# Patient Record
Sex: Female | Born: 1940 | Race: White | Hispanic: No | Smoking: Former smoker
Health system: Southern US, Community
[De-identification: ages and names within clinical notes are randomized; demographics above are authoritative.]

## PROBLEM LIST (undated history)

## (undated) ENCOUNTER — Emergency Department (HOSPITAL_COMMUNITY): Payer: Medicare HMO | Source: Home / Self Care

## (undated) DIAGNOSIS — R112 Nausea with vomiting, unspecified: Secondary | ICD-10-CM

## (undated) DIAGNOSIS — I4891 Unspecified atrial fibrillation: Secondary | ICD-10-CM

## (undated) DIAGNOSIS — I5030 Unspecified diastolic (congestive) heart failure: Secondary | ICD-10-CM

## (undated) DIAGNOSIS — E782 Mixed hyperlipidemia: Secondary | ICD-10-CM

## (undated) DIAGNOSIS — Z9889 Other specified postprocedural states: Secondary | ICD-10-CM

## (undated) DIAGNOSIS — I10 Essential (primary) hypertension: Secondary | ICD-10-CM

## (undated) DIAGNOSIS — R51 Headache: Secondary | ICD-10-CM

## (undated) DIAGNOSIS — R519 Headache, unspecified: Secondary | ICD-10-CM

## (undated) DIAGNOSIS — R079 Chest pain, unspecified: Secondary | ICD-10-CM

## (undated) DIAGNOSIS — D649 Anemia, unspecified: Secondary | ICD-10-CM

## (undated) DIAGNOSIS — K219 Gastro-esophageal reflux disease without esophagitis: Secondary | ICD-10-CM

## (undated) DIAGNOSIS — C55 Malignant neoplasm of uterus, part unspecified: Secondary | ICD-10-CM

## (undated) DIAGNOSIS — K5790 Diverticulosis of intestine, part unspecified, without perforation or abscess without bleeding: Secondary | ICD-10-CM

## (undated) DIAGNOSIS — N186 End stage renal disease: Secondary | ICD-10-CM

## (undated) DIAGNOSIS — Z992 Dependence on renal dialysis: Secondary | ICD-10-CM

## (undated) DIAGNOSIS — G629 Polyneuropathy, unspecified: Secondary | ICD-10-CM

## (undated) HISTORY — DX: Anemia, unspecified: D64.9

## (undated) HISTORY — PX: APPENDECTOMY: SHX54

## (undated) HISTORY — PX: KNEE ARTHROSCOPY: SUR90

## (undated) HISTORY — PX: CHOLECYSTECTOMY: SHX55

## (undated) HISTORY — PX: TUBAL LIGATION: SHX77

## (undated) HISTORY — DX: Mixed hyperlipidemia: E78.2

## (undated) HISTORY — DX: Essential (primary) hypertension: I10

## (undated) HISTORY — PX: BREAST SURGERY: SHX581

## (undated) HISTORY — DX: Chest pain, unspecified: R07.9

## (undated) HISTORY — PX: ABDOMINAL HYSTERECTOMY: SHX81

## (undated) HISTORY — PX: COLONOSCOPY W/ BIOPSIES AND POLYPECTOMY: SHX1376

---

## 2005-03-07 ENCOUNTER — Ambulatory Visit (HOSPITAL_COMMUNITY): Admission: RE | Admit: 2005-03-07 | Discharge: 2005-03-07 | Payer: Self-pay | Admitting: Family Medicine

## 2005-03-24 ENCOUNTER — Other Ambulatory Visit: Admission: RE | Admit: 2005-03-24 | Discharge: 2005-03-24 | Payer: Self-pay | Admitting: Obstetrics and Gynecology

## 2005-04-03 ENCOUNTER — Ambulatory Visit (HOSPITAL_COMMUNITY): Admission: RE | Admit: 2005-04-03 | Discharge: 2005-04-03 | Payer: Self-pay | Admitting: Obstetrics and Gynecology

## 2005-04-13 ENCOUNTER — Encounter: Payer: Self-pay | Admitting: Obstetrics and Gynecology

## 2005-04-13 ENCOUNTER — Inpatient Hospital Stay (HOSPITAL_COMMUNITY): Admission: RE | Admit: 2005-04-13 | Discharge: 2005-04-15 | Payer: Self-pay | Admitting: Obstetrics and Gynecology

## 2005-08-02 ENCOUNTER — Emergency Department (HOSPITAL_COMMUNITY): Admission: EM | Admit: 2005-08-02 | Discharge: 2005-08-02 | Payer: Self-pay | Admitting: Emergency Medicine

## 2005-09-05 ENCOUNTER — Ambulatory Visit (HOSPITAL_COMMUNITY): Admission: RE | Admit: 2005-09-05 | Discharge: 2005-09-05 | Payer: Self-pay | Admitting: Orthopedic Surgery

## 2006-05-11 ENCOUNTER — Ambulatory Visit (HOSPITAL_COMMUNITY): Admission: RE | Admit: 2006-05-11 | Discharge: 2006-05-11 | Payer: Self-pay | Admitting: Obstetrics and Gynecology

## 2006-05-15 DIAGNOSIS — R079 Chest pain, unspecified: Secondary | ICD-10-CM

## 2006-05-15 HISTORY — DX: Chest pain, unspecified: R07.9

## 2006-05-29 ENCOUNTER — Observation Stay (HOSPITAL_COMMUNITY): Admission: EM | Admit: 2006-05-29 | Discharge: 2006-05-29 | Payer: Self-pay | Admitting: *Deleted

## 2007-09-20 ENCOUNTER — Ambulatory Visit (HOSPITAL_COMMUNITY): Admission: RE | Admit: 2007-09-20 | Discharge: 2007-09-20 | Payer: Self-pay | Admitting: Obstetrics and Gynecology

## 2007-10-01 ENCOUNTER — Other Ambulatory Visit: Admission: RE | Admit: 2007-10-01 | Discharge: 2007-10-01 | Payer: Self-pay | Admitting: Obstetrics and Gynecology

## 2008-10-21 ENCOUNTER — Ambulatory Visit (HOSPITAL_COMMUNITY): Admission: RE | Admit: 2008-10-21 | Discharge: 2008-10-21 | Payer: Self-pay | Admitting: Obstetrics and Gynecology

## 2010-05-27 DIAGNOSIS — C55 Malignant neoplasm of uterus, part unspecified: Secondary | ICD-10-CM | POA: Insufficient documentation

## 2010-05-27 DIAGNOSIS — K219 Gastro-esophageal reflux disease without esophagitis: Secondary | ICD-10-CM | POA: Insufficient documentation

## 2010-12-23 NOTE — H&P (Signed)
Sabrina Beck, Sabrina Beck NO.:  [id number]   MEDICAL RECORD NO.:  [id number]          PATIENT TYPE:  INP   LOCATION:  6529                         FACILITY:  [place]   PHYSICIAN:  Broadus John, MD DATE OF BIRTH:  08/28/1940   DATE OF ADMISSION:  05/29/2006  DATE OF DISCHARGE:                                HISTORY & PHYSICAL   Sabrina Beck is a 70 year old white women who is transferred from [facility] to [facility] for further evaluation of chest pain.   The patient, who has no past history of documented coronary artery disease,  presented to the emergency room at [facility] and was then transferred here  to [facility] with a history of chest pain which began approximately 24  hours ago. It awoke her from sleep. The chest pain is described as a  pressure in her upper substernal region. It does not radiate. It has been  associated with slight nausea but no diaphoresis or dyspnea. There are no  exacerbating or ameliorating factors. It appears not to be related to  position, activity, meals or respirations. It has continued unabated, though  in a low-grade fashion, throughout the course of the day. She has  experienced several briefer, milder episodes of this type of chest pain in  recent weeks, and reportedly underwent stress testing by Dr. Mathis Bud  approximately a week ago. The results are not noted at this time. Today's  episode is the longest and most intense of the episodes which she has  experienced.   As noted, the patient has no past history of cardiac disease, including no  history of chest pain, myocardial infarction, coronary artery disease,  congestive heart failure, or arrhythmia. She has a number of risk factors  for coronary artery disease including hypertension, dyslipidemia, and a  family history of coronary artery disease (her father suffered a myocardial  infarction at age 41). The patient has no history of diabetes mellitus  or  smoking.   She has a history of gastroesophageal reflux. In addition, she is status  post hysterectomy for endometrial cancer.   The patient's past medical history is otherwise unremarkable.   ALLERGIES:  None.   MEDICATIONS:  Nexium, Cozaar, enalapril, metoprolol, TriCor, and aspirin.   OPERATIONS:  Hysterectomy (as described above), cholecystectomy.   SOCIAL HISTORY:  The patient is widowed. She lives alone. She works in a  [employer]. She does not smoke cigarettes (as described above), and  she does not consume alcohol.   FAMILY HISTORY:  Notable for early coronary artery disease (father at age  68).   REVIEW OF SYSTEMS:  Reveals no new problems related to her head, eyes, ears,  nose, mouth, throat or lungs; gastrointestinal system; genitourinary system  or extremities. There is history of neurologic or psychiatric disorder.  There is no history of fevers, chills or weight loss.   PHYSICAL EXAMINATION:  Blood pressure 141/56. Pulse 71 and regular.  Respirations 16. Temperature 97.9.  The patient was an older white woman in no discomfort. She was alert,  oriented, appropriate and responsive.  HEENT:  Were normal. The neck was thyromegaly or adenopathy. Carotid pulses  were palpable bilaterally without bruits.  CARDIAC EXAMINATION:  Revealed a normal S1 and S2. There was no S3, S4,  murmur, rub, or click. Cardiac rhythm was regular. No chest wall tenderness  was noted.  LUNGS:  Clear.  ABDOMEN:  Was soft and nontender. There was no mass, hepatosplenomegaly,  bruit, distention, rebound, guarding, or rigidity. Bowel sounds were normal.  BREASTS, PELVIC, AND RECTAL EXAMINATIONS:  Were not performed as they were  not pertinent to the reason for acute care hospitalization.  EXTREMITIES:  Were without edema, deviation or deformity. Radial and  dorsalis pedis pulses were palpable bilaterally.  Brief screening neurological survey was unremarkable.   Electrocardiogram  was normal. The chest radiographs, according to the  radiologist, demonstrated no evidence of acute cardiopulmonary disease. The  initial set of cardiac markers revealed a myoglobin of 155, CK-MB 4.6, and  troponin less than 0.05. Potassium 3.9, BUN 24, and creatinine 1.1. White  count was 8.7 with a hemoglobin of 10.5 and hematocrit of 30.3.   [place] OF DICTATION - DOCUMENT [id number]      Broadus John, MD  Electronically Signed     MSC/MEDQ  D:  05/29/2006  T:  05/29/2006  Job:  [id number]

## 2010-12-23 NOTE — H&P (Signed)
NAME:  Sabrina Beck, Sabrina Beck                ACCOUNT NO.:  [id number]   MEDICAL RECORD NO.:  [id number]          PATIENT TYPE:  AMB   LOCATION:  DAY                           FACILITY:  [facility]   PHYSICIAN:  Jonnie Kind, M.D. DATE OF BIRTH:  [birth year]   DATE OF ADMISSION:  DATE OF DISCHARGE:  LH                                HISTORY & PHYSICAL   ADMISSION DIAGNOSIS:  Endometrial cancer, FIGO grade 1.   HISTORY OF PRESENT ILLNESS:  Postmenopausal 70 year old female who is  admitted for a TAH/BSO and pelvic washings for diagnosis of endometrial FIGO  grade 1 endometrial carcinoma.  Endometrial biopsy was performed on March 24, 2005 with microscopic evaluation showing sufficient atypia to raise the  question of atypical hyperplasia versus well-differentiated carcinoma.  Second opinion consultation confirms the diagnosis of well-differentiated  carcinoma.  CT scan of the abdomen and pelvis shows no lymphadenopathy,  normal size uterus, no adnexal abnormalities.  Additional laboratory  evaluations notable for hemoglobin 12, hematocrit 36, platelets 226,000, BUN  19, creatinine 0.9 with normal liver function tests.  Albumin is low at 3.2.  Interestingly urinalysis shows significant proteinuria.  It was noted that  she had made proteinuria, 3+  proteinuria noted early this year and she had  a history of protein in urine which she related had been worked up and no  pathologic abnormality identified.  The results showed approximately  proteinuria was impressive with total protein increased to 9867 g per 24  hours.  We will once again consider this evaluation but need to proceed with  hysterectomy regarding the ovarian cancer.   PAST MEDICAL HISTORY:  Benign.   PAST SURGICAL HISTORY:  1.  Tubal ligation in 1981 with cervical polypectomy.  2.  Breast biopsy x2, Dr. Romona Curls.  3.  Cholecystectomy, Dr. Romona Curls.   INJURIES:  None.   MEDICATIONS:  Nexium.   PHYSICAL EXAMINATION:  VITAL SIGNS:   Height 5 feet 4 inches, weight 190,  blood pressure 162/80.  GENERAL:  Healthy-appearing postmenopausal Caucasian female, alert and  oriented x3.  HEENT:  Pupils are equal, round and reactive.  NECK:  Supple.  CHEST:  Clear to auscultation.  ABDOMEN:  Nontender without masses or adenopathy.  GYN:  CT scan:  See HPI.  Pap smear Class I.  Uterus within normal limits.  Adnexa nontender.  EXTREMITIES:  Without clubbing or edema at this time.   PLAN:  Left total abdominal hysterectomy and bilateral salpingo-  oophorectomy, pelvic washings, April 13, 2005.      Jonnie Kind, M.D.  Electronically Signed     JVF/MEDQ  D:  04/12/2005  T:  04/13/2005  Job:  [id number]   cc:   Bonne Dolores, M.D.  [address] [place] [address]  Fax: [phone]

## 2010-12-23 NOTE — Op Note (Signed)
NAMENECIE, GRIDLEY                ACCOUNT NO.:  [id number]   MEDICAL RECORD NO.:  [id number]          PATIENT TYPE:  INP   LOCATION:  [id number]                          FACILITY:  [facility]   PHYSICIAN:  Jonnie Kind, M.D. DATE OF BIRTH:  [birth year]   DATE OF PROCEDURE:  04/13/2005  DATE OF DISCHARGE:  04/15/2005                                 OPERATIVE REPORT   PREOPERATIVE DIAGNOSIS:  Endometrial cancer, FIGO, grade 1.   POSTOPERATIVE DIAGNOSIS:  Endometrial cancer, FIGO, grade 1.   PROCEDURE:  Total abdominal hysterectomy, bilateral salpingo-oophorectomy.   SURGEON:  Jonnie Kind, M.D.   ASSISTANTPhilipp Ovens, CST. Whitt, CST.   ANESTHESIA:  General, Idacavage, C.R.N.A.   COMPLICATIONS:  None.   FINDINGS:  Small uterus, tubes and ovaries. Normal-appearing pelvic  structures.   DETAILS OF PROCEDURE:  The patient was taken operating room, prepped and  draped in the usual fashion for lower abdominal surgery. A vertical midline  lower abdominal incision was performed, with easy entry into the peritoneal  cavity through a midline incision in the lower abdomen. Bowel was mobilized  and pelvis identified. Pelvic washings were taken from the surfaces around  the uterus, tubes and ovaries. The omentum upper abdomen were inspected, and  there was no abnormality. Palpation of the side wall showed no suspicious  adenopathy. Bowel was packed away and attention to the pelvis then  performed. Kelly clamps were placed across each round ligament and tube and  ovary complex. Round ligaments were taken down on either side by clamping,  cutting and suture ligating. The bladder flap was developed anteriorly. The  infundibular pelvic ligament was isolated on either side, being careful to  identify the ureter from a retroperitoneal technique, then the IP ligaments  were clamped, cut and suture ligated on either side. The broad ligament was  skeletonized down to the uterine vessels and a curved  Heaney clamp used  crossclamp uterine vessels, with transection of 0 chromic suture ligature.  The upper and lower cardinal ligaments were serially clamped, cut and suture  ligated using a straight Heaney clamp, knife dissection and suture ligature.  Upon reaching the level of the cervical vaginal angle with the bladder flap  having been pushed down, we were able to enter the anterior peritoneum,  amputate the cervix off the vaginal cuff, then close the cuff with Aldridge  stitches at each lateral vaginal angle and then oversew the middle portion  of the cuff, resulting in good cuff closure. Hemostasis was satisfactory.  Peritoneum was loosely reapproximated with the cuff x1 suture, and washing  and irrigation of the abdomen showed no continued bleeding. Sponge and  needle counts were correct. Anterior  peritoneum closed with 2-0 chromic __ followed by continuous running 0 PDS  suture to close the fascia. Subcutaneous tissues were approximated 2-0  chromic. Staple closure of skin completed the procedure. The patient  tolerated the procedure well and going to the recovery room in stable  condition.      Jonnie Kind, M.D.  Electronically Signed     JVF/MEDQ  D:  04/25/2005  T:  04/26/2005  Job:  [id number]   cc:   Bonne Dolores, M.D.  Fax: [phone]

## 2010-12-23 NOTE — Op Note (Signed)
Sabrina Beck, Sabrina Beck                ACCOUNT NO.:  [id number]   MEDICAL RECORD NO.:  [id number]          PATIENT TYPE:  INP   LOCATION:  [id number]                          FACILITY:  [facility]   PHYSICIAN:  Jonnie Kind, M.D. DATE OF BIRTH:  07-20-1941   DATE OF PROCEDURE:  04/13/2005  DATE OF DISCHARGE:  04/15/2005                                 OPERATIVE REPORT   PREOPERATIVE DIAGNOSIS:  Endometrial cancer, FIGO grade 1.   POSTOPERATIVE DIAGNOSIS:  Endometrial cancer, FIGO grade 1.   PROCEDURE:  Total abdominal hysterectomy with bilateral salpingo-  oophorectomy, pelvic washings.   SURGEON:  Jonnie Kind, M.D.   ASSISTANTPhilipp Ovens, CST.   ANESTHESIA:  Idacavage, CRNA.   COMPLICATIONS:  None.   FINDINGS:  Small uterus. No evidence of intra-abdominal abnormality. No  pelvic nodularity identifiable.   DETAILS OF PROCEDURE:  The patient was taken to the operating room, prepped  and draped for lower abdominal surgery. Lower abdominal midline incision was  made. Foley catheter had been placed and vaginal prepping performed. Midline  lower abdominal incision with easy entry into the peritoneal cavity and  pelvis washings performed, promptly suctioned. These were placed to the  side. The Balfour retractor was positioned, bowel packed away, and attention  to the pelvis directed. Round ligaments were easily taken down bilaterally,  and infundibulopelvic ligament skeletonized, clamped, cut, and suture  ligated. Bladder flap was developed anteriorly. Uterine vessels on either  side were clamped, cut, and suture ligated, first the infundibulopelvic  ligaments on either side being careful to avoid the ureters, then the broad  ligament serially clamped, cut and suture ligated followed by upper and  lower cardinal ligaments. Upon reaching the level of the cervix, a curved  Heaney clamp was placed beneath the cervix partly on either side, clamped,  cut and suture ligated. The remainder  part of the cervical attachments were  released and the cuff closed the rest of the way using interrupted figure-of-  eight suture.  Hemostasis was quite good. Pelvis was reperitonealized. Bowel was  repositioned. [place] equipment removed. Anterior peritoneum closed with 2-0  chromic, the fascia closed with 0 Vicryl. Subcutaneous tissues closed with 2-  0 plain, and staple closure of the skin completed the procedure. Sponge and  needle counts were correct.      Jonnie Kind, M.D.  Electronically Signed     JVF/MEDQ  D:  04/27/2005  T:  04/27/2005  Job:  [id number]

## 2010-12-23 NOTE — Consult Note (Signed)
NAMESHOSHANNA, CORBAN                ACCOUNT NO.:  [id number]   MEDICAL RECORD NO.:  [id number]          PATIENT TYPE:  INP   LOCATION:  6529                         FACILITY:  [facility]   PHYSICIAN:  Tory Emerald. Benson Norway, MD    DATE OF BIRTH:  1940/10/07   DATE OF CONSULTATION:  DATE OF DISCHARGE:  05/29/2006                                   CONSULTATION   REFERRING PHYSICIAN:  Ulice Dash R. Einar Gip, MD   REASON FOR CONSULTATION:  Chest pain.   HISTORY OF PRESENT ILLNESS:  This is a 70 year old female with a past  medical history of hypertension, endometrial cancer status post hysterectomy  and status post cholecystectomy who is admitted to the hospital with  complaints of chest pressure.  The patient states that she had an acute  onset of chest pain which is described as a squeezing sensation that became  more frequent previously in the past for approximately the past year.  She  had fleeting episodes of this squeezing type of sensation that was not  associated with exertion or any types of p.o. intake subsequently.  She did  not pay any attention until this past Monday where it became more frequent  and sustained.  She subsequently presented to [facility] and was  transferred over to [facility] for further evaluation and  treatment.  The patient at this time did wake her up from sleep.  However,  she has had nocturnal awakenings in the past.  The patient states having a  prior history of gastroesophageal reflux disease where it manifested a  burning type of sensation.  She was started on Nexium with excellent  efficacy and she denies that there is any correlation between the  indigestion that she experienced prior to Nexium and her squeezing type of  sensation.  She underwent a cardiac evaluation and the Myoview is negative  for any evidence of myocardial infarction and additionally her cardiac  enzymes are negative for any abnormalities.  In the past she was evaluated  by Dr.  Mathis Bud where she underwent a stress test approximately a week ago  and apparently that is a negative result.   PAST MEDICAL HISTORY:  As stated above.   PAST SURGICAL HISTORY:  As stated above.   ALLERGIES:  No known drug allergies.   SOCIAL HISTORY:  The patient is widowed.  Lives alone.  No tobacco, alcohol  or illicit drug use.   FAMILY HISTORY:  Significant for coronary artery disease.   REVIEW OF SYSTEMS:  Negative for dizziness, shortness of breath, fevers,  chills, arthralgia, dysuria, dysphagia, dizziness, tinnitus, rashes or new  neurological or any neurological episodes.   PHYSICAL EXAMINATION:  Vital signs:  Blood pressure is 122/65.  Heart rate  is 73.  Respirations 20.  Pulse ox is 97%.  Temperature is 97.1.  Generally,  the patient is in no acute distress.  Alert and oriented.  HEENT:  Normocephalic, atraumatic.  Extraocular muscles intact.  Pupils equal, round  and reactive to light.  Neck is supple with no lymphadenopathy.  Lungs are  clear to  auscultation bilaterally.  Cardiovascular:  Regular rate and  rhythm.  Abdomen is obese, soft, nontender, nondistended.  No  hepatosplenomegaly.  Extremities:  No clubbing, cyanosis or edema.   MEDICATIONS:  1. Nexium.  2. Cozaar.  3. Enalapril.  4. Metoprolol.  5. Tricor.  6. Aspirin.   IMPRESSION:  1. Noncardiac chest pain.  2. Gastroesophageal reflux disease.  3. History of endometrial cancer.  4. Abnormal liver enzymes.  After evaluation of the patient it appears that she had noncardiac chest  pain and this can be from a GI source.  The patient does have relief with  Nexium at this time for her gastroesophageal reflux symptoms.  It may be  that there is some further irritation of acid presenting as chest pain.  It  would be prudent to start her on Nexium b.i.d. for approximately 2 weeks and  I will follow up with her in the office.  If her symptoms are resolved then  no further workup is necessary and she can  be maintained on Nexium and most  likely weaned down to once per day.  If her symptoms are not resolved,  further evaluation with an EDG and __________ will be necessary and pending  the results, a manometry may be performed.  Additionally, the patient is  noted to have a mildly elevated ALT.  I suspect that this is fatty lipid  disease.  However, further evaluation will be performed and this can be done  as an outpatient.  At this time she does not have any further chest pain.  I  will leave her discharge up to Dr. Einar Gip.   PLAN:  Will provide patient Nexium 40 mg p.o. b.i.d. and to follow up with  Dr. Benson Norway in the office in 2 weeks.      Tory Emerald Benson Norway, MD  Electronically Signed     PDH/MEDQ  D:  05/29/2006  T:  05/30/2006  Job:  [id number]   cc:   Eden Lathe. Einar Gip, MD

## 2010-12-23 NOTE — Discharge Summary (Signed)
NAMEMILKA, BUZZO                ACCOUNT NO.:  [id number]   MEDICAL RECORD NO.:  [id number]          PATIENT TYPE:  INP   LOCATION:  [id number]                          FACILITY:  [facility]   PHYSICIAN:  Jonnie Kind, M.D. DATE OF BIRTH:  10-21-1940   DATE OF ADMISSION:  04/13/2005  DATE OF DISCHARGE:  09/09/2006LH                                 DISCHARGE SUMMARY   PROCEDURES:  Total abdominal hysterectomy (68.4).  Bilateral salpingo-oophorectomy (65.61).   ADMISSION DIAGNOSES:  Endometrial cancer FIGO grade 1.   DISCHARGE DIAGNOSES:  Endometrial cancer FIGO grade 1.   PROCEDURES:  1.  Total abdominal hysterectomy bilateral salpingo-oophorectomy.  2.  Peritoneal washings.   HOSPITAL COURSE:  Sabrina Beck was admitted having had an outpatient biopsy  indicating well differentiating endometrial carcinoma.  She has a benign  medical history with history of tubal ligation and breast biopsy x2 and  cholecystectomy.  The patient was admitted and underwent surgery in  straightforward fashion on April 14, 2005.  Postoperatively, the patient  did well, was out of bed x2 the same day.  On September 9, she was doing  well, ambulatory, tolerating regular diet with stable postoperative course  and discharged home.   DISCHARGE MEDICATIONS:  1.  Tylox 1 q.4h p.r.n. pain.  2.  Nexium 1 p.o. daily for heartburn.  3.  Iron supplement one daily for anemia.   PLAN:  Follow up in one week.   ADDENDUM:  Pathology report showed a 70 g uterus with only 2 mm invasion at  a place where the myometrium was 1.3 cm thick.  There was no lymphatic  neurovascular invasion and no cervical involvement.  Peritoneal services  were negative and peritoneal washings were negative.  No lymph nodes were  resected.   The patient will be followed up for routine postop care with our office.     Jonnie Kind, M.D.  Electronically Signed    JVF/MEDQ  D:  05/02/2005  T:  05/03/2005  Job:  [id number]

## 2010-12-23 NOTE — Discharge Summary (Signed)
NAMENORALBA, ROSENCRANTZ                ACCOUNT NO.:  [id number]   MEDICAL RECORD NO.:  [id number]          PATIENT TYPE:  INP   LOCATION:  6529                         FACILITY:  [facility]   PHYSICIAN:  Leslye Peer, MD       DATE OF BIRTH:  17-Jun-1941   DATE OF ADMISSION:  05/29/2006  DATE OF DISCHARGE:  05/29/2006                               DISCHARGE SUMMARY   DISCHARGE DIAGNOSES:  1. Chest pain negative myocardial infarction, probably      gastrointestinal.  2. Hypertension.  3. Dyslipidemia.  4. Gastroesophageal reflux disease.  5. History of endometrial cancer with hysterectomy.  6. History of anemia.   DISCHARGE CONDITION:  Improved.   PROCEDURES:  None.   CONSULTATIONS:  GI with Dr. Carol Ada.   DISCHARGE MEDICATIONS:  1. Nexium 40 mg twice a day for 2 weeks.  2. Cozaar 50 mg daily.  3. Enalapril 10 mg daily.  4. Metoprolol tartrate 25 mg half a tablet twice a day.  5. Tricor 145 mg daily.  6. Aspirin 81 mg daily.  7. Vitamin C 1000 mg daily.  8. Hematinic Plus 1 daily.  9. Glucosamine chondroitin 2 tablets daily.   DISCHARGE INSTRUCTIONS:  Low-fat, low-salt diet.  Increase activity  slowly.  See Dr. Mathis Bud; the office will call with date and time.   HISTORY OF PRESENT ILLNESS:  A 70 year old female was transferred from  [facility] to [facility] on May 29, 2006 and admitted by  Dr. Chana Bode, on call for Dr. Mathis Bud secondary to chest pain.  The  chest pain awakened her from sleep, and it began approximately 24 hours  prior to the admission.  It was described as pressure in her upper  substernal region without radiation.  Has been associated with slight  nausea but no diaphoresis or dyspnea.  No exacerbating factors.  It had  continued unabated in a low-grade fashion throughout the course of the  day.  She had experienced several milder episodes in recent weeks and  underwent stress testing by Dr. Mathis Bud a week prior to the admission.  We did get  those results which were negative for ischemia.  The episode  that brought her to the hospital was the most intense episode.   The patient has no prior history of coronary disease.  She does have  risk factors, hypertension, dyslipidemia, and family history of coronary  disease.  Her father suffered an MI at age 96.  She does have  gastroesophageal reflux disease and a history of hysterectomy and  endometrial cancer.   ALLERGIES:  None.   OUTPATIENT MEDICATIONS:  Nexium, Cozaar, enalapril, metoprolol, Tricor,  and aspirin.   Family history, social history, review of systems, see history and  physical.   PHYSICAL EXAMINATION AT DISCHARGE:  Blood pressure 138/53, pulse 84 and  regular, temperature 98.5.  HEART:  Regular rate and rhythm, no gallop or murmur.  LUNGS:  Clear.  ABDOMEN:  Soft, nontender, positive bowel sounds.  HEENT:  Sinus rhythm.   LABORATORY DATA:  Reticulocyte count 1.3, RBC is 3.47, pro time 13.8,  INR  1, PTT 29, magnesium 2.  Cardiac enzymes:  CK's were elevated at 247  and 257, but MB's were negative at 3.4 and 2.9, Troponin-I was less than  0.01.  Total cholesterol 168, triglycerides 144, HDL 37, and LDL 102.  TSH 3.67, iron 71, TIBC 312, iron saturation 23, UIBC 241, B12 712,  ferritin 355, erythropoietin 7.6.   LABORATORY DATA AT :  Myoglobin was 155, MB 4.6, troponin less  than 0.05, BUN 24, creatinine 1.1, and potassium was 3.9.  Hemoglobin  10.5, hematocrit 30.   Chest x-ray:  No evidence of acute cardiopulmonary disease, and EKG was  normal.   HOSPITAL COURSE:  Ms. Darpino was transferred from [facility] due to  chest pain on May 29, 2006 and admitted by Dr. Ethelle Lyon on call  for Dr. Mathis Bud.  The patient had recently had a stress test with Dr.  Mathis Bud, Myoview, and it was negative for ischemia, completely normal  with normal EF.  Enzymes were negative.  CK's were elevated.  The  patient had complained of some dark stools, but  she was on iron, unable  to do a Hemoccult at the time.   Dr. Benson Norway was consulted as it was felt more GI related than cardiac.  He  saw her and increased her Nexium to twice a day which we did at  discharge, and she would follow up with Dr. Benson Norway in the office. Dr.  Einar Gip saw her and discharge her on May 29, 2006.      Otilio Carpen. Dorene Ar, N.P.    ______________________________  Leslye Peer, MD    LRI/MEDQ  D:  07/13/2006  T:  07/14/2006  Job:  [id number]   cc:   Tory Emerald. Benson Norway, MD  Leslye Peer, MD

## 2010-12-23 NOTE — H&P (Signed)
NAMEMILEYA, Sabrina Beck                ACCOUNT NO.:  [id number]   MEDICAL RECORD NO.:  [id number]          PATIENT TYPE:  INP   LOCATION:  6529                         FACILITY:  [facility]   PHYSICIAN:  Broadus John, MD DATE OF BIRTH:  [birth year]   DATE OF ADMISSION:  05/29/2006  DATE OF DISCHARGE:                                HISTORY & PHYSICAL   PLEASE COMBINE WITH FIRST HALF OF DICTATION - DOCUMENT [phone]   LABORATORY STUDIES:  The electrocardiogram was normal.  The chest radiograph, according to the  radiologist, demonstrated no evidence of acute cardiopulmonary disease.  Her  myoglobin was 155, CK-MB 4.6, and troponin less than 0.05.  Potassium was  3.9, BUN 24, and creatinine 1.1.  White count was 8.7 with a hemoglobin of  10.5 and a hematocrit of 30.3.  The remaining studies were pending at the  time of this dictation.   IMPRESSION:  1. Chest pain; rule out unstable angina.  2. Hypertension.  3. Dyslipidemia.  4. Gastroesophageal reflux.  5. Status post hysterectomy for endometrial cancer.  6. Anemia.   PLAN:  1. Telemetry.  2. Serial cardiac enzymes.  3. Aspirin.  4. Intravenous heparin.  5. Intravenous nitroglycerin.  6. Metoprolol.  7. Fasting lipid profile.  8. Further measures per Dr. Mathis Bud.      Broadus John, MD  Electronically Signed     MSC/MEDQ  D:  05/29/2006  T:  05/29/2006  Job:  [id number]   cc:   Leslye Peer, MD

## 2011-03-13 ENCOUNTER — Encounter (HOSPITAL_COMMUNITY): Payer: Medicare PPO | Attending: Nephrology

## 2011-03-13 VITALS — BP 111/69 | HR 80

## 2011-03-13 DIAGNOSIS — N289 Disorder of kidney and ureter, unspecified: Secondary | ICD-10-CM

## 2011-03-13 DIAGNOSIS — D649 Anemia, unspecified: Secondary | ICD-10-CM | POA: Insufficient documentation

## 2011-03-13 MED ORDER — EPOETIN ALFA 10000 UNIT/ML IJ SOLN
2000.0000 [IU] | Freq: Once | INTRAMUSCULAR | Status: AC
  Administered 2011-03-13: 2000 [IU] via SUBCUTANEOUS

## 2011-03-13 MED ORDER — EPOETIN ALFA 10000 UNIT/ML IJ SOLN
INTRAMUSCULAR | Status: AC
  Filled 2011-03-13: qty 1

## 2011-03-13 NOTE — Progress Notes (Signed)
Procrit 2000units subq left lower abdomen. Tolerated well.

## 2011-03-27 ENCOUNTER — Encounter (HOSPITAL_COMMUNITY): Payer: Medicare PPO

## 2011-03-27 ENCOUNTER — Encounter (HOSPITAL_COMMUNITY): Payer: Self-pay

## 2011-03-27 VITALS — BP 129/71 | HR 93 | Temp 98.1°F

## 2011-03-27 DIAGNOSIS — D649 Anemia, unspecified: Secondary | ICD-10-CM

## 2011-03-27 HISTORY — DX: Anemia, unspecified: D64.9

## 2011-03-27 MED ORDER — EPOETIN ALFA 10000 UNIT/ML IJ SOLN: 2000.0000 [IU] | INTRAMUSCULAR | Status: DC

## 2011-03-27 MED ORDER — EPOETIN ALFA 10000 UNIT/ML IJ SOLN
INTRAMUSCULAR | Status: AC
  Filled 2011-03-27: qty 1

## 2011-03-27 MED ORDER — EPOETIN ALFA 10000 UNIT/ML IJ SOLN
2000.0000 [IU] | Freq: Once | INTRAMUSCULAR | Status: AC
  Administered 2011-03-27: 2000 [IU] via SUBCUTANEOUS

## 2011-03-27 NOTE — Progress Notes (Signed)
VSS. Procrit  2000 units given subcutaneous to lower left abd.  Tolerated well.

## 2011-04-11 ENCOUNTER — Encounter (HOSPITAL_COMMUNITY): Payer: Medicare PPO | Attending: Nephrology

## 2011-04-11 VITALS — BP 122/70 | HR 84

## 2011-04-11 DIAGNOSIS — D649 Anemia, unspecified: Secondary | ICD-10-CM

## 2011-04-11 LAB — RENAL FUNCTION PANEL
Albumin: 3.5 g/dL (ref 3.5–5.2)
BUN: 43 mg/dL — ABNORMAL HIGH (ref 6–23)
Calcium: 8.6 mg/dL (ref 8.4–10.5)
Creatinine, Ser: 2.59 mg/dL — ABNORMAL HIGH (ref 0.50–1.10)
GFR calc non Af Amer: 18 mL/min — ABNORMAL LOW (ref >60)

## 2011-04-11 MED ORDER — EPOETIN ALFA 10000 UNIT/ML IJ SOLN
INTRAMUSCULAR | Status: AC
  Administered 2011-04-11: 2000 [IU] via INTRAVENOUS
  Filled 2011-04-11: qty 1

## 2011-04-11 MED ORDER — EPOETIN ALFA 10000 UNIT/ML IJ SOLN
2000.0000 [IU] | Freq: Once | INTRAMUSCULAR | Status: AC
  Administered 2011-04-11: 2000 [IU] via INTRAVENOUS

## 2011-04-11 NOTE — Progress Notes (Signed)
Labs faxed to Dr.Befekadu at 1542pm today.

## 2011-04-25 ENCOUNTER — Encounter (HOSPITAL_COMMUNITY): Payer: Medicare PPO

## 2011-04-25 VITALS — BP 134/89 | HR 94

## 2011-04-25 DIAGNOSIS — D649 Anemia, unspecified: Secondary | ICD-10-CM

## 2011-04-25 MED ORDER — EPOETIN ALFA 10000 UNIT/ML IJ SOLN
INTRAMUSCULAR | Status: AC
  Administered 2011-04-25: 2000 [IU] via SUBCUTANEOUS
  Filled 2011-04-25: qty 1

## 2011-04-25 MED ORDER — EPOETIN ALFA 10000 UNIT/ML IJ SOLN
2000.0000 [IU] | Freq: Once | INTRAMUSCULAR | Status: AC
  Administered 2011-04-25: 2000 [IU] via SUBCUTANEOUS

## 2011-04-25 NOTE — Progress Notes (Signed)
Sabrina Beck presents today for injection per MD orders. Procrit 2000 units administered SQ in left Abdomen. Administration without incident. Patient tolerated well.

## 2011-05-09 ENCOUNTER — Encounter (HOSPITAL_COMMUNITY): Payer: Medicare PPO | Attending: Nephrology

## 2011-05-09 VITALS — BP 114/76 | HR 86

## 2011-05-09 DIAGNOSIS — D649 Anemia, unspecified: Secondary | ICD-10-CM

## 2011-05-09 DIAGNOSIS — N289 Disorder of kidney and ureter, unspecified: Secondary | ICD-10-CM

## 2011-05-09 DIAGNOSIS — N184 Chronic kidney disease, stage 4 (severe): Secondary | ICD-10-CM | POA: Insufficient documentation

## 2011-05-09 DIAGNOSIS — N189 Chronic kidney disease, unspecified: Secondary | ICD-10-CM | POA: Insufficient documentation

## 2011-05-09 LAB — RENAL FUNCTION PANEL
Albumin: 3.5 g/dL (ref 3.5–5.2)
BUN: 46 mg/dL — ABNORMAL HIGH (ref 6–23)
Chloride: 105 mEq/L (ref 96–112)
Potassium: 4.3 mEq/L (ref 3.5–5.1)
Sodium: 137 mEq/L (ref 135–145)

## 2011-05-09 LAB — HEMOGLOBIN AND HEMATOCRIT, BLOOD: Hemoglobin: 9.8 g/dL — ABNORMAL LOW (ref 12.0–15.0)

## 2011-05-09 MED ORDER — EPOETIN ALFA 10000 UNIT/ML IJ SOLN
2000.0000 [IU] | Freq: Once | INTRAMUSCULAR | Status: AC
  Administered 2011-05-09: 2000 [IU] via SUBCUTANEOUS

## 2011-05-09 MED ORDER — EPOETIN ALFA 10000 UNIT/ML IJ SOLN
INTRAMUSCULAR | Status: AC
  Administered 2011-05-09: 2000 [IU] via SUBCUTANEOUS
  Filled 2011-05-09: qty 1

## 2011-05-09 NOTE — Progress Notes (Signed)
Sabrina Beck presents today for injection per MD orders. Procrit 2,000 units administered SQ in left Abdomen. Administration without incident. Patient tolerated well.

## 2011-05-23 ENCOUNTER — Encounter (HOSPITAL_COMMUNITY): Payer: Medicare PPO

## 2011-05-23 DIAGNOSIS — N184 Chronic kidney disease, stage 4 (severe): Secondary | ICD-10-CM

## 2011-05-23 DIAGNOSIS — D649 Anemia, unspecified: Secondary | ICD-10-CM

## 2011-05-23 MED ORDER — EPOETIN ALFA 10000 UNIT/ML IJ SOLN
2000.0000 [IU] | Freq: Once | INTRAMUSCULAR | Status: AC
  Administered 2011-05-23: 2000 [IU] via SUBCUTANEOUS

## 2011-05-23 MED ORDER — EPOETIN ALFA 10000 UNIT/ML IJ SOLN
INTRAMUSCULAR | Status: AC
  Administered 2011-05-23: 2000 [IU] via SUBCUTANEOUS
  Filled 2011-05-23: qty 1

## 2011-05-23 NOTE — Progress Notes (Signed)
Sabrina Beck presents today for injection per MD orders. Procrit 2000 units administered SQ in left Abdomen. Administration without incident. Patient tolerated well.

## 2011-06-06 ENCOUNTER — Encounter (HOSPITAL_BASED_OUTPATIENT_CLINIC_OR_DEPARTMENT_OTHER): Payer: Medicare PPO

## 2011-06-06 VITALS — BP 129/76 | HR 78

## 2011-06-06 DIAGNOSIS — N189 Chronic kidney disease, unspecified: Secondary | ICD-10-CM

## 2011-06-06 DIAGNOSIS — D649 Anemia, unspecified: Secondary | ICD-10-CM

## 2011-06-06 LAB — RENAL FUNCTION PANEL
BUN: 52 mg/dL — ABNORMAL HIGH (ref 6–23)
CO2: 23 mEq/L (ref 19–32)
GFR calc Af Amer: 20 mL/min — ABNORMAL LOW (ref >90)
Glucose, Bld: 102 mg/dL — ABNORMAL HIGH (ref 70–99)
Potassium: 4.7 mEq/L (ref 3.5–5.1)
Sodium: 136 mEq/L (ref 135–145)

## 2011-06-06 MED ORDER — EPOETIN ALFA 10000 UNIT/ML IJ SOLN
INTRAMUSCULAR | Status: AC
  Administered 2011-06-06: 2000 [IU] via SUBCUTANEOUS
  Filled 2011-06-06: qty 1

## 2011-06-06 MED ORDER — EPOETIN ALFA 10000 UNIT/ML IJ SOLN
2000.0000 [IU] | Freq: Once | INTRAMUSCULAR | Status: AC
  Administered 2011-06-06: 2000 [IU] via SUBCUTANEOUS

## 2011-06-06 NOTE — Progress Notes (Signed)
Sabrina Beck presented for [employer]. Labs per MD order drawn via Peripheral Line 23 gauge needle inserted in rt ac. Lab drawn for renal panel and H&H. Procedure without incident.  Sabrina Beck presents today for injection per MD orders. Procrit 2000 units administered SQ in left Abdomen. Administration without incident. Patient tolerated well. Patient tolerated procedure well.

## 2011-06-20 ENCOUNTER — Encounter (HOSPITAL_COMMUNITY): Payer: Medicare PPO | Attending: Nephrology

## 2011-06-20 ENCOUNTER — Encounter (HOSPITAL_COMMUNITY): Payer: Medicare PPO

## 2011-06-20 DIAGNOSIS — D649 Anemia, unspecified: Secondary | ICD-10-CM

## 2011-06-20 MED ORDER — EPOETIN ALFA 10000 UNIT/ML IJ SOLN: 2000.0000 [IU] | Freq: Once | INTRAMUSCULAR | Status: DC

## 2011-06-20 MED ORDER — EPOETIN ALFA 10000 UNIT/ML IJ SOLN
INTRAMUSCULAR | Status: AC
  Administered 2011-06-20: 2000 [IU] via SUBCUTANEOUS
  Filled 2011-06-20: qty 1

## 2011-06-20 NOTE — Progress Notes (Signed)
Sabrina Beck presents today for injection per MD orders. Procrit 2000 units administered SQ in left Abdomen. Administration without incident. Patient tolerated well.

## 2011-07-04 ENCOUNTER — Encounter (INDEPENDENT_AMBULATORY_CARE_PROVIDER_SITE_OTHER): Payer: Medicare PPO

## 2011-07-04 DIAGNOSIS — D649 Anemia, unspecified: Secondary | ICD-10-CM

## 2011-07-04 LAB — RENAL FUNCTION PANEL
Albumin: 3.7 g/dL (ref 3.5–5.2)
BUN: 47 mg/dL — ABNORMAL HIGH (ref 6–23)
Creatinine, Ser: 2.68 mg/dL — ABNORMAL HIGH (ref 0.50–1.10)
GFR calc non Af Amer: 17 mL/min — ABNORMAL LOW (ref >90)
Phosphorus: 3.6 mg/dL (ref 2.3–4.6)

## 2011-07-04 LAB — HEMOGLOBIN AND HEMATOCRIT, BLOOD
HCT: 29 % — ABNORMAL LOW (ref 36.0–46.0)
Hemoglobin: 9.4 g/dL — ABNORMAL LOW (ref 12.0–15.0)

## 2011-07-04 MED ORDER — EPOETIN ALFA 10000 UNIT/ML IJ SOLN
INTRAMUSCULAR | Status: AC
  Filled 2011-07-04: qty 1

## 2011-07-04 MED ORDER — EPOETIN ALFA 10000 UNIT/ML IJ SOLN
2000.0000 [IU] | Freq: Once | INTRAMUSCULAR | Status: AC
  Administered 2011-07-04: 2000 [IU] via SUBCUTANEOUS

## 2011-07-04 NOTE — Progress Notes (Signed)
Sabrina Beck presents today for injection per MD orders. Procrit 2,000 units administered SQ in left Abdomen. Administration without incident. Patient tolerated well.

## 2011-07-06 ENCOUNTER — Telehealth (HOSPITAL_COMMUNITY): Payer: Self-pay | Admitting: Oncology

## 2011-07-06 ENCOUNTER — Other Ambulatory Visit (HOSPITAL_COMMUNITY): Payer: Self-pay | Admitting: Oncology

## 2011-07-18 ENCOUNTER — Ambulatory Visit (HOSPITAL_COMMUNITY): Payer: Medicare PPO

## 2011-07-24 ENCOUNTER — Telehealth (HOSPITAL_COMMUNITY): Payer: Self-pay | Admitting: Oncology

## 2011-08-04 ENCOUNTER — Encounter (HOSPITAL_COMMUNITY): Payer: Medicare Other | Attending: Nephrology

## 2011-08-04 DIAGNOSIS — D649 Anemia, unspecified: Secondary | ICD-10-CM | POA: Insufficient documentation

## 2011-09-21 ENCOUNTER — Encounter (HOSPITAL_COMMUNITY): Payer: Medicare Other | Attending: Nephrology

## 2011-09-21 DIAGNOSIS — D649 Anemia, unspecified: Secondary | ICD-10-CM

## 2011-09-21 MED ORDER — EPOETIN ALFA 10000 UNIT/ML IJ SOLN
6000.0000 [IU] | INTRAMUSCULAR | Status: DC
  Administered 2011-09-21: 6000 [IU] via INTRAVENOUS

## 2011-09-21 NOTE — Progress Notes (Signed)
Sabrina Beck presents today for injection per MD orders. Procrit 6,000units administered SQ in left Abdomen. Administration without incident. Patient tolerated well. Patient to get labs every 4 weeks, does not need them this shot but will need them with the next shot in two weeks, however, patient states that she sees Dr. Hinda Lenis in two weeks and they may draw labs then.  Patient instructed to inform us if they draw labs so we don't draw them again.  The concern is that insurance will not pay for the same labs so close.

## 2011-10-05 ENCOUNTER — Encounter (INDEPENDENT_AMBULATORY_CARE_PROVIDER_SITE_OTHER): Payer: Medicare Other

## 2011-10-05 VITALS — BP 117/76 | HR 84

## 2011-10-05 DIAGNOSIS — D649 Anemia, unspecified: Secondary | ICD-10-CM

## 2011-10-05 MED ORDER — EPOETIN ALFA 10000 UNIT/ML IJ SOLN
6000.0000 [IU] | Freq: Once | INTRAMUSCULAR | Status: AC
  Administered 2011-10-05: 6000 [IU] via SUBCUTANEOUS

## 2011-10-05 NOTE — Progress Notes (Signed)
Sabrina Beck presents today for injection per MD orders. Procrit 6000 units administered SQ in left Abdomen. Administration without incident. Patient tolerated well. Patient to have labs drawn every 4 weeks and she states they they were drawn one week ago at the doctor's office.

## 2011-10-19 ENCOUNTER — Encounter (HOSPITAL_COMMUNITY): Payer: Medicare Other | Attending: Nephrology

## 2011-10-19 VITALS — BP 120/80 | HR 89 | Temp 98.1°F

## 2011-10-19 DIAGNOSIS — D649 Anemia, unspecified: Secondary | ICD-10-CM | POA: Insufficient documentation

## 2011-10-19 LAB — RENAL FUNCTION PANEL
BUN: 53 mg/dL — ABNORMAL HIGH (ref 6–23)
CO2: 23 mEq/L (ref 19–32)
Calcium: 8.9 mg/dL (ref 8.4–10.5)
Chloride: 106 mEq/L (ref 96–112)
Creatinine, Ser: 3 mg/dL — ABNORMAL HIGH (ref 0.50–1.10)
GFR calc non Af Amer: 15 mL/min — ABNORMAL LOW (ref >90)
Glucose, Bld: 90 mg/dL (ref 70–99)

## 2011-10-19 MED ORDER — EPOETIN ALFA 10000 UNIT/ML IJ SOLN
6000.0000 [IU] | Freq: Once | INTRAMUSCULAR | Status: AC
  Administered 2011-10-19: 6000 [IU] via SUBCUTANEOUS

## 2011-10-19 NOTE — Progress Notes (Signed)
Sabrina Beck's reason for visit today is for an injection and labs as scheduled per MD orders.  Labs were drawn prior to administration of ordered medication.  Venipuncture performed with a 23 gauge butterfly needle to L Antecubital; she is due for labs again in 4 weeks according to MD order.  Labs faxed to Dr. Florentina Addison order.  Sabrina Beck also received procrit per MD orders; see [facility] for administration details.  Sabrina Beck tolerated all procedures well and without incident; questions were answered and patient was discharged.   Lab Results  Component Value Date   HGB 9.1* 10/19/2011

## 2011-11-02 ENCOUNTER — Encounter (INDEPENDENT_AMBULATORY_CARE_PROVIDER_SITE_OTHER): Payer: Medicare Other

## 2011-11-02 VITALS — BP 118/76 | HR 82

## 2011-11-02 DIAGNOSIS — D649 Anemia, unspecified: Secondary | ICD-10-CM

## 2011-11-02 MED ORDER — EPOETIN ALFA 10000 UNIT/ML IJ SOLN
6000.0000 [IU] | INTRAMUSCULAR | Status: DC
  Administered 2011-11-02: 6000 [IU] via SUBCUTANEOUS

## 2011-11-02 NOTE — Progress Notes (Signed)
Sabrina Beck presents today for injection per MD orders. Procrit 6,000 units administered SQ in left Abdomen. Administration without incident. Patient tolerated well.

## 2011-11-16 ENCOUNTER — Encounter (HOSPITAL_COMMUNITY): Payer: Medicare Other | Attending: Nephrology

## 2011-11-16 DIAGNOSIS — D649 Anemia, unspecified: Secondary | ICD-10-CM

## 2011-11-16 LAB — RENAL FUNCTION PANEL
CO2: 22 mEq/L (ref 19–32)
Chloride: 106 mEq/L (ref 96–112)
Creatinine, Ser: 3.61 mg/dL — ABNORMAL HIGH (ref 0.50–1.10)
GFR calc Af Amer: 13 mL/min — ABNORMAL LOW (ref >90)
GFR calc non Af Amer: 12 mL/min — ABNORMAL LOW (ref >90)
Sodium: 138 mEq/L (ref 135–145)

## 2011-11-16 LAB — HEMOGLOBIN AND HEMATOCRIT, BLOOD: HCT: 30.1 % — ABNORMAL LOW (ref 36.0–46.0)

## 2011-11-16 MED ORDER — EPOETIN ALFA 10000 UNIT/ML IJ SOLN
6000.0000 [IU] | Freq: Once | INTRAMUSCULAR | Status: AC
  Administered 2011-11-16: 6000 [IU] via INTRAVENOUS

## 2011-11-16 NOTE — Progress Notes (Signed)
Sabrina Beck presents today for injection per MD orders. Procrit 6,000 units administered SQ in left Abdomen. Administration without incident. Patient tolerated well.

## 2011-11-30 ENCOUNTER — Encounter (INDEPENDENT_AMBULATORY_CARE_PROVIDER_SITE_OTHER): Payer: Medicare Other

## 2011-11-30 VITALS — BP 110/75 | HR 109

## 2011-11-30 DIAGNOSIS — D649 Anemia, unspecified: Secondary | ICD-10-CM

## 2011-11-30 MED ORDER — EPOETIN ALFA 10000 UNIT/ML IJ SOLN
6000.0000 [IU] | Freq: Once | INTRAMUSCULAR | Status: AC
  Administered 2011-11-30: 6000 [IU] via SUBCUTANEOUS

## 2011-11-30 NOTE — Progress Notes (Signed)
Sabrina Beck presents today for injection per MD orders. Procrit 6000 units administered SQ in left Abdomen. Administration without incident. Patient tolerated well.

## 2011-12-14 ENCOUNTER — Encounter (HOSPITAL_COMMUNITY): Payer: Medicare Other | Attending: Nephrology

## 2011-12-14 DIAGNOSIS — D649 Anemia, unspecified: Secondary | ICD-10-CM

## 2011-12-14 LAB — HEMOGLOBIN AND HEMATOCRIT, BLOOD: Hemoglobin: 9.9 g/dL — ABNORMAL LOW (ref 12.0–15.0)

## 2011-12-14 LAB — RENAL FUNCTION PANEL
Albumin: 3.5 g/dL (ref 3.5–5.2)
BUN: 60 mg/dL — ABNORMAL HIGH (ref 6–23)
Creatinine, Ser: 3.68 mg/dL — ABNORMAL HIGH (ref 0.50–1.10)
Phosphorus: 3.8 mg/dL (ref 2.3–4.6)

## 2011-12-14 MED ORDER — EPOETIN ALFA 10000 UNIT/ML IJ SOLN
6000.0000 [IU] | Freq: Once | INTRAMUSCULAR | Status: AC
  Administered 2011-12-14: 6000 [IU] via SUBCUTANEOUS

## 2011-12-14 NOTE — Progress Notes (Signed)
Sabrina Beck presents today for injection per MD orders. Procrit 6000units administered SQ in left Abdomen. Administration without incident. Patient tolerated well. Labwork results faxed to Dr. Lowanda Foster.

## 2011-12-28 ENCOUNTER — Encounter (INDEPENDENT_AMBULATORY_CARE_PROVIDER_SITE_OTHER): Payer: Medicare Other

## 2011-12-28 VITALS — BP 122/65 | HR 89

## 2011-12-28 DIAGNOSIS — D649 Anemia, unspecified: Secondary | ICD-10-CM

## 2011-12-28 MED ORDER — EPOETIN ALFA 10000 UNIT/ML IJ SOLN: 10000.0000 [IU] | INTRAMUSCULAR | Status: DC

## 2011-12-28 MED ORDER — EPOETIN ALFA 10000 UNIT/ML IJ SOLN
10000.0000 [IU] | Freq: Once | INTRAMUSCULAR | Status: AC
  Administered 2011-12-28: 6000 [IU] via SUBCUTANEOUS

## 2011-12-28 NOTE — Progress Notes (Signed)
Tolerated injection well. 

## 2012-01-11 ENCOUNTER — Encounter (HOSPITAL_COMMUNITY): Payer: Medicare Other | Attending: Nephrology

## 2012-01-11 DIAGNOSIS — D649 Anemia, unspecified: Secondary | ICD-10-CM | POA: Insufficient documentation

## 2012-01-11 LAB — HEMOGLOBIN AND HEMATOCRIT, BLOOD
HCT: 27.5 % — ABNORMAL LOW (ref 36.0–46.0)
Hemoglobin: 8.9 g/dL — ABNORMAL LOW (ref 12.0–15.0)

## 2012-01-11 LAB — RENAL FUNCTION PANEL
BUN: 52 mg/dL — ABNORMAL HIGH (ref 6–23)
CO2: 19 mEq/L (ref 19–32)
Calcium: 8.6 mg/dL (ref 8.4–10.5)
Creatinine, Ser: 3.25 mg/dL — ABNORMAL HIGH (ref 0.50–1.10)
GFR calc non Af Amer: 13 mL/min — ABNORMAL LOW (ref >90)

## 2012-01-11 MED ORDER — EPOETIN ALFA 10000 UNIT/ML IJ SOLN
6000.0000 [IU] | Freq: Once | INTRAMUSCULAR | Status: AC
  Administered 2012-01-11: 6000 [IU] via SUBCUTANEOUS

## 2012-01-11 NOTE — Progress Notes (Signed)
Sabrina Beck presented for [employer]. Labs per MD order drawn via Peripheral Line 25 gauge needle inserted in lt arm   Procedure without incident.  Needle removed intact. Patient tolerated procedure well.  Sabrina Beck presents today for injection per MD orders. Procrit 6000 units administered SQ in left Abdomen. Administration without incident. Patient tolerated well.

## 2012-01-12 LAB — PTH, INTACT AND CALCIUM: PTH: 206.2 pg/mL — ABNORMAL HIGH (ref 14.0–72.0)

## 2012-01-12 LAB — VITAMIN D 25 HYDROXY (VIT D DEFICIENCY, FRACTURES): Vit D, 25-Hydroxy: 25 ng/mL — ABNORMAL LOW (ref 30–89)

## 2012-01-12 LAB — FERRITIN: Ferritin: 670 ng/mL — ABNORMAL HIGH (ref 10–291)

## 2012-01-12 LAB — IRON AND TIBC
Saturation Ratios: 43 % (ref 20–55)
TIBC: 351 ug/dL (ref 250–470)

## 2012-01-25 ENCOUNTER — Ambulatory Visit (HOSPITAL_COMMUNITY): Payer: Medicare Other

## 2012-01-30 ENCOUNTER — Encounter (INDEPENDENT_AMBULATORY_CARE_PROVIDER_SITE_OTHER): Payer: Medicare Other

## 2012-01-30 VITALS — BP 130/74 | HR 90

## 2012-01-30 DIAGNOSIS — D649 Anemia, unspecified: Secondary | ICD-10-CM

## 2012-01-30 MED ORDER — EPOETIN ALFA 10000 UNIT/ML IJ SOLN
8000.0000 [IU] | Freq: Once | INTRAMUSCULAR | Status: AC
  Administered 2012-01-30: 8000 [IU] via SUBCUTANEOUS

## 2012-01-30 NOTE — Progress Notes (Signed)
Sabrina Beck presents today for injection per MD orders. Procrit 8,000 units administered SQ in right Abdomen. Administration without incident. Patient tolerated well.

## 2012-02-13 ENCOUNTER — Encounter (HOSPITAL_COMMUNITY): Payer: Medicare Other | Attending: Nephrology

## 2012-02-13 VITALS — BP 126/68 | HR 86

## 2012-02-13 DIAGNOSIS — D649 Anemia, unspecified: Secondary | ICD-10-CM | POA: Insufficient documentation

## 2012-02-13 LAB — RENAL FUNCTION PANEL
Albumin: 3.5 g/dL (ref 3.5–5.2)
GFR calc Af Amer: 17 mL/min — ABNORMAL LOW (ref >90)
Glucose, Bld: 103 mg/dL — ABNORMAL HIGH (ref 70–99)
Phosphorus: 3.8 mg/dL (ref 2.3–4.6)
Potassium: 4.2 mEq/L (ref 3.5–5.1)
Sodium: 140 mEq/L (ref 135–145)

## 2012-02-13 MED ORDER — EPOETIN ALFA 10000 UNIT/ML IJ SOLN
8000.0000 [IU] | Freq: Once | INTRAMUSCULAR | Status: AC
  Administered 2012-02-13: 11:00:00 via SUBCUTANEOUS

## 2012-02-13 MED ORDER — EPOETIN ALFA 10000 UNIT/ML IJ SOLN: 8000.0000 [IU] | INTRAMUSCULAR | Status: DC

## 2012-02-13 NOTE — Progress Notes (Signed)
Tolerated injection well. 

## 2012-02-27 ENCOUNTER — Encounter (INDEPENDENT_AMBULATORY_CARE_PROVIDER_SITE_OTHER): Payer: Medicare Other

## 2012-02-27 DIAGNOSIS — D649 Anemia, unspecified: Secondary | ICD-10-CM

## 2012-02-27 MED ORDER — EPOETIN ALFA 10000 UNIT/ML IJ SOLN
8000.0000 [IU] | Freq: Once | INTRAMUSCULAR | Status: AC
  Administered 2012-02-27: 8000 [IU] via SUBCUTANEOUS

## 2012-02-27 NOTE — Progress Notes (Signed)
Sabrina Beck presents today for injection per the provider's orders.  Procrit administered administration without incident; see MAR for injection details.  Patient tolerated procedure well and without incident.  No questions or complaints noted at this time.  Patient is due to return in 2 weeks for labs and another injection.

## 2012-03-03 ENCOUNTER — Emergency Department (HOSPITAL_COMMUNITY)
Admission: EM | Admit: 2012-03-03 | Discharge: 2012-03-03 | Disposition: A | Discharge status: Home / Self Care | Payer: Medicare Other | Attending: Emergency Medicine | Admitting: Emergency Medicine

## 2012-03-03 ENCOUNTER — Emergency Department (HOSPITAL_COMMUNITY): Payer: Medicare Other

## 2012-03-03 ENCOUNTER — Encounter (HOSPITAL_COMMUNITY): Payer: Self-pay

## 2012-03-03 DIAGNOSIS — K5792 Diverticulitis of intestine, part unspecified, without perforation or abscess without bleeding: Secondary | ICD-10-CM

## 2012-03-03 DIAGNOSIS — K5732 Diverticulitis of large intestine without perforation or abscess without bleeding: Secondary | ICD-10-CM | POA: Insufficient documentation

## 2012-03-03 DIAGNOSIS — Z79899 Other long term (current) drug therapy: Secondary | ICD-10-CM | POA: Insufficient documentation

## 2012-03-03 DIAGNOSIS — I4891 Unspecified atrial fibrillation: Secondary | ICD-10-CM | POA: Insufficient documentation

## 2012-03-03 DIAGNOSIS — Z7901 Long term (current) use of anticoagulants: Secondary | ICD-10-CM | POA: Insufficient documentation

## 2012-03-03 DIAGNOSIS — Z87891 Personal history of nicotine dependence: Secondary | ICD-10-CM | POA: Insufficient documentation

## 2012-03-03 HISTORY — DX: Unspecified atrial fibrillation: I48.91

## 2012-03-03 LAB — URINALYSIS, ROUTINE W REFLEX MICROSCOPIC
Ketones, ur: NEGATIVE mg/dL
Leukocytes, UA: NEGATIVE
Nitrite: NEGATIVE
Protein, ur: 100 mg/dL — AB
pH: 6 (ref 5.0–8.0)

## 2012-03-03 LAB — CBC WITH DIFFERENTIAL/PLATELET
Basophils Absolute: 0 10*3/uL (ref 0.0–0.1)
Lymphocytes Relative: 23 % (ref 12–46)
Lymphs Abs: 2.6 10*3/uL (ref 0.7–4.0)
MCV: 96.8 fL (ref 78.0–100.0)
Neutro Abs: 7.8 10*3/uL — ABNORMAL HIGH (ref 1.7–7.7)
Neutrophils Relative %: 69 % (ref 43–77)
Platelets: 236 10*3/uL (ref 150–400)
RBC: 2.82 MIL/uL — ABNORMAL LOW (ref 3.87–5.11)
RDW: 14.6 % (ref 11.5–15.5)
WBC: 11.4 10*3/uL — ABNORMAL HIGH (ref 4.0–10.5)

## 2012-03-03 LAB — COMPREHENSIVE METABOLIC PANEL
ALT: 22 U/L (ref 0–35)
AST: 24 U/L (ref 0–37)
Alkaline Phosphatase: 46 U/L (ref 39–117)
CO2: 23 mEq/L (ref 19–32)
Chloride: 100 mEq/L (ref 96–112)
GFR calc Af Amer: 17 mL/min — ABNORMAL LOW (ref >90)
GFR calc non Af Amer: 14 mL/min — ABNORMAL LOW (ref >90)
Glucose, Bld: 110 mg/dL — ABNORMAL HIGH (ref 70–99)
Potassium: 3.5 mEq/L (ref 3.5–5.1)
Sodium: 136 mEq/L (ref 135–145)

## 2012-03-03 LAB — URINE MICROSCOPIC-ADD ON

## 2012-03-03 MED ORDER — CIPROFLOXACIN HCL 500 MG PO TABS: 500.0000 mg | ORAL_TABLET | Freq: Two times a day (BID) | ORAL | Status: AC

## 2012-03-03 MED ORDER — METRONIDAZOLE 500 MG PO TABS: 500.0000 mg | ORAL_TABLET | Freq: Three times a day (TID) | ORAL | Status: AC

## 2012-03-03 MED ORDER — HYDROCODONE-ACETAMINOPHEN 5-500 MG PO TABS: 1.0000 | ORAL_TABLET | Freq: Four times a day (QID) | ORAL | Status: AC | PRN

## 2012-03-03 NOTE — ED Notes (Signed)
Patient does not need anything at this time. 

## 2012-03-03 NOTE — ED Notes (Signed)
Discharge instructions reviewed with pt, questions answered. Pt verbalized understanding.  

## 2012-03-03 NOTE — ED Provider Notes (Signed)
History  This chart was scribed for Veryl Speak, MD by Lovena Le Day. This patient was seen in room APA11/APA11 and the patient's care was started at 1144.   CSN: [id number]  Arrival date & time 03/03/12  1144   First MD Initiated Contact with Patient 03/03/12 1507      Chief Complaint  Patient presents with  . Abdominal Pain    Patient is a 71 y.o. female presenting with abdominal pain. The history is provided by the patient. No language interpreter was used.  Abdominal Pain The primary symptoms of the illness include abdominal pain. The primary symptoms of the illness do not include fatigue, shortness of breath, nausea, vomiting or diarrhea. The current episode started 2 days ago. The onset of the illness was gradual. The problem has been gradually worsening.  The patient states that she believes she is currently not pregnant. The patient has not had a change in bowel habit. Risk factors for an acute abdominal problem include being elderly and a history of abdominal surgery. Symptoms associated with the illness do not include chills or constipation. Significant associated medical issues include diverticulitis (Possible but unsure.).   Sabrina Beck is a 71 y.o. female who presents to the Emergency Department complaining of constant worsening lower abdominal pain for 2 days. She states her abdominal pain is made worse by just sitting or any movements. She states she has had normal BMs recently, but some blood in stool which she contributes to hemmrhoids. She denies fever or eating any sort of food that upsets her stomach. Her past medical history includes cholecystectomy, hysterectomy, unsure of appendectomy, coloscopy in 2010 with polyps removed, A-fib, kidney failure, and possible previous diverticulosis. She denies any other injuries or illnesses.  Past Medical History  Diagnosis Date  . Anemia 03/27/2011  . Renal disorder   . Atrial fibrillation     Past Surgical History  Procedure  Date  . Cholecystectomy   . Abdominal hysterectomy   . Breast surgery     No family history on file.  History  Substance Use Topics  . Smoking status: Former Research scientist (life sciences)  . Smokeless tobacco: Not on file  . Alcohol Use: No    OB History    Grav Para Term Preterm Abortions TAB SAB Ect Mult Living                  Review of Systems  Constitutional: Negative for chills and fatigue.  HENT: Negative for congestion.   Respiratory: Negative for cough and shortness of breath.   Cardiovascular: Negative for chest pain.  Gastrointestinal: Positive for abdominal pain. Negative for nausea, vomiting, diarrhea, constipation and abdominal distention.  Genitourinary: Negative for difficulty urinating.  Skin: Negative for color change and pallor.  Neurological: Negative for syncope.  All other systems reviewed and are negative.   A complete 10 system review of systems was obtained and all systems are negative except as noted in the HPI and PMH.   Allergies  Review of patient's allergies indicates no known allergies.  Home Medications   Current Outpatient Rx  Name Route Sig Dispense Refill  . OCUVITE PO TABS Oral Take 1 tablet by mouth daily.    Marland Kitchen CALCITRIOL 0.25 MCG PO CAPS Oral Take 0.25 mcg by mouth daily.    Marland Kitchen DILTIAZEM HCL ER 120 MG PO CP12 Oral Take 120 mg by mouth daily.    Marland Kitchen ESOMEPRAZOLE MAGNESIUM 40 MG PO CPDR Oral Take 40 mg by mouth daily before breakfast.    .  FENOFIBRATE 145 MG PO TABS Oral Take 145 mg by mouth daily.    Marland Kitchen METOPROLOL TARTRATE 25 MG PO TABS Oral Take 37.5 mg by mouth 2 (two) times daily.     Marland Kitchen FISH OIL 1200 MG PO CAPS Oral Take 1 capsule by mouth 2 (two) times daily.    Marland Kitchen SEVELAMER CARBONATE 800 MG PO TABS Oral Take 800 mg by mouth 3 (three) times daily with meals. And patient also takes wit snacks    . SIMVASTATIN 40 MG PO TABS Oral Take 40 mg by mouth every evening.    Marland Kitchen SODIUM BICARBONATE 650 MG PO TABS Oral Take 650 mg by mouth 3 (three) times daily.    Marland Kitchen  VITAMIN D (ERGOCALCIFEROL) 50000 UNITS PO CAPS Oral Take 50,000 Units by mouth every 7 (seven) days.    . WARFARIN SODIUM 5 MG PO TABS Oral Take 2.5-5 mg by mouth daily. Patient takes a 1/2 tablet(2.5mg ) on Sunday, Monday,Thursday,Friday and 1 tablet(5mg ) all other days      Triage Vitals: BP 140/70  Pulse 95  Temp 99.5 F (37.5 C) (Oral)  Resp 22  Ht 5\' 3"  (1.6 m)  Wt 200 lb (90.719 kg)  BMI 35.43 kg/m2  SpO2 98%  Physical Exam  Nursing note and vitals reviewed. Constitutional: She is oriented to person, place, and time. She appears well-developed and well-nourished. No distress.  HENT:  Head: Normocephalic and atraumatic.  Eyes: Conjunctivae and EOM are normal.  Neck: Normal range of motion. Neck supple.  Cardiovascular: Normal rate, regular rhythm and normal heart sounds.   Pulmonary/Chest: Effort normal and breath sounds normal. No respiratory distress. She has no wheezes. She has no rales.  Abdominal: Soft. She exhibits no distension. There is tenderness (Lower abdomen to palpation in suprapubic region and right/left lower quadrants. ). There is no rebound and no guarding.  Musculoskeletal: Normal range of motion. She exhibits no tenderness (No CVA tenderness. ).  Neurological: She is alert and oriented to person, place, and time.  Skin: Skin is warm and dry.  Psychiatric: She has a normal mood and affect.    ED Course  Procedures (including critical care time) DIAGNOSTIC STUDIES: Oxygen Saturation is 98% on room air, normal by my interpretation.    COORDINATION OF CARE: At 315 PM Discussed treatment plan with patient which includes abdominal CT, blood work, refuses pain medicine and urine analysis. Patient agrees.   At 543 PM - I informed patient of abdominal CT results which show diverticulitis and explained what this diagnosis means. I explained her treatment with pain/antibiotic medicine. Patient agrees and understands.  Labs Reviewed  URINALYSIS, ROUTINE W REFLEX  MICROSCOPIC - Abnormal; Notable for the following:    Glucose, UA 100 (*)     Hgb urine dipstick TRACE (*)     Protein, ur 100 (*)     All other components within normal limits  CBC WITH DIFFERENTIAL - Abnormal; Notable for the following:    WBC 11.4 (*)     RBC 2.82 (*)     Hemoglobin 9.0 (*)     HCT 27.3 (*)     Neutro Abs 7.8 (*)     All other components within normal limits  COMPREHENSIVE METABOLIC PANEL - Abnormal; Notable for the following:    Glucose, Bld 110 (*)     BUN 36 (*)     Creatinine, Ser 3.01 (*)     Albumin 3.2 (*)     GFR calc non Af Amer 14 (*)  GFR calc Af Amer 17 (*)     All other components within normal limits  URINE MICROSCOPIC-ADD ON - Abnormal; Notable for the following:    Squamous Epithelial / LPF FEW (*)     Bacteria, UA FEW (*)     Casts GRANULAR CAST (*)     All other components within normal limits   No results found.   No diagnosis found.    MDM  The workup reveals an elevated wbc, normal urine.  I proceeded with a ct scan without iv contrast (cri) which revealed uncomplicated diverticulitis.  She does not appear toxic, the exam is benign and is not vomiting.  I believe she is appropriate for outpatient oral antibiotics. She will be discharged with cipro, flagyl, lortab.  To return prn if worsens.      I personally performed the services described in this documentation, which was scribed in my presence. The recorded information has been reviewed and considered.          Veryl Speak, MD 03/03/12 1944

## 2012-03-03 NOTE — ED Notes (Signed)
Pt c/o intermittent lower abd pain for past few days.  Reports thought was constipated but took a laxative and has had results.  Pt says pain is worse when she sits down.  Denies any pain or burning with urination.  Reports has history of kidney disease.

## 2012-03-03 NOTE — ED Notes (Signed)
MD at bedside. 

## 2012-03-12 ENCOUNTER — Encounter (HOSPITAL_COMMUNITY): Payer: Medicare Other | Attending: Nephrology

## 2012-03-12 VITALS — BP 146/84 | HR 96 | Resp 18

## 2012-03-12 DIAGNOSIS — D649 Anemia, unspecified: Secondary | ICD-10-CM | POA: Insufficient documentation

## 2012-03-12 MED ORDER — EPOETIN ALFA 10000 UNIT/ML IJ SOLN
8000.0000 [IU] | Freq: Once | INTRAMUSCULAR | Status: AC
  Administered 2012-03-12: 8000 [IU] via SUBCUTANEOUS

## 2012-03-12 NOTE — Progress Notes (Signed)
Sabrina Beck presents today for injection per MD orders. Procrit 8,000 units administered SQ in left Abdomen. Administration without incident. Patient tolerated well.

## 2012-03-26 ENCOUNTER — Encounter (INDEPENDENT_AMBULATORY_CARE_PROVIDER_SITE_OTHER): Payer: Medicare Other

## 2012-03-26 VITALS — BP 132/80 | HR 86 | Resp 18

## 2012-03-26 DIAGNOSIS — D649 Anemia, unspecified: Secondary | ICD-10-CM

## 2012-03-26 LAB — DIFFERENTIAL
Eosinophils Absolute: 0.2 10*3/uL (ref 0.0–0.7)
Eosinophils Relative: 4 % (ref 0–5)
Monocytes Absolute: 0.4 10*3/uL (ref 0.1–1.0)

## 2012-03-26 LAB — CBC
Hemoglobin: 9.6 g/dL — ABNORMAL LOW (ref 12.0–15.0)
MCH: 31.7 pg (ref 26.0–34.0)
MCV: 95.4 fL (ref 78.0–100.0)
Platelets: 200 10*3/uL (ref 150–400)
RBC: 3.03 MIL/uL — ABNORMAL LOW (ref 3.87–5.11)
WBC: 6.3 10*3/uL (ref 4.0–10.5)

## 2012-03-26 LAB — COMPREHENSIVE METABOLIC PANEL
ALT: 20 U/L (ref 0–35)
AST: 23 U/L (ref 0–37)
CO2: 26 mEq/L (ref 19–32)
Calcium: 8.9 mg/dL (ref 8.4–10.5)
Chloride: 106 mEq/L (ref 96–112)
Creatinine, Ser: 3.17 mg/dL — ABNORMAL HIGH (ref 0.50–1.10)
GFR calc Af Amer: 16 mL/min — ABNORMAL LOW (ref >90)
GFR calc non Af Amer: 14 mL/min — ABNORMAL LOW (ref >90)
Glucose, Bld: 130 mg/dL — ABNORMAL HIGH (ref 70–99)
Sodium: 142 mEq/L (ref 135–145)
Total Bilirubin: 0.4 mg/dL (ref 0.3–1.2)

## 2012-03-26 MED ORDER — EPOETIN ALFA 10000 UNIT/ML IJ SOLN: 10000.0000 [IU] | INTRAMUSCULAR | Status: DC

## 2012-03-26 MED ORDER — EPOETIN ALFA 10000 UNIT/ML IJ SOLN
8000.0000 [IU] | INTRAMUSCULAR | Status: DC
  Administered 2012-03-26: 8000 [IU] via SUBCUTANEOUS

## 2012-03-26 MED ORDER — EPOETIN ALFA 10000 UNIT/ML IJ SOLN: 10000.0000 [IU] | Freq: Once | INTRAMUSCULAR | Status: DC

## 2012-03-26 NOTE — Progress Notes (Signed)
Sabrina Beck presents today for injection per MD orders. Procrit 8,000 units administered SQ in left Abdomen. Administration without incident. Patient tolerated well.

## 2012-04-09 ENCOUNTER — Encounter (HOSPITAL_COMMUNITY): Payer: Medicare Other | Attending: Nephrology

## 2012-04-09 VITALS — BP 127/80 | HR 90 | Resp 18

## 2012-04-09 DIAGNOSIS — N189 Chronic kidney disease, unspecified: Secondary | ICD-10-CM | POA: Insufficient documentation

## 2012-04-09 DIAGNOSIS — D649 Anemia, unspecified: Secondary | ICD-10-CM | POA: Insufficient documentation

## 2012-04-09 MED ORDER — EPOETIN ALFA 10000 UNIT/ML IJ SOLN
8000.0000 [IU] | Freq: Once | INTRAMUSCULAR | Status: AC
  Administered 2012-04-09: 8000 [IU] via SUBCUTANEOUS

## 2012-04-09 NOTE — Progress Notes (Signed)
Sabrina Beck presents today for injection per MD orders. Procrit 8,000 units administered SQ in left Abdomen. Administration without incident. Patient tolerated well.

## 2012-04-23 ENCOUNTER — Encounter (HOSPITAL_BASED_OUTPATIENT_CLINIC_OR_DEPARTMENT_OTHER): Payer: Medicare Other

## 2012-04-23 ENCOUNTER — Other Ambulatory Visit (HOSPITAL_COMMUNITY): Payer: Medicare Other

## 2012-04-23 ENCOUNTER — Encounter (INDEPENDENT_AMBULATORY_CARE_PROVIDER_SITE_OTHER): Payer: Medicare Other

## 2012-04-23 VITALS — BP 134/88

## 2012-04-23 DIAGNOSIS — D649 Anemia, unspecified: Secondary | ICD-10-CM

## 2012-04-23 LAB — RENAL FUNCTION PANEL
CO2: 24 mEq/L (ref 19–32)
Chloride: 104 mEq/L (ref 96–112)
GFR calc Af Amer: 17 mL/min — ABNORMAL LOW (ref >90)
GFR calc non Af Amer: 14 mL/min — ABNORMAL LOW (ref >90)
Potassium: 3.8 mEq/L (ref 3.5–5.1)
Sodium: 138 mEq/L (ref 135–145)

## 2012-04-23 LAB — HEMOGLOBIN AND HEMATOCRIT, BLOOD: HCT: 30.2 % — ABNORMAL LOW (ref 36.0–46.0)

## 2012-04-23 MED ORDER — EPOETIN ALFA 10000 UNIT/ML IJ SOLN
INTRAMUSCULAR | Status: AC
  Filled 2012-04-23: qty 1

## 2012-04-23 MED ORDER — EPOETIN ALFA 10000 UNIT/ML IJ SOLN
8000.0000 [IU] | Freq: Once | INTRAMUSCULAR | Status: AC
  Administered 2012-04-23: 10000 [IU] via INTRAVENOUS

## 2012-04-23 NOTE — Progress Notes (Signed)
Labs drawn today for hh and renal

## 2012-04-23 NOTE — Progress Notes (Signed)
[name] presents today for injection per MD orders. Procrit 8000 units administered SQ in left Abdomen. Administration without incident. Patient tolerated well.

## 2012-05-06 ENCOUNTER — Encounter (INDEPENDENT_AMBULATORY_CARE_PROVIDER_SITE_OTHER): Payer: Medicare Other

## 2012-05-06 ENCOUNTER — Other Ambulatory Visit (HOSPITAL_COMMUNITY): Payer: Medicare Other

## 2012-05-06 VITALS — BP 133/84

## 2012-05-06 DIAGNOSIS — D649 Anemia, unspecified: Secondary | ICD-10-CM

## 2012-05-06 MED ORDER — EPOETIN ALFA 10000 UNIT/ML IJ SOLN
8000.0000 [IU] | Freq: Once | INTRAMUSCULAR | Status: AC
  Administered 2012-05-06: 8000 [IU] via INTRAVENOUS

## 2012-05-06 NOTE — Progress Notes (Signed)
Sabrina Beck presents today for injection per MD orders. Procrit 8000 administered SQ in left Abdomen. Administration without incident. Patient tolerated well.

## 2012-05-07 ENCOUNTER — Ambulatory Visit (HOSPITAL_COMMUNITY): Payer: Medicare Other

## 2012-05-07 ENCOUNTER — Other Ambulatory Visit (HOSPITAL_COMMUNITY): Payer: Medicare Other

## 2012-05-21 ENCOUNTER — Encounter (HOSPITAL_COMMUNITY): Payer: Medicare Other | Attending: Nephrology

## 2012-05-21 VITALS — BP 130/83 | HR 88

## 2012-05-21 DIAGNOSIS — D649 Anemia, unspecified: Secondary | ICD-10-CM

## 2012-05-21 LAB — RENAL FUNCTION PANEL
Calcium: 8.7 mg/dL (ref 8.4–10.5)
Creatinine, Ser: 3.22 mg/dL — ABNORMAL HIGH (ref 0.50–1.10)
GFR calc Af Amer: 15 mL/min — ABNORMAL LOW (ref >90)
GFR calc non Af Amer: 13 mL/min — ABNORMAL LOW (ref >90)
Phosphorus: 3.5 mg/dL (ref 2.3–4.6)
Sodium: 140 mEq/L (ref 135–145)

## 2012-05-21 LAB — HEMOGLOBIN AND HEMATOCRIT, BLOOD
HCT: 31 % — ABNORMAL LOW (ref 36.0–46.0)
Hemoglobin: 10.3 g/dL — ABNORMAL LOW (ref 12.0–15.0)

## 2012-05-21 MED ORDER — EPOETIN ALFA 10000 UNIT/ML IJ SOLN: 8000.0000 [IU] | Freq: Once | INTRAMUSCULAR | Status: DC

## 2012-05-21 MED ORDER — EPOETIN ALFA 10000 UNIT/ML IJ SOLN
8000.0000 [IU] | Freq: Once | INTRAMUSCULAR | Status: AC
  Administered 2012-05-21: 11:00:00 via SUBCUTANEOUS

## 2012-05-21 NOTE — Progress Notes (Signed)
Tolerated venipuncture and injection well.

## 2012-06-05 ENCOUNTER — Encounter (HOSPITAL_COMMUNITY): Payer: Medicare Other

## 2012-06-05 VITALS — BP 130/77 | Resp 16

## 2012-06-05 DIAGNOSIS — D649 Anemia, unspecified: Secondary | ICD-10-CM

## 2012-06-05 MED ORDER — EPOETIN ALFA 10000 UNIT/ML IJ SOLN
8000.0000 [IU] | Freq: Once | INTRAMUSCULAR | Status: AC
  Administered 2012-06-05: 8000 [IU] via SUBCUTANEOUS

## 2012-06-05 NOTE — Progress Notes (Signed)
Sabrina Beck presents today for injection per the provider's orders.  Procrit administered administration without incident; see MAR for injection details.  Patient tolerated procedure well and without incident.  No questions or complaints noted at this time.  Patient is due for labs next week.

## 2012-06-19 ENCOUNTER — Encounter (HOSPITAL_COMMUNITY): Payer: Medicare Other | Attending: Nephrology

## 2012-06-19 VITALS — BP 151/79 | HR 98 | Temp 97.7°F | Resp 16 | Wt 215.0 lb

## 2012-06-19 DIAGNOSIS — D649 Anemia, unspecified: Secondary | ICD-10-CM

## 2012-06-19 MED ORDER — EPOETIN ALFA 10000 UNIT/ML IJ SOLN
8000.0000 [IU] | INTRAMUSCULAR | Status: DC
  Administered 2012-06-19: 8000 [IU] via SUBCUTANEOUS

## 2012-06-19 NOTE — Progress Notes (Signed)
Tolerated injection well. 

## 2012-07-02 ENCOUNTER — Encounter (HOSPITAL_COMMUNITY): Payer: Medicare Other

## 2012-07-02 VITALS — BP 145/78 | HR 89

## 2012-07-02 DIAGNOSIS — D649 Anemia, unspecified: Secondary | ICD-10-CM

## 2012-07-02 LAB — RENAL FUNCTION PANEL
CO2: 24 mEq/L (ref 19–32)
Calcium: 9.4 mg/dL (ref 8.4–10.5)
GFR calc Af Amer: 16 mL/min — ABNORMAL LOW (ref >90)
GFR calc non Af Amer: 14 mL/min — ABNORMAL LOW (ref >90)
Phosphorus: 4.6 mg/dL (ref 2.3–4.6)
Potassium: 3.5 mEq/L (ref 3.5–5.1)
Sodium: 139 mEq/L (ref 135–145)

## 2012-07-02 LAB — HEMOGLOBIN AND HEMATOCRIT, BLOOD: HCT: 31.1 % — ABNORMAL LOW (ref 36.0–46.0)

## 2012-07-02 MED ORDER — EPOETIN ALFA 10000 UNIT/ML IJ SOLN
8000.0000 [IU] | Freq: Once | INTRAMUSCULAR | Status: AC
  Administered 2012-07-02: 8000 [IU] via SUBCUTANEOUS

## 2012-07-02 NOTE — Progress Notes (Signed)
Tolerated injections and venipuncture well.

## 2012-07-16 ENCOUNTER — Encounter (HOSPITAL_COMMUNITY): Payer: Medicare Other | Attending: Nephrology

## 2012-07-16 VITALS — BP 118/83 | HR 83 | Resp 18

## 2012-07-16 DIAGNOSIS — N189 Chronic kidney disease, unspecified: Secondary | ICD-10-CM | POA: Insufficient documentation

## 2012-07-16 DIAGNOSIS — D631 Anemia in chronic kidney disease: Secondary | ICD-10-CM | POA: Insufficient documentation

## 2012-07-16 DIAGNOSIS — D649 Anemia, unspecified: Secondary | ICD-10-CM | POA: Insufficient documentation

## 2012-07-16 DIAGNOSIS — I10 Essential (primary) hypertension: Secondary | ICD-10-CM | POA: Insufficient documentation

## 2012-07-16 DIAGNOSIS — N039 Chronic nephritic syndrome with unspecified morphologic changes: Secondary | ICD-10-CM

## 2012-07-16 MED ORDER — EPOETIN ALFA 10000 UNIT/ML IJ SOLN
8000.0000 [IU] | Freq: Once | INTRAMUSCULAR | Status: AC
  Administered 2012-07-16: 8000 [IU] via SUBCUTANEOUS

## 2012-07-16 NOTE — Progress Notes (Signed)
Sabrina Beck presents today for injection per MD orders. Procrit 8000 units administered SQ in left Abdomen. Administration without incident. Patient tolerated well.

## 2012-07-29 ENCOUNTER — Encounter (HOSPITAL_COMMUNITY): Payer: Medicare Other | Attending: Nephrology

## 2012-07-29 VITALS — BP 114/76 | HR 86

## 2012-07-29 DIAGNOSIS — D649 Anemia, unspecified: Secondary | ICD-10-CM

## 2012-07-29 DIAGNOSIS — N189 Chronic kidney disease, unspecified: Secondary | ICD-10-CM

## 2012-07-29 DIAGNOSIS — I10 Essential (primary) hypertension: Secondary | ICD-10-CM

## 2012-07-29 LAB — RENAL FUNCTION PANEL
Albumin: 3.5 g/dL (ref 3.5–5.2)
BUN: 46 mg/dL — ABNORMAL HIGH (ref 6–23)
Chloride: 107 mEq/L (ref 96–112)
Creatinine, Ser: 4.02 mg/dL — ABNORMAL HIGH (ref 0.50–1.10)
Glucose, Bld: 104 mg/dL — ABNORMAL HIGH (ref 70–99)
Potassium: 3.7 mEq/L (ref 3.5–5.1)

## 2012-07-29 LAB — HEMOGLOBIN AND HEMATOCRIT, BLOOD: Hemoglobin: 9.9 g/dL — ABNORMAL LOW (ref 12.0–15.0)

## 2012-07-29 MED ORDER — EPOETIN ALFA 10000 UNIT/ML IJ SOLN
INTRAMUSCULAR | Status: AC
  Filled 2012-07-29: qty 1

## 2012-07-29 MED ORDER — EPOETIN ALFA 10000 UNIT/ML IJ SOLN
8000.0000 [IU] | Freq: Once | INTRAMUSCULAR | Status: AC
  Administered 2012-07-29: 8000 [IU] via SUBCUTANEOUS

## 2012-07-29 NOTE — Progress Notes (Signed)
Labs drawn today for h&h ,renal

## 2012-07-29 NOTE — Progress Notes (Signed)
Sabrina Beck presents today for injection per the provider's orders.  Procrit administered administration without incident; see MAR for injection details.  Patient tolerated procedure well and without incident.  No questions or complaints noted at this time.  Labs drawn previously by A. Nance.  Lab Results  Component Value Date   HGB 9.9* 07/29/2012

## 2012-08-12 ENCOUNTER — Ambulatory Visit (HOSPITAL_COMMUNITY): Payer: Medicare Other

## 2012-08-13 ENCOUNTER — Encounter (HOSPITAL_COMMUNITY): Payer: Medicare Other | Attending: Nephrology

## 2012-08-13 VITALS — BP 126/71 | HR 91

## 2012-08-13 DIAGNOSIS — D631 Anemia in chronic kidney disease: Secondary | ICD-10-CM | POA: Insufficient documentation

## 2012-08-13 DIAGNOSIS — N189 Chronic kidney disease, unspecified: Secondary | ICD-10-CM | POA: Insufficient documentation

## 2012-08-13 DIAGNOSIS — D649 Anemia, unspecified: Secondary | ICD-10-CM

## 2012-08-13 MED ORDER — EPOETIN ALFA 10000 UNIT/ML IJ SOLN
8000.0000 [IU] | Freq: Once | INTRAMUSCULAR | Status: AC
  Administered 2012-08-13: 8000 [IU] via SUBCUTANEOUS

## 2012-08-13 NOTE — Progress Notes (Signed)
Sabrina Beck presents today for injection per MD orders. Procrit 8000 units administered SQ in  right: Abdomen. Administration without incident. Patient tolerated well.

## 2012-08-27 ENCOUNTER — Encounter (HOSPITAL_COMMUNITY): Payer: Medicare Other

## 2012-08-27 VITALS — BP 144/88 | HR 96 | Resp 20

## 2012-08-27 DIAGNOSIS — D649 Anemia, unspecified: Secondary | ICD-10-CM

## 2012-08-27 LAB — RENAL FUNCTION PANEL
BUN: 45 mg/dL — ABNORMAL HIGH (ref 6–23)
CO2: 22 mEq/L (ref 19–32)
Chloride: 104 mEq/L (ref 96–112)
GFR calc Af Amer: 15 mL/min — ABNORMAL LOW (ref >90)
Glucose, Bld: 77 mg/dL (ref 70–99)
Potassium: 4 mEq/L (ref 3.5–5.1)
Sodium: 138 mEq/L (ref 135–145)

## 2012-08-27 LAB — HEMOGLOBIN AND HEMATOCRIT, BLOOD: HCT: 31.1 % — ABNORMAL LOW (ref 36.0–46.0)

## 2012-08-27 MED ORDER — EPOETIN ALFA 10000 UNIT/ML IJ SOLN
8000.0000 [IU] | Freq: Once | INTRAMUSCULAR | Status: AC
  Administered 2012-08-27: 8000 [IU] via SUBCUTANEOUS

## 2012-08-27 NOTE — Progress Notes (Signed)
Sabrina Beck presented for [employer]. Labs per MD order drawn via Peripheral Line 23 gauge needle inserted in right antecubital.  Good blood return present. Procedure without incident.  Needle removed intact. Patient tolerated procedure well.  Sabrina Beck presents today for injection per MD orders. Procrit 8000 units administered SQ in right Abdomen. Administration without incident. Patient tolerated well.

## 2012-08-27 NOTE — Addendum Note (Signed)
Addended by: Berneta Levins on: 08/27/2012 11:49 AM   Modules accepted: Orders

## 2012-09-10 ENCOUNTER — Encounter (HOSPITAL_COMMUNITY): Payer: Medicare Other

## 2012-09-10 VITALS — BP 149/92 | HR 90 | Resp 18

## 2012-09-10 DIAGNOSIS — D649 Anemia, unspecified: Secondary | ICD-10-CM

## 2012-09-10 DIAGNOSIS — N189 Chronic kidney disease, unspecified: Secondary | ICD-10-CM | POA: Insufficient documentation

## 2012-09-10 DIAGNOSIS — D631 Anemia in chronic kidney disease: Secondary | ICD-10-CM | POA: Insufficient documentation

## 2012-09-10 MED ORDER — EPOETIN ALFA 10000 UNIT/ML IJ SOLN
8000.0000 [IU] | Freq: Once | INTRAMUSCULAR | Status: AC
  Administered 2012-09-10: 8000 [IU] via SUBCUTANEOUS

## 2012-09-10 NOTE — Progress Notes (Signed)
Sabrina Beck presents today for injection per MD orders. Procrit 8000 units administered SQ in left Abdomen. Administration without incident. Patient tolerated well.

## 2012-09-24 ENCOUNTER — Encounter (HOSPITAL_COMMUNITY): Payer: Medicare Other | Attending: Nephrology

## 2012-09-24 ENCOUNTER — Encounter (HOSPITAL_COMMUNITY): Payer: Medicare Other

## 2012-09-24 VITALS — BP 159/86 | HR 95 | Resp 18

## 2012-09-24 DIAGNOSIS — D649 Anemia, unspecified: Secondary | ICD-10-CM

## 2012-09-24 LAB — RENAL FUNCTION PANEL
Chloride: 102 mEq/L (ref 96–112)
GFR calc Af Amer: 13 mL/min — ABNORMAL LOW (ref >90)
Glucose, Bld: 111 mg/dL — ABNORMAL HIGH (ref 70–99)
Phosphorus: 4.3 mg/dL (ref 2.3–4.6)
Potassium: 3.5 mEq/L (ref 3.5–5.1)
Sodium: 139 mEq/L (ref 135–145)

## 2012-09-24 MED ORDER — EPOETIN ALFA 10000 UNIT/ML IJ SOLN
INTRAMUSCULAR | Status: AC
  Filled 2012-09-24: qty 1

## 2012-09-24 MED ORDER — EPOETIN ALFA 10000 UNIT/ML IJ SOLN
8000.0000 [IU] | Freq: Once | INTRAMUSCULAR | Status: AC
  Administered 2012-09-24: 8000 [IU] via SUBCUTANEOUS

## 2012-09-24 NOTE — Progress Notes (Signed)
Sabrina Beck presents today for injection per MD orders. Procrit 8000 units administered SQ in left Abdomen. Administration without incident. Patient tolerated well.

## 2012-09-24 NOTE — Progress Notes (Signed)
Labs drawn today for for hh and renal

## 2012-09-27 ENCOUNTER — Encounter: Payer: Self-pay | Admitting: *Deleted

## 2012-10-08 ENCOUNTER — Encounter (HOSPITAL_COMMUNITY): Payer: Medicare Other

## 2012-10-08 ENCOUNTER — Encounter (HOSPITAL_COMMUNITY): Payer: Medicare Other | Attending: Nephrology

## 2012-10-08 VITALS — BP 133/76 | HR 90 | Resp 18

## 2012-10-08 DIAGNOSIS — N039 Chronic nephritic syndrome with unspecified morphologic changes: Secondary | ICD-10-CM | POA: Insufficient documentation

## 2012-10-08 DIAGNOSIS — D631 Anemia in chronic kidney disease: Secondary | ICD-10-CM | POA: Insufficient documentation

## 2012-10-08 DIAGNOSIS — N189 Chronic kidney disease, unspecified: Secondary | ICD-10-CM | POA: Insufficient documentation

## 2012-10-08 DIAGNOSIS — D649 Anemia, unspecified: Secondary | ICD-10-CM

## 2012-10-08 MED ORDER — HEPARIN SOD (PORK) LOCK FLUSH 100 UNIT/ML IV SOLN
INTRAVENOUS | Status: AC
  Filled 2012-10-08: qty 5

## 2012-10-08 MED ORDER — EPOETIN ALFA 10000 UNIT/ML IJ SOLN
8000.0000 [IU] | Freq: Once | INTRAMUSCULAR | Status: AC
  Administered 2012-10-08: 8000 [IU] via SUBCUTANEOUS

## 2012-10-08 NOTE — Progress Notes (Signed)
Sabrina Beck presents today for injection per MD orders. Procrit 8000 units administered SQ in left Abdomen. Administration without incident. Patient tolerated well.

## 2012-10-11 ENCOUNTER — Ambulatory Visit: Payer: Self-pay | Admitting: Cardiovascular Disease

## 2012-10-11 DIAGNOSIS — I4891 Unspecified atrial fibrillation: Secondary | ICD-10-CM

## 2012-10-11 DIAGNOSIS — Z7901 Long term (current) use of anticoagulants: Secondary | ICD-10-CM

## 2012-10-22 ENCOUNTER — Encounter (HOSPITAL_BASED_OUTPATIENT_CLINIC_OR_DEPARTMENT_OTHER): Payer: Medicare Other

## 2012-10-22 ENCOUNTER — Encounter (HOSPITAL_COMMUNITY): Payer: Medicare Other

## 2012-10-22 VITALS — BP 132/77 | HR 89 | Resp 16

## 2012-10-22 DIAGNOSIS — D649 Anemia, unspecified: Secondary | ICD-10-CM

## 2012-10-22 LAB — RENAL FUNCTION PANEL
BUN: 41 mg/dL — ABNORMAL HIGH (ref 6–23)
CO2: 25 mEq/L (ref 19–32)
Calcium: 9 mg/dL (ref 8.4–10.5)
Chloride: 104 mEq/L (ref 96–112)
Creatinine, Ser: 3.84 mg/dL — ABNORMAL HIGH (ref 0.50–1.10)
GFR calc non Af Amer: 11 mL/min — ABNORMAL LOW (ref >90)

## 2012-10-22 LAB — HEMOGLOBIN AND HEMATOCRIT, BLOOD: HCT: 29.8 % — ABNORMAL LOW (ref 36.0–46.0)

## 2012-10-22 MED ORDER — EPOETIN ALFA 10000 UNIT/ML IJ SOLN
8000.0000 [IU] | Freq: Once | INTRAMUSCULAR | Status: AC
  Administered 2012-10-22: 8000 [IU] via SUBCUTANEOUS

## 2012-10-22 NOTE — Progress Notes (Signed)
Sabrina Beck presents today for injection per MD orders. Procrit 8000 units administered SQ in left Abdomen. Administration without incident. Patient tolerated well.

## 2012-10-22 NOTE — Progress Notes (Signed)
Labs drawn today for hh and renal

## 2012-11-05 ENCOUNTER — Encounter (HOSPITAL_COMMUNITY): Payer: Medicare Other | Attending: Nephrology

## 2012-11-05 ENCOUNTER — Encounter (HOSPITAL_COMMUNITY): Payer: Medicare Other

## 2012-11-05 VITALS — BP 138/72 | HR 72 | Resp 16

## 2012-11-05 DIAGNOSIS — N039 Chronic nephritic syndrome with unspecified morphologic changes: Secondary | ICD-10-CM | POA: Insufficient documentation

## 2012-11-05 DIAGNOSIS — D631 Anemia in chronic kidney disease: Secondary | ICD-10-CM | POA: Insufficient documentation

## 2012-11-05 DIAGNOSIS — N189 Chronic kidney disease, unspecified: Secondary | ICD-10-CM | POA: Insufficient documentation

## 2012-11-05 DIAGNOSIS — D649 Anemia, unspecified: Secondary | ICD-10-CM

## 2012-11-05 MED ORDER — EPOETIN ALFA 10000 UNIT/ML IJ SOLN
INTRAMUSCULAR | Status: AC
  Filled 2012-11-05: qty 1

## 2012-11-05 MED ORDER — EPOETIN ALFA 10000 UNIT/ML IJ SOLN
8000.0000 [IU] | Freq: Once | INTRAMUSCULAR | Status: AC
  Administered 2012-11-05: 8000 [IU] via SUBCUTANEOUS

## 2012-11-05 NOTE — Progress Notes (Signed)
Sabrina Beck presents today for injection per MD orders. Procrit 8000 units administered SQ in left Abdomen. Administration without incident. Patient tolerated well.

## 2012-11-19 ENCOUNTER — Encounter (HOSPITAL_COMMUNITY): Payer: Medicare Other

## 2012-11-19 ENCOUNTER — Encounter (HOSPITAL_BASED_OUTPATIENT_CLINIC_OR_DEPARTMENT_OTHER): Payer: Medicare Other

## 2012-11-19 ENCOUNTER — Other Ambulatory Visit (HOSPITAL_COMMUNITY): Payer: Self-pay | Admitting: Oncology

## 2012-11-19 VITALS — BP 144/74 | HR 100 | Resp 18

## 2012-11-19 DIAGNOSIS — D649 Anemia, unspecified: Secondary | ICD-10-CM

## 2012-11-19 LAB — RENAL FUNCTION PANEL
CO2: 25 mEq/L (ref 19–32)
Calcium: 8.6 mg/dL (ref 8.4–10.5)
Chloride: 105 mEq/L (ref 96–112)
GFR calc Af Amer: 13 mL/min — ABNORMAL LOW (ref >90)
GFR calc non Af Amer: 11 mL/min — ABNORMAL LOW (ref >90)
Glucose, Bld: 159 mg/dL — ABNORMAL HIGH (ref 70–99)
Potassium: 3.3 mEq/L — ABNORMAL LOW (ref 3.5–5.1)
Sodium: 141 mEq/L (ref 135–145)

## 2012-11-19 LAB — HEMOGLOBIN AND HEMATOCRIT, BLOOD: HCT: 29.5 % — ABNORMAL LOW (ref 36.0–46.0)

## 2012-11-19 MED ORDER — EPOETIN ALFA 10000 UNIT/ML IJ SOLN
INTRAMUSCULAR | Status: AC
  Filled 2012-11-19: qty 1

## 2012-11-19 MED ORDER — EPOETIN ALFA 10000 UNIT/ML IJ SOLN
8000.0000 [IU] | Freq: Once | INTRAMUSCULAR | Status: AC
  Administered 2012-11-19: 8000 [IU] via SUBCUTANEOUS

## 2012-11-19 NOTE — Progress Notes (Signed)
Labs drawn today for H&H and renal

## 2012-11-19 NOTE — Progress Notes (Signed)
Sabrina Beck presents today for injection per MD orders. Procrit 8000 units administered SQ in left Abdomen. Administration without incident. Patient tolerated well.

## 2012-12-03 ENCOUNTER — Other Ambulatory Visit (HOSPITAL_COMMUNITY): Payer: Medicare Other

## 2012-12-03 ENCOUNTER — Encounter (HOSPITAL_COMMUNITY): Payer: Medicare Other

## 2012-12-03 VITALS — BP 131/91 | HR 92

## 2012-12-03 DIAGNOSIS — D649 Anemia, unspecified: Secondary | ICD-10-CM

## 2012-12-03 MED ORDER — EPOETIN ALFA 10000 UNIT/ML IJ SOLN
INTRAMUSCULAR | Status: AC
  Filled 2012-12-03: qty 1

## 2012-12-03 MED ORDER — EPOETIN ALFA 10000 UNIT/ML IJ SOLN
8000.0000 [IU] | Freq: Once | INTRAMUSCULAR | Status: AC
  Administered 2012-12-03: 8000 [IU] via SUBCUTANEOUS

## 2012-12-03 NOTE — Progress Notes (Signed)
Sabrina Beck presents today for injection per MD orders. Procrit 8000 units administered SQ in left Abdomen. Talked to Dr Florentina Addison nurse who confirmed dose of 8000 units today and no labs today. Patient is to start 10000 units monthly with labs monthly beginning with next trip in one month. Administration without incident. Patient tolerated well.

## 2012-12-17 ENCOUNTER — Other Ambulatory Visit (HOSPITAL_COMMUNITY): Payer: Medicare Other

## 2012-12-17 ENCOUNTER — Ambulatory Visit (HOSPITAL_COMMUNITY): Payer: Medicare Other

## 2013-01-02 ENCOUNTER — Encounter (HOSPITAL_COMMUNITY): Payer: Medicare Other | Attending: Nephrology

## 2013-01-02 ENCOUNTER — Encounter (HOSPITAL_COMMUNITY): Payer: Medicare Other

## 2013-01-02 VITALS — BP 115/81 | HR 105

## 2013-01-02 DIAGNOSIS — D631 Anemia in chronic kidney disease: Secondary | ICD-10-CM | POA: Insufficient documentation

## 2013-01-02 DIAGNOSIS — D649 Anemia, unspecified: Secondary | ICD-10-CM

## 2013-01-02 DIAGNOSIS — N039 Chronic nephritic syndrome with unspecified morphologic changes: Secondary | ICD-10-CM | POA: Insufficient documentation

## 2013-01-02 DIAGNOSIS — N189 Chronic kidney disease, unspecified: Secondary | ICD-10-CM | POA: Insufficient documentation

## 2013-01-02 LAB — RENAL FUNCTION PANEL
Albumin: 3.6 g/dL (ref 3.5–5.2)
BUN: 49 mg/dL — ABNORMAL HIGH (ref 6–23)
Chloride: 103 mEq/L (ref 96–112)
GFR calc non Af Amer: 12 mL/min — ABNORMAL LOW (ref >90)
Phosphorus: 4.5 mg/dL (ref 2.3–4.6)
Potassium: 3.5 mEq/L (ref 3.5–5.1)
Sodium: 138 mEq/L (ref 135–145)

## 2013-01-02 MED ORDER — EPOETIN ALFA 10000 UNIT/ML IJ SOLN
INTRAMUSCULAR | Status: AC
  Filled 2013-01-02: qty 1

## 2013-01-02 MED ORDER — EPOETIN ALFA 10000 UNIT/ML IJ SOLN
10000.0000 [IU] | Freq: Once | INTRAMUSCULAR | Status: AC
  Administered 2013-01-02: 10000 [IU] via SUBCUTANEOUS

## 2013-01-02 NOTE — Progress Notes (Signed)
Labs drawn today for hh and renal

## 2013-01-02 NOTE — Progress Notes (Signed)
Sabrina Beck presents today for injection per MD orders. Procrit 10000 units administered SQ in left Abdomen. Administration without incident. Patient tolerated well.

## 2013-01-30 ENCOUNTER — Ambulatory Visit (HOSPITAL_COMMUNITY): Payer: Medicare Other

## 2013-01-31 ENCOUNTER — Encounter (HOSPITAL_COMMUNITY)
Admission: RE | Admit: 2013-01-31 | Discharge: 2013-01-31 | Disposition: A | Discharge status: Home / Self Care | Payer: Medicare Other | Source: Ambulatory Visit | Attending: Nephrology | Admitting: Nephrology

## 2013-01-31 ENCOUNTER — Ambulatory Visit (HOSPITAL_COMMUNITY): Payer: Medicare Other

## 2013-01-31 ENCOUNTER — Other Ambulatory Visit (HOSPITAL_COMMUNITY): Payer: Medicare Other

## 2013-01-31 DIAGNOSIS — D631 Anemia in chronic kidney disease: Secondary | ICD-10-CM | POA: Insufficient documentation

## 2013-01-31 DIAGNOSIS — N189 Chronic kidney disease, unspecified: Secondary | ICD-10-CM | POA: Insufficient documentation

## 2013-01-31 LAB — HEMOGLOBIN AND HEMATOCRIT, BLOOD
HCT: 27.1 % — ABNORMAL LOW (ref 36.0–46.0)
Hemoglobin: 9.2 g/dL — ABNORMAL LOW (ref 12.0–15.0)

## 2013-01-31 LAB — RENAL FUNCTION PANEL
Albumin: 3.4 g/dL — ABNORMAL LOW (ref 3.5–5.2)
BUN: 46 mg/dL — ABNORMAL HIGH (ref 6–23)
CO2: 22 mEq/L (ref 19–32)
Calcium: 8.7 mg/dL (ref 8.4–10.5)
Chloride: 104 mEq/L (ref 96–112)
Creatinine, Ser: 3.68 mg/dL — ABNORMAL HIGH (ref 0.50–1.10)
GFR calc non Af Amer: 11 mL/min — ABNORMAL LOW (ref >90)

## 2013-01-31 MED ORDER — EPOETIN ALFA 10000 UNIT/ML IJ SOLN
10000.0000 [IU] | INTRAMUSCULAR | Status: AC
  Administered 2013-01-31: 10000 [IU] via SUBCUTANEOUS
  Filled 2013-01-31: qty 1

## 2013-01-31 NOTE — Progress Notes (Signed)
   Hemoglobin Hemoglobin and hematocrit, blood       Collected: 01/31/13 0830   Resulting lab: [facility]   Reference range: 12.0 - 15.0 g/dL   Value: 9.2 (Low)                        Results Hemoglobin and hematocrit, blood (Order [id number])         Entry Date    01/31/2013      Component Results    Component Value Flag Range & Units Status   Hemoglobin 9.2 (L) 12.0 - 15.0 g/dL Final   HCT 27.1 (L) 36.0 - 46.0 % Final                               Result Information        Abnormal    Status: Final result (01/31/2013 8:46 AM)    Provider Status: Ordered  Encounter     View Encounter           Lab Information    [facility]                                 Order-Level Documents:    There are no order-level documents.                  Hemoglobin and hematocrit, blood (Order [id number]) Released By: Auto-released Date: 01/31/2013  Lab Authorizing: Harriett Sine, MD Department: [facility]  Order: [id number]              Order Information    Order Date/Time Release Date/Time Start Date/Time End Date/Time   01/31/2013 8:28 AM 01/31/2013 8:28 AM 01/31/2013 8:29 AM 01/31/2013 8:29 AM               Order Details    Frequency Duration Priority Order Class   Once 1 occurrence Routine Lab Collect          Acc#   [id number]               Order History Inpatient    Date/Time Action Taken User Additional Information   01/31/13 0828 Release Molli Hazard, RN From Order: [id number]   01/31/13 0836 Result Lab In [facility] Interface In process   01/31/13 0846 Result Lab In [facility] Interface Final         Order Requisition    Hemoglobin and hematocrit, blood (Order [id number]) on 01/31/13            Collection Information    Collected: 01/31/2013 8:30 AM Resulting Agency: [id number]            Additional Information    Associated Reports   View Encounter   Priority and Order Details   Collection Information.

## 2013-01-31 NOTE — Progress Notes (Signed)
Pt arrived in Short Stay for Procrit SQ injection for chronic anemia and renal failure per order Dr Lowanda Foster. H&H and renal panel drawn and sent to lab. Med given in abdominal tissue after obtaining blood results. D/C ambulatory in satisfactory condition.

## 2013-03-03 ENCOUNTER — Encounter (HOSPITAL_COMMUNITY)
Admission: RE | Admit: 2013-03-03 | Discharge: 2013-03-03 | Disposition: A | Discharge status: Home / Self Care | Payer: Medicare Other | Source: Ambulatory Visit | Attending: Nephrology | Admitting: Nephrology

## 2013-03-03 DIAGNOSIS — N039 Chronic nephritic syndrome with unspecified morphologic changes: Secondary | ICD-10-CM | POA: Insufficient documentation

## 2013-03-03 DIAGNOSIS — N189 Chronic kidney disease, unspecified: Secondary | ICD-10-CM | POA: Insufficient documentation

## 2013-03-03 DIAGNOSIS — D631 Anemia in chronic kidney disease: Secondary | ICD-10-CM | POA: Insufficient documentation

## 2013-03-03 LAB — RENAL FUNCTION PANEL
Calcium: 8.9 mg/dL (ref 8.4–10.5)
Creatinine, Ser: 3.73 mg/dL — ABNORMAL HIGH (ref 0.50–1.10)
GFR calc Af Amer: 13 mL/min — ABNORMAL LOW (ref >90)
GFR calc non Af Amer: 11 mL/min — ABNORMAL LOW (ref >90)
Phosphorus: 4.4 mg/dL (ref 2.3–4.6)
Sodium: 140 mEq/L (ref 135–145)

## 2013-03-03 MED ORDER — EPOETIN ALFA 10000 UNIT/ML IJ SOLN
INTRAMUSCULAR | Status: AC
  Filled 2013-03-03: qty 1

## 2013-03-03 MED ORDER — EPOETIN ALFA 10000 UNIT/ML IJ SOLN
10000.0000 [IU] | INTRAMUSCULAR | Status: DC
  Administered 2013-03-03: 10000 [IU] via SUBCUTANEOUS

## 2013-03-03 NOTE — Progress Notes (Addendum)
Results for Sabrina Beck, Sabrina Beck (MRN [id number]) as of 03/03/2013 09:33 Labs prior to Procrit injection. Tolerated well.  Ref. Range 03/03/2013 08:04  Sodium Latest Range: 135-145 mEq/L 140  Potassium Latest Range: 3.5-5.1 mEq/L 3.6  Chloride Latest Range: 96-112 mEq/L 106  CO2 Latest Range: 19-32 mEq/L 22  BUN Latest Range: 6-23 mg/dL 47 (H)  Creatinine Latest Range: 0.50-1.10 mg/dL 3.73 (H)  Calcium Latest Range: 8.4-10.5 mg/dL 8.9  GFR calc non Af Amer Latest Range: >90 mL/min 11 (L)  GFR calc Af Amer Latest Range: >90 mL/min 13 (L)  Glucose Latest Range: 70-99 mg/dL 131 (H)  Phosphorus Latest Range: 2.3-4.6 mg/dL 4.4  Albumin Latest Range: 3.5-5.2 g/dL 3.4 (L)  Results for Sabrina Beck, Sabrina Beck (MRN [id number]) as of 03/03/2013 09:33  Ref. Range 03/03/2013 08:04  Hemoglobin Latest Range: 12.0-15.0 g/dL 8.9 (L)  HCT Latest Range: 36.0-46.0 % 27.4 (L)  Glucose Latest Range: 70-99 mg/dL 131 (H)  Labs faxed to [facility]

## 2013-03-26 ENCOUNTER — Other Ambulatory Visit: Payer: Self-pay | Admitting: Cardiovascular Disease

## 2013-03-27 ENCOUNTER — Other Ambulatory Visit: Payer: Self-pay | Admitting: Pharmacist Clinician (PhC)/ Clinical Pharmacy Specialist

## 2013-03-27 DIAGNOSIS — Z7901 Long term (current) use of anticoagulants: Secondary | ICD-10-CM

## 2013-03-27 DIAGNOSIS — I4891 Unspecified atrial fibrillation: Secondary | ICD-10-CM

## 2013-03-27 MED ORDER — WARFARIN SODIUM 5 MG PO TABS: ORAL_TABLET | ORAL | Status: DC

## 2013-03-31 ENCOUNTER — Telehealth: Payer: Self-pay | Admitting: Cardiovascular Disease

## 2013-03-31 NOTE — Telephone Encounter (Signed)
[place] stated pt arrived w/o orders.  Informed RN will need to request chart to review and call back.  Reviewed last OV note ([place] patient) and no documentation of labs or tests to be completed.  Call back to [place] w/ [facility] and spoke w/ Maudie Mercury.  Informed of this.  Verbalized understanding and stated they will call pt to inform.

## 2013-04-03 ENCOUNTER — Encounter (HOSPITAL_COMMUNITY)
Admission: RE | Admit: 2013-04-03 | Discharge: 2013-04-03 | Disposition: A | Discharge status: Home / Self Care | Payer: Medicare Other | Source: Ambulatory Visit | Attending: Nephrology | Admitting: Nephrology

## 2013-04-03 DIAGNOSIS — D631 Anemia in chronic kidney disease: Secondary | ICD-10-CM | POA: Insufficient documentation

## 2013-04-03 DIAGNOSIS — N189 Chronic kidney disease, unspecified: Secondary | ICD-10-CM | POA: Insufficient documentation

## 2013-04-03 LAB — RENAL FUNCTION PANEL
CO2: 22 mEq/L (ref 19–32)
Chloride: 105 mEq/L (ref 96–112)
Creatinine, Ser: 4.12 mg/dL — ABNORMAL HIGH (ref 0.50–1.10)
GFR calc non Af Amer: 10 mL/min — ABNORMAL LOW (ref >90)
Potassium: 3.8 mEq/L (ref 3.5–5.1)

## 2013-04-03 LAB — HEMOGLOBIN AND HEMATOCRIT, BLOOD: HCT: 26.8 % — ABNORMAL LOW (ref 36.0–46.0)

## 2013-04-03 MED ORDER — EPOETIN ALFA 10000 UNIT/ML IJ SOLN
INTRAMUSCULAR | Status: AC
  Filled 2013-04-03: qty 2

## 2013-04-03 MED ORDER — EPOETIN ALFA 10000 UNIT/ML IJ SOLN
12000.0000 [IU] | INTRAMUSCULAR | Status: DC
  Administered 2013-04-03: 12000 [IU] via SUBCUTANEOUS

## 2013-04-03 NOTE — Progress Notes (Signed)
Results for Sabrina Beck, Sabrina Beck (MRN [id number]) as of 04/03/2013 14:41  Ref. Range 04/03/2013 10:50  Sodium Latest Range: 135-145 mEq/L 140  Potassium Latest Range: 3.5-5.1 mEq/L 3.8  Chloride Latest Range: 96-112 mEq/L 105  CO2 Latest Range: 19-32 mEq/L 22  BUN Latest Range: 6-23 mg/dL 47 (H)  Creatinine Latest Range: 0.50-1.10 mg/dL 4.12 (H)  Calcium Latest Range: 8.4-10.5 mg/dL 8.6  GFR calc non Af Amer Latest Range: >90 mL/min 10 (L)  GFR calc Af Amer Latest Range: >90 mL/min 11 (L)  Glucose Latest Range: 70-99 mg/dL 159 (H)  Phosphorus Latest Range: 2.3-4.6 mg/dL 4.9 (H)  Albumin Latest Range: 3.5-5.2 g/dL 3.4 (L)  Results for Sabrina Beck, Sabrina Beck (MRN [id number]) as of 04/03/2013 14:41  Ref. Range 04/03/2013 10:50  Hemoglobin Latest Range: 12.0-15.0 g/dL 8.9 (L)  HCT Latest Range: 36.0-46.0 % 26.8 (L)  Procrit 12,000 units given

## 2013-04-11 ENCOUNTER — Other Ambulatory Visit: Payer: Self-pay | Admitting: Cardiovascular Disease

## 2013-04-14 NOTE — Telephone Encounter (Signed)
Rx was sent to pharmacy electronically. 

## 2013-04-25 ENCOUNTER — Other Ambulatory Visit: Payer: Self-pay | Admitting: Cardiovascular Disease

## 2013-04-25 NOTE — Telephone Encounter (Signed)
Rx was sent to pharmacy electronically. 

## 2013-04-28 ENCOUNTER — Telehealth: Payer: Self-pay | Admitting: Cardiovascular Disease

## 2013-04-28 MED ORDER — FENOFIBRATE 145 MG PO TABS: 145.0000 mg | ORAL_TABLET | Freq: Every day | ORAL | Status: DC

## 2013-04-28 NOTE — Telephone Encounter (Signed)
Mrs. Oppliger want to know if she can get  Her coumadin check at her primary , which is [facility]. She has not had it checked since the office closed in [place] .Marland Kitchen Please call if you have any questions .Marland Kitchen   Thanks

## 2013-04-28 NOTE — Telephone Encounter (Signed)
Rx was sent to pharmacy electronically. There must have been a mix-up. Patient is compliant with her appointments and isn't due to see Dr. Loletha Grayer until February.

## 2013-04-28 NOTE — Addendum Note (Signed)
Addended by: Diana Eves on: 04/28/2013 09:28 AM   Modules accepted: Orders

## 2013-04-28 NOTE — Telephone Encounter (Signed)
Will have patient get INR checked at [facility] after Oct 1. Pt voiced understanding

## 2013-05-05 ENCOUNTER — Encounter (HOSPITAL_COMMUNITY)
Admission: RE | Admit: 2013-05-05 | Discharge: 2013-05-05 | Disposition: A | Discharge status: Home / Self Care | Payer: Medicare Other | Source: Ambulatory Visit | Attending: Nephrology | Admitting: Nephrology

## 2013-05-05 DIAGNOSIS — N189 Chronic kidney disease, unspecified: Secondary | ICD-10-CM | POA: Insufficient documentation

## 2013-05-05 DIAGNOSIS — D631 Anemia in chronic kidney disease: Secondary | ICD-10-CM | POA: Insufficient documentation

## 2013-05-05 LAB — HEMOGLOBIN AND HEMATOCRIT, BLOOD
HCT: 26.7 % — ABNORMAL LOW (ref 36.0–46.0)
Hemoglobin: 8.9 g/dL — ABNORMAL LOW (ref 12.0–15.0)

## 2013-05-05 LAB — RENAL FUNCTION PANEL
Albumin: 3.4 g/dL — ABNORMAL LOW (ref 3.5–5.2)
BUN: 50 mg/dL — ABNORMAL HIGH (ref 6–23)
Chloride: 106 mEq/L (ref 96–112)
GFR calc Af Amer: 12 mL/min — ABNORMAL LOW (ref >90)
GFR calc non Af Amer: 10 mL/min — ABNORMAL LOW (ref >90)
Phosphorus: 4.3 mg/dL (ref 2.3–4.6)
Potassium: 3.5 mEq/L (ref 3.5–5.1)
Sodium: 141 mEq/L (ref 135–145)

## 2013-05-05 MED ORDER — EPOETIN ALFA 10000 UNIT/ML IJ SOLN
INTRAMUSCULAR | Status: AC
  Filled 2013-05-05: qty 2

## 2013-05-05 MED ORDER — EPOETIN ALFA 10000 UNIT/ML IJ SOLN
12000.0000 [IU] | INTRAMUSCULAR | Status: DC
  Administered 2013-05-05: 12000 [IU] via SUBCUTANEOUS

## 2013-05-07 ENCOUNTER — Ambulatory Visit (INDEPENDENT_AMBULATORY_CARE_PROVIDER_SITE_OTHER): Payer: Medicare Other | Admitting: *Deleted

## 2013-05-07 DIAGNOSIS — Z7901 Long term (current) use of anticoagulants: Secondary | ICD-10-CM

## 2013-05-07 DIAGNOSIS — I4891 Unspecified atrial fibrillation: Secondary | ICD-10-CM

## 2013-05-07 NOTE — Progress Notes (Signed)
Results for KHUSHBOO, NEGA (MRN [id number])   Ref. Range 05/05/2013 08:30 05/05/2013 09:21  Sodium Latest Range: 135-145 mEq/L 141   Potassium Latest Range: 3.5-5.1 mEq/L 3.5   Chloride Latest Range: 96-112 mEq/L 106   CO2 Latest Range: 19-32 mEq/L 23   BUN Latest Range: 6-23 mg/dL 50 (H)   Creatinine Latest Range: 0.50-1.10 mg/dL 3.96 (H)   Calcium Latest Range: 8.4-10.5 mg/dL 8.1 (L)   GFR calc non Af Amer Latest Range: >90 mL/min 10 (L)   GFR calc Af Amer Latest Range: >90 mL/min 12 (L)   Glucose Latest Range: 70-99 mg/dL 143 (H)   Phosphorus Latest Range: 2.3-4.6 mg/dL 4.3   Albumin Latest Range: 3.5-5.2 g/dL 3.4 (L)   Hemoglobin Latest Range: 12.0-15.0 g/dL  8.9 (L)  HCT Latest Range: 36.0-46.0 %  26.7 (L)

## 2013-05-09 ENCOUNTER — Other Ambulatory Visit: Payer: Self-pay | Admitting: Cardiovascular Disease

## 2013-05-09 NOTE — Telephone Encounter (Signed)
Sabrina Beck from [facility] has script from [facility] that is not allowing him to fill due to NPI number.  Thinks he is going to need Dr C approval  Please call

## 2013-05-09 NOTE — Telephone Encounter (Signed)
Rx was sent to pharmacy electronically. 

## 2013-06-02 ENCOUNTER — Encounter (HOSPITAL_COMMUNITY)
Admission: RE | Admit: 2013-06-02 | Discharge: 2013-06-02 | Disposition: A | Discharge status: Home / Self Care | Payer: Medicare Other | Source: Ambulatory Visit | Attending: Nephrology | Admitting: Nephrology

## 2013-06-02 DIAGNOSIS — D631 Anemia in chronic kidney disease: Secondary | ICD-10-CM | POA: Insufficient documentation

## 2013-06-02 DIAGNOSIS — N184 Chronic kidney disease, stage 4 (severe): Secondary | ICD-10-CM | POA: Insufficient documentation

## 2013-06-02 LAB — RENAL FUNCTION PANEL
BUN: 54 mg/dL — ABNORMAL HIGH (ref 6–23)
CO2: 23 mEq/L (ref 19–32)
Calcium: 8.9 mg/dL (ref 8.4–10.5)
Chloride: 105 mEq/L (ref 96–112)
Creatinine, Ser: 4.11 mg/dL — ABNORMAL HIGH (ref 0.50–1.10)
Glucose, Bld: 162 mg/dL — ABNORMAL HIGH (ref 70–99)

## 2013-06-02 LAB — HEMOGLOBIN AND HEMATOCRIT, BLOOD: HCT: 27.3 % — ABNORMAL LOW (ref 36.0–46.0)

## 2013-06-02 MED ORDER — EPOETIN ALFA 10000 UNIT/ML IJ SOLN
INTRAMUSCULAR | Status: AC
  Filled 2013-06-02: qty 1

## 2013-06-02 MED ORDER — EPOETIN ALFA 10000 UNIT/ML IJ SOLN
12000.0000 [IU] | INTRAMUSCULAR | Status: DC
  Administered 2013-06-02: 12000 [IU] via SUBCUTANEOUS

## 2013-06-03 NOTE — Progress Notes (Signed)
Results for Sabrina Beck, Sabrina Beck (MRN [id number]) as of 06/03/2013 07:28  Ref. Range 06/02/2013 09:20  Sodium Latest Range: 135-145 mEq/L 141  Potassium Latest Range: 3.5-5.1 mEq/L 3.7  Chloride Latest Range: 96-112 mEq/L 105  CO2 Latest Range: 19-32 mEq/L 23  BUN Latest Range: 6-23 mg/dL 54 (H)  Creatinine Latest Range: 0.50-1.10 mg/dL 4.11 (H)  Calcium Latest Range: 8.4-10.5 mg/dL 8.9  GFR calc non Af Amer Latest Range: >90 mL/min 10 (L)  GFR calc Af Amer Latest Range: >90 mL/min 11 (L)  Glucose Latest Range: 70-99 mg/dL 162 (H)  Phosphorus Latest Range: 2.3-4.6 mg/dL 4.6  Albumin Latest Range: 3.5-5.2 g/dL 3.4 (L)  Results for Sabrina Beck, Sabrina Beck (MRN [id number]) as of 06/03/2013 07:28  Ref. Range 06/02/2013 08:20  Hemoglobin Latest Range: 12.0-15.0 g/dL 8.9 (L)  HCT Latest Range: 36.0-46.0 % 27.3 (L)  Procrit 12,000 units given SQ

## 2013-06-04 ENCOUNTER — Ambulatory Visit (INDEPENDENT_AMBULATORY_CARE_PROVIDER_SITE_OTHER): Payer: Medicare Other | Admitting: *Deleted

## 2013-06-04 DIAGNOSIS — Z7901 Long term (current) use of anticoagulants: Secondary | ICD-10-CM

## 2013-06-04 DIAGNOSIS — I4891 Unspecified atrial fibrillation: Secondary | ICD-10-CM

## 2013-06-09 ENCOUNTER — Other Ambulatory Visit: Payer: Self-pay | Admitting: Pharmacist Clinician (PhC)/ Clinical Pharmacy Specialist

## 2013-06-09 DIAGNOSIS — I4891 Unspecified atrial fibrillation: Secondary | ICD-10-CM

## 2013-06-09 DIAGNOSIS — Z7901 Long term (current) use of anticoagulants: Secondary | ICD-10-CM

## 2013-06-09 MED ORDER — WARFARIN SODIUM 5 MG PO TABS: ORAL_TABLET | ORAL | Status: DC

## 2013-06-30 ENCOUNTER — Encounter (HOSPITAL_COMMUNITY)
Admission: RE | Admit: 2013-06-30 | Discharge: 2013-06-30 | Disposition: A | Discharge status: Home / Self Care | Payer: Medicare Other | Source: Ambulatory Visit | Attending: Nephrology | Admitting: Nephrology

## 2013-06-30 DIAGNOSIS — D631 Anemia in chronic kidney disease: Secondary | ICD-10-CM | POA: Insufficient documentation

## 2013-06-30 DIAGNOSIS — N184 Chronic kidney disease, stage 4 (severe): Secondary | ICD-10-CM | POA: Insufficient documentation

## 2013-06-30 LAB — RENAL FUNCTION PANEL
BUN: 54 mg/dL — ABNORMAL HIGH (ref 6–23)
CO2: 22 mEq/L (ref 19–32)
Chloride: 106 mEq/L (ref 96–112)
GFR calc Af Amer: 11 mL/min — ABNORMAL LOW (ref >90)
Glucose, Bld: 159 mg/dL — ABNORMAL HIGH (ref 70–99)
Phosphorus: 4.2 mg/dL (ref 2.3–4.6)
Potassium: 3.5 mEq/L (ref 3.5–5.1)
Sodium: 142 mEq/L (ref 135–145)

## 2013-06-30 LAB — HEMOGLOBIN AND HEMATOCRIT, BLOOD
HCT: 27.9 % — ABNORMAL LOW (ref 36.0–46.0)
Hemoglobin: 9.1 g/dL — ABNORMAL LOW (ref 12.0–15.0)

## 2013-06-30 MED ORDER — EPOETIN ALFA 10000 UNIT/ML IJ SOLN
INTRAMUSCULAR | Status: AC
  Filled 2013-06-30: qty 2

## 2013-06-30 MED ORDER — EPOETIN ALFA 10000 UNIT/ML IJ SOLN
12000.0000 [IU] | INTRAMUSCULAR | Status: DC
  Administered 2013-06-30: 12000 [IU] via SUBCUTANEOUS

## 2013-06-30 NOTE — Progress Notes (Signed)
Results for TAZIYAH, KOPLIN (MRN [id number]) as of 06/30/2013 11:25  Ref. Range 06/30/2013 09:05  Sodium Latest Range: 135-145 mEq/L 142  Potassium Latest Range: 3.5-5.1 mEq/L 3.5  Chloride Latest Range: 96-112 mEq/L 106  CO2 Latest Range: 19-32 mEq/L 22  BUN Latest Range: 6-23 mg/dL 54 (H)  Creatinine Latest Range: 0.50-1.10 mg/dL 4.25 (H)  Calcium Latest Range: 8.4-10.5 mg/dL 8.4  GFR calc non Af Amer Latest Range: >90 mL/min 9 (L)  GFR calc Af Amer Latest Range: >90 mL/min 11 (L)  Glucose Latest Range: 70-99 mg/dL 159 (H)  Phosphorus Latest Range: 2.3-4.6 mg/dL 4.2  Albumin Latest Range: 3.5-5.2 g/dL 3.4 (L)  Hemoglobin Latest Range: 12.0-15.0 g/dL 9.1 (L)  HCT    Latest Range: 36.0-46.0 % 27.9 (L)  Procrit 12000 units given per MD order.

## 2013-07-07 ENCOUNTER — Ambulatory Visit (INDEPENDENT_AMBULATORY_CARE_PROVIDER_SITE_OTHER): Payer: Medicare Other | Admitting: *Deleted

## 2013-07-07 DIAGNOSIS — I4891 Unspecified atrial fibrillation: Secondary | ICD-10-CM

## 2013-07-07 DIAGNOSIS — Z7901 Long term (current) use of anticoagulants: Secondary | ICD-10-CM

## 2013-07-11 ENCOUNTER — Other Ambulatory Visit: Payer: Self-pay | Admitting: Cardiovascular Disease

## 2013-07-11 ENCOUNTER — Other Ambulatory Visit: Payer: Self-pay | Admitting: Obstetrics and Gynecology

## 2013-07-11 NOTE — Telephone Encounter (Signed)
Rx was sent to pharmacy electronically. 

## 2013-07-16 ENCOUNTER — Other Ambulatory Visit (HOSPITAL_COMMUNITY): Payer: Self-pay | Admitting: Family Medicine

## 2013-07-16 DIAGNOSIS — Z139 Encounter for screening, unspecified: Secondary | ICD-10-CM

## 2013-07-21 ENCOUNTER — Ambulatory Visit (HOSPITAL_COMMUNITY)
Admission: RE | Admit: 2013-07-21 | Discharge: 2013-07-21 | Disposition: A | Discharge status: Home / Self Care | Payer: Medicare Other | Source: Ambulatory Visit | Attending: Family Medicine | Admitting: Family Medicine

## 2013-07-21 DIAGNOSIS — Z139 Encounter for screening, unspecified: Secondary | ICD-10-CM

## 2013-07-21 DIAGNOSIS — Z1231 Encounter for screening mammogram for malignant neoplasm of breast: Secondary | ICD-10-CM | POA: Insufficient documentation

## 2013-07-28 ENCOUNTER — Encounter (HOSPITAL_COMMUNITY)
Admission: RE | Admit: 2013-07-28 | Discharge: 2013-07-28 | Disposition: A | Discharge status: Home / Self Care | Payer: Medicare Other | Source: Ambulatory Visit | Attending: Nephrology | Admitting: Nephrology

## 2013-07-28 DIAGNOSIS — N185 Chronic kidney disease, stage 5: Secondary | ICD-10-CM | POA: Insufficient documentation

## 2013-07-28 DIAGNOSIS — D631 Anemia in chronic kidney disease: Secondary | ICD-10-CM | POA: Insufficient documentation

## 2013-07-28 LAB — RENAL FUNCTION PANEL
BUN: 46 mg/dL — ABNORMAL HIGH (ref 6–23)
CO2: 22 mEq/L (ref 19–32)
Calcium: 8.9 mg/dL (ref 8.4–10.5)
Chloride: 103 mEq/L (ref 96–112)
GFR calc Af Amer: 12 mL/min — ABNORMAL LOW (ref >90)
GFR calc non Af Amer: 10 mL/min — ABNORMAL LOW (ref >90)
Glucose, Bld: 124 mg/dL — ABNORMAL HIGH (ref 70–99)
Potassium: 3.4 mEq/L — ABNORMAL LOW (ref 3.5–5.1)
Sodium: 140 mEq/L (ref 135–145)

## 2013-07-28 LAB — CBC
Hemoglobin: 9.4 g/dL — ABNORMAL LOW (ref 12.0–15.0)
MCH: 32.2 pg (ref 26.0–34.0)
RBC: 2.92 MIL/uL — ABNORMAL LOW (ref 3.87–5.11)
WBC: 7 10*3/uL (ref 4.0–10.5)

## 2013-07-28 MED ORDER — EPOETIN ALFA 10000 UNIT/ML IJ SOLN
INTRAMUSCULAR | Status: AC
  Filled 2013-07-28: qty 2

## 2013-07-28 MED ORDER — EPOETIN ALFA 10000 UNIT/ML IJ SOLN
12000.0000 [IU] | INTRAMUSCULAR | Status: DC
  Administered 2013-07-28: 12000 [IU] via SUBCUTANEOUS

## 2013-07-28 NOTE — Progress Notes (Signed)
Here for labs and procrit if needed.

## 2013-07-28 NOTE — Progress Notes (Signed)
Results for MARENA, BUCKHANNON (MRN [id number]) as of 07/28/2013 10:04  Ref. Range 07/28/2013 09:07 07/28/2013 09:08  Sodium Latest Range: 135-145 mEq/L  140  Potassium Latest Range: 3.5-5.1 mEq/L  3.4 (L)  Chloride Latest Range: 96-112 mEq/L  103  CO2 Latest Range: 19-32 mEq/L  22  BUN Latest Range: 6-23 mg/dL  46 (H)  Creatinine Latest Range: 0.50-1.10 mg/dL  3.95 (H)  Calcium Latest Range: 8.4-10.5 mg/dL  8.9  GFR calc non Af Amer Latest Range: >90 mL/min  10 (L)  GFR calc Af Amer Latest Range: >90 mL/min  12 (L)  Glucose Latest Range: 70-99 mg/dL  124 (H)  Phosphorus Latest Range: 2.3-4.6 mg/dL  3.8  Albumin Latest Range: 3.5-5.2 g/dL  3.3 (L)  WBC Latest Range: 4.0-10.5 K/uL 7.0   RBC Latest Range: 3.87-5.11 MIL/uL 2.92 (L)   Hemoglobin Latest Range: 12.0-15.0 g/dL 9.4 (L)   HCT Latest Range: 36.0-46.0 % 28.4 (L)   MCV Latest Range: 78.0-100.0 fL 97.3   MCH Latest Range: 26.0-34.0 pg 32.2   MCHC Latest Range: 30.0-36.0 g/dL 33.1   RDW Latest Range: 11.5-15.5 % 14.2   Platelets Latest Range: 150-400 K/uL 217   Procrit 12,000 units given sq right lower abd. R Erikka Follmer RN

## 2013-07-28 NOTE — Progress Notes (Signed)
hgb 9.4, hct 28.4. Procrit 12,000 units SQ right lower abd given. Tolerated well. D/c to home in good condition.

## 2013-07-28 NOTE — Progress Notes (Signed)
Cbc and renal panel drawn and sent to lab for results.

## 2013-08-06 ENCOUNTER — Ambulatory Visit (INDEPENDENT_AMBULATORY_CARE_PROVIDER_SITE_OTHER): Payer: Medicare Other | Admitting: *Deleted

## 2013-08-06 DIAGNOSIS — I4891 Unspecified atrial fibrillation: Secondary | ICD-10-CM

## 2013-08-06 DIAGNOSIS — Z7901 Long term (current) use of anticoagulants: Secondary | ICD-10-CM

## 2013-08-06 LAB — POCT INR: INR: 2.5

## 2013-08-29 ENCOUNTER — Encounter (HOSPITAL_COMMUNITY)
Admission: RE | Admit: 2013-08-29 | Discharge: 2013-08-29 | Disposition: A | Discharge status: Home / Self Care | Payer: Medicare HMO | Source: Ambulatory Visit | Attending: Nephrology | Admitting: Nephrology

## 2013-08-29 DIAGNOSIS — N185 Chronic kidney disease, stage 5: Secondary | ICD-10-CM | POA: Insufficient documentation

## 2013-08-29 DIAGNOSIS — D631 Anemia in chronic kidney disease: Secondary | ICD-10-CM | POA: Insufficient documentation

## 2013-08-29 DIAGNOSIS — N039 Chronic nephritic syndrome with unspecified morphologic changes: Principal | ICD-10-CM

## 2013-08-29 LAB — RENAL FUNCTION PANEL
ALBUMIN: 3.5 g/dL (ref 3.5–5.2)
BUN: 53 mg/dL — AB (ref 6–23)
CHLORIDE: 103 meq/L (ref 96–112)
CO2: 21 meq/L (ref 19–32)
Calcium: 8.6 mg/dL (ref 8.4–10.5)
Creatinine, Ser: 4.05 mg/dL — ABNORMAL HIGH (ref 0.50–1.10)
GFR calc Af Amer: 12 mL/min — ABNORMAL LOW (ref >90)
GFR calc non Af Amer: 10 mL/min — ABNORMAL LOW (ref >90)
GLUCOSE: 116 mg/dL — AB (ref 70–99)
POTASSIUM: 3.6 meq/L — AB (ref 3.7–5.3)
Phosphorus: 5.2 mg/dL — ABNORMAL HIGH (ref 2.3–4.6)
Sodium: 141 mEq/L (ref 137–147)

## 2013-08-29 LAB — HEMOGLOBIN AND HEMATOCRIT, BLOOD
HCT: 28.4 % — ABNORMAL LOW (ref 36.0–46.0)
HEMOGLOBIN: 9.2 g/dL — AB (ref 12.0–15.0)

## 2013-08-29 MED ORDER — EPOETIN ALFA 10000 UNIT/ML IJ SOLN
12000.0000 [IU] | INTRAMUSCULAR | Status: DC
  Administered 2013-08-29: 12000 [IU] via SUBCUTANEOUS

## 2013-08-29 MED ORDER — EPOETIN ALFA 10000 UNIT/ML IJ SOLN
INTRAMUSCULAR | Status: AC
  Filled 2013-08-29: qty 2

## 2013-09-03 ENCOUNTER — Ambulatory Visit (INDEPENDENT_AMBULATORY_CARE_PROVIDER_SITE_OTHER): Payer: Medicare HMO | Admitting: *Deleted

## 2013-09-03 DIAGNOSIS — Z5181 Encounter for therapeutic drug level monitoring: Secondary | ICD-10-CM

## 2013-09-03 DIAGNOSIS — I4891 Unspecified atrial fibrillation: Secondary | ICD-10-CM

## 2013-09-03 DIAGNOSIS — Z7901 Long term (current) use of anticoagulants: Secondary | ICD-10-CM

## 2013-09-03 LAB — POCT INR: INR: 2.8

## 2013-09-26 ENCOUNTER — Encounter (HOSPITAL_COMMUNITY)
Admission: RE | Admit: 2013-09-26 | Discharge: 2013-09-26 | Disposition: A | Discharge status: Home / Self Care | Payer: Medicare HMO | Source: Ambulatory Visit | Attending: Nephrology | Admitting: Nephrology

## 2013-09-26 DIAGNOSIS — Z7901 Long term (current) use of anticoagulants: Secondary | ICD-10-CM | POA: Insufficient documentation

## 2013-09-26 DIAGNOSIS — I4891 Unspecified atrial fibrillation: Secondary | ICD-10-CM | POA: Insufficient documentation

## 2013-09-26 DIAGNOSIS — D649 Anemia, unspecified: Secondary | ICD-10-CM | POA: Insufficient documentation

## 2013-09-26 LAB — HEMOGLOBIN AND HEMATOCRIT, BLOOD
HCT: 26.6 % — ABNORMAL LOW (ref 36.0–46.0)
Hemoglobin: 9.1 g/dL — ABNORMAL LOW (ref 12.0–15.0)

## 2013-09-26 LAB — RENAL FUNCTION PANEL
Albumin: 3.5 g/dL (ref 3.5–5.2)
BUN: 40 mg/dL — ABNORMAL HIGH (ref 6–23)
CALCIUM: 8.9 mg/dL (ref 8.4–10.5)
CO2: 23 meq/L (ref 19–32)
Chloride: 107 mEq/L (ref 96–112)
Creatinine, Ser: 4.02 mg/dL — ABNORMAL HIGH (ref 0.50–1.10)
GFR calc non Af Amer: 10 mL/min — ABNORMAL LOW (ref >90)
GFR, EST AFRICAN AMERICAN: 12 mL/min — AB (ref >90)
Glucose, Bld: 105 mg/dL — ABNORMAL HIGH (ref 70–99)
POTASSIUM: 3.9 meq/L (ref 3.7–5.3)
Phosphorus: 3.9 mg/dL (ref 2.3–4.6)
Sodium: 141 mEq/L (ref 137–147)

## 2013-09-26 MED ORDER — EPOETIN ALFA 10000 UNIT/ML IJ SOLN
INTRAMUSCULAR | Status: AC
  Filled 2013-09-26: qty 1

## 2013-09-26 MED ORDER — EPOETIN ALFA 10000 UNIT/ML IJ SOLN
12000.0000 [IU] | INTRAMUSCULAR | Status: DC
  Administered 2013-09-26: 12000 [IU] via SUBCUTANEOUS

## 2013-09-26 NOTE — Progress Notes (Signed)
Results for Sabrina Beck, Sabrina Beck (MRN [id number]) as of 09/26/2013 15:21  Ref. Range 09/26/2013 09:00  Sodium Latest Range: 137-147 mEq/L 141  Potassium Latest Range: 3.7-5.3 mEq/L 3.9  Chloride Latest Range: 96-112 mEq/L 107  CO2 Latest Range: 19-32 mEq/L 23  BUN Latest Range: 6-23 mg/dL 40 (H)  Creatinine Latest Range: 0.50-1.10 mg/dL 4.02 (H)  Calcium Latest Range: 8.4-10.5 mg/dL 8.9  GFR calc non Af Amer Latest Range: >90 mL/min 10 (L)  GFR calc Af Amer Latest Range: >90 mL/min 12 (L)  Glucose Latest Range: 70-99 mg/dL 105 (H)  Phosphorus Latest Range: 2.3-4.6 mg/dL 3.9  Albumin Latest Range: 3.5-5.2 g/dL 3.5  Hemoglobin Latest Range: 12.0-15.0 g/dL 9.1 (L)  HCT Latest Range: 36.0-46.0 % 26.6 (L)

## 2013-09-26 NOTE — Discharge Instructions (Signed)
Peritoneal Dialysis  Dialysis is a procedure that does some of the work healthy kidneys do. Peritoneal dialysis is a kind of dialysis that uses the thin lining of the belly (peritoneum) and a fluid called dialysate to remove wastes, salt, and extra water from the blood. Before beginning peritoneal dialysis you will have surgery to place a thin, plastic tube (catheter) in your belly. The tube will be small, soft, and easy to hide. It will be used to fill your belly with fluid and drain your belly of fluid. HOW DOES IT WORK? At the start of the session your belly will be filled with fluid. Wastes, salt, and extra water in your blood will pass through the thin lining of your belly into the fluid. At the end of the session the fluid will be drained into a bag. Then the belly will be refilled with fluid and the procedure will be repeated. The process of draining fluid from the belly and filling the belly with fluid is called an exchange. The time the fluid stays in your body before you drain it is called a dwell. The dwell varies from person to person. It usually takes from 1.5 3 hours. Peritoneal dialysis is done during the day or at night while you are sleeping. When it is done during the day it is called continuous ambulatory peritoneal dialysis (CAPD). In CAPD, exchanges are done up to 5 times a day. Each exchange takes about 30 40 minutes. You may go about your day normally between exchanges. Peritoneal dialysis that is done at night is called continuous cycling peritoneal dialysis (CCPD). In CCPD, a machine called a cycler performs exchanges while you are sleeping. Sometimes a combination of CAPD and CCPD is needed. PREPARING FOR AN EXCHANGE  Check your blood pressure if told by your doctor.  Warm the fluid bag using a dry heating pad. Leave the fluid in the bag with the cover on while doing this. Do not use a microwave or hot water to warm the fluid bag.  If you are using a machine, make sure it is  set up and programmed correctly.  Keep your tubing free of germs (sterile).  Make sure the tube in your belly and its cap are free of germs.  If you are using a machine, make sure the part that attaches the bag and tubing to the the tube in your belly is free of germs.  Make sure the area around the tube in your belly is free of germs.  Prevent new germs from getting on your tubing.  Wash your hands thoroughly. Use a gel or foam.  Close doors and windows. Turn off any fans. Make sure you are in a space without drafts or air currents. Doing these things reduces the chance of infection.  Wear a mask to cover your nose and mouth. Do this before you wash your hands and touch equipment. Keep the mask on during each exchange.  Examine the fluid bag:  Make sure the bag is the right size. Size information is on the label. Make sure it has the correct liquid.  Gently squeeze the bag to make sure there are no leaks.  Look at the color of the fluid. The fluid should be clear. You should be able to see any writing on the side of the bag clearly through the liquid. Do not use it if it is cloudy.  Check the expiration date. The expiration date is on the label. If it is past the expiration  date, throw the bag away. HOW TO DO AN EXCHANGE These steps show how exchanges are commonly done. The actual way in which an exchange should be done varies from person to person. Always do exchanges exactly the way that your doctor has trained you to. Remember to always wash your hands before touching any tubing. This is very important. It helps prevent infection. CAPD 1. Hang your fluid bag above your belly on the IV pole. 2. Place a drain bag below your belly. 3. Pull out the ring of y-shaped tubing that connects both bags. 4. Uncap the tube that is connected to the tube in your belly (transfer set). Immediately attach it to the y-shaped tubing of the fluid and drain bags. 5. Twist open the clamp on the  transfer set. This will cause the fluid in your belly to drain through the tube that goes to the drain bag (drain line). It usually takes about 20 minutes for all of the fluid to drain. 6. Once the fluid has finished draining, twist lock the clamp of the transfer set. Then clamp the drain line. 7. Break the seal (frangible) on the tubing that is connected to the fluid bag (fill line). Then unclamp the drain line. Air bubbles will flow into the drain line. Make sure your transfer set is still locked so that no air gets into the belly. 8. Count to 5. Then clamp the drain line. 9. Twist the transfer set clamp to open it. Fluid will flow into your belly. This should take about 10 minutes. 10. When this is done, twist the transfer set clamp to lock it. Then clamp the tubing of the fill line. 11. Detach the tubing from the tube in your belly. Cap the transfer set right away. Tape the transfer set to your belly as told. 12. Look at the fluid that has drained. It may look like urine, but it should be clear. 13. Weigh the drain bag. Write down how much was drained. 14. Check your blood pressure, temperature, and pulse if it is the first exchange of the day. 15. Allow the fluid to stay in the belly for as long as told by your doctor. You may go about your day while the fluid is in your belly. If your body cannot go all night without an exchange, a machine may be used to perform exchanges while you sleep. The machine is similar to the one used during CCPD. CCPD 1. When you are ready for bed, put the fluid bags onto the machine. Put on exactly the number of bags that your doctor said to use. 2. The machine has a small cartridge (cassette) with tubing that attaches to each fluid bag and the tube that goes to the patient (patient line). Depending on the number of fluid bags you are to use, all the tubes on the cassette may need to be connected to separate fluid bags. If you use fewer bags than there are tubes on the  cassette, the extra lines will need to be clamped. 3. Insert the cassette with the attached tubing into the front of the machine. 4. Make sure that the drain line extends to the toilet. The tip of the drain line should not touch the water in the toilet bowl. 5. Pull the ring on the tubing from the fluid bag on the heating pad of the machine and attach it to the first tube of the cassette. Then break the seal on the tubing of this fluid bag. 6. Repeat  this process with all the bags you need to use. 7. Start the machine. This will prepare (prime) the machine and tubing by filling it with fluid and getting rid of all the air in the tubes. 8. Once all lines are prepared, the machine will tell you to connect yourself. 9. Remove the pull ring from the patient line with one hand. 10. Uncap your transfer set and attach it to the patient line. 11. Twist open the clamp on the transfer set. 12. Press "GO" on the machine. It will begin the draining and filling process. The machine may do 3 5 exchanges overnight, depending on what your doctor recommended. 13. In the morning, record all volumes and times shown on the machine. 14. Press "GO" on the machine. The machine will then tell you to close all clamps. 15. Close your transfer set twist clamp and then clamp all the tubes that go to the machine. 16. When told by the machine, disconnect the patient line from the transfer set. Re-cap your transfer set right away. 17. Tape the transfer set to your belly as told. 18. Check your blood pressure, temperature, and pulse. The fluid that is in your belly in the morning will stay there during the day. If the body cannot go all day without an exchange, you may need to perform an exchange during the day. Follow the steps under CAPD if an extra exchange is needed. HOME CARE  Follow diet instructions as told by your doctor.  Always keep the fluid bags and other supplies in a cool, clean, and dry place.  Keep a strict  schedule. Dialysis must be done every day. Do not skip a day or an exchange. Make sure to make time for each exchange.  Weigh yourself every day. Sudden weight gain may be a sign of a problem.  Take medicines as told by your doctor.  Avoid becoming constipated. Constipation prevents fluid from draining well. To prevent constipation:  Make sure you eat fiber-rich foods.  Avoid foods that cause constipation.  Increase your physical activity.  Go to the restroom when you feel you need to instead of holding it in.  Take drugs such as laxatives if told by your doctor. Only take them as told. GET HELP IF:  You have a fever or chills.  You feel sick to your stomach (nauseous) or throw up (vomit).  You have diarrhea.  You have any problems with an exchange.  Your blood pressure increases.  You suddenly gain weight or feel short of breath.  The tube in your belly seems loose or feels like it is coming out.  The fluid that has drained from your belly is pinkish or reddish. Women having their menstrual period do not need to get help if the fluid is only a little pink or red.  There are white strands in the fluid that are large enough to get stuck in your tubing. GET HELP RIGHT AWAY IF:  The area around the tube in your belly swells or becomes red, tender, or painful.  There is pus coming from the area around the tube in your belly.  The fluid that has drained from your belly is cloudy.  You have belly pain or discomfort. Document Released: 08/26/2010 Document Revised: 03/26/2013 Document Reviewed: 01/20/2013 [facility] Patient Information 2014 [place], [place]. Dialysis Dialysis is a procedure that replaces some of the work healthy kidneys do. It is done when you lose about 85 90% of your kidney function. It may also be  done earlier if your symptoms may be improved by dialysis. During dialysis, wastes, salt, and extra water are removed from the blood, and the levels of certain  chemicals in the blood (such as potassium) are maintained. Dialysis is done in sessions. Dialysis sessions are continued until the kidneys get better. If the kidneys cannot get better, such as in end-stage kidney disease, dialysis is continued for life or until you receive a new kidney (kidney transplant). There are two types of dialysis: hemodialysis and peritoneal dialysis. HEMODIALYSIS  Hemodialysis is a type of dialysis in which a machine called a dialyzer is used to filter the blood. Before beginning hemodialysis, you will have surgery to create a site where blood can be removed from the body and returned to the body (vascular access). There are three types of vascular accesses:  Arteriovenous fistula. To create this type of access, an artery is connected to a vein (usually in the arm). A fistula takes 1 6 months to develop after surgery. If it develops properly, it usually lasts longer than the other types of vascular accesses. It is also less likely to become infected and cause blood clots.  Arteriovenous graft. To create this type of access, an artery and a vein in the arm are connected with a tube. A graft may be used within 2 3 weeks of surgery.  A venous catheter. To create this type of access, a thin, flexible tube (catheter) is placed in a large vein in your neck, chest, or groin. A catheter may be used right away. It is usually used as a temporary access when dialysis needs to begin immediately. During hemodialysis, blood leaves the body through your access. It travels through a tube to the dialyzer, where it is filtered. The blood then returns to your body through another tube. Hemodialysis is usually performed by a caregiver at a hospital or dialysis center three times a week. Visits last about 3 4 hours. It may also be performed with the help of another person at home with training.  PERITONEAL DIALYSIS Peritoneal dialysis is a type of dialysis in which the thin lining of the abdomen  (peritoneum) is used as a filter. Before beginning peritoneal dialysis, you will have surgery to place a catheter in your abdomen. The catheter will be used to transfer a fluid called dialysate to and from your abdomen. At the start of a session, your abdomen is filled with dialysate. During the session, wastes, salt, and extra water in the blood pass through the peritoneum and into the dialysate. The dialysate is drained from the body at the end of the session. The process of filling and draining the dialysate is called an exchange. Exchanges are repeated until you have used up all the dialysate for the day. Peritoneal dialysis may be performed by you at home or at almost any other location. It is done every day. You may need up to five exchanges a day. The amount of time the dialysate is in your body between exchanges is called a dwell. The dwell depends on the number of exchanges needed and the characteristics of the peritoneum. It usually varies from 1.5 3 hours. You may go about your day normally between exchanges. Alternately, the exchanges may be done at night while you sleep using a machine called a cycler. CHOOSING HEMODIALYSIS OR PERITONEAL DIALYSIS  Both hemodialysis and peritoneal dialysis have advantages and disadvantages. Talk to your caregiver about which type of dialysis would be best for you. Your lifestyle and preferences  should be considered along with your medical condition. In some cases, only one type of dialysis may be an option.  Advantages of hemodialysis  It is done less often than peritoneal dialysis.  Someone else can do the dialysis for you.  If you go to a dialysis center, your caregiver will be able to recognize any problems right away.  If you go to a dialysis center, you can interact with others who are having dialysis. This can provide you with emotional support. Disadvantages of hemodialysis  Hemodialysis may cause cramps and low blood pressure. It may leave you  feeling tired on the days you have the treatment.  If you go to a dialysis center, you will need to make weekly appointments and work around the center's schedule.  You will need to take extra care when traveling. If you go to a dialysis center, you will need to make special arrangements to visit a dialysis center near your destination. If you are having treatments at home, you will need to take the dialyzer with you to your destination.  You will need to avoid more foods than you would need to avoid on peritoneal dialysis. Advantages of peritoneal dialysis  It is less likely than hemodialysis to cause cramps and low blood pressure.  You may do exchanges on your own wherever you are, including when you travel.  You do not need to avoid as many foods as you do on hemodialysis. Disadvantages of Peritoneal Dialysis  It is done more often than hemodialysis.  Performing peritoneal dialysis requires you to have dexterity of the hands. You must also be able to lift bags.  You will have to learn sterilization techniques. You will need to practice them every day to reduce the risk of infection. DIALYSIS DIET Both hemodialysis and peritoneal dialysis require you to make some changes to your diet. For example, you will need to limit your intake of foods high in the minerals phosphorus and potassium. You will also need to limit your fluid intake. Your dietitian can help you plan meals. A good meal plan can improve your dialysis and your health.  WHAT TO EXPECT  Adjusting to the dialysis treatment, schedule, and diet can take some time. You may need to stop working and may not be able to do some of the things you normally do. You may feel anxious or depressed when beginning dialysis. Eventually, many people feel better overall because of dialysis. Some people are able to return to work after making some changes, such as reducing work intensity. FOR MORE INFORMATION  National Kidney Foundation:  www.kidney.org American Association of Kidney Patients: BombTimer.gl  American Kidney Fund: www.kidneyfund.org Document Released: 10/14/2002 Document Revised: 03/26/2013 Document Reviewed: 09/17/2012 [facility] Patient Information 2014 [place]. Renal Diet, Pre-Dialysis Chronic kidney disease (CKD) is when the kidneys are not working the way they should. There are 5 stages of kidney disease. Your meal plan will depend on the stage you are diagnosed with. Food can make you healthier and keep your kidneys working well. Use this sheet to help you learn how to eat right to feel right.  A Registered Dietitian can help you with this meal plan. Write down your dietician's name and phone number. HOW DOES FOOD AFFECT MY KIDNEYS? Food gives you energy and helps your body repair itself. Food is broken down in your stomach and intestines. Your blood picks up nutrients from the digested food and carries them to all your body cells. These cells take nutrients from your blood  and put waste products back into the bloodstream. When your kidneys were healthy, they constantly removed wastes from your blood. The wastes left your body when you urinated or when you had bowel movements. Now that your kidneys cannot remove wastes from food, you will need to eat fewer foods that cause waste buildup in the body.  FLUIDS Most people with CKD do not need to restrict fluid. However, if you are retaining fluid in your legs, your caregiver may recommend restricting fluids. Ask and record the amount of fluid you can have each day if instructed by your caregiver. POTASSIUM  Potassium is a mineral found in many foods, especially milk, fruits, and vegetables. It affects how steadily your heart beats. Healthy kidneys keep the right amount of potassium in the blood to keep the heart beating at a steady pace. You may need to restrict potassium. Talk to your caregiver or dietitian to learn more about your potassium needs. If you do  need to reduce the potassium in your diet, start by noting the high-potassium foods (below) that you now eat. A dietitian can help you add other foods to the list and tell you how much of the foods below to eat.  High-Potassium Foods  Apricots.  Brussels sprouts.  Dates.  Lima beans.  Oranges.  Prune juice.  Spinach.  Avocados.  Milk.  Figs.  Melons.  Peanuts.  Prunes.  Tomatoes.  Bananas.  Cantaloupe.  Kiwi fruit.  Nectarines.  Asparagus spears.  Raisins.  Winter squash.  Beets.  Clams.  Fruit.  Orange juice.  Potatoes.  Sardines.  Yogurt. PHOSPHORUS  Phosphorus is a mineral found in many foods. If you have too much phosphorus in your blood, it pulls calcium from your bones. Losing calcium will make your bones weak and likely to break. Also, too much phosphorus may make your skin itch. Avoid foods like milk and cheese, dried beans, peas, colas, nuts, and peanut butter, which are high in phosphorus.  Your needs will depend on your kidney's ability to use phosphorous. Your dietitian can tell you how much of these foods to eat. PROTEIN  It is important to follow a low-protein diet. A lower protein diet will slow the rate of your kidney failure.  Protein helps you keep muscle and repair tissue. In your body, the protein you eat breaks down into a waste product called urea. If urea builds up in your blood, you can become very sick. Ask and record how many servings of meat, fish, chicken, or eggs you may eat per day. Your dietitian may give you a specific number of grams of protein to eat daily.One ounce of meat, fish, chicken, or 1 egg is equal to 7 g. A regular serving size is about the size of the palm of your hand or a deck of cards and is 3 oz in weight and 21 g of protein.  SODIUM  Sodium is found in salt and other foods. Most canned foods and frozen dinners contain large amounts of sodium.  Try to eat fresh foods that are naturally low in  sodium. Look for products labeled "low sodium."  Do not use salt substitutes because they contain potassium. Talk to a dietitian about spices you can use to flavor your food. The dietitian can help you find spice blends without sodium or potassium. VITAMINS AND MINERALS   Take only the vitamins that your caregiver prescribes.  Vitamins and minerals may be missing from your diet because you need to avoid so many  foods. Your caregiver may prescribe a vitamin and mineral supplement to meet your needs. Document Released: 10/14/2002 Document Revised: 10/16/2011 Document Reviewed: 10/21/2010 [facility] Patient Information 2014 [place], [place].

## 2013-10-02 ENCOUNTER — Ambulatory Visit: Payer: Medicare HMO | Admitting: Cardiovascular Disease

## 2013-10-07 ENCOUNTER — Other Ambulatory Visit: Payer: Self-pay | Admitting: Cardiovascular Disease

## 2013-10-07 NOTE — Telephone Encounter (Signed)
Rx was sent to pharmacy electronically. 

## 2013-10-08 ENCOUNTER — Ambulatory Visit (INDEPENDENT_AMBULATORY_CARE_PROVIDER_SITE_OTHER): Payer: Medicare HMO | Admitting: *Deleted

## 2013-10-08 DIAGNOSIS — Z5181 Encounter for therapeutic drug level monitoring: Secondary | ICD-10-CM

## 2013-10-08 DIAGNOSIS — I4891 Unspecified atrial fibrillation: Secondary | ICD-10-CM

## 2013-10-08 DIAGNOSIS — Z7901 Long term (current) use of anticoagulants: Secondary | ICD-10-CM

## 2013-10-08 LAB — POCT INR: INR: 3.2

## 2013-10-16 ENCOUNTER — Other Ambulatory Visit: Payer: Self-pay | Admitting: *Deleted

## 2013-10-16 DIAGNOSIS — Z01810 Encounter for preprocedural cardiovascular examination: Secondary | ICD-10-CM

## 2013-10-16 DIAGNOSIS — N185 Chronic kidney disease, stage 5: Secondary | ICD-10-CM

## 2013-10-21 ENCOUNTER — Encounter: Payer: Self-pay | Admitting: Cardiovascular Disease

## 2013-10-21 ENCOUNTER — Ambulatory Visit (INDEPENDENT_AMBULATORY_CARE_PROVIDER_SITE_OTHER): Payer: Medicare HMO | Admitting: Cardiovascular Disease

## 2013-10-21 VITALS — BP 122/88 | HR 88 | Resp 16 | Ht 63.0 in | Wt 220.6 lb

## 2013-10-21 DIAGNOSIS — I10 Essential (primary) hypertension: Secondary | ICD-10-CM

## 2013-10-21 DIAGNOSIS — N185 Chronic kidney disease, stage 5: Secondary | ICD-10-CM

## 2013-10-21 DIAGNOSIS — E782 Mixed hyperlipidemia: Secondary | ICD-10-CM

## 2013-10-21 DIAGNOSIS — I4891 Unspecified atrial fibrillation: Secondary | ICD-10-CM

## 2013-10-21 MED ORDER — PRAVASTATIN SODIUM 40 MG PO TABS: 40.0000 mg | ORAL_TABLET | Freq: Every day | ORAL | Status: DC

## 2013-10-21 NOTE — Assessment & Plan Note (Signed)
Avoid ACE inhibitors, angiotensin receptor blockers and other agents that may worsen renal function. Blood pressure control is good.

## 2013-10-21 NOTE — Assessment & Plan Note (Signed)
I am concerned about the potential adverse interaction between the diltiazem and simvastatin especially since she is also taking fenofibrate. Have recommended that we switch to pravastatin 40 mg at bedtime daily. We'll get her repeat lipid profile in about 3 months.

## 2013-10-21 NOTE — Patient Instructions (Signed)
Your physician recommends that you schedule a follow-up appointment in: 12 months.  

## 2013-10-21 NOTE — Assessment & Plan Note (Signed)
Rate control is appropriate as is her anticoagulation regimen. Asymptomatic. No history of embolic events. When she needs placement of her AV fistula I would not recommend "bridging" with enoxaparin. Would try to limit the total amount of time that she is off anticoagulation to the minimum. This surgical procedure would be associated with a low risk of cardiovascular complications.

## 2013-10-21 NOTE — Progress Notes (Signed)
Patient ID: Sabrina Beck, female   DOB: 1941/08/04, 73 y.o.   MRN: [id number]      Reason for office visit atrial fibrillation   Sabrina Beck is a 73 year old woman with a long-standing history of permanent atrial fibrillation systemic hypertension and hyperlipidemia but without coronary disease or major structural heart problems. She did not have evidence of left ventricular systolic or diastolic dysfunction by echo in 2012, although the left atrium was described as being mildly to moderately dilated. There no serious valvular abnormalities. She had a normal nuclear stress test in 2007 and has never had complaints of angina. She has been taking chronic warfarin anticoagulation for several years, without a history of bleeding complications and without a history of stroke, TIA or other embolic events.  She also has severe chronic kidney disease, now stage V with an estimated creatinine clearance of around 10 mL per minute and a serum creatinine that is usually around 4.0 mg/dL. She is preparing to have surgical implantation of arteriovenous fistula in the near future. She has thought about peritoneal dialysis but decided on hemodialysis in the end. She is ordered receiving erythropoietin injections periodically.  She is asymptomatic from a cardiac standpoint. She has never been really aware of her arrhythmia.  No Known Allergies  Current Outpatient Prescriptions  Medication Sig Dispense Refill  . calcitRIOL (ROCALTROL) 0.25 MCG capsule Take 0.25 mcg by mouth daily.      Marland Kitchen diltiazem (CARDIZEM CD) 120 MG 24 hr capsule TAKE ONE (1) CAPSULE EACH DAY  30 capsule  5  . Fe Fum-FA-B Cmp-C-Zn-Mg-Mn-Cu (FERROCITE PLUS PO) Take by mouth once.      . fenofibrate (TRICOR) 145 MG tablet Take 1 tablet (145 mg total) by mouth daily.  30 tablet  5  . metoprolol tartrate (LOPRESSOR) 25 MG tablet TAKE ONE AND ONE-HALF TABLETS TWO TIMES A DAY  90 tablet  0  . Multiple Vitamins-Minerals (VISION-VITE PRESERVE PO)  Take by mouth every morning.      . Omega-3 Fatty Acids (FISH OIL) 1200 MG CAPS Take 1 capsule by mouth 2 (two) times daily.      Marland Kitchen omeprazole (PRILOSEC) 20 MG capsule Take 20 mg by mouth daily.      . sevelamer (RENVELA) 800 MG tablet Take 800 mg by mouth 3 (three) times daily with meals. And patient also takes wit snacks      . sodium bicarbonate 650 MG tablet Take 650 mg by mouth 3 (three) times daily.      . Vitamin D, Ergocalciferol, (DRISDOL) 50000 UNITS CAPS Take 50,000 Units by mouth every 7 (seven) days.      Marland Kitchen warfarin (COUMADIN) 5 MG tablet Take 1 tablet by mouth daily or as directed  30 tablet  4  . pravastatin (PRAVACHOL) 40 MG tablet Take 1 tablet (40 mg total) by mouth at bedtime.  90 tablet  3   No current facility-administered medications for this visit.    Past Medical History  Diagnosis Date  . Anemia 03/27/2011  . Renal disorder   . Atrial fibrillation   . Murmur 06/04/2006,09/29/10    2D Echo perform both test shows EF 55%  . Chest pain 05/15/2006    stress test perform negative for ischemia  . Systemic hypertension   . Mixed hyperlipidemia   . Kidney disease     stage 4 chronic    Past Surgical History  Procedure Laterality Date  . Cholecystectomy    . Abdominal hysterectomy    .  Breast surgery      No family history on file.  History   Social History  . Marital Status: Widowed    Spouse Name: N/A    Number of Children: N/A  . Years of Education: N/A   Occupational History  . Not on file.   Social History Main Topics  . Smoking status: Former Smoker    Quit date: 08/08/1999  . Smokeless tobacco: Not on file  . Alcohol Use: No  . Drug Use: No  . Sexual Activity: Not on file   Other Topics Concern  . Not on file   Social History Narrative  . No narrative on file    Review of systems: The patient specifically denies any chest pain at rest or with exertion, dyspnea at rest or with exertion, orthopnea, paroxysmal nocturnal dyspnea,  syncope, palpitations, focal neurological deficits, intermittent claudication, lower extremity edema, unexplained weight gain, cough, hemoptysis or wheezing.  The patient also denies abdominal pain, nausea, vomiting, dysphagia, diarrhea, constipation, polyuria, polydipsia, dysuria, hematuria, frequency, urgency, abnormal bleeding or bruising, fever, chills, unexpected weight changes, mood swings, change in skin or hair texture, change in voice quality, auditory or visual problems, allergic reactions or rashes, new musculoskeletal complaints other than usual "aches and pains".   PHYSICAL EXAM BP 122/88  Pulse 88  Resp 16  Ht 5\' 3"  (1.6 m)  Wt 100.064 kg (220 lb 9.6 oz)  BMI 39.09 kg/m2  General: Alert, oriented x3, no distress Head: no evidence of trauma, PERRL, EOMI, no exophtalmos or lid lag, no myxedema, no xanthelasma; normal ears, nose and oropharynx Neck: normal jugular venous pulsations and no hepatojugular reflux; brisk carotid pulses without delay and no carotid bruits Chest: clear to auscultation, no signs of consolidation by percussion or palpation, normal fremitus, symmetrical and full respiratory excursions Cardiovascular: normal position and quality of the apical impulse, irregular rhythm, normal first and second heart sounds, no murmurs, rubs or gallops Abdomen: no tenderness or distention, no masses by palpation, no abnormal pulsatility or arterial bruits, normal bowel sounds, no hepatosplenomegaly Extremities: no clubbing, cyanosis or edema; 2+ radial, ulnar and brachial pulses bilaterally; 2+ right femoral, posterior tibial and dorsalis pedis pulses; 2+ left femoral, posterior tibial and dorsalis pedis pulses; no subclavian or femoral bruits Neurological: grossly nonfocal   EKG: Atrial fibrillation, QS in V1-V2, otherwise normal  The most recent lipid profile available shows a total cholesterol 163, HDL of 18, LDL 79, triglycerides 330  BMET    Component Value Date/Time     NA 141 09/26/2013 0900   K 3.9 09/26/2013 0900   CL 107 09/26/2013 0900   CO2 23 09/26/2013 0900   GLUCOSE 105* 09/26/2013 0900   BUN 40* 09/26/2013 0900   CREATININE 4.02* 09/26/2013 0900   CALCIUM 8.9 09/26/2013 0900   CALCIUM 8.1* 01/11/2012 1039   GFRNONAA 10* 09/26/2013 0900   GFRAA 12* 09/26/2013 0900     ASSESSMENT AND PLAN HTN (hypertension) Avoid ACE inhibitors, angiotensin receptor blockers and other agents that may worsen renal function. Blood pressure control is good.  Atrial fibrillation Rate control is appropriate as is her anticoagulation regimen. Asymptomatic. No history of embolic events. When she needs placement of her AV fistula I would not recommend "bridging" with enoxaparin. Would try to limit the total amount of time that she is off anticoagulation to the minimum. This surgical procedure would be associated with a low risk of cardiovascular complications.  CKD (chronic kidney disease) stage 5, GFR less than 15 ml/min  Mixed hyperlipidemia I am concerned about the potential adverse interaction between the diltiazem and simvastatin especially since she is also taking fenofibrate. Have recommended that we switch to pravastatin 40 mg at bedtime daily. We'll get her repeat lipid profile in about 3 months.   Orders Placed This Encounter  Procedures  . EKG 12-Lead   Meds ordered this encounter  Medications  . pravastatin (PRAVACHOL) 40 MG tablet    Sig: Take 1 tablet (40 mg total) by mouth at bedtime.    Dispense:  90 tablet    Refill:  [place] TRUE Shackleford, MD, [facility] [phone] office [phone] pager

## 2013-10-24 ENCOUNTER — Encounter (HOSPITAL_COMMUNITY)
Admission: RE | Admit: 2013-10-24 | Discharge: 2013-10-24 | Disposition: A | Discharge status: Home / Self Care | Payer: Medicare HMO | Source: Ambulatory Visit | Attending: Nephrology | Admitting: Nephrology

## 2013-10-24 DIAGNOSIS — N185 Chronic kidney disease, stage 5: Secondary | ICD-10-CM | POA: Insufficient documentation

## 2013-10-24 DIAGNOSIS — D631 Anemia in chronic kidney disease: Secondary | ICD-10-CM | POA: Insufficient documentation

## 2013-10-24 DIAGNOSIS — N039 Chronic nephritic syndrome with unspecified morphologic changes: Secondary | ICD-10-CM

## 2013-10-24 LAB — RENAL FUNCTION PANEL
Albumin: 3.3 g/dL — ABNORMAL LOW (ref 3.5–5.2)
BUN: 55 mg/dL — ABNORMAL HIGH (ref 6–23)
CHLORIDE: 104 meq/L (ref 96–112)
CO2: 22 mEq/L (ref 19–32)
Calcium: 8.6 mg/dL (ref 8.4–10.5)
Creatinine, Ser: 4.6 mg/dL — ABNORMAL HIGH (ref 0.50–1.10)
GFR calc Af Amer: 10 mL/min — ABNORMAL LOW (ref >90)
GFR calc non Af Amer: 9 mL/min — ABNORMAL LOW (ref >90)
GLUCOSE: 133 mg/dL — AB (ref 70–99)
POTASSIUM: 3.7 meq/L (ref 3.7–5.3)
Phosphorus: 5.1 mg/dL — ABNORMAL HIGH (ref 2.3–4.6)
Sodium: 141 mEq/L (ref 137–147)

## 2013-10-24 LAB — HEMOGLOBIN AND HEMATOCRIT, BLOOD
HCT: 26.2 % — ABNORMAL LOW (ref 36.0–46.0)
Hemoglobin: 8.5 g/dL — ABNORMAL LOW (ref 12.0–15.0)

## 2013-10-24 MED ORDER — EPOETIN ALFA 10000 UNIT/ML IJ SOLN
10000.0000 [IU] | INTRAMUSCULAR | Status: DC
  Administered 2013-10-24: 12000 [IU] via SUBCUTANEOUS
  Filled 2013-10-24 (×2): qty 1

## 2013-10-24 NOTE — Progress Notes (Signed)
Results for JAIONA, KENTNER (MRN [id number]) as of 10/24/2013 09:28  Ref. Range 10/24/2013 09:10  Sodium Latest Range: 137-147 mEq/L 141  Potassium Latest Range: 3.7-5.3 mEq/L 3.7  Chloride Latest Range: 96-112 mEq/L 104  CO2 Latest Range: 19-32 mEq/L 22  BUN Latest Range: 6-23 mg/dL 55 (H)  Creatinine Latest Range: 0.50-1.10 mg/dL 4.60 (H)  Calcium Latest Range: 8.4-10.5 mg/dL 8.6  GFR calc non Af Amer Latest Range: >90 mL/min 9 (L)  GFR calc Af Amer Latest Range: >90 mL/min 10 (L)  Glucose Latest Range: 70-99 mg/dL 133 (H)  Phosphorus Latest Range: 2.3-4.6 mg/dL 5.1 (H)  Albumin Latest Range: 3.5-5.2 g/dL 3.3 (L)  Results for CHARLA, FURY (MRN [id number]) as of 10/24/2013 09:28  Ref. Range 10/24/2013 09:10  Hemoglobin Latest Range: 12.0-15.0 g/dL 8.5 (L)  HCT Latest Range: 36.0-46.0 % 26.2 (L)  Procrit 12,000 units given at this visit. Tolerated well.

## 2013-11-04 ENCOUNTER — Encounter (INDEPENDENT_AMBULATORY_CARE_PROVIDER_SITE_OTHER): Payer: Self-pay

## 2013-11-04 ENCOUNTER — Encounter (INDEPENDENT_AMBULATORY_CARE_PROVIDER_SITE_OTHER): Payer: Self-pay | Admitting: *Deleted

## 2013-11-05 ENCOUNTER — Encounter: Payer: Self-pay | Admitting: Vascular Surgery

## 2013-11-06 ENCOUNTER — Encounter: Payer: Self-pay | Admitting: Vascular Surgery

## 2013-11-06 ENCOUNTER — Ambulatory Visit (INDEPENDENT_AMBULATORY_CARE_PROVIDER_SITE_OTHER): Payer: Medicare HMO | Admitting: Vascular Surgery

## 2013-11-06 ENCOUNTER — Ambulatory Visit (HOSPITAL_COMMUNITY)
Admission: RE | Admit: 2013-11-06 | Discharge: 2013-11-06 | Disposition: A | Discharge status: Home / Self Care | Payer: Medicare HMO | Source: Ambulatory Visit | Attending: Vascular Surgery | Admitting: Vascular Surgery

## 2013-11-06 ENCOUNTER — Other Ambulatory Visit: Payer: Self-pay | Admitting: *Deleted

## 2013-11-06 ENCOUNTER — Ambulatory Visit (INDEPENDENT_AMBULATORY_CARE_PROVIDER_SITE_OTHER)
Admission: RE | Admit: 2013-11-06 | Discharge: 2013-11-06 | Disposition: A | Discharge status: Home / Self Care | Payer: Medicare HMO | Source: Ambulatory Visit | Attending: Vascular Surgery | Admitting: Vascular Surgery

## 2013-11-06 VITALS — BP 137/82 | HR 85 | Ht 63.0 in | Wt 222.8 lb

## 2013-11-06 DIAGNOSIS — N185 Chronic kidney disease, stage 5: Secondary | ICD-10-CM | POA: Insufficient documentation

## 2013-11-06 DIAGNOSIS — Z01810 Encounter for preprocedural cardiovascular examination: Secondary | ICD-10-CM

## 2013-11-06 DIAGNOSIS — Z4931 Encounter for adequacy testing for hemodialysis: Secondary | ICD-10-CM | POA: Insufficient documentation

## 2013-11-06 DIAGNOSIS — N186 End stage renal disease: Secondary | ICD-10-CM

## 2013-11-06 NOTE — Progress Notes (Signed)
[facility] [place] HISTORY AND PHYSICAL   History of Present Illness:  Patient is a 73 y.o. year old female who presents for placement of a permanent hemodialysis access. The patient is referred by Dr. Hinda Lenis. The patient is right handed .  The patient is not currently on hemodialysis.   Other chronic medical problems include anemia, atrial fibrillation on Coumadin, hypertension, stage IV renal failure all of which are currently stable.  Past Medical History  Diagnosis Date  . Anemia 03/27/2011  . Renal disorder   . Atrial fibrillation   . Murmur 06/04/2006,09/29/10    2D Echo perform both test shows EF 55%  . Chest pain 05/15/2006    stress test perform negative for ischemia  . Systemic hypertension   . Mixed hyperlipidemia   . Kidney disease     stage 4 chronic  . Cancer     uterine    Past Surgical History  Procedure Laterality Date  . Cholecystectomy    . Abdominal hysterectomy    . Breast surgery       Social History History  Substance Use Topics  . Smoking status: Former Smoker    Quit date: 08/08/1999  . Smokeless tobacco: Not on file  . Alcohol Use: No    Family History Family History  Problem Relation Age of Onset  . Diabetes Mother   . Hypertension Mother   . Diabetes Father   . Heart disease Father     before age 8  . Heart attack Father   . Diabetes Sister   . Hyperlipidemia Sister   . Hypertension Sister   . Cancer Brother   . Diabetes Brother   . Heart disease Brother   . Hypertension Brother   . Heart attack Brother     Allergies  No Known Allergies   Current Outpatient Prescriptions  Medication Sig Dispense Refill  . calcitRIOL (ROCALTROL) 0.25 MCG capsule Take 0.25 mcg by mouth daily.      Marland Kitchen diltiazem (CARDIZEM CD) 120 MG 24 hr capsule TAKE ONE (1) CAPSULE EACH DAY  30 capsule  5  . Fe Fum-FA-B Cmp-C-Zn-Mg-Mn-Cu (FERROCITE PLUS PO) Take by mouth once.      . fenofibrate (TRICOR) 145 MG tablet Take 1 tablet  (145 mg total) by mouth daily.  30 tablet  5  . metoprolol tartrate (LOPRESSOR) 25 MG tablet TAKE ONE AND ONE-HALF TABLETS TWO TIMES A DAY  90 tablet  0  . Multiple Vitamins-Minerals (VISION-VITE PRESERVE PO) Take by mouth every morning.      . Omega-3 Fatty Acids (FISH OIL) 1200 MG CAPS Take 1 capsule by mouth 2 (two) times daily.      Marland Kitchen omeprazole (PRILOSEC) 20 MG capsule Take 20 mg by mouth daily.      . pravastatin (PRAVACHOL) 40 MG tablet Take 1 tablet (40 mg total) by mouth at bedtime.  90 tablet  3  . sevelamer (RENVELA) 800 MG tablet Take 800 mg by mouth 3 (three) times daily with meals. And patient also takes wit snacks      . sodium bicarbonate 650 MG tablet Take 650 mg by mouth 3 (three) times daily.      . Vitamin D, Ergocalciferol, (DRISDOL) 50000 UNITS CAPS Take 50,000 Units by mouth every 7 (seven) days.      Marland Kitchen warfarin (COUMADIN) 5 MG tablet Take 1 tablet by mouth daily or as directed  30 tablet  4   No current facility-administered medications for this visit.  ROS:   General:  No weight loss, Fever, chills  HEENT: No recent headaches, no nasal bleeding, no visual changes, no sore throat  Neurologic: No dizziness, blackouts, seizures. No recent symptoms of stroke or mini- stroke. No recent episodes of slurred speech, or temporary blindness.  Cardiac: No recent episodes of chest pain/pressure, no shortness of breath at rest.  + shortness of breath with exertion.  Denies history of atrial fibrillation or irregular heartbeat  Vascular: No history of rest pain in feet.  No history of claudication.  No history of non-healing ulcer, No history of DVT   Pulmonary: No home oxygen, no productive cough, no hemoptysis,  No asthma or wheezing  Musculoskeletal:  [ ]  Arthritis, [ ]  Low back pain,  [ ]  Joint pain  Hematologic:No history of hypercoagulable state.  No history of easy bleeding.  No history of anemia  Gastrointestinal: No hematochezia or melena,  No gastroesophageal  reflux, no trouble swallowing  Urinary: [ ]  chronic Kidney disease, [ ]  on HD - [ ]  MWF or [ ]  TTHS, [ ]  Burning with urination, [ ]  Frequent urination, [ ]  Difficulty urinating;   Skin: No rashes  Psychological: No history of anxiety,  No history of depression   Physical Examination  Filed Vitals:   11/06/13 1031  BP: 137/82  Pulse: 85  Height: 5\' 3"  (1.6 m)  Weight: 222 lb 12.8 oz (101.061 kg)  SpO2: 98%    Body mass index is 39.48 kg/(m^2).  General:  Alert and oriented, no acute distress HEENT: Normal Neck: No bruit or JVD Pulmonary: Clear to auscultation bilaterally Cardiac: Regular Rate and Rhythm without murmur Gastrointestinal: Soft, non-tender, non-distended, no mass Skin: No rash Extremity Pulses:  2+ radial, brachial pulses bilaterally Musculoskeletal: No deformity or edema  Neurologic: Upper and lower extremity motor 5/5 and symmetric  DATA: Vein mapping ultrasound was performed today which shows the cephalic vein is greater than 3 mm in both upper extremities.   ASSESSMENT: Patient needs hemodialysis access. Should she is right-handed we will try to go to the left arm first.   PLAN:  Risks benefits possible complications and procedure details of left arm AV fistula were explained the patient today. These include but are not limited to bleeding infection non-maturation of fistula ischemic steal. She understands and agrees to proceed. We will stop her Coumadin on April 9. She will have a left radiocephalic fistula April 13.  Ruta Hinds, MD [facility] [place] Office: [phone] Pager: [phone]

## 2013-11-07 ENCOUNTER — Encounter (HOSPITAL_COMMUNITY): Payer: Self-pay | Admitting: Pharmacy Technician

## 2013-11-11 ENCOUNTER — Other Ambulatory Visit: Payer: Self-pay | Admitting: Cardiovascular Disease

## 2013-11-12 ENCOUNTER — Ambulatory Visit (INDEPENDENT_AMBULATORY_CARE_PROVIDER_SITE_OTHER): Payer: Medicare HMO | Admitting: *Deleted

## 2013-11-12 DIAGNOSIS — Z5181 Encounter for therapeutic drug level monitoring: Secondary | ICD-10-CM

## 2013-11-12 DIAGNOSIS — I4891 Unspecified atrial fibrillation: Secondary | ICD-10-CM

## 2013-11-12 DIAGNOSIS — Z7901 Long term (current) use of anticoagulants: Secondary | ICD-10-CM

## 2013-11-12 LAB — POCT INR: INR: 2.7

## 2013-11-13 ENCOUNTER — Encounter (HOSPITAL_COMMUNITY): Payer: Self-pay | Admitting: *Deleted

## 2013-11-13 ENCOUNTER — Encounter (HOSPITAL_COMMUNITY): Payer: Medicare HMO

## 2013-11-13 ENCOUNTER — Encounter (HOSPITAL_COMMUNITY)
Admission: RE | Admit: 2013-11-13 | Discharge: 2013-11-13 | Disposition: A | Discharge status: Home / Self Care | Payer: Medicare HMO | Source: Ambulatory Visit | Attending: Nephrology | Admitting: Nephrology

## 2013-11-13 DIAGNOSIS — N185 Chronic kidney disease, stage 5: Secondary | ICD-10-CM | POA: Insufficient documentation

## 2013-11-13 DIAGNOSIS — D631 Anemia in chronic kidney disease: Secondary | ICD-10-CM | POA: Insufficient documentation

## 2013-11-13 DIAGNOSIS — N039 Chronic nephritic syndrome with unspecified morphologic changes: Secondary | ICD-10-CM

## 2013-11-13 LAB — RENAL FUNCTION PANEL
Albumin: 3.5 g/dL (ref 3.5–5.2)
BUN: 49 mg/dL — ABNORMAL HIGH (ref 6–23)
CO2: 22 meq/L (ref 19–32)
Calcium: 8.1 mg/dL — ABNORMAL LOW (ref 8.4–10.5)
Chloride: 106 mEq/L (ref 96–112)
Creatinine, Ser: 4.69 mg/dL — ABNORMAL HIGH (ref 0.50–1.10)
GFR, EST AFRICAN AMERICAN: 10 mL/min — AB (ref >90)
GFR, EST NON AFRICAN AMERICAN: 8 mL/min — AB (ref >90)
Glucose, Bld: 124 mg/dL — ABNORMAL HIGH (ref 70–99)
Phosphorus: 5.9 mg/dL — ABNORMAL HIGH (ref 2.3–4.6)
Potassium: 3.6 mEq/L — ABNORMAL LOW (ref 3.7–5.3)
SODIUM: 144 meq/L (ref 137–147)

## 2013-11-13 LAB — HEMOGLOBIN AND HEMATOCRIT, BLOOD
HEMATOCRIT: 25.8 % — AB (ref 36.0–46.0)
Hemoglobin: 8.5 g/dL — ABNORMAL LOW (ref 12.0–15.0)

## 2013-11-13 MED ORDER — EPOETIN ALFA 10000 UNIT/ML IJ SOLN
INTRAMUSCULAR | Status: AC
  Filled 2013-11-13: qty 1

## 2013-11-13 MED ORDER — EPOETIN ALFA 10000 UNIT/ML IJ SOLN
12000.0000 [IU] | INTRAMUSCULAR | Status: DC
  Administered 2013-11-13: 12000 [IU] via SUBCUTANEOUS

## 2013-11-13 NOTE — Progress Notes (Signed)
11/13/13 Juncal  Have you ever been diagnosed with sleep apnea through a sleep study? No  Do you often feel tired, fatigued, or sleepy during the daytime? 1  Has anyone observed you stop breathing during your sleep? 0  Do you have, or are you being treated for high blood pressure? 1  BMI more than 35 kg/m2? 1  Age over 75 years old? 1  Gender: 0  Obstructive Sleep Apnea Score 4  Score 4 or greater  Results sent to PCP

## 2013-11-14 ENCOUNTER — Encounter (HOSPITAL_COMMUNITY): Admission: RE | Admit: 2013-11-14 | Payer: Medicare HMO | Source: Ambulatory Visit

## 2013-11-14 ENCOUNTER — Encounter (HOSPITAL_COMMUNITY): Payer: Medicare HMO

## 2013-11-16 MED ORDER — CEFUROXIME SODIUM 1.5 G IJ SOLR
1.5000 g | INTRAMUSCULAR | Status: AC
  Administered 2013-11-17: 1.5 g via INTRAVENOUS
  Filled 2013-11-16: qty 1.5

## 2013-11-17 ENCOUNTER — Encounter (HOSPITAL_COMMUNITY): Payer: Self-pay | Admitting: *Deleted

## 2013-11-17 ENCOUNTER — Telehealth: Payer: Self-pay | Admitting: Vascular Surgery

## 2013-11-17 ENCOUNTER — Other Ambulatory Visit: Payer: Medicare HMO | Admitting: *Deleted

## 2013-11-17 ENCOUNTER — Ambulatory Visit (HOSPITAL_COMMUNITY): Payer: Medicare HMO

## 2013-11-17 ENCOUNTER — Encounter (HOSPITAL_COMMUNITY)
Admission: RE | Disposition: A | Discharge status: Home / Self Care | Payer: Self-pay | Source: Ambulatory Visit | Attending: Vascular Surgery

## 2013-11-17 ENCOUNTER — Ambulatory Visit (HOSPITAL_COMMUNITY): Payer: Medicare HMO | Admitting: Anesthesiology

## 2013-11-17 ENCOUNTER — Encounter (HOSPITAL_COMMUNITY): Payer: Medicare HMO | Admitting: Anesthesiology

## 2013-11-17 ENCOUNTER — Ambulatory Visit (HOSPITAL_COMMUNITY)
Admission: RE | Admit: 2013-11-17 | Discharge: 2013-11-17 | Disposition: A | Discharge status: Home / Self Care | Payer: Medicare HMO | Source: Ambulatory Visit | Attending: Vascular Surgery | Admitting: Vascular Surgery

## 2013-11-17 DIAGNOSIS — I4891 Unspecified atrial fibrillation: Secondary | ICD-10-CM | POA: Insufficient documentation

## 2013-11-17 DIAGNOSIS — Z8542 Personal history of malignant neoplasm of other parts of uterus: Secondary | ICD-10-CM | POA: Insufficient documentation

## 2013-11-17 DIAGNOSIS — N186 End stage renal disease: Secondary | ICD-10-CM

## 2013-11-17 DIAGNOSIS — K219 Gastro-esophageal reflux disease without esophagitis: Secondary | ICD-10-CM | POA: Insufficient documentation

## 2013-11-17 DIAGNOSIS — N184 Chronic kidney disease, stage 4 (severe): Secondary | ICD-10-CM

## 2013-11-17 DIAGNOSIS — Z87891 Personal history of nicotine dependence: Secondary | ICD-10-CM | POA: Insufficient documentation

## 2013-11-17 DIAGNOSIS — D649 Anemia, unspecified: Secondary | ICD-10-CM | POA: Insufficient documentation

## 2013-11-17 DIAGNOSIS — Z7901 Long term (current) use of anticoagulants: Secondary | ICD-10-CM | POA: Insufficient documentation

## 2013-11-17 DIAGNOSIS — R011 Cardiac murmur, unspecified: Secondary | ICD-10-CM | POA: Insufficient documentation

## 2013-11-17 DIAGNOSIS — E782 Mixed hyperlipidemia: Secondary | ICD-10-CM | POA: Insufficient documentation

## 2013-11-17 DIAGNOSIS — I129 Hypertensive chronic kidney disease with stage 1 through stage 4 chronic kidney disease, or unspecified chronic kidney disease: Secondary | ICD-10-CM | POA: Insufficient documentation

## 2013-11-17 HISTORY — DX: Gastro-esophageal reflux disease without esophagitis: K21.9

## 2013-11-17 HISTORY — DX: Other specified postprocedural states: Z98.890

## 2013-11-17 HISTORY — PX: AV FISTULA PLACEMENT: SHX1204

## 2013-11-17 HISTORY — DX: Nausea with vomiting, unspecified: R11.2

## 2013-11-17 LAB — PROTIME-INR
INR: 1.36 (ref 0.00–1.49)
Prothrombin Time: 16.4 seconds — ABNORMAL HIGH (ref 11.6–15.2)

## 2013-11-17 LAB — APTT: APTT: 32 s (ref 24–37)

## 2013-11-17 LAB — POCT I-STAT 4, (NA,K, GLUC, HGB,HCT)
Glucose, Bld: 122 mg/dL — ABNORMAL HIGH (ref 70–99)
HCT: 28 % — ABNORMAL LOW (ref 36.0–46.0)
HEMOGLOBIN: 9.5 g/dL — AB (ref 12.0–15.0)
Potassium: 3.4 mEq/L — ABNORMAL LOW (ref 3.7–5.3)
SODIUM: 144 meq/L (ref 137–147)

## 2013-11-17 SURGERY — ARTERIOVENOUS (AV) FISTULA CREATION
Anesthesia: Monitor Anesthesia Care | Site: Arm Lower | Laterality: Left

## 2013-11-17 MED ORDER — PROTAMINE SULFATE 10 MG/ML IV SOLN
INTRAVENOUS | Status: AC
  Filled 2013-11-17: qty 5

## 2013-11-17 MED ORDER — MIDAZOLAM HCL 2 MG/2ML IJ SOLN
INTRAMUSCULAR | Status: AC
  Filled 2013-11-17: qty 2

## 2013-11-17 MED ORDER — LIDOCAINE HCL (PF) 1 % IJ SOLN
INTRAMUSCULAR | Status: DC | PRN
  Administered 2013-11-17: 20 mL via INTRADERMAL

## 2013-11-17 MED ORDER — PROPOFOL INFUSION 10 MG/ML OPTIME
INTRAVENOUS | Status: DC | PRN
  Administered 2013-11-17: 25 ug/kg/min via INTRAVENOUS

## 2013-11-17 MED ORDER — SODIUM CHLORIDE 0.9 % IR SOLN
Status: DC | PRN
  Administered 2013-11-17: 07:00:00

## 2013-11-17 MED ORDER — FENTANYL CITRATE 0.05 MG/ML IJ SOLN: 25.0000 ug | INTRAMUSCULAR | Status: DC | PRN

## 2013-11-17 MED ORDER — 0.9 % SODIUM CHLORIDE (POUR BTL) OPTIME
TOPICAL | Status: DC | PRN
  Administered 2013-11-17: 1000 mL

## 2013-11-17 MED ORDER — DEXTROSE 5 % IV SOLN
INTRAVENOUS | Status: DC | PRN
  Administered 2013-11-17: 08:00:00 via INTRAVENOUS

## 2013-11-17 MED ORDER — LIDOCAINE HCL (PF) 1 % IJ SOLN
INTRAMUSCULAR | Status: AC
  Filled 2013-11-17: qty 30

## 2013-11-17 MED ORDER — ONDANSETRON HCL 4 MG/2ML IJ SOLN
INTRAMUSCULAR | Status: DC | PRN
  Administered 2013-11-17: 4 mg via INTRAVENOUS

## 2013-11-17 MED ORDER — OXYCODONE HCL 5 MG PO TABS
5.0000 mg | ORAL_TABLET | Freq: Four times a day (QID) | ORAL | Status: DC | PRN
Start: 2013-11-17 — End: 2013-12-21

## 2013-11-17 MED ORDER — PROPOFOL 10 MG/ML IV BOLUS
INTRAVENOUS | Status: AC
  Filled 2013-11-17: qty 20

## 2013-11-17 MED ORDER — HEPARIN SODIUM (PORCINE) 1000 UNIT/ML IJ SOLN
INTRAMUSCULAR | Status: DC | PRN
  Administered 2013-11-17: 5000 [IU] via INTRAVENOUS

## 2013-11-17 MED ORDER — SODIUM CHLORIDE 0.9 % IV SOLN: INTRAVENOUS | Status: DC

## 2013-11-17 MED ORDER — PROTAMINE SULFATE 10 MG/ML IV SOLN
INTRAVENOUS | Status: DC | PRN
  Administered 2013-11-17 (×5): 10 mg via INTRAVENOUS

## 2013-11-17 MED ORDER — FENTANYL CITRATE 0.05 MG/ML IJ SOLN
INTRAMUSCULAR | Status: DC | PRN
  Administered 2013-11-17: 50 ug via INTRAVENOUS
  Administered 2013-11-17 (×2): 25 ug via INTRAVENOUS

## 2013-11-17 MED ORDER — FENTANYL CITRATE 0.05 MG/ML IJ SOLN
INTRAMUSCULAR | Status: AC
  Filled 2013-11-17: qty 5

## 2013-11-17 MED ORDER — SODIUM CHLORIDE 0.9 % IV SOLN
INTRAVENOUS | Status: DC | PRN
  Administered 2013-11-17: 07:00:00 via INTRAVENOUS

## 2013-11-17 MED ORDER — ONDANSETRON HCL 4 MG/2ML IJ SOLN
INTRAMUSCULAR | Status: AC
  Filled 2013-11-17: qty 2

## 2013-11-17 MED ORDER — CHLORHEXIDINE GLUCONATE CLOTH 2 % EX PADS: 6.0000 | MEDICATED_PAD | Freq: Once | CUTANEOUS | Status: DC

## 2013-11-17 MED ORDER — HEPARIN SODIUM (PORCINE) 1000 UNIT/ML IJ SOLN
INTRAMUSCULAR | Status: AC
  Filled 2013-11-17: qty 1

## 2013-11-17 MED ORDER — MIDAZOLAM HCL 5 MG/5ML IJ SOLN
INTRAMUSCULAR | Status: DC | PRN
  Administered 2013-11-17: 1 mg via INTRAVENOUS
  Administered 2013-11-17: 0.5 mg via INTRAVENOUS

## 2013-11-17 SURGICAL SUPPLY — 45 items
ADH SKN CLS APL DERMABOND .7 (GAUZE/BANDAGES/DRESSINGS) ×1
ARMBAND PINK RESTRICT EXTREMIT (MISCELLANEOUS) ×2 IMPLANT
CANISTER SUCTION 2500CC (MISCELLANEOUS) ×2 IMPLANT
CLIP TI MEDIUM 6 (CLIP) ×2 IMPLANT
CLIP TI WIDE RED SMALL 6 (CLIP) ×3 IMPLANT
COVER PROBE W GEL 5X96 (DRAPES) ×3 IMPLANT
COVER SURGICAL LIGHT HANDLE (MISCELLANEOUS) ×2 IMPLANT
DECANTER SPIKE VIAL GLASS SM (MISCELLANEOUS) ×1 IMPLANT
DERMABOND ADVANCED (GAUZE/BANDAGES/DRESSINGS) ×1
DERMABOND ADVANCED .7 DNX12 (GAUZE/BANDAGES/DRESSINGS) ×1 IMPLANT
DRAIN PENROSE 1/2X12 LTX STRL (WOUND CARE) ×1 IMPLANT
DRAIN PENROSE 1/4X12 LTX STRL (WOUND CARE) ×1 IMPLANT
DRAPE U-SHAPE 47X51 STRL (DRAPES) ×1 IMPLANT
ELECT REM PT RETURN 9FT ADLT (ELECTROSURGICAL) ×2
ELECTRODE REM PT RTRN 9FT ADLT (ELECTROSURGICAL) ×1 IMPLANT
GEL ULTRASOUND 20GR AQUASONIC (MISCELLANEOUS) IMPLANT
GLOVE BIO SURGEON STRL SZ 6.5 (GLOVE) ×2 IMPLANT
GLOVE BIO SURGEON STRL SZ7.5 (GLOVE) ×2 IMPLANT
GLOVE BIOGEL PI IND STRL 6.5 (GLOVE) IMPLANT
GLOVE BIOGEL PI IND STRL 7.0 (GLOVE) IMPLANT
GLOVE BIOGEL PI IND STRL 8 (GLOVE) IMPLANT
GLOVE BIOGEL PI INDICATOR 6.5 (GLOVE) ×1
GLOVE BIOGEL PI INDICATOR 7.0 (GLOVE) ×3
GLOVE BIOGEL PI INDICATOR 8 (GLOVE) ×2
GLOVE ECLIPSE 6.0 STRL STRAW (GLOVE) ×1 IMPLANT
GLOVE ECLIPSE 6.5 STRL STRAW (GLOVE) ×2 IMPLANT
GOWN STRL REUS W/ TWL LRG LVL3 (GOWN DISPOSABLE) ×3 IMPLANT
GOWN STRL REUS W/TWL LRG LVL3 (GOWN DISPOSABLE) ×10
KIT BASIN OR (CUSTOM PROCEDURE TRAY) ×2 IMPLANT
KIT ROOM TURNOVER OR (KITS) ×2 IMPLANT
LOOP VESSEL MINI RED (MISCELLANEOUS) IMPLANT
NS IRRIG 1000ML POUR BTL (IV SOLUTION) ×2 IMPLANT
PACK CV ACCESS (CUSTOM PROCEDURE TRAY) ×2 IMPLANT
PAD ARMBOARD 7.5X6 YLW CONV (MISCELLANEOUS) ×4 IMPLANT
SPONGE SURGIFOAM ABS GEL 100 (HEMOSTASIS) IMPLANT
SUT PROLENE 6 0 CC (SUTURE) ×1 IMPLANT
SUT PROLENE 7 0 BV 1 (SUTURE) ×2 IMPLANT
SUT VIC AB 3-0 SH 27 (SUTURE) ×4
SUT VIC AB 3-0 SH 27X BRD (SUTURE) ×1 IMPLANT
SUT VICRYL 4-0 PS2 18IN ABS (SUTURE) ×3 IMPLANT
SYR CONTROL 10ML LL (SYRINGE) ×1 IMPLANT
TOWEL OR 17X24 6PK STRL BLUE (TOWEL DISPOSABLE) ×2 IMPLANT
TOWEL OR 17X26 10 PK STRL BLUE (TOWEL DISPOSABLE) ×2 IMPLANT
UNDERPAD 30X30 INCONTINENT (UNDERPADS AND DIAPERS) ×2 IMPLANT
WATER STERILE IRR 1000ML POUR (IV SOLUTION) ×2 IMPLANT

## 2013-11-17 NOTE — H&P (View-Only) (Signed)
[facility] [place] HISTORY AND PHYSICAL   History of Present Illness:  Patient is a 73 y.o. year old female who presents for placement of a permanent hemodialysis access. The patient is referred by Dr. Hinda Lenis. The patient is right handed .  The patient is not currently on hemodialysis.   Other chronic medical problems include anemia, atrial fibrillation on Coumadin, hypertension, stage IV renal failure all of which are currently stable.  Past Medical History  Diagnosis Date  . Anemia 03/27/2011  . Renal disorder   . Atrial fibrillation   . Murmur 06/04/2006,09/29/10    2D Echo perform both test shows EF 55%  . Chest pain 05/15/2006    stress test perform negative for ischemia  . Systemic hypertension   . Mixed hyperlipidemia   . Kidney disease     stage 4 chronic  . Cancer     uterine    Past Surgical History  Procedure Laterality Date  . Cholecystectomy    . Abdominal hysterectomy    . Breast surgery       Social History History  Substance Use Topics  . Smoking status: Former Smoker    Quit date: 08/08/1999  . Smokeless tobacco: Not on file  . Alcohol Use: No    Family History Family History  Problem Relation Age of Onset  . Diabetes Mother   . Hypertension Mother   . Diabetes Father   . Heart disease Father     before age 35  . Heart attack Father   . Diabetes Sister   . Hyperlipidemia Sister   . Hypertension Sister   . Cancer Brother   . Diabetes Brother   . Heart disease Brother   . Hypertension Brother   . Heart attack Brother     Allergies  No Known Allergies   Current Outpatient Prescriptions  Medication Sig Dispense Refill  . calcitRIOL (ROCALTROL) 0.25 MCG capsule Take 0.25 mcg by mouth daily.      Marland Kitchen diltiazem (CARDIZEM CD) 120 MG 24 hr capsule TAKE ONE (1) CAPSULE EACH DAY  30 capsule  5  . Fe Fum-FA-B Cmp-C-Zn-Mg-Mn-Cu (FERROCITE PLUS PO) Take by mouth once.      . fenofibrate (TRICOR) 145 MG tablet Take 1 tablet  (145 mg total) by mouth daily.  30 tablet  5  . metoprolol tartrate (LOPRESSOR) 25 MG tablet TAKE ONE AND ONE-HALF TABLETS TWO TIMES A DAY  90 tablet  0  . Multiple Vitamins-Minerals (VISION-VITE PRESERVE PO) Take by mouth every morning.      . Omega-3 Fatty Acids (FISH OIL) 1200 MG CAPS Take 1 capsule by mouth 2 (two) times daily.      Marland Kitchen omeprazole (PRILOSEC) 20 MG capsule Take 20 mg by mouth daily.      . pravastatin (PRAVACHOL) 40 MG tablet Take 1 tablet (40 mg total) by mouth at bedtime.  90 tablet  3  . sevelamer (RENVELA) 800 MG tablet Take 800 mg by mouth 3 (three) times daily with meals. And patient also takes wit snacks      . sodium bicarbonate 650 MG tablet Take 650 mg by mouth 3 (three) times daily.      . Vitamin D, Ergocalciferol, (DRISDOL) 50000 UNITS CAPS Take 50,000 Units by mouth every 7 (seven) days.      Marland Kitchen warfarin (COUMADIN) 5 MG tablet Take 1 tablet by mouth daily or as directed  30 tablet  4   No current facility-administered medications for this visit.  ROS:   General:  No weight loss, Fever, chills  HEENT: No recent headaches, no nasal bleeding, no visual changes, no sore throat  Neurologic: No dizziness, blackouts, seizures. No recent symptoms of stroke or mini- stroke. No recent episodes of slurred speech, or temporary blindness.  Cardiac: No recent episodes of chest pain/pressure, no shortness of breath at rest.  + shortness of breath with exertion.  Denies history of atrial fibrillation or irregular heartbeat  Vascular: No history of rest pain in feet.  No history of claudication.  No history of non-healing ulcer, No history of DVT   Pulmonary: No home oxygen, no productive cough, no hemoptysis,  No asthma or wheezing  Musculoskeletal:  [ ]  Arthritis, [ ]  Low back pain,  [ ]  Joint pain  Hematologic:No history of hypercoagulable state.  No history of easy bleeding.  No history of anemia  Gastrointestinal: No hematochezia or melena,  No gastroesophageal  reflux, no trouble swallowing  Urinary: [ ]  chronic Kidney disease, [ ]  on HD - [ ]  MWF or [ ]  TTHS, [ ]  Burning with urination, [ ]  Frequent urination, [ ]  Difficulty urinating;   Skin: No rashes  Psychological: No history of anxiety,  No history of depression   Physical Examination  Filed Vitals:   11/06/13 1031  BP: 137/82  Pulse: 85  Height: 5\' 3"  (1.6 m)  Weight: 222 lb 12.8 oz (101.061 kg)  SpO2: 98%    Body mass index is 39.48 kg/(m^2).  General:  Alert and oriented, no acute distress HEENT: Normal Neck: No bruit or JVD Pulmonary: Clear to auscultation bilaterally Cardiac: Regular Rate and Rhythm without murmur Gastrointestinal: Soft, non-tender, non-distended, no mass Skin: No rash Extremity Pulses:  2+ radial, brachial pulses bilaterally Musculoskeletal: No deformity or edema  Neurologic: Upper and lower extremity motor 5/5 and symmetric  DATA: Vein mapping ultrasound was performed today which shows the cephalic vein is greater than 3 mm in both upper extremities.   ASSESSMENT: Patient needs hemodialysis access. Should she is right-handed we will try to go to the left arm first.   PLAN:  Risks benefits possible complications and procedure details of left arm AV fistula were explained the patient today. These include but are not limited to bleeding infection non-maturation of fistula ischemic steal. She understands and agrees to proceed. We will stop her Coumadin on April 9. She will have a left radiocephalic fistula April 13.  Ruta Hinds, MD [facility] [place] Office: [phone] Pager: [phone]

## 2013-11-17 NOTE — Op Note (Signed)
Procedure: Left Radial Cephalic AV fistula   Preop: ESRD   Postop: ESRD   Anesthesia: MAC with local   Assistant:  Leontine Locket, PA-c   Findings: 3 mm cephalic vein       2.5 mm radial artery   Procedure Details:  The left upper extremity was prepped and draped in usual sterile fashion. A tourniquet was placed on the arm to identify the course of the cephalic vein.  This was several centimeters lateral to the artery so local anesthesia was infiltrated over the vein and the artery.   A longitudinal skin incision was then made over the vein in this location at the distal left forearm. The incision was carried into the subcutaneous tissues down to level cephalic vein. The vein had some spasm but was overall reasonable quality accepting a 3 mm dilator. The vein was dissected free circumferentially and small side branches ligated and divided between silk ties. The distal end was ligated and the vein probed and found to accept up to a 3 mm dilator. This was gently distended with heparinized saline, spatulated, and marked for orientation. Next the radial artery was exposed through a second more medial incision. The incision was made over the artery and carried through the subcutaneous tissues and the overlying fascia.  The radial artery was dissected free circumferentially and several small side branches controlled with clips.  The artery was 2.5 mm in diameter but had a reasonable pulse. The vessel loops were placed proximal and distal to the planned site of arteriotomy. The patient was given 5000 units of intravenous heparin. After appropriate circulation time, the vessel loops were used to control the artery. A longitudinal opening was made in the left radial artery. The vein was tunneled under the skin bridge.  The vein was controlled proximally with a fine bulldog clamp. The vein was then swung over to the artery and sewn end of vein to side of artery using a running 7-0 Prolene suture. Just prior to  completion, the anastomosis was fore bled back bled and thoroughly flushed. The anastomosis was secured, vessel loops released, and there was a palpable thrill in the fistula immediately. After hemostasis was obtained, the subcutaneous tissues of each incision were reapproximated using a running 3-0 Vicryl suture. The skin was then closed with a 4 0 Vicryl subcuticular stitch. Dermabond was applied to the skin incision. The patient tolerated the procedure well and there were no complications. Instrument sponge and needle count were correct at the end of the case. The patient was taken to PACU in stable condition. There was a palpable thrill in the fistula at the end of the case.  Ruta Hinds, MD  [facility] [place]  Office: [phone]  Pager: [phone]

## 2013-11-17 NOTE — Anesthesia Postprocedure Evaluation (Signed)
  Anesthesia Post-op Note  Patient: Sabrina Beck  Procedure(s) Performed: Procedure(s): CREATION OF LEFT RADIOCEPHALIC ARTERIOVENOUS (AV) FISTULA  (Left)  Patient Location: PACU  Anesthesia Type:General  Level of Consciousness: awake  Airway and Oxygen Therapy: Patient Spontanous Breathing  Post-op Pain: mild  Post-op Assessment: Post-op Vital signs reviewed  Post-op Vital Signs: Reviewed  Last Vitals:  Filed Vitals:   11/17/13 0954  BP: 142/61  Pulse: 92  Temp:   Resp: 15    Complications: No apparent anesthesia complications

## 2013-11-17 NOTE — Discharge Instructions (Signed)
11/17/2013 Sabrina Beck [id number] 30-Sep-1940  Surgeon(s): [name], MD  Procedure(s): CREATION OF LEFT RADIOCEPHALIC ARTERIOVENOUS (AV) FISTULA   x Do not fistula graft for 12 weeks   General Anesthesia, Adult, Care After  Refer to this sheet in the next few weeks. These instructions provide you with information on caring for yourself after your procedure. Your health care provider may also give you more specific instructions. Your treatment has been planned according to current medical practices, but problems sometimes occur. Call your health care provider if you have any problems or questions after your procedure.  WHAT TO EXPECT AFTER THE PROCEDURE  After the procedure, it is typical to experience:  Sleepiness.  Nausea and vomiting. HOME CARE INSTRUCTIONS  For the first 24 hours after general anesthesia:  Have a responsible person with you.  Do not drive a car. If you are alone, do not take public transportation.  Do not drink alcohol.  Do not take medicine that has not been prescribed by your health care provider.  Do not sign important papers or make important decisions.  You may resume a normal diet and activities as directed by your health care provider.  Change bandages (dressings) as directed.  If you have questions or problems that seem related to general anesthesia, call the hospital and ask for the anesthetist or anesthesiologist on call. SEEK MEDICAL CARE IF:  You have nausea and vomiting that continue the day after anesthesia.  You develop a rash. SEEK IMMEDIATE MEDICAL CARE IF:  You have difficulty breathing.  You have chest pain.  You have any allergic problems. Document Released: 10/30/2000 Document Revised: 03/26/2013 Document Reviewed: 02/06/2013  [facility] Patient Information 2014 [place], [place].   What to eat:  For your first meals, you should eat lightly; only small meals initially.  If you do not have nausea, you may eat larger meals.   Avoid spicy, greasy and heavy food.    Laceration Care, Adult    A laceration is a cut that goes through all layers of the skin. The cut goes into the tissue beneath the skin.  HOME CARE  For stitches (sutures) or staples:  Keep the cut clean and dry.  If you have a bandage (dressing), change it at least once a day. Change the bandage if it gets wet or dirty, or as told by your doctor.  Wash the cut with soap and water 2 times a day. Rinse the cut with water. Pat it dry with a clean towel.  Put a thin layer of medicated cream on the cut as told by your doctor.  You may shower after the first 24 hours. Do not soak the cut in water until the stitches are removed.  Only take medicines as told by your doctor.  Have your stitches or staples removed as told by your doctor. For skin adhesive strips:  Keep the cut clean and dry.  Do not get the strips wet. You may take a bath, but be careful to keep the cut dry.  If the cut gets wet, pat it dry with a clean towel.  The strips will fall off on their own. Do not remove the strips that are still stuck to the cut. For wound glue:  You may shower or take baths. Do not soak or scrub the cut. Do not swim. Avoid heavy sweating until the glue falls off on its own. After a shower or bath, pat the cut dry with a clean towel.  Do not put medicine on your cut until the glue falls off.  If you have a bandage, do not put tape over the glue.  Avoid lots of sunlight or tanning lamps until the glue falls off. Put sunscreen on the cut for the first year to reduce your scar.  The glue will fall off on its own. Do not pick at the glue. You may need a tetanus shot if:  You cannot remember when you had your last tetanus shot.  You have never had a tetanus shot. If you need a tetanus shot and you choose not to have one, you may get tetanus. Sickness from tetanus can be serious.  GET HELP RIGHT AWAY IF:  Your pain does not get better with medicine.  Your arm, hand,  leg, or foot loses feeling (numbness) or changes color.  Your cut is bleeding.  Your joint feels weak, or you cannot use your joint.  You have painful lumps on your body.  Your cut is red, puffy (swollen), or painful.  You have a red line on the skin near the cut.  You have yellowish-white fluid (pus) coming from the cut.  You have a fever.  You have a bad smell coming from the cut or bandage.  Your cut breaks open before or after stitches are removed.  You notice something coming out of the cut, such as wood or glass.  You cannot move a finger or toe. MAKE SURE YOU:  Understand these instructions.  Will watch your condition.  Will get help right away if you are not doing well or get worse. Document Released: 01/10/2008 Document Revised: 10/16/2011 Document Reviewed: 01/17/2011  [facility] Patient Information 2014 [place].

## 2013-11-17 NOTE — Telephone Encounter (Addendum)
Message copied by Gena Fray on Mon Nov 17, 2013  3:14 PM ------      Message from: [place], [place] K      Created: Mon Nov 17, 2013  9:41 AM      Regarding: Schedule                   ----- Message -----         From: Gabriel Earing, PA-C         Sent: 11/17/2013   9:23 AM           To: [facility]vs Charge Pool            S/p left radio cephalic AVF [id number].  F/u with Dr. Oneida Alar in 4-6 weeks with duplex.             Thanks,      Samantha ------  11/17/13: spoke with pt, dpm

## 2013-11-17 NOTE — Transfer of Care (Signed)
Immediate Anesthesia Transfer of Care Note  Patient: Sabrina Beck  Procedure(s) Performed: Procedure(s): CREATION OF LEFT RADIOCEPHALIC ARTERIOVENOUS (AV) FISTULA  (Left)  Patient Location: PACU  Anesthesia Type:MAC  Level of Consciousness: awake, alert , oriented and patient cooperative  Airway & Oxygen Therapy: Patient Spontanous Breathing, Room Air SPO2 96  Post-op Assessment: Report given to PACU RN, Post -op Vital signs reviewed and stable and Patient moving all extremities  Post vital signs: Reviewed and stable  Complications: No apparent anesthesia complications

## 2013-11-17 NOTE — Interval H&P Note (Signed)
History and Physical Interval Note:  11/17/2013 7:25 AM  Sabrina Beck  has presented today for surgery, with the diagnosis of chronic kidney disease  The various methods of treatment have been discussed with the patient and family. After consideration of risks, benefits and other options for treatment, the patient has consented to  Procedure(s): ARTERIOVENOUS (AV) FISTULA CREATION - RADIOCEPHALIC (Left) as a surgical intervention .  The patient's history has been reviewed, patient examined, no change in status, stable for surgery.  I have reviewed the patient's chart and labs.  Questions were answered to the patient's satisfaction.     [name]

## 2013-11-17 NOTE — Anesthesia Preprocedure Evaluation (Addendum)
Anesthesia Evaluation  Patient identified by MRN, date of birth, ID band Patient awake    Reviewed: Allergy & Precautions, H&P , NPO status , Patient's Chart, lab work & pertinent test results, reviewed documented beta blocker date and time   History of Anesthesia Complications (+) PONV  Airway Mallampati: II TM Distance: >3 FB Neck ROM: Full    Dental  (+) Teeth Intact, Dental Advisory Given   Pulmonary former smoker,          Cardiovascular hypertension, Pt. on home beta blockers and Pt. on medications + dysrhythmias Atrial Fibrillation     Neuro/Psych    GI/Hepatic GERD-  ,  Endo/Other    Renal/GU CRFRenal disease     Musculoskeletal   Abdominal   Peds  Hematology   Anesthesia Other Findings Receding chin  Reproductive/Obstetrics                        Anesthesia Physical Anesthesia Plan  ASA: III  Anesthesia Plan: MAC   Post-op Pain Management:    Induction: Intravenous  Airway Management Planned: Simple Face Mask  Additional Equipment:   Intra-op Plan:   Post-operative Plan:   Informed Consent: I have reviewed the patients History and Physical, chart, labs and discussed the procedure including the risks, benefits and alternatives for the proposed anesthesia with the patient or authorized representative who has indicated his/her understanding and acceptance.   Dental advisory given  Plan Discussed with: CRNA and Anesthesiologist  Anesthesia Plan Comments:         Anesthesia Quick Evaluation

## 2013-11-18 ENCOUNTER — Encounter (HOSPITAL_COMMUNITY): Payer: Self-pay | Admitting: Vascular Surgery

## 2013-11-21 ENCOUNTER — Ambulatory Visit (HOSPITAL_COMMUNITY): Payer: Medicare HMO

## 2013-11-21 ENCOUNTER — Inpatient Hospital Stay (HOSPITAL_COMMUNITY): Admission: RE | Admit: 2013-11-21 | Payer: Medicare HMO | Source: Ambulatory Visit

## 2013-11-27 ENCOUNTER — Encounter (HOSPITAL_COMMUNITY)
Admission: RE | Admit: 2013-11-27 | Discharge: 2013-11-27 | Disposition: A | Discharge status: Home / Self Care | Payer: Medicare HMO | Source: Ambulatory Visit | Attending: Nephrology | Admitting: Nephrology

## 2013-11-27 LAB — HEMOGLOBIN AND HEMATOCRIT, BLOOD
HCT: 27.6 % — ABNORMAL LOW (ref 36.0–46.0)
Hemoglobin: 8.9 g/dL — ABNORMAL LOW (ref 12.0–15.0)

## 2013-11-27 MED ORDER — EPOETIN ALFA 10000 UNIT/ML IJ SOLN
INTRAMUSCULAR | Status: AC
  Filled 2013-11-27: qty 2

## 2013-11-27 MED ORDER — EPOETIN ALFA 10000 UNIT/ML IJ SOLN
12000.0000 [IU] | INTRAMUSCULAR | Status: DC
  Administered 2013-11-27: 12000 [IU] via SUBCUTANEOUS

## 2013-12-10 ENCOUNTER — Ambulatory Visit (INDEPENDENT_AMBULATORY_CARE_PROVIDER_SITE_OTHER): Payer: Medicare HMO | Admitting: *Deleted

## 2013-12-10 DIAGNOSIS — Z7901 Long term (current) use of anticoagulants: Secondary | ICD-10-CM

## 2013-12-10 DIAGNOSIS — I4891 Unspecified atrial fibrillation: Secondary | ICD-10-CM

## 2013-12-10 DIAGNOSIS — Z5181 Encounter for therapeutic drug level monitoring: Secondary | ICD-10-CM

## 2013-12-10 LAB — POCT INR: INR: 3.9

## 2013-12-11 ENCOUNTER — Encounter (HOSPITAL_COMMUNITY): Admission: RE | Admit: 2013-12-11 | Payer: Medicare HMO | Source: Ambulatory Visit

## 2013-12-12 ENCOUNTER — Encounter (HOSPITAL_COMMUNITY)
Admission: RE | Admit: 2013-12-12 | Discharge: 2013-12-12 | Disposition: A | Discharge status: Home / Self Care | Payer: Medicare HMO | Source: Ambulatory Visit | Attending: Nephrology | Admitting: Nephrology

## 2013-12-12 DIAGNOSIS — N185 Chronic kidney disease, stage 5: Secondary | ICD-10-CM | POA: Insufficient documentation

## 2013-12-12 DIAGNOSIS — D631 Anemia in chronic kidney disease: Secondary | ICD-10-CM | POA: Insufficient documentation

## 2013-12-12 DIAGNOSIS — Z79899 Other long term (current) drug therapy: Secondary | ICD-10-CM | POA: Insufficient documentation

## 2013-12-12 DIAGNOSIS — N039 Chronic nephritic syndrome with unspecified morphologic changes: Principal | ICD-10-CM

## 2013-12-12 LAB — RENAL FUNCTION PANEL
Albumin: 3.3 g/dL — ABNORMAL LOW (ref 3.5–5.2)
BUN: 51 mg/dL — AB (ref 6–23)
CO2: 19 mEq/L (ref 19–32)
Calcium: 8.3 mg/dL — ABNORMAL LOW (ref 8.4–10.5)
Chloride: 105 mEq/L (ref 96–112)
Creatinine, Ser: 5.13 mg/dL — ABNORMAL HIGH (ref 0.50–1.10)
GFR calc Af Amer: 9 mL/min — ABNORMAL LOW (ref >90)
GFR calc non Af Amer: 7 mL/min — ABNORMAL LOW (ref >90)
GLUCOSE: 157 mg/dL — AB (ref 70–99)
PHOSPHORUS: 5.5 mg/dL — AB (ref 2.3–4.6)
Potassium: 3.8 mEq/L (ref 3.7–5.3)
SODIUM: 141 meq/L (ref 137–147)

## 2013-12-12 LAB — HEMOGLOBIN AND HEMATOCRIT, BLOOD
HCT: 25.6 % — ABNORMAL LOW (ref 36.0–46.0)
HEMOGLOBIN: 8.6 g/dL — AB (ref 12.0–15.0)

## 2013-12-12 MED ORDER — EPOETIN ALFA 10000 UNIT/ML IJ SOLN
12000.0000 [IU] | INTRAMUSCULAR | Status: DC
  Administered 2013-12-12: 12000 [IU] via SUBCUTANEOUS

## 2013-12-15 NOTE — Progress Notes (Signed)
Results for Sabrina Beck, Sabrina Beck (MRN [id number]) as of 12/15/2013 11:49  Ref. Range 12/12/2013 15:30  Hemoglobin Latest Range: 12.0-15.0 g/dL 8.6 (L)  HCT Latest Range: 36.0-46.0 % 25.6 (L)  Results for Sabrina Beck, Sabrina Beck (MRN [id number]) as of 12/15/2013 11:51  Ref. Range 12/12/2013 15:30  Sodium Latest Range: 137-147 mEq/L 141  Potassium Latest Range: 3.7-5.3 mEq/L 3.8  Chloride Latest Range: 96-112 mEq/L 105  CO2 Latest Range: 19-32 mEq/L 19  BUN Latest Range: 6-23 mg/dL 51 (H)  Creatinine Latest Range: 0.50-1.10 mg/dL 5.13 (H)  Calcium Latest Range: 8.4-10.5 mg/dL 8.3 (L)  GFR calc non Af Amer Latest Range: >90 mL/min 7 (L)  GFR calc Af Amer Latest Range: >90 mL/min 9 (L)  Glucose Latest Range: 70-99 mg/dL 157 (H)  Phosphorus Latest Range: 2.3-4.6 mg/dL 5.5 (H)  Albumin Latest Range: 3.5-5.2 g/dL 3.3 (L)

## 2013-12-21 ENCOUNTER — Other Ambulatory Visit: Payer: Self-pay

## 2013-12-21 ENCOUNTER — Inpatient Hospital Stay (HOSPITAL_COMMUNITY)
Admission: EM | Admit: 2013-12-21 | Discharge: 2013-12-25 | DRG: 291 | Disposition: A | Discharge status: Home / Self Care | Payer: Medicare HMO | Attending: Internal Medicine | Admitting: Internal Medicine

## 2013-12-21 ENCOUNTER — Encounter (HOSPITAL_COMMUNITY): Payer: Self-pay | Admitting: Emergency Medicine

## 2013-12-21 ENCOUNTER — Emergency Department (HOSPITAL_COMMUNITY): Payer: Medicare HMO

## 2013-12-21 DIAGNOSIS — I5031 Acute diastolic (congestive) heart failure: Secondary | ICD-10-CM

## 2013-12-21 DIAGNOSIS — Z87891 Personal history of nicotine dependence: Secondary | ICD-10-CM

## 2013-12-21 DIAGNOSIS — T502X5A Adverse effect of carbonic-anhydrase inhibitors, benzothiadiazides and other diuretics, initial encounter: Secondary | ICD-10-CM | POA: Diagnosis present

## 2013-12-21 DIAGNOSIS — Z833 Family history of diabetes mellitus: Secondary | ICD-10-CM

## 2013-12-21 DIAGNOSIS — Z79899 Other long term (current) drug therapy: Secondary | ICD-10-CM

## 2013-12-21 DIAGNOSIS — N185 Chronic kidney disease, stage 5: Secondary | ICD-10-CM

## 2013-12-21 DIAGNOSIS — Z8542 Personal history of malignant neoplasm of other parts of uterus: Secondary | ICD-10-CM

## 2013-12-21 DIAGNOSIS — I4891 Unspecified atrial fibrillation: Secondary | ICD-10-CM

## 2013-12-21 DIAGNOSIS — E782 Mixed hyperlipidemia: Secondary | ICD-10-CM

## 2013-12-21 DIAGNOSIS — Z8249 Family history of ischemic heart disease and other diseases of the circulatory system: Secondary | ICD-10-CM

## 2013-12-21 DIAGNOSIS — Z5181 Encounter for therapeutic drug level monitoring: Secondary | ICD-10-CM

## 2013-12-21 DIAGNOSIS — N289 Disorder of kidney and ureter, unspecified: Secondary | ICD-10-CM

## 2013-12-21 DIAGNOSIS — I5032 Chronic diastolic (congestive) heart failure: Secondary | ICD-10-CM | POA: Diagnosis present

## 2013-12-21 DIAGNOSIS — N039 Chronic nephritic syndrome with unspecified morphologic changes: Secondary | ICD-10-CM

## 2013-12-21 DIAGNOSIS — I509 Heart failure, unspecified: Secondary | ICD-10-CM

## 2013-12-21 DIAGNOSIS — I5033 Acute on chronic diastolic (congestive) heart failure: Secondary | ICD-10-CM

## 2013-12-21 DIAGNOSIS — D649 Anemia, unspecified: Secondary | ICD-10-CM

## 2013-12-21 DIAGNOSIS — I10 Essential (primary) hypertension: Secondary | ICD-10-CM

## 2013-12-21 DIAGNOSIS — R0600 Dyspnea, unspecified: Secondary | ICD-10-CM | POA: Diagnosis present

## 2013-12-21 DIAGNOSIS — N2581 Secondary hyperparathyroidism of renal origin: Secondary | ICD-10-CM | POA: Diagnosis present

## 2013-12-21 DIAGNOSIS — K219 Gastro-esophageal reflux disease without esophagitis: Secondary | ICD-10-CM | POA: Diagnosis present

## 2013-12-21 DIAGNOSIS — D631 Anemia in chronic kidney disease: Secondary | ICD-10-CM | POA: Diagnosis present

## 2013-12-21 DIAGNOSIS — Z7901 Long term (current) use of anticoagulants: Secondary | ICD-10-CM

## 2013-12-21 DIAGNOSIS — E876 Hypokalemia: Secondary | ICD-10-CM | POA: Diagnosis present

## 2013-12-21 DIAGNOSIS — I120 Hypertensive chronic kidney disease with stage 5 chronic kidney disease or end stage renal disease: Secondary | ICD-10-CM | POA: Diagnosis present

## 2013-12-21 DIAGNOSIS — N186 End stage renal disease: Secondary | ICD-10-CM

## 2013-12-21 HISTORY — DX: Diverticulosis of intestine, part unspecified, without perforation or abscess without bleeding: K57.90

## 2013-12-21 LAB — CBC WITH DIFFERENTIAL/PLATELET
BASOS ABS: 0 10*3/uL (ref 0.0–0.1)
Basophils Relative: 0 % (ref 0–1)
EOS ABS: 0.1 10*3/uL (ref 0.0–0.7)
EOS PCT: 1 % (ref 0–5)
HEMATOCRIT: 24.3 % — AB (ref 36.0–46.0)
Hemoglobin: 7.8 g/dL — ABNORMAL LOW (ref 12.0–15.0)
Lymphocytes Relative: 20 % (ref 12–46)
Lymphs Abs: 1.7 10*3/uL (ref 0.7–4.0)
MCH: 32.1 pg (ref 26.0–34.0)
MCHC: 32.1 g/dL (ref 30.0–36.0)
MCV: 100 fL (ref 78.0–100.0)
MONO ABS: 0.7 10*3/uL (ref 0.1–1.0)
Monocytes Relative: 8 % (ref 3–12)
Neutro Abs: 6 10*3/uL (ref 1.7–7.7)
Neutrophils Relative %: 71 % (ref 43–77)
Platelets: 241 10*3/uL (ref 150–400)
RBC: 2.43 MIL/uL — ABNORMAL LOW (ref 3.87–5.11)
RDW: 15.5 % (ref 11.5–15.5)
WBC: 8.6 10*3/uL (ref 4.0–10.5)

## 2013-12-21 LAB — BASIC METABOLIC PANEL
BUN: 55 mg/dL — ABNORMAL HIGH (ref 6–23)
CALCIUM: 8.6 mg/dL (ref 8.4–10.5)
CO2: 20 mEq/L (ref 19–32)
CREATININE: 4.73 mg/dL — AB (ref 0.50–1.10)
Chloride: 107 mEq/L (ref 96–112)
GFR, EST AFRICAN AMERICAN: 10 mL/min — AB (ref >90)
GFR, EST NON AFRICAN AMERICAN: 8 mL/min — AB (ref >90)
Glucose, Bld: 105 mg/dL — ABNORMAL HIGH (ref 70–99)
Potassium: 3.4 mEq/L — ABNORMAL LOW (ref 3.7–5.3)
Sodium: 142 mEq/L (ref 137–147)

## 2013-12-21 LAB — TROPONIN I: Troponin I: <0.3 ng/mL (ref <0.30)

## 2013-12-21 LAB — SAMPLE TO BLOOD BANK

## 2013-12-21 LAB — PRO B NATRIURETIC PEPTIDE: Pro B Natriuretic peptide (BNP): 8395 pg/mL — ABNORMAL HIGH (ref 0–125)

## 2013-12-21 LAB — PROTIME-INR
INR: 2.63 — AB (ref 0.00–1.49)
Prothrombin Time: 27.2 seconds — ABNORMAL HIGH (ref 11.6–15.2)

## 2013-12-21 MED ORDER — WARFARIN - PHARMACIST DOSING INPATIENT: Freq: Every day | Status: DC

## 2013-12-21 MED ORDER — SODIUM BICARBONATE 650 MG PO TABS
650.0000 mg | ORAL_TABLET | Freq: Three times a day (TID) | ORAL | Status: DC
  Administered 2013-12-21 (×2): 650 mg via ORAL
  Filled 2013-12-21 (×2): qty 1

## 2013-12-21 MED ORDER — VITAMIN D (ERGOCALCIFEROL) 1.25 MG (50000 UNIT) PO CAPS
50000.0000 [IU] | ORAL_CAPSULE | ORAL | Status: DC
  Administered 2013-12-23: 50000 [IU] via ORAL
  Filled 2013-12-21: qty 1

## 2013-12-21 MED ORDER — FUROSEMIDE 10 MG/ML IJ SOLN
60.0000 mg | Freq: Two times a day (BID) | INTRAMUSCULAR | Status: DC
  Administered 2013-12-22: 60 mg via INTRAVENOUS
  Filled 2013-12-21: qty 6

## 2013-12-21 MED ORDER — OMEGA-3-ACID ETHYL ESTERS 1 G PO CAPS
1.0000 g | ORAL_CAPSULE | Freq: Two times a day (BID) | ORAL | Status: DC
  Administered 2013-12-21 – 2013-12-25 (×8): 1 g via ORAL
  Filled 2013-12-21 (×8): qty 1

## 2013-12-21 MED ORDER — SEVELAMER CARBONATE 800 MG PO TABS
800.0000 mg | ORAL_TABLET | Freq: Three times a day (TID) | ORAL | Status: DC
  Administered 2013-12-22 – 2013-12-25 (×11): 800 mg via ORAL
  Filled 2013-12-21 (×11): qty 1

## 2013-12-21 MED ORDER — HEPARIN SODIUM (PORCINE) 5000 UNIT/ML IJ SOLN: 5000.0000 [IU] | Freq: Three times a day (TID) | INTRAMUSCULAR | Status: DC

## 2013-12-21 MED ORDER — PRAVASTATIN SODIUM 40 MG PO TABS
40.0000 mg | ORAL_TABLET | Freq: Every day | ORAL | Status: DC
  Administered 2013-12-23 – 2013-12-24 (×2): 40 mg via ORAL
  Filled 2013-12-21 (×5): qty 1

## 2013-12-21 MED ORDER — NON FORMULARY: 40.0000 mg | Freq: Every evening | Status: DC

## 2013-12-21 MED ORDER — ONDANSETRON HCL 4 MG PO TABS: 4.0000 mg | ORAL_TABLET | Freq: Four times a day (QID) | ORAL | Status: DC | PRN

## 2013-12-21 MED ORDER — POTASSIUM CHLORIDE CRYS ER 20 MEQ PO TBCR
40.0000 meq | EXTENDED_RELEASE_TABLET | Freq: Once | ORAL | Status: AC
Start: 2013-12-21 — End: 2013-12-21
  Administered 2013-12-21: 40 meq via ORAL
  Filled 2013-12-21: qty 2

## 2013-12-21 MED ORDER — SODIUM CHLORIDE 0.9 % IJ SOLN
3.0000 mL | Freq: Two times a day (BID) | INTRAMUSCULAR | Status: DC
  Administered 2013-12-21 – 2013-12-24 (×7): 3 mL via INTRAVENOUS

## 2013-12-21 MED ORDER — FENOFIBRATE 160 MG PO TABS
160.0000 mg | ORAL_TABLET | Freq: Every day | ORAL | Status: DC
  Administered 2013-12-22 – 2013-12-25 (×4): 160 mg via ORAL
  Filled 2013-12-21 (×6): qty 1

## 2013-12-21 MED ORDER — WARFARIN SODIUM 5 MG PO TABS
5.0000 mg | ORAL_TABLET | Freq: Once | ORAL | Status: AC
  Administered 2013-12-21: 5 mg via ORAL
  Filled 2013-12-21 (×2): qty 1

## 2013-12-21 MED ORDER — FERROCITE PLUS 106-1 MG PO TABS
1.0000 | ORAL_TABLET | Freq: Every morning | ORAL | Status: DC
  Filled 2013-12-21: qty 1

## 2013-12-21 MED ORDER — ALBUTEROL SULFATE (2.5 MG/3ML) 0.083% IN NEBU
2.5000 mg | INHALATION_SOLUTION | Freq: Once | RESPIRATORY_TRACT | Status: AC
  Administered 2013-12-21: 2.5 mg via RESPIRATORY_TRACT
  Filled 2013-12-21: qty 3

## 2013-12-21 MED ORDER — PANTOPRAZOLE SODIUM 40 MG PO TBEC
40.0000 mg | DELAYED_RELEASE_TABLET | Freq: Every day | ORAL | Status: DC
  Administered 2013-12-22 – 2013-12-25 (×4): 40 mg via ORAL
  Filled 2013-12-21 (×4): qty 1

## 2013-12-21 MED ORDER — ACETAMINOPHEN 500 MG PO TABS: 1000.0000 mg | ORAL_TABLET | Freq: Every day | ORAL | Status: DC | PRN

## 2013-12-21 MED ORDER — ONDANSETRON HCL 4 MG/2ML IJ SOLN: 4.0000 mg | Freq: Four times a day (QID) | INTRAMUSCULAR | Status: DC | PRN

## 2013-12-21 MED ORDER — WARFARIN SODIUM 2.5 MG PO TABS: 2.5000 mg | ORAL_TABLET | Freq: Every day | ORAL | Status: DC

## 2013-12-21 MED ORDER — FUROSEMIDE 10 MG/ML IJ SOLN
40.0000 mg | Freq: Once | INTRAMUSCULAR | Status: AC
  Administered 2013-12-21: 40 mg via INTRAVENOUS
  Filled 2013-12-21: qty 4

## 2013-12-21 MED ORDER — METOPROLOL TARTRATE 25 MG PO TABS
25.0000 mg | ORAL_TABLET | Freq: Two times a day (BID) | ORAL | Status: DC
  Administered 2013-12-21 – 2013-12-23 (×4): 25 mg via ORAL
  Filled 2013-12-21 (×4): qty 1

## 2013-12-21 MED ORDER — FUROSEMIDE 10 MG/ML IJ SOLN: 60.0000 mg | Freq: Two times a day (BID) | INTRAMUSCULAR | Status: DC

## 2013-12-21 MED ORDER — DILTIAZEM HCL ER COATED BEADS 120 MG PO CP24
120.0000 mg | ORAL_CAPSULE | Freq: Every day | ORAL | Status: DC
  Administered 2013-12-22 – 2013-12-24 (×3): 120 mg via ORAL
  Filled 2013-12-21 (×3): qty 1

## 2013-12-21 MED ORDER — PANTOPRAZOLE SODIUM 40 MG PO TBEC: 40.0000 mg | DELAYED_RELEASE_TABLET | Freq: Every day | ORAL | Status: DC

## 2013-12-21 MED ORDER — CALCITRIOL 0.25 MCG PO CAPS
0.2500 ug | ORAL_CAPSULE | Freq: Every day | ORAL | Status: DC
  Administered 2013-12-22 – 2013-12-25 (×4): 0.25 ug via ORAL
  Filled 2013-12-21 (×4): qty 1

## 2013-12-21 NOTE — Progress Notes (Signed)
Called to get report after rapid response situation. RN took another patient to ICU.

## 2013-12-21 NOTE — ED Notes (Signed)
Pt presents to er for further evaluation of sob, pt states that she has been sob for several days, was seen in pcp office this week, given z-pack and a shot,( pt unsure of what the shot was for) but states that she is not getting any better, has very little sputum production that is clear in color, denies any pain, states "it hurts to breathe" denies any swelling,

## 2013-12-21 NOTE — ED Notes (Signed)
Attempted to call report to floor nurse, states she is busy and will call me back

## 2013-12-21 NOTE — ED Provider Notes (Signed)
CSN: [id number]     Arrival date & time 12/21/13  1346 History   First MD Initiated Contact with Patient 12/21/13 1407     Chief Complaint  Patient presents with  . Shortness of Breath    HPI Pt was seen at 1425. Per pt and her family, c/o gradual onset and worsening of persistent SOB for the past 1 week. SOB worsens on exertion. Pt states she was evaluated by her PMD this past week for same, "got a shot and some zpack," for unknown diagnosis. Pt states she has been taking the medications without improvement in her symptoms. Denies CP/palpitations, no cough, no abd pain, no N/V/D, no back pain.     Past Medical History  Diagnosis Date  . Anemia 03/27/2011  . Atrial fibrillation   . Murmur 06/04/2006,09/29/10    2D Echo perform both test shows EF 55%  . Chest pain 05/15/2006    stress test perform negative for ischemia  . Systemic hypertension   . Mixed hyperlipidemia   . Cancer     uterine  . Complication of anesthesia   . PONV (postoperative nausea and vomiting)   . Dysrhythmia     afib  . Renal disorder   . Kidney disease     stage 4 chronic  . GERD (gastroesophageal reflux disease)   . Diverticulosis    Past Surgical History  Procedure Laterality Date  . Cholecystectomy    . Abdominal hysterectomy    . Breast surgery Left     tumors non canerous 2- 3 different surgeries  . Knee arthroscopy    . Av fistula placement Left 11/17/2013    Procedure: CREATION OF LEFT RADIOCEPHALIC ARTERIOVENOUS (AV) FISTULA ;  Surgeon: [name], MD;  Location: [facility] OR;  Service: Vascular;  Laterality: Left;   Family History  Problem Relation Age of Onset  . Diabetes Mother   . Hypertension Mother   . Diabetes Father   . Heart disease Father     before age 72  . Heart attack Father   . Diabetes Sister   . Hyperlipidemia Sister   . Hypertension Sister   . Cancer Brother   . Diabetes Brother   . Heart disease Brother   . Hypertension Brother   . Heart attack Brother     History  Substance Use Topics  . Smoking status: Former Smoker    Quit date: 08/08/1999  . Smokeless tobacco: Not on file  . Alcohol Use: No    Review of Systems ROS: Statement: All systems negative except as marked or noted in the HPI; Constitutional: Negative for fever and chills. ; ; Eyes: Negative for eye pain, redness and discharge. ; ; ENMT: Negative for ear pain, hoarseness, nasal congestion, sinus pressure and sore throat. ; ; Cardiovascular: +SOB, DOE. Negative for chest pain, palpitations, diaphoresis, and peripheral edema. ; ; Respiratory: Negative for cough, wheezing and stridor. ; ; Gastrointestinal: Negative for nausea, vomiting, diarrhea, abdominal pain, blood in stool, hematemesis, jaundice and rectal bleeding. . ; ; Genitourinary: Negative for dysuria, flank pain and hematuria. ; ; Musculoskeletal: Negative for back pain and neck pain. Negative for swelling and trauma.; ; Skin: Negative for pruritus, rash, abrasions, blisters, bruising and skin lesion.; ; Neuro: Negative for headache, lightheadedness and neck stiffness. Negative for weakness, altered level of consciousness , altered mental status, extremity weakness, paresthesias, involuntary movement, seizure and syncope.      Allergies  Review of patient's allergies indicates no known allergies.  Home  Medications   Prior to Admission medications   Medication Sig Start Date End Date Taking? Authorizing Provider  acetaminophen (TYLENOL) 500 MG tablet Take 1,000 mg by mouth daily as needed for moderate pain.   Yes Historical Provider, MD  azithromycin (ZITHROMAX) 250 MG tablet Take 1 tablet by mouth daily. 12/18/13  Yes Historical Provider, MD  calcitRIOL (ROCALTROL) 0.25 MCG capsule Take 0.25 mcg by mouth daily.   Yes Historical Provider, MD  diltiazem (CARDIZEM CD) 120 MG 24 hr capsule Take 120 mg by mouth daily.   Yes Historical Provider, MD  Fe Fum-FA-B Cmp-C-Zn-Mg-Mn-Cu (FERROCITE PLUS PO) Take 1 tablet by mouth daily.     Yes Historical Provider, MD  fenofibrate (TRICOR) 145 MG tablet TAKE ONE (1) TABLET BY MOUTH EVERY DAY 11/11/13  Yes Mihai Croitoru, MD  metoprolol tartrate (LOPRESSOR) 25 MG tablet TAKE ONE AND ONE-HALF TABLETS TWO TIMES DAILY. 11/11/13  Yes Mihai Croitoru, MD  Multiple Vitamins-Minerals (VISION-VITE PRESERVE PO) Take 1 tablet by mouth 2 (two) times daily.    Yes Historical Provider, MD  Omega-3 Fatty Acids (FISH OIL) 1200 MG CAPS Take 1 capsule by mouth 2 (two) times daily.   Yes Historical Provider, MD  omeprazole (PRILOSEC) 20 MG capsule Take 20 mg by mouth daily.   Yes Historical Provider, MD  pravastatin (PRAVACHOL) 40 MG tablet Take 1 tablet (40 mg total) by mouth at bedtime. 10/21/13  Yes Mihai Croitoru, MD  sevelamer (RENVELA) 800 MG tablet Take 800 mg by mouth 3 (three) times daily with meals. And patient also takes wit snacks   Yes Historical Provider, MD  sodium bicarbonate 650 MG tablet Take 650 mg by mouth 3 (three) times daily.   Yes Historical Provider, MD  Vitamin D, Ergocalciferol, (DRISDOL) 50000 UNITS CAPS Take 50,000 Units by mouth every 7 (seven) days. Takes on Tuesday.   Yes Historical Provider, MD  warfarin (COUMADIN) 5 MG tablet Take 2.5-5 mg by mouth daily. 1 tablet (5 mg) on sun,tues and thurs and all other days take half tab (2.5 mg)   Yes Historical Provider, MD   BP 143/128  Pulse 111  Temp(Src) 98.2 F (36.8 C) (Oral)  Resp 20  Ht 5' 3.5" (1.613 m)  Wt 220 lb (99.791 kg)  BMI 38.36 kg/m2  SpO2 99% Physical Exam 1430: Physical examination:  Nursing notes reviewed; Vital signs and O2 SAT reviewed;  Constitutional: Well developed, Well nourished, Well hydrated, In no acute distress; Head:  Normocephalic, atraumatic; Eyes: EOMI, PERRL, No scleral icterus; ENMT: Mouth and pharynx normal, Mucous membranes moist; Neck: Supple, Full range of motion, No lymphadenopathy; Cardiovascular: Irregular irregular rate and rhythm, No gallop; Respiratory: Breath sounds coarse & equal  bilaterally, No wheezes.  Speaking full sentences with ease, Normal respiratory effort/excursion; Chest: Nontender, Movement normal; Abdomen: Soft, Nontender, Nondistended, Normal bowel sounds; Genitourinary: No CVA tenderness; Extremities: Pulses normal, No tenderness, +trace pedal edema bilat without calf edema or asymmetry.; Neuro: AA&Ox3, vague historian. Major CN grossly intact.  Speech clear. No gross focal motor or sensory deficits in extremities.; Skin: Color normal, Warm, Dry.   ED Course  Procedures     EKG Interpretation None      MDM  MDM Reviewed: previous chart, nursing note and vitals Reviewed previous: labs and ECG Interpretation: labs, ECG and x-ray    Results for orders placed during the hospital encounter of 12/21/13  CBC WITH DIFFERENTIAL      Result Value Ref Range   WBC 8.6  4.0 - 10.5 K/uL  RBC 2.43 (*) 3.87 - 5.11 MIL/uL   Hemoglobin 7.8 (*) 12.0 - 15.0 g/dL   HCT 24.3 (*) 36.0 - 46.0 %   MCV 100.0  78.0 - 100.0 fL   MCH 32.1  26.0 - 34.0 pg   MCHC 32.1  30.0 - 36.0 g/dL   RDW 15.5  11.5 - 15.5 %   Platelets 241  150 - 400 K/uL   Neutrophils Relative % 71  43 - 77 %   Neutro Abs 6.0  1.7 - 7.7 K/uL   Lymphocytes Relative 20  12 - 46 %   Lymphs Abs 1.7  0.7 - 4.0 K/uL   Monocytes Relative 8  3 - 12 %   Monocytes Absolute 0.7  0.1 - 1.0 K/uL   Eosinophils Relative 1  0 - 5 %   Eosinophils Absolute 0.1  0.0 - 0.7 K/uL   Basophils Relative 0  0 - 1 %   Basophils Absolute 0.0  0.0 - 0.1 K/uL  BASIC METABOLIC PANEL      Result Value Ref Range   Sodium 142  137 - 147 mEq/L   Potassium 3.4 (*) 3.7 - 5.3 mEq/L   Chloride 107  96 - 112 mEq/L   CO2 20  19 - 32 mEq/L   Glucose, Bld 105 (*) 70 - 99 mg/dL   BUN 55 (*) 6 - 23 mg/dL   Creatinine, Ser 4.73 (*) 0.50 - 1.10 mg/dL   Calcium 8.6  8.4 - 10.5 mg/dL   GFR calc non Af Amer 8 (*) >90 mL/min   GFR calc Af Amer 10 (*) >90 mL/min  TROPONIN I      Result Value Ref Range   Troponin I <0.30  <0.30  ng/mL  PRO B NATRIURETIC PEPTIDE      Result Value Ref Range   Pro B Natriuretic peptide (BNP) 8395.0 (*) 0 - 125 pg/mL  PROTIME-INR      Result Value Ref Range   Prothrombin Time 27.2 (*) 11.6 - 15.2 seconds   INR 2.63 (*) 0.00 - 1.49  SAMPLE TO BLOOD BANK      Result Value Ref Range   Blood Bank Specimen SAMPLE AVAILABLE FOR TESTING     Sample Expiration 12/24/2013     Dg Chest 2 View 12/21/2013   CLINICAL DATA:  Shortness of breath.  EXAM: CHEST  2 VIEW  COMPARISON:  November 17, 2013  FINDINGS: The mediastinal contour is normal. The heart size is enlarged. There is central pulmonary edema. There is no pleural effusion or focal pneumonia. The soft tissues and osseous structures are stable.  IMPRESSION: Mild congestive heart failure.   Electronically Signed   By: Abelardo Diesel M.D.   On: 12/21/2013 15:20    Results for KEOSHIA, KREITER (MRN [id number]) as of 12/21/2013 15:38  Ref. Range 11/13/2013 14:30 11/17/2013 06:19 11/27/2013 15:00 12/12/2013 15:30 12/21/2013 14:36  Hemoglobin Latest Range: 12.0-15.0 g/dL 8.5 (L) 9.5 (L) 8.9 (L) 8.6 (L) 7.8 (L)  HCT Latest Range: 36.0-46.0 % 25.8 (L) 28.0 (L) 27.6 (L) 25.6 (L) 24.3 (L)    1420:  BNP elevated, no old to compare; will dose IV lasix. BUN/Cr elevated per baseline. Dx and testing d/w pt and family.  Questions answered.  Verb understanding, agreeable to admit.  T/C to [facility] Dr. Anastasio Champion, case discussed, including:  HPI, pertinent PM/SHx, VS/PE, dx testing, ED course and treatment:  Agreeable to admit, requests to write temporary orders, obtain tele bed.   Wannetta Sender  Thurnell Garbe, DO 12/22/13 1744

## 2013-12-21 NOTE — ED Notes (Signed)
RT notified of orders 

## 2013-12-21 NOTE — H&P (Signed)
[facility] History and Physical  Sabrina Beck [id number] DOB: 28-Jan-1941 DOA: 12/21/2013  Referring physician: ER. PCP: Glo Herring., MD   Chief Complaint: Dyspnea.  HPI: Sabrina Beck is a 73 y.o. female  This is a 73 year old lady, who has end-stage renal disease and is awaiting initiation of dialysis, who presents with one-week history of dyspnea on minimal exertion. She denies any chest pain but describes PND, orthopnea. She has had a fairly nonproductive cough. There is no fever. She thinks she may have also had swelling of her lower legs. Evaluation in the emergency room is suggestive of congestive heart failure/fluid overload and she is now being admitted for further management.   Review of Systems:  Constitutional:  No weight loss, night sweats, Fevers, chills, fatigue.  HEENT:  No headaches, Difficulty swallowing,Tooth/dental problems,Sore throat,  No sneezing, itching, ear ache, nasal congestion, post nasal drip,   GI:  No heartburn, indigestion, abdominal pain, nausea, vomiting, diarrhea, change in bowel habits, loss of appetite  Resp:   No excess mucus, no productive cough, No non-productive cough, No coughing up of blood.No change in color of mucus.No wheezing.No chest wall deformity  Skin:  no rash or lesions.  GU:  no dysuria, change in color of urine, no urgency or frequency. No flank pain.  Musculoskeletal:  No joint pain or swelling. No decreased range of motion. No back pain.  Psych:  No change in mood or affect. No depression or anxiety. No memory loss.   Past Medical History  Diagnosis Date  . Anemia 03/27/2011  . Atrial fibrillation   . Murmur 06/04/2006,09/29/10    2D Echo perform both test shows EF 55%  . Chest pain 05/15/2006    stress test perform negative for ischemia  . Systemic hypertension   . Mixed hyperlipidemia   . Cancer     uterine  . Complication of anesthesia   . PONV (postoperative nausea and vomiting)   .  Dysrhythmia     afib  . Renal disorder   . Kidney disease     stage 4 chronic  . GERD (gastroesophageal reflux disease)   . Diverticulosis    Past Surgical History  Procedure Laterality Date  . Cholecystectomy    . Abdominal hysterectomy    . Breast surgery Left     tumors non canerous 2- 3 different surgeries  . Knee arthroscopy    . Av fistula placement Left 11/17/2013    Procedure: CREATION OF LEFT RADIOCEPHALIC ARTERIOVENOUS (AV) FISTULA ;  Surgeon: [name], MD;  Location: [facility];  Service: Vascular;  Laterality: Left;   Social History:  reports that she quit smoking about 14 years ago. She does not have any smokeless tobacco history on file. She reports that she does not drink alcohol or use illicit drugs.  No Known Allergies  Family History  Problem Relation Age of Onset  . Diabetes Mother   . Hypertension Mother   . Diabetes Father   . Heart disease Father     before age 39  . Heart attack Father   . Diabetes Sister   . Hyperlipidemia Sister   . Hypertension Sister   . Cancer Brother   . Diabetes Brother   . Heart disease Brother   . Hypertension Brother   . Heart attack Brother      Prior to Admission medications   Medication Sig Start Date End Date Taking? Authorizing Provider  acetaminophen (TYLENOL) 500 MG tablet Take 1,000 mg by mouth  daily as needed for moderate pain.   Yes Historical Provider, MD  azithromycin (ZITHROMAX) 250 MG tablet Take 1 tablet by mouth daily. 12/18/13  Yes Historical Provider, MD  calcitRIOL (ROCALTROL) 0.25 MCG capsule Take 0.25 mcg by mouth daily.   Yes Historical Provider, MD  diltiazem (CARDIZEM CD) 120 MG 24 hr capsule Take 120 mg by mouth daily.   Yes Historical Provider, MD  Fe Fum-FA-B Cmp-C-Zn-Mg-Mn-Cu (FERROCITE PLUS PO) Take 1 tablet by mouth daily.    Yes Historical Provider, MD  fenofibrate (TRICOR) 145 MG tablet TAKE ONE (1) TABLET BY MOUTH EVERY DAY 11/11/13  Yes Mihai Croitoru, MD  metoprolol tartrate (LOPRESSOR)  25 MG tablet TAKE ONE AND ONE-HALF TABLETS TWO TIMES DAILY. 11/11/13  Yes Mihai Croitoru, MD  Multiple Vitamins-Minerals (VISION-VITE PRESERVE PO) Take 1 tablet by mouth 2 (two) times daily.    Yes Historical Provider, MD  Omega-3 Fatty Acids (FISH OIL) 1200 MG CAPS Take 1 capsule by mouth 2 (two) times daily.   Yes Historical Provider, MD  omeprazole (PRILOSEC) 20 MG capsule Take 20 mg by mouth daily.   Yes Historical Provider, MD  pravastatin (PRAVACHOL) 40 MG tablet Take 1 tablet (40 mg total) by mouth at bedtime. 10/21/13  Yes Mihai Croitoru, MD  sevelamer (RENVELA) 800 MG tablet Take 800 mg by mouth 3 (three) times daily with meals. And patient also takes wit snacks   Yes Historical Provider, MD  sodium bicarbonate 650 MG tablet Take 650 mg by mouth 3 (three) times daily.   Yes Historical Provider, MD  Vitamin D, Ergocalciferol, (DRISDOL) 50000 UNITS CAPS Take 50,000 Units by mouth every 7 (seven) days. Takes on Tuesday.   Yes Historical Provider, MD  warfarin (COUMADIN) 5 MG tablet Take 2.5-5 mg by mouth daily. 1 tablet (5 mg) on sun,tues and thurs and all other days take half tab (2.5 mg)   Yes Historical Provider, MD   Physical Exam: Filed Vitals:   12/21/13 1828  BP: 152/81  Pulse: 108  Temp: 99.2 F (37.3 C)  Resp: 20    BP 152/81  Pulse 108  Temp(Src) 99.2 F (37.3 C) (Oral)  Resp 20  Ht 5' 3.5" (1.613 m)  Wt 97.7 kg (215 lb 6.2 oz)  BMI 37.55 kg/m2  SpO2 95%  General:  Appears calm and comfortable. Appears to have improved subjectively with intravenous Lasix since being in the ER. No peripheral central cyanosis. No increased work of breathing at rest. Eyes: PERRL, normal lids, irises & conjunctiva ENT: grossly normal hearing, lips & tongue Neck: no LAD, masses or thyromegaly Cardiovascular: Heart sounds are present in atrial fibrillation. Jugular venous pressures are not elevated. There is a peripheral pitting edema of her lower legs. Telemetry: SR, no arrhythmias    Respiratory: CTA bilaterally, no w/r/r. Normal respiratory effort. Abdomen: soft, ntnd Skin: no rash or induration seen on limited exam Musculoskeletal: grossly normal tone BUE/BLE Psychiatric: grossly normal mood and affect, speech fluent and appropriate Neurologic: grossly non-focal.          Labs on Admission:  Basic Metabolic Panel:  Recent Labs Lab 12/21/13 1436  NA 142  K 3.4*  CL 107  CO2 20  GLUCOSE 105*  BUN 55*  CREATININE 4.73*  CALCIUM 8.6       CBC:  Recent Labs Lab 12/21/13 1436  WBC 8.6  NEUTROABS 6.0  HGB 7.8*  HCT 24.3*  MCV 100.0  PLT 241   Cardiac Enzymes:  Recent Labs Lab 12/21/13 1436  TROPONINI <0.30    BNP (last 3 results)  Recent Labs  12/21/13 1436  PROBNP 8395.0*   CBG:   Radiological Exams on Admission: Dg Chest 2 View  12/21/2013   CLINICAL DATA:  Shortness of breath.  EXAM: CHEST  2 VIEW  COMPARISON:  November 17, 2013  FINDINGS: The mediastinal contour is normal. The heart size is enlarged. There is central pulmonary edema. There is no pleural effusion or focal pneumonia. The soft tissues and osseous structures are stable.  IMPRESSION: Mild congestive heart failure.   Electronically Signed   By: Abelardo Diesel M.D.   On: 12/21/2013 15:20    EKG: Independently reviewed. Atrial fibrillation with elevated ventricular rate.  Assessment/Plan   1. Congestive heart failure/fluid overload. 2. End-stage chronic kidney disease stage V, awaiting initiation of hemodialysis. 3. Chronic atrial fibrillation on chronic anticoagulation with warfarin. 4. Hypertension. 5. Anemia of chronic kidney disease.  Plan: 1. Admit to telemetry floor. 2. Intravenous Lasix for diuresis. 3. Nephrology consultation. 4. Serial cardiac enzymes and echocardiogram. Consider cardiology consultation depending on results of echocardiogram. 5. Control ventricular rate of the atrial fibrillation.  Other recommendations will depend on patient's  hospital progress.   Code Status: Full code.   Family Communication: I discussed the plan with patient at the bedside.   Disposition Plan: Home when medically stable.   Time spent: 60 minutes.  [facility] Pager [phone].

## 2013-12-21 NOTE — ED Notes (Signed)
Nurse tech ambulated pt, pt remains around 95% on room air while ambulating pt did de-sat to 93% at one point. Pt reports SHOB both at rest and with ambulation

## 2013-12-21 NOTE — Progress Notes (Signed)
ANTICOAGULATION CONSULT NOTE - Initial Consult  Pharmacy Consult for Coumadin Indication: atrial fibrillation  No Known Allergies  Patient Measurements: Height: 5' 3.5" (161.3 cm) Weight: 220 lb (99.791 kg) IBW/kg (Calculated) : 53.55 Heparin Dosing Weight:   Vital Signs: Temp: 98.2 F (36.8 C) (05/17 1400) Temp src: Oral (05/17 1400) BP: 129/71 mmHg (05/17 1717) Pulse Rate: 107 (05/17 1717)  Labs:  Recent Labs  12/21/13 1436 12/21/13 1536  HGB 7.8*  --   HCT 24.3*  --   PLT 241  --   LABPROT  --  27.2*  INR  --  2.63*  CREATININE 4.73*  --   TROPONINI <0.30  --     Estimated Creatinine Clearance: 11.9 ml/min (by C-G formula based on Cr of 4.73).   Medical History: Past Medical History  Diagnosis Date  . Anemia 03/27/2011  . Atrial fibrillation   . Murmur 06/04/2006,09/29/10    2D Echo perform both test shows EF 55%  . Chest pain 05/15/2006    stress test perform negative for ischemia  . Systemic hypertension   . Mixed hyperlipidemia   . Cancer     uterine  . Complication of anesthesia   . PONV (postoperative nausea and vomiting)   . Dysrhythmia     afib  . Renal disorder   . Kidney disease     stage 4 chronic  . GERD (gastroesophageal reflux disease)   . Diverticulosis     Medications:  Scheduled:  . calcitRIOL  0.25 mcg Oral Daily  . diltiazem  120 mg Oral Daily  . fenofibrate  160 mg Oral Daily  . [START ON 12/22/2013] FERROCITE PLUS  1 tablet Oral q morning - 10a  . furosemide  60 mg Intravenous Q12H  . heparin  5,000 Units Subcutaneous 3 times per day  . metoprolol tartrate  25 mg Oral BID  . NON FORMULARY 40 mg  40 mg Oral QPM  . omega-3 acid ethyl esters  1 g Oral BID  . pantoprazole  40 mg Oral Daily  . potassium chloride  40 mEq Oral Once  . [START ON 12/22/2013] sevelamer carbonate  800 mg Oral TID WC  . sodium bicarbonate  650 mg Oral TID  . sodium chloride  3 mL Intravenous Q12H  . Vitamin D (Ergocalciferol)  50,000 Units Oral Q7  days  . warfarin  5 mg Oral Once  . [START ON 12/22/2013] Warfarin - Pharmacist Dosing Inpatient   Does not apply q1800    Assessment: Continuation of Coumadin from home INR therapeutic on admission  Goal of Therapy:  INR 2-3 Monitor platelets by anticoagulation protocol: Yes   Plan:  Coumadin 5 mg po x 1 dose tonight (home regiment) INR/PT daily CBC daily  Dashana Guizar [employer] 12/21/2013,6:15 PM

## 2013-12-22 DIAGNOSIS — I059 Rheumatic mitral valve disease, unspecified: Secondary | ICD-10-CM

## 2013-12-22 DIAGNOSIS — R0609 Other forms of dyspnea: Secondary | ICD-10-CM

## 2013-12-22 DIAGNOSIS — R0989 Other specified symptoms and signs involving the circulatory and respiratory systems: Secondary | ICD-10-CM

## 2013-12-22 LAB — CBC
HCT: 24.6 % — ABNORMAL LOW (ref 36.0–46.0)
Hemoglobin: 7.8 g/dL — ABNORMAL LOW (ref 12.0–15.0)
MCH: 31.6 pg (ref 26.0–34.0)
MCHC: 31.7 g/dL (ref 30.0–36.0)
MCV: 99.6 fL (ref 78.0–100.0)
Platelets: 242 10*3/uL (ref 150–400)
RBC: 2.47 MIL/uL — AB (ref 3.87–5.11)
RDW: 15.4 % (ref 11.5–15.5)
WBC: 9.5 10*3/uL (ref 4.0–10.5)

## 2013-12-22 LAB — COMPREHENSIVE METABOLIC PANEL
ALBUMIN: 3.1 g/dL — AB (ref 3.5–5.2)
ALT: 27 U/L (ref 0–35)
AST: 32 U/L (ref 0–37)
Alkaline Phosphatase: 37 U/L — ABNORMAL LOW (ref 39–117)
BUN: 55 mg/dL — ABNORMAL HIGH (ref 6–23)
CALCIUM: 8.8 mg/dL (ref 8.4–10.5)
CO2: 18 mEq/L — ABNORMAL LOW (ref 19–32)
CREATININE: 4.74 mg/dL — AB (ref 0.50–1.10)
Chloride: 104 mEq/L (ref 96–112)
GFR calc Af Amer: 10 mL/min — ABNORMAL LOW (ref >90)
GFR calc non Af Amer: 8 mL/min — ABNORMAL LOW (ref >90)
Glucose, Bld: 134 mg/dL — ABNORMAL HIGH (ref 70–99)
Potassium: 3.6 mEq/L — ABNORMAL LOW (ref 3.7–5.3)
Sodium: 140 mEq/L (ref 137–147)
TOTAL PROTEIN: 7.2 g/dL (ref 6.0–8.3)
Total Bilirubin: 0.6 mg/dL (ref 0.3–1.2)

## 2013-12-22 LAB — TROPONIN I
Troponin I: <0.3 ng/mL (ref <0.30)
Troponin I: <0.3 ng/mL (ref <0.30)

## 2013-12-22 LAB — PROTIME-INR
INR: 3.03 — ABNORMAL HIGH (ref 0.00–1.49)
Prothrombin Time: 30.3 seconds — ABNORMAL HIGH (ref 11.6–15.2)

## 2013-12-22 LAB — PRO B NATRIURETIC PEPTIDE: PRO B NATRI PEPTIDE: 9327 pg/mL — AB (ref 0–125)

## 2013-12-22 LAB — TSH: TSH: 3.41 u[IU]/mL (ref 0.350–4.500)

## 2013-12-22 MED ORDER — DARBEPOETIN ALFA-POLYSORBATE 100 MCG/0.5ML IJ SOLN
100.0000 ug | INTRAMUSCULAR | Status: DC
  Administered 2013-12-22: 100 ug via SUBCUTANEOUS
  Filled 2013-12-22 (×2): qty 0.5

## 2013-12-22 MED ORDER — FE FUMARATE-B12-VIT C-FA-IFC PO CAPS
1.0000 | ORAL_CAPSULE | Freq: Every day | ORAL | Status: DC
  Administered 2013-12-22 – 2013-12-25 (×4): 1 via ORAL
  Filled 2013-12-22 (×4): qty 1

## 2013-12-22 MED ORDER — FUROSEMIDE 10 MG/ML IJ SOLN
80.0000 mg | Freq: Two times a day (BID) | INTRAMUSCULAR | Status: DC
Start: 2013-12-22 — End: 2013-12-24
  Administered 2013-12-22 – 2013-12-24 (×3): 80 mg via INTRAVENOUS
  Filled 2013-12-22 (×4): qty 8

## 2013-12-22 MED ORDER — WARFARIN SODIUM 1 MG PO TABS
1.0000 mg | ORAL_TABLET | Freq: Once | ORAL | Status: AC
  Administered 2013-12-22: 1 mg via ORAL
  Filled 2013-12-22: qty 1

## 2013-12-22 MED ORDER — SODIUM BICARBONATE 650 MG PO TABS
1300.0000 mg | ORAL_TABLET | Freq: Two times a day (BID) | ORAL | Status: DC
  Administered 2013-12-22 – 2013-12-25 (×7): 1300 mg via ORAL
  Filled 2013-12-22 (×7): qty 2

## 2013-12-22 NOTE — Progress Notes (Signed)
*  PRELIMINARY RESULTS* Echocardiogram 2D Echocardiogram has been performed.  Sabrina Beck 12/22/2013, 2:42 PM

## 2013-12-22 NOTE — Progress Notes (Signed)
Utilization Review Complete  

## 2013-12-22 NOTE — Care Management Note (Signed)
    Page 1 of 1   12/22/2013     12:41:58 PM CARE MANAGEMENT NOTE 12/22/2013  Patient:  Sabrina Beck, Sabrina Beck   Account Number:  [id number]  Date Initiated:  12/22/2013  Documentation initiated by:  Claretha Cooper  Subjective/Objective Assessment:   Pt admitted from home where she ives with her son. No needs anticipated at this time.     Action/Plan:   Anticipated DC Date:  12/24/2013   Anticipated DC Plan:  [place]  CM consult      Choice offered to / List presented to:             Status of service:  In process, will continue to follow Medicare Important Message given?   (If response is "NO", the following Medicare IM given date fields will be blank) Date Medicare IM given:   Date Additional Medicare IM given:    Discharge Disposition:    Per UR Regulation:    If discussed at Long Length of Stay Meetings, dates discussed:    Comments:  12/22/13 Claretha Cooper RN BSN CM

## 2013-12-22 NOTE — Progress Notes (Signed)
[facility] for Coumadin Indication: atrial fibrillation  No Known Allergies  Patient Measurements: Height: 5' 3.5" (161.3 cm) Weight: 217 lb 6 oz (98.6 kg) IBW/kg (Calculated) : 53.55  Vital Signs: Temp: 98.3 F (36.8 C) (05/18 0805) Temp src: Oral (05/18 0805) BP: 143/87 mmHg (05/18 0805) Pulse Rate: 121 (05/18 0805)  Labs:  Recent Labs  12/21/13 1436 12/21/13 1536 12/21/13 2004 12/22/13 0235 12/22/13 0749  HGB 7.8*  --   --  7.8*  --   HCT 24.3*  --   --  24.6*  --   PLT 241  --   --  242  --   LABPROT  --  27.2*  --  30.3*  --   INR  --  2.63*  --  3.03*  --   CREATININE 4.73*  --   --  4.74*  --   TROPONINI <0.30  --  <0.30 <0.30 <0.30    Estimated Creatinine Clearance: 11.8 ml/min (by C-G formula based on Cr of 4.74).   Medical History: Past Medical History  Diagnosis Date  . Anemia 03/27/2011  . Atrial fibrillation   . Murmur 06/04/2006,09/29/10    2D Echo perform both test shows EF 55%  . Chest pain 05/15/2006    stress test perform negative for ischemia  . Systemic hypertension   . Mixed hyperlipidemia   . Cancer     uterine  . Complication of anesthesia   . PONV (postoperative nausea and vomiting)   . Dysrhythmia     afib  . Renal disorder   . Kidney disease     stage 4 chronic  . GERD (gastroesophageal reflux disease)   . Diverticulosis     Medications:  Scheduled:  . calcitRIOL  0.25 mcg Oral Daily  . darbepoetin  100 mcg Subcutaneous Q Mon-1800  . diltiazem  120 mg Oral Daily  . fenofibrate  160 mg Oral Daily  . ferrous [id number] C-folic acid  1 capsule Oral Q breakfast  . furosemide  80 mg Intravenous Q12H  . metoprolol tartrate  25 mg Oral BID  . omega-3 acid ethyl esters  1 g Oral BID  . pantoprazole  40 mg Oral Daily  . pravastatin  40 mg Oral q1800  . sevelamer carbonate  800 mg Oral TID WC  . sodium bicarbonate  1,300 mg Oral BID  . sodium chloride  3 mL Intravenous Q12H  .  [START ON 12/23/2013] Vitamin D (Ergocalciferol)  50,000 Units Oral Q7 days  . Warfarin - Pharmacist Dosing Inpatient   Does not apply q1800   Medications Prior to Admission  Medication Sig Dispense Refill  . acetaminophen (TYLENOL) 500 MG tablet Take 1,000 mg by mouth daily as needed for moderate pain.      Marland Kitchen azithromycin (ZITHROMAX) 250 MG tablet Take 1 tablet by mouth daily.      . calcitRIOL (ROCALTROL) 0.25 MCG capsule Take 0.25 mcg by mouth daily.      Marland Kitchen diltiazem (CARDIZEM CD) 120 MG 24 hr capsule Take 120 mg by mouth daily.      . Fe Fum-FA-B Cmp-C-Zn-Mg-Mn-Cu (FERROCITE PLUS PO) Take 1 tablet by mouth daily.       . fenofibrate (TRICOR) 145 MG tablet TAKE ONE (1) TABLET BY MOUTH EVERY DAY  30 tablet  11  . metoprolol tartrate (LOPRESSOR) 25 MG tablet TAKE ONE AND ONE-HALF TABLETS TWO TIMES DAILY.  90 tablet  11  . Multiple Vitamins-Minerals (VISION-VITE PRESERVE PO) Take 1  tablet by mouth 2 (two) times daily.       . Omega-3 Fatty Acids (FISH OIL) 1200 MG CAPS Take 1 capsule by mouth 2 (two) times daily.      Marland Kitchen omeprazole (PRILOSEC) 20 MG capsule Take 20 mg by mouth daily.      . pravastatin (PRAVACHOL) 40 MG tablet Take 1 tablet (40 mg total) by mouth at bedtime.  90 tablet  3  . sevelamer (RENVELA) 800 MG tablet Take 800 mg by mouth 3 (three) times daily with meals. And patient also takes wit snacks      . sodium bicarbonate 650 MG tablet Take 650 mg by mouth 3 (three) times daily.      . Vitamin D, Ergocalciferol, (DRISDOL) 50000 UNITS CAPS Take 50,000 Units by mouth every 7 (seven) days. Takes on Tuesday.      . warfarin (COUMADIN) 5 MG tablet Take 2.5-5 mg by mouth daily. 1 tablet (5 mg) on sun,tues and thurs and all other days take half tab (2.5 mg)        Assessment: 73 yo F on chronic warfarin for Afib.  Home dose listed above.  INR therapeutic on admission, however at upper end of goal range today after 5mg  dose (per home regimen) last night.  No bleeding noted.   Goal of  Therapy:  INR 2-3   Plan:  Coumadin 1 mg po x 1 dose tonight  INR daily  Lavonia Drafts Atwood Adcock 12/22/2013,9:04 AM

## 2013-12-22 NOTE — Consult Note (Addendum)
Reason for Consult: Chronic kidney disease Referring Physician: Dr.Regalado  Sabrina Beck is an 73 y.o. female.  HPI: She patient who has history of fibrillation, hypertension and chronic kidney disease stage IV presently came with exertional dyspnea. According the patient she has difficulty in breathing whenever she was walking or whenever she has some activity. Patient denies any chest pain. She denies also any orthopenia or paroxysmal nocturnal dyspnea worst than before. Her appetite is not that great but she denies any nausea vomiting. This has been going on for some time.  Past Medical History  Diagnosis Date  . Anemia 03/27/2011  . Atrial fibrillation   . Murmur 06/04/2006,09/29/10    2D Echo perform both test shows EF 55%  . Chest pain 05/15/2006    stress test perform negative for ischemia  . Systemic hypertension   . Mixed hyperlipidemia   . Cancer     uterine  . Complication of anesthesia   . PONV (postoperative nausea and vomiting)   . Dysrhythmia     afib  . Renal disorder   . Kidney disease     stage 4 chronic  . GERD (gastroesophageal reflux disease)   . Diverticulosis     Past Surgical History  Procedure Laterality Date  . Cholecystectomy    . Abdominal hysterectomy    . Breast surgery Left     tumors non canerous 2- 3 different surgeries  . Knee arthroscopy    . Av fistula placement Left 11/17/2013    Procedure: CREATION OF LEFT RADIOCEPHALIC ARTERIOVENOUS (AV) FISTULA ;  Surgeon: [name], MD;  Location: [facility] OR;  Service: Vascular;  Laterality: Left;    Family History  Problem Relation Age of Onset  . Diabetes Mother   . Hypertension Mother   . Diabetes Father   . Heart disease Father     before age 82  . Heart attack Father   . Diabetes Sister   . Hyperlipidemia Sister   . Hypertension Sister   . Cancer Brother   . Diabetes Brother   . Heart disease Brother   . Hypertension Brother   . Heart attack Brother     Social History:  reports  that she quit smoking about 14 years ago. She does not have any smokeless tobacco history on file. She reports that she does not drink alcohol or use illicit drugs.  Allergies: No Known Allergies  Medications: I have reviewed the patient's current medications.  Results for orders placed during the hospital encounter of 12/21/13 (from the past 48 hour(s))  CBC WITH DIFFERENTIAL     Status: Abnormal   Collection Time    12/21/13  2:36 PM      Result Value Ref Range   WBC 8.6  4.0 - 10.5 K/uL   RBC 2.43 (*) 3.87 - 5.11 MIL/uL   Hemoglobin 7.8 (*) 12.0 - 15.0 g/dL   HCT 24.3 (*) 36.0 - 46.0 %   MCV 100.0  78.0 - 100.0 fL   MCH 32.1  26.0 - 34.0 pg   MCHC 32.1  30.0 - 36.0 g/dL   RDW 15.5  11.5 - 15.5 %   Platelets 241  150 - 400 K/uL   Neutrophils Relative % 71  43 - 77 %   Neutro Abs 6.0  1.7 - 7.7 K/uL   Lymphocytes Relative 20  12 - 46 %   Lymphs Abs 1.7  0.7 - 4.0 K/uL   Monocytes Relative 8  3 - 12 %  Monocytes Absolute 0.7  0.1 - 1.0 K/uL   Eosinophils Relative 1  0 - 5 %   Eosinophils Absolute 0.1  0.0 - 0.7 K/uL   Basophils Relative 0  0 - 1 %   Basophils Absolute 0.0  0.0 - 0.1 K/uL  BASIC METABOLIC PANEL     Status: Abnormal   Collection Time    12/21/13  2:36 PM      Result Value Ref Range   Sodium 142  137 - 147 mEq/L   Potassium 3.4 (*) 3.7 - 5.3 mEq/L   Chloride 107  96 - 112 mEq/L   CO2 20  19 - 32 mEq/L   Glucose, Bld 105 (*) 70 - 99 mg/dL   BUN 55 (*) 6 - 23 mg/dL   Creatinine, Ser 4.73 (*) 0.50 - 1.10 mg/dL   Calcium 8.6  8.4 - 10.5 mg/dL   GFR calc non Af Amer 8 (*) >90 mL/min   GFR calc Af Amer 10 (*) >90 mL/min   Comment: (NOTE)     The eGFR has been calculated using the CKD EPI equation.     This calculation has not been validated in all clinical situations.     eGFR's persistently <90 mL/min signify possible Chronic Kidney     Disease.  TROPONIN I     Status: None   Collection Time    12/21/13  2:36 PM      Result Value Ref Range   Troponin I  <0.30  <0.30 ng/mL   Comment:            Due to the release kinetics of cTnI,     a negative result within the first hours     of the onset of symptoms does not rule out     myocardial infarction with certainty.     If myocardial infarction is still suspected,     repeat the test at appropriate intervals.  PRO B NATRIURETIC PEPTIDE     Status: Abnormal   Collection Time    12/21/13  2:36 PM      Result Value Ref Range   Pro B Natriuretic peptide (BNP) 8395.0 (*) 0 - 125 pg/mL  SAMPLE TO BLOOD BANK     Status: None   Collection Time    12/21/13  3:22 PM      Result Value Ref Range   Blood Bank Specimen SAMPLE AVAILABLE FOR TESTING     Sample Expiration 12/24/2013    PROTIME-INR     Status: Abnormal   Collection Time    12/21/13  3:36 PM      Result Value Ref Range   Prothrombin Time 27.2 (*) 11.6 - 15.2 seconds   INR 2.63 (*) 0.00 - 1.49  TROPONIN I     Status: None   Collection Time    12/21/13  8:04 PM      Result Value Ref Range   Troponin I <0.30  <0.30 ng/mL   Comment:            Due to the release kinetics of cTnI,     a negative result within the first hours     of the onset of symptoms does not rule out     myocardial infarction with certainty.     If myocardial infarction is still suspected,     repeat the test at appropriate intervals.  COMPREHENSIVE METABOLIC PANEL     Status: Abnormal   Collection Time    12/22/13  2:35 AM      Result Value Ref Range   Sodium 140  137 - 147 mEq/L   Potassium 3.6 (*) 3.7 - 5.3 mEq/L   Chloride 104  96 - 112 mEq/L   CO2 18 (*) 19 - 32 mEq/L   Glucose, Bld 134 (*) 70 - 99 mg/dL   BUN 55 (*) 6 - 23 mg/dL   Creatinine, Ser 4.74 (*) 0.50 - 1.10 mg/dL   Calcium 8.8  8.4 - 10.5 mg/dL   Total Protein 7.2  6.0 - 8.3 g/dL   Albumin 3.1 (*) 3.5 - 5.2 g/dL   AST 32  0 - 37 U/L   ALT 27  0 - 35 U/L   Alkaline Phosphatase 37 (*) 39 - 117 U/L   Total Bilirubin 0.6  0.3 - 1.2 mg/dL   GFR calc non Af Amer 8 (*) >90 mL/min   GFR calc  Af Amer 10 (*) >90 mL/min   Comment: (NOTE)     The eGFR has been calculated using the CKD EPI equation.     This calculation has not been validated in all clinical situations.     eGFR's persistently <90 mL/min signify possible Chronic Kidney     Disease.  CBC     Status: Abnormal   Collection Time    12/22/13  2:35 AM      Result Value Ref Range   WBC 9.5  4.0 - 10.5 K/uL   RBC 2.47 (*) 3.87 - 5.11 MIL/uL   Hemoglobin 7.8 (*) 12.0 - 15.0 g/dL   HCT 24.6 (*) 36.0 - 46.0 %   MCV 99.6  78.0 - 100.0 fL   MCH 31.6  26.0 - 34.0 pg   MCHC 31.7  30.0 - 36.0 g/dL   RDW 15.4  11.5 - 15.5 %   Platelets 242  150 - 400 K/uL  TROPONIN I     Status: None   Collection Time    12/22/13  2:35 AM      Result Value Ref Range   Troponin I <0.30  <0.30 ng/mL   Comment:            Due to the release kinetics of cTnI,     a negative result within the first hours     of the onset of symptoms does not rule out     myocardial infarction with certainty.     If myocardial infarction is still suspected,     repeat the test at appropriate intervals.  PRO B NATRIURETIC PEPTIDE     Status: Abnormal   Collection Time    12/22/13  2:35 AM      Result Value Ref Range   Pro B Natriuretic peptide (BNP) 9327.0 (*) 0 - 125 pg/mL  PROTIME-INR     Status: Abnormal   Collection Time    12/22/13  2:35 AM      Result Value Ref Range   Prothrombin Time 30.3 (*) 11.6 - 15.2 seconds   INR 3.03 (*) 0.00 - 1.49    Dg Chest 2 View  12/21/2013   CLINICAL DATA:  Shortness of breath.  EXAM: CHEST  2 VIEW  COMPARISON:  November 17, 2013  FINDINGS: The mediastinal contour is normal. The heart size is enlarged. There is central pulmonary edema. There is no pleural effusion or focal pneumonia. The soft tissues and osseous structures are stable.  IMPRESSION: Mild congestive heart failure.   Electronically Signed   By: Abelardo Diesel  M.D.   On: 12/21/2013 15:20    Review of Systems  Constitutional: Positive for malaise/fatigue.   Respiratory: Negative for shortness of breath.   Cardiovascular: Negative for chest pain, orthopnea, leg swelling and PND.  Gastrointestinal: Negative for nausea and vomiting.  Neurological: Positive for weakness.   Blood pressure 131/86, pulse 105, temperature 98.6 F (37 C), temperature source Oral, resp. rate 20, height 5' 3.5" (1.613 m), weight 98.6 kg (217 lb 6 oz), SpO2 95.00%. Physical Exam  Constitutional: She is oriented to person, place, and time.  Eyes: No scleral icterus.  Neck: No JVD present.  Cardiovascular: Normal rate and regular rhythm.   No murmur heard. Respiratory: No respiratory distress. She has no wheezes. She has no rales.  GI: There is no rebound.  Musculoskeletal: She exhibits no edema.  Neurological: She is alert and oriented to person, place, and time.    Assessment/Plan: Problem #1 exertional dyspnea: Etiology as this moment is not clear. Most likely related to her anemia. Patient doesn't have any significant sign of fluid overload. Problem #2 chronic renal failure: Stage IV. Patient does not have any significant sign of uremia. She has a left arm fistula which is maturing. Her potassium is normal. Problem #3 anemia: To be secondary to chronic renal failure. Presently her hemoglobin and hematocrit is declining. Problem #4 hypertension: Her blood pressure is reasonably controlled Problem #5 arterial fibrillation: Her heart rate is controlled Problem #6 history of hyperlipidemia Problem #7 acid-base balance: Her CO2 is low. Patient is on sodium bicarbonate. Problem #8 metabolic bone disease: Calcium is within range but presently workup phosphorus level. Patient on a binder. Plan: We'll increase her Lasix to 80 mg IV twice a day We'll use ARANESP 100 mic subcutaneous every  week. I have discussed with her the side effects of Epogen including worsening of cancer as patient has previous history of uterine cancer/CVA/hypertension. Presently patient is aware of  the side effect of Epogen rather than getting blood transfusion. We'll check her basic metabolic panel, CBC in the morning.  Gevena Cotton Talik Casique 12/22/2013, 7:42 AM

## 2013-12-22 NOTE — Progress Notes (Signed)
[facility] PROGRESS NOTE  Sabrina Beck [id number] DOB: [birth year] DOA: 12/21/2013 PCP: Glo Herring., MD  Assessment/Plan: Congestive heart failure/fluid overload.   -Continue diuresis with IV Lasix -increase to ED twice a day per renal  -Continue monitoring strict I and os and daily weights -Appreciate renal assistance  -Continue metoprolol  -Await 2-D echo results, troponins negative x3.  chronic kidney disease stage IV, -Per renal she has no indications for emergent dialysis at this time. She has a left arm fistula which is maturing - Dr Lowanda Foster assisting with diuresis, follow Chronic atrial fibrillation  -Continue metoprolol and Cardizem -Continue chronic anticoagulation with warfarin.  Hypertension.  -Okay control on outpatient meds. Anemia of chronic kidney disease -Hemoglobin stable, on aranesp per renal  Code Status: Full Family Communication: None at bedside Disposition Plan: To home when medically ready   Consultants:  Renal   Procedures:  Echocardiogram-pending  Antibiotics:  None  HPI/Subjective: Still with shortness of breath with minimal activity but overall breathing better, denies chest pain. Reports decreased leg swelling.  Objective: Filed Vitals:   12/22/13 0805  BP: 143/87  Pulse: 121  Temp: 98.3 F (36.8 C)  Resp: 20   No intake or output data in the 24 hours ending 12/22/13 1426 Filed Weights   12/21/13 1400 12/21/13 1828 12/22/13 0508  Weight: 99.791 kg (220 lb) 97.7 kg (215 lb 6.2 oz) 98.6 kg (217 lb 6 oz)    Exam:  General: alert & oriented x 3 In NAD Cardiovascular: RRR, nl S1 s2 Respiratory: Decreased breath sounds at the bases, no crackles and no wheezes. Abdomen: soft +BS NT/ND, no masses palpable Extremities: No cyanosis and no edema he urine is to be gas the arm without of him on outpatient arm    Data Reviewed: Basic Metabolic Panel:  Recent Labs Lab 12/21/13 1436 12/22/13 0235  NA 142 140  K  3.4* 3.6*  CL 107 104  CO2 20 18*  GLUCOSE 105* 134*  BUN 55* 55*  CREATININE 4.73* 4.74*  CALCIUM 8.6 8.8   Liver Function Tests:  Recent Labs Lab 12/22/13 0235  AST 32  ALT 27  ALKPHOS 37*  BILITOT 0.6  PROT 7.2  ALBUMIN 3.1*   No results found for this basename: LIPASE, AMYLASE,  in the last 168 hours No results found for this basename: AMMONIA,  in the last 168 hours CBC:  Recent Labs Lab 12/21/13 1436 12/22/13 0235  WBC 8.6 9.5  NEUTROABS 6.0  --   HGB 7.8* 7.8*  HCT 24.3* 24.6*  MCV 100.0 99.6  PLT 241 242   Cardiac Enzymes:  Recent Labs Lab 12/21/13 1436 12/21/13 2004 12/22/13 0235 12/22/13 0749  TROPONINI <0.30 <0.30 <0.30 <0.30   BNP (last 3 results)  Recent Labs  12/21/13 1436 12/22/13 0235  PROBNP 8395.0* 9327.0*   CBG: No results found for this basename: GLUCAP,  in the last 168 hours  No results found for this or any previous visit (from the past 240 hour(s)).   Studies: Dg Chest 2 View  12/21/2013   CLINICAL DATA:  Shortness of breath.  EXAM: CHEST  2 VIEW  COMPARISON:  November 17, 2013  FINDINGS: The mediastinal contour is normal. The heart size is enlarged. There is central pulmonary edema. There is no pleural effusion or focal pneumonia. The soft tissues and osseous structures are stable.  IMPRESSION: Mild congestive heart failure.   Electronically Signed   By: Abelardo Diesel M.D.   On: 12/21/2013 15:20  Scheduled Meds: . calcitRIOL  0.25 mcg Oral Daily  . darbepoetin  100 mcg Subcutaneous Q Mon-1800  . diltiazem  120 mg Oral Daily  . fenofibrate  160 mg Oral Daily  . ferrous Q000111Q C-folic acid  1 capsule Oral Q breakfast  . furosemide  80 mg Intravenous Q12H  . metoprolol tartrate  25 mg Oral BID  . omega-3 acid ethyl esters  1 g Oral BID  . pantoprazole  40 mg Oral Daily  . pravastatin  40 mg Oral q1800  . sevelamer carbonate  800 mg Oral TID WC  . sodium bicarbonate  1,300 mg Oral BID  . sodium chloride  3  mL Intravenous Q12H  . [START ON 12/23/2013] Vitamin D (Ergocalciferol)  50,000 Units Oral Q7 days  . warfarin  1 mg Oral Once  . Warfarin - Pharmacist Dosing Inpatient   Does not apply q1800   Continuous Infusions:   Active Problems:   Anemia   Atrial fibrillation   Long term (current) use of anticoagulants   CKD (chronic kidney disease) stage 5, GFR less than 15 ml/min   HTN (hypertension)   Dyspnea   CHF (congestive heart failure)    Time spent: [facility] Pager [phone]. If 7PM-7AM, please contact night-coverage at [email or web address], password [facility] 12/22/2013, 2:26 PM  LOS: 1 day

## 2013-12-22 NOTE — Progress Notes (Signed)
Patient's heart rate 170-180's on the telemetry monitor,no c/o chest pain,but patient does have exertional dyspnea,B/P 143/87, temp 98.3,resp,20,patient out of bed to chair.Dr Dillard Essex notified. Will continue to monitor patient.

## 2013-12-23 DIAGNOSIS — N289 Disorder of kidney and ureter, unspecified: Secondary | ICD-10-CM

## 2013-12-23 LAB — CBC
HCT: 26.7 % — ABNORMAL LOW (ref 36.0–46.0)
Hemoglobin: 8.6 g/dL — ABNORMAL LOW (ref 12.0–15.0)
MCH: 31.7 pg (ref 26.0–34.0)
MCHC: 32.2 g/dL (ref 30.0–36.0)
MCV: 98.5 fL (ref 78.0–100.0)
PLATELETS: 302 10*3/uL (ref 150–400)
RBC: 2.71 MIL/uL — AB (ref 3.87–5.11)
RDW: 15.1 % (ref 11.5–15.5)
WBC: 9.3 10*3/uL (ref 4.0–10.5)

## 2013-12-23 LAB — BASIC METABOLIC PANEL
BUN: 72 mg/dL — ABNORMAL HIGH (ref 6–23)
CHLORIDE: 101 meq/L (ref 96–112)
CO2: 19 meq/L (ref 19–32)
Calcium: 9.2 mg/dL (ref 8.4–10.5)
Creatinine, Ser: 5.42 mg/dL — ABNORMAL HIGH (ref 0.50–1.10)
GFR calc Af Amer: 8 mL/min — ABNORMAL LOW (ref >90)
GFR calc non Af Amer: 7 mL/min — ABNORMAL LOW (ref >90)
Glucose, Bld: 181 mg/dL — ABNORMAL HIGH (ref 70–99)
POTASSIUM: 3.1 meq/L — AB (ref 3.7–5.3)
SODIUM: 142 meq/L (ref 137–147)

## 2013-12-23 LAB — PROTIME-INR
INR: 3.4 — ABNORMAL HIGH (ref 0.00–1.49)
PROTHROMBIN TIME: 33.1 s — AB (ref 11.6–15.2)

## 2013-12-23 MED ORDER — METOPROLOL TARTRATE 25 MG PO TABS
37.5000 mg | ORAL_TABLET | Freq: Two times a day (BID) | ORAL | Status: DC
  Administered 2013-12-23 – 2013-12-24 (×2): 37.5 mg via ORAL
  Filled 2013-12-23 (×2): qty 2

## 2013-12-23 MED ORDER — LIVING BETTER WITH HEART FAILURE BOOK
Freq: Once | Status: AC
  Administered 2013-12-23: 1
  Filled 2013-12-23: qty 1

## 2013-12-23 MED ORDER — METOPROLOL TARTRATE 25 MG PO TABS
12.5000 mg | ORAL_TABLET | Freq: Once | ORAL | Status: AC
  Administered 2013-12-23: 12.5 mg via ORAL
  Filled 2013-12-23: qty 1

## 2013-12-23 NOTE — Progress Notes (Signed)
CHF education done. Patient verbalized understanding. Up to chair most of day today. No complaints voiced.

## 2013-12-23 NOTE — Progress Notes (Signed)
[place] for Coumadin Indication: atrial fibrillation  No Known Allergies  Patient Measurements: Height: 5' 3.5" (161.3 cm) Weight: 211 lb 3.2 oz (95.8 kg) IBW/kg (Calculated) : 53.55  Vital Signs: Temp: 97.8 F (36.6 C) (05/19 0520) Temp src: Oral (05/19 0520) BP: 121/77 mmHg (05/19 0520) Pulse Rate: 120 (05/19 0842)  Labs:  Recent Labs  12/21/13 1436 12/21/13 1536 12/21/13 2004 12/22/13 0235 12/22/13 0749 12/23/13 0552  HGB 7.8*  --   --  7.8*  --  8.6*  HCT 24.3*  --   --  24.6*  --  26.7*  PLT 241  --   --  242  --  302  LABPROT  --  27.2*  --  30.3*  --  33.1*  INR  --  2.63*  --  3.03*  --  3.40*  CREATININE 4.73*  --   --  4.74*  --  5.42*  TROPONINI <0.30  --  <0.30 <0.30 <0.30  --     Estimated Creatinine Clearance: 10.1 ml/min (by C-G formula based on Cr of 5.42).   Medical History: Past Medical History  Diagnosis Date  . Anemia 03/27/2011  . Atrial fibrillation   . Murmur 06/04/2006,09/29/10    2D Echo perform both test shows EF 55%  . Chest pain 05/15/2006    stress test perform negative for ischemia  . Systemic hypertension   . Mixed hyperlipidemia   . Cancer     uterine  . Complication of anesthesia   . PONV (postoperative nausea and vomiting)   . Dysrhythmia     afib  . Renal disorder   . Kidney disease     stage 4 chronic  . GERD (gastroesophageal reflux disease)   . Diverticulosis     Medications:  Scheduled:  . calcitRIOL  0.25 mcg Oral Daily  . darbepoetin  100 mcg Subcutaneous Q Mon-1800  . diltiazem  120 mg Oral Daily  . fenofibrate  160 mg Oral Daily  . ferrous Q000111Q C-folic acid  1 capsule Oral Q breakfast  . furosemide  80 mg Intravenous Q12H  . Living Better with Heart Failure Book   Does not apply Once  . metoprolol tartrate  25 mg Oral BID  . omega-3 acid ethyl esters  1 g Oral BID  . pantoprazole  40 mg Oral Daily  . pravastatin  40 mg Oral q1800  . sevelamer  carbonate  800 mg Oral TID WC  . sodium bicarbonate  1,300 mg Oral BID  . sodium chloride  3 mL Intravenous Q12H  . Vitamin D (Ergocalciferol)  50,000 Units Oral Q7 days  . Warfarin - Pharmacist Dosing Inpatient   Does not apply q1800   Medications Prior to Admission  Medication Sig Dispense Refill  . acetaminophen (TYLENOL) 500 MG tablet Take 1,000 mg by mouth daily as needed for moderate pain.      Marland Kitchen azithromycin (ZITHROMAX) 250 MG tablet Take 1 tablet by mouth daily.      . calcitRIOL (ROCALTROL) 0.25 MCG capsule Take 0.25 mcg by mouth daily.      Marland Kitchen diltiazem (CARDIZEM CD) 120 MG 24 hr capsule Take 120 mg by mouth daily.      . Fe Fum-FA-B Cmp-C-Zn-Mg-Mn-Cu (FERROCITE PLUS PO) Take 1 tablet by mouth daily.       . fenofibrate (TRICOR) 145 MG tablet TAKE ONE (1) TABLET BY MOUTH EVERY DAY  30 tablet  11  . metoprolol tartrate (LOPRESSOR) 25 MG tablet  TAKE ONE AND ONE-HALF TABLETS TWO TIMES DAILY.  90 tablet  11  . Multiple Vitamins-Minerals (VISION-VITE PRESERVE PO) Take 1 tablet by mouth 2 (two) times daily.       . Omega-3 Fatty Acids (FISH OIL) 1200 MG CAPS Take 1 capsule by mouth 2 (two) times daily.      Marland Kitchen omeprazole (PRILOSEC) 20 MG capsule Take 20 mg by mouth daily.      . pravastatin (PRAVACHOL) 40 MG tablet Take 1 tablet (40 mg total) by mouth at bedtime.  90 tablet  3  . sevelamer (RENVELA) 800 MG tablet Take 800 mg by mouth 3 (three) times daily with meals. And patient also takes wit snacks      . sodium bicarbonate 650 MG tablet Take 650 mg by mouth 3 (three) times daily.      . Vitamin D, Ergocalciferol, (DRISDOL) 50000 UNITS CAPS Take 50,000 Units by mouth every 7 (seven) days. Takes on Tuesday.      . warfarin (COUMADIN) 5 MG tablet Take 2.5-5 mg by mouth daily. 1 tablet (5 mg) on sun,tues and thurs and all other days take half tab (2.5 mg)        Assessment: 73 yo F on chronic warfarin for Afib.  Home dose listed above.  INR therapeutic on admission, however has trended up  to supratherapeutic level today.  No bleeding noted.   Goal of Therapy:  INR 2-3   Plan:  HOLD coumadin today INR daily  Damien Batty A Keeana Pieratt 12/23/2013,10:37 AM

## 2013-12-23 NOTE — Clinical Documentation Improvement (Signed)
Please specify TYPE and ACUITY of CHF  Possible Clinical Conditions? Chronic Systolic Congestive Heart Failure Chronic Diastolic Congestive Heart Failure Chronic Systolic & Diastolic Congestive Heart Failure Acute Systolic Congestive Heart Failure Acute Diastolic Congestive Heart Failure Acute Systolic & Diastolic Congestive Heart Failure Acute on Chronic Systolic Congestive Heart Failure Acute on Chronic Diastolic Congestive Heart Failure Acute on Chronic Systolic & Diastolic Congestive Heart Failure Other Condition Cannot Clinically Determine  Supporting Information:(As per notes)"Congestive heart failure/fluid overload. -Continue diuresis with IV Lasix -increase to ED twice a day per renal  -Continue monitoring strict I and os and daily weights  -Appreciate renal assistance  -Continue metoprolol  -Await 2-D echo results, troponins negative x3."   Thank You, Alessandra Grout, RN, BSN, CCDS, Clinical Documentation Specialist:  [phone]   [phone]=[employer] [place]- Health Information Management

## 2013-12-23 NOTE — Progress Notes (Signed)
[facility] PROGRESS NOTE  Sabrina Beck [id number] DOB: February 24, 1941 DOA: 12/21/2013 PCP: Glo Herring., MD  Assessment/Plan: Acute on chronic diastolic Congestive heart failure/fluid overload.   -On IV Lasix IV , creatinine trending up today>> renal dosing her Lasix, follow -Continue monitoring strict I and os and daily weights>> close to 2 L out over past 24 hours -Appreciate renal assistance  -Continue metoprolol  - 2-D echo shows EF 50-55% with Mild hypokinesis of the mid inferoseptal and apical septal myocardium  and troponins negative x3.  chronic kidney disease stage IV, -Per renal she has no indications for emergent dialysis at this time. She has a left arm fistula which is maturing - Creatinine trending up he Dr Lowanda Foster assisting with diuresis, follow Chronic atrial fibrillation  -Tachycardic with minimal exertion>> will increase metoprolol, and monitor for blood pressure tolerance -Continue Cardizem -Continue chronic anticoagulation with warfarin. - Follow and consider cardiology as clinically appropriate>> she is followed by Dr.Croitoru Hypertension.  -Okay control on outpatient meds. Anemia of chronic kidney disease -Hemoglobin stable, on aranesp per renal  Code Status: Full Family Communication: None at bedside Disposition Plan: To home when medically ready   Consultants:  Renal   Procedures:  Echocardiogram Study Conclusions  - Procedure narrative: Transthoracic echocardiography. Image quality was suboptimal due to poor sound transmission. - Left ventricle: The cavity size was normal. Wall thickness was increased in a pattern of moderate LVH. Systolic function was normal. The estimated ejection fraction was in the range of 50% to 55%. The study was not technically sufficient to allow evaluation of LV diastolic dysfunction due to atrial fibrillation. - Regional wall motion abnormality: Mild hypokinesis of the mid inferoseptal and apical  septal myocardium. - Aortic valve: Mildly calcified annulus. - Mitral valve: Mildly calcified annulus. Mildly thickened leaflets . There was mild to moderate regurgitation. - Left atrium: The atrium was moderately dilated. - Tricuspid valve: There was mild regurgitation. Mildly elevated pulmonary pressures (RVSP 38 mmHg).   Antibiotics:  None  HPI/Subjective: Still states breathing improving, per nursing tachycardia to 170 this a.m. with minimal exertion-nonsustaineds.he denies chest pain . Objective: Filed Vitals:   12/23/13 0842  BP:   Pulse: 120  Temp:   Resp:     Intake/Output Summary (Last 24 hours) at 12/23/13 0924 Last data filed at 12/23/13 0746  Gross per 24 hour  Intake    480 ml  Output   2350 ml  Net  -1870 ml   Filed Weights   12/21/13 1828 12/22/13 0508 12/22/13 1707  Weight: 97.7 kg (215 lb 6.2 oz) 98.6 kg (217 lb 6 oz) 95.8 kg (211 lb 3.2 oz)    Exam:  General: alert & oriented x 3 In NAD Cardiovascular: RRR, nl S1 s2 Respiratory: Decreased breath sounds at the bases, no crackles and no wheezes. Abdomen: soft +BS NT/ND, no masses palpable Extremities: No cyanosis and no edema he urine is to be gas the arm without of him on outpatient arm    Data Reviewed: Basic Metabolic Panel:  Recent Labs Lab 12/21/13 1436 12/22/13 0235 12/23/13 0552  NA 142 140 142  K 3.4* 3.6* 3.1*  CL 107 104 101  CO2 20 18* 19  GLUCOSE 105* 134* 181*  BUN 55* 55* 72*  CREATININE 4.73* 4.74* 5.42*  CALCIUM 8.6 8.8 9.2   Liver Function Tests:  Recent Labs Lab 12/22/13 0235  AST 32  ALT 27  ALKPHOS 37*  BILITOT 0.6  PROT 7.2  ALBUMIN 3.1*   No  results found for this basename: LIPASE, AMYLASE,  in the last 168 hours No results found for this basename: AMMONIA,  in the last 168 hours CBC:  Recent Labs Lab 12/21/13 1436 12/22/13 0235 12/23/13 0552  WBC 8.6 9.5 9.3  NEUTROABS 6.0  --   --   HGB 7.8* 7.8* 8.6*  HCT 24.3* 24.6* 26.7*  MCV 100.0 99.6  98.5  PLT 241 242 302   Cardiac Enzymes:  Recent Labs Lab 12/21/13 1436 12/21/13 2004 12/22/13 0235 12/22/13 0749  TROPONINI <0.30 <0.30 <0.30 <0.30   BNP (last 3 results)  Recent Labs  12/21/13 1436 12/22/13 0235  PROBNP 8395.0* 9327.0*   CBG: No results found for this basename: GLUCAP,  in the last 168 hours  No results found for this or any previous visit (from the past 240 hour(s)).   Studies: Dg Chest 2 View  12/21/2013   CLINICAL DATA:  Shortness of breath.  EXAM: CHEST  2 VIEW  COMPARISON:  November 17, 2013  FINDINGS: The mediastinal contour is normal. The heart size is enlarged. There is central pulmonary edema. There is no pleural effusion or focal pneumonia. The soft tissues and osseous structures are stable.  IMPRESSION: Mild congestive heart failure.   Electronically Signed   By: Abelardo Diesel M.D.   On: 12/21/2013 15:20    Scheduled Meds: . calcitRIOL  0.25 mcg Oral Daily  . darbepoetin  100 mcg Subcutaneous Q Mon-1800  . diltiazem  120 mg Oral Daily  . fenofibrate  160 mg Oral Daily  . ferrous Q000111Q C-folic acid  1 capsule Oral Q breakfast  . furosemide  80 mg Intravenous Q12H  . metoprolol tartrate  25 mg Oral BID  . omega-3 acid ethyl esters  1 g Oral BID  . pantoprazole  40 mg Oral Daily  . pravastatin  40 mg Oral q1800  . sevelamer carbonate  800 mg Oral TID WC  . sodium bicarbonate  1,300 mg Oral BID  . sodium chloride  3 mL Intravenous Q12H  . Vitamin D (Ergocalciferol)  50,000 Units Oral Q7 days  . Warfarin - Pharmacist Dosing Inpatient   Does not apply q1800   Continuous Infusions:   Active Problems:   Anemia   Atrial fibrillation   Long term (current) use of anticoagulants   CKD (chronic kidney disease) stage 5, GFR less than 15 ml/min   HTN (hypertension)   Dyspnea   CHF (congestive heart failure)    Time spent: [place] [facility] Pager [phone]. If 7PM-7AM, please contact night-coverage at  [email or web address], password [facility] 12/23/2013, 9:24 AM  LOS: 2 days

## 2013-12-23 NOTE — Progress Notes (Signed)
Dr. Blenda Mounts notified by [facility] paging that patient heart rate is going into 170's with little exertion. Cardizem and Metoprolol just given.

## 2013-12-23 NOTE — Care Management Utilization Note (Signed)
UR completed 

## 2013-12-23 NOTE — Progress Notes (Signed)
Sabrina Beck  MRN: [id number]  DOB/AGE: [birth year] 73 y.o.  Primary Care Physician:FUSCO,LAWRENCE J., MD  Admit date: 12/21/2013  Chief Complaint:  Chief Complaint  Patient presents with  . Shortness of Breath    S-Pt presented on  12/21/2013 with  Chief Complaint  Patient presents with  . Shortness of Breath  .    Pt today feels better  Meds . calcitRIOL  0.25 mcg Oral Daily  . darbepoetin  100 mcg Subcutaneous Q Mon-1800  . diltiazem  120 mg Oral Daily  . fenofibrate  160 mg Oral Daily  . ferrous Q000111Q C-folic acid  1 capsule Oral Q breakfast  . furosemide  80 mg Intravenous Q12H  . metoprolol tartrate  25 mg Oral BID  . omega-3 acid ethyl esters  1 g Oral BID  . pantoprazole  40 mg Oral Daily  . pravastatin  40 mg Oral q1800  . sevelamer carbonate  800 mg Oral TID WC  . sodium bicarbonate  1,300 mg Oral BID  . sodium chloride  3 mL Intravenous Q12H  . Vitamin D (Ergocalciferol)  50,000 Units Oral Q7 days  . Warfarin - Pharmacist Dosing Inpatient   Does not apply q1800     Physical Exam: Vital signs in last 24 hours: Temp:  [97.6 F (36.4 C)-98.2 F (36.8 C)] 97.8 F (36.6 C) (05/19 0520) Pulse Rate:  [85-120] 120 (05/19 0842) Resp:  [16-20] 20 (05/19 0520) BP: (118-132)/(77-98) 121/77 mmHg (05/19 0520) SpO2:  [96 %] 96 % (05/19 0520) Weight:  [211 lb 3.2 oz (95.8 kg)] 211 lb 3.2 oz (95.8 kg) (05/18 1707) Weight change: -8 lb 12.8 oz (-3.992 kg) Last BM Date: 12/23/13  Intake/Output from previous day: 05/18 0701 - 05/19 0700 In: 720 [P.O.:720] Out: 1350 [Urine:1350] Total I/O In: -  Out: 1000 [Urine:1000]   Physical Exam: General- pt is awake,alert, oriented to time place and person Resp- No acute REsp distress, CTA B/L NO Rhonchi CVS- S1S2 regular ij rate and rhythm GIT- BS+, soft, NT, ND EXT- NO LE Edema, NO Cyanosis Access-AVF + Bruit  Lab Results: CBC  Recent Labs  12/22/13 0235 12/23/13 0552  WBC 9.5 9.3  HGB 7.8*  8.6*  HCT 24.6* 26.7*  PLT 242 302    BMET  Recent Labs  12/22/13 0235 12/23/13 0552  NA 140 142  K 3.6* 3.1*  CL 104 101  CO2 18* 19  GLUCOSE 134* 181*  BUN 55* 72*  CREATININE 4.74* 5.42*  CALCIUM 8.8 9.2    Creat trend 2015  4.0--5.1=>5.42 2014  3.7--4.2 2013   2.9--3.6 2012   2.4--3.0  Lab Results  Component Value Date   PTH 206.2* 01/11/2012   CALCIUM 9.2 12/23/2013   PHOS 5.5* 12/12/2013    CXr shows mild CHF      Impression: 1)Renal  CKD stage 5 .               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  Secondary Hyperparathyroidism  present . Phosphorus not at goal.    On Binders Vitamin 25-OH low,on replacement  5)CHF PMD following  6)Electrolytes Hypokalemic    Sec to Diuretics Normonatremic   7)Acid base Co2 at goal     Plan:  Will suggest to replete K Will suggest to change Diuretics to PO      Manpreet S Bhutani 12/23/2013, 8:49 AM

## 2013-12-24 DIAGNOSIS — I5031 Acute diastolic (congestive) heart failure: Secondary | ICD-10-CM

## 2013-12-24 DIAGNOSIS — N186 End stage renal disease: Secondary | ICD-10-CM

## 2013-12-24 DIAGNOSIS — E782 Mixed hyperlipidemia: Secondary | ICD-10-CM

## 2013-12-24 LAB — CBC
HCT: 28.7 % — ABNORMAL LOW (ref 36.0–46.0)
HEMOGLOBIN: 9.4 g/dL — AB (ref 12.0–15.0)
MCH: 31.9 pg (ref 26.0–34.0)
MCHC: 32.8 g/dL (ref 30.0–36.0)
MCV: 97.3 fL (ref 78.0–100.0)
Platelets: 355 10*3/uL (ref 150–400)
RBC: 2.95 MIL/uL — ABNORMAL LOW (ref 3.87–5.11)
RDW: 14.9 % (ref 11.5–15.5)
WBC: 10.3 10*3/uL (ref 4.0–10.5)

## 2013-12-24 LAB — BASIC METABOLIC PANEL
BUN: 90 mg/dL — ABNORMAL HIGH (ref 6–23)
CO2: 21 mEq/L (ref 19–32)
Calcium: 9.5 mg/dL (ref 8.4–10.5)
Chloride: 96 mEq/L (ref 96–112)
Creatinine, Ser: 6.11 mg/dL — ABNORMAL HIGH (ref 0.50–1.10)
GFR calc Af Amer: 7 mL/min — ABNORMAL LOW (ref >90)
GFR, EST NON AFRICAN AMERICAN: 6 mL/min — AB (ref >90)
GLUCOSE: 116 mg/dL — AB (ref 70–99)
POTASSIUM: 3.8 meq/L (ref 3.7–5.3)
SODIUM: 139 meq/L (ref 137–147)

## 2013-12-24 LAB — PROTIME-INR
INR: 3.19 — AB (ref 0.00–1.49)
PROTHROMBIN TIME: 31.5 s — AB (ref 11.6–15.2)

## 2013-12-24 MED ORDER — FUROSEMIDE 40 MG PO TABS
40.0000 mg | ORAL_TABLET | Freq: Two times a day (BID) | ORAL | Status: DC
  Administered 2013-12-24 – 2013-12-25 (×2): 40 mg via ORAL
  Filled 2013-12-24 (×2): qty 1

## 2013-12-24 MED ORDER — METOPROLOL TARTRATE 1 MG/ML IV SOLN
5.0000 mg | Freq: Once | INTRAVENOUS | Status: AC
  Administered 2013-12-24: 5 mg via INTRAVENOUS
  Filled 2013-12-24: qty 5

## 2013-12-24 MED ORDER — METOPROLOL TARTRATE 50 MG PO TABS
50.0000 mg | ORAL_TABLET | Freq: Two times a day (BID) | ORAL | Status: DC
  Administered 2013-12-24 – 2013-12-25 (×2): 50 mg via ORAL
  Filled 2013-12-24 (×2): qty 1

## 2013-12-24 MED ORDER — WARFARIN SODIUM 1 MG PO TABS
0.5000 mg | ORAL_TABLET | Freq: Once | ORAL | Status: AC
  Administered 2013-12-24: 0.5 mg via ORAL
  Filled 2013-12-24: qty 1

## 2013-12-24 NOTE — Progress Notes (Addendum)
[facility] PROGRESS NOTE  Sabrina Beck [id number] DOB: Feb 03, 1941 DOA: 12/21/2013 PCP: Glo Herring., MD  Assessment/Plan: Acute on chronic diastolic Congestive heart failure/fluid overload.   -On IV Lasix IV , creatinine trending up today>> renal dosing her Lasix, follow -Continue monitoring strict I and os and daily weights>> only about half a liter out over past 24 hours -Appreciate renal assistance  -Continue metoprolol  -Improving clinically - 2-D echo shows EF 50-55% with Mild hypokinesis of the mid inferoseptal and apical septal myocardium  and troponins negative x3.  chronic kidney disease stage IV, -Per renal she has no indications for emergent dialysis at this time. She has a left arm fistula which is maturing - Creatinine continuing to trending up  Dr Lowanda Foster assisting with diuresis, following Chronic atrial fibrillation  -Continues to be Tachycardic with minimal exertion even on increased metoprolol, and her BPs may not tolerate further up titration of her meds. I have consulted cardiology for further recommendationsshe is followed by Dr.Croitoru -Continue current Cardizem -Continue chronic anticoagulation with warfarin>> INR elevated, pharmacy following closely and dosing as appropriate - Follow and consider cardiology as clinically appropriate>>  Hypertension.  -Okay control on outpatient meds. Anemia of chronic kidney disease -Hemoglobin stable, on aranesp per renal  Code Status: Full Family Communication: None at bedside Disposition Plan: To home when medically ready   Consultants:  Renal   Procedures:  Echocardiogram Study Conclusions  - Procedure narrative: Transthoracic echocardiography. Image quality was suboptimal due to poor sound transmission. - Left ventricle: The cavity size was normal. Wall thickness was increased in a pattern of moderate LVH. Systolic function was normal. The estimated ejection fraction was in the range of  50% to 55%. The study was not technically sufficient to allow evaluation of LV diastolic dysfunction due to atrial fibrillation. - Regional wall motion abnormality: Mild hypokinesis of the mid inferoseptal and apical septal myocardium. - Aortic valve: Mildly calcified annulus. - Mitral valve: Mildly calcified annulus. Mildly thickened leaflets . There was mild to moderate regurgitation. - Left atrium: The atrium was moderately dilated. - Tricuspid valve: There was mild regurgitation. Mildly elevated pulmonary pressures (RVSP 38 mmHg).   Antibiotics:  None  HPI/Subjective: Still states breathing improving, per nursing tachycardic again to 170 this a.m. with minimal exertion-nonsustained.she denies chest pain. She denies nausea vomiting, is alert and appropriate Objective: Filed Vitals:   12/24/13 1008  BP:   Pulse: 160  Temp:   Resp:     Intake/Output Summary (Last 24 hours) at 12/24/13 1038 Last data filed at 12/23/13 2100  Gross per 24 hour  Intake    480 ml  Output   1050 ml  Net   -570 ml   Filed Weights   12/22/13 0508 12/22/13 1707 12/24/13 0433  Weight: 98.6 kg (217 lb 6 oz) 95.8 kg (211 lb 3.2 oz) 93.895 kg (207 lb)    Exam:  General: alert & oriented x 3 In NAD Cardiovascular: RRR, nl S1 s2 Respiratory: Decreased breath sounds at the bases, no crackles and no wheezes. Abdomen: soft +BS NT/ND, no masses palpable Extremities: No cyanosis and no edema    Data Reviewed: Basic Metabolic Panel:  Recent Labs Lab 12/21/13 1436 12/22/13 0235 12/23/13 0552 12/24/13 0925  NA 142 140 142 139  K 3.4* 3.6* 3.1* 3.8  CL 107 104 101 96  CO2 20 18* 19 21  GLUCOSE 105* 134* 181* 116*  BUN 55* 55* 72* 90*  CREATININE 4.73* 4.74* 5.42* 6.11*  CALCIUM 8.6  8.8 9.2 9.5   Liver Function Tests:  Recent Labs Lab 12/22/13 0235  AST 32  ALT 27  ALKPHOS 37*  BILITOT 0.6  PROT 7.2  ALBUMIN 3.1*   No results found for this basename: LIPASE, AMYLASE,  in the  last 168 hours No results found for this basename: AMMONIA,  in the last 168 hours CBC:  Recent Labs Lab 12/21/13 1436 12/22/13 0235 12/23/13 0552 12/24/13 0545  WBC 8.6 9.5 9.3 10.3  NEUTROABS 6.0  --   --   --   HGB 7.8* 7.8* 8.6* 9.4*  HCT 24.3* 24.6* 26.7* 28.7*  MCV 100.0 99.6 98.5 97.3  PLT 241 242 302 355   Cardiac Enzymes:  Recent Labs Lab 12/21/13 1436 12/21/13 2004 12/22/13 0235 12/22/13 0749  TROPONINI <0.30 <0.30 <0.30 <0.30   BNP (last 3 results)  Recent Labs  12/21/13 1436 12/22/13 0235  PROBNP 8395.0* 9327.0*   CBG: No results found for this basename: GLUCAP,  in the last 168 hours  No results found for this or any previous visit (from the past 240 hour(s)).   Studies: No results found.  Scheduled Meds: . calcitRIOL  0.25 mcg Oral Daily  . darbepoetin  100 mcg Subcutaneous Q Mon-1800  . diltiazem  120 mg Oral Daily  . fenofibrate  160 mg Oral Daily  . ferrous Q000111Q C-folic acid  1 capsule Oral Q breakfast  . furosemide  40 mg Oral BID  . metoprolol tartrate  37.5 mg Oral BID  . omega-3 acid ethyl esters  1 g Oral BID  . pantoprazole  40 mg Oral Daily  . pravastatin  40 mg Oral q1800  . sevelamer carbonate  800 mg Oral TID WC  . sodium bicarbonate  1,300 mg Oral BID  . sodium chloride  3 mL Intravenous Q12H  . Vitamin D (Ergocalciferol)  50,000 Units Oral Q7 days  . warfarin  0.5 mg Oral Once  . Warfarin - Pharmacist Dosing Inpatient   Does not apply q1800   Continuous Infusions:   Active Problems:   Anemia   Atrial fibrillation   Long term (current) use of anticoagulants   CKD (chronic kidney disease) stage 5, GFR less than 15 ml/min   HTN (hypertension)   Dyspnea   CHF (congestive heart failure)    Time spent: [place] [facility] Pager [phone]. If 7PM-7AM, please contact night-coverage at [email or web address], password [facility] 12/24/2013, 10:38 AM  LOS: 3 days

## 2013-12-24 NOTE — Consult Note (Signed)
The patient was seen and examined, and I agree with the assessment and plan as documented above, with modifications as noted below. Pt with ESRD soon to be on HD admitted with what appears to be diastolic heart failure, EF 50-55% with moderate LVH and wall motion abnormality as described above. She appears to be euvolemic today. She has a h/o atrial fibrillation and HTN and is followed by Dr. Sallyanne Kuster. Will increase metoprolol to 50 mg bid today, and diltiazem can also be increased as needed so long as BP tolerates. Will make a follow up appointment for her with Dr. Sallyanne Kuster for further adjustments.

## 2013-12-24 NOTE — Consult Note (Signed)
Reason for Consult: Acute on chronic diastolic heart failure, rapid atrial fibrillation Referring Physician: PTH  Sabrina Beck is an 73 y.o. female.  HPI: This is a 73 year old female patient of Dr. Sallyanne Kuster who was admitted with acute on chronic CHF. 2-D echo showed EF of 50-55% with mild hypokinesis of the mid inferior septal and apical septal myocardium. She has end-stage renal disease which will eventually require hemodialysis. Troponins were negative x3. She also has chronic atrial fibrillation on Coumadin, hypertension.   She has diuresed and is feeling better. Her heart rate has been jumping up everytime she ambulates. Atrial fib at 120/m, 95/m at rest. It got up to 170/m yesterday.Her metoprolol was increased 37.5 mg BID yesterday.  Past Medical History  Diagnosis Date  . Anemia 03/27/2011  . Atrial fibrillation   . Murmur 06/04/2006,09/29/10    2D Echo perform both test shows EF 55%  . Chest pain 05/15/2006    stress test perform negative for ischemia  . Systemic hypertension   . Mixed hyperlipidemia   . Cancer     uterine  . Complication of anesthesia   . PONV (postoperative nausea and vomiting)   . Dysrhythmia     afib  . Renal disorder   . Kidney disease     stage 4 chronic  . GERD (gastroesophageal reflux disease)   . Diverticulosis     Past Surgical History  Procedure Laterality Date  . Cholecystectomy    . Abdominal hysterectomy    . Breast surgery Left     tumors non canerous 2- 3 different surgeries  . Knee arthroscopy    . Av fistula placement Left 11/17/2013    Procedure: CREATION OF LEFT RADIOCEPHALIC ARTERIOVENOUS (AV) FISTULA ;  Surgeon: [name], MD;  Location: [facility] OR;  Service: Vascular;  Laterality: Left;    Family History  Problem Relation Age of Onset  . Diabetes Mother   . Hypertension Mother   . Diabetes Father   . Heart disease Father     before age 31  . Heart attack Father   . Diabetes Sister   . Hyperlipidemia Sister   .  Hypertension Sister   . Cancer Brother   . Diabetes Brother   . Heart disease Brother   . Hypertension Brother   . Heart attack Brother     Social History:  reports that she quit smoking about 14 years ago. She does not have any smokeless tobacco history on file. She reports that she does not drink alcohol or use illicit drugs.  Allergies: No Known Allergies  Medications: Scheduled Meds: . calcitRIOL  0.25 mcg Oral Daily  . darbepoetin  100 mcg Subcutaneous Q Mon-1800  . diltiazem  120 mg Oral Daily  . fenofibrate  160 mg Oral Daily  . ferrous [id number] C-folic acid  1 capsule Oral Q breakfast  . furosemide  40 mg Oral BID  . metoprolol tartrate  37.5 mg Oral BID  . omega-3 acid ethyl esters  1 g Oral BID  . pantoprazole  40 mg Oral Daily  . pravastatin  40 mg Oral q1800  . sevelamer carbonate  800 mg Oral TID WC  . sodium bicarbonate  1,300 mg Oral BID  . sodium chloride  3 mL Intravenous Q12H  . Vitamin D (Ergocalciferol)  50,000 Units Oral Q7 days  . warfarin  0.5 mg Oral Once  . Warfarin - Pharmacist Dosing Inpatient   Does not apply q1800   Continuous Infusions:  PRN  Meds:.acetaminophen, ondansetron (ZOFRAN) IV, ondansetron   Results for orders placed during the hospital encounter of 12/21/13 (from the past 48 hour(s))  PROTIME-INR     Status: Abnormal   Collection Time    12/23/13  5:52 AM      Result Value Ref Range   Prothrombin Time 33.1 (*) 11.6 - 15.2 seconds   INR 3.40 (*) 0.00 - 1.49  CBC     Status: Abnormal   Collection Time    12/23/13  5:52 AM      Result Value Ref Range   WBC 9.3  4.0 - 10.5 K/uL   RBC 2.71 (*) 3.87 - 5.11 MIL/uL   Hemoglobin 8.6 (*) 12.0 - 15.0 g/dL   HCT 26.7 (*) 36.0 - 46.0 %   MCV 98.5  78.0 - 100.0 fL   MCH 31.7  26.0 - 34.0 pg   MCHC 32.2  30.0 - 36.0 g/dL   RDW 15.1  11.5 - 15.5 %   Platelets 302  150 - 400 K/uL  BASIC METABOLIC PANEL     Status: Abnormal   Collection Time    12/23/13  5:52 AM      Result  Value Ref Range   Sodium 142  137 - 147 mEq/L   Potassium 3.1 (*) 3.7 - 5.3 mEq/L   Chloride 101  96 - 112 mEq/L   CO2 19  19 - 32 mEq/L   Glucose, Bld 181 (*) 70 - 99 mg/dL   BUN 72 (*) 6 - 23 mg/dL   Creatinine, Ser 5.42 (*) 0.50 - 1.10 mg/dL   Calcium 9.2  8.4 - 10.5 mg/dL   GFR calc non Af Amer 7 (*) >90 mL/min   GFR calc Af Amer 8 (*) >90 mL/min   Comment: (NOTE)     The eGFR has been calculated using the CKD EPI equation.     This calculation has not been validated in all clinical situations.     eGFR's persistently <90 mL/min signify possible Chronic Kidney     Disease.  PROTIME-INR     Status: Abnormal   Collection Time    12/24/13  5:45 AM      Result Value Ref Range   Prothrombin Time 31.5 (*) 11.6 - 15.2 seconds   INR 3.19 (*) 0.00 - 1.49  CBC     Status: Abnormal   Collection Time    12/24/13  5:45 AM      Result Value Ref Range   WBC 10.3  4.0 - 10.5 K/uL   RBC 2.95 (*) 3.87 - 5.11 MIL/uL   Hemoglobin 9.4 (*) 12.0 - 15.0 g/dL   HCT 28.7 (*) 36.0 - 46.0 %   MCV 97.3  78.0 - 100.0 fL   MCH 31.9  26.0 - 34.0 pg   MCHC 32.8  30.0 - 36.0 g/dL   RDW 14.9  11.5 - 15.5 %   Platelets 355  150 - 400 K/uL  BASIC METABOLIC PANEL     Status: Abnormal   Collection Time    12/24/13  9:25 AM      Result Value Ref Range   Sodium 139  137 - 147 mEq/L   Potassium 3.8  3.7 - 5.3 mEq/L   Comment: DELTA CHECK NOTED   Chloride 96  96 - 112 mEq/L   CO2 21  19 - 32 mEq/L   Glucose, Bld 116 (*) 70 - 99 mg/dL   BUN 90 (*) 6 - 23 mg/dL  Creatinine, Ser 6.11 (*) 0.50 - 1.10 mg/dL   Calcium 9.5  8.4 - 10.5 mg/dL   GFR calc non Af Amer 6 (*) >90 mL/min   GFR calc Af Amer 7 (*) >90 mL/min   Comment: (NOTE)     The eGFR has been calculated using the CKD EPI equation.     This calculation has not been validated in all clinical situations.     eGFR's persistently <90 mL/min signify possible Chronic Kidney     Disease.    No results found.  ROS See HPI Eyes:  Negative Ears:Negative for hearing loss, tinnitus Cardiovascular: Negative for chest pain,positive for  palpitations,irregular heartbeat, dyspnea, dyspnea on exertion, negative near-syncope, orthopnea, paroxysmal nocturnal dyspnea and syncope,edema, claudication, cyanosis,.  Respiratory:   Negative for cough, hemoptysis, sleep disturbances due to breathing, sputum production and wheezing.   Endocrine: Negative for cold intolerance and heat intolerance.  Hematologic/Lymphatic: Negative for adenopathy and bleeding problem. Does not bruise/bleed easily.  Musculoskeletal: Negative.   Gastrointestinal: Negative for nausea, vomiting, reflux, abdominal pain, diarrhea, constipation.   Genitourinary: Negative for bladder incontinence, dysuria, flank pain, frequency, hematuria, hesitancy, nocturia and urgency.  Neurological: Negative.  Allergic/Immunologic: Negative for environmental allergies.  Blood pressure 110/75, pulse 105, temperature 97.9 F (36.6 C), temperature source Oral, resp. rate 20, height 5' 3.5" (1.613 m), weight 207 lb (93.895 kg), SpO2 94.00%. Physical Exam PHYSICAL EXAM: Well-nournished, in no acute distress. Neck: No JVD, HJR, Bruit, or thyroid enlargement Lungs: Decreased Breath sounds with few crackles at bases.No tachypnea, clear without wheezing, rales, or rhonchi Cardiovascular: irreg irreg, PMI not displaced, 2/6 systolic murmur left sternal Border, no gallops, bruit, thrill, or heave. Abdomen: BS normal. Soft without organomegaly, masses, lesions or tenderness. Extremities: without cyanosis, clubbing or edema. Good distal pulses bilateral SKin: Warm, no lesions or rashes  Musculoskeletal: No deformities Neuro: no focal signs  2Decho 12/22/13: Study Conclusions  - Procedure narrative: Transthoracic echocardiography. Image   quality was suboptimal due to poor sound transmission. - Left ventricle: The cavity size was normal. Wall thickness was   increased in a pattern of  moderate LVH. Systolic function was   normal. The estimated ejection fraction was in the range of 50%   to 55%. The study was not technically sufficient to allow   evaluation of LV diastolic dysfunction due to atrial   fibrillation. - Regional wall motion abnormality: Mild hypokinesis of the mid   inferoseptal and apical septal myocardium. - Aortic valve: Mildly calcified annulus. - Mitral valve: Mildly calcified annulus. Mildly thickened leaflets   . There was mild to moderate regurgitation. - Left atrium: The atrium was moderately dilated. - Tricuspid valve: There was mild regurgitation. Mildly elevated   pulmonary pressures (RVSP 38 mmHg).    Assessment/Plan: Acute on chronic diastolic heart failure: diuresed and now compensated.  Chronic atrial fibrillation on Coumadin: heart rate still going up on increased metoprolol. Will increase to 7m BID.Can also increase Cardizem if needed. Hopefully stable for discharge in am and f/u with Dr. CSallyanne Kuster Hypertension: Stable  End-stage renal disease impending hemodialysis, crt 6.11  Anemia secondary to chronic disease  MImogene Burn5/20/2015, 11:53 AM

## 2013-12-24 NOTE — Evaluation (Signed)
Physical Therapy Evaluation Patient Details Name: Sabrina Beck MRN: [id number] DOB: 03/13/1941 Today's Date: 12/24/2013   History of Present Illness  73yo female adm with 1wk hx of dyspnea, possible HF, baseline ESRD and awaiting dialysis; PMHx:  afib  Clinical Impression  Pt will benefit from PT to address deficits below; activity limited on eval due to elevated HR, up to 128 with amb, down in 110s after 45sec rest; Pt anxious to go home; should not need any PT f/u once rate controlled     Follow Up Recommendations No PT follow up    Equipment Recommendations  None recommended by PT    Recommendations for Other Services       Precautions / Restrictions Precautions Precautions: Other (comment) Precaution Comments: afib, incr HR Restrictions Weight Bearing Restrictions: No      Mobility  Bed Mobility                  Transfers Overall transfer level: Modified independent Equipment used: None                Ambulation/Gait Ambulation/Gait assistance: Min guard Ambulation Distance (Feet): 35 Feet Assistive device: 1 person hand held assist       General Gait Details: one slight LOB with min/guard  recovery; HR to 128 with amb, 110s after amb, at rest  Stairs            Wheelchair Mobility    Modified Rankin (Stroke Patients Only)       Balance Overall balance assessment: Needs assistance Sitting-balance support: No upper extremity supported;Feet supported Sitting balance-Leahy Scale: Normal       Standing balance-Leahy Scale: Good                               Pertinent Vitals/Pain Denies pain    Home Living Family/patient expects to be discharged to:: Private residence Living Arrangements: Children Available Help at Discharge: Family Type of Home: House Home Access: Stairs to enter   Technical brewer of Steps: 1 Home Layout: One level Home Equipment: Hand held shower head;Shower seat;Grab bars -  tub/shower;Wheelchair - manual      Prior Function Level of Independence: Independent               Hand Dominance   Dominant Hand: Right    Extremity/Trunk Assessment   Upper Extremity Assessment: Defer to OT evaluation           Lower Extremity Assessment: Overall WFL for tasks assessed         Communication   Communication: No difficulties  Cognition Arousal/Alertness: Awake/alert Behavior During Therapy: WFL for tasks assessed/performed Overall Cognitive Status: Within Functional Limits for tasks assessed                      General Comments      Exercises        Assessment/Plan    PT Assessment Patient needs continued PT services  PT Diagnosis Difficulty walking   PT Problem List Decreased activity tolerance;Decreased mobility  PT Treatment Interventions Gait training;Functional mobility training;Therapeutic exercise;Patient/family education   PT Goals (Current goals can be found in the Care Plan section) Acute Rehab PT Goals Patient Stated Goal: wants to go home today or tomorrow PT Goal Formulation: With patient Time For Goal Achievement: 12/30/13 Potential to Achieve Goals: Good    Frequency Min 3X/week   Barriers to discharge  Co-evaluation               End of Session   Activity Tolerance: Treatment limited secondary to medical complications (Comment) (limited by PT due to incr HR) Patient left: in chair;with call bell/phone within reach;with family/visitor present Nurse Communication: Mobility status         Time: [id number] PT Time Calculation (min): 15 min   Charges:   PT Evaluation $Initial PT Evaluation Tier I: 1 Procedure PT Treatments $Gait Training: 8-22 mins   PT G Codes:          Neil Crouch 12/24/2013, 10:57 AM

## 2013-12-24 NOTE — Evaluation (Signed)
Occupational Therapy Evaluation Patient Details Name: Sabrina Beck MRN: [id number] DOB: July 11, 1941 Today's Date: 12/24/2013    History of Present Illness 73yo female adm with 1wk hx of dyspnea, possible HF, baseline ESRD and awaiting dialysis; PMHx:  afib   Clinical Impression   Pt is presenting to acute OT with above situation.  She is at a modified independent level with all ADL and IDL needs, just fatigues easily.  Educated pt on energy conservation strategies and demonstrated LE dressing techniques.  Pt experssed interst. Left pt with handout of energy conservation techniques.  Pt needs no further OT services at this time.  Acute OT to sign off.    Follow Up Recommendations  No OT follow up    Equipment Recommendations  None recommended by OT    Recommendations for Other Services       Precautions / Restrictions Precautions Precautions: Other (comment) Precaution Comments: afib, incr HR Restrictions Weight Bearing Restrictions: No      Mobility Bed Mobility                  Transfers                  Balance                                    ADL Overall ADL's : Modified independent                                       General ADL Comments: Pt become fatigued with exertion.  Educated pt on energy conservation strategies and provided handout.     Vision                     Perception     Praxis      Pertinent Vitals/Pain      Hand Dominance Right   Extremity/Trunk Assessment Upper Extremity Assessment Upper Extremity Assessment: Overall WFL for tasks assessed   Lower Extremity Assessment Lower Extremity Assessment: Defer to PT evaluation       Communication Communication Communication: No difficulties   Cognition Arousal/Alertness: Awake/alert Behavior During Therapy: WFL for tasks assessed/performed Overall Cognitive Status: Within Functional Limits for tasks assessed                      General Comments       Exercises       Shoulder Instructions      Home Living Family/patient expects to be discharged to:: Private residence Living Arrangements: Children Available Help at Discharge: Family Type of Home: House Home Access: Stairs to enter Technical brewer of Steps: 1   Home Layout: One level     Bathroom Shower/Tub: Occupational psychologist: Handicapped height     Home Equipment: Hand held shower head;Shower seat;Grab bars - tub/shower;Wheelchair - manual          Prior Functioning/Environment Level of Independence: Independent             OT Diagnosis:     OT Problem List:     OT Treatment/Interventions:      OT Goals(Current goals can be found in the care plan section) Acute Rehab OT Goals Patient Stated Goal: wants to go home today or tomorrow OT Goal Formulation: With patient Time For  Goal Achievement: 01/07/14 Potential to Achieve Goals: Good ADL Goals Additional ADL Goal #1: Pt will be educated on energy conservation strategies.  OT Frequency:     Barriers to D/C:            Co-evaluation              End of Session    Activity Tolerance: Patient tolerated treatment well Patient left: in chair   Time: 0945-1006 OT Time Calculation (min): 21 min Charges:  OT General Charges $OT Visit: 1 Procedure OT Evaluation $Initial OT Evaluation Tier I: 1 Procedure OT Treatments $Self Care/Home Management : 8-22 mins G-Codes:     Bea Graff, [place], OTR/L [phone]  12/24/2013, 12:35 PM

## 2013-12-24 NOTE — Progress Notes (Signed)
[place] for Coumadin Indication: atrial fibrillation  No Known Allergies  Patient Measurements: Height: 5' 3.5" (161.3 cm) Weight: 207 lb (93.895 kg) IBW/kg (Calculated) : 53.55  Vital Signs: Temp: 97.9 F (36.6 C) (05/20 0818) Temp src: Oral (05/20 0818) BP: 117/71 mmHg (05/20 0818) Pulse Rate: 109 (05/20 0818)  Labs:  Recent Labs  12/21/13 1436  12/21/13 2004 12/22/13 0235 12/22/13 0749 12/23/13 0552 12/24/13 0545  HGB 7.8*  --   --  7.8*  --  8.6* 9.4*  HCT 24.3*  --   --  24.6*  --  26.7* 28.7*  PLT 241  --   --  242  --  302 355  LABPROT  --   < >  --  30.3*  --  33.1* 31.5*  INR  --   < >  --  3.03*  --  3.40* 3.19*  CREATININE 4.73*  --   --  4.74*  --  5.42*  --   TROPONINI <0.30  --  <0.30 <0.30 <0.30  --   --   < > = values in this interval not displayed.  Estimated Creatinine Clearance: 10 ml/min (by C-G formula based on Cr of 5.42).  Medical History: Past Medical History  Diagnosis Date  . Anemia 03/27/2011  . Atrial fibrillation   . Murmur 06/04/2006,09/29/10    2D Echo perform both test shows EF 55%  . Chest pain 05/15/2006    stress test perform negative for ischemia  . Systemic hypertension   . Mixed hyperlipidemia   . Cancer     uterine  . Complication of anesthesia   . PONV (postoperative nausea and vomiting)   . Dysrhythmia     afib  . Renal disorder   . Kidney disease     stage 4 chronic  . GERD (gastroesophageal reflux disease)   . Diverticulosis    Medications:  Scheduled:  . calcitRIOL  0.25 mcg Oral Daily  . darbepoetin  100 mcg Subcutaneous Q Mon-1800  . diltiazem  120 mg Oral Daily  . fenofibrate  160 mg Oral Daily  . ferrous [id number] C-folic acid  1 capsule Oral Q breakfast  . furosemide  80 mg Intravenous Q12H  . metoprolol tartrate  37.5 mg Oral BID  . omega-3 acid ethyl esters  1 g Oral BID  . pantoprazole  40 mg Oral Daily  . pravastatin  40 mg Oral q1800  .  sevelamer carbonate  800 mg Oral TID WC  . sodium bicarbonate  1,300 mg Oral BID  . sodium chloride  3 mL Intravenous Q12H  . Vitamin D (Ergocalciferol)  50,000 Units Oral Q7 days  . warfarin  0.5 mg Oral Once  . Warfarin - Pharmacist Dosing Inpatient   Does not apply q1800   Medications Prior to Admission  Medication Sig Dispense Refill  . acetaminophen (TYLENOL) 500 MG tablet Take 1,000 mg by mouth daily as needed for moderate pain.      Marland Kitchen azithromycin (ZITHROMAX) 250 MG tablet Take 1 tablet by mouth daily.      . calcitRIOL (ROCALTROL) 0.25 MCG capsule Take 0.25 mcg by mouth daily.      Marland Kitchen diltiazem (CARDIZEM CD) 120 MG 24 hr capsule Take 120 mg by mouth daily.      . Fe Fum-FA-B Cmp-C-Zn-Mg-Mn-Cu (FERROCITE PLUS PO) Take 1 tablet by mouth daily.       . fenofibrate (TRICOR) 145 MG tablet TAKE ONE (1) TABLET BY MOUTH EVERY  DAY  30 tablet  11  . metoprolol tartrate (LOPRESSOR) 25 MG tablet TAKE ONE AND ONE-HALF TABLETS TWO TIMES DAILY.  90 tablet  11  . Multiple Vitamins-Minerals (VISION-VITE PRESERVE PO) Take 1 tablet by mouth 2 (two) times daily.       . Omega-3 Fatty Acids (FISH OIL) 1200 MG CAPS Take 1 capsule by mouth 2 (two) times daily.      Marland Kitchen omeprazole (PRILOSEC) 20 MG capsule Take 20 mg by mouth daily.      . pravastatin (PRAVACHOL) 40 MG tablet Take 1 tablet (40 mg total) by mouth at bedtime.  90 tablet  3  . sevelamer (RENVELA) 800 MG tablet Take 800 mg by mouth 3 (three) times daily with meals. And patient also takes wit snacks      . sodium bicarbonate 650 MG tablet Take 650 mg by mouth 3 (three) times daily.      . Vitamin D, Ergocalciferol, (DRISDOL) 50000 UNITS CAPS Take 50,000 Units by mouth every 7 (seven) days. Takes on Tuesday.      . warfarin (COUMADIN) 5 MG tablet Take 2.5-5 mg by mouth daily. 1 tablet (5 mg) on sun,tues and thurs and all other days take half tab (2.5 mg)       Assessment: 73 yo F on chronic warfarin for Afib.  Home dose listed above.  INR  therapeutic on admission, however has trended up to upper end of range.  No coumadin was given on 5/19.  No bleeding noted.   Goal of Therapy:  INR 2-3   Plan:   Coumadin 1/2 mg (0.5mg ) today x 1  INR daily  Tabitha Riggins A Billiejo Sorto 12/24/2013,9:13 AM

## 2013-12-24 NOTE — Progress Notes (Signed)
Late entry: Notifed Dr. Dillard Essex that pts HR was in 170's this was with activity, pt is a chronic Afib pt. BP was assessed and was WNL. Pt was asymptomatic at this time. Pt was encouraged to rest and HR came down to 120's-130's. HR again went up to 170's at 0930, at this time pt received sceduled metoprolol and cardizem and rested. Pts heart rate began to decrease at that time. Pt later worked with PT and heart rate reached the 130's. At rest HR was 110's-120's for the majority of the day. Later this afternoon, I received tele alerts stating that the pt was in [place], which it had done a couple of times throughout the day, but didn't appear to be true [place]. I showed the telemetry strips to the hospitalist on the floor, as Dr. Dillard Essex had returned to [place] at that time. Dr. Roderic Palau, and another RN were consulted and none of [place] felt that the strip that we assessed was Vtach. However, because of the elevated heart rate, Dr. Roderic Palau gave a one time order for 5mg  IV metoprolol which was given to pt at that time. Pts HR was much better controlled for the remainder of the day. Sabrina Beck

## 2013-12-24 NOTE — Progress Notes (Signed)
Sabrina Beck  MRN: [id number]  DOB/AGE: [birth year] 73 y.o.  Primary Care Physician:FUSCO,LAWRENCE J., MD  Admit date: 12/21/2013  Chief Complaint:  Chief Complaint  Patient presents with  . Shortness of Breath    S-Pt presented on  12/21/2013 with  Chief Complaint  Patient presents with  . Shortness of Breath  .    Pt today feels better    Pt is asking about " When Can I go home" Meds . calcitRIOL  0.25 mcg Oral Daily  . darbepoetin  100 mcg Subcutaneous Q Mon-1800  . diltiazem  120 mg Oral Daily  . fenofibrate  160 mg Oral Daily  . ferrous Q000111Q C-folic acid  1 capsule Oral Q breakfast  . furosemide  80 mg Intravenous Q12H  . metoprolol tartrate  37.5 mg Oral BID  . omega-3 acid ethyl esters  1 g Oral BID  . pantoprazole  40 mg Oral Daily  . pravastatin  40 mg Oral q1800  . sevelamer carbonate  800 mg Oral TID WC  . sodium bicarbonate  1,300 mg Oral BID  . sodium chloride  3 mL Intravenous Q12H  . Vitamin D (Ergocalciferol)  50,000 Units Oral Q7 days  . warfarin  0.5 mg Oral Once  . Warfarin - Pharmacist Dosing Inpatient   Does not apply q1800     Physical Exam: Vital signs in last 24 hours: Temp:  [97.5 F (36.4 C)-97.9 F (36.6 C)] 97.9 F (36.6 C) (05/20 0818) Pulse Rate:  [95-160] 160 (05/20 1008) Resp:  [20] 20 (05/20 0818) BP: (107-119)/(62-73) 117/71 mmHg (05/20 0818) SpO2:  [92 %-97 %] 97 % (05/20 0818) Weight:  [207 lb (93.895 kg)] 207 lb (93.895 kg) (05/20 0433) Weight change: -4 lb 3.2 oz (-1.905 kg) Last BM Date: 12/24/13  Intake/Output from previous day: 05/19 0701 - 05/20 0700 In: 600 [P.O.:600] Out: 2050 [Urine:2050]     Physical Exam: General- pt is awake,alert, oriented to time place and person Resp- No acute REsp distress, CTA B/L NO Rhonchi( better ) CVS- S1S2 regular ij rate and rhythm GIT- BS+, soft, NT, ND EXT- NO LE Edema, NO Cyanosis Access-AVF + Bruit ( Not matured yet)  Lab Results: CBC  Recent Labs  12/23/13 0552 12/24/13 0545  WBC 9.3 10.3  HGB 8.6* 9.4*  HCT 26.7* 28.7*  PLT 302 355    BMET  Recent Labs  12/23/13 0552 12/24/13 0925  NA 142 139  K 3.1* 3.8  CL 101 96  CO2 19 21  GLUCOSE 181* 116*  BUN 72* 90*  CREATININE 5.42* 6.11*  CALCIUM 9.2 9.5    Creat trend 2015  4.0--5.1=>5.42=>6.11 2014  3.7--4.2 2013   2.9--3.6 2012   2.4--3.0  Lab Results  Component Value Date   PTH 206.2* 01/11/2012   CALCIUM 9.5 12/24/2013   PHOS 5.5* 12/12/2013    CXr shows mild CHF      Impression: 1)Renal  AKi on CKD                AKi sec to Cardio renal/ Diuresis                CKD stage 5 .               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 Secondary Hyperparathyroidism  present . Phosphorus not at goal.    On Binders Vitamin 25-OH low,on replacement  5)CHF PMD following  6)Electrolytes Hypokalemic    Sec to Diuretics    Now better  Normonatremic   7)Acid base Co2 at goal     Plan:  Will  change Diuretics to PO . Will follow bmet . Educated pt about need to stay  another 24-48 hrs to see the creat trend.      Sabrina Beck 12/24/2013, 10:23 AM

## 2013-12-25 DIAGNOSIS — Z5181 Encounter for therapeutic drug level monitoring: Secondary | ICD-10-CM

## 2013-12-25 DIAGNOSIS — I5033 Acute on chronic diastolic (congestive) heart failure: Principal | ICD-10-CM

## 2013-12-25 DIAGNOSIS — I5032 Chronic diastolic (congestive) heart failure: Secondary | ICD-10-CM | POA: Diagnosis present

## 2013-12-25 LAB — BASIC METABOLIC PANEL
BUN: 99 mg/dL — ABNORMAL HIGH (ref 6–23)
CALCIUM: 9.2 mg/dL (ref 8.4–10.5)
CO2: 23 mEq/L (ref 19–32)
Chloride: 95 mEq/L — ABNORMAL LOW (ref 96–112)
Creatinine, Ser: 6.99 mg/dL — ABNORMAL HIGH (ref 0.50–1.10)
GFR calc Af Amer: 6 mL/min — ABNORMAL LOW (ref >90)
GFR, EST NON AFRICAN AMERICAN: 5 mL/min — AB (ref >90)
Glucose, Bld: 127 mg/dL — ABNORMAL HIGH (ref 70–99)
Potassium: 3.9 mEq/L (ref 3.7–5.3)
Sodium: 139 mEq/L (ref 137–147)

## 2013-12-25 LAB — PROTIME-INR
INR: 2.55 — AB (ref 0.00–1.49)
Prothrombin Time: 26.6 seconds — ABNORMAL HIGH (ref 11.6–15.2)

## 2013-12-25 MED ORDER — DILTIAZEM HCL ER COATED BEADS 240 MG PO CP24: 240.0000 mg | ORAL_CAPSULE | Freq: Every day | ORAL | Status: DC

## 2013-12-25 MED ORDER — SODIUM BICARBONATE 650 MG PO TABS: 1300.0000 mg | ORAL_TABLET | Freq: Two times a day (BID) | ORAL | Status: DC

## 2013-12-25 MED ORDER — FUROSEMIDE 40 MG PO TABS: 40.0000 mg | ORAL_TABLET | Freq: Two times a day (BID) | ORAL | Status: DC

## 2013-12-25 MED ORDER — METOPROLOL TARTRATE 50 MG PO TABS: 50.0000 mg | ORAL_TABLET | Freq: Two times a day (BID) | ORAL | Status: DC

## 2013-12-25 MED ORDER — WARFARIN SODIUM 2.5 MG PO TABS: 2.5000 mg | ORAL_TABLET | Freq: Once | ORAL | Status: DC

## 2013-12-25 MED ORDER — DILTIAZEM HCL ER COATED BEADS 240 MG PO CP24
240.0000 mg | ORAL_CAPSULE | Freq: Every day | ORAL | Status: DC
  Administered 2013-12-25: 240 mg via ORAL
  Filled 2013-12-25: qty 1

## 2013-12-25 NOTE — Progress Notes (Signed)
Subjective:  Completes of weakness.  Objective:  Vital Signs in the last 24 hours: Temp:  [97.4 F (36.3 C)-98.1 F (36.7 C)] 97.8 F (36.6 C) (05/21 0516) Pulse Rate:  [93-105] 93 (05/21 0516) Resp:  [20] 20 (05/21 0516) BP: (106-121)/(65-76) 108/73 mmHg (05/21 0516) SpO2:  [94 %-98 %] 95 % (05/21 0516) Weight:  [204 lb 12.9 oz (92.9 kg)] 204 lb 12.9 oz (92.9 kg) (05/21 0516)  Intake/Output from previous day: 05/20 0701 - 05/21 0700 In: 703 [P.O.:700; I.V.:3] Out: 850 [Urine:850] Intake/Output from this shift: Total I/O In: 600 [P.O.:600] Out: -   Physical Exam: NECK: Without JVD, HJR, or bruit LUNGS: Clear anterior, posterior, lateral HEART: Regular rate and rhythm, no murmur, gallop, rub, bruit, thrill, or heave EXTREMITIES: Without cyanosis, clubbing, or edema   Lab Results:  Recent Labs  12/23/13 0552 12/24/13 0545  WBC 9.3 10.3  HGB 8.6* 9.4*  PLT 302 355    Recent Labs  12/24/13 0925 12/25/13 0600  NA 139 139  K 3.8 3.9  CL 96 95*  CO2 21 23  GLUCOSE 116* 127*  BUN 90* 99*  CREATININE 6.11* 6.99*   No results found for this basename: TROPONINI, CK, MB,  in the last 72 hours Hepatic Function Panel No results found for this basename: PROT, ALBUMIN, AST, ALT, ALKPHOS, BILITOT, BILIDIR, IBILI,  in the last 72 hours No results found for this basename: CHOL,  in the last 72 hours No results found for this basename: PROTIME,  in the last 72 hours  Imaging: Study Conclusions  - Procedure narrative: Transthoracic echocardiography. Image   quality was suboptimal due to poor sound transmission. - Left ventricle: The cavity size was normal. Wall thickness was   increased in a pattern of moderate LVH. Systolic function was   normal. The estimated ejection fraction was in the range of 50%   to 55%. The study was not technically sufficient to allow   evaluation of LV diastolic dysfunction due to atrial   fibrillation. - Regional wall motion abnormality:  Mild hypokinesis of the mid   inferoseptal and apical septal myocardium. - Aortic valve: Mildly calcified annulus. - Mitral valve: Mildly calcified annulus. Mildly thickened leaflets   . There was mild to moderate regurgitation. - Left atrium: The atrium was moderately dilated. - Tricuspid valve: There was mild regurgitation. Mildly elevated   pulmonary pressures (RVSP 38 mmHg).     Assessment/Plan:  Acute on chronic diastolic heart failure: diuresed and now compensated.  Chronic atrial fibrillation on Coumadin: heart rate still going up on increased metoprolol. Will increase Cardizem to 240mg  daily. Hopefully stable for discharge later today or in am and f/u with Dr. Sallyanne Kuster  Hypertension: Stable  End-stage renal disease impending hemodialysis, crt 6.99  Anemia secondary to chronic disease    LOS: 4 days    Imogene Burn 12/25/2013, 10:27 AM

## 2013-12-25 NOTE — Progress Notes (Signed)
[facility] PROGRESS NOTE  Sabrina Beck [id number] DOB: 06-17-1941 DOA: 12/21/2013 PCP: Glo Herring., MD  Assessment/Plan: Acute on chronic diastolic Congestive heart failure/fluid overload.   -Patient has been transitioned to oral Lasix , creatinine trending up today>> renal dosing her Lasix, follow -Continue monitoring strict I and os and daily weights>> patient is currently -1.6-7 L throughout this hospitalization. Current weight is 92.9 kg. -Appreciate renal assistance  -Continue metoprolol and Cardizem and statin. -Improving clinically - 2-D echo shows EF 50-55% with Mild hypokinesis of the mid inferoseptal and apical septal myocardium  and troponins negative x3.  chronic kidney disease stage IV/V, -Per renal she has no indications for emergent dialysis at this time. She has a left arm fistula which is maturing - Creatinine continuing to trending up  Dr Lowanda Foster assisting with diuresis, following. Patient has been transitioned to oral Lasix. Chronic atrial fibrillation  -Continues to be Tachycardic with minimal exertion even on increased metoprolol, and her BPs may not tolerate further up titration of her meds. Patient has been seen by cardiology and beta blocker has been increased to 50 twice a day. Patient's Cardizem has also been increased to 240 mg daily. Monitor heart rate. -Continue chronic anticoagulation with warfarin>> INR therapeutic, pharmacy following closely and dosing as appropriate Will need to followup with her cardiologist as outpatient. Hypertension.  -Okay control on outpatient meds. Anemia of chronic kidney disease -Hemoglobin stable, on aranesp per renal  Code Status: Full Family Communication: None at bedside Disposition Plan: To home when medically ready   Consultants:  Renal : Dr. Hinda Lenis 12/22/2013  Cardiology: Dr. Bronson Ing 12/24/2013  Procedures:  Echocardiogram Study Conclusions  - Procedure narrative: Transthoracic  echocardiography. Image quality was suboptimal due to poor sound transmission. - Left ventricle: The cavity size was normal. Wall thickness was increased in a pattern of moderate LVH. Systolic function was normal. The estimated ejection fraction was in the range of 50% to 55%. The study was not technically sufficient to allow evaluation of LV diastolic dysfunction due to atrial fibrillation. - Regional wall motion abnormality: Mild hypokinesis of the mid inferoseptal and apical septal myocardium. - Aortic valve: Mildly calcified annulus. - Mitral valve: Mildly calcified annulus. Mildly thickened leaflets . There was mild to moderate regurgitation. - Left atrium: The atrium was moderately dilated. - Tricuspid valve: There was mild regurgitation. Mildly elevated pulmonary pressures (RVSP 38 mmHg).   Antibiotics:  None  HPI/Subjective: Still states breathing improving, HR 120s this a.m. with minimal exertion-nonsustained.she denies chest pain. She denies nausea vomiting. Patient with no complaints. Patient wants to go home. Objective: Filed Vitals:   12/25/13 0516  BP: 108/73  Pulse: 93  Temp: 97.8 F (36.6 C)  Resp: 20    Intake/Output Summary (Last 24 hours) at 12/25/13 1104 Last data filed at 12/25/13 0939  Gross per 24 hour  Intake   1063 ml  Output    850 ml  Net    213 ml   Filed Weights   12/22/13 1707 12/24/13 0433 12/25/13 0516  Weight: 95.8 kg (211 lb 3.2 oz) 93.895 kg (207 lb) 92.9 kg (204 lb 12.9 oz)    Exam:  General: alert & oriented x 3 In NAD Cardiovascular: RRR, nl S1 s2 Respiratory: Decreased breath sounds at the bases, no crackles and no wheezes. Abdomen: soft +BS NT/ND, no masses palpable Extremities: No cyanosis and no edema    Data Reviewed: Basic Metabolic Panel:  Recent Labs Lab 12/21/13 1436 12/22/13 0235 12/23/13 [id number] 12/24/13 [id number]  12/25/13 0600  NA 142 140 142 139 139  K 3.4* 3.6* 3.1* 3.8 3.9  CL 107 104 101 96 95*  CO2 20  18* 19 21 23   GLUCOSE 105* 134* 181* 116* 127*  BUN 55* 55* 72* 90* 99*  CREATININE 4.73* 4.74* 5.42* 6.11* 6.99*  CALCIUM 8.6 8.8 9.2 9.5 9.2   Liver Function Tests:  Recent Labs Lab 12/22/13 0235  AST 32  ALT 27  ALKPHOS 37*  BILITOT 0.6  PROT 7.2  ALBUMIN 3.1*   No results found for this basename: LIPASE, AMYLASE,  in the last 168 hours No results found for this basename: AMMONIA,  in the last 168 hours CBC:  Recent Labs Lab 12/21/13 1436 12/22/13 0235 12/23/13 0552 12/24/13 0545  WBC 8.6 9.5 9.3 10.3  NEUTROABS 6.0  --   --   --   HGB 7.8* 7.8* 8.6* 9.4*  HCT 24.3* 24.6* 26.7* 28.7*  MCV 100.0 99.6 98.5 97.3  PLT 241 242 302 355   Cardiac Enzymes:  Recent Labs Lab 12/21/13 1436 12/21/13 2004 12/22/13 0235 12/22/13 0749  TROPONINI <0.30 <0.30 <0.30 <0.30   BNP (last 3 results)  Recent Labs  12/21/13 1436 12/22/13 0235  PROBNP 8395.0* 9327.0*   CBG: No results found for this basename: GLUCAP,  in the last 168 hours  No results found for this or any previous visit (from the past 240 hour(s)).   Studies: No results found.  Scheduled Meds: . calcitRIOL  0.25 mcg Oral Daily  . darbepoetin  100 mcg Subcutaneous Q Mon-1800  . diltiazem  240 mg Oral Daily  . fenofibrate  160 mg Oral Daily  . ferrous Q000111Q C-folic acid  1 capsule Oral Q breakfast  . furosemide  40 mg Oral BID  . metoprolol tartrate  50 mg Oral BID  . omega-3 acid ethyl esters  1 g Oral BID  . pantoprazole  40 mg Oral Daily  . pravastatin  40 mg Oral q1800  . sevelamer carbonate  800 mg Oral TID WC  . sodium bicarbonate  1,300 mg Oral BID  . sodium chloride  3 mL Intravenous Q12H  . Vitamin D (Ergocalciferol)  50,000 Units Oral Q7 days  . warfarin  2.5 mg Oral Once  . Warfarin - Pharmacist Dosing Inpatient   Does not apply q1800   Continuous Infusions:   Principal Problem:   Acute on chronic diastolic CHF (congestive heart failure) Active Problems:   Anemia    Atrial fibrillation   Long term (current) use of anticoagulants   CKD (chronic kidney disease) stage 5, GFR less than 15 ml/min   HTN (hypertension)   Dyspnea   CHF (congestive heart failure)    Time spent: 35 mins    Eugenie Filler MD [facility] Pager [phone]. If 7PM-7AM, please contact night-coverage at [email or web address], password [facility] 12/25/2013, 11:04 AM  LOS: 4 days

## 2013-12-25 NOTE — Progress Notes (Addendum)
Physical Therapy Treatment Patient Details Name: Sabrina Beck MRN: [id number] DOB: 06-06-1941 Today's Date: Jan 18, 2014    History of Present Illness 73yo female adm with 1wk hx of dyspnea, possible HF, baseline ESRD and awaiting dialysis; PMHx:  afib    PT Comments    Pt doing well today; seen for amb and to monitor HR with activity;  we have discussed energy conservation/exertion at home and pt verbalizes understanding; she has supportive son that can assist with needed, stays with her;  Follow Up Recommendations  No PT follow up     Equipment Recommendations  None recommended by PT    Recommendations for Other Services       Precautions / Restrictions Precautions Precautions: Other (comment) (HR) Precaution Comments: afib, incr HR    Mobility  Bed Mobility                  Transfers Overall transfer level: Modified independent Equipment used: None                Ambulation/Gait Ambulation/Gait assistance: Supervision;Min guard Ambulation Distance (Feet): 200 Feet Assistive device: 2 person hand held assist;1 person hand held assist Gait Pattern/deviations: Step-through pattern     General Gait Details:  pt feels slighty unsteady  and with mild sway right and left but no overt LOB   Stairs            Wheelchair Mobility    Modified Rankin (Stroke Patients Only)       Balance             Standing balance-Leahy Scale: Good                      Cognition Arousal/Alertness: Awake/alert Behavior During Therapy: WFL for tasks assessed/performed Overall Cognitive Status: Within Functional Limits for tasks assessed                      Exercises      General Comments        Pertinent Vitals/Pain HR 101 at rest  HR 113 during amb x 200' HR 90s  To 100 after amb, monitored for ~3 minutes  Nursing aware    Home Living                      Prior Function            PT Goals (current goals  can now be found in the care plan section) Acute Rehab PT Goals Patient Stated Goal: wants to go home today  PT Goal Formulation: With patient Time For Goal Achievement: 12/30/13 Potential to Achieve Goals: Good Progress towards PT goals: Progressing toward goals    Frequency  Min 3X/week    PT Plan Current plan remains appropriate    Co-evaluation             End of Session Equipment Utilized During Treatment: Gait belt Activity Tolerance: Patient tolerated treatment well Patient left: in chair;with call bell/phone within reach     Time: 1235-1248 PT Time Calculation (min): 13 min  Charges:  $Gait Training: 8-22 mins                    G Codes:      Neil Crouch 18-Jan-2014, 12:58 PM

## 2013-12-25 NOTE — Progress Notes (Signed)
The patient was seen and examined, and I agree with the assessment and plan as documented above, with modifications as noted below.  Pt with ESRD soon to be on HD admitted with what appears to be diastolic heart failure, EF 50-55% with moderate LVH and wall motion abnormality as described above.  She again appears to be euvolemic today. She has a h/o atrial fibrillation and HTN and is followed by Dr. Sallyanne Kuster.  Will continue metoprolol 50 mg bid today and increase long-acting diltiazem to 240 mg daily. If continues to remain stable, can likely go home later today. Will make a follow up appointment for her with Dr. Sallyanne Kuster for further adjustments.

## 2013-12-25 NOTE — Discharge Summary (Signed)
Physician Discharge Summary  Sabrina Beck [id number] DOB: [birth year] DOA: 12/21/2013  PCP: Glo Herring., MD  Admit date: 12/21/2013 Discharge date: 12/25/2013  Time spent: 70 minutes  Recommendations for Outpatient Follow-up:  1. Followup with Dr. Hinda Lenis of nephrology for Monday, 12/29/2013. On followup patient will need a renal panel to followup on electrolytes and renal function and further management of her chronic kidney disease. 2. Followup with Dr. Sallyanne Kuster of cardiology in 1 week. On followup patient's A. fib with RVR will need to be reassessed as well as her INR and had congestive heart failure. 3. Followup with Glo Herring., MD in 1-2 weeks.  Discharge Diagnoses:  Principal Problem:   Acute on chronic diastolic CHF (congestive heart failure) Active Problems:   Anemia   Atrial fibrillation   Long term (current) use of anticoagulants   CKD (chronic kidney disease) stage 5, GFR less than 15 ml/min   HTN (hypertension)   Dyspnea   CHF (congestive heart failure)   Discharge Condition: Stable and improved  Diet recommendation: Heart healthy/renal diet  Filed Weights   12/22/13 1707 12/24/13 0433 12/25/13 0516  Weight: 95.8 kg (211 lb 3.2 oz) 93.895 kg (207 lb) 92.9 kg (204 lb 12.9 oz)    History of present illness:  Sabrina Beck is a 73 y.o. female  This is a 73 year old lady, who has end-stage renal disease and is awaiting initiation of dialysis, who presents with one-week history of dyspnea on minimal exertion. She denies any chest pain but describes PND, orthopnea. She has had a fairly nonproductive cough. There is no fever. She thinks she may have also had swelling of her lower legs. Evaluation in the emergency room is suggestive of congestive heart failure/fluid overload and she is now being admitted for further management.      Hospital Course:  Acute on chronic diastolic Congestive heart failure/fluid overload.  Patient was admitted with  orthopnea and paroxysmal nocturnal dyspnea as well as shortness of breath on minimal exertion and noted to be in acute on chronic diastolic CHF exacerbation/fluid overload. Patient was initially placed on IV Lasix. Cardiac enzymes which was cycled were negative x3. Pro BNP on admission was elevated at 9327. Patient diuresed well. Patient was continued on a home regimen of metoprolol Cardizem and a statin. Nephrology was consulted as well as cardiology. 2-D echo which was obtained showed that you have a 50-55% with mild hypokinesis of the mid inferoseptal and apical septal myocardium. Patient improved clinically and was subsequently transitioned to oral Lasix. Patient be discharged home on oral Lasix and is to followup with cardiologist and nephrologist as outpatient. Patient was discharged in stable and improved condition. chronic kidney disease stage IV/V,  During the hospitalization patient renal function was noted to worsen her creatinine went for 4.74 to 6.99. Nephrology was consulted and followed the patient throughout the hospitalization. It was felt that patient's acute worsening of her creatinine was likely secondary to diureses. Patient was transitioned from IV Lasix to oral Lasix which she tolerated. Patient's shortness of breath improved. Patient did not have any signs or symptoms of uremia. -Per renal she has no indications for emergent dialysis at this time. She has a left arm fistula which is maturing. Patient was insistent on being discharged home. Patient was discussed with nephrologist Dr. Hinda Lenis who felt that the rising patient's creatinine was likely secondary to diuretics. It was felt that patient did not need any emergent dialysis and may be followed up closely as outpatient for repeat  labs. Patient be discharged in stable condition and will followup with Dr. Hinda Lenis on Monday, 12/29/2013. Chronic atrial fibrillation  -During the hospitalization patient had episodes where she continued  to be Tachycardic with minimal exertion even on increased metoprolol, and her BPs may not tolerate further up titration of her meds. Patient has been seen by cardiology and beta blocker was been increased to 50 twice a day. Patient's Cardizem has also been increased to 240 mg daily. Patient's heart rate improved and on ambulation her heart rate was 113. Per cardiology patient was stable for discharge to followup with her cardiologist as outpatient. Patient was maintained on her chronic anticoagulation with warfarin>> INR therapeutic on day of discharge. Will need to followup with her cardiologist as outpatient.  Hypertension.  -Patient's blood pressure remained stable on a home regimen. Patient's beta blocker dose and Cardizem dose was increased for better rate control. Patient will followup with PCP as outpatient.  Anemia of chronic kidney disease  -Hemoglobin remained stable, on aranesp per renal      Procedures: Echocardiogram Study Conclusions  - Procedure narrative: Transthoracic echocardiography. Image quality was suboptimal due to poor sound transmission. - Left ventricle: The cavity size was normal. Wall thickness was increased in a pattern of moderate LVH. Systolic function was normal. The estimated ejection fraction was in the range of 50% to 55%. The study was not technically sufficient to allow evaluation of LV diastolic dysfunction due to atrial fibrillation. - Regional wall motion abnormality: Mild hypokinesis of the mid inferoseptal and apical septal myocardium. - Aortic valve: Mildly calcified annulus. - Mitral valve: Mildly calcified annulus. Mildly thickened leaflets . There was mild to moderate regurgitation. - Left atrium: The atrium was moderately dilated. - Tricuspid valve: There was mild regurgitation. Mildly elevated pulmonary pressures (RVSP 38 mmHg).     Consultations: Renal : Dr. Hinda Lenis 12/22/2013  Cardiology: Dr. Bronson Ing 12/24/2013   Discharge  Exam: Filed Vitals:   12/25/13 1323  BP: 113/71  Pulse: 93  Temp: 98.3 F (36.8 C)  Resp: 20    General: NAD Cardiovascular: RRR Respiratory: CTAB  Discharge Instructions You were cared for by a hospitalist during your hospital stay. If you have any questions about your discharge medications or the care you received while you were in the hospital after you are discharged, you can call the unit and asked to speak with the hospitalist on call if the hospitalist that took care of you is not available. Once you are discharged, your primary care physician will handle any further medical issues. Please note that NO REFILLS for any discharge medications will be authorized once you are discharged, as it is imperative that you return to your primary care physician (or establish a relationship with a primary care physician if you do not have one) for your aftercare needs so that they can reassess your need for medications and monitor your lab values.  Discharge Instructions   Diet - low sodium heart healthy    Complete by:  As directed   Renal diet.     Discharge instructions    Complete by:  As directed   Follow up with Dr Hinda Lenis on Monday 12/29/13 Follow up with Dr Sallyanne Kuster in 1 week.     Increase activity slowly    Complete by:  As directed             Medication List    STOP taking these medications       azithromycin 250 MG tablet  Commonly  known as:  ZITHROMAX      TAKE these medications       acetaminophen 500 MG tablet  Commonly known as:  TYLENOL  Take 1,000 mg by mouth daily as needed for moderate pain.     calcitRIOL 0.25 MCG capsule  Commonly known as:  ROCALTROL  Take 0.25 mcg by mouth daily.     diltiazem 240 MG 24 hr capsule  Commonly known as:  CARDIZEM CD  Take 1 capsule (240 mg total) by mouth daily.     fenofibrate 145 MG tablet  Commonly known as:  TRICOR  TAKE ONE (1) TABLET BY MOUTH EVERY DAY     FERROCITE PLUS PO  Take 1 tablet by mouth daily.      Fish Oil 1200 MG Caps  Take 1 capsule by mouth 2 (two) times daily.     furosemide 40 MG tablet  Commonly known as:  LASIX  Take 1 tablet (40 mg total) by mouth 2 (two) times daily.     metoprolol 50 MG tablet  Commonly known as:  LOPRESSOR  Take 1 tablet (50 mg total) by mouth 2 (two) times daily.     omeprazole 20 MG capsule  Commonly known as:  PRILOSEC  Take 20 mg by mouth daily.     pravastatin 40 MG tablet  Commonly known as:  PRAVACHOL  Take 1 tablet (40 mg total) by mouth at bedtime.     sevelamer carbonate 800 MG tablet  Commonly known as:  RENVELA  Take 800 mg by mouth 3 (three) times daily with meals. And patient also takes wit snacks     sodium bicarbonate 650 MG tablet  Take 2 tablets (1,300 mg total) by mouth 2 (two) times daily.     VISION-VITE PRESERVE PO  Take 1 tablet by mouth 2 (two) times daily.     Vitamin D (Ergocalciferol) 50000 UNITS Caps capsule  Commonly known as:  DRISDOL  Take 50,000 Units by mouth every 7 (seven) days. Takes on Tuesday.     warfarin 5 MG tablet  Commonly known as:  COUMADIN  Take 2.5-5 mg by mouth daily. 1 tablet (5 mg) on Beck,tues and thurs and all other days take half tab (2.5 mg)       No Known Allergies     Follow-up Information   Follow up with [facility], MD. Schedule an appointment as soon as possible for a visit on 12/29/2013.   Specialty:  Nephrology   Contact information:   [address] [place] [address] [phone]       Follow up with CROITORU,MIHAI, MD. Schedule an appointment as soon as possible for a visit in 1 week.   Specialty:  Cardiology   Contact information:   [address] [place] [address] [phone]       Follow up with Glo Herring., MD. Schedule an appointment as soon as possible for a visit in 1 week. (f/u in 1-2 weeks)    Specialty:  Internal Medicine   Contact information:   [place]  [address] [phone]        The results of significant diagnostics from this hospitalization (including imaging, microbiology, ancillary and laboratory) are listed below for reference.    Significant Diagnostic Studies: Dg Chest 2 View  12/21/2013   CLINICAL DATA:  Shortness of breath.  EXAM: CHEST  2 VIEW  COMPARISON:  November 17, 2013  FINDINGS: The mediastinal contour is normal. The heart  size is enlarged. There is central pulmonary edema. There is no pleural effusion or focal pneumonia. The soft tissues and osseous structures are stable.  IMPRESSION: Mild congestive heart failure.   Electronically Signed   By: Abelardo Diesel M.D.   On: 12/21/2013 15:20    Microbiology: No results found for this or any previous visit (from the past 240 hour(s)).   Labs: Basic Metabolic Panel:  Recent Labs Lab 12/21/13 1436 12/22/13 0235 12/23/13 0552 12/24/13 0925 12/25/13 0600  NA 142 140 142 139 139  K 3.4* 3.6* 3.1* 3.8 3.9  CL 107 104 101 96 95*  CO2 20 18* 19 21 23   GLUCOSE 105* 134* 181* 116* 127*  BUN 55* 55* 72* 90* 99*  CREATININE 4.73* 4.74* 5.42* 6.11* 6.99*  CALCIUM 8.6 8.8 9.2 9.5 9.2   Liver Function Tests:  Recent Labs Lab 12/22/13 0235  AST 32  ALT 27  ALKPHOS 37*  BILITOT 0.6  PROT 7.2  ALBUMIN 3.1*   No results found for this basename: LIPASE, AMYLASE,  in the last 168 hours No results found for this basename: AMMONIA,  in the last 168 hours CBC:  Recent Labs Lab 12/21/13 1436 12/22/13 0235 12/23/13 0552 12/24/13 0545  WBC 8.6 9.5 9.3 10.3  NEUTROABS 6.0  --   --   --   HGB 7.8* 7.8* 8.6* 9.4*  HCT 24.3* 24.6* 26.7* 28.7*  MCV 100.0 99.6 98.5 97.3  PLT 241 242 302 355   Cardiac Enzymes:  Recent Labs Lab 12/21/13 1436 12/21/13 2004 12/22/13 0235 12/22/13 0749  TROPONINI <0.30 <0.30 <0.30 <0.30   BNP: BNP (last 3 results)  Recent Labs  12/21/13 1436 12/22/13 0235  PROBNP 8395.0* 9327.0*   CBG: No results found for this basename:  GLUCAP,  in the last 168 hours     Signed:  Eugenie Filler MD [facility] 12/25/2013, 3:34 PM

## 2013-12-25 NOTE — Progress Notes (Signed)
Subjective: Interval History: has no complaint of nausea or vomiting. Presently patient states that she's feeling better as far as difficulty breathing is concerned. Her appetite is good in she doesn't offer any complaints.  Objective: Vital signs in last 24 hours: Temp:  [97.4 F (36.3 C)-98.1 F (36.7 C)] 97.8 F (36.6 C) (05/21 0516) Pulse Rate:  [93-160] 93 (05/21 0516) Resp:  [20] 20 (05/21 0516) BP: (106-121)/(65-76) 108/73 mmHg (05/21 0516) SpO2:  [94 %-98 %] 95 % (05/21 0516) Weight:  [92.9 kg (204 lb 12.9 oz)] 92.9 kg (204 lb 12.9 oz) (05/21 0516) Weight change: -0.995 kg (-2 lb 3.1 oz)  Intake/Output from previous day: 05/20 0701 - 05/21 0700 In: 703 [P.O.:700; I.V.:3] Out: 850 [Urine:850] Intake/Output this shift:    General appearance: alert, cooperative and no distress Resp: clear to auscultation bilaterally Cardio: regular rate and rhythm, S1, S2 normal, no murmur, click, rub or gallop GI: soft, non-tender; bowel sounds normal; no masses,  no organomegaly Extremities: extremities normal, atraumatic, no cyanosis or edema  Lab Results:  Recent Labs  12/23/13 0552 12/24/13 0545  WBC 9.3 10.3  HGB 8.6* 9.4*  HCT 26.7* 28.7*  PLT 302 355   BMET:  Recent Labs  12/24/13 0925 12/25/13 0600  NA 139 139  K 3.8 3.9  CL 96 95*  CO2 21 23  GLUCOSE 116* 127*  BUN 90* 99*  CREATININE 6.11* 6.99*  CALCIUM 9.5 9.2   No results found for this basename: PTH,  in the last 72 hours Iron Studies: No results found for this basename: IRON, TIBC, TRANSFERRIN, FERRITIN,  in the last 72 hours  Studies/Results: No results found.  I have reviewed the patient's current medications.  Assessment/Plan: Problem #1 renal failure: This is chronic stage V. Presently her BUN and creatinine seems to be increasing. This is mostly secondary to diuretic use. Patient presently does not have any uremic sinus symptoms. She has left-sided fistula which is still premature to be used.  Her potassium is normal. Problem #2 history of difficulty breathing: This is thought to be secondary to CHF. Presently patient on diuretics with good urine output and she is feeling much better. Problem #3 anemia: Her hemoglobin and hematocrit is below her target goal. Presently seems to be stable. She is on ARANESP Problem #4 metabolic bone disease: Calcium is range however phosphorus is high. Presently patient is on a binder. Problem #5 history of a trial fibrillation: Her heart rate presently is controlled  problem #6 hypertension: Her blood pressure is well controlled. Problem #7 secondary hyperparathyroidism she is on Rocaltrol. Plan: We'll continue his present management Will follow her blood work. If her BUN and creatinine continued to increase and if patient becomes symptomatic we'll put a tunnel catheter and consider initiation of dialysis. This could be done as an outpatient if patient is going to discharge. We'll check her basic metabolic panel in the morning.   LOS: 4 days   Sabrina Beck 12/25/2013,8:06 AM

## 2013-12-25 NOTE — Progress Notes (Signed)
[place] for Coumadin Indication: atrial fibrillation  No Known Allergies  Patient Measurements: Height: 5' 3.5" (161.3 cm) Weight: 204 lb 12.9 oz (92.9 kg) IBW/kg (Calculated) : 53.55  Vital Signs: Temp: 97.8 F (36.6 C) (05/21 0516) Temp src: Oral (05/21 0516) BP: 108/73 mmHg (05/21 0516) Pulse Rate: 93 (05/21 0516)  Labs:  Recent Labs  12/23/13 0552 12/24/13 0545 12/24/13 0925 12/25/13 0600  HGB 8.6* 9.4*  --   --   HCT 26.7* 28.7*  --   --   PLT 302 355  --   --   LABPROT 33.1* 31.5*  --  26.6*  INR 3.40* 3.19*  --  2.55*  CREATININE 5.42*  --  6.11* 6.99*    Estimated Creatinine Clearance: 7.7 ml/min (by C-G formula based on Cr of 6.99).  Medical History: Past Medical History  Diagnosis Date  . Anemia 03/27/2011  . Atrial fibrillation   . Murmur 06/04/2006,09/29/10    2D Echo perform both test shows EF 55%  . Chest pain 05/15/2006    stress test perform negative for ischemia  . Systemic hypertension   . Mixed hyperlipidemia   . Cancer     uterine  . Complication of anesthesia   . PONV (postoperative nausea and vomiting)   . Dysrhythmia     afib  . Renal disorder   . Kidney disease     stage 4 chronic  . GERD (gastroesophageal reflux disease)   . Diverticulosis    Medications:  Scheduled:  . calcitRIOL  0.25 mcg Oral Daily  . darbepoetin  100 mcg Subcutaneous Q Mon-1800  . diltiazem  120 mg Oral Daily  . fenofibrate  160 mg Oral Daily  . ferrous [id number] C-folic acid  1 capsule Oral Q breakfast  . furosemide  40 mg Oral BID  . metoprolol tartrate  50 mg Oral BID  . omega-3 acid ethyl esters  1 g Oral BID  . pantoprazole  40 mg Oral Daily  . pravastatin  40 mg Oral q1800  . sevelamer carbonate  800 mg Oral TID WC  . sodium bicarbonate  1,300 mg Oral BID  . sodium chloride  3 mL Intravenous Q12H  . Vitamin D (Ergocalciferol)  50,000 Units Oral Q7 days  . Warfarin - Pharmacist Dosing  Inpatient   Does not apply q1800   Medications Prior to Admission  Medication Sig Dispense Refill  . acetaminophen (TYLENOL) 500 MG tablet Take 1,000 mg by mouth daily as needed for moderate pain.      Marland Kitchen azithromycin (ZITHROMAX) 250 MG tablet Take 1 tablet by mouth daily.      . calcitRIOL (ROCALTROL) 0.25 MCG capsule Take 0.25 mcg by mouth daily.      Marland Kitchen diltiazem (CARDIZEM CD) 120 MG 24 hr capsule Take 120 mg by mouth daily.      . Fe Fum-FA-B Cmp-C-Zn-Mg-Mn-Cu (FERROCITE PLUS PO) Take 1 tablet by mouth daily.       . fenofibrate (TRICOR) 145 MG tablet TAKE ONE (1) TABLET BY MOUTH EVERY DAY  30 tablet  11  . metoprolol tartrate (LOPRESSOR) 25 MG tablet TAKE ONE AND ONE-HALF TABLETS TWO TIMES DAILY.  90 tablet  11  . Multiple Vitamins-Minerals (VISION-VITE PRESERVE PO) Take 1 tablet by mouth 2 (two) times daily.       . Omega-3 Fatty Acids (FISH OIL) 1200 MG CAPS Take 1 capsule by mouth 2 (two) times daily.      Marland Kitchen omeprazole (PRILOSEC) 20  MG capsule Take 20 mg by mouth daily.      . pravastatin (PRAVACHOL) 40 MG tablet Take 1 tablet (40 mg total) by mouth at bedtime.  90 tablet  3  . sevelamer (RENVELA) 800 MG tablet Take 800 mg by mouth 3 (three) times daily with meals. And patient also takes wit snacks      . sodium bicarbonate 650 MG tablet Take 650 mg by mouth 3 (three) times daily.      . Vitamin D, Ergocalciferol, (DRISDOL) 50000 UNITS CAPS Take 50,000 Units by mouth every 7 (seven) days. Takes on Tuesday.      . warfarin (COUMADIN) 5 MG tablet Take 2.5-5 mg by mouth daily. 1 tablet (5 mg) on sun,tues and thurs and all other days take half tab (2.5 mg)       Assessment: 73 yo F on chronic warfarin for Afib.  Home dose listed above.  INR therapeutic on admission, however trended >3.  No Coumadin was given on 5/19 & dose decreased 5/21.  No bleeding noted.   Goal of Therapy:  INR 2-3   Plan:   Coumadin 2.5 mg po today x 1  INR daily  Sabrina Beck Sabrina Beck 12/25/2013,10:13  AM

## 2013-12-26 ENCOUNTER — Other Ambulatory Visit (HOSPITAL_COMMUNITY): Payer: Medicare HMO

## 2013-12-26 ENCOUNTER — Inpatient Hospital Stay (HOSPITAL_COMMUNITY): Admission: RE | Admit: 2013-12-26 | Payer: Medicare HMO | Source: Ambulatory Visit

## 2013-12-26 NOTE — Progress Notes (Signed)
UR chart review completed.  

## 2013-12-31 ENCOUNTER — Encounter: Payer: Self-pay | Admitting: Vascular Surgery

## 2014-01-01 ENCOUNTER — Encounter: Payer: Self-pay | Admitting: Vascular Surgery

## 2014-01-01 ENCOUNTER — Ambulatory Visit (INDEPENDENT_AMBULATORY_CARE_PROVIDER_SITE_OTHER): Payer: Self-pay | Admitting: Vascular Surgery

## 2014-01-01 ENCOUNTER — Ambulatory Visit (HOSPITAL_COMMUNITY)
Admission: RE | Admit: 2014-01-01 | Discharge: 2014-01-01 | Disposition: A | Discharge status: Home / Self Care | Payer: Medicare HMO | Source: Ambulatory Visit | Attending: Vascular Surgery | Admitting: Vascular Surgery

## 2014-01-01 VITALS — BP 136/70 | HR 81 | Ht 63.5 in | Wt 211.0 lb

## 2014-01-01 DIAGNOSIS — N186 End stage renal disease: Secondary | ICD-10-CM | POA: Insufficient documentation

## 2014-01-01 NOTE — Progress Notes (Signed)
Patient is a 84 to female who returns for followup today after creation of a left radiocephalic AV fistula on April 13. She is currently not on dialysis. She denies any numbness or tingling in her hand.  Data: Duplex ultrasound AV fistula was performed today. I reviewed and interpreted this study. The fistula is fairly uniform in diameter about 5 mm. There are several side branches. The fistula is 10 mm in depth.  Physical exam: Filed Vitals:   01/01/14 1556  BP: 136/70  Pulse: 81  Height: 5' 3.5" (1.613 m)  Weight: 211 lb (95.709 kg)  SpO2: 100%    Left upper extremity: Healed incision palpable thrill in fistula fistula is palpable throughout most of the forearm.  Assessment: Maturing AV fistula left arm.  Plan: We'll give the patient 3 more weeks to continue to develop the fistula with exercise. However, she may need to have superficialization of the fistula and side branch ligation. We will definitively decide at her next office visit in 3 weeks.  Ruta Hinds, MD [facility] Office: [phone] Pager: [phone]

## 2014-01-05 ENCOUNTER — Ambulatory Visit (INDEPENDENT_AMBULATORY_CARE_PROVIDER_SITE_OTHER): Payer: Medicare HMO | Admitting: Obstetrics and Gynecology

## 2014-01-05 ENCOUNTER — Encounter (HOSPITAL_COMMUNITY)
Admission: RE | Admit: 2014-01-05 | Discharge: 2014-01-05 | Disposition: A | Discharge status: Home / Self Care | Payer: Medicare HMO | Source: Ambulatory Visit | Attending: Nephrology | Admitting: Nephrology

## 2014-01-05 ENCOUNTER — Encounter: Payer: Self-pay | Admitting: Obstetrics and Gynecology

## 2014-01-05 VITALS — BP 128/66 | Ht 63.5 in | Wt 211.6 lb

## 2014-01-05 DIAGNOSIS — N611 Abscess of the breast and nipple: Secondary | ICD-10-CM

## 2014-01-05 DIAGNOSIS — N61 Mastitis without abscess: Secondary | ICD-10-CM

## 2014-01-05 DIAGNOSIS — D631 Anemia in chronic kidney disease: Secondary | ICD-10-CM | POA: Insufficient documentation

## 2014-01-05 DIAGNOSIS — N185 Chronic kidney disease, stage 5: Secondary | ICD-10-CM | POA: Insufficient documentation

## 2014-01-05 DIAGNOSIS — N039 Chronic nephritic syndrome with unspecified morphologic changes: Principal | ICD-10-CM

## 2014-01-05 LAB — RENAL FUNCTION PANEL
ALBUMIN: 3.4 g/dL — AB (ref 3.5–5.2)
BUN: 90 mg/dL — ABNORMAL HIGH (ref 6–23)
CO2: 21 mEq/L (ref 19–32)
CREATININE: 7.37 mg/dL — AB (ref 0.50–1.10)
Calcium: 8.2 mg/dL — ABNORMAL LOW (ref 8.4–10.5)
Chloride: 99 mEq/L (ref 96–112)
GFR calc Af Amer: 6 mL/min — ABNORMAL LOW (ref >90)
GFR calc non Af Amer: 5 mL/min — ABNORMAL LOW (ref >90)
Glucose, Bld: 184 mg/dL — ABNORMAL HIGH (ref 70–99)
Phosphorus: 7.6 mg/dL — ABNORMAL HIGH (ref 2.3–4.6)
Potassium: 3.9 mEq/L (ref 3.7–5.3)
Sodium: 142 mEq/L (ref 137–147)

## 2014-01-05 LAB — HEMOGLOBIN AND HEMATOCRIT, BLOOD
HCT: 28.1 % — ABNORMAL LOW (ref 36.0–46.0)
Hemoglobin: 9.2 g/dL — ABNORMAL LOW (ref 12.0–15.0)

## 2014-01-05 MED ORDER — CEPHALEXIN 500 MG PO CAPS: 500.0000 mg | ORAL_CAPSULE | Freq: Four times a day (QID) | ORAL | Status: DC

## 2014-01-05 MED ORDER — EPOETIN ALFA 10000 UNIT/ML IJ SOLN
16000.0000 [IU] | INTRAMUSCULAR | Status: DC
  Administered 2014-01-05: 16000 [IU] via SUBCUTANEOUS
  Filled 2014-01-05: qty 2

## 2014-01-05 NOTE — Progress Notes (Signed)
Patient ID: Sabrina Beck, female   DOB: 1941/04/20, 73 y.o.   MRN: [id number]  Chief Complaint  Patient presents with  . Breast Problem    rt breast tenderness    HPI Sabrina Beck is a 73 y.o. female.  With 3-4 days of severe right nipple pain and redness and swelling  HPI  Past Medical History  Diagnosis Date  . Anemia 03/27/2011  . Atrial fibrillation   . Murmur 06/04/2006,09/29/10    2D Echo perform both test shows EF 55%  . Chest pain 05/15/2006    stress test perform negative for ischemia  . Systemic hypertension   . Mixed hyperlipidemia   . Cancer     uterine  . Complication of anesthesia   . PONV (postoperative nausea and vomiting)   . Dysrhythmia     afib  . Renal disorder   . Kidney disease     stage 4 chronic  . GERD (gastroesophageal reflux disease)   . Diverticulosis   . CHF (congestive heart failure)     Past Surgical History  Procedure Laterality Date  . Cholecystectomy    . Abdominal hysterectomy    . Breast surgery Left     tumors non canerous 2- 3 different surgeries  . Knee arthroscopy    . Av fistula placement Left 11/17/2013    Procedure: CREATION OF LEFT RADIOCEPHALIC ARTERIOVENOUS (AV) FISTULA ;  Surgeon: [name], MD;  [facility];  Service: Vascular;  Laterality: Left;    Family History  Problem Relation Age of Onset  . Diabetes Mother   . Hypertension Mother   . Diabetes Father   . Heart disease Father     before age 60  . Heart attack Father   . Diabetes Sister   . Hyperlipidemia Sister   . Hypertension Sister   . Cancer Brother   . Diabetes Brother   . Heart disease Brother   . Hypertension Brother   . Heart attack Brother     Social History History  Substance Use Topics  . Smoking status: Former Smoker    Quit date: 08/08/1999  . Smokeless tobacco: Not on file  . Alcohol Use: No    No Known Allergies  Current Outpatient Prescriptions  Medication Sig Dispense Refill  . acetaminophen (TYLENOL) 500 MG  tablet Take 1,000 mg by mouth daily as needed for moderate pain.      . calcitRIOL (ROCALTROL) 0.25 MCG capsule Take 0.25 mcg by mouth daily.      Marland Kitchen diltiazem (CARDIZEM CD) 240 MG 24 hr capsule Take 1 capsule (240 mg total) by mouth daily.  30 capsule  0  . Fe Fum-FA-B Cmp-C-Zn-Mg-Mn-Cu (FERROCITE PLUS PO) Take 1 tablet by mouth daily.       . fenofibrate (TRICOR) 145 MG tablet TAKE ONE (1) TABLET BY MOUTH EVERY DAY  30 tablet  11  . furosemide (LASIX) 40 MG tablet Take 1 tablet (40 mg total) by mouth 2 (two) times daily.  62 tablet  0  . metoprolol (LOPRESSOR) 50 MG tablet Take 1 tablet (50 mg total) by mouth 2 (two) times daily.  62 tablet  0  . Multiple Vitamins-Minerals (VISION-VITE PRESERVE PO) Take 1 tablet by mouth 2 (two) times daily.       . Omega-3 Fatty Acids (FISH OIL) 1200 MG CAPS Take 1 capsule by mouth 2 (two) times daily.      Marland Kitchen omeprazole (PRILOSEC) 20 MG capsule Take 20 mg by mouth daily.      Marland Kitchen  pravastatin (PRAVACHOL) 40 MG tablet Take 1 tablet (40 mg total) by mouth at bedtime.  90 tablet  3  . sevelamer (RENVELA) 800 MG tablet Take 800 mg by mouth 3 (three) times daily with meals. And patient also takes wit snacks      . sodium bicarbonate 650 MG tablet Take 2 tablets (1,300 mg total) by mouth 2 (two) times daily.  120 tablet  0  . Vitamin D, Ergocalciferol, (DRISDOL) 50000 UNITS CAPS Take 50,000 Units by mouth every 7 (seven) days. Takes on Tuesday.      . warfarin (COUMADIN) 5 MG tablet Take 2.5-5 mg by mouth daily. 1 tablet (5 mg) on sun,tues and thurs and all other days take half tab (2.5 mg)       No current facility-administered medications for this visit.   Facility-Administered Medications Ordered in Other Visits  Medication Dose Route Frequency Provider Last Rate Last Dose  . epoetin alfa (EPOGEN,PROCRIT) injection 16,000 Units  16,000 Units Subcutaneous Q14 Days Harriett Sine, MD   16,000 Units at 01/05/14 1342    Review of Systems Review of Systems     Blood pressure 128/66, height 5' 3.5" (1.613 m), weight 211 lb 9.6 oz (95.981 kg).  Physical Exam Physical Exam  Pulmonary/Chest:  Right nipple swollen with very superficial 1 cm wide and 1.5 cm deep abscess, sprayed with local benzocaine, and lanced with 18 ga needle, expressing thick waxy purulent materiale and obtaining bleeding. Topical neosporin applied and bandaid.    Data Reviewed   Assessment    Breast nipple abscess, superficial      Plan    Lanced under topical anesthetic Rx [id number] qid x 7  D         Sabrina Beck 01/05/2014, 4:56 PM

## 2014-01-05 NOTE — Progress Notes (Signed)
Results for AMMARIE, RUND (MRN [id number]) as of 01/05/2014 14:29  Ref. Range 01/05/2014 13:10  Sodium Latest Range: 137-147 mEq/L 142  Potassium Latest Range: 3.7-5.3 mEq/L 3.9  Chloride Latest Range: 96-112 mEq/L 99  CO2 Latest Range: 19-32 mEq/L 21  BUN Latest Range: 6-23 mg/dL 90 (H)  Creatinine Latest Range: 0.50-1.10 mg/dL 7.37 (H)  Calcium Latest Range: 8.4-10.5 mg/dL 8.2 (L)  GFR calc non Af Amer Latest Range: >90 mL/min 5 (L)  GFR calc Af Amer Latest Range: >90 mL/min 6 (L)  Glucose Latest Range: 70-99 mg/dL 184 (H)  Phosphorus Latest Range: 2.3-4.6 mg/dL 7.6 (H)  Albumin Latest Range: 3.5-5.2 g/dL 3.4 (L)  Hemoglobin Latest Range: 12.0-15.0 g/dL 9.2 (L)  HCT Latest Range: 36.0-46.0 % 28.1 (L)  Procrit 16000 units administered in left & right lower abdomen, split doses.

## 2014-01-05 NOTE — Patient Instructions (Signed)
Take antibiotic 4x daily

## 2014-01-06 ENCOUNTER — Encounter: Payer: Self-pay | Admitting: Physician Assistant

## 2014-01-06 ENCOUNTER — Ambulatory Visit (INDEPENDENT_AMBULATORY_CARE_PROVIDER_SITE_OTHER): Payer: Medicare HMO | Admitting: Physician Assistant

## 2014-01-06 ENCOUNTER — Ambulatory Visit (INDEPENDENT_AMBULATORY_CARE_PROVIDER_SITE_OTHER): Payer: Medicare HMO | Admitting: *Deleted

## 2014-01-06 VITALS — BP 110/80 | HR 86 | Ht 63.0 in | Wt 210.0 lb

## 2014-01-06 DIAGNOSIS — I4891 Unspecified atrial fibrillation: Secondary | ICD-10-CM

## 2014-01-06 DIAGNOSIS — Z7901 Long term (current) use of anticoagulants: Secondary | ICD-10-CM

## 2014-01-06 DIAGNOSIS — Z5181 Encounter for therapeutic drug level monitoring: Secondary | ICD-10-CM

## 2014-01-06 DIAGNOSIS — I10 Essential (primary) hypertension: Secondary | ICD-10-CM

## 2014-01-06 DIAGNOSIS — I509 Heart failure, unspecified: Secondary | ICD-10-CM

## 2014-01-06 LAB — POCT INR: INR: 4.3

## 2014-01-06 MED ORDER — FUROSEMIDE 40 MG PO TABS: 40.0000 mg | ORAL_TABLET | Freq: Every day | ORAL | Status: DC

## 2014-01-06 NOTE — Assessment & Plan Note (Signed)
Rate is well-controlled at this time continue on Lopressor and Cardizem at current doses

## 2014-01-06 NOTE — Assessment & Plan Note (Signed)
She appears euvolemic. In going to decrease her Lasix to 40 mg daily. We discussed the daily weight monitoring and when to call the office for instructions. Her last serum creatinine has increased to 7.37 and she is due to see nephrology today.

## 2014-01-06 NOTE — Patient Instructions (Addendum)
1.  Decrease Lasix to 40 mg daily. 2.  Watch your weight every morning. If you gained 2 pounds in 24 hours or 5 pounds in a week, call our office for       instructions on taking additional Lasix.  3.  Also, if you notice your weight continues to decrease in a week's time, stopped taking the Lasix and call our office to      let us know. 4.  Hold Coumadin today and tomorrow. Restart at 2-1/2 mg daily and we will schedule a Coumadin check on Friday in      [place] 5. Follow up with Dr. Loletha Grayer. in 3 months

## 2014-01-06 NOTE — Assessment & Plan Note (Signed)
Blood pressure is well controlled on current therapy

## 2014-01-06 NOTE — Progress Notes (Signed)
Date:  01/06/2014   ID:  [name], DOB June 05, 1941, MRN [id number]  PCP:  Glo Herring., MD  Primary Cardiologist:  [place]    History of Present Illness: Sabrina Beck is a 73 y.o. female with a long-standing history of permanent atrial fibrillation systemic hypertension and hyperlipidemia but without coronary disease or major structural heart problems. She did not have evidence of left ventricular systolic or diastolic dysfunction by echo in 2012, although the left atrium was described as being mildly to moderately dilated. There no serious valvular abnormalities. She had a normal nuclear stress test in 2007 and has never had complaints of angina. She has been taking chronic warfarin anticoagulation for several years, without a history of bleeding complications and without a history of stroke, TIA or other embolic events.   She also has severe chronic kidney disease, now stage V with an estimated creatinine clearance of around 10 mL per minute and a serum creatinine that is usually around 4.0 mg/dL. She is preparing to have surgical implantation of arteriovenous fistula in the near future. She has thought about peritoneal dialysis but decided on hemodialysis in the end. She is ordered receiving erythropoietin injections periodically.  Patient was hospitalized from May 17 to 21 with congestive heart failure, A. fib RVR.  Echocardiogram on the 18th which showed an ejection fraction of 50-55%. Diastolic dysfunction was not assessed due to A. fib. Her metoprolol was increased to 50 mg twice daily.  Cardizem was also increased to 240 mg daily.  Patient appears to be doing well she has reduced the amount of salt she eats however she is not weighing herself on a daily basis. Discussed this. She has no complaints of lower extremity edema nor orthopnea or PND.  The patient currently denies nausea, vomiting, fever, chest pain, shortness of breath,  dizziness, cough, congestion, abdominal pain,  hematochezia, melena.  Wt Readings from Last 3 Encounters:  01/06/14 210 lb (95.255 kg)  01/05/14 211 lb 9.6 oz (95.981 kg)  01/05/14 211 lb (95.709 kg)     Past Medical History  Diagnosis Date  . Anemia 03/27/2011  . Atrial fibrillation   . Murmur 06/04/2006,09/29/10    2D Echo perform both test shows EF 55%  . Chest pain 05/15/2006    stress test perform negative for ischemia  . Systemic hypertension   . Mixed hyperlipidemia   . Cancer     uterine  . Complication of anesthesia   . PONV (postoperative nausea and vomiting)   . Dysrhythmia     afib  . Renal disorder   . Kidney disease     stage 4 chronic  . GERD (gastroesophageal reflux disease)   . Diverticulosis   . CHF (congestive heart failure)     Current Outpatient Prescriptions  Medication Sig Dispense Refill  . acetaminophen (TYLENOL) 500 MG tablet Take 1,000 mg by mouth daily as needed for moderate pain.      . calcitRIOL (ROCALTROL) 0.25 MCG capsule Take 0.25 mcg by mouth daily.      . cephALEXin (KEFLEX) 500 MG capsule Take 1 capsule (500 mg total) by mouth 4 (four) times daily.  28 capsule  0  . diltiazem (CARDIZEM CD) 240 MG 24 hr capsule Take 1 capsule (240 mg total) by mouth daily.  30 capsule  0  . Fe Fum-FA-B Cmp-C-Zn-Mg-Mn-Cu (FERROCITE PLUS PO) Take 1 tablet by mouth daily.       . fenofibrate (TRICOR) 145 MG tablet TAKE ONE (1) TABLET  BY MOUTH EVERY DAY  30 tablet  11  . furosemide (LASIX) 40 MG tablet Take 1 tablet (40 mg total) by mouth daily.  30 tablet  5  . metoprolol (LOPRESSOR) 50 MG tablet Take 1 tablet (50 mg total) by mouth 2 (two) times daily.  62 tablet  0  . Multiple Vitamins-Minerals (VISION-VITE PRESERVE PO) Take 1 tablet by mouth 2 (two) times daily.       . Omega-3 Fatty Acids (FISH OIL) 1200 MG CAPS Take 1 capsule by mouth 2 (two) times daily.      Marland Kitchen omeprazole (PRILOSEC) 20 MG capsule Take 20 mg by mouth daily.      . pravastatin (PRAVACHOL) 40 MG tablet Take 1 tablet (40 mg total) by  mouth at bedtime.  90 tablet  3  . sevelamer (RENVELA) 800 MG tablet Take 800 mg by mouth 3 (three) times daily with meals. And patient also takes wit snacks      . sodium bicarbonate 650 MG tablet Take 2 tablets (1,300 mg total) by mouth 2 (two) times daily.  120 tablet  0  . Vitamin D, Ergocalciferol, (DRISDOL) 50000 UNITS CAPS Take 50,000 Units by mouth every 7 (seven) days. Takes on Tuesday.      . warfarin (COUMADIN) 5 MG tablet Take 2.5-5 mg by mouth daily. 1 tablet (5 mg) on sun,tues and thurs and all other days take half tab (2.5 mg)       No current facility-administered medications for this visit.    Allergies:   No Known Allergies  Social History:  The patient  reports that she quit smoking about 14 years ago. She does not have any smokeless tobacco history on file. She reports that she does not drink alcohol or use illicit drugs.   Family history:   Family History  Problem Relation Age of Onset  . Diabetes Mother   . Hypertension Mother   . Diabetes Father   . Heart disease Father     before age 40  . Heart attack Father   . Diabetes Sister   . Hyperlipidemia Sister   . Hypertension Sister   . Cancer Brother   . Diabetes Brother   . Heart disease Brother   . Hypertension Brother   . Heart attack Brother     ROS:  Please see the history of present illness.  All other systems reviewed and negative.   PHYSICAL EXAM: VS:  BP 110/80  Pulse 86  Ht 5\' 3"  (1.6 m)  Wt 210 lb (95.255 kg)  BMI 37.21 kg/m2 Obese, well developed, in no acute distress HEENT: Pupils are equal round react to light accommodation extraocular movements are intact.  Neck: no JVDNo cervical lymphadenopathy. Cardiac: Irregular rate rhythm without murmurs rubs or gallops. Lungs:  clear to auscultation bilaterally, no wheezing, rhonchi or rales Abd: soft, nontender, positive bowel sounds all quadrants, no hepatosplenomegaly Ext: no lower extremity edema.  2+ radial and dorsalis pedis pulses. Skin:  warm and dry Neuro:  Grossly normal  EKG:   A. fib with a rate of 86  ASSESSMENT AND PLAN:  Problem List Items Addressed This Visit   Atrial fibrillation - Primary     Rate is well-controlled at this time continue on Lopressor and Cardizem at current doses    Relevant Medications      furosemide (LASIX) tablet   Other Relevant Orders      EKG 12-Lead   Long term (current) use of anticoagulants  Patient's INR today is 4.3. She'll hold Coumadin today and tomorrow and restart on Thursday 2.5 mg daily. We will recheck her Coumadin in [place] on Friday.    HTN (hypertension)      Blood pressure is well controlled on current therapy    Relevant Medications      furosemide (LASIX) tablet   CHF (congestive heart failure)     She appears euvolemic. In going to decrease her Lasix to 40 mg daily. We discussed the daily weight monitoring and when to call the office for instructions. Her last serum creatinine has increased to 7.37 and she is due to see nephrology today.    Relevant Medications      furosemide (LASIX) tablet

## 2014-01-06 NOTE — Assessment & Plan Note (Signed)
Patient's INR today is 4.3. She'll hold Coumadin today and tomorrow and restart on Thursday 2.5 mg daily. We will recheck her Coumadin in [place] on Friday.

## 2014-01-07 ENCOUNTER — Telehealth: Payer: Self-pay

## 2014-01-07 NOTE — Telephone Encounter (Signed)
Message copied by Denman George on Wed Jan 07, 2014  3:48 PM ------      Message from: Ruta Hinds E      Created: Wed Jan 07, 2014  1:14 PM      Regarding: RE: need some direction       If they want to try to stick it that is ok with me.  If they don't think they can access it she needs the below procedure done and a month to heal up after.            Charles      ----- Message -----         From: Sherrye Payor, RN         Sent: 01/06/2014   4:57 PM           To: [name], MD      Subject: need some direction                                      rec'd phone call from Dr. Rhona Leavens office.  This pt's. BUN/ CR are continuing to elevate.  The nurse said they would like to avoid placing a dialysis catheter if they can.  Per your last note of 5/28, you wanted to give it 3 more wks. to mature, and then may need to do superficialization and side br. Ligation.  The nurse asked how soon they could access the AVF, if you had to do the above procedure?       ------

## 2014-01-16 ENCOUNTER — Telehealth: Payer: Self-pay | Admitting: *Deleted

## 2014-01-16 NOTE — Telephone Encounter (Signed)
LMOM for pt to call and make coumadin appt.  Pt is past due for INR check.

## 2014-01-19 ENCOUNTER — Ambulatory Visit (INDEPENDENT_AMBULATORY_CARE_PROVIDER_SITE_OTHER): Payer: Medicare HMO | Admitting: Obstetrics and Gynecology

## 2014-01-19 ENCOUNTER — Ambulatory Visit: Payer: Medicare HMO | Admitting: Obstetrics and Gynecology

## 2014-01-19 ENCOUNTER — Encounter (HOSPITAL_COMMUNITY): Admission: RE | Admit: 2014-01-19 | Payer: Medicare HMO | Source: Ambulatory Visit

## 2014-01-19 ENCOUNTER — Encounter: Payer: Self-pay | Admitting: Obstetrics and Gynecology

## 2014-01-19 ENCOUNTER — Encounter (HOSPITAL_COMMUNITY): Payer: Medicare HMO

## 2014-01-19 VITALS — BP 130/90 | Ht 63.5 in | Wt 211.0 lb

## 2014-01-19 DIAGNOSIS — N61 Mastitis without abscess: Secondary | ICD-10-CM

## 2014-01-19 DIAGNOSIS — N611 Abscess of the breast and nipple: Secondary | ICD-10-CM | POA: Insufficient documentation

## 2014-01-19 DIAGNOSIS — Z5189 Encounter for other specified aftercare: Secondary | ICD-10-CM | POA: Insufficient documentation

## 2014-01-19 NOTE — Progress Notes (Signed)
This chart was scribed by Ludger Nutting, Medical Scribe, for Dr. Mallory Shirk on 01/19/14 at 4:18 PM. This chart was reviewed by Dr. Mallory Shirk for accuracy.   [facility] Visit  Patient name: Sabrina Beck MRN [id number]  Date of birth: [birth year]  CC & HPI:  Sabrina Beck is a 73 y.o. female presenting today for follow up after I&D. She denies any redness, swelling, or pain since the procedure.   ROS:  No fever, chills  Pertinent History Reviewed:   Reviewed: Significant for  Medical                                   Surgical Hx:   Medications: Reviewed & Updated - see associated section Social History: Reviewed -  reports that she quit smoking about 14 years ago. She does not have any smokeless tobacco history on file.  Objective Findings:  Vitals: Blood pressure 130/90, height 5' 3.5" (1.613 m), weight 211 lb (95.709 kg).  Physical Examination: General appearance - alert, well appearing, and in no distress Breasts - right breast normal without mass, skin or nipple changes or axillary nodes, left breast normal without mass, skin or nipple changes or axillary nodes she has essentially healed s/p I&D.   Assessment & Plan:   A: 1. Resolved right nipple abscess  P: 1. Follow up PRN

## 2014-01-22 ENCOUNTER — Ambulatory Visit (INDEPENDENT_AMBULATORY_CARE_PROVIDER_SITE_OTHER): Payer: Medicare HMO | Admitting: *Deleted

## 2014-01-22 DIAGNOSIS — Z5181 Encounter for therapeutic drug level monitoring: Secondary | ICD-10-CM

## 2014-01-22 DIAGNOSIS — I4891 Unspecified atrial fibrillation: Secondary | ICD-10-CM

## 2014-01-22 DIAGNOSIS — I482 Chronic atrial fibrillation, unspecified: Secondary | ICD-10-CM

## 2014-01-22 DIAGNOSIS — Z7901 Long term (current) use of anticoagulants: Secondary | ICD-10-CM

## 2014-01-22 LAB — POCT INR: INR: 1.7

## 2014-01-23 ENCOUNTER — Other Ambulatory Visit: Payer: Self-pay

## 2014-01-23 ENCOUNTER — Encounter (HOSPITAL_COMMUNITY): Payer: Self-pay | Admitting: *Deleted

## 2014-01-24 ENCOUNTER — Ambulatory Visit (HOSPITAL_COMMUNITY): Payer: Medicare HMO | Admitting: Anesthesiology

## 2014-01-24 ENCOUNTER — Ambulatory Visit (HOSPITAL_COMMUNITY): Payer: Medicare HMO

## 2014-01-24 ENCOUNTER — Encounter (HOSPITAL_COMMUNITY): Payer: Self-pay | Admitting: *Deleted

## 2014-01-24 ENCOUNTER — Encounter (HOSPITAL_COMMUNITY)
Admission: RE | Disposition: A | Discharge status: Home / Self Care | Payer: Self-pay | Source: Ambulatory Visit | Attending: Vascular Surgery

## 2014-01-24 ENCOUNTER — Ambulatory Visit (HOSPITAL_COMMUNITY)
Admission: RE | Admit: 2014-01-24 | Discharge: 2014-01-24 | Disposition: A | Discharge status: Home / Self Care | Payer: Medicare HMO | Source: Ambulatory Visit | Attending: Vascular Surgery | Admitting: Vascular Surgery

## 2014-01-24 ENCOUNTER — Encounter (HOSPITAL_COMMUNITY): Payer: Medicare HMO | Admitting: Anesthesiology

## 2014-01-24 DIAGNOSIS — I120 Hypertensive chronic kidney disease with stage 5 chronic kidney disease or end stage renal disease: Secondary | ICD-10-CM | POA: Insufficient documentation

## 2014-01-24 DIAGNOSIS — N186 End stage renal disease: Secondary | ICD-10-CM

## 2014-01-24 DIAGNOSIS — Z992 Dependence on renal dialysis: Secondary | ICD-10-CM | POA: Insufficient documentation

## 2014-01-24 DIAGNOSIS — Z87891 Personal history of nicotine dependence: Secondary | ICD-10-CM | POA: Insufficient documentation

## 2014-01-24 DIAGNOSIS — Z79899 Other long term (current) drug therapy: Secondary | ICD-10-CM | POA: Insufficient documentation

## 2014-01-24 DIAGNOSIS — T82898A Other specified complication of vascular prosthetic devices, implants and grafts, initial encounter: Secondary | ICD-10-CM

## 2014-01-24 DIAGNOSIS — Z7901 Long term (current) use of anticoagulants: Secondary | ICD-10-CM | POA: Insufficient documentation

## 2014-01-24 DIAGNOSIS — I4891 Unspecified atrial fibrillation: Secondary | ICD-10-CM | POA: Insufficient documentation

## 2014-01-24 DIAGNOSIS — I509 Heart failure, unspecified: Secondary | ICD-10-CM | POA: Insufficient documentation

## 2014-01-24 DIAGNOSIS — D649 Anemia, unspecified: Secondary | ICD-10-CM | POA: Insufficient documentation

## 2014-01-24 HISTORY — PX: INSERTION OF DIALYSIS CATHETER: SHX1324

## 2014-01-24 LAB — POCT I-STAT 4, (NA,K, GLUC, HGB,HCT)
Glucose, Bld: 106 mg/dL — ABNORMAL HIGH (ref 70–99)
HCT: 25 % — ABNORMAL LOW (ref 36.0–46.0)
Hemoglobin: 8.5 g/dL — ABNORMAL LOW (ref 12.0–15.0)
POTASSIUM: 3.6 meq/L — AB (ref 3.7–5.3)
Sodium: 141 mEq/L (ref 137–147)

## 2014-01-24 LAB — APTT: APTT: 33 s (ref 24–37)

## 2014-01-24 LAB — PROTIME-INR
INR: 2.03 — ABNORMAL HIGH (ref 0.00–1.49)
PROTHROMBIN TIME: 22.3 s — AB (ref 11.6–15.2)

## 2014-01-24 SURGERY — INSERTION OF DIALYSIS CATHETER
Anesthesia: Monitor Anesthesia Care | Site: Neck

## 2014-01-24 MED ORDER — SODIUM CHLORIDE 0.9 % IV SOLN
INTRAVENOUS | Status: DC | PRN
  Administered 2014-01-24: 07:00:00 via INTRAVENOUS

## 2014-01-24 MED ORDER — FENTANYL CITRATE 0.05 MG/ML IJ SOLN: 25.0000 ug | INTRAMUSCULAR | Status: DC | PRN

## 2014-01-24 MED ORDER — OXYCODONE HCL 5 MG/5ML PO SOLN
5.0000 mg | Freq: Once | ORAL | Status: DC | PRN
Start: 2014-01-24 — End: 2014-01-24

## 2014-01-24 MED ORDER — ONDANSETRON HCL 4 MG/2ML IJ SOLN
INTRAMUSCULAR | Status: AC
  Filled 2014-01-24: qty 2

## 2014-01-24 MED ORDER — ACETAMINOPHEN 160 MG/5ML PO SOLN
325.0000 mg | ORAL | Status: DC | PRN
  Filled 2014-01-24: qty 20.3

## 2014-01-24 MED ORDER — MIDAZOLAM HCL 2 MG/2ML IJ SOLN
INTRAMUSCULAR | Status: AC
  Filled 2014-01-24: qty 2

## 2014-01-24 MED ORDER — ACETAMINOPHEN 325 MG PO TABS: 325.0000 mg | ORAL_TABLET | ORAL | Status: DC | PRN

## 2014-01-24 MED ORDER — HEPARIN SODIUM (PORCINE) 1000 UNIT/ML IJ SOLN
INTRAMUSCULAR | Status: DC | PRN
  Administered 2014-01-24: 6000 [IU] via INTRAVENOUS

## 2014-01-24 MED ORDER — LIDOCAINE-EPINEPHRINE 0.5 %-1:200000 IJ SOLN
INTRAMUSCULAR | Status: AC
  Filled 2014-01-24: qty 1

## 2014-01-24 MED ORDER — HEPARIN SODIUM (PORCINE) 1000 UNIT/ML IJ SOLN
INTRAMUSCULAR | Status: AC
  Filled 2014-01-24: qty 1

## 2014-01-24 MED ORDER — FENTANYL CITRATE 0.05 MG/ML IJ SOLN
INTRAMUSCULAR | Status: AC
  Filled 2014-01-24: qty 5

## 2014-01-24 MED ORDER — PROPOFOL 10 MG/ML IV BOLUS
INTRAVENOUS | Status: AC
  Filled 2014-01-24: qty 20

## 2014-01-24 MED ORDER — SODIUM CHLORIDE 0.9 % IJ SOLN
INTRAMUSCULAR | Status: AC
  Filled 2014-01-24: qty 10

## 2014-01-24 MED ORDER — ONDANSETRON HCL 4 MG/2ML IJ SOLN
INTRAMUSCULAR | Status: DC | PRN
  Administered 2014-01-24: 4 mg via INTRAVENOUS

## 2014-01-24 MED ORDER — ROCURONIUM BROMIDE 50 MG/5ML IV SOLN
INTRAVENOUS | Status: AC
  Filled 2014-01-24: qty 1

## 2014-01-24 MED ORDER — SODIUM CHLORIDE 0.9 % IV SOLN: INTRAVENOUS | Status: DC

## 2014-01-24 MED ORDER — FENTANYL CITRATE 0.05 MG/ML IJ SOLN
INTRAMUSCULAR | Status: DC | PRN
  Administered 2014-01-24: 50 ug via INTRAVENOUS

## 2014-01-24 MED ORDER — OXYCODONE HCL 5 MG PO TABS: 5.0000 mg | ORAL_TABLET | Freq: Once | ORAL | Status: DC | PRN

## 2014-01-24 MED ORDER — MIDAZOLAM HCL 5 MG/5ML IJ SOLN
INTRAMUSCULAR | Status: DC | PRN
  Administered 2014-01-24: 1 mg via INTRAVENOUS

## 2014-01-24 MED ORDER — PROPOFOL INFUSION 10 MG/ML OPTIME
INTRAVENOUS | Status: DC | PRN
  Administered 2014-01-24: 50 ug/kg/min via INTRAVENOUS

## 2014-01-24 MED ORDER — ONDANSETRON HCL 4 MG/2ML IJ SOLN: 4.0000 mg | Freq: Once | INTRAMUSCULAR | Status: DC | PRN

## 2014-01-24 MED ORDER — LIDOCAINE-EPINEPHRINE 0.5 %-1:200000 IJ SOLN
INTRAMUSCULAR | Status: DC | PRN
  Administered 2014-01-24: 9 mL

## 2014-01-24 MED ORDER — CHLORHEXIDINE GLUCONATE CLOTH 2 % EX PADS: 6.0000 | MEDICATED_PAD | Freq: Once | CUTANEOUS | Status: DC

## 2014-01-24 MED ORDER — DEXTROSE 5 % IV SOLN
1.5000 g | INTRAVENOUS | Status: AC
  Administered 2014-01-24: 1.5 g via INTRAVENOUS
  Filled 2014-01-24: qty 1.5

## 2014-01-24 MED ORDER — 0.9 % SODIUM CHLORIDE (POUR BTL) OPTIME
TOPICAL | Status: DC | PRN
  Administered 2014-01-24: 1000 mL

## 2014-01-24 MED ORDER — SUCCINYLCHOLINE CHLORIDE 20 MG/ML IJ SOLN
INTRAMUSCULAR | Status: AC
  Filled 2014-01-24: qty 1

## 2014-01-24 MED ORDER — EPHEDRINE SULFATE 50 MG/ML IJ SOLN
INTRAMUSCULAR | Status: AC
  Filled 2014-01-24: qty 1

## 2014-01-24 MED ORDER — SODIUM CHLORIDE 0.9 % IR SOLN
Status: DC | PRN
  Administered 2014-01-24: 08:00:00

## 2014-01-24 SURGICAL SUPPLY — 43 items
BAG DECANTER FOR FLEXI CONT (MISCELLANEOUS) ×2 IMPLANT
BLADE 10 SAFETY STRL DISP (BLADE) ×2 IMPLANT
CATH CANNON HEMO 15F 50CM (CATHETERS) IMPLANT
CATH CANNON HEMO 15FR 19 (HEMODIALYSIS SUPPLIES) ×1 IMPLANT
CATH CANNON HEMO 15FR 23CM (HEMODIALYSIS SUPPLIES) IMPLANT
CATH CANNON HEMO 15FR 31CM (HEMODIALYSIS SUPPLIES) IMPLANT
CATH CANNON HEMO 15FR 32 (HEMODIALYSIS SUPPLIES) IMPLANT
CATH CANNON HEMO 15FR 32CM (HEMODIALYSIS SUPPLIES) IMPLANT
CLSR STERI-STRIP ANTIMIC 1/2X4 (GAUZE/BANDAGES/DRESSINGS) ×1 IMPLANT
COVER PROBE W GEL 5X96 (DRAPES) IMPLANT
COVER SURGICAL LIGHT HANDLE (MISCELLANEOUS) ×2 IMPLANT
DECANTER SPIKE VIAL GLASS SM (MISCELLANEOUS) ×2 IMPLANT
DRAPE C-ARM 42X72 X-RAY (DRAPES) ×2 IMPLANT
DRAPE CHEST BREAST 15X10 FENES (DRAPES) ×2 IMPLANT
DRSG TEGADERM 4X4.75 (GAUZE/BANDAGES/DRESSINGS) ×1 IMPLANT
GAUZE SPONGE 2X2 8PLY NS (GAUZE/BANDAGES/DRESSINGS) ×1 IMPLANT
GAUZE SPONGE 2X2 8PLY STRL LF (GAUZE/BANDAGES/DRESSINGS) ×1 IMPLANT
GAUZE SPONGE 4X4 16PLY XRAY LF (GAUZE/BANDAGES/DRESSINGS) ×2 IMPLANT
GLOVE SS BIOGEL STRL SZ 7.5 (GLOVE) ×1 IMPLANT
GLOVE SUPERSENSE BIOGEL SZ 7.5 (GLOVE) ×1
GOWN STRL REUS W/ TWL LRG LVL3 (GOWN DISPOSABLE) ×2 IMPLANT
GOWN STRL REUS W/TWL LRG LVL3 (GOWN DISPOSABLE) ×4
KIT BASIN OR (CUSTOM PROCEDURE TRAY) ×2 IMPLANT
KIT ROOM TURNOVER OR (KITS) ×2 IMPLANT
NDL 18GX1X1/2 (RX/OR ONLY) (NEEDLE) ×1 IMPLANT
NDL HYPO 25GX1X1/2 BEV (NEEDLE) ×1 IMPLANT
NEEDLE 18GX1X1/2 (RX/OR ONLY) (NEEDLE) ×2 IMPLANT
NEEDLE 22X1 1/2 (OR ONLY) (NEEDLE) ×2 IMPLANT
NEEDLE HYPO 25GX1X1/2 BEV (NEEDLE) ×2 IMPLANT
NS IRRIG 1000ML POUR BTL (IV SOLUTION) ×2 IMPLANT
PACK SURGICAL SETUP 50X90 (CUSTOM PROCEDURE TRAY) ×2 IMPLANT
PAD ARMBOARD 7.5X6 YLW CONV (MISCELLANEOUS) ×4 IMPLANT
SOAP 2 % CHG 4 OZ (WOUND CARE) ×2 IMPLANT
SPONGE GAUZE 2X2 STER 10/PKG (GAUZE/BANDAGES/DRESSINGS) ×1
SUT ETHILON 3 0 PS 1 (SUTURE) ×2 IMPLANT
SUT VICRYL 4-0 PS2 18IN ABS (SUTURE) ×2 IMPLANT
SYR 20CC LL (SYRINGE) ×2 IMPLANT
SYR 5ML LL (SYRINGE) ×4 IMPLANT
SYR CONTROL 10ML LL (SYRINGE) ×2 IMPLANT
SYRINGE 10CC LL (SYRINGE) ×2 IMPLANT
TOWEL OR 17X24 6PK STRL BLUE (TOWEL DISPOSABLE) ×2 IMPLANT
TOWEL OR 17X26 10 PK STRL BLUE (TOWEL DISPOSABLE) ×2 IMPLANT
WATER STERILE IRR 1000ML POUR (IV SOLUTION) ×2 IMPLANT

## 2014-01-24 NOTE — Interval H&P Note (Signed)
History and Physical Interval Note:  01/24/2014 7:26 AM  Sabrina Beck  has presented today for surgery, with the diagnosis of End Stage Renal Disease  The various methods of treatment have been discussed with the patient and family. After consideration of risks, benefits and other options for treatment, the patient has consented to  Procedure(s): INSERTION OF DIALYSIS CATHETER (N/A) as a surgical intervention .  The patient's history has been reviewed, patient examined, no change in status, stable for surgery.  I have reviewed the patient's chart and labs.  Questions were answered to the patient's satisfaction.   The patient had access of her left wrist AV fistula. She had been seen by Dr. Oneida Alar with the feeling that she may require superficial is a shunt side branch ligation. She did go on to hemodialysis requirements and had successful 2 accesses but then had infiltration. She'll have a catheter placed today and followup with Dr. fields in 2-3 weeks  EARLY, TODD

## 2014-01-24 NOTE — Transfer of Care (Signed)
Immediate Anesthesia Transfer of Care Note  Patient: Sabrina Beck  Procedure(s) Performed: Procedure(s): INSERTION OF DIALYSIS CATHETER (N/A)  Patient Location: PACU  Anesthesia Type:MAC  Level of Consciousness: awake, alert  and oriented  Airway & Oxygen Therapy: Patient Spontanous Breathing  Post-op Assessment: Report given to PACU RN and Post -op Vital signs reviewed and stable  Post vital signs: Reviewed and stable  Complications: No apparent anesthesia complications

## 2014-01-24 NOTE — Anesthesia Procedure Notes (Signed)
Procedure Name: MAC Date/Time: 01/24/2014 7:45 AM Performed by: Susa Loffler Pre-anesthesia Checklist: Patient identified, Emergency Drugs available, Suction available, Patient being monitored and Timeout performed Patient Re-evaluated:Patient Re-evaluated prior to inductionOxygen Delivery Method: Simple face mask Dental Injury: Teeth and Oropharynx as per pre-operative assessment

## 2014-01-24 NOTE — Anesthesia Preprocedure Evaluation (Addendum)
Anesthesia Evaluation  Patient identified by MRN, date of birth, ID band Patient awake    Reviewed: Allergy & Precautions, H&P , NPO status , Patient's Chart, lab work & pertinent test results, reviewed documented beta blocker date and time   History of Anesthesia Complications (+) PONV and history of anesthetic complications  Airway Mallampati: II TM Distance: >3 FB Neck ROM: Full    Dental  (+) Teeth Intact, Dental Advisory Given   Pulmonary shortness of breath, neg sleep apnea, neg COPDneg recent URI, former smoker,  breath sounds clear to auscultation        Cardiovascular hypertension, Pt. on home beta blockers and Pt. on medications +CHF + dysrhythmias Atrial Fibrillation Rhythm:Regular     Neuro/Psych negative neurological ROS  negative psych ROS   GI/Hepatic Neg liver ROS, GERD-  Medicated and Controlled,  Endo/Other  neg diabetesMorbid obesity  Renal/GU CRFRenal diseaseLast dialysis wed no problems     Musculoskeletal negative musculoskeletal ROS (+)   Abdominal   Peds  Hematology  (+) anemia , Coumadin for afib, inr 2 today   Anesthesia Other Findings   Reproductive/Obstetrics                         Anesthesia Physical  Anesthesia Plan  ASA: III  Anesthesia Plan: MAC   Post-op Pain Management:    Induction: Intravenous  Airway Management Planned: Simple Face Mask  Additional Equipment: None  Intra-op Plan:   Post-operative Plan:   Informed Consent: I have reviewed the patients History and Physical, chart, labs and discussed the procedure including the risks, benefits and alternatives for the proposed anesthesia with the patient or authorized representative who has indicated his/her understanding and acceptance.   Dental advisory given  Plan Discussed with: CRNA, Anesthesiologist and Surgeon  Anesthesia Plan Comments:       Anesthesia Quick Evaluation

## 2014-01-24 NOTE — Op Note (Signed)
    OPERATIVE REPORT  DATE OF SURGERY: 01/24/2014  PATIENT: Sabrina Beck, 73 y.o. female MRN: [id number]  DOB: Jun 03, 1941  PRE-OPERATIVE DIAGNOSIS: End-stage renal disease with poorly functioning left arm AV fistula  POST-OPERATIVE DIAGNOSIS:  Same  PROCEDURE: Right IJ hemodialysis catheter with ultrasound visualization  SURGEON:  Curt Jews, M.D.  PHYSICIAN ASSISTANT: Nurse  ANESTHESIA:  Local with sedation  EBL: Minimal ml  Total I/O In: 200 [I.V.:200] Out: -   BLOOD ADMINISTERED: None  DRAINS: None  SPECIMEN: None  COUNTS CORRECT:  YES  PLAN OF CARE: PACU   PATIENT DISPOSITION:  PACU - hemodynamically stable  PROCEDURE DETAILS: The patient was taken up in place supervision an area the right and left neck were imaged with ultrasound and a widely patent jugular veins bilaterally. The patient was placed in Trendelenburg position and the right and left neck and chest were prepped and draped in usual sterile fashion. Using SonoSite ultrasound localization the right internal jugular vein was accessed at the basilar neck with an 18-gauge needle. A guidewire was passed down the level of the right atrium and this was confirmed with fluoroscopy. A dilator and peel-away sheath was passed over the guidewire and the dilator and peel and catheter removed. The 23 cm hemodialysis catheter was passed through the peel-away sheath which was removed. The tips of the catheter were placed in the distal right atrium. The catheter was brought to separate subcutaneous tunnel through a separate stab incision. A 2 lm ports were attached in both lung lumens flushed and aspirated easily and were locked with 1000 unit per cc heparin. The catheter secured to the skin with a 3-0 nylon stitch and the entry site was closed with a 4-0 subcuticular Vicryl stitch. Both ports were locked with 1000 unit per cc heparin and the catheter was dressed in the usual sterile fashion. The patient was transferred to the  recovery room where chest x-ray is pending   Curt Jews, M.D. 01/24/2014 8:19 AM

## 2014-01-24 NOTE — Anesthesia Postprocedure Evaluation (Signed)
  Anesthesia Post-op Note  Patient: Sabrina Beck  Procedure(s) Performed: Procedure(s): INSERTION OF DIALYSIS CATHETER (N/A)  Patient Location: PACU  Anesthesia Type:MAC  Level of Consciousness: awake and alert   Airway and Oxygen Therapy: Patient Spontanous Breathing  Post-op Pain: none  Post-op Assessment: Post-op Vital signs reviewed, Patient's Cardiovascular Status Stable, Respiratory Function Stable, Patent Airway, No signs of Nausea or vomiting and Pain level controlled  Post-op Vital Signs: Reviewed and stable  Last Vitals:  Filed Vitals:   01/24/14 0856  BP: 112/53  Pulse: 79  Temp:   Resp: 21    Complications: No apparent anesthesia complications

## 2014-01-24 NOTE — H&P (View-Only) (Signed)
Patient is a 38 to female who returns for followup today after creation of a left radiocephalic AV fistula on April 13. She is currently not on dialysis. She denies any numbness or tingling in her hand.  Data: Duplex ultrasound AV fistula was performed today. I reviewed and interpreted this study. The fistula is fairly uniform in diameter about 5 mm. There are several side branches. The fistula is 10 mm in depth.  Physical exam: Filed Vitals:   01/01/14 1556  BP: 136/70  Pulse: 81  Height: 5' 3.5" (1.613 m)  Weight: 211 lb (95.709 kg)  SpO2: 100%    Left upper extremity: Healed incision palpable thrill in fistula fistula is palpable throughout most of the forearm.  Assessment: Maturing AV fistula left arm.  Plan: We'll give the patient 3 more weeks to continue to develop the fistula with exercise. However, she may need to have superficialization of the fistula and side branch ligation. We will definitively decide at her next office visit in 3 weeks.  Ruta Hinds, MD [facility] Office: [phone] Pager: [phone]

## 2014-01-26 ENCOUNTER — Telehealth: Payer: Self-pay | Admitting: Vascular Surgery

## 2014-01-26 NOTE — Telephone Encounter (Signed)
Patient is aware of cancellation and rescheduled appt. dpm

## 2014-01-26 NOTE — Telephone Encounter (Signed)
Message copied by Gena Fray on Mon Jan 26, 2014  2:20 PM ------      Message from: [place], [place] K      Created: Mon Jan 26, 2014 10:44 AM      Regarding: Schedule                   ----- Message -----         From: Rosetta Posner, MD         Sent: 01/24/2014   8:16 AM           To: [facility] Charge Pool            The patient had a right IJ catheter placed on Saturday with ultrasound visualization. Please see Dr. fields in the opposite about 2-3 weeks. She probably has an appointment to see him in one week which is too early. She had infiltration of her new fistula. ------

## 2014-01-26 NOTE — Telephone Encounter (Signed)
Left message with pts spouse to have Sabrina Beck call Hinton Dyer re:  1) Cancellation of 06/25 appt 2) Scheduling of 07/23 @ 830am appt  Dpm

## 2014-01-27 ENCOUNTER — Encounter (HOSPITAL_COMMUNITY): Payer: Self-pay | Admitting: Vascular Surgery

## 2014-01-29 ENCOUNTER — Ambulatory Visit: Payer: Medicare HMO | Admitting: Vascular Surgery

## 2014-02-01 ENCOUNTER — Other Ambulatory Visit: Payer: Self-pay | Admitting: Cardiovascular Disease

## 2014-02-02 ENCOUNTER — Other Ambulatory Visit: Payer: Self-pay | Admitting: *Deleted

## 2014-02-02 MED ORDER — DILTIAZEM HCL ER COATED BEADS 240 MG PO CP24: 240.0000 mg | ORAL_CAPSULE | Freq: Every day | ORAL | Status: DC

## 2014-02-02 MED ORDER — METOPROLOL TARTRATE 50 MG PO TABS: 50.0000 mg | ORAL_TABLET | Freq: Two times a day (BID) | ORAL | Status: DC

## 2014-02-05 ENCOUNTER — Encounter: Payer: Self-pay | Admitting: *Deleted

## 2014-02-23 ENCOUNTER — Encounter: Payer: Self-pay | Admitting: Vascular Surgery

## 2014-02-24 ENCOUNTER — Encounter: Payer: Self-pay | Admitting: Vascular Surgery

## 2014-02-24 ENCOUNTER — Ambulatory Visit (INDEPENDENT_AMBULATORY_CARE_PROVIDER_SITE_OTHER): Payer: Medicare HMO | Admitting: Vascular Surgery

## 2014-02-24 VITALS — BP 127/63 | HR 102 | Resp 18 | Ht 63.5 in | Wt 212.0 lb

## 2014-02-24 DIAGNOSIS — N186 End stage renal disease: Secondary | ICD-10-CM

## 2014-02-24 NOTE — Progress Notes (Signed)
Patient is a 73 year old female who is status post placement of a left radiocephalic AV fistula April 2015. She had a Diatek catheter placed by Dr. Donnetta Hutching in June 20. She states that they started using the fistula this week with some success.  Physical exam:  Filed Vitals:   02/24/14 0856  BP: 127/63  Pulse: 102  Resp: 18  Height: 5' 3.5" (1.613 m)  Weight: 212 lb (96.163 kg)    Left upper extremity: Well-healed incisions palpable thrill in fistula fistula is palpable over the course of about 7 cm in the forearm  Assessment: Working fistula left arm Plan: Continued access fistula, if continued successful scheduled for removal of Diatek catheter in the near future.  Ruta Hinds, MD [facility] Office: [phone] Pager: [phone]

## 2014-02-26 ENCOUNTER — Encounter: Payer: Medicare HMO | Admitting: Vascular Surgery

## 2014-03-11 ENCOUNTER — Other Ambulatory Visit: Payer: Self-pay

## 2014-03-17 ENCOUNTER — Ambulatory Visit (HOSPITAL_COMMUNITY)
Admission: RE | Admit: 2014-03-17 | Discharge: 2014-03-17 | Disposition: A | Discharge status: Home / Self Care | Payer: Medicare HMO | Source: Ambulatory Visit | Attending: Surgery | Admitting: Surgery

## 2014-03-17 DIAGNOSIS — N186 End stage renal disease: Secondary | ICD-10-CM | POA: Diagnosis not present

## 2014-03-17 LAB — PROTIME-INR
INR: 1.15 (ref 0.00–1.49)
Prothrombin Time: 14.7 seconds (ref 11.6–15.2)

## 2014-03-17 MED ORDER — LIDOCAINE HCL (PF) 1 % IJ SOLN
INTRAMUSCULAR | Status: AC
  Filled 2014-03-17: qty 5

## 2014-03-17 NOTE — Progress Notes (Signed)
Right upper chest dressing clean, dry, intact. No subq air noted. Denies pain.

## 2014-03-17 NOTE — Progress Notes (Signed)
[facility] SHORT STAY H&P  CC: ESRD   HPI: Sabrina Beck is a 73 y.o. female who has been on HD through  functioning Hemodialysis access in the left upper extremity. They are here for HD catheter removal. Pt. denies signs of steal syndrome.  Past Medical History  Diagnosis Date  . Anemia 03/27/2011  . Atrial fibrillation   . Murmur 06/04/2006,09/29/10    2D Echo perform both test shows EF 55%  . Chest pain 05/15/2006    stress test perform negative for ischemia  . Systemic hypertension   . Mixed hyperlipidemia   . Cancer     uterine  . Complication of anesthesia   . PONV (postoperative nausea and vomiting)   . Dysrhythmia     afib  . Renal disorder   . Kidney disease     stage 4 chronic  . GERD (gastroesophageal reflux disease)   . Diverticulosis   . CHF (congestive heart failure)     Family Hx Family History  Problem Relation Age of Onset  . Diabetes Mother   . Hypertension Mother   . Diabetes Father   . Heart disease Father     before age 77  . Heart attack Father   . Diabetes Sister   . Hyperlipidemia Sister   . Hypertension Sister   . Cancer Brother   . Diabetes Brother   . Heart disease Brother   . Hypertension Brother   . Heart attack Brother     Social HX History  Substance Use Topics  . Smoking status: Former Smoker    Quit date: 08/08/1999  . Smokeless tobacco: Never Used  . Alcohol Use: No    Allergies No Known Allergies  Medications Current Outpatient Prescriptions  Medication Sig Dispense Refill  . acetaminophen (TYLENOL) 500 MG tablet Take 1,000 mg by mouth daily as needed for moderate pain.      . calcitRIOL (ROCALTROL) 0.25 MCG capsule Take 0.25 mcg by mouth daily.      . cephALEXin (KEFLEX) 500 MG capsule Take 1 capsule (500 mg total) by mouth 4 (four) times daily.  28 capsule  0  . diltiazem (CARDIZEM CD) 240 MG 24 hr capsule Take 1 capsule (240 mg total) by mouth daily.  30 capsule  6  . Fe Fum-FA-B  Cmp-C-Zn-Mg-Mn-Cu (FERROCITE PLUS PO) Take 1 tablet by mouth daily.       . fenofibrate (TRICOR) 145 MG tablet TAKE ONE (1) TABLET BY MOUTH EVERY DAY  30 tablet  11  . furosemide (LASIX) 40 MG tablet Take 1 tablet (40 mg total) by mouth daily.  30 tablet  5  . metoprolol (LOPRESSOR) 50 MG tablet Take 1 tablet (50 mg total) by mouth 2 (two) times daily.  60 tablet  6  . Multiple Vitamins-Minerals (VISION-VITE PRESERVE PO) Take 1 tablet by mouth 2 (two) times daily.       . Omega-3 Fatty Acids (FISH OIL) 1200 MG CAPS Take 1 capsule by mouth 2 (two) times daily.      Marland Kitchen omeprazole (PRILOSEC) 20 MG capsule Take 20 mg by mouth daily.      . pravastatin (PRAVACHOL) 40 MG tablet Take 1 tablet (40 mg total) by mouth at bedtime.  90 tablet  3  . sevelamer (RENVELA) 800 MG tablet Take 800 mg by mouth 3 (three) times daily with meals. And patient also takes wit snacks      . sodium bicarbonate 650 MG tablet Take 2 tablets (1,300  mg total) by mouth 2 (two) times daily.  120 tablet  0  . Vitamin D, Ergocalciferol, (DRISDOL) 50000 UNITS CAPS Take 50,000 Units by mouth every 7 (seven) days. Takes on Tuesday.      . warfarin (COUMADIN) 5 MG tablet Take 1 tablet by mouth daily or as directed by coumadin clinic  30 tablet  5   No current facility-administered medications for this encounter.    Labs COAG Lab Results  Component Value Date   INR 2.03* 01/24/2014   INR 1.7 01/22/2014   INR 4.3 01/06/2014   No results found for this basename: PTT    PHYSICAL EXAM  Filed Vitals:   03/17/14 1257  BP: 110/73  Pulse: 108  Temp: 97.5 F (36.4 C)  Resp: 20    General:  WDWN in NAD HENT: WNL Eyes: Pupils equal Pulmonary: normal non-labored breathing  Cardiac: RRR, Skin: normal, no cyanosis, jaundice, pallor or bruising Vascular Exam/Pulses: 2+  pulses in LEFT upper extremity. Extremities without ischemic changes, no Gangrene , no cellulitis; no open wounds;   There is a good thrill and good bruit in the  left AV fistula. Hand grip is 5/5 and sensation in digits is intact;   Impression: This is a 73 y.o. female who has a functioning HD access.  Plan: Removal of Right IJ HD catheter Harrington Jobe MAUREEN 03/17/2014 1:25 PM     [facility] Catheter Removal Procedure Note  Diagnosis: ESRD with Functioning AVF/AVGG  Plan:  Remove right diatek catheter  Consent signed:  yes Time out completed:  yes Coumadin:  Yes.  INR 1.5 PT/INR (if applicable):   Other labs:   Procedure: 1.  Sterile prepping and draping over catheter area 2. 0 ml 2% lidocaine plain instilled at removal site. 3.  right catheter removed in its entirety with cuff in tact. 4.  Complications: none  5. Tip of catheter sent for culture:  no   Patient tolerated procedure well:  yes Pressure held, no bleeding noted, dressing applied Instructions given to the pt regarding wound care and bleeding.  OtherLaurence Slate [facility] 03/17/2014 1:25 PM

## 2014-04-09 ENCOUNTER — Ambulatory Visit (INDEPENDENT_AMBULATORY_CARE_PROVIDER_SITE_OTHER): Payer: Medicare HMO | Admitting: Cardiovascular Disease

## 2014-04-09 ENCOUNTER — Encounter: Payer: Self-pay | Admitting: Cardiovascular Disease

## 2014-04-09 VITALS — BP 116/71 | HR 96 | Resp 20 | Ht 63.5 in | Wt 212.0 lb

## 2014-04-09 DIAGNOSIS — I5033 Acute on chronic diastolic (congestive) heart failure: Secondary | ICD-10-CM

## 2014-04-09 DIAGNOSIS — I150 Renovascular hypertension: Secondary | ICD-10-CM

## 2014-04-09 DIAGNOSIS — Z7901 Long term (current) use of anticoagulants: Secondary | ICD-10-CM

## 2014-04-09 DIAGNOSIS — I151 Hypertension secondary to other renal disorders: Secondary | ICD-10-CM

## 2014-04-09 DIAGNOSIS — N186 End stage renal disease: Secondary | ICD-10-CM

## 2014-04-09 DIAGNOSIS — I482 Chronic atrial fibrillation, unspecified: Secondary | ICD-10-CM

## 2014-04-09 DIAGNOSIS — E782 Mixed hyperlipidemia: Secondary | ICD-10-CM

## 2014-04-09 DIAGNOSIS — I509 Heart failure, unspecified: Secondary | ICD-10-CM

## 2014-04-09 DIAGNOSIS — I4891 Unspecified atrial fibrillation: Secondary | ICD-10-CM

## 2014-04-09 DIAGNOSIS — N2889 Other specified disorders of kidney and ureter: Secondary | ICD-10-CM

## 2014-04-09 MED ORDER — METOPROLOL TARTRATE 50 MG PO TABS: ORAL_TABLET | ORAL | Status: DC

## 2014-04-09 NOTE — Patient Instructions (Signed)
STOP Furosemide.  On dialysis days take 1/2 tablet of Metoprolol in the mornings (in place of a whole tablet).  Dr. Sallyanne Kuster recommends that you schedule a follow-up appointment in: One year.

## 2014-04-09 NOTE — Progress Notes (Signed)
Patient ID: Sabrina Beck, female   DOB: [birth year], 73 y.o.   MRN: [id number]     Reason for office visit atrial fibrillation  Sabrina Beck is a 73 year old woman with a long-standing history of permanent atrial fibrillation, systemic hypertension and hyperlipidemia, but without coronary disease or major structural heart problems. She did not have evidence of left ventricular systolic or diastolic dysfunction by echo in 2012, although the left atrium was described as being mildly to moderately dilated. There were no serious valvular abnormalities. She had a normal nuclear stress test in 2007 and has never had complaints of angina. She has been taking chronic warfarin anticoagulation for several years, without a history of bleeding complications and without a history of stroke, TIA or other embolic events.  She also has severe chronic kidney disease and began hemodialysis for end-stage renal disease via a left forearm AV fistula several months ago. She often becomes hypotensive towards the end of dialysis and feels a little weak and wobbly on the dialysis afternoons. She tells me that she always has to have her legs elevated during the second part of her dialysis because her blood pressure is low. Otherwise, she is asymptomatic from a cardiac standpoint. She has never been really aware of her arrhythmia. She had heart failure before initiation of dialysis. Currently she only makes urine at most once a day. She is still on a tiny dose of loop diuretic which is probably superfluous.  No Known Allergies  Current Outpatient Prescriptions  Medication Sig Dispense Refill  . acetaminophen (TYLENOL) 500 MG tablet Take 1,000 mg by mouth daily as needed for moderate pain.      . Fe Fum-FA-B Cmp-C-Zn-Mg-Mn-Cu (FERROCITE PLUS PO) Take 1 tablet by mouth daily.       . fenofibrate (TRICOR) 145 MG tablet TAKE ONE (1) TABLET BY MOUTH EVERY DAY  30 tablet  11  . metoprolol (LOPRESSOR) 50 MG tablet On dialysis days  take 1/2 tablet in the mornings.  60 tablet  6  . Multiple Vitamins-Minerals (VISION-VITE PRESERVE PO) Take 1 tablet by mouth 2 (two) times daily.       . Omega-3 Fatty Acids (FISH OIL) 1200 MG CAPS Take 1 capsule by mouth 2 (two) times daily.      Marland Kitchen omeprazole (PRILOSEC) 20 MG capsule Take 20 mg by mouth daily.      . pravastatin (PRAVACHOL) 40 MG tablet Take 1 tablet (40 mg total) by mouth at bedtime.  90 tablet  3  . sevelamer (RENVELA) 800 MG tablet Take 800 mg by mouth 3 (three) times daily with meals. And patient also takes wit snacks      . sodium bicarbonate 650 MG tablet Take 2 tablets (1,300 mg total) by mouth 2 (two) times daily.  120 tablet  0  . Vitamin D, Ergocalciferol, (DRISDOL) 50000 UNITS CAPS Take 50,000 Units by mouth every 7 (seven) days. Takes on Tuesday.      . warfarin (COUMADIN) 5 MG tablet Take 1 tablet by mouth daily or as directed by coumadin clinic  30 tablet  5   No current facility-administered medications for this visit.    Past Medical History  Diagnosis Date  . Anemia 03/27/2011  . Atrial fibrillation   . Murmur 06/04/2006,09/29/10    2D Echo perform both test shows EF 55%  . Chest pain 05/15/2006    stress test perform negative for ischemia  . Systemic hypertension   . Mixed hyperlipidemia   . Cancer  uterine  . Complication of anesthesia   . PONV (postoperative nausea and vomiting)   . Dysrhythmia     afib  . Renal disorder   . Kidney disease     stage 4 chronic  . GERD (gastroesophageal reflux disease)   . Diverticulosis   . CHF (congestive heart failure)     Past Surgical History  Procedure Laterality Date  . Cholecystectomy    . Abdominal hysterectomy    . Breast surgery Left     tumors non canerous 2- 3 different surgeries  . Knee arthroscopy    . Av fistula placement Left 11/17/2013    Procedure: CREATION OF LEFT RADIOCEPHALIC ARTERIOVENOUS (AV) FISTULA ;  Surgeon: [name], MD;  Location: [facility];  Service: Vascular;   Laterality: Left;  . Appendectomy    . Colonoscopy w/ biopsies and polypectomy    . Insertion of dialysis catheter N/A 01/24/2014    Procedure: INSERTION OF DIALYSIS CATHETER;  Surgeon: Rosetta Posner, MD;  Location: [facility];  Service: Vascular;  Laterality: N/A;    Family History  Problem Relation Age of Onset  . Diabetes Mother   . Hypertension Mother   . Diabetes Father   . Heart disease Father     before age 30  . Heart attack Father   . Diabetes Sister   . Hyperlipidemia Sister   . Hypertension Sister   . Cancer Brother   . Diabetes Brother   . Heart disease Brother   . Hypertension Brother   . Heart attack Brother     History   Social History  . Marital Status: Widowed    Spouse Name: N/A    Number of Children: N/A  . Years of Education: N/A   Occupational History  . Not on file.   Social History Main Topics  . Smoking status: Former Smoker    Quit date: 08/08/1999  . Smokeless tobacco: Never Used  . Alcohol Use: No  . Drug Use: No  . Sexual Activity: Not Currently   Other Topics Concern  . Not on file   Social History Narrative  . No narrative on file    Review of systems: The patient specifically denies any chest pain at rest or with exertion, dyspnea at rest or with exertion, orthopnea, paroxysmal nocturnal dyspnea, syncope, palpitations, focal neurological deficits, intermittent claudication, lower extremity edema, unexplained weight gain, cough, hemoptysis or wheezing.  The patient also denies abdominal pain, nausea, vomiting, dysphagia, diarrhea, constipation, polyuria, polydipsia, dysuria, hematuria, frequency, urgency, abnormal bleeding or bruising, fever, chills, unexpected weight changes, mood swings, change in skin or hair texture, change in voice quality, auditory or visual problems, allergic reactions or rashes, new musculoskeletal complaints other than usual "aches and pains".   PHYSICAL EXAM BP 116/71  Pulse 96  Resp 20  Ht 5' 3.5" (1.613  m)  Wt 212 lb (96.163 kg)  BMI 36.96 kg/m2  General: Alert, oriented x3, no distress Head: no evidence of trauma, PERRL, EOMI, no exophtalmos or lid lag, no myxedema, no xanthelasma; normal ears, nose and oropharynx Neck: normal jugular venous pulsations and no hepatojugular reflux; brisk carotid pulses without delay and no carotid bruits Chest: clear to auscultation, no signs of consolidation by percussion or palpation, normal fremitus, symmetrical and full respiratory excursions Cardiovascular: normal position and quality of the apical impulse, regular rhythm, normal first and second heart sounds, no murmurs, rubs or gallops Abdomen: no tenderness or distention, no masses by palpation, no abnormal pulsatility or  arterial bruits, normal bowel sounds, no hepatosplenomegaly Extremities: Left forearm AV fistula is well healed. It is pretty deep and I cannot feel a thrill but it has an excellent bruit. There is no clubbing, cyanosis or edema; 2+ radial, ulnar and brachial pulses bilaterally; 2+ right femoral, posterior tibial and dorsalis pedis pulses; 2+ left femoral, posterior tibial and dorsalis pedis pulses; no subclavian or femoral bruits Neurological: grossly nonfocal   EKG: atrial fibrillation, chronic QS V1-V2   Lipid Panel The most recent lipid profile available shows a total cholesterol 163, HDL of 18, LDL 79, triglycerides 330 . She is due to have another one repeated   BMET    Component Value Date/Time   NA 141 01/24/2014 0620   K 3.6* 01/24/2014 0620   CL 99 01/05/2014 1310   CO2 21 01/05/2014 1310   GLUCOSE 106* 01/24/2014 0620   BUN 90* 01/05/2014 1310   CREATININE 7.37* 01/05/2014 1310   CALCIUM 8.2* 01/05/2014 1310   CALCIUM 8.1* 01/11/2012 1039   GFRNONAA 5* 01/05/2014 1310   GFRAA 6* 01/05/2014 1310     ASSESSMENT AND PLAN HTN (hypertension)  Blood pressure control is good. Suspect she is hypotensive following dialysis. Have asked her to decrease the a.m. metoprolol dose to 25 mg  daily on Monday Wednesday and Friday, her dialysis days.  Atrial fibrillation  Rate control is appropriate as is her anticoagulation regimen. Asymptomatic. No history of embolic events.  End-stage renal disease   Mixed hyperlipidemia  It's time to repeat her lipid profile. This can be drawn with her next labs analysis  Diastolic heart failure, chronic No problems since dialysis initiation  Patient Instructions  STOP Furosemide.  On dialysis days take 1/2 tablet of Metoprolol in the mornings (in place of a whole tablet).  Dr. Sallyanne Kuster recommends that you schedule a follow-up appointment in: One year.      Meds ordered this encounter  Medications  . metoprolol (LOPRESSOR) 50 MG tablet    Sig: On dialysis days take 1/2 tablet in the mornings.    Dispense:  60 tablet    Refill:  Racine Iyana Topor, MD, [facility] [phone] office [phone] pager

## 2014-04-10 ENCOUNTER — Ambulatory Visit (HOSPITAL_COMMUNITY)
Admission: RE | Admit: 2014-04-10 | Discharge: 2014-04-10 | Disposition: A | Discharge status: Home / Self Care | Payer: Medicare HMO | Source: Ambulatory Visit | Attending: Nephrology | Admitting: Nephrology

## 2014-04-10 ENCOUNTER — Other Ambulatory Visit (HOSPITAL_COMMUNITY): Payer: Self-pay | Admitting: Nephrology

## 2014-04-10 DIAGNOSIS — N186 End stage renal disease: Secondary | ICD-10-CM

## 2014-04-28 ENCOUNTER — Encounter: Payer: Self-pay | Admitting: Vascular Surgery

## 2014-05-13 ENCOUNTER — Encounter: Payer: Self-pay | Admitting: Vascular Surgery

## 2014-05-14 ENCOUNTER — Ambulatory Visit (INDEPENDENT_AMBULATORY_CARE_PROVIDER_SITE_OTHER): Payer: Medicare HMO | Admitting: Vascular Surgery

## 2014-05-14 ENCOUNTER — Other Ambulatory Visit: Payer: Self-pay

## 2014-05-14 ENCOUNTER — Encounter: Payer: Self-pay | Admitting: Vascular Surgery

## 2014-05-14 ENCOUNTER — Encounter (HOSPITAL_COMMUNITY): Payer: Self-pay | Admitting: Pharmacy Technician

## 2014-05-14 VITALS — BP 128/91 | HR 93 | Ht 63.5 in | Wt 210.5 lb

## 2014-05-14 DIAGNOSIS — N186 End stage renal disease: Secondary | ICD-10-CM

## 2014-05-14 DIAGNOSIS — T82898A Other specified complication of vascular prosthetic devices, implants and grafts, initial encounter: Secondary | ICD-10-CM

## 2014-05-14 DIAGNOSIS — T829XXA Unspecified complication of cardiac and vascular prosthetic device, implant and graft, initial encounter: Secondary | ICD-10-CM

## 2014-05-14 NOTE — Progress Notes (Signed)
Patient is a 73 year old female who is status post placement of a left radiocephalic AV fistula April 2015. She had a Diatek catheter placed by Dr. Donnetta Hutching in June 20. She states that they started using the fistula with some success and her catheter was removed. There began to have more difficulty cannulating her fistula. A new tunneled dialysis catheter was placed by Dr. Augustin Coupe recently. She states that the catheter has been sluggish recently. She denies any numbness or tingling in her left hand. She is on Coumadin for atrial fibrillation. Her dialysis today is Monday Wednesday Friday. Recent fistulogram CD-ROM was sent by Dr. Maxie Better but I was unable to open and accessed these images. The patient states that the proximal aspect of the fistula has been angioplastied at least once. She is referred for possible Superficialization of the left arm AV fistula.  Review of systems: She denies shortness of breath. She denies chest pain.  Physical exam:  Filed Vitals:   05/14/14 0920  BP: 128/91  Pulse: 93  Height: 5' 3.5" (1.613 m)  Weight: 210 lb 8 oz (95.482 kg)  SpO2: 97%   Left upper extremity: Well-healed incisions palpable thrill in fistula fistula is palpable over the course of about 5 cm in the forearm  Assessment: Working fistula left arm previous duplex ultrasound showed the fistula was greater than a centimeter in depth there were several portions especially distally. Poorly functioning right-sided dialysis catheter.   Plan: The patient is scheduled for Superficialization of her left arm AV fistula on October 13. We will also replace her tunneled dialysis catheter at that time. She is on Coumadin facial fibrillation. We will stop this on October 9. I will get her to the hospital postoperatively so that we can restart her anticoagulation slowly a she will be at very high risk for hematoma after the dissection for Superficialization.  Ruta Hinds, MD [facility]  [place] Office: [phone] Pager: [phone]

## 2014-05-15 ENCOUNTER — Encounter (HOSPITAL_COMMUNITY): Payer: Self-pay | Admitting: *Deleted

## 2014-05-15 NOTE — Progress Notes (Signed)
05/15/14 1553  OBSTRUCTIVE SLEEP APNEA  Have you ever been diagnosed with sleep apnea through a sleep study? No  Do you snore loudly (loud enough to be heard through closed doors)?  0  Do you often feel tired, fatigued, or sleepy during the daytime? 1  Has anyone observed you stop breathing during your sleep? 0  Do you have, or are you being treated for high blood pressure? 1  BMI more than 35 kg/m2? 1  Age over 75 years old? 1  Gender: 0  Obstructive Sleep Apnea Score 4

## 2014-05-18 ENCOUNTER — Other Ambulatory Visit: Payer: Self-pay

## 2014-05-18 MED ORDER — DEXTROSE 5 % IV SOLN
1.5000 g | INTRAVENOUS | Status: AC
  Administered 2014-05-19: 1.5 g via INTRAVENOUS
  Filled 2014-05-18: qty 1.5

## 2014-05-18 NOTE — Progress Notes (Signed)
Patient called with time change. Already knew to arrive at 530.

## 2014-05-19 ENCOUNTER — Inpatient Hospital Stay (HOSPITAL_COMMUNITY)
Admission: RE | Admit: 2014-05-19 | Discharge: 2014-05-21 | DRG: 252 | Disposition: A | Discharge status: Home / Self Care | Payer: Medicare HMO | Source: Ambulatory Visit | Attending: Vascular Surgery | Admitting: Vascular Surgery

## 2014-05-19 ENCOUNTER — Encounter (HOSPITAL_COMMUNITY)
Admission: RE | Disposition: A | Discharge status: Home / Self Care | Payer: Self-pay | Source: Ambulatory Visit | Attending: Vascular Surgery

## 2014-05-19 ENCOUNTER — Encounter (HOSPITAL_COMMUNITY): Payer: Medicare HMO | Admitting: Certified Registered Nurse Anesthetist

## 2014-05-19 ENCOUNTER — Encounter (HOSPITAL_COMMUNITY): Payer: Self-pay | Admitting: *Deleted

## 2014-05-19 ENCOUNTER — Inpatient Hospital Stay (HOSPITAL_COMMUNITY): Payer: Medicare HMO | Admitting: Certified Registered Nurse Anesthetist

## 2014-05-19 DIAGNOSIS — I120 Hypertensive chronic kidney disease with stage 5 chronic kidney disease or end stage renal disease: Secondary | ICD-10-CM | POA: Diagnosis present

## 2014-05-19 DIAGNOSIS — E782 Mixed hyperlipidemia: Secondary | ICD-10-CM | POA: Diagnosis present

## 2014-05-19 DIAGNOSIS — Z87891 Personal history of nicotine dependence: Secondary | ICD-10-CM | POA: Diagnosis not present

## 2014-05-19 DIAGNOSIS — K219 Gastro-esophageal reflux disease without esophagitis: Secondary | ICD-10-CM | POA: Diagnosis present

## 2014-05-19 DIAGNOSIS — I509 Heart failure, unspecified: Secondary | ICD-10-CM | POA: Diagnosis present

## 2014-05-19 DIAGNOSIS — Z992 Dependence on renal dialysis: Secondary | ICD-10-CM | POA: Diagnosis not present

## 2014-05-19 DIAGNOSIS — Z7901 Long term (current) use of anticoagulants: Secondary | ICD-10-CM | POA: Diagnosis not present

## 2014-05-19 DIAGNOSIS — I4891 Unspecified atrial fibrillation: Secondary | ICD-10-CM | POA: Diagnosis present

## 2014-05-19 DIAGNOSIS — D649 Anemia, unspecified: Secondary | ICD-10-CM | POA: Diagnosis present

## 2014-05-19 DIAGNOSIS — T8249XA Other complication of vascular dialysis catheter, initial encounter: Principal | ICD-10-CM | POA: Diagnosis present

## 2014-05-19 DIAGNOSIS — Z8249 Family history of ischemic heart disease and other diseases of the circulatory system: Secondary | ICD-10-CM | POA: Diagnosis not present

## 2014-05-19 DIAGNOSIS — Z833 Family history of diabetes mellitus: Secondary | ICD-10-CM

## 2014-05-19 DIAGNOSIS — N186 End stage renal disease: Secondary | ICD-10-CM | POA: Diagnosis present

## 2014-05-19 DIAGNOSIS — R011 Cardiac murmur, unspecified: Secondary | ICD-10-CM | POA: Diagnosis present

## 2014-05-19 DIAGNOSIS — T82898A Other specified complication of vascular prosthetic devices, implants and grafts, initial encounter: Secondary | ICD-10-CM

## 2014-05-19 DIAGNOSIS — N2581 Secondary hyperparathyroidism of renal origin: Secondary | ICD-10-CM | POA: Diagnosis present

## 2014-05-19 HISTORY — PX: FISTULA SUPERFICIALIZATION: SHX6341

## 2014-05-19 HISTORY — DX: Headache, unspecified: R51.9

## 2014-05-19 HISTORY — DX: Headache: R51

## 2014-05-19 LAB — COMPREHENSIVE METABOLIC PANEL
ALT: 19 U/L (ref 0–35)
AST: 19 U/L (ref 0–37)
Albumin: 3.2 g/dL — ABNORMAL LOW (ref 3.5–5.2)
Alkaline Phosphatase: 43 U/L (ref 39–117)
Anion gap: 18 — ABNORMAL HIGH (ref 5–15)
BUN: 35 mg/dL — ABNORMAL HIGH (ref 6–23)
CO2: 25 meq/L (ref 19–32)
Calcium: 7.9 mg/dL — ABNORMAL LOW (ref 8.4–10.5)
Chloride: 99 mEq/L (ref 96–112)
Creatinine, Ser: 7.9 mg/dL — ABNORMAL HIGH (ref 0.50–1.10)
GFR calc Af Amer: 5 mL/min — ABNORMAL LOW (ref >90)
GFR calc non Af Amer: 4 mL/min — ABNORMAL LOW (ref >90)
Glucose, Bld: 108 mg/dL — ABNORMAL HIGH (ref 70–99)
Potassium: 4.8 mEq/L (ref 3.7–5.3)
SODIUM: 142 meq/L (ref 137–147)
TOTAL PROTEIN: 6.7 g/dL (ref 6.0–8.3)
Total Bilirubin: 0.6 mg/dL (ref 0.3–1.2)

## 2014-05-19 LAB — CBC
HEMATOCRIT: 37.9 % (ref 36.0–46.0)
Hemoglobin: 11.9 g/dL — ABNORMAL LOW (ref 12.0–15.0)
MCH: 32.2 pg (ref 26.0–34.0)
MCHC: 31.4 g/dL (ref 30.0–36.0)
MCV: 102.7 fL — ABNORMAL HIGH (ref 78.0–100.0)
Platelets: 204 10*3/uL (ref 150–400)
RBC: 3.69 MIL/uL — ABNORMAL LOW (ref 3.87–5.11)
RDW: 14.8 % (ref 11.5–15.5)
WBC: 4.4 10*3/uL (ref 4.0–10.5)

## 2014-05-19 LAB — POCT I-STAT 4, (NA,K, GLUC, HGB,HCT)
GLUCOSE: 102 mg/dL — AB (ref 70–99)
HEMATOCRIT: 38 % (ref 36.0–46.0)
Hemoglobin: 12.9 g/dL (ref 12.0–15.0)
Potassium: 3.9 mEq/L (ref 3.7–5.3)
Sodium: 138 mEq/L (ref 137–147)

## 2014-05-19 LAB — PROTIME-INR
INR: 1.1 (ref 0.00–1.49)
INR: 1.17 (ref 0.00–1.49)
PROTHROMBIN TIME: 14.2 s (ref 11.6–15.2)
Prothrombin Time: 14.9 seconds (ref 11.6–15.2)

## 2014-05-19 SURGERY — FISTULA SUPERFICIALIZATION
Anesthesia: Monitor Anesthesia Care | Site: Arm Lower | Laterality: Left

## 2014-05-19 MED ORDER — ONDANSETRON HCL 4 MG/2ML IJ SOLN: 4.0000 mg | Freq: Four times a day (QID) | INTRAMUSCULAR | Status: DC | PRN

## 2014-05-19 MED ORDER — VITAMIN D (ERGOCALCIFEROL) 1.25 MG (50000 UNIT) PO CAPS
50000.0000 [IU] | ORAL_CAPSULE | ORAL | Status: DC
  Administered 2014-05-19: 50000 [IU] via ORAL
  Filled 2014-05-19 (×2): qty 1

## 2014-05-19 MED ORDER — DEXAMETHASONE SODIUM PHOSPHATE 4 MG/ML IJ SOLN
INTRAMUSCULAR | Status: AC
  Filled 2014-05-19: qty 1

## 2014-05-19 MED ORDER — ACETAMINOPHEN 650 MG RE SUPP: 325.0000 mg | RECTAL | Status: DC | PRN

## 2014-05-19 MED ORDER — PROPOFOL 10 MG/ML IV BOLUS
INTRAVENOUS | Status: DC | PRN
  Administered 2014-05-19: 150 mg via INTRAVENOUS

## 2014-05-19 MED ORDER — ARTIFICIAL TEARS OP OINT
TOPICAL_OINTMENT | OPHTHALMIC | Status: AC
  Filled 2014-05-19: qty 3.5

## 2014-05-19 MED ORDER — METOPROLOL TARTRATE 1 MG/ML IV SOLN: 2.0000 mg | INTRAVENOUS | Status: DC | PRN

## 2014-05-19 MED ORDER — WARFARIN SODIUM 2.5 MG PO TABS
2.5000 mg | ORAL_TABLET | ORAL | Status: DC
  Administered 2014-05-19: 2.5 mg via ORAL
  Filled 2014-05-19 (×2): qty 1

## 2014-05-19 MED ORDER — ACETAMINOPHEN 500 MG PO TABS: 1000.0000 mg | ORAL_TABLET | Freq: Four times a day (QID) | ORAL | Status: DC | PRN

## 2014-05-19 MED ORDER — PANTOPRAZOLE SODIUM 40 MG PO TBEC: 40.0000 mg | DELAYED_RELEASE_TABLET | Freq: Every day | ORAL | Status: DC

## 2014-05-19 MED ORDER — ONDANSETRON HCL 4 MG/2ML IJ SOLN
INTRAMUSCULAR | Status: AC
  Filled 2014-05-19: qty 2

## 2014-05-19 MED ORDER — POLYVINYL ALCOHOL 1.4 % OP SOLN
1.0000 [drp] | OPHTHALMIC | Status: DC | PRN
  Filled 2014-05-19: qty 15

## 2014-05-19 MED ORDER — PROPYLENE GLYCOL 0.6 % OP SOLN: 1.0000 [drp] | OPHTHALMIC | Status: DC | PRN

## 2014-05-19 MED ORDER — DOXERCALCIFEROL 4 MCG/2ML IV SOLN
1.0000 ug | INTRAVENOUS | Status: DC
  Administered 2014-05-20: 1 ug via INTRAVENOUS
  Filled 2014-05-19: qty 2

## 2014-05-19 MED ORDER — LABETALOL HCL 5 MG/ML IV SOLN
10.0000 mg | INTRAVENOUS | Status: DC | PRN
  Filled 2014-05-19: qty 4

## 2014-05-19 MED ORDER — POTASSIUM CHLORIDE CRYS ER 20 MEQ PO TBCR: 20.0000 meq | EXTENDED_RELEASE_TABLET | Freq: Once | ORAL | Status: DC

## 2014-05-19 MED ORDER — ONDANSETRON HCL 4 MG/2ML IJ SOLN
INTRAMUSCULAR | Status: DC | PRN
  Administered 2014-05-19: 4 mg via INTRAVENOUS

## 2014-05-19 MED ORDER — HYDROMORPHONE HCL 1 MG/ML IJ SOLN: 0.2500 mg | INTRAMUSCULAR | Status: DC | PRN

## 2014-05-19 MED ORDER — HYDRALAZINE HCL 20 MG/ML IJ SOLN: 10.0000 mg | INTRAMUSCULAR | Status: DC | PRN

## 2014-05-19 MED ORDER — MIDAZOLAM HCL 5 MG/5ML IJ SOLN
INTRAMUSCULAR | Status: DC | PRN
  Administered 2014-05-19: 1 mg via INTRAVENOUS

## 2014-05-19 MED ORDER — WARFARIN - PHYSICIAN DOSING INPATIENT
Freq: Every day | Status: DC
  Administered 2014-05-19: 18:00:00

## 2014-05-19 MED ORDER — WARFARIN SODIUM 5 MG PO TABS: 5.0000 mg | ORAL_TABLET | ORAL | Status: DC

## 2014-05-19 MED ORDER — ONDANSETRON HCL 4 MG/2ML IJ SOLN: 4.0000 mg | Freq: Once | INTRAMUSCULAR | Status: DC | PRN

## 2014-05-19 MED ORDER — WARFARIN SODIUM 2.5 MG PO TABS: 2.5000 mg | ORAL_TABLET | Freq: Every day | ORAL | Status: DC

## 2014-05-19 MED ORDER — MORPHINE SULFATE 2 MG/ML IJ SOLN: 2.0000 mg | INTRAMUSCULAR | Status: DC | PRN

## 2014-05-19 MED ORDER — PROPRANOLOL HCL 1 MG/ML IV SOLN
INTRAVENOUS | Status: AC
  Filled 2014-05-19: qty 1

## 2014-05-19 MED ORDER — SODIUM CHLORIDE 0.9 % IJ SOLN
3.0000 mL | Freq: Two times a day (BID) | INTRAMUSCULAR | Status: DC
  Administered 2014-05-19 – 2014-05-20 (×2): 3 mL via INTRAVENOUS

## 2014-05-19 MED ORDER — FENOFIBRATE 160 MG PO TABS
160.0000 mg | ORAL_TABLET | Freq: Every day | ORAL | Status: DC
  Administered 2014-05-19 – 2014-05-20 (×2): 160 mg via ORAL
  Filled 2014-05-19 (×3): qty 1

## 2014-05-19 MED ORDER — OXYCODONE-ACETAMINOPHEN 5-325 MG PO TABS
1.0000 | ORAL_TABLET | ORAL | Status: DC | PRN
  Administered 2014-05-19: 1 via ORAL
  Filled 2014-05-19: qty 1

## 2014-05-19 MED ORDER — CHLORHEXIDINE GLUCONATE CLOTH 2 % EX PADS: 6.0000 | MEDICATED_PAD | Freq: Once | CUTANEOUS | Status: DC

## 2014-05-19 MED ORDER — PROPOFOL 10 MG/ML IV EMUL
INTRAVENOUS | Status: AC
  Filled 2014-05-19: qty 100

## 2014-05-19 MED ORDER — METOPROLOL TARTRATE 50 MG PO TABS
50.0000 mg | ORAL_TABLET | Freq: Two times a day (BID) | ORAL | Status: DC
Start: 2014-05-19 — End: 2014-05-21
  Administered 2014-05-19 – 2014-05-21 (×4): 50 mg via ORAL
  Filled 2014-05-19 (×7): qty 1

## 2014-05-19 MED ORDER — GUAIFENESIN-DM 100-10 MG/5ML PO SYRP: 15.0000 mL | ORAL_SOLUTION | ORAL | Status: DC | PRN

## 2014-05-19 MED ORDER — LIDOCAINE HCL (CARDIAC) 20 MG/ML IV SOLN
INTRAVENOUS | Status: DC | PRN
  Administered 2014-05-19: 100 mg via INTRAVENOUS

## 2014-05-19 MED ORDER — LIDOCAINE HCL (PF) 1 % IJ SOLN
INTRAMUSCULAR | Status: AC
  Filled 2014-05-19: qty 30

## 2014-05-19 MED ORDER — HEPARIN SODIUM (PORCINE) 5000 UNIT/ML IJ SOLN
INTRAMUSCULAR | Status: DC | PRN
  Administered 2014-05-19: 08:00:00

## 2014-05-19 MED ORDER — DEXTROSE 5 % IV SOLN
1.5000 g | INTRAVENOUS | Status: AC
  Administered 2014-05-20: 1.5 g via INTRAVENOUS
  Filled 2014-05-19: qty 1.5

## 2014-05-19 MED ORDER — MIDAZOLAM HCL 2 MG/2ML IJ SOLN
INTRAMUSCULAR | Status: AC
  Filled 2014-05-19: qty 2

## 2014-05-19 MED ORDER — SODIUM CHLORIDE 0.9 % IV SOLN: 250.0000 mL | INTRAVENOUS | Status: DC | PRN

## 2014-05-19 MED ORDER — SODIUM CHLORIDE 0.9 % IJ SOLN
INTRAMUSCULAR | Status: AC
  Filled 2014-05-19: qty 10

## 2014-05-19 MED ORDER — SODIUM CHLORIDE 0.9 % IJ SOLN: 3.0000 mL | INTRAMUSCULAR | Status: DC | PRN

## 2014-05-19 MED ORDER — ROCURONIUM BROMIDE 50 MG/5ML IV SOLN
INTRAVENOUS | Status: AC
  Filled 2014-05-19: qty 1

## 2014-05-19 MED ORDER — SODIUM CHLORIDE 0.9 % IV SOLN
INTRAVENOUS | Status: DC
  Administered 2014-05-19: 25 mL/h via INTRAVENOUS

## 2014-05-19 MED ORDER — DEXAMETHASONE SODIUM PHOSPHATE 4 MG/ML IJ SOLN
INTRAMUSCULAR | Status: DC | PRN
  Administered 2014-05-19: 4 mg via INTRAVENOUS

## 2014-05-19 MED ORDER — FENTANYL CITRATE 0.05 MG/ML IJ SOLN
INTRAMUSCULAR | Status: DC | PRN
  Administered 2014-05-19: 75 ug via INTRAVENOUS
  Administered 2014-05-19 (×2): 25 ug via INTRAVENOUS

## 2014-05-19 MED ORDER — 0.9 % SODIUM CHLORIDE (POUR BTL) OPTIME
TOPICAL | Status: DC | PRN
  Administered 2014-05-19: 1000 mL

## 2014-05-19 MED ORDER — SODIUM BICARBONATE 650 MG PO TABS
650.0000 mg | ORAL_TABLET | Freq: Three times a day (TID) | ORAL | Status: DC
  Administered 2014-05-19 – 2014-05-21 (×7): 650 mg via ORAL
  Filled 2014-05-19 (×9): qty 1

## 2014-05-19 MED ORDER — PHENYLEPHRINE HCL 10 MG/ML IJ SOLN
INTRAMUSCULAR | Status: DC | PRN
  Administered 2014-05-19 (×7): 80 ug via INTRAVENOUS

## 2014-05-19 MED ORDER — PHENOL 1.4 % MT LIQD
1.0000 | OROMUCOSAL | Status: DC | PRN
  Filled 2014-05-19: qty 177

## 2014-05-19 MED ORDER — PHENYLEPHRINE HCL 10 MG/ML IJ SOLN
10.0000 mg | INTRAVENOUS | Status: DC | PRN
  Administered 2014-05-19: 20 ug/min via INTRAVENOUS

## 2014-05-19 MED ORDER — ALUM & MAG HYDROXIDE-SIMETH 200-200-20 MG/5ML PO SUSP: 15.0000 mL | ORAL | Status: DC | PRN

## 2014-05-19 MED ORDER — LIDOCAINE HCL (CARDIAC) 20 MG/ML IV SOLN
INTRAVENOUS | Status: AC
  Filled 2014-05-19: qty 5

## 2014-05-19 MED ORDER — ACETAMINOPHEN 325 MG PO TABS: 325.0000 mg | ORAL_TABLET | ORAL | Status: DC | PRN

## 2014-05-19 MED ORDER — PRAVASTATIN SODIUM 40 MG PO TABS
40.0000 mg | ORAL_TABLET | Freq: Every day | ORAL | Status: DC
  Administered 2014-05-19 – 2014-05-20 (×2): 40 mg via ORAL
  Filled 2014-05-19 (×3): qty 1

## 2014-05-19 MED ORDER — METOPROLOL TARTRATE 1 MG/ML IV SOLN
INTRAVENOUS | Status: DC | PRN
  Administered 2014-05-19: 1 mg via INTRAVENOUS

## 2014-05-19 MED ORDER — FENTANYL CITRATE 0.05 MG/ML IJ SOLN
INTRAMUSCULAR | Status: AC
  Filled 2014-05-19: qty 5

## 2014-05-19 MED ORDER — PROPRANOLOL HCL 1 MG/ML IV SOLN
INTRAVENOUS | Status: DC | PRN
  Administered 2014-05-19: 1 mg via INTRAVENOUS
  Administered 2014-05-19: .1 mg via INTRAVENOUS
  Administered 2014-05-19 (×2): .2 mg via INTRAVENOUS
  Administered 2014-05-19: .1 mg via INTRAVENOUS
  Administered 2014-05-19 (×2): .2 mg via INTRAVENOUS

## 2014-05-19 MED ORDER — PROPOFOL 10 MG/ML IV BOLUS
INTRAVENOUS | Status: AC
  Filled 2014-05-19: qty 20

## 2014-05-19 MED ORDER — PANTOPRAZOLE SODIUM 40 MG PO TBEC
40.0000 mg | DELAYED_RELEASE_TABLET | Freq: Every day | ORAL | Status: DC
  Administered 2014-05-19 – 2014-05-21 (×3): 40 mg via ORAL
  Filled 2014-05-19 (×3): qty 1

## 2014-05-19 SURGICAL SUPPLY — 47 items
ADH SKN CLS APL DERMABOND .7 (GAUZE/BANDAGES/DRESSINGS) ×1
BANDAGE ELASTIC 4 VELCRO ST LF (GAUZE/BANDAGES/DRESSINGS) ×1 IMPLANT
BLADE SURG 10 STRL SS (BLADE) ×1 IMPLANT
BNDG GAUZE ELAST 4 BULKY (GAUZE/BANDAGES/DRESSINGS) ×1 IMPLANT
CANISTER SUCTION 2500CC (MISCELLANEOUS) ×2 IMPLANT
CLIP TI MEDIUM 6 (CLIP) ×2 IMPLANT
CLIP TI WIDE RED SMALL 6 (CLIP) ×2 IMPLANT
COVER PROBE W GEL 5X96 (DRAPES) ×2 IMPLANT
COVER SURGICAL LIGHT HANDLE (MISCELLANEOUS) ×2 IMPLANT
DECANTER SPIKE VIAL GLASS SM (MISCELLANEOUS) ×1 IMPLANT
DERMABOND ADVANCED (GAUZE/BANDAGES/DRESSINGS) ×1
DERMABOND ADVANCED .7 DNX12 (GAUZE/BANDAGES/DRESSINGS) ×1 IMPLANT
DRAIN PENROSE 1/4X12 LTX STRL (WOUND CARE) ×1 IMPLANT
ELECT REM PT RETURN 9FT ADLT (ELECTROSURGICAL) ×2
ELECTRODE REM PT RTRN 9FT ADLT (ELECTROSURGICAL) ×1 IMPLANT
GEL ULTRASOUND 20GR AQUASONIC (MISCELLANEOUS) IMPLANT
GLOVE BIO SURGEON STRL SZ 6.5 (GLOVE) ×1 IMPLANT
GLOVE BIO SURGEON STRL SZ7.5 (GLOVE) ×2 IMPLANT
GLOVE BIOGEL PI IND STRL 6.5 (GLOVE) IMPLANT
GLOVE BIOGEL PI IND STRL 7.0 (GLOVE) IMPLANT
GLOVE BIOGEL PI IND STRL 7.5 (GLOVE) IMPLANT
GLOVE BIOGEL PI INDICATOR 6.5 (GLOVE) ×2
GLOVE BIOGEL PI INDICATOR 7.0 (GLOVE) ×1
GLOVE BIOGEL PI INDICATOR 7.5 (GLOVE) ×1
GLOVE ECLIPSE 6.5 STRL STRAW (GLOVE) ×1 IMPLANT
GLOVE SS BIOGEL STRL SZ 7 (GLOVE) IMPLANT
GLOVE SUPERSENSE BIOGEL SZ 7 (GLOVE) ×2
GOWN STRL REUS W/ TWL LRG LVL3 (GOWN DISPOSABLE) ×3 IMPLANT
GOWN STRL REUS W/TWL LRG LVL3 (GOWN DISPOSABLE) ×8
KIT BASIN OR (CUSTOM PROCEDURE TRAY) ×2 IMPLANT
KIT ROOM TURNOVER OR (KITS) ×2 IMPLANT
LOOP VESSEL MINI RED (MISCELLANEOUS) IMPLANT
NS IRRIG 1000ML POUR BTL (IV SOLUTION) ×2 IMPLANT
PACK CV ACCESS (CUSTOM PROCEDURE TRAY) ×2 IMPLANT
PAD ARMBOARD 7.5X6 YLW CONV (MISCELLANEOUS) ×4 IMPLANT
SPONGE SURGIFOAM ABS GEL 100 (HEMOSTASIS) IMPLANT
SUT PROLENE 7 0 BV 1 (SUTURE) ×2 IMPLANT
SUT SILK 3 0 (SUTURE) ×2
SUT SILK 3-0 18XBRD TIE 12 (SUTURE) IMPLANT
SUT VIC AB 3-0 SH 27 (SUTURE) ×4
SUT VIC AB 3-0 SH 27X BRD (SUTURE) ×1 IMPLANT
SUT VICRYL 4-0 PS2 18IN ABS (SUTURE) ×3 IMPLANT
SYR BULB IRRIGATION 50ML (SYRINGE) ×1 IMPLANT
TOWEL OR 17X24 6PK STRL BLUE (TOWEL DISPOSABLE) ×2 IMPLANT
TOWEL OR 17X26 10 PK STRL BLUE (TOWEL DISPOSABLE) ×2 IMPLANT
UNDERPAD 30X30 INCONTINENT (UNDERPADS AND DIAPERS) ×2 IMPLANT
WATER STERILE IRR 1000ML POUR (IV SOLUTION) ×1 IMPLANT

## 2014-05-19 NOTE — Consult Note (Signed)
[facility] Renal Consultation Note    Indication for Consultation:  Management of ESRD/hemodialysis; anemia, hypertension/volume and secondary hyperparathyroidism  HPI: Sabrina Beck is a 73 y.o. female.  Pt is a 73yo F with PMH sig for HTN, A fib (on coumadin), CHF, and ESRD who was admitted by [facility] for superficialization of L AVF and ligation of competing vessels.  She was admitted to [facility] for anticoagulation bridge and we were asked to manage her ESRD-related issues and arrange for hemodialysis while she is an inpt.   Past Medical History  Diagnosis Date  . Anemia 03/27/2011  . Atrial fibrillation   . Murmur 06/04/2006,09/29/10    2D Echo perform both test shows EF 55%  . Chest pain 05/15/2006    stress test perform negative for ischemia  . Systemic hypertension   . Mixed hyperlipidemia   . Cancer     uterine  . Complication of anesthesia   . PONV (postoperative nausea and vomiting)   . Dysrhythmia     afib  . Renal disorder   . Kidney disease     stage 4 chronic  . GERD (gastroesophageal reflux disease)   . Diverticulosis   . CHF (congestive heart failure)   . Family history of anesthesia complication     "brother has nausea when put to sleep."  . Shortness of breath     with exertion  . Headache    Past Surgical History  Procedure Laterality Date  . Cholecystectomy    . Abdominal hysterectomy    . Breast surgery Left     tumors non canerous 2- 3 different surgeries  . Knee arthroscopy    . Av fistula placement Left 11/17/2013    Procedure: CREATION OF LEFT RADIOCEPHALIC ARTERIOVENOUS (AV) FISTULA ;  Surgeon: [name], MD;  Location: [place];  Service: Vascular;  Laterality: Left;  . Appendectomy    . Colonoscopy w/ biopsies and polypectomy    . Insertion of dialysis catheter N/A 01/24/2014    Procedure: INSERTION OF DIALYSIS CATHETER;  Surgeon: Rosetta Posner, MD;  Location: [facility] OR;  Service: Vascular;  Laterality: N/A;  . Tubal ligation      Family History:   Family History  Problem Relation Age of Onset  . Diabetes Mother   . Hypertension Mother   . Diabetes Father   . Heart disease Father     before age 45  . Heart attack Father   . Diabetes Sister   . Hyperlipidemia Sister   . Hypertension Sister   . Cancer Brother   . Diabetes Brother   . Heart disease Brother   . Hypertension Brother   . Heart attack Brother    Social History:  reports that she quit smoking about 14 years ago. She has never used smokeless tobacco. She reports that she does not drink alcohol or use illicit drugs. No Known Allergies Prior to Admission medications   Medication Sig Start Date End Date Taking? Authorizing Provider  acetaminophen (TYLENOL) 500 MG tablet Take 1,000 mg by mouth every 6 (six) hours as needed for moderate pain.    Yes Historical Provider, MD  calcium carbonate (OS-CAL) 600 MG TABS tablet Take 1,200-2,400 mg by mouth 3 (three) times daily. Take 2400 mg by mouth with every meal and take 1200 mg by mouth with snacks   Yes Historical Provider, MD  Fe Fum-FA-B Cmp-C-Zn-Mg-Mn-Cu (FERROCITE PLUS PO) Take 1 tablet by mouth daily.    Yes Historical Provider, MD  fenofibrate (TRICOR) 145 MG tablet Take 145 mg by mouth daily.   Yes Historical Provider, MD  metoprolol (LOPRESSOR) 50 MG tablet Take 50 mg by mouth 2 (two) times daily.   Yes Historical Provider, MD  Multiple Vitamins-Minerals (VISION-VITE PRESERVE PO) Take 1 tablet by mouth 2 (two) times daily.    Yes Historical Provider, MD  Omega-3 Fatty Acids (FISH OIL) 1200 MG CAPS Take 1,200 mg by mouth 2 (two) times daily.    Yes Historical Provider, MD  omeprazole (PRILOSEC) 20 MG capsule Take 20 mg by mouth daily.   Yes Historical Provider, MD  pravastatin (PRAVACHOL) 40 MG tablet Take 1 tablet (40 mg total) by mouth at bedtime. 10/21/13  Yes Mihai Croitoru, MD  Propylene Glycol (SYSTANE BALANCE) 0.6 % SOLN Place 1 drop into both eyes as needed (for burning or dry eyes).   Yes  Historical Provider, MD  sodium bicarbonate 650 MG tablet Take 650 mg by mouth 3 (three) times daily.   Yes Historical Provider, MD  Vitamin D, Ergocalciferol, (DRISDOL) 50000 UNITS CAPS Take 50,000 Units by mouth every 7 (seven) days. Takes on Tuesday.   Yes Historical Provider, MD  warfarin (COUMADIN) 5 MG tablet Take 2.5-5 mg by mouth daily. Take 5 mg by mouth on Monday and Thursday. Take 2.5 mg by mouth on all of the other days.   Yes Historical Provider, MD   Current Facility-Administered Medications  Medication Dose Route Frequency Provider Last Rate Last Dose  . 0.9 %  sodium chloride infusion  250 mL Intravenous PRN Ulyses Amor, PA-C      . acetaminophen (TYLENOL) tablet 325-650 mg  325-650 mg Oral Q4H PRN Ulyses Amor, PA-C       Or  . acetaminophen (TYLENOL) suppository 325-650 mg  325-650 mg Rectal Q4H PRN Ulyses Amor, PA-C      . acetaminophen (TYLENOL) tablet 1,000 mg  1,000 mg Oral Q6H PRN Ulyses Amor, PA-C      . alum & mag hydroxide-simeth (MAALOX/MYLANTA) 200-200-20 MG/5ML suspension 15-30 mL  15-30 mL Oral Q2H PRN Ulyses Amor, PA-C      . Derrill Memo ON 05/20/2014] cefUROXime (ZINACEF) 1.5 g in dextrose 5 % 50 mL IVPB  1.5 g Intravenous Q24H Ulyses Amor, PA-C      . fenofibrate tablet 160 mg  160 mg Oral Daily Ulyses Amor, PA-C   160 mg at 05/19/14 1301  . guaiFENesin-dextromethorphan (ROBITUSSIN DM) 100-10 MG/5ML syrup 15 mL  15 mL Oral Q4H PRN Ulyses Amor, PA-C      . hydrALAZINE (APRESOLINE) injection 10 mg  10 mg Intravenous Q2H PRN Ulyses Amor, PA-C      . labetalol (NORMODYNE,TRANDATE) injection 10 mg  10 mg Intravenous Q2H PRN Ulyses Amor, PA-C      . metoprolol (LOPRESSOR) injection 2-5 mg  2-5 mg Intravenous Q2H PRN Ulyses Amor, PA-C      . metoprolol (LOPRESSOR) tablet 50 mg  50 mg Oral BID Ulyses Amor, PA-C   50 mg at 05/19/14 1301  . morphine 2 MG/ML injection 2-5 mg  2-5 mg Intravenous Q1H PRN Ulyses Amor, PA-C      . ondansetron  [facility]) injection 4 mg  4 mg Intravenous Q6H PRN Ulyses Amor, PA-C      . oxyCODONE-acetaminophen (PERCOCET/ROXICET) 5-325 MG per tablet 1-2 tablet  1-2 tablet Oral Q4H PRN [name], MD      . pantoprazole (PROTONIX) EC  tablet 40 mg  40 mg Oral Daily Ulyses Amor, PA-C   40 mg at 05/19/14 1301  . phenol (CHLORASEPTIC) mouth spray 1 spray  1 spray Mouth/Throat PRN Ulyses Amor, PA-C      . polyvinyl alcohol (LIQUIFILM TEARS) 1.4 % ophthalmic solution 1 drop  1 drop Both Eyes PRN [name], MD      . pravastatin (PRAVACHOL) tablet 40 mg  40 mg Oral QHS Ulyses Amor, PA-C      . sodium bicarbonate tablet 650 mg  650 mg Oral TID Ulyses Amor, PA-C   650 mg at 05/19/14 1301  . sodium chloride 0.9 % injection 3 mL  3 mL Intravenous Q12H Ulyses Amor, PA-C   3 mL at 05/19/14 1200  . sodium chloride 0.9 % injection 3 mL  3 mL Intravenous PRN Ulyses Amor, PA-C      . Vitamin D (Ergocalciferol) (DRISDOL) capsule 50,000 Units  50,000 Units Oral Q7 days Ulyses Amor, PA-C   50,000 Units at 05/19/14 1301  . warfarin (COUMADIN) tablet 2.5 mg  2.5 mg Oral Once per day on Sun Tue Wed Fri Sat [name], MD      . Derrill Memo ON 05/21/2014] warfarin (COUMADIN) tablet 5 mg  5 mg Oral Once per day on Mon Thu [name], MD      . Warfarin - Physician Dosing Inpatient   Does not apply Thomson, MD       Labs: Basic Metabolic Panel:  Recent Labs Lab 05/19/14 0659  NA 138  K 3.9  GLUCOSE 102*   Liver Function Tests: No results found for this basename: AST, ALT, ALKPHOS, BILITOT, PROT, ALBUMIN,  in the last 168 hours No results found for this basename: LIPASE, AMYLASE,  in the last 168 hours No results found for this basename: AMMONIA,  in the last 168 hours CBC:  Recent Labs Lab 05/19/14 0659 05/19/14 1242  WBC  --  4.4  HGB 12.9 11.9*  HCT 38.0 37.9  MCV  --  102.7*  PLT  --  204   Cardiac Enzymes: No results found for this basename: CKTOTAL,  CKMB, CKMBINDEX, TROPONINI,  in the last 168 hours CBG: No results found for this basename: GLUCAP,  in the last 168 hours Iron Studies: No results found for this basename: IRON, TIBC, TRANSFERRIN, FERRITIN,  in the last 72 hours Studies/Results: No results found.  ROS: A comprehensive review of systems was negative. Physical Exam: Filed Vitals:   05/19/14 0940 05/19/14 1000 05/19/14 1015 05/19/14 1035  BP: 111/76 106/65 112/65 125/69  Pulse: 127 116 97 102  Temp: 98.3 F (36.8 C)  98.6 F (37 C) 98.8 F (37.1 C)  TempSrc:    Oral  Resp: 17 26 23 20   Weight:      SpO2: 100% 97% 97% 98%      Weight change:   Intake/Output Summary (Last 24 hours) at 05/19/14 1412 Last data filed at 05/19/14 1300  Gross per 24 hour  Intake    570 ml  Output     25 ml  Net    545 ml   BP 125/69  Pulse 102  Temp(Src) 98.8 F (37.1 C) (Oral)  Resp 20  Wt 95.255 kg (210 lb)  SpO2 98% General appearance: alert, cooperative and no distress Head: Normocephalic, without obvious abnormality, atraumatic Eyes: negative findings: lids and lashes normal, conjunctivae and sclerae normal and corneas clear Neck:  no adenopathy, no carotid bruit, supple, symmetrical, trachea midline and thyroid not enlarged, symmetric, no tenderness/mass/nodules Resp: clear to auscultation bilaterally Cardio: irregularly irregular rhythm GI: soft, non-tender; bowel sounds normal; no masses,  no organomegaly Extremities: LAVF +T/B, no edema Dialysis Access:  Dialysis Orders: Center: [facility]  on MWF . EDW 95.5 HD Bath 2K/2.5Ca  Time 3.5 hours Heparin none. Access RIJ TDC BFR 400 DFR 600    Hectoral 1 mcg IV/HD Epogen 9,500   Units IV/HD  Venofer  50mg  q week   Assessment/Plan: 1.  Vascular Access- s/p superficialization of L radiocephalic AVF, +T/B 2.  ESRD -  Cont with HD qMWF 3.  Hypertension/volume  - stable 4.  Anemia  - cont with epo 5.  Metabolic bone disease -  Cont with vit D and binders 6.   Nutrition - stabl3 7. A fib- on heparin bridge until coumadin becomes therapeutic  Donetta Potts, MD, [facility]  05/19/2014, 2:12 PM

## 2014-05-19 NOTE — Anesthesia Postprocedure Evaluation (Signed)
  Anesthesia Post-op Note  Patient: Sabrina Beck  Procedure(s) Performed: Procedure(s): FISTULA SUPERFICIALIZATION-LEFT ARM (Left)  Patient Location: PACU  Anesthesia Type:General  Level of Consciousness: awake, alert , oriented and patient cooperative  Airway and Oxygen Therapy: Patient Spontanous Breathing  Post-op Pain: mild  Post-op Assessment: Post-op Vital signs reviewed, Patient's Cardiovascular Status Stable, Respiratory Function Stable, Patent Airway and No signs of Nausea or vomiting  Post-op Vital Signs: stable  Last Vitals:  Filed Vitals:   05/19/14 1000  BP:   Pulse: 116  Temp:   Resp: 26    Complications: No apparent anesthesia complications

## 2014-05-19 NOTE — Anesthesia Preprocedure Evaluation (Addendum)
Anesthesia Evaluation  Patient identified by MRN, date of birth, ID band Patient awake    Reviewed: Allergy & Precautions, H&P , NPO status , Patient's Chart, lab work & pertinent test results, reviewed documented beta blocker date and time   Airway Mallampati: II TM Distance: >3 FB Neck ROM: Full    Dental  (+) Teeth Intact, Dental Advisory Given   Pulmonary shortness of breath, sleep apnea , former smoker,          Cardiovascular hypertension, Pt. on home beta blockers and Pt. on medications +CHF + dysrhythmias Atrial Fibrillation + Valvular Problems/Murmurs     Neuro/Psych  Headaches,    GI/Hepatic GERD-  ,  Endo/Other    Renal/GU ESRF and DialysisRenal disease     Musculoskeletal   Abdominal   Peds  Hematology  (+) anemia ,   Anesthesia Other Findings   Reproductive/Obstetrics                          Anesthesia Physical Anesthesia Plan  ASA: III  Anesthesia Plan: MAC and General   Post-op Pain Management:    Induction: Intravenous  Airway Management Planned: Mask and LMA  Additional Equipment:   Intra-op Plan:   Post-operative Plan:   Informed Consent: I have reviewed the patients History and Physical, chart, labs and discussed the procedure including the risks, benefits and alternatives for the proposed anesthesia with the patient or authorized representative who has indicated his/her understanding and acceptance.     Plan Discussed with: CRNA, Anesthesiologist and Surgeon  Anesthesia Plan Comments:         Anesthesia Quick Evaluation

## 2014-05-19 NOTE — Transfer of Care (Signed)
Immediate Anesthesia Transfer of Care Note  Patient: Sabrina Beck  Procedure(s) Performed: Procedure(s): FISTULA SUPERFICIALIZATION-LEFT ARM (Left)  Patient Location: PACU  Anesthesia Type:General  Level of Consciousness: awake and alert   Airway & Oxygen Therapy: Patient Spontanous Breathing and Patient connected to face mask oxygen  Post-op Assessment: Report given to PACU RN, Post -op Vital signs reviewed and stable and Patient moving all extremities X 4  Post vital signs: Reviewed and stable  Complications: No apparent anesthesia complications

## 2014-05-19 NOTE — H&P (View-Only) (Signed)
Patient is a 73 year old female who is status post placement of a left radiocephalic AV fistula April 2015. She had a Diatek catheter placed by Dr. Donnetta Hutching in June 20. She states that they started using the fistula with some success and her catheter was removed. There began to have more difficulty cannulating her fistula. A new tunneled dialysis catheter was placed by Dr. Augustin Coupe recently. She states that the catheter has been sluggish recently. She denies any numbness or tingling in her left hand. She is on Coumadin for atrial fibrillation. Her dialysis today is Monday Wednesday Friday. Recent fistulogram CD-ROM was sent by Dr. Maxie Better but I was unable to open and accessed these images. The patient states that the proximal aspect of the fistula has been angioplastied at least once. She is referred for possible Superficialization of the left arm AV fistula.  Review of systems: She denies shortness of breath. She denies chest pain.  Physical exam:  Filed Vitals:   05/14/14 0920  BP: 128/91  Pulse: 93  Height: 5' 3.5" (1.613 m)  Weight: 210 lb 8 oz (95.482 kg)  SpO2: 97%   Left upper extremity: Well-healed incisions palpable thrill in fistula fistula is palpable over the course of about 5 cm in the forearm  Assessment: Working fistula left arm previous duplex ultrasound showed the fistula was greater than a centimeter in depth there were several portions especially distally. Poorly functioning right-sided dialysis catheter.   Plan: The patient is scheduled for Superficialization of her left arm AV fistula on October 13. We will also replace her tunneled dialysis catheter at that time. She is on Coumadin facial fibrillation. We will stop this on October 9. I will get her to the hospital postoperatively so that we can restart her anticoagulation slowly a she will be at very high risk for hematoma after the dissection for Superficialization.  Ruta Hinds, MD [facility]  [place] Office: [phone] Pager: [phone]

## 2014-05-19 NOTE — Progress Notes (Signed)
PHARMACIST - PHYSICIAN COMMUNICATION DR:  Oneida Alar CONCERNING: Pharmacy Care Issues Regarding Warfarin Labs  RECOMMENDATION (Action Taken): A daily protime for three days has been ordered to meet the [facility] National Patient safety goal and comply with the current [place].   The Pharmacy will defer all warfarin dose order changes and follow up of lab results to the prescriber unless an additional order to initiate a "pharmacy Coumadin consult" is placed.  DESCRIPTION:  While hospitalized, to be in compliance with [facility] Patient Safety Goals, all patients on warfarin must have a baseline and/or current protime prior to the administration of warfarin. Pharmacy has received your order for warfarin without these required laboratory assessments.  Also, post-op Zinacef changed from q12hrs x 2 doses to q24hrs x 1 dose for ESRD.  Kelvin Cellar, RPh Pager: [phone] 05/19/2014 1:55 PM

## 2014-05-19 NOTE — Discharge Instructions (Signed)

## 2014-05-19 NOTE — Anesthesia Procedure Notes (Signed)
Procedure Name: LMA Insertion Date/Time: 05/19/2014 7:47 AM Performed by: Ollen Bowl Pre-anesthesia Checklist: Patient identified, Emergency Drugs available, Suction available, Patient being monitored and Timeout performed Patient Re-evaluated:Patient Re-evaluated prior to inductionOxygen Delivery Method: Circle system utilized Preoxygenation: Pre-oxygenation with 100% oxygen Intubation Type: IV induction Ventilation: Mask ventilation without difficulty LMA: LMA inserted LMA Size: 5.0 Number of attempts: 1 Placement Confirmation: positive ETCO2 and breath sounds checked- equal and bilateral Tube secured with: Tape Dental Injury: Teeth and Oropharynx as per pre-operative assessment

## 2014-05-19 NOTE — Interval H&P Note (Signed)
History and Physical Interval Note:  05/19/2014 7:23 AM  Sabrina Beck  has presented today for surgery, with the diagnosis of End Stage Renal Disease  The various methods of treatment have been discussed with the patient and family. After consideration of risks, benefits and other options for treatment, the patient has consented to  Procedure(s): FISTULA SUPERFICIALIZATION-LEFT ARM (Left) as a surgical intervention .  The patient's history has been reviewed, patient examined, no change in status, stable for surgery.  I have reviewed the patient's chart and labs.  Questions were answered to the patient's satisfaction.     [name] E

## 2014-05-19 NOTE — Op Note (Addendum)
Procedure: #1 Superficialization left radialcephalic AV fistula  #2 ligation of multiple side branches  Preoperative Diagnosis: Non-maturing left radialcephalic AV fistula  Postoperative diagnosis: Same  Anesthesia: General  Asst: Gerri Lins [facility]  Operative details: After obtaining informed consent, the patient was taken to the operating room. The patient was placed in supine position on the operating room table. After induction of general anesthesia and placement of a laryngeal mask the patient's entire left upper extremity was prepped and draped in the usual sterile fashion. A longitudinal incision was made near the left wrist. The incision was carried into the subcutaneous tissues down to level of the left arm AV fistula. The cephalic vein was dissected free circumferentially.  It was 5-6 mm diameter and fairly uniform size.  I then proceeded to dissect out the entire cephalic vein in the upper arm through two skip incisions of the arm. There were several side branches all of which were 1-1/2 mm in diameter and these were all ligated and divided between silk ties. The vein was fully mobilized circumferentially so it could rise to the skin surface. Subcutaneous fat was debrided from the skin edges over the entire course of the fistula so that there was less than 2 mm of skin covered the fistula.   Hemostasis was obtained. The arm incisions were closed with running 4-0 Vicryl subcuticular stitch. Dermabond was applied to all incisions. Patient tolerated the procedure well and there were no complications. Instrument, sponge, and  needle counts were correct at the end of the case. Patient had a palpable thrill in the fistula at the end of the case.  Ruta Hinds, MD [facility] [place] Office: [phone] Pager: [phone]

## 2014-05-19 NOTE — Addendum Note (Signed)
Addendum created 05/19/14 1022 by Verdie Drown, CRNA   Modules edited: Anesthesia Flowsheet

## 2014-05-19 NOTE — Progress Notes (Signed)
  Day of Surgery Note    Subjective:  C/o a little bit of soreness  Filed Vitals:   05/19/14 1035  BP: 125/69  Pulse: 102  Temp: 98.8 F (37.1 C)  Resp: 20    Incisions:   Bandaged, which is c/d/i Extremities:  Left hand is warm and there is a 2+ left radial pulse.   Assessment/Plan:  This is a 73 y.o. female who is s/p #1 Superficialization left radialcephalic AV fistula #2 ligation of multiple side branches  Day of Surgery  -pt doing well this afternoon.  Minimal pain. -dressing in tact and 2+ left radial pulse -for HD tomorrow -will bridge her back to coumadin slowly as she is at high risk for hematoma after dissection for superficialization.   Leontine Locket, PA-C 05/19/2014 12:44 PM

## 2014-05-20 ENCOUNTER — Telehealth: Payer: Self-pay | Admitting: Vascular Surgery

## 2014-05-20 ENCOUNTER — Encounter (HOSPITAL_COMMUNITY): Payer: Self-pay | Admitting: Vascular Surgery

## 2014-05-20 LAB — PROTIME-INR
INR: 1.18 (ref 0.00–1.49)
Prothrombin Time: 15 seconds (ref 11.6–15.2)

## 2014-05-20 LAB — HEPATITIS B SURFACE ANTIGEN: HEP B S AG: NEGATIVE

## 2014-05-20 MED ORDER — ALTEPLASE 2 MG IJ SOLR
2.0000 mg | Freq: Once | INTRAMUSCULAR | Status: DC | PRN
  Filled 2014-05-20: qty 2

## 2014-05-20 MED ORDER — NEPRO/CARBSTEADY PO LIQD
237.0000 mL | ORAL | Status: DC | PRN
  Filled 2014-05-20: qty 237

## 2014-05-20 MED ORDER — HEPARIN SODIUM (PORCINE) 1000 UNIT/ML DIALYSIS: 1000.0000 [IU] | INTRAMUSCULAR | Status: DC | PRN

## 2014-05-20 MED ORDER — LIDOCAINE-PRILOCAINE 2.5-2.5 % EX CREA
1.0000 "application " | TOPICAL_CREAM | CUTANEOUS | Status: DC | PRN
  Filled 2014-05-20: qty 5

## 2014-05-20 MED ORDER — SODIUM CHLORIDE 0.9 % IV SOLN: 100.0000 mL | INTRAVENOUS | Status: DC | PRN

## 2014-05-20 MED ORDER — DOXERCALCIFEROL 4 MCG/2ML IV SOLN
INTRAVENOUS | Status: AC
  Filled 2014-05-20: qty 2

## 2014-05-20 MED ORDER — LIDOCAINE HCL (PF) 1 % IJ SOLN: 5.0000 mL | INTRAMUSCULAR | Status: DC | PRN

## 2014-05-20 MED ORDER — PENTAFLUOROPROP-TETRAFLUOROETH EX AERO: 1.0000 "application " | INHALATION_SPRAY | CUTANEOUS | Status: DC | PRN

## 2014-05-20 NOTE — Progress Notes (Addendum)
  Postoperative hemodialysis access     Date of Surgery:  05/19/14 Surgeon: Oneida Alar  Subjective:  No complaints  PHYSICAL EXAMINATION:  Filed Vitals:   05/20/14 0519  BP: 93/80  Pulse: 89  Temp: 98 F (36.7 C)  Resp: 18    Incision is c/d/i with some ecchymosis Hand grip is strong  Sensation in digits is intact;  There is  Thrill  There is bruit.    ASSESSMENT/PLAN:  Sabrina Beck is a 73 y.o. year old female who is s/p #1 Superficialization left radialcephalic AV fistula #2 ligation of multiple side branches Post Op Day 1   -fistula is patent  -pt does not have evidence of steal sx -f/u with Dr. Oneida Alar in 4 weeks  -continue coumadin for Afib   Leontine Locket, PA-C [facility] [phone]   Some edema in arm but no focal hematoma.  Incision clean Dialysis this evening. Most likely home tomorrow if arm looks good Afib some intermittent increase in rate continue to follow  NO coumadin today  Ruta Hinds, MD [facility] [place]: [phone] Pager: [phone]

## 2014-05-20 NOTE — Progress Notes (Signed)
Patient ID: [name], female   DOB: 06-29-1941, 73 y.o.   MRN: [id number]  [facility] Progress Note    Subjective:   Feels well   Objective:   BP 93/80  Pulse 89  Temp(Src) 98 F (36.7 C) (Oral)  Resp 18  Ht 5\' 3"  (1.6 m)  Wt 95.482 kg (210 lb 8 oz)  BMI 37.30 kg/m2  SpO2 100%  Intake/Output: I/O last 3 completed shifts: In: 79 [P.O.:120; I.V.:450] Out: 225 [Urine:200; Blood:25]   Intake/Output this shift:  Total I/O In: 120 [P.O.:120] Out: -  Weight change: 0.227 kg (8 oz)  Physical Exam: Gen:WD obese WF in NAd CVS:no rub Resp:cta [id number] Ext:L avf +T/B  Labs: BMET  Recent Labs Lab 05/19/14 0659 05/19/14 1242  NA 138 142  K 3.9 4.8  CL  --  99  CO2  --  25  GLUCOSE 102* 108*  BUN  --  35*  CREATININE  --  7.90*  ALBUMIN  --  3.2*  CALCIUM  --  7.9*   CBC  Recent Labs Lab 05/19/14 0659 05/19/14 1242  WBC  --  4.4  HGB 12.9 11.9*  HCT 38.0 37.9  MCV  --  102.7*  PLT  --  204    @IMGRELPRIORS @ Medications:    . doxercalciferol  1 mcg Intravenous Q M,W,F-HD  . fenofibrate  160 mg Oral Daily  . metoprolol  50 mg Oral BID  . pantoprazole  40 mg Oral Daily  . pravastatin  40 mg Oral QHS  . sodium bicarbonate  650 mg Oral TID  . sodium chloride  3 mL Intravenous Q12H  . Vitamin D (Ergocalciferol)  50,000 Units Oral Q7 days  . warfarin  2.5 mg Oral Once per day on Sun Tue Wed Fri Sat  . [START ON 05/21/2014] warfarin  5 mg Oral Once per day on Mon Thu  . Warfarin - Physician Dosing Inpatient   Does not apply q1800   Dialysis Orders: [facility]: [facility] on MWF .  EDW 95.5 HD Bath 2K/2.5Ca Time 3.5 hours Heparin none. Access RIJ TDC BFR 400 DFR 600  Hectoral 1 mcg IV/HD Epogen 9,500 Units IV/HD Venofer 50mg  q week  Assessment/Plan:  1. Vascular Access- s/p superficialization of L radiocephalic AVF, +T/B 2. ESRD - Cont with HD qMWF 3. Hypertension/volume - stable 4. Anemia - cont with epo 5. Metabolic bone  disease - Cont with vit D and binders 6. Nutrition - stabl3 7. A fib- on heparin bridge until coumadin becomes therapeutic 8.   Anastasya Jewell A 05/20/2014, 10:15 AM

## 2014-05-20 NOTE — Progress Notes (Signed)
Received tele alert for HR > 140; walked in room to check pt-pt was up getting gown changed; notified PA of increased HR no new orders given HR currently 110s-120s.  Rowe Pavy, RN

## 2014-05-20 NOTE — Telephone Encounter (Addendum)
Message copied by Gena Fray on Wed May 20, 2014 11:16 AM ------      Message from: Denman George      Created: Wed May 20, 2014  9:59 AM      Regarding: Zigmund Daniel log; Also needs 4 wk. f/u with [facility]                   ----- Message -----         From: Gabriel Earing, PA-C         Sent: 05/20/2014   7:34 AM           To: [facility]vs Charge Pool            S/p  #1 Superficialization left radialcephalic AV fistula #2 ligation of multiple side branches 05/19/14.            F/u with Dr. Oneida Alar in 4 weeks.            Thanks,      Samantha ------  05/20/14: left msg for patient re appt, dpm

## 2014-05-21 ENCOUNTER — Telehealth: Payer: Self-pay | Admitting: Vascular Surgery

## 2014-05-21 LAB — PROTIME-INR
INR: 1.22 (ref 0.00–1.49)
PROTHROMBIN TIME: 15.5 s — AB (ref 11.6–15.2)

## 2014-05-21 MED ORDER — OXYCODONE-ACETAMINOPHEN 5-325 MG PO TABS: 1.0000 | ORAL_TABLET | ORAL | Status: DC | PRN

## 2014-05-21 NOTE — Telephone Encounter (Addendum)
Message copied by Gena Fray on Thu May 21, 2014  9:32 AM ------      Message from: Peter Minium K      Created: Wed May 20, 2014  2:05 PM      Regarding: Schedule                   ----- Message -----         From: Ulyses Amor, PA-C         Sent: 05/20/2014  12:52 PM           To: Vvs Charge Pool            F/U with Dr. Oneida Alar in 2 weeks not 4 thx [facility] ------  05/21/14: left msg for patient with new appt time, dpm

## 2014-05-21 NOTE — Progress Notes (Signed)
Utilization review completed. Elden Brucato, RN, BSN. 

## 2014-05-21 NOTE — Progress Notes (Signed)
Patient ID: Sabrina Beck, female   DOB: 1940/09/09, 73 y.o.   MRN: [id number]  [facility] Progress Note    Subjective:   Feels well and ready to go home   Objective:   BP 123/79  Pulse 98  Temp(Src) 97.3 F (36.3 C) (Oral)  Resp 20  Ht 5\' 3"  (1.6 m)  Wt 95.754 kg (211 lb 1.6 oz)  BMI 37.40 kg/m2  SpO2 97%  Intake/Output: I/O last 3 completed shifts: In: 120 [P.O.:120] Out: 1000 [Urine:400; Other:600]   Intake/Output this shift:    Weight change: 0.518 kg (1 lb 2.3 oz)  Physical Exam: Gen:WD WN WF in NAD CVS:IRR IRR, no rub Resp:cta [id number] Ext:L AVF +T/B  Labs: BMET  Recent Labs Lab 05/19/14 0659 05/19/14 1242  NA 138 142  K 3.9 4.8  CL  --  99  CO2  --  25  GLUCOSE 102* 108*  BUN  --  35*  CREATININE  --  7.90*  ALBUMIN  --  3.2*  CALCIUM  --  7.9*   CBC  Recent Labs Lab 05/19/14 0659 05/19/14 1242  WBC  --  4.4  HGB 12.9 11.9*  HCT 38.0 37.9  MCV  --  102.7*  PLT  --  204    @IMGRELPRIORS @ Medications:    . doxercalciferol  1 mcg Intravenous Q M,W,F-HD  . fenofibrate  160 mg Oral Daily  . metoprolol  50 mg Oral BID  . pantoprazole  40 mg Oral Daily  . pravastatin  40 mg Oral QHS  . sodium bicarbonate  650 mg Oral TID  . sodium chloride  3 mL Intravenous Q12H  . Vitamin D (Ergocalciferol)  50,000 Units Oral Q7 days    Dialysis Orders: Center: [facility] on MWF .  EDW 95.5 HD Bath 2K/2.5Ca Time 3.5 hours Heparin none. Access RIJ TDC BFR 400 DFR 600  Hectoral 1 mcg IV/HD Epogen 9,500 Units IV/HD Venofer 50mg  q week   Assessment/Plan:  1. Vascular Access- s/p superficialization of L radiocephalic AVF, +T/B 2. ESRD - Cont with HD qMWF to go to outpt unit tomorrow without changes to her HD orders 3. Hypertension/volume - stable 4. Anemia - cont with epo 5. Metabolic bone disease - Cont with vit D and binders 6. Nutrition - stabl3 7. A fib- pt to resume outpt coumadin per [facility].  8. Dispo- pt to be d/c'd  home today [place] A 05/21/2014, 8:14 AM

## 2014-05-21 NOTE — Progress Notes (Addendum)
       Subjective  - Doing well no new complaints.   Objective 120/74 98 98.3 F (36.8 C) (Oral) 21 97%  Intake/Output Summary (Last 24 hours) at 05/21/14 0749 Last data filed at 05/20/14 2033  Gross per 24 hour  Intake    120 ml  Output    800 ml  Net   -680 ml    Palpable thrill Incision without hematoma, C/D/I Hand N/V/M intact no signs of steal   Assessment/Planning: POD #2 Superficialization left radialcephalic AV fistula #2 ligation of multiple side branches Discharge home she may restart her normal coumadin dose tonight. Follow up with Dr. Oneida Alar in 2 weeks    Theda Sers Ina 05/21/2014 7:49 AM -- Agree with above.  Restart usual coumadin dose today. Keep arm elevated to reduce swelling. D/c home  Ruta Hinds, MD [facility] [place] Office: [phone] Pager: [phone]  Laboratory Lab Results:  Recent Labs  05/19/14 0659 05/19/14 1242  WBC  --  4.4  HGB 12.9 11.9*  HCT 38.0 37.9  PLT  --  204   BMET  Recent Labs  05/19/14 0659 05/19/14 1242  NA 138 142  K 3.9 4.8  CL  --  99  CO2  --  25  GLUCOSE 102* 108*  BUN  --  35*  CREATININE  --  7.90*  CALCIUM  --  7.9*    COAG Lab Results  Component Value Date   INR 1.22 05/21/2014   INR 1.18 05/20/2014   INR 1.17 05/19/2014   No results found for this basename: PTT

## 2014-05-27 NOTE — Discharge Summary (Signed)
[facility] Discharge Summary   Patient ID:  Sabrina Beck MRN: [id number] DOB/AGE: [birth year] 73 y.o.  Admit date: 05/19/2014 Discharge date: 05/27/2014 Date of Surgery: 05/19/2014 Surgeon: Surgeon(s): [name], MD  Admission Diagnosis: End Stage Renal Disease  Discharge Diagnoses:  End Stage Renal Disease  Secondary Diagnoses: Past Medical History  Diagnosis Date  . Anemia 03/27/2011  . Atrial fibrillation   . Murmur 06/04/2006,09/29/10    2D Echo perform both test shows EF 55%  . Chest pain 05/15/2006    stress test perform negative for ischemia  . Systemic hypertension   . Mixed hyperlipidemia   . Cancer     uterine  . Complication of anesthesia   . PONV (postoperative nausea and vomiting)   . Dysrhythmia     afib  . Renal disorder   . Kidney disease     stage 4 chronic  . GERD (gastroesophageal reflux disease)   . Diverticulosis   . CHF (congestive heart failure)   . Family history of anesthesia complication     "brother has nausea when put to sleep."  . Shortness of breath     with exertion  . Headache     Procedure(s): FISTULA SUPERFICIALIZATION-LEFT ARM  Discharged Condition: good  HPI: Patient is a 73 year old female who is status post placement of a left radiocephalic AV fistula April 2015. She had a Diatek catheter placed by Dr. Donnetta Hutching in June 20.   They were unable to stick the fistula consistently.  She is here for superficialization left radialcephalic AV fistula and ligation of multiple side branches.   Hospital Course:  Sabrina Beck is a 73 y.o. female is S/P Left Procedure(s): FISTULA SUPERFICIALIZATION-LEFT ARM She was admitted secondary to having to be off her coumadin for her procedure.  She has A fib.  We will re-started her coumadin regular home dose post op day one.  She was stable without wound complications and discharged home on 05/21/2014 after she completed dialysis.    Treatment Team:  Donato Heinz, MD  Significant Diagnostic Studies: CBC Lab Results  Component Value Date   WBC 4.4 05/19/2014   HGB 11.9* 05/19/2014   HCT 37.9 05/19/2014   MCV 102.7* 05/19/2014   PLT 204 05/19/2014    BMET    Component Value Date/Time   NA 142 05/19/2014 1242   K 4.8 05/19/2014 1242   CL 99 05/19/2014 1242   CO2 25 05/19/2014 1242   GLUCOSE 108* 05/19/2014 1242   BUN 35* 05/19/2014 1242   CREATININE 7.90* 05/19/2014 1242   CALCIUM 7.9* 05/19/2014 1242   CALCIUM 8.1* 01/11/2012 1039   GFRNONAA 4* 05/19/2014 1242   GFRAA 5* 05/19/2014 1242   COAG Lab Results  Component Value Date   INR 1.22 05/21/2014   INR 1.18 05/20/2014   INR 1.17 05/19/2014     Disposition:  Discharge to :Home Discharge Instructions   Call MD for:  redness, tenderness, or signs of infection (pain, swelling, bleeding, redness, odor or green/yellow discharge around incision site)    Complete by:  As directed      Call MD for:  severe or increased pain, loss or decreased feeling  in affected limb(s)    Complete by:  As directed      Call MD for:  temperature >100.5    Complete by:  As directed      Discharge instructions    Complete by:  As directed   You may wash your  arm daily with soap and water.  Do not let them stick your fistula until Dr. Oneida Alar give you the go ahead.     Driving Restrictions    Complete by:  As directed   No driving for 24 hours     Increase activity slowly    Complete by:  As directed   Walk with assistance use walker or cane as needed     Lifting restrictions    Complete by:  As directed   No lifting for 6 weeks     Resume previous diet    Complete by:  As directed             Medication List         acetaminophen 500 MG tablet  Commonly known as:  TYLENOL  Take 1,000 mg by mouth every 6 (six) hours as needed for moderate pain.     calcium carbonate 600 MG Tabs tablet  Commonly known as:  OS-CAL  Take 1,200-2,400 mg by mouth 3 (three) times daily. Take  2400 mg by mouth with every meal and take 1200 mg by mouth with snacks     fenofibrate 145 MG tablet  Commonly known as:  TRICOR  Take 145 mg by mouth daily.     FERROCITE PLUS PO  Take 1 tablet by mouth daily.     Fish Oil 1200 MG Caps  Take 1,200 mg by mouth 2 (two) times daily.     metoprolol 50 MG tablet  Commonly known as:  LOPRESSOR  Take 50 mg by mouth 2 (two) times daily.     omeprazole 20 MG capsule  Commonly known as:  PRILOSEC  Take 20 mg by mouth daily.     oxyCODONE-acetaminophen 5-325 MG per tablet  Commonly known as:  PERCOCET/ROXICET  Take 1 tablet by mouth every 4 (four) hours as needed for moderate pain.     pravastatin 40 MG tablet  Commonly known as:  PRAVACHOL  Take 1 tablet (40 mg total) by mouth at bedtime.     sodium bicarbonate 650 MG tablet  Take 650 mg by mouth 3 (three) times daily.     SYSTANE BALANCE 0.6 % Soln  Generic drug:  Propylene Glycol  Place 1 drop into both eyes as needed (for burning or dry eyes).     VISION-VITE PRESERVE PO  Take 1 tablet by mouth 2 (two) times daily.     Vitamin D (Ergocalciferol) 50000 UNITS Caps capsule  Commonly known as:  DRISDOL  Take 50,000 Units by mouth every 7 (seven) days. Takes on Tuesday.     warfarin 5 MG tablet  Commonly known as:  COUMADIN  Take 2.5-5 mg by mouth daily. Take 5 mg by mouth on Monday and Thursday. Take 2.5 mg by mouth on all of the other days.       Verbal and written Discharge instructions given to the patient. Wound care per Discharge AVS     Follow-up Information   Follow up with [name], MD In 2 weeks. (Office will call you to arrange your appt (sent))    Specialty:  Vascular Surgery   Contact information:   [address] [place] [address] [phone]       Signed: [facility] 05/27/2014, 1:21 PM

## 2014-06-03 ENCOUNTER — Encounter: Payer: Self-pay | Admitting: Vascular Surgery

## 2014-06-04 ENCOUNTER — Ambulatory Visit (INDEPENDENT_AMBULATORY_CARE_PROVIDER_SITE_OTHER): Payer: Medicare HMO | Admitting: Vascular Surgery

## 2014-06-04 ENCOUNTER — Encounter: Payer: Self-pay | Admitting: Vascular Surgery

## 2014-06-04 VITALS — BP 126/83 | HR 102 | Ht 63.0 in | Wt 207.0 lb

## 2014-06-04 DIAGNOSIS — N186 End stage renal disease: Secondary | ICD-10-CM

## 2014-06-04 DIAGNOSIS — Z4931 Encounter for adequacy testing for hemodialysis: Secondary | ICD-10-CM

## 2014-06-04 NOTE — Addendum Note (Signed)
Addended by: Mena Goes on: 06/04/2014 10:48 AM   Modules accepted: Orders

## 2014-06-04 NOTE — Progress Notes (Signed)
Patient is a 73 year old female who is status post placement of a left radiocephalic AV fistula April 2015. She had a Diatek catheter placed by Dr. Donnetta Hutching in June 20.  She returns today for postoperative visit after Superficialization of her left radiocephalic AV fistula with ligation of multiple side branches. She reports no drainage from her incision. She does have some peri-incisional numbness. She denies any numbness or tingling in her left hand. She is on Coumadin for atrial fibrillation. Her dialysis today is Monday Wednesday Friday.   Physical exam:   Filed Vitals:   06/04/14 0850  BP: 126/83  Pulse: 102  Height: 5\' 3"  (1.6 m)  Weight: 207 lb (93.895 kg)  SpO2: 97%     Left upper extremity: Well-healed incisions palpable thrill in fistula   Assessment: Healing incision after recent Superficialization and side branch ligation  Plan: The patient will continue to exercise the left arm AV fistula. She will return for follow-up in 1 month with an ultrasound. Hopefully the fistula will be ready for use at that point.  Ruta Hinds, MD [facility] [place] Office: [phone] Pager: [phone]

## 2014-06-08 ENCOUNTER — Other Ambulatory Visit: Payer: Self-pay

## 2014-06-11 ENCOUNTER — Telehealth: Payer: Self-pay | Admitting: Cardiovascular Disease

## 2014-06-11 ENCOUNTER — Telehealth: Payer: Self-pay | Admitting: *Deleted

## 2014-06-11 NOTE — Telephone Encounter (Signed)
[phone]   Fenofibrate 145 mg is not recommended for patient to take. Please call.

## 2014-06-11 NOTE — Telephone Encounter (Signed)
Sabrina Beck is denying Fenofibrate due to dialysis - not recommended unless Tg are > 500. Advised the Dr. Loletha Grayer is out of town and will call once he returns and responds to message.  Will send to Dr. Loletha Grayer for review and advise.

## 2014-06-11 NOTE — Telephone Encounter (Signed)
Faxed refill authorization for Fenofibrate 145mg  take daily #30 w/11 refills to [facility] 06/09/14.

## 2014-06-18 ENCOUNTER — Encounter: Payer: Medicare HMO | Admitting: Vascular Surgery

## 2014-06-18 NOTE — Telephone Encounter (Signed)
Well, I have no access to her latest lipid profile. Can I get a copy please?

## 2014-06-19 NOTE — Telephone Encounter (Signed)
Requested copy of latest Lipid Profile from Dr. Gerarda Fraction.  07/17/2013 : Chol 123 Tg 376  HDL 20 LDL 28

## 2014-06-23 ENCOUNTER — Telehealth: Payer: Self-pay | Admitting: *Deleted

## 2014-06-23 NOTE — Telephone Encounter (Signed)
Patient notified to D/C Fenofibrate secondary to renal failure.  Patient voiced understanding.

## 2014-07-15 ENCOUNTER — Encounter: Payer: Self-pay | Admitting: Vascular Surgery

## 2014-07-16 ENCOUNTER — Ambulatory Visit (HOSPITAL_COMMUNITY)
Admission: RE | Admit: 2014-07-16 | Discharge: 2014-07-16 | Disposition: A | Discharge status: Home / Self Care | Payer: Medicare HMO | Source: Ambulatory Visit | Attending: Vascular Surgery | Admitting: Vascular Surgery

## 2014-07-16 ENCOUNTER — Ambulatory Visit (INDEPENDENT_AMBULATORY_CARE_PROVIDER_SITE_OTHER): Payer: Medicare HMO | Admitting: Vascular Surgery

## 2014-07-16 ENCOUNTER — Encounter: Payer: Self-pay | Admitting: Vascular Surgery

## 2014-07-16 VITALS — BP 149/74 | HR 74 | Ht 63.0 in | Wt 203.5 lb

## 2014-07-16 DIAGNOSIS — Z4931 Encounter for adequacy testing for hemodialysis: Secondary | ICD-10-CM

## 2014-07-16 DIAGNOSIS — N186 End stage renal disease: Secondary | ICD-10-CM

## 2014-07-16 DIAGNOSIS — Z992 Dependence on renal dialysis: Secondary | ICD-10-CM

## 2014-07-16 NOTE — Progress Notes (Signed)
Patient is a 73 year old female who returns for follow-up today. She underwent placement of a left radiocephalic fistula in April 2015. I revised this to 06/04/2014 with ligation of side branches and Superficialization. She states they have been successfully cannulating the fistula. She still is dialyzing intermittently from a right side Diatek catheter. She denies numbness and tingling in her hand. She does have some numbness and tingling in the left forearm over the course of the fistula.  Physical exam:  Filed Vitals:   07/16/14 1117  BP: 149/74  Pulse: 74  Height: 5\' 3"  (1.6 m)  Weight: 203 lb 8 oz (92.307 kg)  SpO2: 97%    Easily palpable thrill fistula visible throughout the forearm.  Data: Patient had a duplex ultrasound the fistula today which showed the fistula diameter is greater than 6 mm throughout most of its course and less than 6 m in depth from the skin.  Assessment: Mature left radiocephalic AV fistula. He  Plan: Follow-up as needed.  Ruta Hinds, MD [facility] [place] Office: [phone] Pager: [phone]

## 2014-09-05 ENCOUNTER — Encounter (HOSPITAL_COMMUNITY): Payer: Self-pay | Admitting: Emergency Medicine

## 2014-09-05 ENCOUNTER — Emergency Department (HOSPITAL_COMMUNITY): Payer: Medicare HMO

## 2014-09-05 ENCOUNTER — Emergency Department (HOSPITAL_COMMUNITY)
Admission: EM | Admit: 2014-09-05 | Discharge: 2014-09-05 | Disposition: A | Discharge status: Home / Self Care | Payer: Medicare HMO | Attending: Emergency Medicine | Admitting: Emergency Medicine

## 2014-09-05 DIAGNOSIS — Z7901 Long term (current) use of anticoagulants: Secondary | ICD-10-CM | POA: Diagnosis not present

## 2014-09-05 DIAGNOSIS — K219 Gastro-esophageal reflux disease without esophagitis: Secondary | ICD-10-CM | POA: Insufficient documentation

## 2014-09-05 DIAGNOSIS — S0990XA Unspecified injury of head, initial encounter: Secondary | ICD-10-CM | POA: Diagnosis not present

## 2014-09-05 DIAGNOSIS — S199XXA Unspecified injury of neck, initial encounter: Secondary | ICD-10-CM | POA: Insufficient documentation

## 2014-09-05 DIAGNOSIS — Y9389 Activity, other specified: Secondary | ICD-10-CM | POA: Diagnosis not present

## 2014-09-05 DIAGNOSIS — Z862 Personal history of diseases of the blood and blood-forming organs and certain disorders involving the immune mechanism: Secondary | ICD-10-CM | POA: Insufficient documentation

## 2014-09-05 DIAGNOSIS — Z87448 Personal history of other diseases of urinary system: Secondary | ICD-10-CM | POA: Diagnosis not present

## 2014-09-05 DIAGNOSIS — E782 Mixed hyperlipidemia: Secondary | ICD-10-CM | POA: Diagnosis not present

## 2014-09-05 DIAGNOSIS — Y998 Other external cause status: Secondary | ICD-10-CM | POA: Insufficient documentation

## 2014-09-05 DIAGNOSIS — I509 Heart failure, unspecified: Secondary | ICD-10-CM | POA: Diagnosis not present

## 2014-09-05 DIAGNOSIS — I4891 Unspecified atrial fibrillation: Secondary | ICD-10-CM | POA: Insufficient documentation

## 2014-09-05 DIAGNOSIS — Z8542 Personal history of malignant neoplasm of other parts of uterus: Secondary | ICD-10-CM | POA: Diagnosis not present

## 2014-09-05 DIAGNOSIS — Z79899 Other long term (current) drug therapy: Secondary | ICD-10-CM | POA: Diagnosis not present

## 2014-09-05 DIAGNOSIS — Z87891 Personal history of nicotine dependence: Secondary | ICD-10-CM | POA: Insufficient documentation

## 2014-09-05 DIAGNOSIS — I10 Essential (primary) hypertension: Secondary | ICD-10-CM | POA: Insufficient documentation

## 2014-09-05 DIAGNOSIS — Y9289 Other specified places as the place of occurrence of the external cause: Secondary | ICD-10-CM | POA: Diagnosis not present

## 2014-09-05 DIAGNOSIS — W19XXXA Unspecified fall, initial encounter: Secondary | ICD-10-CM

## 2014-09-05 NOTE — ED Notes (Signed)
Pt reports slipped on ice while trying to get out of car this am. Pt denies loc,dizziness. Pt reports is on coumadin.

## 2014-09-05 NOTE — ED Provider Notes (Signed)
CSN: [id number]     Arrival date & time 09/05/14  1025 History  This chart was scribed for Maudry Diego, MD by Peyton Bottoms, ED Scribe. This patient was seen in room APA07/APA07 and the patient's care was started at 10:57 AM.   Chief Complaint  Patient presents with  . Fall   Patient is a 74 y.o. female presenting with fall. The history is provided by the patient (pt. complains up neck pain).  Fall This is a new problem. The current episode started 1 to 2 hours ago. The problem occurs constantly. The problem has not changed since onset.Pertinent negatives include no chest pain, no abdominal pain, no headaches and no shortness of breath. Nothing aggravates the symptoms. Nothing relieves the symptoms. She has tried nothing for the symptoms.   HPI Comments: Sabrina Beck is a 74 y.o. female who presents to the Emergency Department complaining of head pain due to patient slipping on ice and falling while getting out of the car this morning. She denies LOC. She states that she fell on her bottom during fall and hit her head. Patient states she is currently taking anticoagulant medication.  Past Medical History  Diagnosis Date  . Anemia 03/27/2011  . Atrial fibrillation   . Murmur 06/04/2006,09/29/10    2D Echo perform both test shows EF 55%  . Chest pain 05/15/2006    stress test perform negative for ischemia  . Systemic hypertension   . Mixed hyperlipidemia   . Cancer     uterine  . Complication of anesthesia   . PONV (postoperative nausea and vomiting)   . Dysrhythmia     afib  . Renal disorder   . Kidney disease     stage 4 chronic  . GERD (gastroesophageal reflux disease)   . Diverticulosis   . CHF (congestive heart failure)   . Family history of anesthesia complication     "brother has nausea when put to sleep."  . Shortness of breath     with exertion  . Headache    Past Surgical History  Procedure Laterality Date  . Cholecystectomy    . Abdominal hysterectomy     . Breast surgery Left     tumors non canerous 2- 3 different surgeries  . Knee arthroscopy    . Av fistula placement Left 11/17/2013    Procedure: CREATION OF LEFT RADIOCEPHALIC ARTERIOVENOUS (AV) FISTULA ;  Surgeon: [name], MD;  Location: [place];  Service: Vascular;  Laterality: Left;  . Appendectomy    . Colonoscopy w/ biopsies and polypectomy    . Insertion of dialysis catheter N/A 01/24/2014    Procedure: INSERTION OF DIALYSIS CATHETER;  Surgeon: Rosetta Posner, MD;  Location: [facility];  Service: Vascular;  Laterality: N/A;  . Tubal ligation    . Fistula superficialization Left 05/19/2014    Procedure: FISTULA SUPERFICIALIZATION-LEFT ARM;  Surgeon: [name], MD;  Location: [facility] OR;  Service: Vascular;  Laterality: Left;   Family History  Problem Relation Age of Onset  . Diabetes Mother   . Hypertension Mother   . Diabetes Father   . Heart disease Father     before age 16  . Heart attack Father   . Diabetes Sister   . Hyperlipidemia Sister   . Hypertension Sister   . Cancer Brother   . Diabetes Brother   . Heart disease Brother   . Hypertension Brother   . Heart attack Brother    History  Substance  Use Topics  . Smoking status: Former Smoker    Quit date: 08/08/1999  . Smokeless tobacco: Never Used  . Alcohol Use: No   OB History    No data available     Review of Systems  Constitutional: Negative for appetite change and fatigue.  HENT: Negative for congestion, ear discharge and sinus pressure.   Eyes: Negative for discharge.  Respiratory: Negative for cough and shortness of breath.   Cardiovascular: Negative for chest pain.  Gastrointestinal: Negative for abdominal pain and diarrhea.  Genitourinary: Negative for frequency and hematuria.  Musculoskeletal: Positive for neck pain and neck stiffness. Negative for back pain.  Skin: Negative for rash.  Neurological: Negative for seizures and headaches.  Psychiatric/Behavioral: Negative for hallucinations.    Allergies  Review of patient's allergies indicates no known allergies.  Home Medications   Prior to Admission medications   Medication Sig Start Date End Date Taking? Authorizing Provider  acetaminophen (TYLENOL) 500 MG tablet Take 1,000 mg by mouth every 6 (six) hours as needed for moderate pain.     Historical Provider, MD  calcium carbonate (OS-CAL) 600 MG TABS tablet Take 1,200-2,400 mg by mouth 3 (three) times daily. Take 2400 mg by mouth with every meal and take 1200 mg by mouth with snacks    Historical Provider, MD  Fe Fum-FA-B Cmp-C-Zn-Mg-Mn-Cu (FERROCITE PLUS PO) Take 1 tablet by mouth daily.     Historical Provider, MD  metoprolol (LOPRESSOR) 50 MG tablet Take 50 mg by mouth 2 (two) times daily.    Historical Provider, MD  Multiple Vitamins-Minerals (VISION-VITE PRESERVE PO) Take 1 tablet by mouth 2 (two) times daily.     Historical Provider, MD  Omega-3 Fatty Acids (FISH OIL) 1200 MG CAPS Take 1,200 mg by mouth 2 (two) times daily.     Historical Provider, MD  omeprazole (PRILOSEC) 20 MG capsule Take 20 mg by mouth daily.    Historical Provider, MD  oxyCODONE-acetaminophen (PERCOCET/ROXICET) 5-325 MG per tablet Take 1 tablet by mouth every 4 (four) hours as needed for moderate pain. 05/21/14   Ulyses Amor, PA-C  pravastatin (PRAVACHOL) 40 MG tablet Take 1 tablet (40 mg total) by mouth at bedtime. 10/21/13   Mihai Croitoru, MD  Propylene Glycol (SYSTANE BALANCE) 0.6 % SOLN Place 1 drop into both eyes as needed (for burning or dry eyes).    Historical Provider, MD  sodium bicarbonate 650 MG tablet Take 650 mg by mouth 3 (three) times daily.    Historical Provider, MD  Vitamin D, Ergocalciferol, (DRISDOL) 50000 UNITS CAPS Take 50,000 Units by mouth every 7 (seven) days. Takes on Tuesday.    Historical Provider, MD  warfarin (COUMADIN) 5 MG tablet Take 2.5-5 mg by mouth daily. Take 5 mg by mouth on Monday and Thursday. Take 2.5 mg by mouth on all of the other days.    Historical  Provider, MD   Triage Vitals: BP 135/65 mmHg  Pulse 112  Temp(Src) 97.9 F (36.6 C) (Oral)  Resp 18  Ht 5\' 3"  (1.6 m)  Wt 200 lb (90.719 kg)  BMI 35.44 kg/m2  SpO2 98%  Physical Exam  Constitutional: She is oriented to person, place, and time. She appears well-developed.  HENT:  Head: Normocephalic.  Eyes: Conjunctivae and EOM are normal. No scleral icterus.  Neck: Neck supple. No thyromegaly present.  Cardiovascular: Normal rate and regular rhythm.  Exam reveals no gallop and no friction rub.   No murmur heard. Pulmonary/Chest: No stridor. She has no wheezes.  She has no rales. She exhibits no tenderness.  Abdominal: She exhibits no distension. There is no tenderness. There is no rebound.  Musculoskeletal: Normal range of motion. She exhibits tenderness. She exhibits no edema.  Mild tenderness to posterior left upper neck.  Lymphadenopathy:    She has no cervical adenopathy.  Neurological: She is oriented to person, place, and time. She exhibits normal muscle tone. Coordination normal.  Skin: No rash noted. No erythema.  Psychiatric: She has a normal mood and affect. Her behavior is normal.   ED Course  Procedures (including critical care time)  DIAGNOSTIC STUDIES: Oxygen Saturation is 98% on RA, normal by my interpretation.    COORDINATION OF CARE: 11:01 AM- Discussed plans to order diagnostic CT of head and cervical spine. Pt advised of plan for treatment and pt agrees.  Labs Review Labs Reviewed - No data to display  Imaging Review No results found.   EKG Interpretation None     MDM   Final diagnoses:  None    Fall no injury,   Small finding on  Ct neck.  Pt told to follow up with pcp  I personally performed the services described in this documentation, which was scribed in my presence. The recorded information has been reviewed and is accurate.  Maudry Diego, MD 09/05/14 1325

## 2014-09-05 NOTE — Discharge Instructions (Signed)
You may have dialysis now.     Follow up with dr. Gerarda Fraction in 1-2 weeks for recheck of small finding an neck ct scan

## 2014-09-26 ENCOUNTER — Other Ambulatory Visit: Payer: Self-pay | Admitting: Pharmacist Clinician (PhC)/ Clinical Pharmacy Specialist

## 2014-09-28 ENCOUNTER — Telehealth: Payer: Self-pay

## 2014-09-28 MED ORDER — METOPROLOL TARTRATE 50 MG PO TABS: 50.0000 mg | ORAL_TABLET | Freq: Two times a day (BID) | ORAL | Status: DC

## 2014-09-28 NOTE — Telephone Encounter (Signed)
Rx(s) sent to pharmacy electronically.  

## 2014-10-26 ENCOUNTER — Ambulatory Visit: Payer: Self-pay | Admitting: *Deleted

## 2014-10-26 DIAGNOSIS — Z5181 Encounter for therapeutic drug level monitoring: Secondary | ICD-10-CM

## 2014-10-26 DIAGNOSIS — I4891 Unspecified atrial fibrillation: Secondary | ICD-10-CM

## 2014-10-27 ENCOUNTER — Other Ambulatory Visit: Payer: Self-pay | Admitting: Cardiovascular Disease

## 2014-10-27 NOTE — Telephone Encounter (Signed)
Rx refill sent to patient pharmacy   

## 2014-11-16 ENCOUNTER — Other Ambulatory Visit: Payer: Self-pay | Admitting: Pharmacist Clinician (PhC)/ Clinical Pharmacy Specialist

## 2015-01-12 ENCOUNTER — Other Ambulatory Visit (HOSPITAL_COMMUNITY): Payer: Self-pay | Admitting: Family Medicine

## 2015-01-12 DIAGNOSIS — Z139 Encounter for screening, unspecified: Secondary | ICD-10-CM

## 2015-01-12 DIAGNOSIS — M810 Age-related osteoporosis without current pathological fracture: Secondary | ICD-10-CM

## 2015-01-21 ENCOUNTER — Ambulatory Visit (HOSPITAL_COMMUNITY)
Admission: RE | Admit: 2015-01-21 | Discharge: 2015-01-21 | Disposition: A | Discharge status: Home / Self Care | Payer: Medicare HMO | Source: Ambulatory Visit | Attending: Family Medicine | Admitting: Family Medicine

## 2015-01-21 DIAGNOSIS — M810 Age-related osteoporosis without current pathological fracture: Secondary | ICD-10-CM | POA: Diagnosis not present

## 2015-01-21 DIAGNOSIS — Z780 Asymptomatic menopausal state: Secondary | ICD-10-CM | POA: Diagnosis not present

## 2015-01-21 DIAGNOSIS — Z1231 Encounter for screening mammogram for malignant neoplasm of breast: Secondary | ICD-10-CM | POA: Diagnosis present

## 2015-01-21 DIAGNOSIS — Z139 Encounter for screening, unspecified: Secondary | ICD-10-CM

## 2015-04-17 ENCOUNTER — Other Ambulatory Visit: Payer: Self-pay | Admitting: Pharmacist Clinician (PhC)/ Clinical Pharmacy Specialist

## 2015-05-04 ENCOUNTER — Other Ambulatory Visit: Payer: Self-pay | Admitting: Cardiovascular Disease

## 2015-05-04 NOTE — Telephone Encounter (Signed)
Rx request sent to pharmacy.  

## 2015-05-10 DIAGNOSIS — N186 End stage renal disease: Secondary | ICD-10-CM | POA: Diagnosis not present

## 2015-05-10 DIAGNOSIS — Z992 Dependence on renal dialysis: Secondary | ICD-10-CM | POA: Diagnosis not present

## 2015-05-12 DIAGNOSIS — N186 End stage renal disease: Secondary | ICD-10-CM | POA: Diagnosis not present

## 2015-05-12 DIAGNOSIS — Z992 Dependence on renal dialysis: Secondary | ICD-10-CM | POA: Diagnosis not present

## 2015-05-14 DIAGNOSIS — Z992 Dependence on renal dialysis: Secondary | ICD-10-CM | POA: Diagnosis not present

## 2015-05-14 DIAGNOSIS — N186 End stage renal disease: Secondary | ICD-10-CM | POA: Diagnosis not present

## 2015-05-17 DIAGNOSIS — Z992 Dependence on renal dialysis: Secondary | ICD-10-CM | POA: Diagnosis not present

## 2015-05-17 DIAGNOSIS — N186 End stage renal disease: Secondary | ICD-10-CM | POA: Diagnosis not present

## 2015-05-19 DIAGNOSIS — Z992 Dependence on renal dialysis: Secondary | ICD-10-CM | POA: Diagnosis not present

## 2015-05-19 DIAGNOSIS — N186 End stage renal disease: Secondary | ICD-10-CM | POA: Diagnosis not present

## 2015-05-20 DIAGNOSIS — D225 Melanocytic nevi of trunk: Secondary | ICD-10-CM | POA: Diagnosis not present

## 2015-05-20 DIAGNOSIS — C44319 Basal cell carcinoma of skin of other parts of face: Secondary | ICD-10-CM | POA: Diagnosis not present

## 2015-05-20 DIAGNOSIS — C44310 Basal cell carcinoma of skin of unspecified parts of face: Secondary | ICD-10-CM | POA: Diagnosis not present

## 2015-05-20 DIAGNOSIS — C44519 Basal cell carcinoma of skin of other part of trunk: Secondary | ICD-10-CM | POA: Diagnosis not present

## 2015-05-21 DIAGNOSIS — Z992 Dependence on renal dialysis: Secondary | ICD-10-CM | POA: Diagnosis not present

## 2015-05-21 DIAGNOSIS — N186 End stage renal disease: Secondary | ICD-10-CM | POA: Diagnosis not present

## 2015-05-24 DIAGNOSIS — Z992 Dependence on renal dialysis: Secondary | ICD-10-CM | POA: Diagnosis not present

## 2015-05-24 DIAGNOSIS — N186 End stage renal disease: Secondary | ICD-10-CM | POA: Diagnosis not present

## 2015-05-26 DIAGNOSIS — N186 End stage renal disease: Secondary | ICD-10-CM | POA: Diagnosis not present

## 2015-05-26 DIAGNOSIS — Z992 Dependence on renal dialysis: Secondary | ICD-10-CM | POA: Diagnosis not present

## 2015-05-28 DIAGNOSIS — Z992 Dependence on renal dialysis: Secondary | ICD-10-CM | POA: Diagnosis not present

## 2015-05-28 DIAGNOSIS — N186 End stage renal disease: Secondary | ICD-10-CM | POA: Diagnosis not present

## 2015-05-31 DIAGNOSIS — N186 End stage renal disease: Secondary | ICD-10-CM | POA: Diagnosis not present

## 2015-05-31 DIAGNOSIS — Z992 Dependence on renal dialysis: Secondary | ICD-10-CM | POA: Diagnosis not present

## 2015-06-02 DIAGNOSIS — Z992 Dependence on renal dialysis: Secondary | ICD-10-CM | POA: Diagnosis not present

## 2015-06-02 DIAGNOSIS — N186 End stage renal disease: Secondary | ICD-10-CM | POA: Diagnosis not present

## 2015-06-03 DIAGNOSIS — C44612 Basal cell carcinoma of skin of right upper limb, including shoulder: Secondary | ICD-10-CM | POA: Diagnosis not present

## 2015-06-03 DIAGNOSIS — Z08 Encounter for follow-up examination after completed treatment for malignant neoplasm: Secondary | ICD-10-CM | POA: Diagnosis not present

## 2015-06-03 DIAGNOSIS — Z85828 Personal history of other malignant neoplasm of skin: Secondary | ICD-10-CM | POA: Diagnosis not present

## 2015-06-03 DIAGNOSIS — C44519 Basal cell carcinoma of skin of other part of trunk: Secondary | ICD-10-CM | POA: Diagnosis not present

## 2015-06-04 ENCOUNTER — Encounter: Payer: Self-pay | Admitting: Cardiovascular Disease

## 2015-06-04 ENCOUNTER — Ambulatory Visit (INDEPENDENT_AMBULATORY_CARE_PROVIDER_SITE_OTHER): Payer: Medicare HMO | Admitting: Cardiovascular Disease

## 2015-06-04 VITALS — BP 94/68 | HR 107 | Resp 16 | Ht 63.0 in | Wt 213.8 lb

## 2015-06-04 DIAGNOSIS — N2889 Other specified disorders of kidney and ureter: Secondary | ICD-10-CM

## 2015-06-04 DIAGNOSIS — Z79899 Other long term (current) drug therapy: Secondary | ICD-10-CM | POA: Diagnosis not present

## 2015-06-04 DIAGNOSIS — N186 End stage renal disease: Secondary | ICD-10-CM | POA: Diagnosis not present

## 2015-06-04 DIAGNOSIS — I4891 Unspecified atrial fibrillation: Secondary | ICD-10-CM | POA: Diagnosis not present

## 2015-06-04 DIAGNOSIS — I151 Hypertension secondary to other renal disorders: Secondary | ICD-10-CM

## 2015-06-04 DIAGNOSIS — E785 Hyperlipidemia, unspecified: Secondary | ICD-10-CM

## 2015-06-04 DIAGNOSIS — I5032 Chronic diastolic (congestive) heart failure: Secondary | ICD-10-CM

## 2015-06-04 DIAGNOSIS — Z992 Dependence on renal dialysis: Secondary | ICD-10-CM | POA: Diagnosis not present

## 2015-06-04 DIAGNOSIS — Z7901 Long term (current) use of anticoagulants: Secondary | ICD-10-CM

## 2015-06-04 DIAGNOSIS — E782 Mixed hyperlipidemia: Secondary | ICD-10-CM

## 2015-06-04 NOTE — Patient Instructions (Signed)
Your physician recommends that you return for lab work in: [place] NEXT Monday, Wednesday AND Friday AND CALL THE OFFICE WITH THE RATES - [phone]  Dr. Sallyanne Kuster recommends that you schedule a follow-up appointment in: [place]

## 2015-06-05 ENCOUNTER — Encounter: Payer: Self-pay | Admitting: Cardiovascular Disease

## 2015-06-05 NOTE — Progress Notes (Signed)
Patient ID: Sabrina Beck, female   DOB: 1941-03-30, 74 y.o.   MRN: [id number]       Cardiology Office Note   Date:  06/05/2015   ID:  Sabrina Beck, DOB 11/14/1940, MRN [id number]  PCP:  Glo Herring., MD  Cardiologist:   Sanda Klein, MD   Chief Complaint  Patient presents with  . Annual Exam    No chest pain, SOB or edema.  Occas. dizziness after dialysis      History of Present Illness: Sabrina Beck is a 74 y.o. female who presents for permanent atrial fibrillation, systemic hypertension and hyperlipidemia, but without coronary disease or major structural heart problems. She did not have evidence of left ventricular systolic or diastolic dysfunction by echo in 2012, although the left atrium was described as being mildly to moderately dilated. There were no serious valvular abnormalities. She had a normal nuclear stress test in 2007 and has never had complaints of angina. She has been taking chronic warfarin anticoagulation for several years, without a history of bleeding complications and without a history of stroke, TIA or other embolic events.  She has ESRD on hemodialysis MWF via a left forearm AV fistula. She does not take her beta blocker on hemodialysis mornings and now rarely becomes hypotensive at the end of dialysis.  Otherwise, she is asymptomatic from a cardiac standpoint. She has never been really aware of her arrhythmia. She had heart failure before initiation of dialysis.  No heart failure since she began hemodialysis. She is a little tachycardic today at 107 but she came to our office almost directly from hemodialysis. She had a a few skin cancers removed from her face, back and shoulder recent. No serious bleeding problems. She is compliant with chronic warfarin therapy.  Past Medical History  Diagnosis Date  . Anemia 03/27/2011  . Atrial fibrillation ([place])   . Murmur 06/04/2006,09/29/10    2D Echo perform both test shows EF 55%  . Chest pain 05/15/2006    stress test perform negative for ischemia  . Systemic hypertension   . Mixed hyperlipidemia   . Cancer ([place])     uterine  . Complication of anesthesia   . PONV (postoperative nausea and vomiting)   . Dysrhythmia     afib  . Renal disorder   . Kidney disease     stage 4 chronic  . GERD (gastroesophageal reflux disease)   . Diverticulosis   . CHF (congestive heart failure) ([place])   . Family history of anesthesia complication     "brother has nausea when put to sleep."  . Shortness of breath     with exertion  . Headache     Past Surgical History  Procedure Laterality Date  . Cholecystectomy    . Abdominal hysterectomy    . Breast surgery Left     tumors non canerous 2- 3 different surgeries  . Knee arthroscopy    . Av fistula placement Left 11/17/2013    Procedure: CREATION OF LEFT RADIOCEPHALIC ARTERIOVENOUS (AV) FISTULA ;  Surgeon: [name], MD;  Location: [place];  Service: Vascular;  Laterality: Left;  . Appendectomy    . Colonoscopy w/ biopsies and polypectomy    . Insertion of dialysis catheter N/A 01/24/2014    Procedure: INSERTION OF DIALYSIS CATHETER;  Surgeon: Rosetta Posner, MD;  Location: [place];  Service: Vascular;  Laterality: N/A;  . Tubal ligation    . Fistula superficialization Left 05/19/2014    Procedure: FISTULA SUPERFICIALIZATION-LEFT  ARM;  Surgeon: [name], MD;  Location: [facility];  Service: Vascular;  Laterality: Left;     Current Outpatient Prescriptions  Medication Sig Dispense Refill  . acetaminophen (TYLENOL) 500 MG tablet Take 1,000 mg by mouth every 6 (six) hours as needed for moderate pain.     . calcium acetate (PHOSLO) 667 MG capsule Take 2 capsules by mouth 3 (three) times daily.    . calcium carbonate (OS-CAL) 600 MG TABS tablet Take 1,200-2,400 mg by mouth 3 (three) times daily. Take 2400 mg by mouth with every meal and take 1200 mg by mouth with snacks    . Cholecalciferol (VITAMIN D) 2000 UNITS CAPS Take 1 capsule by mouth daily.     . metoprolol (LOPRESSOR) 50 MG tablet Take 1 tablet (50 mg total) by mouth 2 (two) times daily. 60 tablet 7  . Multiple Vitamins-Minerals (PRESERVISION AREDS PO) Take by mouth daily.    . multivitamin (RENA-VIT) TABS tablet Take 1 tablet by mouth daily.    . Omega-3 Fatty Acids (FISH OIL) 1200 MG CAPS Take 1,200 mg by mouth 2 (two) times daily.     Marland Kitchen omeprazole (PRILOSEC) 20 MG capsule Take 20 mg by mouth daily.    . pravastatin (PRAVACHOL) 40 MG tablet TAKE ONE TABLET DAILY AT BEDTIME 90 tablet 0  . Propylene Glycol (SYSTANE BALANCE) 0.6 % SOLN Place 1 drop into both eyes as needed (for burning or dry eyes).    . RENVELA 800 MG tablet Take 1,600 mg by mouth 3 (three) times daily.    Marland Kitchen warfarin (COUMADIN) 5 MG tablet TAKE ONE TABLET BY MOUTH DAILY OR AS DIRECTED BY COUMADIN CLINIC 30 tablet 2   No current facility-administered medications for this visit.    Allergies:   Review of patient's allergies indicates no known allergies.    Social History:  The patient  reports that she quit smoking about 15 years ago. She has never used smokeless tobacco. She reports that she does not drink alcohol or use illicit drugs.   Family History:  The patient's family history includes Cancer in her brother; Diabetes in her brother, father, mother, and sister; Heart attack in her brother and father; Heart disease in her brother and father; Hyperlipidemia in her sister; Hypertension in her brother, mother, and sister.    ROS:  Please see the history of present illness.    Otherwise, review of systems positive for none.   All other systems are reviewed and negative.    PHYSICAL EXAM: VS:  BP 94/68 mmHg  Pulse 107  Resp 16  Ht 5\' 3"  (1.6 m)  Wt 213 lb 12.8 oz (96.979 kg)  BMI 37.88 kg/m2 , BMI Body mass index is 37.88 kg/(m^2).  General: Alert, oriented x3, no distress Head: no evidence of trauma, PERRL, EOMI, no exophtalmos or lid lag, no myxedema, no xanthelasma; normal ears, nose and  oropharynx Neck: normal jugular venous pulsations and no hepatojugular reflux; brisk carotid pulses without delay and no carotid bruits Chest: clear to auscultation, no signs of consolidation by percussion or palpation, normal fremitus, symmetrical and full respiratory excursions Cardiovascular: normal position and quality of the apical impulse, irregular rhythm, normal first and second heart sounds, no murmurs, rubs or gallops Abdomen: no tenderness or distention, no masses by palpation, no abnormal pulsatility or arterial bruits, normal bowel sounds, no hepatosplenomegaly Extremities: Left forearm fistula thrill and bruit; no clubbing, cyanosis or edema; 2+ radial, ulnar and brachial pulses bilaterally; 2+ right femoral, posterior  tibial and dorsalis pedis pulses; 2+ left femoral, posterior tibial and dorsalis pedis pulses; no subclavian or femoral bruits Neurological: grossly nonfocal Psych: euthymic mood, full affect   EKG:  EKG is ordered today. The ekg ordered today demonstrates  Atrial fibrillation with rapid ventricular response, some criteria for left atrial hypertrophy, leftward axis   Recent Labs: No results found for requested labs within last 365 days.    Lipid Panel No results found for: CHOL, TRIG, HDL, CHOLHDL, VLDL, LDLCALC, LDLDIRECT    Wt Readings from Last 3 Encounters:  06/04/15 213 lb 12.8 oz (96.979 kg)  09/05/14 200 lb (90.719 kg)  07/16/14 203 lb 8 oz (92.307 kg)   .   ASSESSMENT AND PLAN:  HTN (hypertension)  Blood pressure control is good. Suspect she is hypotensive/tachycardic following dialysis.   Atrial fibrillation  Rate control is usually appropriate. No problems with the anticoagulation regimen. Asymptomatic. No history of embolic events.  End-stage renal disease   Mixed hyperlipidemia  It's time to repeat her lipid profile. This can be drawn with her next labs at dialysis  Diastolic heart failure, chronic No problems since dialysis  initiation  Current medicines are reviewed at length with the patient today.  The patient does not have concerns regarding medicines.  The following changes have been made:  no change  Labs/ tests ordered today include:  Orders Placed This Encounter  Procedures  . Comprehensive metabolic panel  . Lipid panel  . EKG 12-Lead    Patient Instructions  Your physician recommends that you return for lab work in: [place] NEXT Monday, Wednesday AND Friday AND CALL THE OFFICE WITH THE RATES - [phone]  Dr. Sallyanne Kuster recommends that you schedule a follow-up appointment in: ONE YEAR        SignedSanda Klein, MD  06/05/2015 5:48 PM    Sanda Klein, MD, [facility] [phone] office [phone] pager

## 2015-06-07 DIAGNOSIS — N186 End stage renal disease: Secondary | ICD-10-CM | POA: Diagnosis not present

## 2015-06-07 DIAGNOSIS — Z992 Dependence on renal dialysis: Secondary | ICD-10-CM | POA: Diagnosis not present

## 2015-06-08 ENCOUNTER — Other Ambulatory Visit: Payer: Self-pay | Admitting: Cardiovascular Disease

## 2015-06-08 DIAGNOSIS — Z79899 Other long term (current) drug therapy: Secondary | ICD-10-CM | POA: Diagnosis not present

## 2015-06-08 DIAGNOSIS — E785 Hyperlipidemia, unspecified: Secondary | ICD-10-CM | POA: Diagnosis not present

## 2015-06-09 ENCOUNTER — Telehealth: Payer: Self-pay | Admitting: *Deleted

## 2015-06-09 ENCOUNTER — Other Ambulatory Visit: Payer: Self-pay | Admitting: *Deleted

## 2015-06-09 DIAGNOSIS — N186 End stage renal disease: Secondary | ICD-10-CM | POA: Diagnosis not present

## 2015-06-09 DIAGNOSIS — Z79899 Other long term (current) drug therapy: Secondary | ICD-10-CM

## 2015-06-09 DIAGNOSIS — E785 Hyperlipidemia, unspecified: Secondary | ICD-10-CM

## 2015-06-09 DIAGNOSIS — Z992 Dependence on renal dialysis: Secondary | ICD-10-CM | POA: Diagnosis not present

## 2015-06-09 LAB — COMPREHENSIVE METABOLIC PANEL
ALT: 19 IU/L (ref 0–32)
AST: 13 IU/L (ref 0–40)
Albumin/Globulin Ratio: 1.8 (ref 1.1–2.5)
Albumin: 3.9 g/dL (ref 3.5–4.8)
Alkaline Phosphatase: 79 IU/L (ref 39–117)
BILIRUBIN TOTAL: 0.5 mg/dL (ref 0.0–1.2)
BUN/Creatinine Ratio: 5 — ABNORMAL LOW (ref 11–26)
BUN: 37 mg/dL — ABNORMAL HIGH (ref 8–27)
CALCIUM: 8.8 mg/dL (ref 8.7–10.3)
CHLORIDE: 96 mmol/L — AB (ref 97–106)
CO2: 25 mmol/L (ref 18–29)
Creatinine, Ser: 7.71 mg/dL — ABNORMAL HIGH (ref 0.57–1.00)
GFR calc non Af Amer: 5 mL/min/{1.73_m2} — ABNORMAL LOW (ref >59)
GFR, EST AFRICAN AMERICAN: 5 mL/min/{1.73_m2} — AB (ref >59)
GLOBULIN, TOTAL: 2.2 g/dL (ref 1.5–4.5)
Glucose: 90 mg/dL (ref 65–99)
POTASSIUM: 4 mmol/L (ref 3.5–5.2)
Sodium: 142 mmol/L (ref 136–144)
Total Protein: 6.1 g/dL (ref 6.0–8.5)

## 2015-06-09 LAB — LIPID PANEL W/O CHOL/HDL RATIO
Cholesterol, Total: 87 mg/dL — ABNORMAL LOW (ref 100–199)
HDL: 25 mg/dL — ABNORMAL LOW (ref >39)
LDL Calculated: 8 mg/dL (ref 0–99)
Triglycerides: 272 mg/dL — ABNORMAL HIGH (ref 0–149)
VLDL Cholesterol Cal: 54 mg/dL — ABNORMAL HIGH (ref 5–40)

## 2015-06-09 NOTE — Telephone Encounter (Signed)
-----   Message from Sanda Klein, MD sent at 06/09/2015  8:47 AM EDT ----- Cholesterol very low, TG high. Stop pravastatin. Recheck in 3 months

## 2015-06-09 NOTE — Telephone Encounter (Signed)
Patient notified of lab results.  Instructed to stop Pravastatin and will recheck lab work in 3 months.  Order placed and mail form to patient.  Patient voiced understanding.

## 2015-06-11 DIAGNOSIS — Z992 Dependence on renal dialysis: Secondary | ICD-10-CM | POA: Diagnosis not present

## 2015-06-11 DIAGNOSIS — N186 End stage renal disease: Secondary | ICD-10-CM | POA: Diagnosis not present

## 2015-06-14 ENCOUNTER — Ambulatory Visit (INDEPENDENT_AMBULATORY_CARE_PROVIDER_SITE_OTHER): Payer: Medicare HMO | Admitting: Neurology

## 2015-06-14 ENCOUNTER — Other Ambulatory Visit (INDEPENDENT_AMBULATORY_CARE_PROVIDER_SITE_OTHER): Payer: Medicare HMO

## 2015-06-14 ENCOUNTER — Encounter: Payer: Self-pay | Admitting: Neurology

## 2015-06-14 VITALS — BP 110/70 | HR 90 | Ht 63.0 in | Wt 213.2 lb

## 2015-06-14 DIAGNOSIS — G609 Hereditary and idiopathic neuropathy, unspecified: Secondary | ICD-10-CM | POA: Diagnosis not present

## 2015-06-14 DIAGNOSIS — N189 Chronic kidney disease, unspecified: Secondary | ICD-10-CM | POA: Diagnosis not present

## 2015-06-14 DIAGNOSIS — R278 Other lack of coordination: Secondary | ICD-10-CM | POA: Diagnosis not present

## 2015-06-14 DIAGNOSIS — N186 End stage renal disease: Secondary | ICD-10-CM

## 2015-06-14 DIAGNOSIS — Z992 Dependence on renal dialysis: Secondary | ICD-10-CM | POA: Diagnosis not present

## 2015-06-14 DIAGNOSIS — G63 Polyneuropathy in diseases classified elsewhere: Secondary | ICD-10-CM

## 2015-06-14 LAB — TSH: TSH: 0.57 u[IU]/mL (ref 0.35–4.50)

## 2015-06-14 LAB — FOLATE: Folate: >23.8 ng/mL (ref >5.9)

## 2015-06-14 NOTE — Patient Instructions (Addendum)
1.  Check blood work 2.  Depending on the results of your blood work, we can schedule electrodiagnostic testing 3.  Return to clinic in 3 months

## 2015-06-14 NOTE — Progress Notes (Signed)
[facility] Clinic Note - Initial Visit   Date: 06/14/2015  Sabrina Beck MRN: [id number] DOB: October 30, 1940   Dear Dr. Gerarda Fraction:  Thank you for your kind referral of Sabrina Beck for consultation of feet paresthesias. Although her history is well known to you, please allow [place] to reiterate it for the purpose of our medical record. The patient was accompanied to the clinic by son who also provides collateral information.     History of Present Illness: Sabrina Beck is a 74 y.o. right-handed Caucasian female with ESRD via left forearm fistula (MWF), h/o uterine cancer s/p hysterectomy, atrial fibrillation on warfarin, hypertension, hyperlipidemia, and GERD presenting for evaluation of feet paresthesias.    Starting around 2015, she developed numbness involving the sole of her feet as if she is always walking on something uneven.  Over the year, she has noticed the numbness creep up her legs.  Symptoms are constant and nothing tends to exacerbate or alleviate her symptoms.  She walks independently and has not suffered any falls.  She denies any problems with balance.  Sometimes, she has some cold sensation of thehands.   No weakness.  She has been on hemodialysis for CKD for over the past year.  Underlying etiology for renal disease is unclear.  There is no history of diabetes or alcohol use.   Out-side paper records, electronic medical record, and images have been reviewed where available and summarized as:  CT cervical spine wo contrast 09/05/2014: No CT evidence of acute intracranial abnormality.  No CT evidence of acute fracture or malalignment of the cervical spine.  Mild multilevel degenerative disc disease and facet disease of the cervical spine, as above.  Lucent lesion of the C5 vertebral body, uncertain significance. Recommend correlation with a history of prior malignancy, including multiple myeloma.  Past Medical History  Diagnosis Date  .  Anemia 03/27/2011  . Atrial fibrillation ([place])   . Murmur 06/04/2006,09/29/10    2D Echo perform both test shows EF 55%  . Chest pain 05/15/2006    stress test perform negative for ischemia  . Systemic hypertension   . Mixed hyperlipidemia   . Cancer ([place])     uterine  . Complication of anesthesia   . PONV (postoperative nausea and vomiting)   . Dysrhythmia     afib  . Renal disorder   . Kidney disease     stage 4 chronic  . GERD (gastroesophageal reflux disease)   . Diverticulosis   . CHF (congestive heart failure) ([place])   . Family history of anesthesia complication     "brother has nausea when put to sleep."  . Shortness of breath     with exertion  . Headache     Past Surgical History  Procedure Laterality Date  . Cholecystectomy    . Abdominal hysterectomy    . Breast surgery Left     tumors non canerous 2- 3 different surgeries  . Knee arthroscopy    . Av fistula placement Left 11/17/2013    Procedure: CREATION OF LEFT RADIOCEPHALIC ARTERIOVENOUS (AV) FISTULA ;  Surgeon: [name], MD;  Location: [place];  Service: Vascular;  Laterality: Left;  . Appendectomy    . Colonoscopy w/ biopsies and polypectomy    . Insertion of dialysis catheter N/A 01/24/2014    Procedure: INSERTION OF DIALYSIS CATHETER;  Surgeon: Rosetta Posner, MD;  Location: [place];  Service: Vascular;  Laterality: N/A;  . Tubal ligation    .  Fistula superficialization Left 05/19/2014    Procedure: FISTULA SUPERFICIALIZATION-LEFT ARM;  Surgeon: [name], MD;  Location: [facility] OR;  Service: Vascular;  Laterality: Left;     Medications:  Outpatient Encounter Prescriptions as of 06/14/2015  Medication Sig Note  . acetaminophen (TYLENOL) 500 MG tablet Take 1,000 mg by mouth every 6 (six) hours as needed for moderate pain.    . calcium acetate (PHOSLO) 667 MG capsule Take 2 capsules by mouth 3 (three) times daily. 06/04/2015: Received from: External Pharmacy Received Sig:   . Cholecalciferol (VITAMIN  D) 2000 UNITS CAPS Take 1 capsule by mouth daily.   . metoprolol (LOPRESSOR) 50 MG tablet Take 1 tablet (50 mg total) by mouth 2 (two) times daily.   . multivitamin (RENA-VIT) TABS tablet Take 1 tablet by mouth daily.   . Omega-3 Fatty Acids (FISH OIL) 1200 MG CAPS Take 1,200 mg by mouth 2 (two) times daily.    Marland Kitchen omeprazole (PRILOSEC) 20 MG capsule Take 20 mg by mouth daily.   Marland Kitchen Propylene Glycol (SYSTANE BALANCE) 0.6 % SOLN Place 1 drop into both eyes as needed (for burning or dry eyes).   . RENVELA 800 MG tablet Take 1,600 mg by mouth 3 (three) times daily. 06/04/2015: Received from: External Pharmacy Received Sig:   . warfarin (COUMADIN) 5 MG tablet TAKE ONE TABLET BY MOUTH DAILY OR AS DIRECTED BY COUMADIN CLINIC   . [DISCONTINUED] calcium carbonate (OS-CAL) 600 MG TABS tablet Take 1,200-2,400 mg by mouth 3 (three) times daily. Take 2400 mg by mouth with every meal and take 1200 mg by mouth with snacks   . [DISCONTINUED] Multiple Vitamins-Minerals (PRESERVISION AREDS PO) Take by mouth daily.   . [DISCONTINUED] pravastatin (PRAVACHOL) 40 MG tablet  06/14/2015: Received from: External Pharmacy   No facility-administered encounter medications on file as of 06/14/2015.     Allergies: No Known Allergies  Family History: Family History  Problem Relation Age of Onset  . Diabetes Mother   . Hypertension Mother   . Diabetes Father   . Heart disease Father     before age 51  . Heart attack Father   . Diabetes Sister   . Hyperlipidemia Sister   . Hypertension Sister   . Cancer Brother   . Diabetes Brother   . Heart disease Brother   . Hypertension Brother   . Heart attack Brother     Social History: Social History  Substance Use Topics  . Smoking status: Former Smoker    Quit date: 08/08/1999  . Smokeless tobacco: Never Used  . Alcohol Use: No   Social History   Social History Narrative   Lives in a one story home.  One of her sons lives with her.  Has 2 sons.  Retired Forensic psychologist.  Eduction: high school.    Review of Systems:  CONSTITUTIONAL: No fevers, chills, night sweats, or weight loss.   EYES: No visual changes or eye pain ENT: No hearing changes.  No history of nose bleeds.   RESPIRATORY: No cough, wheezing and shortness of breath.   CARDIOVASCULAR: Negative for chest pain, and palpitations.   GI: Negative for abdominal discomfort, blood in stools or black stools.  No recent change in bowel habits.   GU:  No history of incontinence.   MUSCLOSKELETAL: +history of joint pain or swelling.  No myalgias.   SKIN: Negative for lesions, rash, and itching.   HEMATOLOGY/ONCOLOGY: Negative for prolonged bleeding, bruising easily, and swollen nodes.  +history of cancer.  ENDOCRINE: Negative for cold or heat intolerance, polydipsia or goiter.   PSYCH:  No depression or anxiety symptoms.   NEURO: As Above.   Vital Signs:  BP 110/70 mmHg  Pulse 90  Ht 5' 3"  (1.6 m)  Wt 213 lb 3 oz (96.701 kg)  BMI 37.77 kg/m2  SpO2 98% Pain Scale: 0 on a scale of 0-10   General Medical Exam:   General:  Well appearing, comfortable.   Eyes/ENT: see cranial nerve examination.   Neck: No masses appreciated.  Full range of motion without tenderness.  No carotid bruits. Respiratory:  Clear to auscultation, good air entry bilaterally.   Cardiac:  Irregularly irregular, no murmur.   Extremities:  No deformities, edema, or skin discoloration.  Skin:  No rashes or lesions.  Neurological Exam: MENTAL STATUS including orientation to time, place, person, recent and remote memory, attention span and concentration, language, and fund of knowledge is normal.  Speech is not dysarthric.  CRANIAL NERVES: II:  No visual field defects.  Unremarkable fundi.   III-IV-VI: Pupils equal round and reactive to light.  Normal conjugate, extra-ocular eye movements in all directions of gaze.  No nystagmus.  No ptosis.   V:  Normal facial sensation.     VII:  Normal facial symmetry and movements.   No pathologic facial reflexes.  VIII:  Normal hearing and vestibular function.   IX-X:  Normal palatal movement.   XI:  Normal shoulder shrug and head rotation.   XII:  Normal tongue strength and range of motion, no deviation or fasciculation.  MOTOR:  No atrophy, fasciculations or abnormal movements.  No pronator drift.  Tone is normal.    Right Upper Extremity:    Left Upper Extremity:    Deltoid  5/5   Deltoid  5/5   Biceps  5/5   Biceps  5/5   Triceps  5/5   Triceps  5/5   Wrist extensors  5/5   Wrist extensors  5/5   Wrist flexors  5/5   Wrist flexors  5/5   Finger extensors  5/5   Finger extensors  5/5   Finger flexors  5/5   Finger flexors  5/5   Dorsal interossei  5/5   Dorsal interossei  5/5   Abductor pollicis  5/5   Abductor pollicis  5/5   Tone (Ashworth scale)  0  Tone (Ashworth scale)  0   Right Lower Extremity:    Left Lower Extremity:    Hip flexors  5/5   Hip flexors  5/5   Hip extensors  5/5   Hip extensors  5/5   Knee flexors  5/5   Knee flexors  5/5   Knee extensors  5/5   Knee extensors  5/5   Dorsiflexors  5/5   Dorsiflexors  5/5   Plantarflexors  5/5   Plantarflexors  5/5   Toe extensors  5/5   Toe extensors  5/5   Toe flexors  5-/5   Toe flexors  5-/5   Tone (Ashworth scale)  0  Tone (Ashworth scale)  0   MSRs:  Right                                                                 Left  brachioradialis 2+  brachioradialis 2+  biceps 2+  biceps 2+  triceps 2+  triceps 2+  patellar 2+  patellar 2+  ankle jerk trace  ankle jerk trace  Hoffman no  Hoffman no  plantar response down  plantar response down   SENSORY:  Reduced temperature and vibration distal to ankles.  Pin prick is reduced from mid-calf down into feet.  There is mild sway with Rhomberg testing.  Sensation is intact in the upper extremities.   COORDINATION/GAIT: Normal finger-to- nose-finger.  Intact rapid alternating movements bilaterally.  Able to rise from a chair without using arms.   Gait narrow based and stable. She is unable to perform tandem or stressed gait.    IMPRESSION: Sabrina Beck is a 74 year-old female with ESRD on hemodialysis referred for evaluation of bilateral feet paresthesias. 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 I had extensive discussion with the patient regarding the pathogenesis, etiology, management, and natural course of neuropathy. 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  For completeness, I will check for other treatable causes of neuropathy.  Because there has been worsening of symptoms over one year and she also describes hand paresthesias, I recommend NCS/EMG to be sure a demyelinating neuropathy is not missed.   PLAN/RECOMMENDATIONS:  1.  Check TSH, vitamin B12, copper, zinc, folate, SPEP with IFE 2.  Recommend NCS/EMG of the right side, but they would like to wait for results of blood work, prior to scheduling this  Return to clinic in 3 months.   The duration of this appointment visit was 40 minutes of face-to-face time with the patient.  Greater than 50% of this time was spent in counseling, explanation of diagnosis, planning of further management, and coordination of care.   Thank you for allowing me to participate in patient's care.  If I can answer any additional questions, I would be pleased to do so.    Sincerely,    Kohl Polinsky K. Posey Pronto, DO

## 2015-06-14 NOTE — Progress Notes (Signed)
Note routed

## 2015-06-15 LAB — VITAMIN B12: VITAMIN B 12: 845 pg/mL (ref 211–911)

## 2015-06-16 DIAGNOSIS — Z992 Dependence on renal dialysis: Secondary | ICD-10-CM | POA: Diagnosis not present

## 2015-06-16 DIAGNOSIS — N186 End stage renal disease: Secondary | ICD-10-CM | POA: Diagnosis not present

## 2015-06-17 LAB — COPPER, SERUM: Copper: 72 ug/dL (ref 70–175)

## 2015-06-17 LAB — ZINC: Zinc: 74 ug/dL (ref 60–130)

## 2015-06-18 DIAGNOSIS — Z992 Dependence on renal dialysis: Secondary | ICD-10-CM | POA: Diagnosis not present

## 2015-06-18 DIAGNOSIS — N186 End stage renal disease: Secondary | ICD-10-CM | POA: Diagnosis not present

## 2015-06-18 LAB — SPEP & IFE WITH QIG
Abnormal Protein Band1: 0.1 g/dL
Albumin ELP: 4.3 g/dL (ref 3.8–4.8)
Alpha-1-Globulin: 0.2 g/dL (ref 0.2–0.3)
Alpha-2-Globulin: 0.5 g/dL (ref 0.5–0.9)
BETA 2: 0.3 g/dL (ref 0.2–0.5)
BETA GLOBULIN: 0.4 g/dL (ref 0.4–0.6)
Gamma Globulin: 0.9 g/dL (ref 0.8–1.7)
IgA: 83 mg/dL (ref 69–380)
IgG (Immunoglobin G), Serum: 1160 mg/dL (ref 690–1700)
IgM, Serum: 57 mg/dL (ref 52–322)
TOTAL PROTEIN, SERUM ELECTROPHOR: 6.7 g/dL (ref 6.1–8.1)

## 2015-06-21 DIAGNOSIS — Z992 Dependence on renal dialysis: Secondary | ICD-10-CM | POA: Diagnosis not present

## 2015-06-21 DIAGNOSIS — N186 End stage renal disease: Secondary | ICD-10-CM | POA: Diagnosis not present

## 2015-06-23 DIAGNOSIS — N186 End stage renal disease: Secondary | ICD-10-CM | POA: Diagnosis not present

## 2015-06-23 DIAGNOSIS — Z992 Dependence on renal dialysis: Secondary | ICD-10-CM | POA: Diagnosis not present

## 2015-06-25 DIAGNOSIS — Z992 Dependence on renal dialysis: Secondary | ICD-10-CM | POA: Diagnosis not present

## 2015-06-25 DIAGNOSIS — N186 End stage renal disease: Secondary | ICD-10-CM | POA: Diagnosis not present

## 2015-06-28 DIAGNOSIS — N186 End stage renal disease: Secondary | ICD-10-CM | POA: Diagnosis not present

## 2015-06-28 DIAGNOSIS — Z992 Dependence on renal dialysis: Secondary | ICD-10-CM | POA: Diagnosis not present

## 2015-06-29 ENCOUNTER — Other Ambulatory Visit: Payer: Self-pay | Admitting: *Deleted

## 2015-06-29 ENCOUNTER — Telehealth: Payer: Self-pay | Admitting: Neurology

## 2015-06-29 DIAGNOSIS — G63 Polyneuropathy in diseases classified elsewhere: Secondary | ICD-10-CM

## 2015-06-29 DIAGNOSIS — G609 Hereditary and idiopathic neuropathy, unspecified: Secondary | ICD-10-CM

## 2015-06-29 DIAGNOSIS — N189 Chronic kidney disease, unspecified: Secondary | ICD-10-CM

## 2015-06-29 DIAGNOSIS — R278 Other lack of coordination: Secondary | ICD-10-CM

## 2015-06-29 NOTE — Telephone Encounter (Signed)
Patient notified to go to [facility] and orders mailed to her.

## 2015-06-29 NOTE — Telephone Encounter (Signed)
Patient informed that SPEP shows a monoprotein protein in the beta-1 region. We will check UPEP with IFE and add kappa/lambda light chains.

## 2015-06-30 DIAGNOSIS — Z992 Dependence on renal dialysis: Secondary | ICD-10-CM | POA: Diagnosis not present

## 2015-06-30 DIAGNOSIS — N186 End stage renal disease: Secondary | ICD-10-CM | POA: Diagnosis not present

## 2015-07-02 DIAGNOSIS — N186 End stage renal disease: Secondary | ICD-10-CM | POA: Diagnosis not present

## 2015-07-02 DIAGNOSIS — Z992 Dependence on renal dialysis: Secondary | ICD-10-CM | POA: Diagnosis not present

## 2015-07-05 DIAGNOSIS — Z992 Dependence on renal dialysis: Secondary | ICD-10-CM | POA: Diagnosis not present

## 2015-07-05 DIAGNOSIS — N186 End stage renal disease: Secondary | ICD-10-CM | POA: Diagnosis not present

## 2015-07-07 DIAGNOSIS — Z992 Dependence on renal dialysis: Secondary | ICD-10-CM | POA: Diagnosis not present

## 2015-07-07 DIAGNOSIS — N186 End stage renal disease: Secondary | ICD-10-CM | POA: Diagnosis not present

## 2015-07-08 DIAGNOSIS — C44310 Basal cell carcinoma of skin of unspecified parts of face: Secondary | ICD-10-CM | POA: Diagnosis not present

## 2015-07-08 DIAGNOSIS — C44519 Basal cell carcinoma of skin of other part of trunk: Secondary | ICD-10-CM | POA: Diagnosis not present

## 2015-07-08 DIAGNOSIS — C44319 Basal cell carcinoma of skin of other parts of face: Secondary | ICD-10-CM | POA: Diagnosis not present

## 2015-07-09 DIAGNOSIS — Z992 Dependence on renal dialysis: Secondary | ICD-10-CM | POA: Diagnosis not present

## 2015-07-09 DIAGNOSIS — N186 End stage renal disease: Secondary | ICD-10-CM | POA: Diagnosis not present

## 2015-07-12 DIAGNOSIS — Z992 Dependence on renal dialysis: Secondary | ICD-10-CM | POA: Diagnosis not present

## 2015-07-12 DIAGNOSIS — N186 End stage renal disease: Secondary | ICD-10-CM | POA: Diagnosis not present

## 2015-07-14 DIAGNOSIS — Z992 Dependence on renal dialysis: Secondary | ICD-10-CM | POA: Diagnosis not present

## 2015-07-14 DIAGNOSIS — N186 End stage renal disease: Secondary | ICD-10-CM | POA: Diagnosis not present

## 2015-07-16 DIAGNOSIS — Z992 Dependence on renal dialysis: Secondary | ICD-10-CM | POA: Diagnosis not present

## 2015-07-16 DIAGNOSIS — N186 End stage renal disease: Secondary | ICD-10-CM | POA: Diagnosis not present

## 2015-07-19 DIAGNOSIS — N186 End stage renal disease: Secondary | ICD-10-CM | POA: Diagnosis not present

## 2015-07-19 DIAGNOSIS — Z992 Dependence on renal dialysis: Secondary | ICD-10-CM | POA: Diagnosis not present

## 2015-07-21 DIAGNOSIS — N186 End stage renal disease: Secondary | ICD-10-CM | POA: Diagnosis not present

## 2015-07-21 DIAGNOSIS — Z992 Dependence on renal dialysis: Secondary | ICD-10-CM | POA: Diagnosis not present

## 2015-07-23 DIAGNOSIS — Z992 Dependence on renal dialysis: Secondary | ICD-10-CM | POA: Diagnosis not present

## 2015-07-23 DIAGNOSIS — N186 End stage renal disease: Secondary | ICD-10-CM | POA: Diagnosis not present

## 2015-07-26 DIAGNOSIS — Z992 Dependence on renal dialysis: Secondary | ICD-10-CM | POA: Diagnosis not present

## 2015-07-26 DIAGNOSIS — N186 End stage renal disease: Secondary | ICD-10-CM | POA: Diagnosis not present

## 2015-07-28 DIAGNOSIS — N186 End stage renal disease: Secondary | ICD-10-CM | POA: Diagnosis not present

## 2015-07-28 DIAGNOSIS — Z992 Dependence on renal dialysis: Secondary | ICD-10-CM | POA: Diagnosis not present

## 2015-07-30 DIAGNOSIS — Z992 Dependence on renal dialysis: Secondary | ICD-10-CM | POA: Diagnosis not present

## 2015-07-30 DIAGNOSIS — N186 End stage renal disease: Secondary | ICD-10-CM | POA: Diagnosis not present

## 2015-08-02 DIAGNOSIS — Z992 Dependence on renal dialysis: Secondary | ICD-10-CM | POA: Diagnosis not present

## 2015-08-02 DIAGNOSIS — N186 End stage renal disease: Secondary | ICD-10-CM | POA: Diagnosis not present

## 2015-08-04 DIAGNOSIS — Z992 Dependence on renal dialysis: Secondary | ICD-10-CM | POA: Diagnosis not present

## 2015-08-04 DIAGNOSIS — N186 End stage renal disease: Secondary | ICD-10-CM | POA: Diagnosis not present

## 2015-08-05 DIAGNOSIS — L723 Sebaceous cyst: Secondary | ICD-10-CM | POA: Diagnosis not present

## 2015-08-05 DIAGNOSIS — Z85828 Personal history of other malignant neoplasm of skin: Secondary | ICD-10-CM | POA: Diagnosis not present

## 2015-08-05 DIAGNOSIS — Z08 Encounter for follow-up examination after completed treatment for malignant neoplasm: Secondary | ICD-10-CM | POA: Diagnosis not present

## 2015-08-06 DIAGNOSIS — Z992 Dependence on renal dialysis: Secondary | ICD-10-CM | POA: Diagnosis not present

## 2015-08-06 DIAGNOSIS — N186 End stage renal disease: Secondary | ICD-10-CM | POA: Diagnosis not present

## 2015-08-07 DIAGNOSIS — N186 End stage renal disease: Secondary | ICD-10-CM | POA: Diagnosis not present

## 2015-08-07 DIAGNOSIS — Z992 Dependence on renal dialysis: Secondary | ICD-10-CM | POA: Diagnosis not present

## 2015-08-09 DIAGNOSIS — N186 End stage renal disease: Secondary | ICD-10-CM | POA: Diagnosis not present

## 2015-08-09 DIAGNOSIS — Z992 Dependence on renal dialysis: Secondary | ICD-10-CM | POA: Diagnosis not present

## 2015-08-11 DIAGNOSIS — N186 End stage renal disease: Secondary | ICD-10-CM | POA: Diagnosis not present

## 2015-08-11 DIAGNOSIS — Z992 Dependence on renal dialysis: Secondary | ICD-10-CM | POA: Diagnosis not present

## 2015-08-13 DIAGNOSIS — Z992 Dependence on renal dialysis: Secondary | ICD-10-CM | POA: Diagnosis not present

## 2015-08-13 DIAGNOSIS — N186 End stage renal disease: Secondary | ICD-10-CM | POA: Diagnosis not present

## 2015-08-16 DIAGNOSIS — N186 End stage renal disease: Secondary | ICD-10-CM | POA: Diagnosis not present

## 2015-08-16 DIAGNOSIS — Z992 Dependence on renal dialysis: Secondary | ICD-10-CM | POA: Diagnosis not present

## 2015-08-18 DIAGNOSIS — N186 End stage renal disease: Secondary | ICD-10-CM | POA: Diagnosis not present

## 2015-08-18 DIAGNOSIS — Z992 Dependence on renal dialysis: Secondary | ICD-10-CM | POA: Diagnosis not present

## 2015-08-20 DIAGNOSIS — Z992 Dependence on renal dialysis: Secondary | ICD-10-CM | POA: Diagnosis not present

## 2015-08-20 DIAGNOSIS — N186 End stage renal disease: Secondary | ICD-10-CM | POA: Diagnosis not present

## 2015-08-23 ENCOUNTER — Other Ambulatory Visit: Payer: Self-pay | Admitting: Cardiovascular Disease

## 2015-08-23 DIAGNOSIS — Z992 Dependence on renal dialysis: Secondary | ICD-10-CM | POA: Diagnosis not present

## 2015-08-23 DIAGNOSIS — N186 End stage renal disease: Secondary | ICD-10-CM | POA: Diagnosis not present

## 2015-08-24 NOTE — Telephone Encounter (Signed)
Rx request sent to pharmacy.  

## 2015-08-25 DIAGNOSIS — N186 End stage renal disease: Secondary | ICD-10-CM | POA: Diagnosis not present

## 2015-08-25 DIAGNOSIS — Z992 Dependence on renal dialysis: Secondary | ICD-10-CM | POA: Diagnosis not present

## 2015-08-27 DIAGNOSIS — Z992 Dependence on renal dialysis: Secondary | ICD-10-CM | POA: Diagnosis not present

## 2015-08-27 DIAGNOSIS — N186 End stage renal disease: Secondary | ICD-10-CM | POA: Diagnosis not present

## 2015-08-30 DIAGNOSIS — Z992 Dependence on renal dialysis: Secondary | ICD-10-CM | POA: Diagnosis not present

## 2015-08-30 DIAGNOSIS — N186 End stage renal disease: Secondary | ICD-10-CM | POA: Diagnosis not present

## 2015-09-01 DIAGNOSIS — N186 End stage renal disease: Secondary | ICD-10-CM | POA: Diagnosis not present

## 2015-09-01 DIAGNOSIS — Z992 Dependence on renal dialysis: Secondary | ICD-10-CM | POA: Diagnosis not present

## 2015-09-03 DIAGNOSIS — N186 End stage renal disease: Secondary | ICD-10-CM | POA: Diagnosis not present

## 2015-09-03 DIAGNOSIS — Z992 Dependence on renal dialysis: Secondary | ICD-10-CM | POA: Diagnosis not present

## 2015-09-06 DIAGNOSIS — Z992 Dependence on renal dialysis: Secondary | ICD-10-CM | POA: Diagnosis not present

## 2015-09-06 DIAGNOSIS — N186 End stage renal disease: Secondary | ICD-10-CM | POA: Diagnosis not present

## 2015-09-07 DIAGNOSIS — Z992 Dependence on renal dialysis: Secondary | ICD-10-CM | POA: Diagnosis not present

## 2015-09-07 DIAGNOSIS — N186 End stage renal disease: Secondary | ICD-10-CM | POA: Diagnosis not present

## 2015-09-08 DIAGNOSIS — N186 End stage renal disease: Secondary | ICD-10-CM | POA: Diagnosis not present

## 2015-09-08 DIAGNOSIS — Z992 Dependence on renal dialysis: Secondary | ICD-10-CM | POA: Diagnosis not present

## 2015-09-10 DIAGNOSIS — Z992 Dependence on renal dialysis: Secondary | ICD-10-CM | POA: Diagnosis not present

## 2015-09-10 DIAGNOSIS — N186 End stage renal disease: Secondary | ICD-10-CM | POA: Diagnosis not present

## 2015-09-13 DIAGNOSIS — R221 Localized swelling, mass and lump, neck: Secondary | ICD-10-CM | POA: Diagnosis not present

## 2015-09-13 DIAGNOSIS — N186 End stage renal disease: Secondary | ICD-10-CM | POA: Diagnosis not present

## 2015-09-13 DIAGNOSIS — Z992 Dependence on renal dialysis: Secondary | ICD-10-CM | POA: Diagnosis not present

## 2015-09-15 DIAGNOSIS — Z992 Dependence on renal dialysis: Secondary | ICD-10-CM | POA: Diagnosis not present

## 2015-09-15 DIAGNOSIS — N186 End stage renal disease: Secondary | ICD-10-CM | POA: Diagnosis not present

## 2015-09-17 DIAGNOSIS — N186 End stage renal disease: Secondary | ICD-10-CM | POA: Diagnosis not present

## 2015-09-17 DIAGNOSIS — Z992 Dependence on renal dialysis: Secondary | ICD-10-CM | POA: Diagnosis not present

## 2015-09-20 DIAGNOSIS — N186 End stage renal disease: Secondary | ICD-10-CM | POA: Diagnosis not present

## 2015-09-20 DIAGNOSIS — Z992 Dependence on renal dialysis: Secondary | ICD-10-CM | POA: Diagnosis not present

## 2015-09-21 DIAGNOSIS — K1123 Chronic sialoadenitis: Secondary | ICD-10-CM | POA: Diagnosis not present

## 2015-09-22 DIAGNOSIS — Z992 Dependence on renal dialysis: Secondary | ICD-10-CM | POA: Diagnosis not present

## 2015-09-22 DIAGNOSIS — N186 End stage renal disease: Secondary | ICD-10-CM | POA: Diagnosis not present

## 2015-09-24 DIAGNOSIS — N186 End stage renal disease: Secondary | ICD-10-CM | POA: Diagnosis not present

## 2015-09-24 DIAGNOSIS — Z992 Dependence on renal dialysis: Secondary | ICD-10-CM | POA: Diagnosis not present

## 2015-09-25 DIAGNOSIS — N186 End stage renal disease: Secondary | ICD-10-CM | POA: Diagnosis not present

## 2015-09-25 DIAGNOSIS — Z992 Dependence on renal dialysis: Secondary | ICD-10-CM | POA: Diagnosis not present

## 2015-09-27 DIAGNOSIS — N186 End stage renal disease: Secondary | ICD-10-CM | POA: Diagnosis not present

## 2015-09-27 DIAGNOSIS — Z992 Dependence on renal dialysis: Secondary | ICD-10-CM | POA: Diagnosis not present

## 2015-09-29 DIAGNOSIS — N186 End stage renal disease: Secondary | ICD-10-CM | POA: Diagnosis not present

## 2015-09-29 DIAGNOSIS — Z992 Dependence on renal dialysis: Secondary | ICD-10-CM | POA: Diagnosis not present

## 2015-10-01 DIAGNOSIS — Z992 Dependence on renal dialysis: Secondary | ICD-10-CM | POA: Diagnosis not present

## 2015-10-01 DIAGNOSIS — N186 End stage renal disease: Secondary | ICD-10-CM | POA: Diagnosis not present

## 2015-10-04 DIAGNOSIS — N186 End stage renal disease: Secondary | ICD-10-CM | POA: Diagnosis not present

## 2015-10-04 DIAGNOSIS — Z992 Dependence on renal dialysis: Secondary | ICD-10-CM | POA: Diagnosis not present

## 2015-10-06 DIAGNOSIS — Z992 Dependence on renal dialysis: Secondary | ICD-10-CM | POA: Diagnosis not present

## 2015-10-06 DIAGNOSIS — N186 End stage renal disease: Secondary | ICD-10-CM | POA: Diagnosis not present

## 2015-10-08 DIAGNOSIS — Z992 Dependence on renal dialysis: Secondary | ICD-10-CM | POA: Diagnosis not present

## 2015-10-08 DIAGNOSIS — N186 End stage renal disease: Secondary | ICD-10-CM | POA: Diagnosis not present

## 2015-10-11 DIAGNOSIS — Z992 Dependence on renal dialysis: Secondary | ICD-10-CM | POA: Diagnosis not present

## 2015-10-11 DIAGNOSIS — N186 End stage renal disease: Secondary | ICD-10-CM | POA: Diagnosis not present

## 2015-10-13 DIAGNOSIS — N186 End stage renal disease: Secondary | ICD-10-CM | POA: Diagnosis not present

## 2015-10-13 DIAGNOSIS — Z992 Dependence on renal dialysis: Secondary | ICD-10-CM | POA: Diagnosis not present

## 2015-10-15 DIAGNOSIS — N186 End stage renal disease: Secondary | ICD-10-CM | POA: Diagnosis not present

## 2015-10-15 DIAGNOSIS — Z992 Dependence on renal dialysis: Secondary | ICD-10-CM | POA: Diagnosis not present

## 2015-10-18 DIAGNOSIS — N186 End stage renal disease: Secondary | ICD-10-CM | POA: Diagnosis not present

## 2015-10-18 DIAGNOSIS — Z992 Dependence on renal dialysis: Secondary | ICD-10-CM | POA: Diagnosis not present

## 2015-10-19 DIAGNOSIS — E7800 Pure hypercholesterolemia, unspecified: Secondary | ICD-10-CM | POA: Insufficient documentation

## 2015-10-19 DIAGNOSIS — Z992 Dependence on renal dialysis: Secondary | ICD-10-CM | POA: Insufficient documentation

## 2015-10-20 DIAGNOSIS — Z992 Dependence on renal dialysis: Secondary | ICD-10-CM | POA: Diagnosis not present

## 2015-10-20 DIAGNOSIS — N186 End stage renal disease: Secondary | ICD-10-CM | POA: Diagnosis not present

## 2015-10-22 DIAGNOSIS — Z992 Dependence on renal dialysis: Secondary | ICD-10-CM | POA: Diagnosis not present

## 2015-10-22 DIAGNOSIS — N186 End stage renal disease: Secondary | ICD-10-CM | POA: Diagnosis not present

## 2015-10-25 DIAGNOSIS — N186 End stage renal disease: Secondary | ICD-10-CM | POA: Diagnosis not present

## 2015-10-25 DIAGNOSIS — Z992 Dependence on renal dialysis: Secondary | ICD-10-CM | POA: Diagnosis not present

## 2015-10-27 DIAGNOSIS — N186 End stage renal disease: Secondary | ICD-10-CM | POA: Diagnosis not present

## 2015-10-27 DIAGNOSIS — Z992 Dependence on renal dialysis: Secondary | ICD-10-CM | POA: Diagnosis not present

## 2015-10-29 DIAGNOSIS — Z992 Dependence on renal dialysis: Secondary | ICD-10-CM | POA: Diagnosis not present

## 2015-10-29 DIAGNOSIS — N186 End stage renal disease: Secondary | ICD-10-CM | POA: Diagnosis not present

## 2015-11-01 DIAGNOSIS — N186 End stage renal disease: Secondary | ICD-10-CM | POA: Diagnosis not present

## 2015-11-01 DIAGNOSIS — Z992 Dependence on renal dialysis: Secondary | ICD-10-CM | POA: Diagnosis not present

## 2015-11-03 DIAGNOSIS — Z992 Dependence on renal dialysis: Secondary | ICD-10-CM | POA: Diagnosis not present

## 2015-11-03 DIAGNOSIS — N186 End stage renal disease: Secondary | ICD-10-CM | POA: Diagnosis not present

## 2015-11-05 DIAGNOSIS — N186 End stage renal disease: Secondary | ICD-10-CM | POA: Diagnosis not present

## 2015-11-05 DIAGNOSIS — Z992 Dependence on renal dialysis: Secondary | ICD-10-CM | POA: Diagnosis not present

## 2015-11-08 DIAGNOSIS — N186 End stage renal disease: Secondary | ICD-10-CM | POA: Diagnosis not present

## 2015-11-08 DIAGNOSIS — Z992 Dependence on renal dialysis: Secondary | ICD-10-CM | POA: Diagnosis not present

## 2015-11-10 DIAGNOSIS — N186 End stage renal disease: Secondary | ICD-10-CM | POA: Diagnosis not present

## 2015-11-10 DIAGNOSIS — Z992 Dependence on renal dialysis: Secondary | ICD-10-CM | POA: Diagnosis not present

## 2015-11-12 DIAGNOSIS — Z992 Dependence on renal dialysis: Secondary | ICD-10-CM | POA: Diagnosis not present

## 2015-11-12 DIAGNOSIS — N186 End stage renal disease: Secondary | ICD-10-CM | POA: Diagnosis not present

## 2015-11-15 DIAGNOSIS — N186 End stage renal disease: Secondary | ICD-10-CM | POA: Diagnosis not present

## 2015-11-15 DIAGNOSIS — Z992 Dependence on renal dialysis: Secondary | ICD-10-CM | POA: Diagnosis not present

## 2015-11-17 DIAGNOSIS — Z992 Dependence on renal dialysis: Secondary | ICD-10-CM | POA: Diagnosis not present

## 2015-11-17 DIAGNOSIS — N186 End stage renal disease: Secondary | ICD-10-CM | POA: Diagnosis not present

## 2015-11-19 DIAGNOSIS — N186 End stage renal disease: Secondary | ICD-10-CM | POA: Diagnosis not present

## 2015-11-19 DIAGNOSIS — Z992 Dependence on renal dialysis: Secondary | ICD-10-CM | POA: Diagnosis not present

## 2015-11-22 DIAGNOSIS — Z992 Dependence on renal dialysis: Secondary | ICD-10-CM | POA: Diagnosis not present

## 2015-11-22 DIAGNOSIS — N186 End stage renal disease: Secondary | ICD-10-CM | POA: Diagnosis not present

## 2015-11-24 DIAGNOSIS — Z992 Dependence on renal dialysis: Secondary | ICD-10-CM | POA: Diagnosis not present

## 2015-11-24 DIAGNOSIS — N186 End stage renal disease: Secondary | ICD-10-CM | POA: Diagnosis not present

## 2015-11-26 DIAGNOSIS — Z992 Dependence on renal dialysis: Secondary | ICD-10-CM | POA: Diagnosis not present

## 2015-11-26 DIAGNOSIS — N186 End stage renal disease: Secondary | ICD-10-CM | POA: Diagnosis not present

## 2015-11-29 DIAGNOSIS — N186 End stage renal disease: Secondary | ICD-10-CM | POA: Diagnosis not present

## 2015-11-29 DIAGNOSIS — Z992 Dependence on renal dialysis: Secondary | ICD-10-CM | POA: Diagnosis not present

## 2015-12-01 DIAGNOSIS — Z992 Dependence on renal dialysis: Secondary | ICD-10-CM | POA: Diagnosis not present

## 2015-12-01 DIAGNOSIS — N186 End stage renal disease: Secondary | ICD-10-CM | POA: Diagnosis not present

## 2015-12-03 DIAGNOSIS — Z992 Dependence on renal dialysis: Secondary | ICD-10-CM | POA: Diagnosis not present

## 2015-12-03 DIAGNOSIS — N186 End stage renal disease: Secondary | ICD-10-CM | POA: Diagnosis not present

## 2015-12-05 DIAGNOSIS — Z992 Dependence on renal dialysis: Secondary | ICD-10-CM | POA: Diagnosis not present

## 2015-12-05 DIAGNOSIS — N186 End stage renal disease: Secondary | ICD-10-CM | POA: Diagnosis not present

## 2015-12-06 DIAGNOSIS — N186 End stage renal disease: Secondary | ICD-10-CM | POA: Diagnosis not present

## 2015-12-06 DIAGNOSIS — Z992 Dependence on renal dialysis: Secondary | ICD-10-CM | POA: Diagnosis not present

## 2015-12-08 DIAGNOSIS — Z992 Dependence on renal dialysis: Secondary | ICD-10-CM | POA: Diagnosis not present

## 2015-12-08 DIAGNOSIS — N186 End stage renal disease: Secondary | ICD-10-CM | POA: Diagnosis not present

## 2015-12-10 DIAGNOSIS — Z992 Dependence on renal dialysis: Secondary | ICD-10-CM | POA: Diagnosis not present

## 2015-12-10 DIAGNOSIS — N186 End stage renal disease: Secondary | ICD-10-CM | POA: Diagnosis not present

## 2015-12-13 DIAGNOSIS — N186 End stage renal disease: Secondary | ICD-10-CM | POA: Diagnosis not present

## 2015-12-13 DIAGNOSIS — Z992 Dependence on renal dialysis: Secondary | ICD-10-CM | POA: Diagnosis not present

## 2015-12-15 DIAGNOSIS — N186 End stage renal disease: Secondary | ICD-10-CM | POA: Diagnosis not present

## 2015-12-15 DIAGNOSIS — Z992 Dependence on renal dialysis: Secondary | ICD-10-CM | POA: Diagnosis not present

## 2015-12-17 DIAGNOSIS — Z992 Dependence on renal dialysis: Secondary | ICD-10-CM | POA: Diagnosis not present

## 2015-12-17 DIAGNOSIS — N186 End stage renal disease: Secondary | ICD-10-CM | POA: Diagnosis not present

## 2015-12-20 DIAGNOSIS — N186 End stage renal disease: Secondary | ICD-10-CM | POA: Diagnosis not present

## 2015-12-20 DIAGNOSIS — Z992 Dependence on renal dialysis: Secondary | ICD-10-CM | POA: Diagnosis not present

## 2015-12-22 DIAGNOSIS — N186 End stage renal disease: Secondary | ICD-10-CM | POA: Diagnosis not present

## 2015-12-22 DIAGNOSIS — Z992 Dependence on renal dialysis: Secondary | ICD-10-CM | POA: Diagnosis not present

## 2015-12-24 DIAGNOSIS — N186 End stage renal disease: Secondary | ICD-10-CM | POA: Diagnosis not present

## 2015-12-24 DIAGNOSIS — Z992 Dependence on renal dialysis: Secondary | ICD-10-CM | POA: Diagnosis not present

## 2015-12-27 DIAGNOSIS — Z992 Dependence on renal dialysis: Secondary | ICD-10-CM | POA: Diagnosis not present

## 2015-12-27 DIAGNOSIS — N186 End stage renal disease: Secondary | ICD-10-CM | POA: Diagnosis not present

## 2015-12-29 DIAGNOSIS — N186 End stage renal disease: Secondary | ICD-10-CM | POA: Diagnosis not present

## 2015-12-29 DIAGNOSIS — Z992 Dependence on renal dialysis: Secondary | ICD-10-CM | POA: Diagnosis not present

## 2015-12-31 DIAGNOSIS — N186 End stage renal disease: Secondary | ICD-10-CM | POA: Diagnosis not present

## 2015-12-31 DIAGNOSIS — Z992 Dependence on renal dialysis: Secondary | ICD-10-CM | POA: Diagnosis not present

## 2016-01-03 DIAGNOSIS — Z992 Dependence on renal dialysis: Secondary | ICD-10-CM | POA: Diagnosis not present

## 2016-01-03 DIAGNOSIS — N186 End stage renal disease: Secondary | ICD-10-CM | POA: Diagnosis not present

## 2016-01-05 DIAGNOSIS — N186 End stage renal disease: Secondary | ICD-10-CM | POA: Diagnosis not present

## 2016-01-05 DIAGNOSIS — Z992 Dependence on renal dialysis: Secondary | ICD-10-CM | POA: Diagnosis not present

## 2016-01-07 DIAGNOSIS — N186 End stage renal disease: Secondary | ICD-10-CM | POA: Diagnosis not present

## 2016-01-07 DIAGNOSIS — Z992 Dependence on renal dialysis: Secondary | ICD-10-CM | POA: Diagnosis not present

## 2016-01-10 DIAGNOSIS — Z992 Dependence on renal dialysis: Secondary | ICD-10-CM | POA: Diagnosis not present

## 2016-01-10 DIAGNOSIS — N186 End stage renal disease: Secondary | ICD-10-CM | POA: Diagnosis not present

## 2016-01-12 DIAGNOSIS — N186 End stage renal disease: Secondary | ICD-10-CM | POA: Diagnosis not present

## 2016-01-12 DIAGNOSIS — Z992 Dependence on renal dialysis: Secondary | ICD-10-CM | POA: Diagnosis not present

## 2016-01-14 DIAGNOSIS — N186 End stage renal disease: Secondary | ICD-10-CM | POA: Diagnosis not present

## 2016-01-14 DIAGNOSIS — Z992 Dependence on renal dialysis: Secondary | ICD-10-CM | POA: Diagnosis not present

## 2016-01-17 DIAGNOSIS — N186 End stage renal disease: Secondary | ICD-10-CM | POA: Diagnosis not present

## 2016-01-17 DIAGNOSIS — Z992 Dependence on renal dialysis: Secondary | ICD-10-CM | POA: Diagnosis not present

## 2016-01-19 DIAGNOSIS — N186 End stage renal disease: Secondary | ICD-10-CM | POA: Diagnosis not present

## 2016-01-19 DIAGNOSIS — Z992 Dependence on renal dialysis: Secondary | ICD-10-CM | POA: Diagnosis not present

## 2016-01-21 DIAGNOSIS — Z992 Dependence on renal dialysis: Secondary | ICD-10-CM | POA: Diagnosis not present

## 2016-01-21 DIAGNOSIS — N186 End stage renal disease: Secondary | ICD-10-CM | POA: Diagnosis not present

## 2016-01-24 DIAGNOSIS — N186 End stage renal disease: Secondary | ICD-10-CM | POA: Diagnosis not present

## 2016-01-24 DIAGNOSIS — Z992 Dependence on renal dialysis: Secondary | ICD-10-CM | POA: Diagnosis not present

## 2016-01-26 DIAGNOSIS — N186 End stage renal disease: Secondary | ICD-10-CM | POA: Diagnosis not present

## 2016-01-26 DIAGNOSIS — Z992 Dependence on renal dialysis: Secondary | ICD-10-CM | POA: Diagnosis not present

## 2016-01-28 DIAGNOSIS — N186 End stage renal disease: Secondary | ICD-10-CM | POA: Diagnosis not present

## 2016-01-28 DIAGNOSIS — Z992 Dependence on renal dialysis: Secondary | ICD-10-CM | POA: Diagnosis not present

## 2016-01-31 DIAGNOSIS — Z992 Dependence on renal dialysis: Secondary | ICD-10-CM | POA: Diagnosis not present

## 2016-01-31 DIAGNOSIS — N186 End stage renal disease: Secondary | ICD-10-CM | POA: Diagnosis not present

## 2016-02-02 DIAGNOSIS — N186 End stage renal disease: Secondary | ICD-10-CM | POA: Diagnosis not present

## 2016-02-02 DIAGNOSIS — Z992 Dependence on renal dialysis: Secondary | ICD-10-CM | POA: Diagnosis not present

## 2016-02-04 DIAGNOSIS — N186 End stage renal disease: Secondary | ICD-10-CM | POA: Diagnosis not present

## 2016-02-04 DIAGNOSIS — Z992 Dependence on renal dialysis: Secondary | ICD-10-CM | POA: Diagnosis not present

## 2016-02-07 DIAGNOSIS — N186 End stage renal disease: Secondary | ICD-10-CM | POA: Diagnosis not present

## 2016-02-07 DIAGNOSIS — Z992 Dependence on renal dialysis: Secondary | ICD-10-CM | POA: Diagnosis not present

## 2016-02-09 DIAGNOSIS — Z992 Dependence on renal dialysis: Secondary | ICD-10-CM | POA: Diagnosis not present

## 2016-02-09 DIAGNOSIS — N186 End stage renal disease: Secondary | ICD-10-CM | POA: Diagnosis not present

## 2016-02-11 DIAGNOSIS — Z992 Dependence on renal dialysis: Secondary | ICD-10-CM | POA: Diagnosis not present

## 2016-02-11 DIAGNOSIS — N186 End stage renal disease: Secondary | ICD-10-CM | POA: Diagnosis not present

## 2016-02-14 DIAGNOSIS — N186 End stage renal disease: Secondary | ICD-10-CM | POA: Diagnosis not present

## 2016-02-14 DIAGNOSIS — Z992 Dependence on renal dialysis: Secondary | ICD-10-CM | POA: Diagnosis not present

## 2016-02-16 DIAGNOSIS — Z992 Dependence on renal dialysis: Secondary | ICD-10-CM | POA: Diagnosis not present

## 2016-02-16 DIAGNOSIS — N186 End stage renal disease: Secondary | ICD-10-CM | POA: Diagnosis not present

## 2016-02-18 DIAGNOSIS — N186 End stage renal disease: Secondary | ICD-10-CM | POA: Diagnosis not present

## 2016-02-18 DIAGNOSIS — Z992 Dependence on renal dialysis: Secondary | ICD-10-CM | POA: Diagnosis not present

## 2016-02-21 DIAGNOSIS — N186 End stage renal disease: Secondary | ICD-10-CM | POA: Diagnosis not present

## 2016-02-21 DIAGNOSIS — Z992 Dependence on renal dialysis: Secondary | ICD-10-CM | POA: Diagnosis not present

## 2016-02-23 DIAGNOSIS — N186 End stage renal disease: Secondary | ICD-10-CM | POA: Diagnosis not present

## 2016-02-23 DIAGNOSIS — Z992 Dependence on renal dialysis: Secondary | ICD-10-CM | POA: Diagnosis not present

## 2016-02-25 DIAGNOSIS — Z992 Dependence on renal dialysis: Secondary | ICD-10-CM | POA: Diagnosis not present

## 2016-02-25 DIAGNOSIS — N186 End stage renal disease: Secondary | ICD-10-CM | POA: Diagnosis not present

## 2016-02-28 DIAGNOSIS — Z992 Dependence on renal dialysis: Secondary | ICD-10-CM | POA: Diagnosis not present

## 2016-02-28 DIAGNOSIS — N186 End stage renal disease: Secondary | ICD-10-CM | POA: Diagnosis not present

## 2016-03-01 DIAGNOSIS — Z992 Dependence on renal dialysis: Secondary | ICD-10-CM | POA: Diagnosis not present

## 2016-03-01 DIAGNOSIS — N186 End stage renal disease: Secondary | ICD-10-CM | POA: Diagnosis not present

## 2016-03-03 DIAGNOSIS — Z992 Dependence on renal dialysis: Secondary | ICD-10-CM | POA: Diagnosis not present

## 2016-03-03 DIAGNOSIS — N186 End stage renal disease: Secondary | ICD-10-CM | POA: Diagnosis not present

## 2016-03-06 DIAGNOSIS — Z992 Dependence on renal dialysis: Secondary | ICD-10-CM | POA: Diagnosis not present

## 2016-03-06 DIAGNOSIS — N186 End stage renal disease: Secondary | ICD-10-CM | POA: Diagnosis not present

## 2016-03-08 DIAGNOSIS — Z992 Dependence on renal dialysis: Secondary | ICD-10-CM | POA: Diagnosis not present

## 2016-03-08 DIAGNOSIS — N186 End stage renal disease: Secondary | ICD-10-CM | POA: Diagnosis not present

## 2016-03-10 DIAGNOSIS — Z992 Dependence on renal dialysis: Secondary | ICD-10-CM | POA: Diagnosis not present

## 2016-03-10 DIAGNOSIS — N186 End stage renal disease: Secondary | ICD-10-CM | POA: Diagnosis not present

## 2016-03-13 DIAGNOSIS — N186 End stage renal disease: Secondary | ICD-10-CM | POA: Diagnosis not present

## 2016-03-13 DIAGNOSIS — Z992 Dependence on renal dialysis: Secondary | ICD-10-CM | POA: Diagnosis not present

## 2016-03-15 DIAGNOSIS — Z992 Dependence on renal dialysis: Secondary | ICD-10-CM | POA: Diagnosis not present

## 2016-03-15 DIAGNOSIS — N186 End stage renal disease: Secondary | ICD-10-CM | POA: Diagnosis not present

## 2016-03-17 DIAGNOSIS — N186 End stage renal disease: Secondary | ICD-10-CM | POA: Diagnosis not present

## 2016-03-17 DIAGNOSIS — Z992 Dependence on renal dialysis: Secondary | ICD-10-CM | POA: Diagnosis not present

## 2016-03-20 DIAGNOSIS — N186 End stage renal disease: Secondary | ICD-10-CM | POA: Diagnosis not present

## 2016-03-20 DIAGNOSIS — Z992 Dependence on renal dialysis: Secondary | ICD-10-CM | POA: Diagnosis not present

## 2016-03-22 DIAGNOSIS — N186 End stage renal disease: Secondary | ICD-10-CM | POA: Diagnosis not present

## 2016-03-22 DIAGNOSIS — Z992 Dependence on renal dialysis: Secondary | ICD-10-CM | POA: Diagnosis not present

## 2016-03-24 DIAGNOSIS — Z992 Dependence on renal dialysis: Secondary | ICD-10-CM | POA: Diagnosis not present

## 2016-03-24 DIAGNOSIS — N186 End stage renal disease: Secondary | ICD-10-CM | POA: Diagnosis not present

## 2016-03-27 DIAGNOSIS — Z992 Dependence on renal dialysis: Secondary | ICD-10-CM | POA: Diagnosis not present

## 2016-03-27 DIAGNOSIS — N186 End stage renal disease: Secondary | ICD-10-CM | POA: Diagnosis not present

## 2016-03-29 DIAGNOSIS — N186 End stage renal disease: Secondary | ICD-10-CM | POA: Diagnosis not present

## 2016-03-29 DIAGNOSIS — Z992 Dependence on renal dialysis: Secondary | ICD-10-CM | POA: Diagnosis not present

## 2016-03-31 DIAGNOSIS — Z992 Dependence on renal dialysis: Secondary | ICD-10-CM | POA: Diagnosis not present

## 2016-03-31 DIAGNOSIS — N186 End stage renal disease: Secondary | ICD-10-CM | POA: Diagnosis not present

## 2016-04-01 ENCOUNTER — Encounter (HOSPITAL_COMMUNITY): Payer: Self-pay | Admitting: *Deleted

## 2016-04-01 ENCOUNTER — Emergency Department (HOSPITAL_COMMUNITY)
Admission: EM | Admit: 2016-04-01 | Discharge: 2016-04-01 | Disposition: A | Discharge status: Home / Self Care | Payer: Medicare HMO | Attending: Emergency Medicine | Admitting: Emergency Medicine

## 2016-04-01 ENCOUNTER — Emergency Department (HOSPITAL_COMMUNITY): Payer: Medicare HMO

## 2016-04-01 DIAGNOSIS — Z7901 Long term (current) use of anticoagulants: Secondary | ICD-10-CM | POA: Insufficient documentation

## 2016-04-01 DIAGNOSIS — Z87891 Personal history of nicotine dependence: Secondary | ICD-10-CM | POA: Insufficient documentation

## 2016-04-01 DIAGNOSIS — I4891 Unspecified atrial fibrillation: Secondary | ICD-10-CM | POA: Diagnosis not present

## 2016-04-01 DIAGNOSIS — Z992 Dependence on renal dialysis: Secondary | ICD-10-CM | POA: Diagnosis not present

## 2016-04-01 DIAGNOSIS — I132 Hypertensive heart and chronic kidney disease with heart failure and with stage 5 chronic kidney disease, or end stage renal disease: Secondary | ICD-10-CM | POA: Diagnosis not present

## 2016-04-01 DIAGNOSIS — Z79899 Other long term (current) drug therapy: Secondary | ICD-10-CM | POA: Insufficient documentation

## 2016-04-01 DIAGNOSIS — N186 End stage renal disease: Secondary | ICD-10-CM | POA: Insufficient documentation

## 2016-04-01 DIAGNOSIS — Z8541 Personal history of malignant neoplasm of cervix uteri: Secondary | ICD-10-CM | POA: Diagnosis not present

## 2016-04-01 DIAGNOSIS — M7731 Calcaneal spur, right foot: Secondary | ICD-10-CM | POA: Diagnosis not present

## 2016-04-01 DIAGNOSIS — M79671 Pain in right foot: Secondary | ICD-10-CM

## 2016-04-01 DIAGNOSIS — I5032 Chronic diastolic (congestive) heart failure: Secondary | ICD-10-CM | POA: Insufficient documentation

## 2016-04-01 DIAGNOSIS — M25571 Pain in right ankle and joints of right foot: Secondary | ICD-10-CM | POA: Diagnosis not present

## 2016-04-01 HISTORY — DX: Polyneuropathy, unspecified: G62.9

## 2016-04-01 MED ORDER — METHOCARBAMOL 500 MG PO TABS: 500.0000 mg | ORAL_TABLET | Freq: Two times a day (BID) | ORAL | 0 refills | Status: DC | PRN

## 2016-04-01 MED ORDER — METHOCARBAMOL 500 MG PO TABS
500.0000 mg | ORAL_TABLET | Freq: Once | ORAL | Status: AC
Start: 2016-04-01 — End: 2016-04-01
  Administered 2016-04-01: 500 mg via ORAL
  Filled 2016-04-01: qty 1

## 2016-04-01 NOTE — ED Triage Notes (Signed)
Pt reports right intermittent foot pain that started this morning. Pt states when the pain comes, it feels like someone is stabbing the top of her right foot.

## 2016-04-01 NOTE — ED Notes (Signed)
Pt to Radiology

## 2016-04-01 NOTE — ED Provider Notes (Signed)
[facility] DEPT Provider Note   CSN: [id number] Arrival date & time: 04/01/16  1955     History   Chief Complaint Chief Complaint  Patient presents with  . Foot Pain    HPI Sabrina Beck is a 75 y.o. female.  HPI  Pt was seen at 2115. Per pt, c/o gradual onset and persistence of intermittent right foot "pain" since this morning. Pt describes the pain as "sharp" and "spasm," lasting a few seconds at a time. Pain is located at the dorsal lateral aspect of her right foot. Denies injury, no back pain, no focal motor weakness, no tingling/numbness in extremities, no fevers, no rash.    Past Medical History:  Diagnosis Date  . Anemia 03/27/2011  . Atrial fibrillation ([place])   . Cancer ([place])    uterine  . Chest pain 05/15/2006   stress test perform negative for ischemia  . CHF (congestive heart failure) ([place])   . Complication of anesthesia   . Diverticulosis   . Dysrhythmia    afib  . Family history of anesthesia complication    "brother has nausea when put to sleep."  . GERD (gastroesophageal reflux disease)   . Headache   . Kidney disease    stage 4 chronic  . Mixed hyperlipidemia   . Murmur 06/04/2006,09/29/10   2D Echo perform both test shows EF 55%  . Peripheral neuropathy ([place])   . PONV (postoperative nausea and vomiting)   . Renal disorder   . Shortness of breath    with exertion  . Systemic hypertension     Patient Active Problem List   Diagnosis Date Noted  . ESRD (end stage renal disease) ([place]) 05/19/2014  . Complications due to renal dialysis device, implant, and graft ([place]) 05/14/2014  . Abscess re-check 01/19/2014  . Breast abscess of female 01/19/2014  . Chronic diastolic CHF (congestive heart failure) ([place]) 12/25/2013  . Dyspnea 12/21/2013  . CHF (congestive heart failure) ([place]) 12/21/2013  . End stage renal disease ([place]) 11/06/2013  . CKD (chronic kidney disease) stage 5, GFR less than 15 ml/min (HCC) 10/21/2013  . HTN (hypertension)  10/21/2013  . Mixed hyperlipidemia 10/21/2013  . Encounter for therapeutic drug monitoring 09/03/2013  . Atrial fibrillation ([place]) 10/11/2012  . Long term (current) use of anticoagulants 10/11/2012  . Anemia 03/27/2011    Past Surgical History:  Procedure Laterality Date  . ABDOMINAL HYSTERECTOMY    . APPENDECTOMY    . AV FISTULA PLACEMENT Left 11/17/2013   Procedure: CREATION OF LEFT RADIOCEPHALIC ARTERIOVENOUS (AV) FISTULA ;  Surgeon: [name], MD;  Location: [facility];  Service: Vascular;  Laterality: Left;  . BREAST SURGERY Left    tumors non canerous 2- 3 different surgeries  . CHOLECYSTECTOMY    . COLONOSCOPY W/ BIOPSIES AND POLYPECTOMY    . FISTULA SUPERFICIALIZATION Left 05/19/2014   Procedure: FISTULA SUPERFICIALIZATION-LEFT ARM;  Surgeon: [name], MD;  Location: [facility];  Service: Vascular;  Laterality: Left;  . INSERTION OF DIALYSIS CATHETER N/A 01/24/2014   Procedure: INSERTION OF DIALYSIS CATHETER;  Surgeon: Rosetta Posner, MD;  Location: [facility] OR;  Service: Vascular;  Laterality: N/A;  . KNEE ARTHROSCOPY    . TUBAL LIGATION         Home Medications    Prior to Admission medications   Medication Sig Start Date End Date Taking? Authorizing Provider  acetaminophen (TYLENOL) 500 MG tablet Take 1,000 mg by mouth every 6 (six) hours as needed for moderate pain.  Historical Provider, MD  calcium acetate (PHOSLO) 667 MG capsule Take 2 capsules by mouth 3 (three) times daily. 05/15/15   Historical Provider, MD  Cholecalciferol (VITAMIN D) 2000 UNITS CAPS Take 1 capsule by mouth daily.    Historical Provider, MD  metoprolol (LOPRESSOR) 50 MG tablet TAKE ONE TABLET BY MOUTH TWICE A DAY 08/24/15   Mihai Croitoru, MD  multivitamin (RENA-VIT) TABS tablet Take 1 tablet by mouth daily.    Historical Provider, MD  Omega-3 Fatty Acids (FISH OIL) 1200 MG CAPS Take 1,200 mg by mouth 2 (two) times daily.     Historical Provider, MD  omeprazole (PRILOSEC) 20 MG capsule Take 20 mg  by mouth daily.    Historical Provider, MD  Propylene Glycol (SYSTANE BALANCE) 0.6 % SOLN Place 1 drop into both eyes as needed (for burning or dry eyes).    Historical Provider, MD  RENVELA 800 MG tablet Take 1,600 mg by mouth 3 (three) times daily. 04/17/15   Historical Provider, MD  warfarin (COUMADIN) 5 MG tablet TAKE ONE TABLET BY MOUTH DAILY OR AS DIRECTED BY COUMADIN CLINIC 11/16/14   Rockne Menghini, RPH-CPP    Family History Family History  Problem Relation Age of Onset  . Diabetes Mother   . Hypertension Mother   . Diabetes Father   . Heart disease Father     before age 64  . Heart attack Father   . Diabetes Sister   . Hyperlipidemia Sister   . Hypertension Sister   . Cancer Brother   . Diabetes Brother   . Heart disease Brother   . Hypertension Brother   . Heart attack Brother     Social History Social History  Substance Use Topics  . Smoking status: Former Smoker    Quit date: 08/08/1999  . Smokeless tobacco: Never Used  . Alcohol use No     Allergies   Review of patient's allergies indicates no known allergies.   Review of Systems Review of Systems ROS: Statement: All systems negative except as marked or noted in the HPI; Constitutional: Negative for fever and chills. ; ; Eyes: Negative for eye pain, redness and discharge. ; ; ENMT: Negative for ear pain, hoarseness, nasal congestion, sinus pressure and sore throat. ; ; Cardiovascular: Negative for chest pain, palpitations, diaphoresis, dyspnea and peripheral edema. ; ; Respiratory: Negative for cough, wheezing and stridor. ; ; Gastrointestinal: Negative for nausea, vomiting, diarrhea, abdominal pain, blood in stool, hematemesis, jaundice and rectal bleeding. . ; ; Genitourinary: Negative for dysuria, flank pain and hematuria. ; ; Musculoskeletal: +right foot pain. Negative for back pain and neck pain. Negative for swelling and trauma.; ; Skin: Negative for pruritus, rash, abrasions, blisters, bruising and skin  lesion.; ; Neuro: Negative for headache, lightheadedness and neck stiffness. Negative for weakness, altered level of consciousness, altered mental status, extremity weakness, paresthesias, involuntary movement, seizure and syncope.       Physical Exam Updated Vital Signs BP (!) 102/52 (BP Location: Left Arm)   Pulse 110   Temp 98.1 F (36.7 C) (Oral)   Resp 15   Ht 5\' 4"  (1.626 m)   Wt 225 lb (102.1 kg)   SpO2 95%   BMI 38.62 kg/m   Physical Exam 2120: Physical examination:  Nursing notes reviewed; Vital signs and O2 SAT reviewed;  Constitutional: Well developed, Well nourished, Well hydrated, In no acute distress; Head:  Normocephalic, atraumatic; Eyes: EOMI, PERRL, No scleral icterus; ENMT: Mouth and pharynx normal, Mucous membranes moist; Neck:  Supple, Full range of motion, No lymphadenopathy; Cardiovascular: Regular rate and rhythm, No gallop; Respiratory: Breath sounds clear & equal bilaterally, No wheezes.  Speaking full sentences with ease, Normal respiratory effort/excursion; Chest: Nontender, Movement normal; Abdomen: Soft, Nontender, Nondistended, Normal bowel sounds;; Extremities: Pulses normal, No tenderness right knee/ankle/foot/toes. NT right calf. No rash, no ecchymosis, no deformity, no edema, No calf edema or asymmetry. NMS intact right foot, strong pedal pp, LE muscle compartments soft.  No right proximal fibular head tenderness.  +plantarflexion of right foot w/calf squeeze.  No palpable gap right Achilles's tendon. FROM right ankle without pain.; Neuro: AA&Ox3, Major CN grossly intact.  Speech clear. No gross focal motor or sensory deficits in extremities.; Skin: Color normal, Warm, Dry.   ED Treatments / Results  Labs (all labs ordered are listed, but only abnormal results are displayed)   EKG  EKG Interpretation None       Radiology   Procedures Procedures (including critical care time)  Medications Ordered in ED Medications - No data to  display   Initial Impression / Assessment and Plan / ED Course  I have reviewed the triage vital signs and the nursing notes.  Pertinent labs & imaging results that were available during my care of the patient were reviewed by me and considered in my medical decision making (see chart for details).   MDM Reviewed: previous chart, nursing note and vitals Interpretation: x-ray   Dg Foot Complete Right Result Date: 04/01/2016 CLINICAL DATA:  Intermittent right foot pain, onset today. No known injury. EXAM: RIGHT FOOT COMPLETE - 3+ VIEW COMPARISON:  None. FINDINGS: There is no evidence of fracture or dislocation. Minimal hammer deformity of the digits. There is no evidence of arthropathy or other focal bone abnormality. Plantar calcaneal spur and small Achilles tendon enthesophyte. There are vascular calcifications. No localizing focal soft tissue abnormality. IMPRESSION: No acute abnormality. Minimal hammertoe deformity of the toes and plantar calcaneal spur. Electronically Signed   By: Jeb Levering M.D.   On: 04/01/2016 21:02    2130:  XR reassuring. Exam benign;  NT calf, strong pedal pulse. Doubt DVT or arterial occlusion as source for pain.  Tx symptomatically at this time. Dx and testing d/w pt and family.  Questions answered.  Verb understanding, agreeable to d/c home with outpt f/u.     Final Clinical Impressions(s) / ED Diagnoses   Final diagnoses:  None    New Prescriptions New Prescriptions   No medications on file     Francine Graven, DO 04/06/16 1340

## 2016-04-01 NOTE — Discharge Instructions (Signed)
Take the prescription as directed.  Apply moist heat or ice to the area(s) of discomfort, for 15 minutes at a time, several times per day for the next few days.  Do not fall asleep on a heating or ice pack.  Call your regular medical doctor on Monday to schedule a follow up appointment this week.  Return to the Emergency Department immediately if worsening. ° °

## 2016-04-03 DIAGNOSIS — Z992 Dependence on renal dialysis: Secondary | ICD-10-CM | POA: Diagnosis not present

## 2016-04-03 DIAGNOSIS — N186 End stage renal disease: Secondary | ICD-10-CM | POA: Diagnosis not present

## 2016-04-05 DIAGNOSIS — Z992 Dependence on renal dialysis: Secondary | ICD-10-CM | POA: Diagnosis not present

## 2016-04-05 DIAGNOSIS — N186 End stage renal disease: Secondary | ICD-10-CM | POA: Diagnosis not present

## 2016-04-06 DIAGNOSIS — Z992 Dependence on renal dialysis: Secondary | ICD-10-CM | POA: Diagnosis not present

## 2016-04-06 DIAGNOSIS — N186 End stage renal disease: Secondary | ICD-10-CM | POA: Diagnosis not present

## 2016-04-07 DIAGNOSIS — N186 End stage renal disease: Secondary | ICD-10-CM | POA: Diagnosis not present

## 2016-04-07 DIAGNOSIS — Z992 Dependence on renal dialysis: Secondary | ICD-10-CM | POA: Diagnosis not present

## 2016-04-10 DIAGNOSIS — N186 End stage renal disease: Secondary | ICD-10-CM | POA: Diagnosis not present

## 2016-04-10 DIAGNOSIS — Z992 Dependence on renal dialysis: Secondary | ICD-10-CM | POA: Diagnosis not present

## 2016-04-12 DIAGNOSIS — Z992 Dependence on renal dialysis: Secondary | ICD-10-CM | POA: Diagnosis not present

## 2016-04-12 DIAGNOSIS — N186 End stage renal disease: Secondary | ICD-10-CM | POA: Diagnosis not present

## 2016-04-14 DIAGNOSIS — N186 End stage renal disease: Secondary | ICD-10-CM | POA: Diagnosis not present

## 2016-04-14 DIAGNOSIS — Z992 Dependence on renal dialysis: Secondary | ICD-10-CM | POA: Diagnosis not present

## 2016-04-16 DIAGNOSIS — Z992 Dependence on renal dialysis: Secondary | ICD-10-CM | POA: Diagnosis not present

## 2016-04-16 DIAGNOSIS — N186 End stage renal disease: Secondary | ICD-10-CM | POA: Diagnosis not present

## 2016-04-19 DIAGNOSIS — N186 End stage renal disease: Secondary | ICD-10-CM | POA: Diagnosis not present

## 2016-04-19 DIAGNOSIS — Z992 Dependence on renal dialysis: Secondary | ICD-10-CM | POA: Diagnosis not present

## 2016-04-21 DIAGNOSIS — Z992 Dependence on renal dialysis: Secondary | ICD-10-CM | POA: Diagnosis not present

## 2016-04-21 DIAGNOSIS — N186 End stage renal disease: Secondary | ICD-10-CM | POA: Diagnosis not present

## 2016-04-24 DIAGNOSIS — Z992 Dependence on renal dialysis: Secondary | ICD-10-CM | POA: Diagnosis not present

## 2016-04-24 DIAGNOSIS — N186 End stage renal disease: Secondary | ICD-10-CM | POA: Diagnosis not present

## 2016-04-25 DIAGNOSIS — N189 Chronic kidney disease, unspecified: Secondary | ICD-10-CM | POA: Diagnosis not present

## 2016-04-25 DIAGNOSIS — Z992 Dependence on renal dialysis: Secondary | ICD-10-CM | POA: Diagnosis not present

## 2016-04-25 DIAGNOSIS — T82898A Other specified complication of vascular prosthetic devices, implants and grafts, initial encounter: Secondary | ICD-10-CM | POA: Diagnosis not present

## 2016-04-25 DIAGNOSIS — I4891 Unspecified atrial fibrillation: Secondary | ICD-10-CM | POA: Diagnosis not present

## 2016-04-25 DIAGNOSIS — E785 Hyperlipidemia, unspecified: Secondary | ICD-10-CM | POA: Diagnosis not present

## 2016-04-25 DIAGNOSIS — Z1389 Encounter for screening for other disorder: Secondary | ICD-10-CM | POA: Diagnosis not present

## 2016-04-25 DIAGNOSIS — I2510 Atherosclerotic heart disease of native coronary artery without angina pectoris: Secondary | ICD-10-CM | POA: Diagnosis not present

## 2016-04-25 DIAGNOSIS — Z0001 Encounter for general adult medical examination with abnormal findings: Secondary | ICD-10-CM | POA: Diagnosis not present

## 2016-04-25 DIAGNOSIS — I509 Heart failure, unspecified: Secondary | ICD-10-CM | POA: Diagnosis not present

## 2016-04-25 DIAGNOSIS — I10 Essential (primary) hypertension: Secondary | ICD-10-CM | POA: Diagnosis not present

## 2016-04-25 DIAGNOSIS — Z6838 Body mass index (BMI) 38.0-38.9, adult: Secondary | ICD-10-CM | POA: Diagnosis not present

## 2016-04-25 DIAGNOSIS — R7309 Other abnormal glucose: Secondary | ICD-10-CM | POA: Diagnosis not present

## 2016-04-26 DIAGNOSIS — Z992 Dependence on renal dialysis: Secondary | ICD-10-CM | POA: Diagnosis not present

## 2016-04-26 DIAGNOSIS — N186 End stage renal disease: Secondary | ICD-10-CM | POA: Diagnosis not present

## 2016-04-28 DIAGNOSIS — N186 End stage renal disease: Secondary | ICD-10-CM | POA: Diagnosis not present

## 2016-04-28 DIAGNOSIS — Z992 Dependence on renal dialysis: Secondary | ICD-10-CM | POA: Diagnosis not present

## 2016-05-01 DIAGNOSIS — Z992 Dependence on renal dialysis: Secondary | ICD-10-CM | POA: Diagnosis not present

## 2016-05-01 DIAGNOSIS — N186 End stage renal disease: Secondary | ICD-10-CM | POA: Diagnosis not present

## 2016-05-03 DIAGNOSIS — Z992 Dependence on renal dialysis: Secondary | ICD-10-CM | POA: Diagnosis not present

## 2016-05-03 DIAGNOSIS — N186 End stage renal disease: Secondary | ICD-10-CM | POA: Diagnosis not present

## 2016-05-05 DIAGNOSIS — N186 End stage renal disease: Secondary | ICD-10-CM | POA: Diagnosis not present

## 2016-05-05 DIAGNOSIS — Z992 Dependence on renal dialysis: Secondary | ICD-10-CM | POA: Diagnosis not present

## 2016-05-06 DIAGNOSIS — Z992 Dependence on renal dialysis: Secondary | ICD-10-CM | POA: Diagnosis not present

## 2016-05-06 DIAGNOSIS — N186 End stage renal disease: Secondary | ICD-10-CM | POA: Diagnosis not present

## 2016-05-08 DIAGNOSIS — Z992 Dependence on renal dialysis: Secondary | ICD-10-CM | POA: Diagnosis not present

## 2016-05-08 DIAGNOSIS — N186 End stage renal disease: Secondary | ICD-10-CM | POA: Diagnosis not present

## 2016-05-10 DIAGNOSIS — N186 End stage renal disease: Secondary | ICD-10-CM | POA: Diagnosis not present

## 2016-05-10 DIAGNOSIS — Z992 Dependence on renal dialysis: Secondary | ICD-10-CM | POA: Diagnosis not present

## 2016-05-12 DIAGNOSIS — N186 End stage renal disease: Secondary | ICD-10-CM | POA: Diagnosis not present

## 2016-05-12 DIAGNOSIS — Z992 Dependence on renal dialysis: Secondary | ICD-10-CM | POA: Diagnosis not present

## 2016-05-15 DIAGNOSIS — Z992 Dependence on renal dialysis: Secondary | ICD-10-CM | POA: Diagnosis not present

## 2016-05-15 DIAGNOSIS — N186 End stage renal disease: Secondary | ICD-10-CM | POA: Diagnosis not present

## 2016-05-17 DIAGNOSIS — N186 End stage renal disease: Secondary | ICD-10-CM | POA: Diagnosis not present

## 2016-05-17 DIAGNOSIS — Z992 Dependence on renal dialysis: Secondary | ICD-10-CM | POA: Diagnosis not present

## 2016-05-19 DIAGNOSIS — Z992 Dependence on renal dialysis: Secondary | ICD-10-CM | POA: Diagnosis not present

## 2016-05-19 DIAGNOSIS — N186 End stage renal disease: Secondary | ICD-10-CM | POA: Diagnosis not present

## 2016-05-22 DIAGNOSIS — Z992 Dependence on renal dialysis: Secondary | ICD-10-CM | POA: Diagnosis not present

## 2016-05-22 DIAGNOSIS — N186 End stage renal disease: Secondary | ICD-10-CM | POA: Diagnosis not present

## 2016-05-24 DIAGNOSIS — N186 End stage renal disease: Secondary | ICD-10-CM | POA: Diagnosis not present

## 2016-05-24 DIAGNOSIS — Z992 Dependence on renal dialysis: Secondary | ICD-10-CM | POA: Diagnosis not present

## 2016-05-26 DIAGNOSIS — N186 End stage renal disease: Secondary | ICD-10-CM | POA: Diagnosis not present

## 2016-05-26 DIAGNOSIS — Z992 Dependence on renal dialysis: Secondary | ICD-10-CM | POA: Diagnosis not present

## 2016-05-29 DIAGNOSIS — Z992 Dependence on renal dialysis: Secondary | ICD-10-CM | POA: Diagnosis not present

## 2016-05-29 DIAGNOSIS — N186 End stage renal disease: Secondary | ICD-10-CM | POA: Diagnosis not present

## 2016-05-31 DIAGNOSIS — Z992 Dependence on renal dialysis: Secondary | ICD-10-CM | POA: Diagnosis not present

## 2016-05-31 DIAGNOSIS — N186 End stage renal disease: Secondary | ICD-10-CM | POA: Diagnosis not present

## 2016-06-02 DIAGNOSIS — N186 End stage renal disease: Secondary | ICD-10-CM | POA: Diagnosis not present

## 2016-06-02 DIAGNOSIS — Z992 Dependence on renal dialysis: Secondary | ICD-10-CM | POA: Diagnosis not present

## 2016-06-05 DIAGNOSIS — N186 End stage renal disease: Secondary | ICD-10-CM | POA: Diagnosis not present

## 2016-06-05 DIAGNOSIS — Z992 Dependence on renal dialysis: Secondary | ICD-10-CM | POA: Diagnosis not present

## 2016-06-06 DIAGNOSIS — Z992 Dependence on renal dialysis: Secondary | ICD-10-CM | POA: Diagnosis not present

## 2016-06-06 DIAGNOSIS — N186 End stage renal disease: Secondary | ICD-10-CM | POA: Diagnosis not present

## 2016-06-07 DIAGNOSIS — Z992 Dependence on renal dialysis: Secondary | ICD-10-CM | POA: Diagnosis not present

## 2016-06-07 DIAGNOSIS — N186 End stage renal disease: Secondary | ICD-10-CM | POA: Diagnosis not present

## 2016-06-08 ENCOUNTER — Other Ambulatory Visit: Payer: Self-pay | Admitting: Obstetrics and Gynecology

## 2016-06-08 DIAGNOSIS — Z1231 Encounter for screening mammogram for malignant neoplasm of breast: Secondary | ICD-10-CM

## 2016-06-09 DIAGNOSIS — N186 End stage renal disease: Secondary | ICD-10-CM | POA: Diagnosis not present

## 2016-06-09 DIAGNOSIS — Z992 Dependence on renal dialysis: Secondary | ICD-10-CM | POA: Diagnosis not present

## 2016-06-12 DIAGNOSIS — Z992 Dependence on renal dialysis: Secondary | ICD-10-CM | POA: Diagnosis not present

## 2016-06-12 DIAGNOSIS — N186 End stage renal disease: Secondary | ICD-10-CM | POA: Diagnosis not present

## 2016-06-14 DIAGNOSIS — N186 End stage renal disease: Secondary | ICD-10-CM | POA: Diagnosis not present

## 2016-06-14 DIAGNOSIS — Z992 Dependence on renal dialysis: Secondary | ICD-10-CM | POA: Diagnosis not present

## 2016-06-16 DIAGNOSIS — N186 End stage renal disease: Secondary | ICD-10-CM | POA: Diagnosis not present

## 2016-06-16 DIAGNOSIS — Z992 Dependence on renal dialysis: Secondary | ICD-10-CM | POA: Diagnosis not present

## 2016-06-19 ENCOUNTER — Ambulatory Visit (HOSPITAL_COMMUNITY)
Admission: RE | Admit: 2016-06-19 | Discharge: 2016-06-19 | Disposition: A | Discharge status: Home / Self Care | Payer: Medicare HMO | Source: Ambulatory Visit | Attending: Obstetrics and Gynecology | Admitting: Obstetrics and Gynecology

## 2016-06-19 DIAGNOSIS — Z992 Dependence on renal dialysis: Secondary | ICD-10-CM | POA: Diagnosis not present

## 2016-06-19 DIAGNOSIS — R928 Other abnormal and inconclusive findings on diagnostic imaging of breast: Secondary | ICD-10-CM | POA: Diagnosis not present

## 2016-06-19 DIAGNOSIS — Z1231 Encounter for screening mammogram for malignant neoplasm of breast: Secondary | ICD-10-CM

## 2016-06-19 DIAGNOSIS — N186 End stage renal disease: Secondary | ICD-10-CM | POA: Diagnosis not present

## 2016-06-21 ENCOUNTER — Ambulatory Visit (INDEPENDENT_AMBULATORY_CARE_PROVIDER_SITE_OTHER): Payer: Medicare HMO | Admitting: *Deleted

## 2016-06-21 DIAGNOSIS — Z5181 Encounter for therapeutic drug level monitoring: Secondary | ICD-10-CM | POA: Diagnosis not present

## 2016-06-21 DIAGNOSIS — I4891 Unspecified atrial fibrillation: Secondary | ICD-10-CM

## 2016-06-21 DIAGNOSIS — N186 End stage renal disease: Secondary | ICD-10-CM | POA: Diagnosis not present

## 2016-06-21 DIAGNOSIS — Z992 Dependence on renal dialysis: Secondary | ICD-10-CM | POA: Diagnosis not present

## 2016-06-21 LAB — POCT INR: INR: 1.1

## 2016-06-22 ENCOUNTER — Other Ambulatory Visit: Payer: Self-pay | Admitting: Obstetrics and Gynecology

## 2016-06-22 DIAGNOSIS — R928 Other abnormal and inconclusive findings on diagnostic imaging of breast: Secondary | ICD-10-CM

## 2016-06-23 DIAGNOSIS — N186 End stage renal disease: Secondary | ICD-10-CM | POA: Diagnosis not present

## 2016-06-23 DIAGNOSIS — Z992 Dependence on renal dialysis: Secondary | ICD-10-CM | POA: Diagnosis not present

## 2016-06-26 ENCOUNTER — Ambulatory Visit (INDEPENDENT_AMBULATORY_CARE_PROVIDER_SITE_OTHER): Payer: Medicare HMO | Admitting: *Deleted

## 2016-06-26 DIAGNOSIS — Z5181 Encounter for therapeutic drug level monitoring: Secondary | ICD-10-CM

## 2016-06-26 DIAGNOSIS — I4891 Unspecified atrial fibrillation: Secondary | ICD-10-CM | POA: Diagnosis not present

## 2016-06-26 DIAGNOSIS — Z992 Dependence on renal dialysis: Secondary | ICD-10-CM | POA: Diagnosis not present

## 2016-06-26 DIAGNOSIS — N186 End stage renal disease: Secondary | ICD-10-CM | POA: Diagnosis not present

## 2016-06-26 LAB — POCT INR: INR: 1.1

## 2016-06-28 DIAGNOSIS — N186 End stage renal disease: Secondary | ICD-10-CM | POA: Diagnosis not present

## 2016-06-28 DIAGNOSIS — Z992 Dependence on renal dialysis: Secondary | ICD-10-CM | POA: Diagnosis not present

## 2016-06-30 DIAGNOSIS — Z992 Dependence on renal dialysis: Secondary | ICD-10-CM | POA: Diagnosis not present

## 2016-06-30 DIAGNOSIS — N186 End stage renal disease: Secondary | ICD-10-CM | POA: Diagnosis not present

## 2016-07-03 ENCOUNTER — Ambulatory Visit (INDEPENDENT_AMBULATORY_CARE_PROVIDER_SITE_OTHER): Payer: Medicare HMO | Admitting: *Deleted

## 2016-07-03 DIAGNOSIS — N186 End stage renal disease: Secondary | ICD-10-CM | POA: Diagnosis not present

## 2016-07-03 DIAGNOSIS — Z5181 Encounter for therapeutic drug level monitoring: Secondary | ICD-10-CM

## 2016-07-03 DIAGNOSIS — I4891 Unspecified atrial fibrillation: Secondary | ICD-10-CM | POA: Diagnosis not present

## 2016-07-03 DIAGNOSIS — Z992 Dependence on renal dialysis: Secondary | ICD-10-CM | POA: Diagnosis not present

## 2016-07-03 LAB — POCT INR: INR: 1.2

## 2016-07-04 DIAGNOSIS — H52223 Regular astigmatism, bilateral: Secondary | ICD-10-CM | POA: Diagnosis not present

## 2016-07-04 DIAGNOSIS — H5203 Hypermetropia, bilateral: Secondary | ICD-10-CM | POA: Diagnosis not present

## 2016-07-04 DIAGNOSIS — H31012 Macula scars of posterior pole (postinflammatory) (post-traumatic), left eye: Secondary | ICD-10-CM | POA: Diagnosis not present

## 2016-07-04 DIAGNOSIS — H524 Presbyopia: Secondary | ICD-10-CM | POA: Diagnosis not present

## 2016-07-05 DIAGNOSIS — Z992 Dependence on renal dialysis: Secondary | ICD-10-CM | POA: Diagnosis not present

## 2016-07-05 DIAGNOSIS — N186 End stage renal disease: Secondary | ICD-10-CM | POA: Diagnosis not present

## 2016-07-06 DIAGNOSIS — Z992 Dependence on renal dialysis: Secondary | ICD-10-CM | POA: Diagnosis not present

## 2016-07-06 DIAGNOSIS — N186 End stage renal disease: Secondary | ICD-10-CM | POA: Diagnosis not present

## 2016-07-07 DIAGNOSIS — N186 End stage renal disease: Secondary | ICD-10-CM | POA: Diagnosis not present

## 2016-07-07 DIAGNOSIS — Z992 Dependence on renal dialysis: Secondary | ICD-10-CM | POA: Diagnosis not present

## 2016-07-10 ENCOUNTER — Ambulatory Visit (INDEPENDENT_AMBULATORY_CARE_PROVIDER_SITE_OTHER): Payer: Medicare HMO | Admitting: *Deleted

## 2016-07-10 DIAGNOSIS — Z5181 Encounter for therapeutic drug level monitoring: Secondary | ICD-10-CM

## 2016-07-10 DIAGNOSIS — I4891 Unspecified atrial fibrillation: Secondary | ICD-10-CM

## 2016-07-10 DIAGNOSIS — N186 End stage renal disease: Secondary | ICD-10-CM | POA: Diagnosis not present

## 2016-07-10 DIAGNOSIS — Z992 Dependence on renal dialysis: Secondary | ICD-10-CM | POA: Diagnosis not present

## 2016-07-10 LAB — POCT INR: INR: 1.7

## 2016-07-11 ENCOUNTER — Ambulatory Visit (HOSPITAL_COMMUNITY)
Admission: RE | Admit: 2016-07-11 | Discharge: 2016-07-11 | Disposition: A | Discharge status: Home / Self Care | Payer: Medicare HMO | Source: Ambulatory Visit | Attending: Obstetrics and Gynecology | Admitting: Obstetrics and Gynecology

## 2016-07-11 DIAGNOSIS — N6002 Solitary cyst of left breast: Secondary | ICD-10-CM | POA: Diagnosis not present

## 2016-07-11 DIAGNOSIS — R928 Other abnormal and inconclusive findings on diagnostic imaging of breast: Secondary | ICD-10-CM

## 2016-07-11 DIAGNOSIS — N6320 Unspecified lump in the left breast, unspecified quadrant: Secondary | ICD-10-CM | POA: Diagnosis not present

## 2016-07-12 DIAGNOSIS — Z992 Dependence on renal dialysis: Secondary | ICD-10-CM | POA: Diagnosis not present

## 2016-07-12 DIAGNOSIS — N186 End stage renal disease: Secondary | ICD-10-CM | POA: Diagnosis not present

## 2016-07-14 DIAGNOSIS — N186 End stage renal disease: Secondary | ICD-10-CM | POA: Diagnosis not present

## 2016-07-14 DIAGNOSIS — Z992 Dependence on renal dialysis: Secondary | ICD-10-CM | POA: Diagnosis not present

## 2016-07-17 ENCOUNTER — Ambulatory Visit (INDEPENDENT_AMBULATORY_CARE_PROVIDER_SITE_OTHER): Payer: Medicare HMO | Admitting: *Deleted

## 2016-07-17 DIAGNOSIS — N186 End stage renal disease: Secondary | ICD-10-CM | POA: Diagnosis not present

## 2016-07-17 DIAGNOSIS — Z5181 Encounter for therapeutic drug level monitoring: Secondary | ICD-10-CM

## 2016-07-17 DIAGNOSIS — I4891 Unspecified atrial fibrillation: Secondary | ICD-10-CM

## 2016-07-17 DIAGNOSIS — Z992 Dependence on renal dialysis: Secondary | ICD-10-CM | POA: Diagnosis not present

## 2016-07-17 LAB — POCT INR: INR: 2.2

## 2016-07-19 DIAGNOSIS — N186 End stage renal disease: Secondary | ICD-10-CM | POA: Diagnosis not present

## 2016-07-19 DIAGNOSIS — Z992 Dependence on renal dialysis: Secondary | ICD-10-CM | POA: Diagnosis not present

## 2016-07-21 DIAGNOSIS — Z992 Dependence on renal dialysis: Secondary | ICD-10-CM | POA: Diagnosis not present

## 2016-07-21 DIAGNOSIS — N186 End stage renal disease: Secondary | ICD-10-CM | POA: Diagnosis not present

## 2016-07-24 DIAGNOSIS — Z992 Dependence on renal dialysis: Secondary | ICD-10-CM | POA: Diagnosis not present

## 2016-07-24 DIAGNOSIS — N186 End stage renal disease: Secondary | ICD-10-CM | POA: Diagnosis not present

## 2016-07-26 ENCOUNTER — Ambulatory Visit (INDEPENDENT_AMBULATORY_CARE_PROVIDER_SITE_OTHER): Payer: Medicare HMO | Admitting: *Deleted

## 2016-07-26 DIAGNOSIS — I4891 Unspecified atrial fibrillation: Secondary | ICD-10-CM

## 2016-07-26 DIAGNOSIS — Z992 Dependence on renal dialysis: Secondary | ICD-10-CM | POA: Diagnosis not present

## 2016-07-26 DIAGNOSIS — N186 End stage renal disease: Secondary | ICD-10-CM | POA: Diagnosis not present

## 2016-07-26 DIAGNOSIS — Z5181 Encounter for therapeutic drug level monitoring: Secondary | ICD-10-CM | POA: Diagnosis not present

## 2016-07-26 LAB — POCT INR: INR: 2.5

## 2016-07-26 MED ORDER — WARFARIN SODIUM 5 MG PO TABS: ORAL_TABLET | ORAL | 4 refills | Status: DC

## 2016-07-28 DIAGNOSIS — Z992 Dependence on renal dialysis: Secondary | ICD-10-CM | POA: Diagnosis not present

## 2016-07-28 DIAGNOSIS — N186 End stage renal disease: Secondary | ICD-10-CM | POA: Diagnosis not present

## 2016-07-30 DIAGNOSIS — Z992 Dependence on renal dialysis: Secondary | ICD-10-CM | POA: Diagnosis not present

## 2016-07-30 DIAGNOSIS — N186 End stage renal disease: Secondary | ICD-10-CM | POA: Diagnosis not present

## 2016-08-02 DIAGNOSIS — Z992 Dependence on renal dialysis: Secondary | ICD-10-CM | POA: Diagnosis not present

## 2016-08-02 DIAGNOSIS — N186 End stage renal disease: Secondary | ICD-10-CM | POA: Diagnosis not present

## 2016-08-04 DIAGNOSIS — N186 End stage renal disease: Secondary | ICD-10-CM | POA: Diagnosis not present

## 2016-08-04 DIAGNOSIS — Z992 Dependence on renal dialysis: Secondary | ICD-10-CM | POA: Diagnosis not present

## 2016-08-06 DIAGNOSIS — N186 End stage renal disease: Secondary | ICD-10-CM | POA: Diagnosis not present

## 2016-08-06 DIAGNOSIS — Z992 Dependence on renal dialysis: Secondary | ICD-10-CM | POA: Diagnosis not present

## 2016-08-07 DIAGNOSIS — N186 End stage renal disease: Secondary | ICD-10-CM | POA: Diagnosis not present

## 2016-08-07 DIAGNOSIS — Z992 Dependence on renal dialysis: Secondary | ICD-10-CM | POA: Diagnosis not present

## 2016-08-09 DIAGNOSIS — N186 End stage renal disease: Secondary | ICD-10-CM | POA: Diagnosis not present

## 2016-08-09 DIAGNOSIS — Z992 Dependence on renal dialysis: Secondary | ICD-10-CM | POA: Diagnosis not present

## 2016-08-11 ENCOUNTER — Ambulatory Visit (INDEPENDENT_AMBULATORY_CARE_PROVIDER_SITE_OTHER): Payer: Medicare HMO | Admitting: Cardiovascular Disease

## 2016-08-11 ENCOUNTER — Encounter: Payer: Self-pay | Admitting: Cardiovascular Disease

## 2016-08-11 VITALS — BP 118/70 | HR 96 | Ht 64.0 in | Wt 219.6 lb

## 2016-08-11 DIAGNOSIS — N186 End stage renal disease: Secondary | ICD-10-CM | POA: Diagnosis not present

## 2016-08-11 DIAGNOSIS — I482 Chronic atrial fibrillation: Secondary | ICD-10-CM | POA: Diagnosis not present

## 2016-08-11 DIAGNOSIS — I4821 Permanent atrial fibrillation: Secondary | ICD-10-CM

## 2016-08-11 DIAGNOSIS — I5032 Chronic diastolic (congestive) heart failure: Secondary | ICD-10-CM | POA: Diagnosis not present

## 2016-08-11 DIAGNOSIS — Z7901 Long term (current) use of anticoagulants: Secondary | ICD-10-CM

## 2016-08-11 DIAGNOSIS — Z992 Dependence on renal dialysis: Secondary | ICD-10-CM | POA: Diagnosis not present

## 2016-08-11 NOTE — Patient Instructions (Signed)
Dr Croitoru recommends that you schedule a follow-up appointment in 1 year. You will receive a reminder letter in the mail two months in advance. If you don't receive a letter, please call our office to schedule the follow-up appointment.  If you need a refill on your cardiac medications before your next appointment, please call your pharmacy. 

## 2016-08-11 NOTE — Progress Notes (Signed)
Cardiology Office Note    Date:  08/11/2016   ID:  Sabrina Beck, DOB 1941-02-14, MRN [id number]  PCP:  Glo Herring., MD  Cardiologist:   Sanda Klein, MD   Chief Complaint  Patient presents with  . Follow-up    pt has no complaints today     History of Present Illness:  Sabrina Beck is a 76 y.o. female with permanent atrial fibrillation, diastolic heart failure and end-stage renal disease returning for follow-up. She has adjusted very well to hemodialysis. She has to skip her metoprolol on dialysis mornings and only takes 25 mg twice a day otherwise. After making this reduction in beta blocker dose, she has not had to interrupt dialysis for hypotension. Her ventricular rate control is imperfect but acceptable with heart rates in the 90s. She has not had any bleeding problems other than protracted oozing at the dialysis site occasionally. She has not had any neurological events. She denies exertional dyspnea or angina.  She is known to have a mild to moderately dilated left atrium, but otherwise no significant valvular or myocardial abnormalities on previous echoes. She has never undergone coronary angiography but had a normal nuclear stress test in 2007 and does not have angina pectoris.  Past Medical History:  Diagnosis Date  . Anemia 03/27/2011  . Atrial fibrillation ([place])   . Cancer ([place])    uterine  . Chest pain 05/15/2006   stress test perform negative for ischemia  . CHF (congestive heart failure) ([place])   . Complication of anesthesia   . Diverticulosis   . Dysrhythmia    afib  . Family history of anesthesia complication    "brother has nausea when put to sleep."  . GERD (gastroesophageal reflux disease)   . Headache   . Kidney disease    stage 4 chronic  . Mixed hyperlipidemia   . Murmur 06/04/2006,09/29/10   2D Echo perform both test shows EF 55%  . Peripheral neuropathy ([place])   . PONV (postoperative nausea and vomiting)   . Renal disorder   . Shortness  of breath    with exertion  . Systemic hypertension     Past Surgical History:  Procedure Laterality Date  . ABDOMINAL HYSTERECTOMY    . APPENDECTOMY    . AV FISTULA PLACEMENT Left 11/17/2013   Procedure: CREATION OF LEFT RADIOCEPHALIC ARTERIOVENOUS (AV) FISTULA ;  Surgeon: [name], MD;  Location: [facility];  Service: Vascular;  Laterality: Left;  . BREAST SURGERY Left    tumors non canerous 2- 3 different surgeries  . CHOLECYSTECTOMY    . COLONOSCOPY W/ BIOPSIES AND POLYPECTOMY    . FISTULA SUPERFICIALIZATION Left 05/19/2014   Procedure: FISTULA SUPERFICIALIZATION-LEFT ARM;  Surgeon: [name], MD;  Location: [facility];  Service: Vascular;  Laterality: Left;  . INSERTION OF DIALYSIS CATHETER N/A 01/24/2014   Procedure: INSERTION OF DIALYSIS CATHETER;  Surgeon: Rosetta Posner, MD;  Location: [facility] OR;  Service: Vascular;  Laterality: N/A;  . KNEE ARTHROSCOPY    . TUBAL LIGATION      Current Medications: Outpatient Medications Prior to Visit  Medication Sig Dispense Refill  . acetaminophen (TYLENOL) 500 MG tablet Take 1,000 mg by mouth every 6 (six) hours as needed for moderate pain.     . calcium acetate (PHOSLO) 667 MG capsule Take 3 capsules by mouth 3 (three) times daily.     . Cholecalciferol (VITAMIN D) 2000 UNITS CAPS Take 1 capsule by mouth daily.    Marland Kitchen  multivitamin (RENA-VIT) TABS tablet Take 1 tablet by mouth daily.    . Omega-3 Fatty Acids (FISH OIL) 1200 MG CAPS Take 1,200 mg by mouth 2 (two) times daily.     Marland Kitchen omeprazole (PRILOSEC) 20 MG capsule Take 20 mg by mouth daily.    Marland Kitchen Propylene Glycol (SYSTANE BALANCE) 0.6 % SOLN Place 1 drop into both eyes as needed (for burning or dry eyes).    . RENVELA 800 MG tablet Take 1,600 mg by mouth 3 (three) times daily.    Marland Kitchen warfarin (COUMADIN) 5 MG tablet Take coumadin 1 1/2 tablets daily 50 tablet 4  . metoprolol (LOPRESSOR) 50 MG tablet TAKE ONE TABLET BY MOUTH TWICE A DAY (Patient not taking: Reported on 08/11/2016) 60 tablet 9    No facility-administered medications prior to visit.      Allergies:   Patient has no known allergies.   Social History   Social History  . Marital status: Widowed    Spouse name: N/A  . Number of children: N/A  . Years of education: N/A   Social History Main Topics  . Smoking status: Former Smoker    Quit date: 08/08/1999  . Smokeless tobacco: Never Used  . Alcohol use No  . Drug use: No  . Sexual activity: Not Currently   Other Topics Concern  . None   Social History Narrative   Lives in a one story home.  One of her sons lives with her.  Has 2 sons.  Retired Oncologist.  Eduction: high school.     Family History:  The patient's family history includes Cancer in her brother; Diabetes in her brother, father, mother, and sister; Heart attack in her brother and father; Heart disease in her brother and father; Hyperlipidemia in her sister; Hypertension in her brother, mother, and sister.   ROS:   Please see the history of present illness.    ROS All other systems reviewed and are negative.   PHYSICAL EXAM:   VS:  BP 118/70   Pulse 96   Ht 5\' 4"  (1.626 m)   Wt 219 lb 9.6 oz (99.6 kg)   BMI 37.69 kg/m    GEN: Well nourished, well developed, in no acute distress  HEENT: normal  Neck: no JVD, carotid bruits, or masses Cardiac: irregular; no murmurs, rubs, or gallops,no edema ; left forearm AV fistula with excellent thrill and bruit Respiratory:  clear to auscultation bilaterally, normal work of breathing GI: soft, nontender, nondistended, + BS MS: no deformity or atrophy  Skin: warm and dry, no rash Neuro:  Alert and Oriented x 3, Strength and sensation are intact Psych: euthymic mood, full affect  Wt Readings from Last 3 Encounters:  08/11/16 219 lb 9.6 oz (99.6 kg)  04/01/16 225 lb (102.1 kg)  06/14/15 213 lb 3 oz (96.7 kg)      Studies/Labs Reviewed:   EKG:  EKG is ordered today.  The ekg ordered today demonstrates Atrial fibrillation with ventricular  rate 96, minor nonspecific intraventricular conduction delay with left axis deviation (QRS 104 ms, QTC 495 ms).  Recent Labs: No results found for requested labs within last 8760 hours.   Lipid Panel    Component Value Date/Time   CHOL 87 (L) 06/08/2015 0735   TRIG 272 (H) 06/08/2015 0735   HDL 25 (L) 06/08/2015 0735   LDLCALC 8 06/08/2015 0735       ASSESSMENT:    1. Permanent atrial fibrillation ([place])   2. Chronic diastolic CHF (  congestive heart failure) ([place])   3. End stage renal disease ([place])   4. Long term current use of anticoagulant therapy      PLAN:  In order of problems listed above:  1. AFib: Since she has preserved left ventricular systolic function, a ventricular rate in the 90-100 rate is acceptable. She does not have any signs or symptoms of congestive heart failure. CHADSVasc 3-4 (age 57, gender, questionable CHF) 2. CHF: For starting dialysis this has not been a problem - diastolic heart failure may simply have been a manifestation of fluid overload due to renal disease. 3. ESRD: Compliant and well tolerant of dialysis. 4. Tolerating warfarin anticoagulation without serious bleeding problems    Medication Adjustments/Labs and Tests Ordered: Current medicines are reviewed at length with the patient today.  Concerns regarding medicines are outlined above.  Medication changes, Labs and Tests ordered today are listed in the Patient Instructions below. Patient Instructions  Dr Sallyanne Kuster recommends that you schedule a follow-up appointment in 1 year. You will receive a reminder letter in the mail two months in advance. If you don't receive a letter, please call our office to schedule the follow-up appointment.  If you need a refill on your cardiac medications before your next appointment, please call your pharmacy.    Signed, Sanda Klein, MD  08/11/2016 6:04 PM    [address], [place], [place]  [address] Phone: [phone]; Fax: [phone]

## 2016-08-14 ENCOUNTER — Ambulatory Visit (INDEPENDENT_AMBULATORY_CARE_PROVIDER_SITE_OTHER): Payer: Medicare HMO | Admitting: *Deleted

## 2016-08-14 DIAGNOSIS — I4891 Unspecified atrial fibrillation: Secondary | ICD-10-CM

## 2016-08-14 DIAGNOSIS — Z992 Dependence on renal dialysis: Secondary | ICD-10-CM | POA: Diagnosis not present

## 2016-08-14 DIAGNOSIS — Z5181 Encounter for therapeutic drug level monitoring: Secondary | ICD-10-CM

## 2016-08-14 DIAGNOSIS — N186 End stage renal disease: Secondary | ICD-10-CM | POA: Diagnosis not present

## 2016-08-14 LAB — POCT INR: INR: 2.3

## 2016-08-16 DIAGNOSIS — Z992 Dependence on renal dialysis: Secondary | ICD-10-CM | POA: Diagnosis not present

## 2016-08-16 DIAGNOSIS — N186 End stage renal disease: Secondary | ICD-10-CM | POA: Diagnosis not present

## 2016-08-18 DIAGNOSIS — N186 End stage renal disease: Secondary | ICD-10-CM | POA: Diagnosis not present

## 2016-08-18 DIAGNOSIS — Z992 Dependence on renal dialysis: Secondary | ICD-10-CM | POA: Diagnosis not present

## 2016-08-21 DIAGNOSIS — N186 End stage renal disease: Secondary | ICD-10-CM | POA: Diagnosis not present

## 2016-08-21 DIAGNOSIS — Z992 Dependence on renal dialysis: Secondary | ICD-10-CM | POA: Diagnosis not present

## 2016-08-23 DIAGNOSIS — Z992 Dependence on renal dialysis: Secondary | ICD-10-CM | POA: Diagnosis not present

## 2016-08-23 DIAGNOSIS — N186 End stage renal disease: Secondary | ICD-10-CM | POA: Diagnosis not present

## 2016-08-25 DIAGNOSIS — N186 End stage renal disease: Secondary | ICD-10-CM | POA: Diagnosis not present

## 2016-08-25 DIAGNOSIS — Z992 Dependence on renal dialysis: Secondary | ICD-10-CM | POA: Diagnosis not present

## 2016-08-28 DIAGNOSIS — N186 End stage renal disease: Secondary | ICD-10-CM | POA: Diagnosis not present

## 2016-08-28 DIAGNOSIS — Z992 Dependence on renal dialysis: Secondary | ICD-10-CM | POA: Diagnosis not present

## 2016-08-30 DIAGNOSIS — N186 End stage renal disease: Secondary | ICD-10-CM | POA: Diagnosis not present

## 2016-08-30 DIAGNOSIS — Z992 Dependence on renal dialysis: Secondary | ICD-10-CM | POA: Diagnosis not present

## 2016-09-01 DIAGNOSIS — N186 End stage renal disease: Secondary | ICD-10-CM | POA: Diagnosis not present

## 2016-09-01 DIAGNOSIS — Z992 Dependence on renal dialysis: Secondary | ICD-10-CM | POA: Diagnosis not present

## 2016-09-04 DIAGNOSIS — Z992 Dependence on renal dialysis: Secondary | ICD-10-CM | POA: Diagnosis not present

## 2016-09-04 DIAGNOSIS — N186 End stage renal disease: Secondary | ICD-10-CM | POA: Diagnosis not present

## 2016-09-06 DIAGNOSIS — N186 End stage renal disease: Secondary | ICD-10-CM | POA: Diagnosis not present

## 2016-09-06 DIAGNOSIS — Z992 Dependence on renal dialysis: Secondary | ICD-10-CM | POA: Diagnosis not present

## 2016-09-08 DIAGNOSIS — Z992 Dependence on renal dialysis: Secondary | ICD-10-CM | POA: Diagnosis not present

## 2016-09-08 DIAGNOSIS — N186 End stage renal disease: Secondary | ICD-10-CM | POA: Diagnosis not present

## 2016-09-11 ENCOUNTER — Ambulatory Visit (INDEPENDENT_AMBULATORY_CARE_PROVIDER_SITE_OTHER): Payer: Medicare HMO | Admitting: *Deleted

## 2016-09-11 DIAGNOSIS — I4891 Unspecified atrial fibrillation: Secondary | ICD-10-CM

## 2016-09-11 DIAGNOSIS — Z5181 Encounter for therapeutic drug level monitoring: Secondary | ICD-10-CM | POA: Diagnosis not present

## 2016-09-11 DIAGNOSIS — N186 End stage renal disease: Secondary | ICD-10-CM | POA: Diagnosis not present

## 2016-09-11 DIAGNOSIS — Z992 Dependence on renal dialysis: Secondary | ICD-10-CM | POA: Diagnosis not present

## 2016-09-11 LAB — POCT INR: INR: 2.2

## 2016-09-13 DIAGNOSIS — N186 End stage renal disease: Secondary | ICD-10-CM | POA: Diagnosis not present

## 2016-09-13 DIAGNOSIS — Z992 Dependence on renal dialysis: Secondary | ICD-10-CM | POA: Diagnosis not present

## 2016-09-15 DIAGNOSIS — N186 End stage renal disease: Secondary | ICD-10-CM | POA: Diagnosis not present

## 2016-09-15 DIAGNOSIS — Z992 Dependence on renal dialysis: Secondary | ICD-10-CM | POA: Diagnosis not present

## 2016-09-18 DIAGNOSIS — Z992 Dependence on renal dialysis: Secondary | ICD-10-CM | POA: Diagnosis not present

## 2016-09-18 DIAGNOSIS — N186 End stage renal disease: Secondary | ICD-10-CM | POA: Diagnosis not present

## 2016-09-20 DIAGNOSIS — N186 End stage renal disease: Secondary | ICD-10-CM | POA: Diagnosis not present

## 2016-09-20 DIAGNOSIS — Z992 Dependence on renal dialysis: Secondary | ICD-10-CM | POA: Diagnosis not present

## 2016-09-22 DIAGNOSIS — N186 End stage renal disease: Secondary | ICD-10-CM | POA: Diagnosis not present

## 2016-09-22 DIAGNOSIS — Z992 Dependence on renal dialysis: Secondary | ICD-10-CM | POA: Diagnosis not present

## 2016-09-25 DIAGNOSIS — Z992 Dependence on renal dialysis: Secondary | ICD-10-CM | POA: Diagnosis not present

## 2016-09-25 DIAGNOSIS — H2511 Age-related nuclear cataract, right eye: Secondary | ICD-10-CM | POA: Diagnosis not present

## 2016-09-25 DIAGNOSIS — N186 End stage renal disease: Secondary | ICD-10-CM | POA: Diagnosis not present

## 2016-09-25 DIAGNOSIS — H31012 Macula scars of posterior pole (postinflammatory) (post-traumatic), left eye: Secondary | ICD-10-CM | POA: Diagnosis not present

## 2016-09-25 DIAGNOSIS — H2513 Age-related nuclear cataract, bilateral: Secondary | ICD-10-CM | POA: Diagnosis not present

## 2016-09-27 DIAGNOSIS — Z992 Dependence on renal dialysis: Secondary | ICD-10-CM | POA: Diagnosis not present

## 2016-09-27 DIAGNOSIS — N186 End stage renal disease: Secondary | ICD-10-CM | POA: Diagnosis not present

## 2016-09-29 DIAGNOSIS — N186 End stage renal disease: Secondary | ICD-10-CM | POA: Diagnosis not present

## 2016-09-29 DIAGNOSIS — Z992 Dependence on renal dialysis: Secondary | ICD-10-CM | POA: Diagnosis not present

## 2016-10-02 DIAGNOSIS — Z992 Dependence on renal dialysis: Secondary | ICD-10-CM | POA: Diagnosis not present

## 2016-10-02 DIAGNOSIS — N186 End stage renal disease: Secondary | ICD-10-CM | POA: Diagnosis not present

## 2016-10-04 DIAGNOSIS — N186 End stage renal disease: Secondary | ICD-10-CM | POA: Diagnosis not present

## 2016-10-04 DIAGNOSIS — Z992 Dependence on renal dialysis: Secondary | ICD-10-CM | POA: Diagnosis not present

## 2016-10-06 DIAGNOSIS — Z992 Dependence on renal dialysis: Secondary | ICD-10-CM | POA: Diagnosis not present

## 2016-10-06 DIAGNOSIS — N186 End stage renal disease: Secondary | ICD-10-CM | POA: Diagnosis not present

## 2016-10-09 DIAGNOSIS — N186 End stage renal disease: Secondary | ICD-10-CM | POA: Diagnosis not present

## 2016-10-09 DIAGNOSIS — Z992 Dependence on renal dialysis: Secondary | ICD-10-CM | POA: Diagnosis not present

## 2016-10-11 DIAGNOSIS — Z992 Dependence on renal dialysis: Secondary | ICD-10-CM | POA: Diagnosis not present

## 2016-10-11 DIAGNOSIS — N186 End stage renal disease: Secondary | ICD-10-CM | POA: Diagnosis not present

## 2016-10-13 DIAGNOSIS — Z992 Dependence on renal dialysis: Secondary | ICD-10-CM | POA: Diagnosis not present

## 2016-10-13 DIAGNOSIS — N186 End stage renal disease: Secondary | ICD-10-CM | POA: Diagnosis not present

## 2016-10-16 DIAGNOSIS — N186 End stage renal disease: Secondary | ICD-10-CM | POA: Diagnosis not present

## 2016-10-16 DIAGNOSIS — Z992 Dependence on renal dialysis: Secondary | ICD-10-CM | POA: Diagnosis not present

## 2016-10-16 NOTE — Patient Instructions (Signed)
Your procedure is scheduled on: 10/26/2016  Report to [facility] at  640  AM.  Call this number if you have problems the morning of surgery: [phone]   Do not eat food or drink liquids :After Midnight.      Take these medicines the morning of surgery with A SIP OF WATER: metoprolol, prilosec, renvela   Do not wear jewelry, make-up or nail polish.  Do not wear lotions, powders, or perfumes. You may wear deodorant.  Do not shave 48 hours prior to surgery.  Do not bring valuables to the hospital.  Contacts, dentures or bridgework may not be worn into surgery.  Leave suitcase in the car. After surgery it may be brought to your room.  For patients admitted to the hospital, checkout time is 11:00 AM the day of discharge.   Patients discharged the day of surgery will not be allowed to drive home.  :     Please read over the following fact sheets that you were given: Coughing and Deep Breathing, Surgical Site Infection Prevention, Anesthesia Post-op Instructions and Care and Recovery After Surgery    Cataract A cataract is a clouding of the lens of the eye. When a lens becomes cloudy, vision is reduced based on the degree and nature of the clouding. Many cataracts reduce vision to some degree. Some cataracts make people more near-sighted as they develop. Other cataracts increase glare. Cataracts that are ignored and become worse can sometimes look white. The white color can be seen through the pupil. CAUSES   Aging. However, cataracts may occur at any age, even in newborns.   Certain drugs.   Trauma to the eye.   Certain diseases such as diabetes.   Specific eye diseases such as chronic inflammation inside the eye or a sudden attack of a rare form of glaucoma.   Inherited or acquired medical problems.  SYMPTOMS   Gradual, progressive drop in vision in the affected eye.   Severe, rapid visual loss. This most often happens when trauma is the cause.  DIAGNOSIS  To detect a cataract,  an eye doctor examines the lens. Cataracts are best diagnosed with an exam of the eyes with the pupils enlarged (dilated) by drops.  TREATMENT  For an early cataract, vision may improve by using different eyeglasses or stronger lighting. If that does not help your vision, surgery is the only effective treatment. A cataract needs to be surgically removed when vision loss interferes with your everyday activities, such as driving, reading, or watching TV. A cataract may also have to be removed if it prevents examination or treatment of another eye problem. Surgery removes the cloudy lens and usually replaces it with a substitute lens (intraocular lens, IOL).  At a time when both you and your doctor agree, the cataract will be surgically removed. If you have cataracts in both eyes, only one is usually removed at a time. This allows the operated eye to heal and be out of danger from any possible problems after surgery (such as infection or poor wound healing). In rare cases, a cataract may be doing damage to your eye. In these cases, your caregiver may advise surgical removal right away. The vast majority of people who have cataract surgery have better vision afterward. HOME CARE INSTRUCTIONS  If you are not planning surgery, you may be asked to do the following:  Use different eyeglasses.   Use stronger or brighter lighting.   Ask your eye doctor about reducing your medicine  dose or changing medicines if it is thought that a medicine caused your cataract. Changing medicines does not make the cataract go away on its own.   Become familiar with your surroundings. Poor vision can lead to injury. Avoid bumping into things on the affected side. You are at a higher risk for tripping or falling.   Exercise extreme care when driving or operating machinery.   Wear sunglasses if you are sensitive to bright light or experiencing problems with glare.  SEEK IMMEDIATE MEDICAL CARE IF:   You have a worsening or  sudden vision loss.   You notice redness, swelling, or increasing pain in the eye.   You have a fever.  Document Released: 07/24/2005 Document Revised: 07/13/2011 Document Reviewed: 03/17/2011 [facility] Patient Information 2012 [place].PATIENT INSTRUCTIONS POST-ANESTHESIA  IMMEDIATELY FOLLOWING SURGERY:  Do not drive or operate machinery for the first twenty four hours after surgery.  Do not make any important decisions for twenty four hours after surgery or while taking narcotic pain medications or sedatives.  If you develop intractable nausea and vomiting or a severe headache please notify your doctor immediately.  FOLLOW-UP:  Please make an appointment with your surgeon as instructed. You do not need to follow up with anesthesia unless specifically instructed to do so.  WOUND CARE INSTRUCTIONS (if applicable):  Keep a dry clean dressing on the anesthesia/puncture wound site if there is drainage.  Once the wound has quit draining you may leave it open to air.  Generally you should leave the bandage intact for twenty four hours unless there is drainage.  If the epidural site drains for more than 36-48 hours please call the anesthesia department.  QUESTIONS?:  Please feel free to call your physician or the hospital operator if you have any questions, and they will be happy to assist you.

## 2016-10-18 DIAGNOSIS — N186 End stage renal disease: Secondary | ICD-10-CM | POA: Diagnosis not present

## 2016-10-18 DIAGNOSIS — Z992 Dependence on renal dialysis: Secondary | ICD-10-CM | POA: Diagnosis not present

## 2016-10-19 ENCOUNTER — Encounter (HOSPITAL_COMMUNITY)
Admission: RE | Admit: 2016-10-19 | Discharge: 2016-10-19 | Disposition: A | Discharge status: Home / Self Care | Payer: Medicare HMO | Source: Ambulatory Visit | Attending: Ophthalmology | Admitting: Ophthalmology

## 2016-10-19 DIAGNOSIS — E782 Mixed hyperlipidemia: Secondary | ICD-10-CM | POA: Insufficient documentation

## 2016-10-19 DIAGNOSIS — I132 Hypertensive heart and chronic kidney disease with heart failure and with stage 5 chronic kidney disease, or end stage renal disease: Secondary | ICD-10-CM | POA: Insufficient documentation

## 2016-10-19 DIAGNOSIS — N186 End stage renal disease: Secondary | ICD-10-CM | POA: Insufficient documentation

## 2016-10-19 DIAGNOSIS — Z992 Dependence on renal dialysis: Secondary | ICD-10-CM | POA: Insufficient documentation

## 2016-10-19 DIAGNOSIS — Z01818 Encounter for other preprocedural examination: Secondary | ICD-10-CM | POA: Insufficient documentation

## 2016-10-19 DIAGNOSIS — I4891 Unspecified atrial fibrillation: Secondary | ICD-10-CM | POA: Insufficient documentation

## 2016-10-19 DIAGNOSIS — D649 Anemia, unspecified: Secondary | ICD-10-CM | POA: Insufficient documentation

## 2016-10-19 DIAGNOSIS — Z7901 Long term (current) use of anticoagulants: Secondary | ICD-10-CM | POA: Insufficient documentation

## 2016-10-19 DIAGNOSIS — I509 Heart failure, unspecified: Secondary | ICD-10-CM | POA: Insufficient documentation

## 2016-10-20 DIAGNOSIS — N186 End stage renal disease: Secondary | ICD-10-CM | POA: Diagnosis not present

## 2016-10-20 DIAGNOSIS — Z992 Dependence on renal dialysis: Secondary | ICD-10-CM | POA: Diagnosis not present

## 2016-10-23 ENCOUNTER — Ambulatory Visit (INDEPENDENT_AMBULATORY_CARE_PROVIDER_SITE_OTHER): Payer: Medicare HMO | Admitting: *Deleted

## 2016-10-23 DIAGNOSIS — Z5181 Encounter for therapeutic drug level monitoring: Secondary | ICD-10-CM | POA: Diagnosis not present

## 2016-10-23 DIAGNOSIS — N186 End stage renal disease: Secondary | ICD-10-CM | POA: Diagnosis not present

## 2016-10-23 DIAGNOSIS — I4891 Unspecified atrial fibrillation: Secondary | ICD-10-CM | POA: Diagnosis not present

## 2016-10-23 DIAGNOSIS — Z992 Dependence on renal dialysis: Secondary | ICD-10-CM | POA: Diagnosis not present

## 2016-10-23 LAB — POCT INR: INR: 2.4

## 2016-10-25 DIAGNOSIS — N186 End stage renal disease: Secondary | ICD-10-CM | POA: Diagnosis not present

## 2016-10-25 DIAGNOSIS — Z992 Dependence on renal dialysis: Secondary | ICD-10-CM | POA: Diagnosis not present

## 2016-10-25 MED ORDER — CYCLOPENTOLATE-PHENYLEPHRINE 0.2-1 % OP SOLN
OPHTHALMIC | Status: AC
  Filled 2016-10-25: qty 2

## 2016-10-25 MED ORDER — PHENYLEPHRINE HCL 2.5 % OP SOLN
OPHTHALMIC | Status: AC
  Filled 2016-10-25: qty 15

## 2016-10-25 MED ORDER — TETRACAINE HCL 0.5 % OP SOLN
OPHTHALMIC | Status: AC
  Filled 2016-10-25: qty 4

## 2016-10-25 MED ORDER — LIDOCAINE HCL 3.5 % OP GEL
OPHTHALMIC | Status: AC
  Filled 2016-10-25: qty 1

## 2016-10-25 MED ORDER — NEOMYCIN-POLYMYXIN-DEXAMETH 3.5-10000-0.1 OP SUSP
OPHTHALMIC | Status: AC
  Filled 2016-10-25: qty 5

## 2016-10-26 ENCOUNTER — Encounter (HOSPITAL_COMMUNITY)
Admission: RE | Disposition: A | Discharge status: Home / Self Care | Payer: Self-pay | Source: Ambulatory Visit | Attending: Ophthalmology

## 2016-10-26 ENCOUNTER — Ambulatory Visit (HOSPITAL_COMMUNITY): Payer: Medicare HMO | Admitting: Anesthesiology

## 2016-10-26 ENCOUNTER — Encounter (HOSPITAL_COMMUNITY): Payer: Self-pay | Admitting: Anesthesiology

## 2016-10-26 ENCOUNTER — Ambulatory Visit (HOSPITAL_COMMUNITY)
Admission: RE | Admit: 2016-10-26 | Discharge: 2016-10-26 | Disposition: A | Discharge status: Home / Self Care | Payer: Medicare HMO | Source: Ambulatory Visit | Attending: Ophthalmology | Admitting: Ophthalmology

## 2016-10-26 DIAGNOSIS — Z79899 Other long term (current) drug therapy: Secondary | ICD-10-CM | POA: Diagnosis not present

## 2016-10-26 DIAGNOSIS — I509 Heart failure, unspecified: Secondary | ICD-10-CM | POA: Insufficient documentation

## 2016-10-26 DIAGNOSIS — D649 Anemia, unspecified: Secondary | ICD-10-CM | POA: Diagnosis not present

## 2016-10-26 DIAGNOSIS — K219 Gastro-esophageal reflux disease without esophagitis: Secondary | ICD-10-CM | POA: Insufficient documentation

## 2016-10-26 DIAGNOSIS — I4891 Unspecified atrial fibrillation: Secondary | ICD-10-CM | POA: Diagnosis not present

## 2016-10-26 DIAGNOSIS — Z87891 Personal history of nicotine dependence: Secondary | ICD-10-CM | POA: Diagnosis not present

## 2016-10-26 DIAGNOSIS — N186 End stage renal disease: Secondary | ICD-10-CM | POA: Diagnosis not present

## 2016-10-26 DIAGNOSIS — I110 Hypertensive heart disease with heart failure: Secondary | ICD-10-CM | POA: Diagnosis not present

## 2016-10-26 DIAGNOSIS — R51 Headache: Secondary | ICD-10-CM | POA: Insufficient documentation

## 2016-10-26 DIAGNOSIS — H2511 Age-related nuclear cataract, right eye: Secondary | ICD-10-CM | POA: Insufficient documentation

## 2016-10-26 DIAGNOSIS — G4730 Sleep apnea, unspecified: Secondary | ICD-10-CM | POA: Insufficient documentation

## 2016-10-26 HISTORY — PX: CATARACT EXTRACTION W/PHACO: SHX586

## 2016-10-26 LAB — POCT I-STAT, CHEM 8
BUN: 30 mg/dL — ABNORMAL HIGH (ref 6–20)
CHLORIDE: 97 mmol/L — AB (ref 101–111)
Calcium, Ion: 1.09 mmol/L — ABNORMAL LOW (ref 1.15–1.40)
Creatinine, Ser: 7.4 mg/dL — ABNORMAL HIGH (ref 0.44–1.00)
Glucose, Bld: 112 mg/dL — ABNORMAL HIGH (ref 65–99)
HEMATOCRIT: 31 % — AB (ref 36.0–46.0)
Hemoglobin: 10.5 g/dL — ABNORMAL LOW (ref 12.0–15.0)
POTASSIUM: 3.6 mmol/L (ref 3.5–5.1)
Sodium: 139 mmol/L (ref 135–145)
TCO2: 29 mmol/L (ref 0–100)

## 2016-10-26 SURGERY — PHACOEMULSIFICATION, CATARACT, WITH IOL INSERTION
Anesthesia: Monitor Anesthesia Care | Site: Eye | Laterality: Right

## 2016-10-26 MED ORDER — MIDAZOLAM HCL 2 MG/2ML IJ SOLN
1.0000 mg | INTRAMUSCULAR | Status: AC
  Administered 2016-10-26: 2 mg via INTRAVENOUS

## 2016-10-26 MED ORDER — TETRACAINE HCL 0.5 % OP SOLN
1.0000 [drp] | OPHTHALMIC | Status: AC
  Administered 2016-10-26 (×3): 1 [drp] via OPHTHALMIC

## 2016-10-26 MED ORDER — LIDOCAINE HCL (PF) 1 % IJ SOLN
INTRAMUSCULAR | Status: DC | PRN
  Administered 2016-10-26: .8 mL

## 2016-10-26 MED ORDER — LIDOCAINE HCL 3.5 % OP GEL
1.0000 "application " | Freq: Once | OPHTHALMIC | Status: AC
  Administered 2016-10-26: 1 via OPHTHALMIC

## 2016-10-26 MED ORDER — POVIDONE-IODINE 5 % OP SOLN
OPHTHALMIC | Status: DC | PRN
  Administered 2016-10-26: 1 via OPHTHALMIC

## 2016-10-26 MED ORDER — FENTANYL CITRATE (PF) 100 MCG/2ML IJ SOLN
25.0000 ug | Freq: Once | INTRAMUSCULAR | Status: AC
  Administered 2016-10-26: 25 ug via INTRAVENOUS

## 2016-10-26 MED ORDER — NEOMYCIN-POLYMYXIN-DEXAMETH 3.5-10000-0.1 OP SUSP
OPHTHALMIC | Status: DC | PRN
  Administered 2016-10-26: 2 [drp] via OPHTHALMIC

## 2016-10-26 MED ORDER — FENTANYL CITRATE (PF) 100 MCG/2ML IJ SOLN
INTRAMUSCULAR | Status: AC
  Filled 2016-10-26: qty 2

## 2016-10-26 MED ORDER — CYCLOPENTOLATE-PHENYLEPHRINE 0.2-1 % OP SOLN
1.0000 [drp] | OPHTHALMIC | Status: AC
  Administered 2016-10-26 (×3): 1 [drp] via OPHTHALMIC

## 2016-10-26 MED ORDER — BSS IO SOLN
INTRAOCULAR | Status: DC | PRN
  Administered 2016-10-26: 15 mL via INTRAOCULAR

## 2016-10-26 MED ORDER — SODIUM CHLORIDE 0.9 % IV SOLN
INTRAVENOUS | Status: DC
  Administered 2016-10-26: 500 mL via INTRAVENOUS

## 2016-10-26 MED ORDER — NA HYALUR & NA CHOND-NA HYALUR 0.55-0.5 ML IO KIT
PACK | INTRAOCULAR | Status: DC | PRN
  Administered 2016-10-26: 1 via OPHTHALMIC

## 2016-10-26 MED ORDER — MIDAZOLAM HCL 2 MG/2ML IJ SOLN
INTRAMUSCULAR | Status: AC
  Filled 2016-10-26: qty 2

## 2016-10-26 MED ORDER — EPINEPHRINE PF 1 MG/ML IJ SOLN
INTRAOCULAR | Status: DC | PRN
  Administered 2016-10-26: 500 mL

## 2016-10-26 MED ORDER — PHENYLEPHRINE HCL 2.5 % OP SOLN
1.0000 [drp] | OPHTHALMIC | Status: AC
  Administered 2016-10-26 (×3): 1 [drp] via OPHTHALMIC

## 2016-10-26 SURGICAL SUPPLY — 22 items
CAPSULAR TENSION RING-AMO (OPHTHALMIC RELATED) IMPLANT
CLOTH BEACON ORANGE TIMEOUT ST (SAFETY) ×1 IMPLANT
EYE SHIELD UNIVERSAL CLEAR (GAUZE/BANDAGES/DRESSINGS) ×1 IMPLANT
GLOVE BIOGEL PI IND STRL 6.5 (GLOVE) IMPLANT
GLOVE BIOGEL PI IND STRL 7.0 (GLOVE) IMPLANT
GLOVE BIOGEL PI INDICATOR 6.5 (GLOVE) ×1
GLOVE BIOGEL PI INDICATOR 7.0 (GLOVE) ×1
GLOVE EXAM NITRILE LRG STRL (GLOVE) ×1 IMPLANT
GLOVE EXAM NITRILE MD LF STRL (GLOVE) IMPLANT
KIT VITRECTOMY (OPHTHALMIC RELATED) IMPLANT
PAD ARMBOARD 7.5X6 YLW CONV (MISCELLANEOUS) ×1 IMPLANT
PROC W NO LENS (INTRAOCULAR LENS)
PROC W SPEC LENS (INTRAOCULAR LENS)
PROCESS W NO LENS (INTRAOCULAR LENS) IMPLANT
PROCESS W SPEC LENS (INTRAOCULAR LENS) IMPLANT
RETRACTOR IRIS SIGHTPATH (OPHTHALMIC RELATED) IMPLANT
RING MALYGIN (MISCELLANEOUS) IMPLANT
SIGHTPATH CAT PROC W REG LENS (Ophthalmic Related) ×2 IMPLANT
SYRINGE LUER LOK 1CC (MISCELLANEOUS) ×1 IMPLANT
TAPE TRANSPARENT 1/2IN (GAUZE/BANDAGES/DRESSINGS) ×1 IMPLANT
VISCOELASTIC ADDITIONAL (OPHTHALMIC RELATED) IMPLANT
WATER STERILE IRR 250ML POUR (IV SOLUTION) ×1 IMPLANT

## 2016-10-26 NOTE — Op Note (Signed)
Date of Admission: 10/26/2016  Date of Surgery: 10/26/2016  Pre-Op Dx: Cataract Right  Eye  Post-Op Dx: Senile Nuclear Cataract  Right  Eye,  Dx Code H25.11  Surgeon: Tonny Branch, M.D.  Assistants: None  Anesthesia: Topical with MAC  Indications: Painless, progressive loss of vision with compromise of daily activities.  Surgery: Cataract Extraction with Intraocular lens Implant Right Eye  Discription: The patient had dilating drops and viscous lidocaine placed into the Right eye in the pre-op holding area. After transfer to the operating room, a time out was performed. The patient was then prepped and draped. Beginning with a 69 degree blade a paracentesis port was made at the surgeon's 2 o'clock position. The anterior chamber was then filled with 1% non-preserved lidocaine. This was followed by filling the anterior chamber with Provisc.  A 2.76m keratome blade was used to make a clear corneal incision at the temporal limbus.  A bent cystatome needle was used to create a continuous tear capsulotomy. Hydrodissection was performed with balanced salt solution on a Fine canula. The lens nucleus was then removed using the phacoemulsification handpiece. Residual cortex was removed with the I&A handpiece. The anterior chamber and capsular bag were refilled with Provisc. A posterior chamber intraocular lens was placed into the capsular bag with it's injector. The implant was positioned with the Kuglan hook. The Provisc was then removed from the anterior chamber and capsular bag with the I&A handpiece. Stromal hydration of the main incision and paracentesis port was performed with BSS on a Fine canula. The wounds were tested for leak which was negative. The patient tolerated the procedure well. There were no operative complications. The patient was then transferred to the recovery room in stable condition.  Complications: None  Specimen: None  EBL: None  Prosthetic device: Abbott Technis, PCB00, power  28.0, SN [id number]

## 2016-10-26 NOTE — Anesthesia Postprocedure Evaluation (Signed)
Anesthesia Post Note  Patient: Sabrina Beck  Procedure(s) Performed: Procedure(s) (LRB): CATARACT EXTRACTION PHACO AND INTRAOCULAR LENS PLACEMENT (IOC) CDE= 11.91 (Right)  Patient location during evaluation: Short Stay Anesthesia Type: MAC Level of consciousness: awake and alert Pain management: pain level controlled Vital Signs Assessment: post-procedure vital signs reviewed and stable Respiratory status: spontaneous breathing Cardiovascular status: blood pressure returned to baseline and stable Postop Assessment: no signs of nausea or vomiting Anesthetic complications: no     Last Vitals:  Vitals:   10/26/16 0740 10/26/16 0745  BP: (!) 133/51 (!) 130/54  Resp: 11 19  Temp:      Last Pain: There were no vitals filed for this visit.               Jazzma Neidhardt

## 2016-10-26 NOTE — Discharge Instructions (Signed)

## 2016-10-26 NOTE — Anesthesia Preprocedure Evaluation (Signed)
Anesthesia Evaluation  Patient identified by MRN, date of birth, ID band Patient awake    Reviewed: Allergy & Precautions, H&P , NPO status , Patient's Chart, lab work & pertinent test results, reviewed documented beta blocker date and time   Airway Mallampati: II  TM Distance: >3 FB Neck ROM: Full    Dental  (+) Teeth Intact, Dental Advisory Given   Pulmonary shortness of breath, sleep apnea , former smoker,    breath sounds clear to auscultation       Cardiovascular hypertension, Pt. on home beta blockers and Pt. on medications +CHF  + dysrhythmias Atrial Fibrillation + Valvular Problems/Murmurs  Rhythm:Irregular Rate:Normal     Neuro/Psych  Headaches,    GI/Hepatic GERD  ,  Endo/Other    Renal/GU ESRF and DialysisRenal disease     Musculoskeletal   Abdominal   Peds  Hematology  (+) anemia ,   Anesthesia Other Findings   Reproductive/Obstetrics                             Anesthesia Physical Anesthesia Plan  ASA: III  Anesthesia Plan: MAC   Post-op Pain Management:    Induction: Intravenous  Airway Management Planned: Nasal Cannula  Additional Equipment:   Intra-op Plan:   Post-operative Plan:   Informed Consent: I have reviewed the patients History and Physical, chart, labs and discussed the procedure including the risks, benefits and alternatives for the proposed anesthesia with the patient or authorized representative who has indicated his/her understanding and acceptance.     Plan Discussed with:   Anesthesia Plan Comments:         Anesthesia Quick Evaluation

## 2016-10-26 NOTE — Consult Note (Signed)
I have reviewed the H&P, the patient was re-examined, and I have identified no interval changes in medical condition and plan of care since the history and physical of record  

## 2016-10-26 NOTE — Transfer of Care (Signed)
Immediate Anesthesia Transfer of Care Note  Patient: Sabrina Beck  Procedure(s) Performed: Procedure(s) with comments: CATARACT EXTRACTION PHACO AND INTRAOCULAR LENS PLACEMENT (IOC) CDE= 11.91 (Right) - right - pt knows to arrive at 6:30  Patient Location: Short Stay  Anesthesia Type:MAC  Level of Consciousness: awake  Airway & Oxygen Therapy: Patient Spontanous Breathing  Post-op Assessment: Report given to RN  Post vital signs: Reviewed  Last Vitals:  Vitals:   10/26/16 0740 10/26/16 0745  BP: (!) 133/51 (!) 130/54  Resp: 11 19  Temp:      Last Pain: There were no vitals filed for this visit.       Complications: No apparent anesthesia complications

## 2016-10-27 ENCOUNTER — Encounter (HOSPITAL_COMMUNITY): Payer: Self-pay | Admitting: Ophthalmology

## 2016-10-27 DIAGNOSIS — Z992 Dependence on renal dialysis: Secondary | ICD-10-CM | POA: Diagnosis not present

## 2016-10-27 DIAGNOSIS — N186 End stage renal disease: Secondary | ICD-10-CM | POA: Diagnosis not present

## 2016-10-30 ENCOUNTER — Encounter: Payer: Self-pay | Admitting: Ophthalmology

## 2016-10-30 DIAGNOSIS — N186 End stage renal disease: Secondary | ICD-10-CM | POA: Diagnosis not present

## 2016-10-30 DIAGNOSIS — Z992 Dependence on renal dialysis: Secondary | ICD-10-CM | POA: Diagnosis not present

## 2016-11-01 DIAGNOSIS — Z992 Dependence on renal dialysis: Secondary | ICD-10-CM | POA: Diagnosis not present

## 2016-11-01 DIAGNOSIS — N186 End stage renal disease: Secondary | ICD-10-CM | POA: Diagnosis not present

## 2016-11-02 DIAGNOSIS — H2512 Age-related nuclear cataract, left eye: Secondary | ICD-10-CM | POA: Diagnosis not present

## 2016-11-03 DIAGNOSIS — N186 End stage renal disease: Secondary | ICD-10-CM | POA: Diagnosis not present

## 2016-11-03 DIAGNOSIS — Z992 Dependence on renal dialysis: Secondary | ICD-10-CM | POA: Diagnosis not present

## 2016-11-04 DIAGNOSIS — Z992 Dependence on renal dialysis: Secondary | ICD-10-CM | POA: Diagnosis not present

## 2016-11-04 DIAGNOSIS — N186 End stage renal disease: Secondary | ICD-10-CM | POA: Diagnosis not present

## 2016-11-06 DIAGNOSIS — N186 End stage renal disease: Secondary | ICD-10-CM | POA: Diagnosis not present

## 2016-11-06 DIAGNOSIS — Z992 Dependence on renal dialysis: Secondary | ICD-10-CM | POA: Diagnosis not present

## 2016-11-08 DIAGNOSIS — Z992 Dependence on renal dialysis: Secondary | ICD-10-CM | POA: Diagnosis not present

## 2016-11-08 DIAGNOSIS — N186 End stage renal disease: Secondary | ICD-10-CM | POA: Diagnosis not present

## 2016-11-10 DIAGNOSIS — Z992 Dependence on renal dialysis: Secondary | ICD-10-CM | POA: Diagnosis not present

## 2016-11-10 DIAGNOSIS — N186 End stage renal disease: Secondary | ICD-10-CM | POA: Diagnosis not present

## 2016-11-13 DIAGNOSIS — Z992 Dependence on renal dialysis: Secondary | ICD-10-CM | POA: Diagnosis not present

## 2016-11-13 DIAGNOSIS — N186 End stage renal disease: Secondary | ICD-10-CM | POA: Diagnosis not present

## 2016-11-14 ENCOUNTER — Other Ambulatory Visit: Payer: Self-pay

## 2016-11-14 ENCOUNTER — Telehealth: Payer: Self-pay | Admitting: Pharmacist

## 2016-11-14 MED ORDER — WARFARIN SODIUM 5 MG PO TABS: ORAL_TABLET | ORAL | 1 refills | Status: DC

## 2016-11-14 MED ORDER — METOPROLOL TARTRATE 50 MG PO TABS: ORAL_TABLET | ORAL | 2 refills | Status: DC

## 2016-11-14 NOTE — Telephone Encounter (Signed)
Rx refill

## 2016-11-15 DIAGNOSIS — N186 End stage renal disease: Secondary | ICD-10-CM | POA: Diagnosis not present

## 2016-11-15 DIAGNOSIS — Z992 Dependence on renal dialysis: Secondary | ICD-10-CM | POA: Diagnosis not present

## 2016-11-17 DIAGNOSIS — Z992 Dependence on renal dialysis: Secondary | ICD-10-CM | POA: Diagnosis not present

## 2016-11-17 DIAGNOSIS — N186 End stage renal disease: Secondary | ICD-10-CM | POA: Diagnosis not present

## 2016-11-20 DIAGNOSIS — Z992 Dependence on renal dialysis: Secondary | ICD-10-CM | POA: Diagnosis not present

## 2016-11-20 DIAGNOSIS — N186 End stage renal disease: Secondary | ICD-10-CM | POA: Diagnosis not present

## 2016-11-22 DIAGNOSIS — Z992 Dependence on renal dialysis: Secondary | ICD-10-CM | POA: Diagnosis not present

## 2016-11-22 DIAGNOSIS — N186 End stage renal disease: Secondary | ICD-10-CM | POA: Diagnosis not present

## 2016-11-24 DIAGNOSIS — Z992 Dependence on renal dialysis: Secondary | ICD-10-CM | POA: Diagnosis not present

## 2016-11-24 DIAGNOSIS — N186 End stage renal disease: Secondary | ICD-10-CM | POA: Diagnosis not present

## 2016-11-27 ENCOUNTER — Encounter (HOSPITAL_COMMUNITY): Payer: Self-pay

## 2016-11-27 ENCOUNTER — Encounter (HOSPITAL_COMMUNITY)
Admission: RE | Admit: 2016-11-27 | Discharge: 2016-11-27 | Disposition: A | Discharge status: Home / Self Care | Payer: Medicare HMO | Source: Ambulatory Visit | Attending: Ophthalmology | Admitting: Ophthalmology

## 2016-11-27 DIAGNOSIS — N186 End stage renal disease: Secondary | ICD-10-CM | POA: Diagnosis not present

## 2016-11-27 DIAGNOSIS — Z992 Dependence on renal dialysis: Secondary | ICD-10-CM | POA: Diagnosis not present

## 2016-11-29 DIAGNOSIS — Z992 Dependence on renal dialysis: Secondary | ICD-10-CM | POA: Diagnosis not present

## 2016-11-29 DIAGNOSIS — N186 End stage renal disease: Secondary | ICD-10-CM | POA: Diagnosis not present

## 2016-11-30 ENCOUNTER — Encounter (HOSPITAL_COMMUNITY): Payer: Self-pay | Admitting: Ophthalmology

## 2016-11-30 ENCOUNTER — Ambulatory Visit (HOSPITAL_COMMUNITY): Payer: Medicare HMO | Admitting: Anesthesiology

## 2016-11-30 ENCOUNTER — Ambulatory Visit (HOSPITAL_COMMUNITY)
Admission: RE | Admit: 2016-11-30 | Discharge: 2016-11-30 | Disposition: A | Discharge status: Home / Self Care | Payer: Medicare HMO | Source: Ambulatory Visit | Attending: Ophthalmology | Admitting: Ophthalmology

## 2016-11-30 ENCOUNTER — Encounter (HOSPITAL_COMMUNITY)
Admission: RE | Disposition: A | Discharge status: Home / Self Care | Payer: Self-pay | Source: Ambulatory Visit | Attending: Ophthalmology

## 2016-11-30 DIAGNOSIS — R51 Headache: Secondary | ICD-10-CM | POA: Diagnosis not present

## 2016-11-30 DIAGNOSIS — H2512 Age-related nuclear cataract, left eye: Secondary | ICD-10-CM | POA: Insufficient documentation

## 2016-11-30 DIAGNOSIS — Z79899 Other long term (current) drug therapy: Secondary | ICD-10-CM | POA: Insufficient documentation

## 2016-11-30 DIAGNOSIS — I509 Heart failure, unspecified: Secondary | ICD-10-CM | POA: Diagnosis not present

## 2016-11-30 DIAGNOSIS — N186 End stage renal disease: Secondary | ICD-10-CM | POA: Insufficient documentation

## 2016-11-30 DIAGNOSIS — I110 Hypertensive heart disease with heart failure: Secondary | ICD-10-CM | POA: Diagnosis not present

## 2016-11-30 DIAGNOSIS — D649 Anemia, unspecified: Secondary | ICD-10-CM | POA: Diagnosis not present

## 2016-11-30 DIAGNOSIS — I4891 Unspecified atrial fibrillation: Secondary | ICD-10-CM | POA: Diagnosis not present

## 2016-11-30 DIAGNOSIS — K219 Gastro-esophageal reflux disease without esophagitis: Secondary | ICD-10-CM | POA: Diagnosis not present

## 2016-11-30 DIAGNOSIS — G4730 Sleep apnea, unspecified: Secondary | ICD-10-CM | POA: Diagnosis not present

## 2016-11-30 DIAGNOSIS — Z87891 Personal history of nicotine dependence: Secondary | ICD-10-CM | POA: Diagnosis not present

## 2016-11-30 HISTORY — PX: CATARACT EXTRACTION W/PHACO: SHX586

## 2016-11-30 LAB — POCT I-STAT, CHEM 8
BUN: 31 mg/dL — ABNORMAL HIGH (ref 6–20)
CALCIUM ION: 0.86 mmol/L — AB (ref 1.15–1.40)
CREATININE: 8.4 mg/dL — AB (ref 0.44–1.00)
Chloride: 104 mmol/L (ref 101–111)
GLUCOSE: 98 mg/dL (ref 65–99)
HEMATOCRIT: 31 % — AB (ref 36.0–46.0)
HEMOGLOBIN: 10.5 g/dL — AB (ref 12.0–15.0)
Potassium: 4.1 mmol/L (ref 3.5–5.1)
Sodium: 135 mmol/L (ref 135–145)
TCO2: 20 mmol/L (ref 0–100)

## 2016-11-30 SURGERY — PHACOEMULSIFICATION, CATARACT, WITH IOL INSERTION
Anesthesia: Monitor Anesthesia Care | Site: Eye | Laterality: Left

## 2016-11-30 MED ORDER — LIDOCAINE HCL (PF) 1 % IJ SOLN
INTRAMUSCULAR | Status: DC | PRN
  Administered 2016-11-30: .6 mL

## 2016-11-30 MED ORDER — LACTATED RINGERS IV SOLN: INTRAVENOUS | Status: DC

## 2016-11-30 MED ORDER — CYCLOPENTOLATE-PHENYLEPHRINE 0.2-1 % OP SOLN
1.0000 [drp] | OPHTHALMIC | Status: AC
  Administered 2016-11-30 (×3): 1 [drp] via OPHTHALMIC

## 2016-11-30 MED ORDER — NEOMYCIN-POLYMYXIN-DEXAMETH 3.5-10000-0.1 OP SUSP
OPHTHALMIC | Status: DC | PRN
  Administered 2016-11-30: 2 [drp] via OPHTHALMIC

## 2016-11-30 MED ORDER — FENTANYL CITRATE (PF) 100 MCG/2ML IJ SOLN: 25.0000 ug | Freq: Once | INTRAMUSCULAR | Status: DC

## 2016-11-30 MED ORDER — BSS IO SOLN
INTRAOCULAR | Status: DC | PRN
  Administered 2016-11-30: 15 mL via INTRAOCULAR

## 2016-11-30 MED ORDER — PHENYLEPHRINE HCL 2.5 % OP SOLN
1.0000 [drp] | OPHTHALMIC | Status: AC
  Administered 2016-11-30 (×3): 1 [drp] via OPHTHALMIC

## 2016-11-30 MED ORDER — TETRACAINE HCL 0.5 % OP SOLN
1.0000 [drp] | OPHTHALMIC | Status: AC
  Administered 2016-11-30 (×3): 1 [drp] via OPHTHALMIC

## 2016-11-30 MED ORDER — SODIUM CHLORIDE 0.9 % IV SOLN
INTRAVENOUS | Status: DC | PRN
  Administered 2016-11-30: 11:00:00 via INTRAVENOUS

## 2016-11-30 MED ORDER — BSS IO SOLN
INTRAOCULAR | Status: DC | PRN
  Administered 2016-11-30: 11:00:00

## 2016-11-30 MED ORDER — MIDAZOLAM HCL 2 MG/2ML IJ SOLN
1.0000 mg | INTRAMUSCULAR | Status: AC
  Administered 2016-11-30 (×2): 1 mg via INTRAVENOUS

## 2016-11-30 MED ORDER — LIDOCAINE HCL 3.5 % OP GEL
1.0000 "application " | Freq: Once | OPHTHALMIC | Status: AC
  Administered 2016-11-30: 1 via OPHTHALMIC

## 2016-11-30 MED ORDER — MIDAZOLAM HCL 2 MG/2ML IJ SOLN
INTRAMUSCULAR | Status: AC
  Filled 2016-11-30: qty 2

## 2016-11-30 MED ORDER — POVIDONE-IODINE 5 % OP SOLN
OPHTHALMIC | Status: DC | PRN
  Administered 2016-11-30: 1 via OPHTHALMIC

## 2016-11-30 MED ORDER — FENTANYL CITRATE (PF) 100 MCG/2ML IJ SOLN
INTRAMUSCULAR | Status: AC
  Filled 2016-11-30: qty 2

## 2016-11-30 MED ORDER — NA HYALUR & NA CHOND-NA HYALUR 0.55-0.5 ML IO KIT
PACK | INTRAOCULAR | Status: DC | PRN
  Administered 2016-11-30: 1 via OPHTHALMIC

## 2016-11-30 SURGICAL SUPPLY — 23 items
CAPSULAR TENSION RING-AMO (OPHTHALMIC RELATED) IMPLANT
CLOTH BEACON ORANGE TIMEOUT ST (SAFETY) ×1 IMPLANT
EYE SHIELD UNIVERSAL CLEAR (GAUZE/BANDAGES/DRESSINGS) ×1 IMPLANT
GLOVE BIOGEL PI IND STRL 6.5 (GLOVE) IMPLANT
GLOVE BIOGEL PI IND STRL 7.0 (GLOVE) IMPLANT
GLOVE BIOGEL PI IND STRL 8 (GLOVE) IMPLANT
GLOVE BIOGEL PI INDICATOR 6.5 (GLOVE) ×1
GLOVE BIOGEL PI INDICATOR 7.0 (GLOVE) ×1
GLOVE BIOGEL PI INDICATOR 8 (GLOVE) ×1
GLOVE EXAM NITRILE LRG STRL (GLOVE) IMPLANT
GLOVE EXAM NITRILE MD LF STRL (GLOVE) IMPLANT
KIT VITRECTOMY (OPHTHALMIC RELATED) IMPLANT
PAD ARMBOARD 7.5X6 YLW CONV (MISCELLANEOUS) ×1 IMPLANT
PROC W NO LENS (INTRAOCULAR LENS)
PROC W SPEC LENS (INTRAOCULAR LENS)
PROCESS W NO LENS (INTRAOCULAR LENS) IMPLANT
PROCESS W SPEC LENS (INTRAOCULAR LENS) IMPLANT
RETRACTOR IRIS SIGHTPATH (OPHTHALMIC RELATED) IMPLANT
RING MALYGIN (MISCELLANEOUS) IMPLANT
SIGHTPATH CAT PROC W REG LENS (Ophthalmic Related) ×2 IMPLANT
SYRINGE LUER LOK 1CC (MISCELLANEOUS) ×1 IMPLANT
VISCOELASTIC ADDITIONAL (OPHTHALMIC RELATED) IMPLANT
WATER STERILE IRR 250ML POUR (IV SOLUTION) ×1 IMPLANT

## 2016-11-30 NOTE — Transfer of Care (Signed)
Immediate Anesthesia Transfer of Care Note  Patient: Sabrina Beck  Procedure(s) Performed: Procedure(s) with comments: CATARACT EXTRACTION PHACO AND INTRAOCULAR LENS PLACEMENT (IOC) CDE - 7.92  (Left) - left  Patient Location: Short Stay  Anesthesia Type:MAC  Level of Consciousness: awake and patient cooperative  Airway & Oxygen Therapy: Patient Spontanous Breathing  Post-op Assessment: Report given to RN  Post vital signs: Reviewed and stable  Last Vitals:  Vitals:   11/30/16 1045 11/30/16 1048  BP: (!) 152/124   Pulse:  96  Resp:  20  Temp:  36.6 C    Last Pain:  Vitals:   11/30/16 1048  TempSrc: Oral         Complications: No apparent anesthesia complications

## 2016-11-30 NOTE — H&P (Signed)
I have reviewed the H&P, the patient was re-examined, and I have identified no interval changes in medical condition and plan of care since the history and physical of record  

## 2016-11-30 NOTE — Anesthesia Preprocedure Evaluation (Signed)
Anesthesia Evaluation  Patient identified by MRN, date of birth, ID band Patient awake    Reviewed: Allergy & Precautions, H&P , NPO status , Patient's Chart, lab work & pertinent test results, reviewed documented beta blocker date and time   Airway Mallampati: II  TM Distance: >3 FB Neck ROM: Full    Dental  (+) Teeth Intact, Dental Advisory Given   Pulmonary shortness of breath, sleep apnea , former smoker,    breath sounds clear to auscultation       Cardiovascular hypertension, Pt. on home beta blockers and Pt. on medications +CHF  + dysrhythmias Atrial Fibrillation + Valvular Problems/Murmurs  Rhythm:Irregular Rate:Normal     Neuro/Psych  Headaches,    GI/Hepatic GERD  ,  Endo/Other    Renal/GU ESRF and DialysisRenal disease     Musculoskeletal   Abdominal   Peds  Hematology  (+) anemia ,   Anesthesia Other Findings   Reproductive/Obstetrics                             Anesthesia Physical Anesthesia Plan  ASA: III  Anesthesia Plan: MAC   Post-op Pain Management:    Induction: Intravenous  Airway Management Planned: Nasal Cannula  Additional Equipment:   Intra-op Plan:   Post-operative Plan:   Informed Consent: I have reviewed the patients History and Physical, chart, labs and discussed the procedure including the risks, benefits and alternatives for the proposed anesthesia with the patient or authorized representative who has indicated his/her understanding and acceptance.     Plan Discussed with:   Anesthesia Plan Comments:         Anesthesia Quick Evaluation

## 2016-11-30 NOTE — Anesthesia Postprocedure Evaluation (Signed)
Anesthesia Post Note  Patient: ANUM PALECEK  Procedure(s) Performed: Procedure(s) (LRB): CATARACT EXTRACTION PHACO AND INTRAOCULAR LENS PLACEMENT (IOC) CDE - 7.92  (Left)  Patient location during evaluation: Short Stay Anesthesia Type: MAC Level of consciousness: awake and alert, oriented and patient cooperative Pain management: pain level controlled Vital Signs Assessment: post-procedure vital signs reviewed and stable Respiratory status: spontaneous breathing, nonlabored ventilation and respiratory function stable Cardiovascular status: blood pressure returned to baseline Postop Assessment: no signs of nausea or vomiting Anesthetic complications: no     Last Vitals:  Vitals:   11/30/16 1045 11/30/16 1048  BP: (!) 152/124   Pulse:  96  Resp:  20  Temp:  36.6 C    Last Pain:  Vitals:   11/30/16 1048  TempSrc: Oral                 Chianna Spirito J

## 2016-11-30 NOTE — Op Note (Signed)
Date of Admission: 11/30/2016  Date of Surgery: 11/30/2016  Pre-Op Dx: Cataract Left  Eye  Post-Op Dx: Senile Nuclear Cataract  Left  Eye,  Dx Code H25.12  Surgeon: Tonny Branch, M.D.  Assistants: None  Anesthesia: Topical with MAC  Indications: Painless, progressive loss of vision with compromise of daily activities.  Surgery: Cataract Extraction with Intraocular lens Implant Left Eye  Discription: The patient had dilating drops and viscous lidocaine placed into the Left eye in the pre-op holding area. After transfer to the operating room, a time out was performed. The patient was then prepped and draped. Beginning with a 72 degree blade a paracentesis port was made at the surgeon's 2 o'clock position. The anterior chamber was then filled with 1% non-preserved lidocaine. This was followed by filling the anterior chamber with Provisc.  A 2.51m keratome blade was used to make a clear corneal incision at the temporal limbus.  A bent cystatome needle was used to create a continuous tear capsulotomy. Hydrodissection was performed with balanced salt solution on a Fine canula. The lens nucleus was then removed using the phacoemulsification handpiece. Residual cortex was removed with the I&A handpiece. The anterior chamber and capsular bag were refilled with Provisc. A posterior chamber intraocular lens was placed into the capsular bag with it's injector. The implant was positioned with the Kuglan hook. The Provisc was then removed from the anterior chamber and capsular bag with the I&A handpiece. Stromal hydration of the main incision and paracentesis port was performed with BSS on a Fine canula. The wounds were tested for leak which was negative. The patient tolerated the procedure well. There were no operative complications. The patient was then transferred to the recovery room in stable condition.  Complications: None  Specimen: None  EBL: None  Prosthetic device: Abbott Technis, PCB00, power  27.5, SN [id number]

## 2016-12-01 DIAGNOSIS — Z992 Dependence on renal dialysis: Secondary | ICD-10-CM | POA: Diagnosis not present

## 2016-12-01 DIAGNOSIS — N186 End stage renal disease: Secondary | ICD-10-CM | POA: Diagnosis not present

## 2016-12-04 ENCOUNTER — Encounter (HOSPITAL_COMMUNITY): Payer: Self-pay | Admitting: Ophthalmology

## 2016-12-04 ENCOUNTER — Ambulatory Visit (INDEPENDENT_AMBULATORY_CARE_PROVIDER_SITE_OTHER): Payer: Medicare HMO | Admitting: *Deleted

## 2016-12-04 DIAGNOSIS — Z992 Dependence on renal dialysis: Secondary | ICD-10-CM | POA: Diagnosis not present

## 2016-12-04 DIAGNOSIS — Z5181 Encounter for therapeutic drug level monitoring: Secondary | ICD-10-CM

## 2016-12-04 DIAGNOSIS — I4891 Unspecified atrial fibrillation: Secondary | ICD-10-CM

## 2016-12-04 DIAGNOSIS — N186 End stage renal disease: Secondary | ICD-10-CM | POA: Diagnosis not present

## 2016-12-04 LAB — POCT INR: INR: 2.7

## 2016-12-06 DIAGNOSIS — N186 End stage renal disease: Secondary | ICD-10-CM | POA: Diagnosis not present

## 2016-12-06 DIAGNOSIS — Z992 Dependence on renal dialysis: Secondary | ICD-10-CM | POA: Diagnosis not present

## 2016-12-08 DIAGNOSIS — Z992 Dependence on renal dialysis: Secondary | ICD-10-CM | POA: Diagnosis not present

## 2016-12-08 DIAGNOSIS — N186 End stage renal disease: Secondary | ICD-10-CM | POA: Diagnosis not present

## 2016-12-11 DIAGNOSIS — Z992 Dependence on renal dialysis: Secondary | ICD-10-CM | POA: Diagnosis not present

## 2016-12-11 DIAGNOSIS — N186 End stage renal disease: Secondary | ICD-10-CM | POA: Diagnosis not present

## 2016-12-13 ENCOUNTER — Telehealth: Payer: Self-pay | Admitting: Cardiovascular Disease

## 2016-12-13 ENCOUNTER — Encounter: Payer: Self-pay | Admitting: Cardiovascular Disease

## 2016-12-13 DIAGNOSIS — Z992 Dependence on renal dialysis: Secondary | ICD-10-CM | POA: Diagnosis not present

## 2016-12-13 DIAGNOSIS — N186 End stage renal disease: Secondary | ICD-10-CM | POA: Diagnosis not present

## 2016-12-13 NOTE — Telephone Encounter (Signed)
New message      What dental office are you calling from?  Dr Knox--peridontitis What is your office phone and fax number?  Fax [phone] What type of procedure is the patient having performed?  Biopsy of her gums 1. What date is procedure scheduled?  01-10-17  What is your question (ex. Antibiotics prior to procedure, holding medication-we need to know how long dentist wants pt to hold med)?  Hold coumadin prior to biopsy?  If yes, hold for how many days prior?

## 2016-12-13 NOTE — Telephone Encounter (Signed)
Dr. Geralynn Ochs needs to know if coumadin should be held and if so how many days prior to the biopsy. Fax number is [phone].

## 2016-12-13 NOTE — Telephone Encounter (Signed)
epicd 

## 2016-12-15 DIAGNOSIS — N186 End stage renal disease: Secondary | ICD-10-CM | POA: Diagnosis not present

## 2016-12-15 DIAGNOSIS — Z992 Dependence on renal dialysis: Secondary | ICD-10-CM | POA: Diagnosis not present

## 2016-12-15 NOTE — Telephone Encounter (Signed)
Faxed to Carolyn's attention via epic.

## 2016-12-18 DIAGNOSIS — N186 End stage renal disease: Secondary | ICD-10-CM | POA: Diagnosis not present

## 2016-12-18 DIAGNOSIS — Z992 Dependence on renal dialysis: Secondary | ICD-10-CM | POA: Diagnosis not present

## 2016-12-20 DIAGNOSIS — Z992 Dependence on renal dialysis: Secondary | ICD-10-CM | POA: Diagnosis not present

## 2016-12-20 DIAGNOSIS — N186 End stage renal disease: Secondary | ICD-10-CM | POA: Diagnosis not present

## 2016-12-22 DIAGNOSIS — N186 End stage renal disease: Secondary | ICD-10-CM | POA: Diagnosis not present

## 2016-12-22 DIAGNOSIS — Z992 Dependence on renal dialysis: Secondary | ICD-10-CM | POA: Diagnosis not present

## 2016-12-25 DIAGNOSIS — Z992 Dependence on renal dialysis: Secondary | ICD-10-CM | POA: Diagnosis not present

## 2016-12-25 DIAGNOSIS — N186 End stage renal disease: Secondary | ICD-10-CM | POA: Diagnosis not present

## 2016-12-27 DIAGNOSIS — Z0100 Encounter for examination of eyes and vision without abnormal findings: Secondary | ICD-10-CM | POA: Diagnosis not present

## 2016-12-27 DIAGNOSIS — N186 End stage renal disease: Secondary | ICD-10-CM | POA: Diagnosis not present

## 2016-12-27 DIAGNOSIS — Z992 Dependence on renal dialysis: Secondary | ICD-10-CM | POA: Diagnosis not present

## 2016-12-29 DIAGNOSIS — N186 End stage renal disease: Secondary | ICD-10-CM | POA: Diagnosis not present

## 2016-12-29 DIAGNOSIS — Z992 Dependence on renal dialysis: Secondary | ICD-10-CM | POA: Diagnosis not present

## 2017-01-01 DIAGNOSIS — N186 End stage renal disease: Secondary | ICD-10-CM | POA: Diagnosis not present

## 2017-01-01 DIAGNOSIS — Z992 Dependence on renal dialysis: Secondary | ICD-10-CM | POA: Diagnosis not present

## 2017-01-03 DIAGNOSIS — N186 End stage renal disease: Secondary | ICD-10-CM | POA: Diagnosis not present

## 2017-01-03 DIAGNOSIS — Z992 Dependence on renal dialysis: Secondary | ICD-10-CM | POA: Diagnosis not present

## 2017-01-04 DIAGNOSIS — Z992 Dependence on renal dialysis: Secondary | ICD-10-CM | POA: Diagnosis not present

## 2017-01-04 DIAGNOSIS — D225 Melanocytic nevi of trunk: Secondary | ICD-10-CM | POA: Diagnosis not present

## 2017-01-04 DIAGNOSIS — N186 End stage renal disease: Secondary | ICD-10-CM | POA: Diagnosis not present

## 2017-01-04 DIAGNOSIS — C44112 Basal cell carcinoma of skin of right eyelid, including canthus: Secondary | ICD-10-CM | POA: Diagnosis not present

## 2017-01-05 DIAGNOSIS — N186 End stage renal disease: Secondary | ICD-10-CM | POA: Diagnosis not present

## 2017-01-05 DIAGNOSIS — Z992 Dependence on renal dialysis: Secondary | ICD-10-CM | POA: Diagnosis not present

## 2017-01-08 DIAGNOSIS — N186 End stage renal disease: Secondary | ICD-10-CM | POA: Diagnosis not present

## 2017-01-08 DIAGNOSIS — Z992 Dependence on renal dialysis: Secondary | ICD-10-CM | POA: Diagnosis not present

## 2017-01-10 DIAGNOSIS — N186 End stage renal disease: Secondary | ICD-10-CM | POA: Diagnosis not present

## 2017-01-10 DIAGNOSIS — Z992 Dependence on renal dialysis: Secondary | ICD-10-CM | POA: Diagnosis not present

## 2017-01-12 DIAGNOSIS — Z992 Dependence on renal dialysis: Secondary | ICD-10-CM | POA: Diagnosis not present

## 2017-01-12 DIAGNOSIS — N186 End stage renal disease: Secondary | ICD-10-CM | POA: Diagnosis not present

## 2017-01-15 ENCOUNTER — Ambulatory Visit (INDEPENDENT_AMBULATORY_CARE_PROVIDER_SITE_OTHER): Payer: Medicare HMO | Admitting: *Deleted

## 2017-01-15 DIAGNOSIS — I4891 Unspecified atrial fibrillation: Secondary | ICD-10-CM | POA: Diagnosis not present

## 2017-01-15 DIAGNOSIS — Z5181 Encounter for therapeutic drug level monitoring: Secondary | ICD-10-CM | POA: Diagnosis not present

## 2017-01-15 DIAGNOSIS — N186 End stage renal disease: Secondary | ICD-10-CM | POA: Diagnosis not present

## 2017-01-15 DIAGNOSIS — Z992 Dependence on renal dialysis: Secondary | ICD-10-CM | POA: Diagnosis not present

## 2017-01-15 LAB — POCT INR: INR: 1.6

## 2017-01-17 DIAGNOSIS — Z992 Dependence on renal dialysis: Secondary | ICD-10-CM | POA: Diagnosis not present

## 2017-01-17 DIAGNOSIS — N186 End stage renal disease: Secondary | ICD-10-CM | POA: Diagnosis not present

## 2017-01-17 DIAGNOSIS — K136 Irritative hyperplasia of oral mucosa: Secondary | ICD-10-CM | POA: Diagnosis not present

## 2017-01-18 DIAGNOSIS — C44311 Basal cell carcinoma of skin of nose: Secondary | ICD-10-CM | POA: Diagnosis not present

## 2017-01-18 DIAGNOSIS — Z08 Encounter for follow-up examination after completed treatment for malignant neoplasm: Secondary | ICD-10-CM | POA: Diagnosis not present

## 2017-01-18 DIAGNOSIS — Z85828 Personal history of other malignant neoplasm of skin: Secondary | ICD-10-CM | POA: Diagnosis not present

## 2017-01-19 DIAGNOSIS — Z992 Dependence on renal dialysis: Secondary | ICD-10-CM | POA: Diagnosis not present

## 2017-01-19 DIAGNOSIS — N186 End stage renal disease: Secondary | ICD-10-CM | POA: Diagnosis not present

## 2017-01-22 DIAGNOSIS — N186 End stage renal disease: Secondary | ICD-10-CM | POA: Diagnosis not present

## 2017-01-22 DIAGNOSIS — Z992 Dependence on renal dialysis: Secondary | ICD-10-CM | POA: Diagnosis not present

## 2017-01-24 DIAGNOSIS — N186 End stage renal disease: Secondary | ICD-10-CM | POA: Diagnosis not present

## 2017-01-24 DIAGNOSIS — Z992 Dependence on renal dialysis: Secondary | ICD-10-CM | POA: Diagnosis not present

## 2017-01-26 DIAGNOSIS — Z992 Dependence on renal dialysis: Secondary | ICD-10-CM | POA: Diagnosis not present

## 2017-01-26 DIAGNOSIS — N186 End stage renal disease: Secondary | ICD-10-CM | POA: Diagnosis not present

## 2017-01-29 ENCOUNTER — Ambulatory Visit (INDEPENDENT_AMBULATORY_CARE_PROVIDER_SITE_OTHER): Payer: Medicare HMO | Admitting: *Deleted

## 2017-01-29 DIAGNOSIS — Z5181 Encounter for therapeutic drug level monitoring: Secondary | ICD-10-CM

## 2017-01-29 DIAGNOSIS — I4891 Unspecified atrial fibrillation: Secondary | ICD-10-CM

## 2017-01-29 DIAGNOSIS — Z992 Dependence on renal dialysis: Secondary | ICD-10-CM | POA: Diagnosis not present

## 2017-01-29 DIAGNOSIS — N186 End stage renal disease: Secondary | ICD-10-CM | POA: Diagnosis not present

## 2017-01-29 LAB — POCT INR: INR: 2.8

## 2017-01-31 DIAGNOSIS — Z992 Dependence on renal dialysis: Secondary | ICD-10-CM | POA: Diagnosis not present

## 2017-01-31 DIAGNOSIS — N186 End stage renal disease: Secondary | ICD-10-CM | POA: Diagnosis not present

## 2017-02-02 DIAGNOSIS — Z992 Dependence on renal dialysis: Secondary | ICD-10-CM | POA: Diagnosis not present

## 2017-02-02 DIAGNOSIS — N186 End stage renal disease: Secondary | ICD-10-CM | POA: Diagnosis not present

## 2017-02-03 DIAGNOSIS — Z992 Dependence on renal dialysis: Secondary | ICD-10-CM | POA: Diagnosis not present

## 2017-02-03 DIAGNOSIS — N186 End stage renal disease: Secondary | ICD-10-CM | POA: Diagnosis not present

## 2017-02-05 DIAGNOSIS — N186 End stage renal disease: Secondary | ICD-10-CM | POA: Diagnosis not present

## 2017-02-05 DIAGNOSIS — Z992 Dependence on renal dialysis: Secondary | ICD-10-CM | POA: Diagnosis not present

## 2017-02-07 DIAGNOSIS — N186 End stage renal disease: Secondary | ICD-10-CM | POA: Diagnosis not present

## 2017-02-07 DIAGNOSIS — Z992 Dependence on renal dialysis: Secondary | ICD-10-CM | POA: Diagnosis not present

## 2017-02-09 DIAGNOSIS — Z992 Dependence on renal dialysis: Secondary | ICD-10-CM | POA: Diagnosis not present

## 2017-02-09 DIAGNOSIS — N186 End stage renal disease: Secondary | ICD-10-CM | POA: Diagnosis not present

## 2017-02-12 DIAGNOSIS — Z992 Dependence on renal dialysis: Secondary | ICD-10-CM | POA: Diagnosis not present

## 2017-02-12 DIAGNOSIS — N186 End stage renal disease: Secondary | ICD-10-CM | POA: Diagnosis not present

## 2017-02-14 DIAGNOSIS — N186 End stage renal disease: Secondary | ICD-10-CM | POA: Diagnosis not present

## 2017-02-14 DIAGNOSIS — Z992 Dependence on renal dialysis: Secondary | ICD-10-CM | POA: Diagnosis not present

## 2017-02-15 DIAGNOSIS — Z08 Encounter for follow-up examination after completed treatment for malignant neoplasm: Secondary | ICD-10-CM | POA: Diagnosis not present

## 2017-02-15 DIAGNOSIS — L570 Actinic keratosis: Secondary | ICD-10-CM | POA: Diagnosis not present

## 2017-02-15 DIAGNOSIS — X32XXXD Exposure to sunlight, subsequent encounter: Secondary | ICD-10-CM | POA: Diagnosis not present

## 2017-02-15 DIAGNOSIS — Z85828 Personal history of other malignant neoplasm of skin: Secondary | ICD-10-CM | POA: Diagnosis not present

## 2017-02-16 DIAGNOSIS — Z992 Dependence on renal dialysis: Secondary | ICD-10-CM | POA: Diagnosis not present

## 2017-02-16 DIAGNOSIS — N186 End stage renal disease: Secondary | ICD-10-CM | POA: Diagnosis not present

## 2017-02-19 DIAGNOSIS — Z992 Dependence on renal dialysis: Secondary | ICD-10-CM | POA: Diagnosis not present

## 2017-02-19 DIAGNOSIS — N186 End stage renal disease: Secondary | ICD-10-CM | POA: Diagnosis not present

## 2017-02-21 DIAGNOSIS — N186 End stage renal disease: Secondary | ICD-10-CM | POA: Diagnosis not present

## 2017-02-21 DIAGNOSIS — Z992 Dependence on renal dialysis: Secondary | ICD-10-CM | POA: Diagnosis not present

## 2017-02-23 DIAGNOSIS — N186 End stage renal disease: Secondary | ICD-10-CM | POA: Diagnosis not present

## 2017-02-23 DIAGNOSIS — Z992 Dependence on renal dialysis: Secondary | ICD-10-CM | POA: Diagnosis not present

## 2017-02-26 ENCOUNTER — Ambulatory Visit (INDEPENDENT_AMBULATORY_CARE_PROVIDER_SITE_OTHER): Payer: Medicare HMO | Admitting: *Deleted

## 2017-02-26 DIAGNOSIS — N186 End stage renal disease: Secondary | ICD-10-CM | POA: Diagnosis not present

## 2017-02-26 DIAGNOSIS — Z5181 Encounter for therapeutic drug level monitoring: Secondary | ICD-10-CM

## 2017-02-26 DIAGNOSIS — Z992 Dependence on renal dialysis: Secondary | ICD-10-CM | POA: Diagnosis not present

## 2017-02-26 DIAGNOSIS — I4891 Unspecified atrial fibrillation: Secondary | ICD-10-CM

## 2017-02-26 LAB — POCT INR: INR: 2

## 2017-02-28 DIAGNOSIS — Z992 Dependence on renal dialysis: Secondary | ICD-10-CM | POA: Diagnosis not present

## 2017-02-28 DIAGNOSIS — N186 End stage renal disease: Secondary | ICD-10-CM | POA: Diagnosis not present

## 2017-03-02 DIAGNOSIS — N186 End stage renal disease: Secondary | ICD-10-CM | POA: Diagnosis not present

## 2017-03-02 DIAGNOSIS — Z992 Dependence on renal dialysis: Secondary | ICD-10-CM | POA: Diagnosis not present

## 2017-03-05 DIAGNOSIS — Z992 Dependence on renal dialysis: Secondary | ICD-10-CM | POA: Diagnosis not present

## 2017-03-05 DIAGNOSIS — N186 End stage renal disease: Secondary | ICD-10-CM | POA: Diagnosis not present

## 2017-03-06 DIAGNOSIS — Z992 Dependence on renal dialysis: Secondary | ICD-10-CM | POA: Diagnosis not present

## 2017-03-06 DIAGNOSIS — N186 End stage renal disease: Secondary | ICD-10-CM | POA: Diagnosis not present

## 2017-03-07 DIAGNOSIS — N186 End stage renal disease: Secondary | ICD-10-CM | POA: Diagnosis not present

## 2017-03-07 DIAGNOSIS — Z992 Dependence on renal dialysis: Secondary | ICD-10-CM | POA: Diagnosis not present

## 2017-03-09 DIAGNOSIS — Z992 Dependence on renal dialysis: Secondary | ICD-10-CM | POA: Diagnosis not present

## 2017-03-09 DIAGNOSIS — N186 End stage renal disease: Secondary | ICD-10-CM | POA: Diagnosis not present

## 2017-03-12 DIAGNOSIS — N186 End stage renal disease: Secondary | ICD-10-CM | POA: Diagnosis not present

## 2017-03-12 DIAGNOSIS — Z992 Dependence on renal dialysis: Secondary | ICD-10-CM | POA: Diagnosis not present

## 2017-03-14 DIAGNOSIS — N186 End stage renal disease: Secondary | ICD-10-CM | POA: Diagnosis not present

## 2017-03-14 DIAGNOSIS — Z992 Dependence on renal dialysis: Secondary | ICD-10-CM | POA: Diagnosis not present

## 2017-03-16 DIAGNOSIS — Z992 Dependence on renal dialysis: Secondary | ICD-10-CM | POA: Diagnosis not present

## 2017-03-16 DIAGNOSIS — N186 End stage renal disease: Secondary | ICD-10-CM | POA: Diagnosis not present

## 2017-03-19 DIAGNOSIS — Z992 Dependence on renal dialysis: Secondary | ICD-10-CM | POA: Diagnosis not present

## 2017-03-19 DIAGNOSIS — N186 End stage renal disease: Secondary | ICD-10-CM | POA: Diagnosis not present

## 2017-03-21 DIAGNOSIS — N186 End stage renal disease: Secondary | ICD-10-CM | POA: Diagnosis not present

## 2017-03-21 DIAGNOSIS — Z992 Dependence on renal dialysis: Secondary | ICD-10-CM | POA: Diagnosis not present

## 2017-03-23 DIAGNOSIS — N186 End stage renal disease: Secondary | ICD-10-CM | POA: Diagnosis not present

## 2017-03-23 DIAGNOSIS — Z992 Dependence on renal dialysis: Secondary | ICD-10-CM | POA: Diagnosis not present

## 2017-03-26 ENCOUNTER — Ambulatory Visit (INDEPENDENT_AMBULATORY_CARE_PROVIDER_SITE_OTHER): Payer: Medicare HMO | Admitting: *Deleted

## 2017-03-26 DIAGNOSIS — N186 End stage renal disease: Secondary | ICD-10-CM | POA: Diagnosis not present

## 2017-03-26 DIAGNOSIS — Z992 Dependence on renal dialysis: Secondary | ICD-10-CM | POA: Diagnosis not present

## 2017-03-26 DIAGNOSIS — Z5181 Encounter for therapeutic drug level monitoring: Secondary | ICD-10-CM

## 2017-03-26 DIAGNOSIS — I4891 Unspecified atrial fibrillation: Secondary | ICD-10-CM

## 2017-03-26 LAB — POCT INR: INR: 2.3

## 2017-03-28 ENCOUNTER — Other Ambulatory Visit: Payer: Self-pay | Admitting: Cardiovascular Disease

## 2017-03-28 DIAGNOSIS — M13871 Other specified arthritis, right ankle and foot: Secondary | ICD-10-CM | POA: Diagnosis not present

## 2017-03-28 DIAGNOSIS — I10 Essential (primary) hypertension: Secondary | ICD-10-CM | POA: Diagnosis not present

## 2017-03-28 DIAGNOSIS — I4891 Unspecified atrial fibrillation: Secondary | ICD-10-CM | POA: Diagnosis not present

## 2017-03-28 DIAGNOSIS — Z6841 Body Mass Index (BMI) 40.0 and over, adult: Secondary | ICD-10-CM | POA: Diagnosis not present

## 2017-03-28 DIAGNOSIS — R7309 Other abnormal glucose: Secondary | ICD-10-CM | POA: Diagnosis not present

## 2017-03-28 DIAGNOSIS — I2510 Atherosclerotic heart disease of native coronary artery without angina pectoris: Secondary | ICD-10-CM | POA: Diagnosis not present

## 2017-03-28 DIAGNOSIS — Z992 Dependence on renal dialysis: Secondary | ICD-10-CM | POA: Diagnosis not present

## 2017-03-28 DIAGNOSIS — N189 Chronic kidney disease, unspecified: Secondary | ICD-10-CM | POA: Diagnosis not present

## 2017-03-28 DIAGNOSIS — I5032 Chronic diastolic (congestive) heart failure: Secondary | ICD-10-CM | POA: Diagnosis not present

## 2017-03-28 DIAGNOSIS — N186 End stage renal disease: Secondary | ICD-10-CM | POA: Diagnosis not present

## 2017-03-28 DIAGNOSIS — E785 Hyperlipidemia, unspecified: Secondary | ICD-10-CM | POA: Diagnosis not present

## 2017-03-30 DIAGNOSIS — N186 End stage renal disease: Secondary | ICD-10-CM | POA: Diagnosis not present

## 2017-03-30 DIAGNOSIS — Z992 Dependence on renal dialysis: Secondary | ICD-10-CM | POA: Diagnosis not present

## 2017-04-02 DIAGNOSIS — Z992 Dependence on renal dialysis: Secondary | ICD-10-CM | POA: Diagnosis not present

## 2017-04-02 DIAGNOSIS — N186 End stage renal disease: Secondary | ICD-10-CM | POA: Diagnosis not present

## 2017-04-04 DIAGNOSIS — N186 End stage renal disease: Secondary | ICD-10-CM | POA: Diagnosis not present

## 2017-04-04 DIAGNOSIS — Z992 Dependence on renal dialysis: Secondary | ICD-10-CM | POA: Diagnosis not present

## 2017-04-06 DIAGNOSIS — Z992 Dependence on renal dialysis: Secondary | ICD-10-CM | POA: Diagnosis not present

## 2017-04-06 DIAGNOSIS — N186 End stage renal disease: Secondary | ICD-10-CM | POA: Diagnosis not present

## 2017-04-09 DIAGNOSIS — N186 End stage renal disease: Secondary | ICD-10-CM | POA: Diagnosis not present

## 2017-04-09 DIAGNOSIS — Z992 Dependence on renal dialysis: Secondary | ICD-10-CM | POA: Diagnosis not present

## 2017-04-11 DIAGNOSIS — Z992 Dependence on renal dialysis: Secondary | ICD-10-CM | POA: Diagnosis not present

## 2017-04-11 DIAGNOSIS — N186 End stage renal disease: Secondary | ICD-10-CM | POA: Diagnosis not present

## 2017-04-13 DIAGNOSIS — N186 End stage renal disease: Secondary | ICD-10-CM | POA: Diagnosis not present

## 2017-04-13 DIAGNOSIS — Z992 Dependence on renal dialysis: Secondary | ICD-10-CM | POA: Diagnosis not present

## 2017-04-16 DIAGNOSIS — N186 End stage renal disease: Secondary | ICD-10-CM | POA: Diagnosis not present

## 2017-04-16 DIAGNOSIS — Z992 Dependence on renal dialysis: Secondary | ICD-10-CM | POA: Diagnosis not present

## 2017-04-18 DIAGNOSIS — N186 End stage renal disease: Secondary | ICD-10-CM | POA: Diagnosis not present

## 2017-04-18 DIAGNOSIS — Z992 Dependence on renal dialysis: Secondary | ICD-10-CM | POA: Diagnosis not present

## 2017-04-20 DIAGNOSIS — N186 End stage renal disease: Secondary | ICD-10-CM | POA: Diagnosis not present

## 2017-04-20 DIAGNOSIS — Z992 Dependence on renal dialysis: Secondary | ICD-10-CM | POA: Diagnosis not present

## 2017-04-23 DIAGNOSIS — N186 End stage renal disease: Secondary | ICD-10-CM | POA: Diagnosis not present

## 2017-04-23 DIAGNOSIS — Z992 Dependence on renal dialysis: Secondary | ICD-10-CM | POA: Diagnosis not present

## 2017-04-25 DIAGNOSIS — N186 End stage renal disease: Secondary | ICD-10-CM | POA: Diagnosis not present

## 2017-04-25 DIAGNOSIS — Z992 Dependence on renal dialysis: Secondary | ICD-10-CM | POA: Diagnosis not present

## 2017-04-27 DIAGNOSIS — N186 End stage renal disease: Secondary | ICD-10-CM | POA: Diagnosis not present

## 2017-04-27 DIAGNOSIS — Z992 Dependence on renal dialysis: Secondary | ICD-10-CM | POA: Diagnosis not present

## 2017-04-30 DIAGNOSIS — N186 End stage renal disease: Secondary | ICD-10-CM | POA: Diagnosis not present

## 2017-04-30 DIAGNOSIS — Z992 Dependence on renal dialysis: Secondary | ICD-10-CM | POA: Diagnosis not present

## 2017-05-02 DIAGNOSIS — N186 End stage renal disease: Secondary | ICD-10-CM | POA: Diagnosis not present

## 2017-05-02 DIAGNOSIS — Z992 Dependence on renal dialysis: Secondary | ICD-10-CM | POA: Diagnosis not present

## 2017-05-04 DIAGNOSIS — Z992 Dependence on renal dialysis: Secondary | ICD-10-CM | POA: Diagnosis not present

## 2017-05-04 DIAGNOSIS — N186 End stage renal disease: Secondary | ICD-10-CM | POA: Diagnosis not present

## 2017-05-06 DIAGNOSIS — Z992 Dependence on renal dialysis: Secondary | ICD-10-CM | POA: Diagnosis not present

## 2017-05-06 DIAGNOSIS — N186 End stage renal disease: Secondary | ICD-10-CM | POA: Diagnosis not present

## 2017-05-07 ENCOUNTER — Ambulatory Visit (INDEPENDENT_AMBULATORY_CARE_PROVIDER_SITE_OTHER): Payer: Medicare HMO | Admitting: *Deleted

## 2017-05-07 DIAGNOSIS — Z5181 Encounter for therapeutic drug level monitoring: Secondary | ICD-10-CM | POA: Diagnosis not present

## 2017-05-07 DIAGNOSIS — I4891 Unspecified atrial fibrillation: Secondary | ICD-10-CM | POA: Diagnosis not present

## 2017-05-07 DIAGNOSIS — Z992 Dependence on renal dialysis: Secondary | ICD-10-CM | POA: Diagnosis not present

## 2017-05-07 DIAGNOSIS — N186 End stage renal disease: Secondary | ICD-10-CM | POA: Diagnosis not present

## 2017-05-07 LAB — POCT INR: INR: 3.2

## 2017-05-09 DIAGNOSIS — Z992 Dependence on renal dialysis: Secondary | ICD-10-CM | POA: Diagnosis not present

## 2017-05-09 DIAGNOSIS — N186 End stage renal disease: Secondary | ICD-10-CM | POA: Diagnosis not present

## 2017-05-11 DIAGNOSIS — N186 End stage renal disease: Secondary | ICD-10-CM | POA: Diagnosis not present

## 2017-05-11 DIAGNOSIS — Z992 Dependence on renal dialysis: Secondary | ICD-10-CM | POA: Diagnosis not present

## 2017-05-14 DIAGNOSIS — Z992 Dependence on renal dialysis: Secondary | ICD-10-CM | POA: Diagnosis not present

## 2017-05-14 DIAGNOSIS — N186 End stage renal disease: Secondary | ICD-10-CM | POA: Diagnosis not present

## 2017-05-16 DIAGNOSIS — Z992 Dependence on renal dialysis: Secondary | ICD-10-CM | POA: Diagnosis not present

## 2017-05-16 DIAGNOSIS — N186 End stage renal disease: Secondary | ICD-10-CM | POA: Diagnosis not present

## 2017-05-19 DIAGNOSIS — Z992 Dependence on renal dialysis: Secondary | ICD-10-CM | POA: Diagnosis not present

## 2017-05-19 DIAGNOSIS — N186 End stage renal disease: Secondary | ICD-10-CM | POA: Diagnosis not present

## 2017-05-21 DIAGNOSIS — Z992 Dependence on renal dialysis: Secondary | ICD-10-CM | POA: Diagnosis not present

## 2017-05-21 DIAGNOSIS — Z85828 Personal history of other malignant neoplasm of skin: Secondary | ICD-10-CM | POA: Diagnosis not present

## 2017-05-21 DIAGNOSIS — L820 Inflamed seborrheic keratosis: Secondary | ICD-10-CM | POA: Diagnosis not present

## 2017-05-21 DIAGNOSIS — L570 Actinic keratosis: Secondary | ICD-10-CM | POA: Diagnosis not present

## 2017-05-21 DIAGNOSIS — Z08 Encounter for follow-up examination after completed treatment for malignant neoplasm: Secondary | ICD-10-CM | POA: Diagnosis not present

## 2017-05-21 DIAGNOSIS — N186 End stage renal disease: Secondary | ICD-10-CM | POA: Diagnosis not present

## 2017-05-21 DIAGNOSIS — X32XXXD Exposure to sunlight, subsequent encounter: Secondary | ICD-10-CM | POA: Diagnosis not present

## 2017-05-23 DIAGNOSIS — Z992 Dependence on renal dialysis: Secondary | ICD-10-CM | POA: Diagnosis not present

## 2017-05-23 DIAGNOSIS — N186 End stage renal disease: Secondary | ICD-10-CM | POA: Diagnosis not present

## 2017-05-25 DIAGNOSIS — Z992 Dependence on renal dialysis: Secondary | ICD-10-CM | POA: Diagnosis not present

## 2017-05-25 DIAGNOSIS — N186 End stage renal disease: Secondary | ICD-10-CM | POA: Diagnosis not present

## 2017-05-28 DIAGNOSIS — Z992 Dependence on renal dialysis: Secondary | ICD-10-CM | POA: Diagnosis not present

## 2017-05-28 DIAGNOSIS — N186 End stage renal disease: Secondary | ICD-10-CM | POA: Diagnosis not present

## 2017-05-30 DIAGNOSIS — N186 End stage renal disease: Secondary | ICD-10-CM | POA: Diagnosis not present

## 2017-05-30 DIAGNOSIS — Z992 Dependence on renal dialysis: Secondary | ICD-10-CM | POA: Diagnosis not present

## 2017-06-01 DIAGNOSIS — N186 End stage renal disease: Secondary | ICD-10-CM | POA: Diagnosis not present

## 2017-06-01 DIAGNOSIS — Z992 Dependence on renal dialysis: Secondary | ICD-10-CM | POA: Diagnosis not present

## 2017-06-04 DIAGNOSIS — N186 End stage renal disease: Secondary | ICD-10-CM | POA: Diagnosis not present

## 2017-06-04 DIAGNOSIS — Z992 Dependence on renal dialysis: Secondary | ICD-10-CM | POA: Diagnosis not present

## 2017-06-05 DIAGNOSIS — Z6841 Body Mass Index (BMI) 40.0 and over, adult: Secondary | ICD-10-CM | POA: Diagnosis not present

## 2017-06-05 DIAGNOSIS — Z0000 Encounter for general adult medical examination without abnormal findings: Secondary | ICD-10-CM | POA: Diagnosis not present

## 2017-06-05 DIAGNOSIS — N186 End stage renal disease: Secondary | ICD-10-CM | POA: Diagnosis not present

## 2017-06-05 DIAGNOSIS — Z992 Dependence on renal dialysis: Secondary | ICD-10-CM | POA: Diagnosis not present

## 2017-06-05 DIAGNOSIS — Z23 Encounter for immunization: Secondary | ICD-10-CM | POA: Diagnosis not present

## 2017-06-06 ENCOUNTER — Other Ambulatory Visit: Payer: Self-pay | Admitting: Cardiovascular Disease

## 2017-06-06 DIAGNOSIS — Z992 Dependence on renal dialysis: Secondary | ICD-10-CM | POA: Diagnosis not present

## 2017-06-06 DIAGNOSIS — N186 End stage renal disease: Secondary | ICD-10-CM | POA: Diagnosis not present

## 2017-06-08 DIAGNOSIS — Z992 Dependence on renal dialysis: Secondary | ICD-10-CM | POA: Diagnosis not present

## 2017-06-08 DIAGNOSIS — N186 End stage renal disease: Secondary | ICD-10-CM | POA: Diagnosis not present

## 2017-06-11 DIAGNOSIS — N186 End stage renal disease: Secondary | ICD-10-CM | POA: Diagnosis not present

## 2017-06-11 DIAGNOSIS — Z992 Dependence on renal dialysis: Secondary | ICD-10-CM | POA: Diagnosis not present

## 2017-06-13 DIAGNOSIS — Z992 Dependence on renal dialysis: Secondary | ICD-10-CM | POA: Diagnosis not present

## 2017-06-13 DIAGNOSIS — N186 End stage renal disease: Secondary | ICD-10-CM | POA: Diagnosis not present

## 2017-06-15 ENCOUNTER — Ambulatory Visit (INDEPENDENT_AMBULATORY_CARE_PROVIDER_SITE_OTHER): Payer: Medicare HMO | Admitting: *Deleted

## 2017-06-15 DIAGNOSIS — N186 End stage renal disease: Secondary | ICD-10-CM | POA: Diagnosis not present

## 2017-06-15 DIAGNOSIS — Z992 Dependence on renal dialysis: Secondary | ICD-10-CM | POA: Diagnosis not present

## 2017-06-15 DIAGNOSIS — I4891 Unspecified atrial fibrillation: Secondary | ICD-10-CM | POA: Diagnosis not present

## 2017-06-15 DIAGNOSIS — Z5181 Encounter for therapeutic drug level monitoring: Secondary | ICD-10-CM

## 2017-06-15 LAB — POCT INR: INR: 2.7

## 2017-06-15 NOTE — Progress Notes (Signed)
Continue coumadin 1 1/2 tablets daily Recheck in 6 weeks Dialysis M,W,F

## 2017-06-18 DIAGNOSIS — Z992 Dependence on renal dialysis: Secondary | ICD-10-CM | POA: Diagnosis not present

## 2017-06-18 DIAGNOSIS — N186 End stage renal disease: Secondary | ICD-10-CM | POA: Diagnosis not present

## 2017-06-20 DIAGNOSIS — N186 End stage renal disease: Secondary | ICD-10-CM | POA: Diagnosis not present

## 2017-06-20 DIAGNOSIS — Z992 Dependence on renal dialysis: Secondary | ICD-10-CM | POA: Diagnosis not present

## 2017-06-22 DIAGNOSIS — Z992 Dependence on renal dialysis: Secondary | ICD-10-CM | POA: Diagnosis not present

## 2017-06-22 DIAGNOSIS — N186 End stage renal disease: Secondary | ICD-10-CM | POA: Diagnosis not present

## 2017-06-25 DIAGNOSIS — N186 End stage renal disease: Secondary | ICD-10-CM | POA: Diagnosis not present

## 2017-06-25 DIAGNOSIS — Z992 Dependence on renal dialysis: Secondary | ICD-10-CM | POA: Diagnosis not present

## 2017-06-27 DIAGNOSIS — Z992 Dependence on renal dialysis: Secondary | ICD-10-CM | POA: Diagnosis not present

## 2017-06-27 DIAGNOSIS — N186 End stage renal disease: Secondary | ICD-10-CM | POA: Diagnosis not present

## 2017-06-29 DIAGNOSIS — Z992 Dependence on renal dialysis: Secondary | ICD-10-CM | POA: Diagnosis not present

## 2017-06-29 DIAGNOSIS — N186 End stage renal disease: Secondary | ICD-10-CM | POA: Diagnosis not present

## 2017-07-02 DIAGNOSIS — Z992 Dependence on renal dialysis: Secondary | ICD-10-CM | POA: Diagnosis not present

## 2017-07-02 DIAGNOSIS — N186 End stage renal disease: Secondary | ICD-10-CM | POA: Diagnosis not present

## 2017-07-04 DIAGNOSIS — N186 End stage renal disease: Secondary | ICD-10-CM | POA: Diagnosis not present

## 2017-07-04 DIAGNOSIS — Z992 Dependence on renal dialysis: Secondary | ICD-10-CM | POA: Diagnosis not present

## 2017-07-06 DIAGNOSIS — Z992 Dependence on renal dialysis: Secondary | ICD-10-CM | POA: Diagnosis not present

## 2017-07-06 DIAGNOSIS — N186 End stage renal disease: Secondary | ICD-10-CM | POA: Diagnosis not present

## 2017-07-09 DIAGNOSIS — Z992 Dependence on renal dialysis: Secondary | ICD-10-CM | POA: Diagnosis not present

## 2017-07-09 DIAGNOSIS — N186 End stage renal disease: Secondary | ICD-10-CM | POA: Diagnosis not present

## 2017-07-11 DIAGNOSIS — N186 End stage renal disease: Secondary | ICD-10-CM | POA: Diagnosis not present

## 2017-07-11 DIAGNOSIS — Z992 Dependence on renal dialysis: Secondary | ICD-10-CM | POA: Diagnosis not present

## 2017-07-13 DIAGNOSIS — Z992 Dependence on renal dialysis: Secondary | ICD-10-CM | POA: Diagnosis not present

## 2017-07-13 DIAGNOSIS — N186 End stage renal disease: Secondary | ICD-10-CM | POA: Diagnosis not present

## 2017-07-17 DIAGNOSIS — N186 End stage renal disease: Secondary | ICD-10-CM | POA: Diagnosis not present

## 2017-07-17 DIAGNOSIS — Z992 Dependence on renal dialysis: Secondary | ICD-10-CM | POA: Diagnosis not present

## 2017-07-18 DIAGNOSIS — Z992 Dependence on renal dialysis: Secondary | ICD-10-CM | POA: Diagnosis not present

## 2017-07-18 DIAGNOSIS — N186 End stage renal disease: Secondary | ICD-10-CM | POA: Diagnosis not present

## 2017-07-20 DIAGNOSIS — N186 End stage renal disease: Secondary | ICD-10-CM | POA: Diagnosis not present

## 2017-07-20 DIAGNOSIS — Z992 Dependence on renal dialysis: Secondary | ICD-10-CM | POA: Diagnosis not present

## 2017-07-23 DIAGNOSIS — N186 End stage renal disease: Secondary | ICD-10-CM | POA: Diagnosis not present

## 2017-07-23 DIAGNOSIS — Z992 Dependence on renal dialysis: Secondary | ICD-10-CM | POA: Diagnosis not present

## 2017-07-25 ENCOUNTER — Ambulatory Visit (INDEPENDENT_AMBULATORY_CARE_PROVIDER_SITE_OTHER): Payer: Medicare HMO | Admitting: *Deleted

## 2017-07-25 DIAGNOSIS — Z5181 Encounter for therapeutic drug level monitoring: Secondary | ICD-10-CM

## 2017-07-25 DIAGNOSIS — I4891 Unspecified atrial fibrillation: Secondary | ICD-10-CM | POA: Diagnosis not present

## 2017-07-25 DIAGNOSIS — N186 End stage renal disease: Secondary | ICD-10-CM | POA: Diagnosis not present

## 2017-07-25 DIAGNOSIS — Z992 Dependence on renal dialysis: Secondary | ICD-10-CM | POA: Diagnosis not present

## 2017-07-25 LAB — POCT INR: INR: 2.7

## 2017-07-27 DIAGNOSIS — Z992 Dependence on renal dialysis: Secondary | ICD-10-CM | POA: Diagnosis not present

## 2017-07-27 DIAGNOSIS — N186 End stage renal disease: Secondary | ICD-10-CM | POA: Diagnosis not present

## 2017-07-29 DIAGNOSIS — N186 End stage renal disease: Secondary | ICD-10-CM | POA: Diagnosis not present

## 2017-07-29 DIAGNOSIS — Z992 Dependence on renal dialysis: Secondary | ICD-10-CM | POA: Diagnosis not present

## 2017-08-01 DIAGNOSIS — Z992 Dependence on renal dialysis: Secondary | ICD-10-CM | POA: Diagnosis not present

## 2017-08-01 DIAGNOSIS — N186 End stage renal disease: Secondary | ICD-10-CM | POA: Diagnosis not present

## 2017-08-03 DIAGNOSIS — Z992 Dependence on renal dialysis: Secondary | ICD-10-CM | POA: Diagnosis not present

## 2017-08-03 DIAGNOSIS — N186 End stage renal disease: Secondary | ICD-10-CM | POA: Diagnosis not present

## 2017-08-06 DIAGNOSIS — Z992 Dependence on renal dialysis: Secondary | ICD-10-CM | POA: Diagnosis not present

## 2017-08-06 DIAGNOSIS — N186 End stage renal disease: Secondary | ICD-10-CM | POA: Diagnosis not present

## 2017-08-08 DIAGNOSIS — N186 End stage renal disease: Secondary | ICD-10-CM | POA: Diagnosis not present

## 2017-08-08 DIAGNOSIS — Z992 Dependence on renal dialysis: Secondary | ICD-10-CM | POA: Diagnosis not present

## 2017-08-10 DIAGNOSIS — N186 End stage renal disease: Secondary | ICD-10-CM | POA: Diagnosis not present

## 2017-08-10 DIAGNOSIS — Z992 Dependence on renal dialysis: Secondary | ICD-10-CM | POA: Diagnosis not present

## 2017-08-13 DIAGNOSIS — N186 End stage renal disease: Secondary | ICD-10-CM | POA: Diagnosis not present

## 2017-08-13 DIAGNOSIS — Z992 Dependence on renal dialysis: Secondary | ICD-10-CM | POA: Diagnosis not present

## 2017-08-15 DIAGNOSIS — Z992 Dependence on renal dialysis: Secondary | ICD-10-CM | POA: Diagnosis not present

## 2017-08-15 DIAGNOSIS — N186 End stage renal disease: Secondary | ICD-10-CM | POA: Diagnosis not present

## 2017-08-17 DIAGNOSIS — N186 End stage renal disease: Secondary | ICD-10-CM | POA: Diagnosis not present

## 2017-08-17 DIAGNOSIS — Z992 Dependence on renal dialysis: Secondary | ICD-10-CM | POA: Diagnosis not present

## 2017-08-20 DIAGNOSIS — N186 End stage renal disease: Secondary | ICD-10-CM | POA: Diagnosis not present

## 2017-08-20 DIAGNOSIS — Z992 Dependence on renal dialysis: Secondary | ICD-10-CM | POA: Diagnosis not present

## 2017-08-22 DIAGNOSIS — Z992 Dependence on renal dialysis: Secondary | ICD-10-CM | POA: Diagnosis not present

## 2017-08-22 DIAGNOSIS — N186 End stage renal disease: Secondary | ICD-10-CM | POA: Diagnosis not present

## 2017-08-24 DIAGNOSIS — Z992 Dependence on renal dialysis: Secondary | ICD-10-CM | POA: Diagnosis not present

## 2017-08-24 DIAGNOSIS — N186 End stage renal disease: Secondary | ICD-10-CM | POA: Diagnosis not present

## 2017-08-27 DIAGNOSIS — N186 End stage renal disease: Secondary | ICD-10-CM | POA: Diagnosis not present

## 2017-08-27 DIAGNOSIS — Z992 Dependence on renal dialysis: Secondary | ICD-10-CM | POA: Diagnosis not present

## 2017-08-29 DIAGNOSIS — Z992 Dependence on renal dialysis: Secondary | ICD-10-CM | POA: Diagnosis not present

## 2017-08-29 DIAGNOSIS — N186 End stage renal disease: Secondary | ICD-10-CM | POA: Diagnosis not present

## 2017-08-31 DIAGNOSIS — Z992 Dependence on renal dialysis: Secondary | ICD-10-CM | POA: Diagnosis not present

## 2017-08-31 DIAGNOSIS — N186 End stage renal disease: Secondary | ICD-10-CM | POA: Diagnosis not present

## 2017-09-03 DIAGNOSIS — N186 End stage renal disease: Secondary | ICD-10-CM | POA: Diagnosis not present

## 2017-09-03 DIAGNOSIS — Z992 Dependence on renal dialysis: Secondary | ICD-10-CM | POA: Diagnosis not present

## 2017-09-05 ENCOUNTER — Ambulatory Visit (INDEPENDENT_AMBULATORY_CARE_PROVIDER_SITE_OTHER): Payer: Medicare HMO | Admitting: *Deleted

## 2017-09-05 DIAGNOSIS — Z992 Dependence on renal dialysis: Secondary | ICD-10-CM | POA: Diagnosis not present

## 2017-09-05 DIAGNOSIS — I4891 Unspecified atrial fibrillation: Secondary | ICD-10-CM | POA: Diagnosis not present

## 2017-09-05 DIAGNOSIS — Z5181 Encounter for therapeutic drug level monitoring: Secondary | ICD-10-CM

## 2017-09-05 DIAGNOSIS — N186 End stage renal disease: Secondary | ICD-10-CM | POA: Diagnosis not present

## 2017-09-05 LAB — POCT INR: INR: 2.5

## 2017-09-05 NOTE — Patient Instructions (Signed)
Continue coumadin 1 1/2 tablets daily Recheck in 6 weeks Dialysis M,W,F

## 2017-09-06 DIAGNOSIS — Z992 Dependence on renal dialysis: Secondary | ICD-10-CM | POA: Diagnosis not present

## 2017-09-06 DIAGNOSIS — N186 End stage renal disease: Secondary | ICD-10-CM | POA: Diagnosis not present

## 2017-09-07 DIAGNOSIS — Z992 Dependence on renal dialysis: Secondary | ICD-10-CM | POA: Diagnosis not present

## 2017-09-07 DIAGNOSIS — N186 End stage renal disease: Secondary | ICD-10-CM | POA: Diagnosis not present

## 2017-09-07 DIAGNOSIS — Z23 Encounter for immunization: Secondary | ICD-10-CM | POA: Diagnosis not present

## 2017-09-10 DIAGNOSIS — Z992 Dependence on renal dialysis: Secondary | ICD-10-CM | POA: Diagnosis not present

## 2017-09-10 DIAGNOSIS — N186 End stage renal disease: Secondary | ICD-10-CM | POA: Diagnosis not present

## 2017-09-10 DIAGNOSIS — Z23 Encounter for immunization: Secondary | ICD-10-CM | POA: Diagnosis not present

## 2017-09-12 DIAGNOSIS — Z992 Dependence on renal dialysis: Secondary | ICD-10-CM | POA: Diagnosis not present

## 2017-09-12 DIAGNOSIS — N186 End stage renal disease: Secondary | ICD-10-CM | POA: Diagnosis not present

## 2017-09-12 DIAGNOSIS — Z23 Encounter for immunization: Secondary | ICD-10-CM | POA: Diagnosis not present

## 2017-09-14 DIAGNOSIS — N186 End stage renal disease: Secondary | ICD-10-CM | POA: Diagnosis not present

## 2017-09-14 DIAGNOSIS — Z992 Dependence on renal dialysis: Secondary | ICD-10-CM | POA: Diagnosis not present

## 2017-09-14 DIAGNOSIS — Z23 Encounter for immunization: Secondary | ICD-10-CM | POA: Diagnosis not present

## 2017-09-17 DIAGNOSIS — Z23 Encounter for immunization: Secondary | ICD-10-CM | POA: Diagnosis not present

## 2017-09-17 DIAGNOSIS — Z992 Dependence on renal dialysis: Secondary | ICD-10-CM | POA: Diagnosis not present

## 2017-09-17 DIAGNOSIS — N186 End stage renal disease: Secondary | ICD-10-CM | POA: Diagnosis not present

## 2017-09-19 DIAGNOSIS — Z992 Dependence on renal dialysis: Secondary | ICD-10-CM | POA: Diagnosis not present

## 2017-09-19 DIAGNOSIS — Z23 Encounter for immunization: Secondary | ICD-10-CM | POA: Diagnosis not present

## 2017-09-19 DIAGNOSIS — N186 End stage renal disease: Secondary | ICD-10-CM | POA: Diagnosis not present

## 2017-09-21 DIAGNOSIS — N186 End stage renal disease: Secondary | ICD-10-CM | POA: Diagnosis not present

## 2017-09-21 DIAGNOSIS — Z992 Dependence on renal dialysis: Secondary | ICD-10-CM | POA: Diagnosis not present

## 2017-09-21 DIAGNOSIS — Z23 Encounter for immunization: Secondary | ICD-10-CM | POA: Diagnosis not present

## 2017-09-24 DIAGNOSIS — Z23 Encounter for immunization: Secondary | ICD-10-CM | POA: Diagnosis not present

## 2017-09-24 DIAGNOSIS — Z992 Dependence on renal dialysis: Secondary | ICD-10-CM | POA: Diagnosis not present

## 2017-09-24 DIAGNOSIS — N186 End stage renal disease: Secondary | ICD-10-CM | POA: Diagnosis not present

## 2017-09-26 DIAGNOSIS — Z23 Encounter for immunization: Secondary | ICD-10-CM | POA: Diagnosis not present

## 2017-09-26 DIAGNOSIS — Z992 Dependence on renal dialysis: Secondary | ICD-10-CM | POA: Diagnosis not present

## 2017-09-26 DIAGNOSIS — N186 End stage renal disease: Secondary | ICD-10-CM | POA: Diagnosis not present

## 2017-09-28 DIAGNOSIS — N186 End stage renal disease: Secondary | ICD-10-CM | POA: Diagnosis not present

## 2017-09-28 DIAGNOSIS — Z23 Encounter for immunization: Secondary | ICD-10-CM | POA: Diagnosis not present

## 2017-09-28 DIAGNOSIS — Z992 Dependence on renal dialysis: Secondary | ICD-10-CM | POA: Diagnosis not present

## 2017-10-01 DIAGNOSIS — N186 End stage renal disease: Secondary | ICD-10-CM | POA: Diagnosis not present

## 2017-10-01 DIAGNOSIS — Z23 Encounter for immunization: Secondary | ICD-10-CM | POA: Diagnosis not present

## 2017-10-01 DIAGNOSIS — Z992 Dependence on renal dialysis: Secondary | ICD-10-CM | POA: Diagnosis not present

## 2017-10-03 DIAGNOSIS — Z23 Encounter for immunization: Secondary | ICD-10-CM | POA: Diagnosis not present

## 2017-10-03 DIAGNOSIS — Z992 Dependence on renal dialysis: Secondary | ICD-10-CM | POA: Diagnosis not present

## 2017-10-03 DIAGNOSIS — N186 End stage renal disease: Secondary | ICD-10-CM | POA: Diagnosis not present

## 2017-10-04 DIAGNOSIS — N186 End stage renal disease: Secondary | ICD-10-CM | POA: Diagnosis not present

## 2017-10-04 DIAGNOSIS — Z992 Dependence on renal dialysis: Secondary | ICD-10-CM | POA: Diagnosis not present

## 2017-10-05 DIAGNOSIS — Z992 Dependence on renal dialysis: Secondary | ICD-10-CM | POA: Diagnosis not present

## 2017-10-05 DIAGNOSIS — N186 End stage renal disease: Secondary | ICD-10-CM | POA: Diagnosis not present

## 2017-10-08 DIAGNOSIS — N186 End stage renal disease: Secondary | ICD-10-CM | POA: Diagnosis not present

## 2017-10-08 DIAGNOSIS — Z992 Dependence on renal dialysis: Secondary | ICD-10-CM | POA: Diagnosis not present

## 2017-10-10 DIAGNOSIS — N186 End stage renal disease: Secondary | ICD-10-CM | POA: Diagnosis not present

## 2017-10-10 DIAGNOSIS — Z992 Dependence on renal dialysis: Secondary | ICD-10-CM | POA: Diagnosis not present

## 2017-10-12 DIAGNOSIS — N186 End stage renal disease: Secondary | ICD-10-CM | POA: Diagnosis not present

## 2017-10-12 DIAGNOSIS — Z992 Dependence on renal dialysis: Secondary | ICD-10-CM | POA: Diagnosis not present

## 2017-10-15 DIAGNOSIS — Z992 Dependence on renal dialysis: Secondary | ICD-10-CM | POA: Diagnosis not present

## 2017-10-15 DIAGNOSIS — N186 End stage renal disease: Secondary | ICD-10-CM | POA: Diagnosis not present

## 2017-10-15 NOTE — Progress Notes (Signed)
Cardiology Office Note   Date:  10/16/2017   ID:  Sabrina Beck, DOB November 27, 1940, MRN [id number]  PCP:  Redmond School, MD  Cardiologist:  Dr. Sallyanne Kuster   Chief Complaint  Patient presents with  . Atrial Fibrillation  . Congestive Heart Failure  . Hypertension     History of Present Illness: Sabrina Beck is a 77 y.o. female who presents for ongoing assessment and management of atrial fibrillation, history of diastolic heart failure, and ESRD on dialysis. The patient holds metoprolol prior to dialysis to prevent hypotension. Her nodes from Dr.Croitoru. Dated 08/11/2016 patient had a mild to moderately dilated left atrium, but otherwise no valvular or myocardial abnormalities on previous echo. On that last office visit, patient was stable from a cardiovascular standpoint. She was to continue on Coumadin. She is here for one year follow up.   She is here today because her nephrologist has been noticing that her heart rate is been elevated at rest. She brings with her list of her blood pressures heart rate during dialysis and is ranged between 85 and 100 bpm. Blood pressure has been somewhat elevated as well. The patient normally takes metoprolol 25 mg twice a day except for day she goes to dialysis at which time she holds the am dose.   Her main complaint is dyspnea on exertion which is contributing to her weight. She denies chest pain, dizziness, or near syncope.   Past Medical History:  Diagnosis Date  . Anemia 03/27/2011  . Atrial fibrillation ([place])   . Cancer ([place])    uterine  . Chest pain 05/15/2006   stress test perform negative for ischemia  . CHF (congestive heart failure) ([place])   . Complication of anesthesia   . Diverticulosis   . Dysrhythmia    afib  . Family history of anesthesia complication    "brother has nausea when put to sleep."  . GERD (gastroesophageal reflux disease)   . Headache   . Kidney disease    stage 4 chronic  . Mixed hyperlipidemia   . Murmur  06/04/2006,09/29/10   2D Echo perform both test shows EF 55%  . Peripheral neuropathy   . PONV (postoperative nausea and vomiting)   . Renal disorder   . Shortness of breath    with exertion  . Systemic hypertension     Past Surgical History:  Procedure Laterality Date  . ABDOMINAL HYSTERECTOMY    . APPENDECTOMY    . AV FISTULA PLACEMENT Left 11/17/2013   Procedure: CREATION OF LEFT RADIOCEPHALIC ARTERIOVENOUS (AV) FISTULA ;  Surgeon: [name], MD;  Location: [facility];  Service: Vascular;  Laterality: Left;  . BREAST SURGERY Left    tumors non canerous 2- 3 different surgeries  . CATARACT EXTRACTION W/PHACO Right 10/26/2016   Procedure: CATARACT EXTRACTION PHACO AND INTRAOCULAR LENS PLACEMENT (IOC) CDE= 11.91;  Surgeon: Tonny Branch, MD;  Location: [facility];  Service: Ophthalmology;  Laterality: Right;  right - pt knows to arrive at 6:30  . CATARACT EXTRACTION W/PHACO Left 11/30/2016   Procedure: CATARACT EXTRACTION PHACO AND INTRAOCULAR LENS PLACEMENT (IOC) CDE - 7.92 ;  Surgeon: Tonny Branch, MD;  Location: [facility];  Service: Ophthalmology;  Laterality: Left;  left  . CHOLECYSTECTOMY    . COLONOSCOPY W/ BIOPSIES AND POLYPECTOMY    . FISTULA SUPERFICIALIZATION Left 05/19/2014   Procedure: FISTULA SUPERFICIALIZATION-LEFT ARM;  Surgeon: [name], MD;  Location: [facility];  Service: Vascular;  Laterality: Left;  . INSERTION OF DIALYSIS CATHETER  N/A 01/24/2014   Procedure: INSERTION OF DIALYSIS CATHETER;  Surgeon: Rosetta Posner, MD;  Location: [facility];  Service: Vascular;  Laterality: N/A;  . KNEE ARTHROSCOPY    . TUBAL LIGATION       Current Outpatient Medications  Medication Sig Dispense Refill  . acetaminophen (TYLENOL) 500 MG tablet Take 1,000 mg by mouth every 6 (six) hours as needed for moderate pain.     . calcium acetate (PHOSLO) 667 MG capsule Take 667-2,001 capsules by mouth See admin instructions. Takes 1 capsule with snacks and 3 capsules with meals    . Cholecalciferol  (VITAMIN D) 2000 UNITS CAPS Take 2,000 Units by mouth daily.     . metoprolol tartrate (LOPRESSOR) 50 MG tablet Take 0.5 tablet twice daily except non dialysis days (Mon, Weds, and Fridays) and take 0.5 tablet only once daily in the evening 135 tablet 2  . Multiple Vitamins-Minerals (PRESERVISION AREDS 2 PO) Take 1 tablet by mouth 2 (two) times daily.    . multivitamin (RENA-VIT) TABS tablet Take 1 tablet by mouth daily.    . Omega-3 Fatty Acids (OMEGA-3 FISH OIL) 300 MG CAPS Take 300 mg by mouth 2 (two) times daily.    Marland Kitchen omeprazole (PRILOSEC) 20 MG capsule Take 20 mg by mouth daily.    Marland Kitchen Propylene Glycol (SYSTANE BALANCE) 0.6 % SOLN Place 1 drop into both eyes 3 (three) times daily as needed (for burning or dry eyes).     . RENVELA 800 MG tablet Take 800-1,600 mg by mouth See admin instructions. Takes 1 tablet with snacks and 2 tablets with meals    . warfarin (COUMADIN) 5 MG tablet TAKE 1 & 1/2 (ONE & ONE-HALF) TABLETS BY MOUTH ONCE DAILY OR  AS  DIRECTED  BY  COUMADIN  CLINIC 150 tablet 1   No current facility-administered medications for this visit.     Allergies:   Patient has no known allergies.    Social History:  The patient  reports that she quit smoking about 18 years ago. she has never used smokeless tobacco. She reports that she does not drink alcohol or use drugs.   Family History:  The patient's family history includes Cancer in her brother; Diabetes in her brother, father, mother, and sister; Heart attack in her brother and father; Heart disease in her brother and father; Hyperlipidemia in her sister; Hypertension in her brother, mother, and sister.    ROS: All other systems are reviewed and negative. Unless otherwise mentioned in H&P    PHYSICAL EXAM: VS:  BP 140/78   Pulse 98   Ht 5\' 4"  (1.626 m)   Wt 227 lb 9.6 oz (103.2 kg)   BMI 39.07 kg/m  , BMI Body mass index is 39.07 kg/m. GEN: Well nourished, well developed, in no acute distress obese HEENT: normal  Neck: no  JVD, carotid bruits, or masses Cardiac: IRRR; no murmurs, rubs, or gallops,no edema  Respiratory:  clear to auscultation bilaterally, normal work of breathing GI: soft, nontender, nondistended, + BS MS: no deformity or atrophy  dialysis shunt in left forearm  Skin: warm and dry, no rash Neuro:  Strength and sensation are intact Psych: euthymic mood, full affect   EKG:  Atrial fibrillation, heart rate 90 bpm,  Recent Labs: 11/30/2016: BUN 31; Creatinine, Ser 8.40; Hemoglobin 10.5; Potassium 4.1; Sodium 135    Lipid Panel    Component Value Date/Time   CHOL 87 (L) 06/08/2015 0735   TRIG 272 (H) 06/08/2015 3762  HDL 25 (L) 06/08/2015 0735   LDLCALC 8 06/08/2015 0735      Wt Readings from Last 3 Encounters:  10/16/17 227 lb 9.6 oz (103.2 kg)  10/19/16 221 lb (100.2 kg)  08/11/16 219 lb 9.6 oz (99.6 kg)      Other studies Reviewed: Echocardiogram 12/23/2013 Procedure narrative: Transthoracic echocardiography. Image quality was suboptimal due to poor sound transmission. - Left ventricle: The cavity size was normal. Wall thickness was increased in a pattern of moderate LVH. Systolic function was normal. The estimated ejection fraction was in the range of 50% to 55%. The study was not technically sufficient to allow evaluation of LV diastolic dysfunction due to atrial fibrillation. - Regional wall motion abnormality: Mild hypokinesis of the mid inferoseptal and apical septal myocardium. - Aortic valve: Mildly calcified annulus. - Mitral valve: Mildly calcified annulus. Mildly thickened leaflets . There was mild to moderate regurgitation. - Left atrium: The atrium was moderately dilated. - Tricuspid valve: There was mild regurgitation. Mildly elevated pulmonary pressures (RVSP 38 mmHg).  ASSESSMENT AND PLAN:  1. Atrial fibrillation: Heart rate is not controlled at rest with rate of 98 bpm. Will increase her metoprolol to 50 mg a.m. and keep it at 25 mg at  at bedtime. She is to follow-up in [facility] with Christella Noa, RN, and our Coumadin clinic in the a.m. for INR check. She is going to see her right after she finishes her dialysis in [place].  I have asked the [place] office to make an appointment for her for her BP and heart rate check on Thursday, 10/18/2017, as it is closer for her to have her blood pressure rechecked when she is in [place] then coming to [facility] for blood pressure heart rate check. Will not have it done during her PT/INR checked as she would not have taken her metoprolol the morning.  2. Hypertension: Changes on metoprolol as above. Follow-up BP and heart rate check.  3. End stage renal disease: Dialysis on Monday Wednesday Friday, holds the Toprol that morning, take her evening dose.  Current medicines are reviewed at length with the patient today.    Labs/ tests ordered today include: None  Phill Myron. West Pugh, ANP, AACC   10/16/2017 9:00 AM    [facility] [address] [place], [place] [address] Phone: [phone]; Fax: [phone]

## 2017-10-16 ENCOUNTER — Ambulatory Visit: Payer: Medicare HMO | Admitting: Adult Health

## 2017-10-16 ENCOUNTER — Encounter: Payer: Self-pay | Admitting: Adult Health

## 2017-10-16 VITALS — BP 140/78 | HR 98 | Ht 64.0 in | Wt 227.6 lb

## 2017-10-16 DIAGNOSIS — N186 End stage renal disease: Secondary | ICD-10-CM | POA: Diagnosis not present

## 2017-10-16 DIAGNOSIS — Z79899 Other long term (current) drug therapy: Secondary | ICD-10-CM | POA: Diagnosis not present

## 2017-10-16 DIAGNOSIS — I10 Essential (primary) hypertension: Secondary | ICD-10-CM

## 2017-10-16 DIAGNOSIS — I4891 Unspecified atrial fibrillation: Secondary | ICD-10-CM | POA: Diagnosis not present

## 2017-10-16 MED ORDER — METOPROLOL TARTRATE 50 MG PO TABS: ORAL_TABLET | ORAL | 2 refills | Status: DC

## 2017-10-16 NOTE — Progress Notes (Signed)
Thank you, I agree. MCr 

## 2017-10-16 NOTE — Patient Instructions (Signed)
Medication Instructions:   INCREASE METOPROLOL 1 tab(50mg ) in the am and 1/2(25mg ) tab in the pm  If you need a refill on your cardiac medications before your next appointment, please call your pharmacy.  Labwork: TSH WHILE AT DIALYSIS HERE IN OUR OFFICE AT [facility]  Special Instructions: HAVE [place] TAKE BP AND HR AND SEND [place] A MESSAGE TO NOTIFY'  YOU WILL NEED TO RETURN TO HAVE BP AND HR CHECKED ON THURSDAY ALSO.  Follow-Up: Your physician wants you to follow-up: KEEP APPT SCHEDULED WITH DR NBVAPOLI.    Thank you for choosing [facility] at [facility]!!

## 2017-10-17 ENCOUNTER — Other Ambulatory Visit: Payer: Self-pay

## 2017-10-17 ENCOUNTER — Ambulatory Visit (INDEPENDENT_AMBULATORY_CARE_PROVIDER_SITE_OTHER): Payer: Medicare HMO | Admitting: *Deleted

## 2017-10-17 DIAGNOSIS — Z5181 Encounter for therapeutic drug level monitoring: Secondary | ICD-10-CM

## 2017-10-17 DIAGNOSIS — Z992 Dependence on renal dialysis: Secondary | ICD-10-CM | POA: Diagnosis not present

## 2017-10-17 DIAGNOSIS — I4891 Unspecified atrial fibrillation: Secondary | ICD-10-CM | POA: Diagnosis not present

## 2017-10-17 DIAGNOSIS — N186 End stage renal disease: Secondary | ICD-10-CM | POA: Diagnosis not present

## 2017-10-17 DIAGNOSIS — Z79899 Other long term (current) drug therapy: Secondary | ICD-10-CM | POA: Diagnosis not present

## 2017-10-17 LAB — POCT INR: INR: 2.4

## 2017-10-17 MED ORDER — METOPROLOL TARTRATE 50 MG PO TABS: ORAL_TABLET | ORAL | 2 refills | Status: DC

## 2017-10-17 NOTE — Patient Instructions (Signed)
Continue coumadin 1 1/2 tablets daily Recheck in 6 weeks Dialysis M,W,F

## 2017-10-18 ENCOUNTER — Telehealth: Payer: Self-pay | Admitting: Cardiovascular Disease

## 2017-10-18 ENCOUNTER — Ambulatory Visit: Payer: Medicare HMO

## 2017-10-18 LAB — TSH: TSH: 2.55 u[IU]/mL (ref 0.450–4.500)

## 2017-10-18 NOTE — Telephone Encounter (Signed)
Pharmacy calling,   Pt c/o medication issue:  1. Name of Medication: Metoprolol Tartrate   2. How are you currently taking this medication (dosage and times per day)? 1(50mg )am and 1/2tab(25mg )pm on dialysis days(Mon,Weds,Fridays) and take 0.5 tab pm only  3. Are you having a reaction (difficulty breathing--STAT)? no  4. What is your medication issue? Pharmacist would like to verify the dosage instructions.

## 2017-10-18 NOTE — Telephone Encounter (Signed)
[place]. Clarified that [employer], DNP increased metoprolol to 50mg  QAM and 25mg  QHS on 10/16/17

## 2017-10-19 DIAGNOSIS — N186 End stage renal disease: Secondary | ICD-10-CM | POA: Diagnosis not present

## 2017-10-19 DIAGNOSIS — Z992 Dependence on renal dialysis: Secondary | ICD-10-CM | POA: Diagnosis not present

## 2017-10-22 DIAGNOSIS — N186 End stage renal disease: Secondary | ICD-10-CM | POA: Diagnosis not present

## 2017-10-22 DIAGNOSIS — Z992 Dependence on renal dialysis: Secondary | ICD-10-CM | POA: Diagnosis not present

## 2017-10-24 DIAGNOSIS — N186 End stage renal disease: Secondary | ICD-10-CM | POA: Diagnosis not present

## 2017-10-24 DIAGNOSIS — Z992 Dependence on renal dialysis: Secondary | ICD-10-CM | POA: Diagnosis not present

## 2017-10-26 DIAGNOSIS — N186 End stage renal disease: Secondary | ICD-10-CM | POA: Diagnosis not present

## 2017-10-26 DIAGNOSIS — Z992 Dependence on renal dialysis: Secondary | ICD-10-CM | POA: Diagnosis not present

## 2017-10-29 DIAGNOSIS — Z992 Dependence on renal dialysis: Secondary | ICD-10-CM | POA: Diagnosis not present

## 2017-10-29 DIAGNOSIS — N186 End stage renal disease: Secondary | ICD-10-CM | POA: Diagnosis not present

## 2017-10-31 DIAGNOSIS — Z992 Dependence on renal dialysis: Secondary | ICD-10-CM | POA: Diagnosis not present

## 2017-10-31 DIAGNOSIS — N186 End stage renal disease: Secondary | ICD-10-CM | POA: Diagnosis not present

## 2017-11-02 DIAGNOSIS — Z992 Dependence on renal dialysis: Secondary | ICD-10-CM | POA: Diagnosis not present

## 2017-11-02 DIAGNOSIS — N186 End stage renal disease: Secondary | ICD-10-CM | POA: Diagnosis not present

## 2017-11-04 DIAGNOSIS — N186 End stage renal disease: Secondary | ICD-10-CM | POA: Diagnosis not present

## 2017-11-04 DIAGNOSIS — Z992 Dependence on renal dialysis: Secondary | ICD-10-CM | POA: Diagnosis not present

## 2017-11-05 DIAGNOSIS — N186 End stage renal disease: Secondary | ICD-10-CM | POA: Diagnosis not present

## 2017-11-05 DIAGNOSIS — Z992 Dependence on renal dialysis: Secondary | ICD-10-CM | POA: Diagnosis not present

## 2017-11-07 DIAGNOSIS — N186 End stage renal disease: Secondary | ICD-10-CM | POA: Diagnosis not present

## 2017-11-07 DIAGNOSIS — Z992 Dependence on renal dialysis: Secondary | ICD-10-CM | POA: Diagnosis not present

## 2017-11-09 DIAGNOSIS — Z992 Dependence on renal dialysis: Secondary | ICD-10-CM | POA: Diagnosis not present

## 2017-11-09 DIAGNOSIS — N186 End stage renal disease: Secondary | ICD-10-CM | POA: Diagnosis not present

## 2017-11-12 DIAGNOSIS — Z992 Dependence on renal dialysis: Secondary | ICD-10-CM | POA: Diagnosis not present

## 2017-11-12 DIAGNOSIS — N186 End stage renal disease: Secondary | ICD-10-CM | POA: Diagnosis not present

## 2017-11-14 DIAGNOSIS — N186 End stage renal disease: Secondary | ICD-10-CM | POA: Diagnosis not present

## 2017-11-14 DIAGNOSIS — Z992 Dependence on renal dialysis: Secondary | ICD-10-CM | POA: Diagnosis not present

## 2017-11-16 DIAGNOSIS — Z992 Dependence on renal dialysis: Secondary | ICD-10-CM | POA: Diagnosis not present

## 2017-11-16 DIAGNOSIS — N186 End stage renal disease: Secondary | ICD-10-CM | POA: Diagnosis not present

## 2017-11-19 DIAGNOSIS — Z992 Dependence on renal dialysis: Secondary | ICD-10-CM | POA: Diagnosis not present

## 2017-11-19 DIAGNOSIS — N186 End stage renal disease: Secondary | ICD-10-CM | POA: Diagnosis not present

## 2017-11-21 DIAGNOSIS — Z992 Dependence on renal dialysis: Secondary | ICD-10-CM | POA: Diagnosis not present

## 2017-11-21 DIAGNOSIS — N186 End stage renal disease: Secondary | ICD-10-CM | POA: Diagnosis not present

## 2017-11-23 ENCOUNTER — Ambulatory Visit: Payer: Medicare HMO | Admitting: Cardiovascular Disease

## 2017-11-23 ENCOUNTER — Encounter: Payer: Self-pay | Admitting: Cardiovascular Disease

## 2017-11-23 VITALS — BP 128/69 | HR 88 | Ht 64.0 in | Wt 228.0 lb

## 2017-11-23 DIAGNOSIS — Z992 Dependence on renal dialysis: Secondary | ICD-10-CM | POA: Diagnosis not present

## 2017-11-23 DIAGNOSIS — I482 Chronic atrial fibrillation: Secondary | ICD-10-CM

## 2017-11-23 DIAGNOSIS — R0609 Other forms of dyspnea: Secondary | ICD-10-CM | POA: Diagnosis not present

## 2017-11-23 DIAGNOSIS — N186 End stage renal disease: Secondary | ICD-10-CM

## 2017-11-23 DIAGNOSIS — Z7901 Long term (current) use of anticoagulants: Secondary | ICD-10-CM | POA: Diagnosis not present

## 2017-11-23 DIAGNOSIS — R0600 Dyspnea, unspecified: Secondary | ICD-10-CM

## 2017-11-23 DIAGNOSIS — I4821 Permanent atrial fibrillation: Secondary | ICD-10-CM

## 2017-11-23 MED ORDER — METOPROLOL TARTRATE 25 MG PO TABS: 25.0000 mg | ORAL_TABLET | Freq: Two times a day (BID) | ORAL | 3 refills | Status: DC

## 2017-11-23 NOTE — Patient Instructions (Signed)
Dr Sallyanne Kuster has recommended making the following medication changes: 1. CHANGE THE WAY YOU TAKE METOPROLOL. Follow the schedule below: Monday, Wednesday, and Friday - take 1 tablet (25 mg total) TWICE daily Tuesday, Thursday, Saturday, Sunday - take 1 tablet (25 mg total) in the morning and take 2 tablets (50 mg total) in the evening  Your physician recommends that you schedule a follow-up appointment in 6 months. You will receive a reminder letter in the mail two months in advance. If you don't receive a letter, please call our office to schedule the follow-up appointment.  If you need a refill on your cardiac medications before your next appointment, please call your pharmacy.

## 2017-11-23 NOTE — Progress Notes (Signed)
Cardiology Office Note    Date:  11/23/2017   ID:  Sabrina Beck, DOB 01-Feb-1941, MRN [id number]  PCP:  Redmond School, MD  Cardiologist:   Sanda Klein, MD   Chief Complaint  Patient presents with  . Follow-up    History of Present Illness:  Sabrina Beck is a 77 y.o. female with permanent atrial fibrillation, diastolic heart failure and end-stage renal disease returning for follow-up.    she has been told that often her heart rate is fast when she comes in for hemodialysis.  She does not take any metoprolol on her dialysis mornings, as this has been associated with hypotension and cramps.  She takes a lower dose of metoprolol in the evenings, takes a full 50 mg dose on nondialysis mornings.  She has not had any problems with angina either at rest or with activity.  Frequently class III (bathing, dressing) shortness of breath, She never has dyspnea at rest.  Importantly, he does not feel more short of breath before dialysis that he does after dialysis.  She does have a history of smoking a pack a day for over 30 years but quit maybe as long as 20 years ago.  She does not have wheezing or cough.  She has not had any neurological events.  She has not had any bleeding problems. As before, takes a long time for the bleeding to stop after dialysis access.  She is known to have a mild to moderately dilated left atrium, but otherwise no significant valvular or myocardial abnormalities on previous echoes. She has never undergone coronary angiography but had a normal nuclear stress test in 2007 and does not have angina pectoris.  Past Medical History:  Diagnosis Date  . Anemia 03/27/2011  . Atrial fibrillation ([place])   . Cancer ([place])    uterine  . Chest pain 05/15/2006   stress test perform negative for ischemia  . CHF (congestive heart failure) ([place])   . Complication of anesthesia   . Diverticulosis   . Dysrhythmia    afib  . Family history of anesthesia complication    "brother  has nausea when put to sleep."  . GERD (gastroesophageal reflux disease)   . Headache   . Kidney disease    stage 4 chronic  . Mixed hyperlipidemia   . Murmur 06/04/2006,09/29/10   2D Echo perform both test shows EF 55%  . Peripheral neuropathy   . PONV (postoperative nausea and vomiting)   . Renal disorder   . Shortness of breath    with exertion  . Systemic hypertension     Past Surgical History:  Procedure Laterality Date  . ABDOMINAL HYSTERECTOMY    . APPENDECTOMY    . AV FISTULA PLACEMENT Left 11/17/2013   Procedure: CREATION OF LEFT RADIOCEPHALIC ARTERIOVENOUS (AV) FISTULA ;  Surgeon: [name], MD;  Location: [facility];  Service: Vascular;  Laterality: Left;  . BREAST SURGERY Left    tumors non canerous 2- 3 different surgeries  . CATARACT EXTRACTION W/PHACO Right 10/26/2016   Procedure: CATARACT EXTRACTION PHACO AND INTRAOCULAR LENS PLACEMENT (IOC) CDE= 11.91;  Surgeon: Tonny Branch, MD;  Location: [facility];  Service: Ophthalmology;  Laterality: Right;  right - pt knows to arrive at 6:30  . CATARACT EXTRACTION W/PHACO Left 11/30/2016   Procedure: CATARACT EXTRACTION PHACO AND INTRAOCULAR LENS PLACEMENT (IOC) CDE - 7.92 ;  Surgeon: Tonny Branch, MD;  Location: [facility];  Service: Ophthalmology;  Laterality: Left;  left  . CHOLECYSTECTOMY    .  COLONOSCOPY W/ BIOPSIES AND POLYPECTOMY    . FISTULA SUPERFICIALIZATION Left 05/19/2014   Procedure: FISTULA SUPERFICIALIZATION-LEFT ARM;  Surgeon: [name], MD;  Location: [facility];  Service: Vascular;  Laterality: Left;  . INSERTION OF DIALYSIS CATHETER N/A 01/24/2014   Procedure: INSERTION OF DIALYSIS CATHETER;  Surgeon: Rosetta Posner, MD;  Location: [facility];  Service: Vascular;  Laterality: N/A;  . KNEE ARTHROSCOPY    . TUBAL LIGATION      Current Medications: Outpatient Medications Prior to Visit  Medication Sig Dispense Refill  . acetaminophen (TYLENOL) 500 MG tablet Take 1,000 mg by mouth every 6 (six) hours as needed for moderate  pain.     . calcium acetate (PHOSLO) 667 MG capsule Take 667-2,001 capsules by mouth See admin instructions. Takes 1 capsule with snacks and 3 capsules with meals    . Cholecalciferol (VITAMIN D) 2000 UNITS CAPS Take 2,000 Units by mouth daily.     . metoprolol tartrate (LOPRESSOR) 50 MG tablet 1(50mg )am and 1/2tab(25mg )pm on dialysis days(Mon,Weds,Fridays) and take 0.5 tab pm only 135 tablet 2  . Multiple Vitamins-Minerals (PRESERVISION AREDS 2 PO) Take 1 tablet by mouth 2 (two) times daily.    . multivitamin (RENA-VIT) TABS tablet Take 1 tablet by mouth daily.    . Omega-3 Fatty Acids (OMEGA-3 FISH OIL) 300 MG CAPS Take 300 mg by mouth 2 (two) times daily.    Marland Kitchen omeprazole (PRILOSEC) 20 MG capsule Take 20 mg by mouth daily.    Marland Kitchen Propylene Glycol (SYSTANE BALANCE) 0.6 % SOLN Place 1 drop into both eyes 3 (three) times daily as needed (for burning or dry eyes).     . RENVELA 800 MG tablet Take 800-1,600 mg by mouth See admin instructions. Takes 1 tablet with snacks and 2 tablets with meals    . warfarin (COUMADIN) 5 MG tablet TAKE 1 & 1/2 (ONE & ONE-HALF) TABLETS BY MOUTH ONCE DAILY OR  AS  DIRECTED  BY  COUMADIN  CLINIC 150 tablet 1   No facility-administered medications prior to visit.      Allergies:   Patient has no known allergies.   Social History   Socioeconomic History  . Marital status: Widowed    Spouse name: Not on file  . Number of children: Not on file  . Years of education: Not on file  . Highest education level: Not on file  Occupational History  . Not on file  Social Needs  . Financial resource strain: Not on file  . Food insecurity:    Worry: Not on file    Inability: Not on file  . Transportation needs:    Medical: Not on file    Non-medical: Not on file  Tobacco Use  . Smoking status: Former Smoker    Last attempt to quit: 08/08/1999    Years since quitting: 18.3  . Smokeless tobacco: Never Used  Substance and Sexual Activity  . Alcohol use: No     Alcohol/week: 0.0 oz  . Drug use: No  . Sexual activity: Not Currently  Lifestyle  . Physical activity:    Days per week: Not on file    Minutes per session: Not on file  . Stress: Not on file  Relationships  . Social connections:    Talks on phone: Not on file    Gets together: Not on file    Attends religious service: Not on file    Active member of club or organization: Not on file  Attends meetings of clubs or organizations: Not on file    Relationship status: Not on file  Other Topics Concern  . Not on file  Social History Narrative   Lives in a one story home.  One of her sons lives with her.  Has 2 sons.  Retired Oncologist.  Eduction: high school.     Family History:  The patient's family history includes Cancer in her brother; Diabetes in her brother, father, mother, and sister; Heart attack in her brother and father; Heart disease in her brother and father; Hyperlipidemia in her sister; Hypertension in her brother, mother, and sister.   ROS:   Please see the history of present illness.    ROS All other systems reviewed and are negative.   PHYSICAL EXAM:   VS:  BP 128/69   Pulse 88   Ht 5\' 4"  (1.626 m)   Wt 228 lb (103.4 kg)   BMI 39.14 kg/m     General: Alert, oriented x3, no distress, severely obese Head: no evidence of trauma, PERRL, EOMI, no exophtalmos or lid lag, no myxedema, no xanthelasma; normal ears, nose and oropharynx Neck: normal jugular venous pulsations and no hepatojugular reflux; brisk carotid pulses without delay and no carotid bruits Chest: clear to auscultation, no signs of consolidation by percussion or palpation, normal fremitus, symmetrical and full respiratory excursions Cardiovascular: normal position and quality of the apical impulse, regular rhythm, normal first and second heart sounds, no murmurs, rubs or gallops. Excellent bruit and thrill on left forearm AV fistula Abdomen: no tenderness or distention, no masses by palpation, no  abnormal pulsatility or arterial bruits, normal bowel sounds, no hepatosplenomegaly Extremities: no clubbing, cyanosis or edema; 2+ radial, ulnar and brachial pulses bilaterally; 2+ right femoral, posterior tibial and dorsalis pedis pulses; 2+ left femoral, posterior tibial and dorsalis pedis pulses; no subclavian or femoral bruits Neurological: grossly nonfocal Psych: Normal mood and affect   Wt Readings from Last 3 Encounters:  11/23/17 228 lb (103.4 kg)  10/16/17 227 lb 9.6 oz (103.2 kg)  10/19/16 221 lb (100.2 kg)      Studies/Labs Reviewed:   EKG:  EKG is ordered today.  The ekg ordered 10/16/2017 showed AFib with borderline RVR, nonspecific repol changes Recent Labs: 11/30/2016: BUN 31; Creatinine, Ser 8.40; Hemoglobin 10.5; Potassium 4.1; Sodium 135 10/17/2017: TSH 2.550   Lipid Panel    Component Value Date/Time   CHOL 87 (L) 06/08/2015 0735   TRIG 272 (H) 06/08/2015 0735   HDL 25 (L) 06/08/2015 0735   LDLCALC 8 06/08/2015 0735     ASSESSMENT:    No diagnosis found.   PLAN:  In order of problems listed above:  1. AFib: After the last adjustment beta-blocker dose, ventricular rate control has improved, patient still often has tachycardia on the morning she comes in for dialysis.  We will try to introduce a very small amount of beta-blocker most mornings, 25 mg.  If she develops hypotension and cramps with dialysis I would simply discontinue that beta-blocker dose on dialysis mornings.  I think we can tolerate heart rates up to 110 bpm, especially since it is only intermittently .  CHADSVasc 3-4 (age 32, gender, questionable CHF) 2. CHF vs COPD: Once she started hemodialysis, heart failure seemed to be well compensated.  Her current pattern of shortness of breath is not suggestive of heart failure, but rather a pulmonary problem.  If it was heart failure, I expect her to be worse after a long weekend without  dialysis.  Previous attempts at challenging her dry weight did not  help and were not tolerated.   I offered PFTs but she does not want to have any testing at this time. 3. ESRD: Compliant and well tolerant of dialysis. 4. Tolerating warfarin anticoagulation without serious bleeding problems    Medication Adjustments/Labs and Tests Ordered: Current medicines are reviewed at length with the patient today.  Concerns regarding medicines are outlined above.  Medication changes, Labs and Tests ordered today are listed in the Patient Instructions below. There are no Patient Instructions on file for this visit.   Signed, Sanda Klein, MD  11/23/2017 12:04 PM    [address], [place], [place]  [address] Phone: [phone]; Fax: [phone]

## 2017-11-26 DIAGNOSIS — Z992 Dependence on renal dialysis: Secondary | ICD-10-CM | POA: Diagnosis not present

## 2017-11-26 DIAGNOSIS — N186 End stage renal disease: Secondary | ICD-10-CM | POA: Diagnosis not present

## 2017-11-28 ENCOUNTER — Ambulatory Visit (INDEPENDENT_AMBULATORY_CARE_PROVIDER_SITE_OTHER): Payer: Medicare HMO | Admitting: *Deleted

## 2017-11-28 ENCOUNTER — Other Ambulatory Visit: Payer: Self-pay | Admitting: Cardiovascular Disease

## 2017-11-28 DIAGNOSIS — N186 End stage renal disease: Secondary | ICD-10-CM | POA: Diagnosis not present

## 2017-11-28 DIAGNOSIS — I4891 Unspecified atrial fibrillation: Secondary | ICD-10-CM | POA: Diagnosis not present

## 2017-11-28 DIAGNOSIS — Z5181 Encounter for therapeutic drug level monitoring: Secondary | ICD-10-CM | POA: Diagnosis not present

## 2017-11-28 DIAGNOSIS — Z992 Dependence on renal dialysis: Secondary | ICD-10-CM | POA: Diagnosis not present

## 2017-11-28 LAB — POCT INR: INR: 2.3

## 2017-11-28 NOTE — Patient Instructions (Signed)
Continue coumadin 1 1/2 tablets daily Recheck in 6 weeks Dialysis M,W,F

## 2017-11-30 DIAGNOSIS — Z992 Dependence on renal dialysis: Secondary | ICD-10-CM | POA: Diagnosis not present

## 2017-11-30 DIAGNOSIS — N186 End stage renal disease: Secondary | ICD-10-CM | POA: Diagnosis not present

## 2017-12-03 DIAGNOSIS — N186 End stage renal disease: Secondary | ICD-10-CM | POA: Diagnosis not present

## 2017-12-03 DIAGNOSIS — Z992 Dependence on renal dialysis: Secondary | ICD-10-CM | POA: Diagnosis not present

## 2017-12-04 DIAGNOSIS — N186 End stage renal disease: Secondary | ICD-10-CM | POA: Diagnosis not present

## 2017-12-04 DIAGNOSIS — Z992 Dependence on renal dialysis: Secondary | ICD-10-CM | POA: Diagnosis not present

## 2017-12-05 DIAGNOSIS — Z992 Dependence on renal dialysis: Secondary | ICD-10-CM | POA: Diagnosis not present

## 2017-12-05 DIAGNOSIS — N186 End stage renal disease: Secondary | ICD-10-CM | POA: Diagnosis not present

## 2017-12-07 DIAGNOSIS — Z992 Dependence on renal dialysis: Secondary | ICD-10-CM | POA: Diagnosis not present

## 2017-12-07 DIAGNOSIS — N186 End stage renal disease: Secondary | ICD-10-CM | POA: Diagnosis not present

## 2017-12-10 DIAGNOSIS — Z992 Dependence on renal dialysis: Secondary | ICD-10-CM | POA: Diagnosis not present

## 2017-12-10 DIAGNOSIS — N186 End stage renal disease: Secondary | ICD-10-CM | POA: Diagnosis not present

## 2017-12-12 DIAGNOSIS — Z992 Dependence on renal dialysis: Secondary | ICD-10-CM | POA: Diagnosis not present

## 2017-12-12 DIAGNOSIS — N186 End stage renal disease: Secondary | ICD-10-CM | POA: Diagnosis not present

## 2017-12-14 DIAGNOSIS — N186 End stage renal disease: Secondary | ICD-10-CM | POA: Diagnosis not present

## 2017-12-14 DIAGNOSIS — Z992 Dependence on renal dialysis: Secondary | ICD-10-CM | POA: Diagnosis not present

## 2017-12-17 DIAGNOSIS — Z992 Dependence on renal dialysis: Secondary | ICD-10-CM | POA: Diagnosis not present

## 2017-12-17 DIAGNOSIS — N186 End stage renal disease: Secondary | ICD-10-CM | POA: Diagnosis not present

## 2017-12-19 DIAGNOSIS — Z992 Dependence on renal dialysis: Secondary | ICD-10-CM | POA: Diagnosis not present

## 2017-12-19 DIAGNOSIS — N186 End stage renal disease: Secondary | ICD-10-CM | POA: Diagnosis not present

## 2017-12-21 DIAGNOSIS — N186 End stage renal disease: Secondary | ICD-10-CM | POA: Diagnosis not present

## 2017-12-21 DIAGNOSIS — Z992 Dependence on renal dialysis: Secondary | ICD-10-CM | POA: Diagnosis not present

## 2017-12-24 DIAGNOSIS — Z992 Dependence on renal dialysis: Secondary | ICD-10-CM | POA: Diagnosis not present

## 2017-12-24 DIAGNOSIS — N186 End stage renal disease: Secondary | ICD-10-CM | POA: Diagnosis not present

## 2017-12-26 DIAGNOSIS — N186 End stage renal disease: Secondary | ICD-10-CM | POA: Diagnosis not present

## 2017-12-26 DIAGNOSIS — Z992 Dependence on renal dialysis: Secondary | ICD-10-CM | POA: Diagnosis not present

## 2017-12-28 DIAGNOSIS — Z992 Dependence on renal dialysis: Secondary | ICD-10-CM | POA: Diagnosis not present

## 2017-12-28 DIAGNOSIS — N186 End stage renal disease: Secondary | ICD-10-CM | POA: Diagnosis not present

## 2017-12-31 DIAGNOSIS — Z992 Dependence on renal dialysis: Secondary | ICD-10-CM | POA: Diagnosis not present

## 2017-12-31 DIAGNOSIS — N186 End stage renal disease: Secondary | ICD-10-CM | POA: Diagnosis not present

## 2018-01-02 DIAGNOSIS — Z992 Dependence on renal dialysis: Secondary | ICD-10-CM | POA: Diagnosis not present

## 2018-01-02 DIAGNOSIS — N186 End stage renal disease: Secondary | ICD-10-CM | POA: Diagnosis not present

## 2018-01-04 DIAGNOSIS — N186 End stage renal disease: Secondary | ICD-10-CM | POA: Diagnosis not present

## 2018-01-04 DIAGNOSIS — Z992 Dependence on renal dialysis: Secondary | ICD-10-CM | POA: Diagnosis not present

## 2018-01-07 DIAGNOSIS — Z992 Dependence on renal dialysis: Secondary | ICD-10-CM | POA: Diagnosis not present

## 2018-01-07 DIAGNOSIS — N186 End stage renal disease: Secondary | ICD-10-CM | POA: Diagnosis not present

## 2018-01-09 ENCOUNTER — Ambulatory Visit (INDEPENDENT_AMBULATORY_CARE_PROVIDER_SITE_OTHER): Payer: Medicare HMO | Admitting: *Deleted

## 2018-01-09 DIAGNOSIS — I4891 Unspecified atrial fibrillation: Secondary | ICD-10-CM | POA: Diagnosis not present

## 2018-01-09 DIAGNOSIS — Z5181 Encounter for therapeutic drug level monitoring: Secondary | ICD-10-CM | POA: Diagnosis not present

## 2018-01-09 DIAGNOSIS — Z992 Dependence on renal dialysis: Secondary | ICD-10-CM | POA: Diagnosis not present

## 2018-01-09 DIAGNOSIS — N186 End stage renal disease: Secondary | ICD-10-CM | POA: Diagnosis not present

## 2018-01-09 LAB — POCT INR: INR: 1.8 — AB (ref 2.0–3.0)

## 2018-01-09 NOTE — Patient Instructions (Signed)
Take 2 tablets tonight and tomorrow night then resume 1 1/2 tablets daily Recheck in 4 weeks Dialysis M,W,F

## 2018-01-11 DIAGNOSIS — Z992 Dependence on renal dialysis: Secondary | ICD-10-CM | POA: Diagnosis not present

## 2018-01-11 DIAGNOSIS — N186 End stage renal disease: Secondary | ICD-10-CM | POA: Diagnosis not present

## 2018-01-14 DIAGNOSIS — N186 End stage renal disease: Secondary | ICD-10-CM | POA: Diagnosis not present

## 2018-01-14 DIAGNOSIS — Z992 Dependence on renal dialysis: Secondary | ICD-10-CM | POA: Diagnosis not present

## 2018-01-16 DIAGNOSIS — Z992 Dependence on renal dialysis: Secondary | ICD-10-CM | POA: Diagnosis not present

## 2018-01-16 DIAGNOSIS — N186 End stage renal disease: Secondary | ICD-10-CM | POA: Diagnosis not present

## 2018-01-18 DIAGNOSIS — N186 End stage renal disease: Secondary | ICD-10-CM | POA: Diagnosis not present

## 2018-01-18 DIAGNOSIS — Z992 Dependence on renal dialysis: Secondary | ICD-10-CM | POA: Diagnosis not present

## 2018-01-21 DIAGNOSIS — N186 End stage renal disease: Secondary | ICD-10-CM | POA: Diagnosis not present

## 2018-01-21 DIAGNOSIS — Z992 Dependence on renal dialysis: Secondary | ICD-10-CM | POA: Diagnosis not present

## 2018-01-23 DIAGNOSIS — N186 End stage renal disease: Secondary | ICD-10-CM | POA: Diagnosis not present

## 2018-01-23 DIAGNOSIS — Z992 Dependence on renal dialysis: Secondary | ICD-10-CM | POA: Diagnosis not present

## 2018-01-25 DIAGNOSIS — N186 End stage renal disease: Secondary | ICD-10-CM | POA: Diagnosis not present

## 2018-01-25 DIAGNOSIS — Z992 Dependence on renal dialysis: Secondary | ICD-10-CM | POA: Diagnosis not present

## 2018-01-28 DIAGNOSIS — Z992 Dependence on renal dialysis: Secondary | ICD-10-CM | POA: Diagnosis not present

## 2018-01-28 DIAGNOSIS — N186 End stage renal disease: Secondary | ICD-10-CM | POA: Diagnosis not present

## 2018-01-30 DIAGNOSIS — N186 End stage renal disease: Secondary | ICD-10-CM | POA: Diagnosis not present

## 2018-01-30 DIAGNOSIS — Z992 Dependence on renal dialysis: Secondary | ICD-10-CM | POA: Diagnosis not present

## 2018-02-01 DIAGNOSIS — N186 End stage renal disease: Secondary | ICD-10-CM | POA: Diagnosis not present

## 2018-02-01 DIAGNOSIS — Z992 Dependence on renal dialysis: Secondary | ICD-10-CM | POA: Diagnosis not present

## 2018-02-03 DIAGNOSIS — Z992 Dependence on renal dialysis: Secondary | ICD-10-CM | POA: Diagnosis not present

## 2018-02-03 DIAGNOSIS — N186 End stage renal disease: Secondary | ICD-10-CM | POA: Diagnosis not present

## 2018-02-04 DIAGNOSIS — E8779 Other fluid overload: Secondary | ICD-10-CM | POA: Diagnosis not present

## 2018-02-04 DIAGNOSIS — Z992 Dependence on renal dialysis: Secondary | ICD-10-CM | POA: Diagnosis not present

## 2018-02-04 DIAGNOSIS — N186 End stage renal disease: Secondary | ICD-10-CM | POA: Diagnosis not present

## 2018-02-06 DIAGNOSIS — N186 End stage renal disease: Secondary | ICD-10-CM | POA: Diagnosis not present

## 2018-02-06 DIAGNOSIS — Z992 Dependence on renal dialysis: Secondary | ICD-10-CM | POA: Diagnosis not present

## 2018-02-06 DIAGNOSIS — E8779 Other fluid overload: Secondary | ICD-10-CM | POA: Diagnosis not present

## 2018-02-08 DIAGNOSIS — Z992 Dependence on renal dialysis: Secondary | ICD-10-CM | POA: Diagnosis not present

## 2018-02-08 DIAGNOSIS — E8779 Other fluid overload: Secondary | ICD-10-CM | POA: Diagnosis not present

## 2018-02-08 DIAGNOSIS — N186 End stage renal disease: Secondary | ICD-10-CM | POA: Diagnosis not present

## 2018-02-11 ENCOUNTER — Ambulatory Visit (INDEPENDENT_AMBULATORY_CARE_PROVIDER_SITE_OTHER): Payer: Medicare HMO | Admitting: *Deleted

## 2018-02-11 DIAGNOSIS — N186 End stage renal disease: Secondary | ICD-10-CM | POA: Diagnosis not present

## 2018-02-11 DIAGNOSIS — Z5181 Encounter for therapeutic drug level monitoring: Secondary | ICD-10-CM

## 2018-02-11 DIAGNOSIS — I4891 Unspecified atrial fibrillation: Secondary | ICD-10-CM | POA: Diagnosis not present

## 2018-02-11 DIAGNOSIS — Z992 Dependence on renal dialysis: Secondary | ICD-10-CM | POA: Diagnosis not present

## 2018-02-11 DIAGNOSIS — E8779 Other fluid overload: Secondary | ICD-10-CM | POA: Diagnosis not present

## 2018-02-11 LAB — POCT INR: INR: 2 (ref 2.0–3.0)

## 2018-02-11 NOTE — Patient Instructions (Signed)
Increase coumadin to 1 1/2 tablets daily except 2 tablets on Mondays Recheck in 4 weeks Dialysis M,W,F

## 2018-02-12 DIAGNOSIS — N186 End stage renal disease: Secondary | ICD-10-CM | POA: Diagnosis not present

## 2018-02-12 DIAGNOSIS — E8779 Other fluid overload: Secondary | ICD-10-CM | POA: Diagnosis not present

## 2018-02-12 DIAGNOSIS — Z992 Dependence on renal dialysis: Secondary | ICD-10-CM | POA: Diagnosis not present

## 2018-02-13 ENCOUNTER — Other Ambulatory Visit (HOSPITAL_COMMUNITY): Payer: Self-pay | Admitting: Internal Medicine

## 2018-02-13 ENCOUNTER — Ambulatory Visit (HOSPITAL_COMMUNITY)
Admission: RE | Admit: 2018-02-13 | Discharge: 2018-02-13 | Disposition: A | Discharge status: Home / Self Care | Payer: Medicare HMO | Source: Ambulatory Visit | Attending: Internal Medicine | Admitting: Internal Medicine

## 2018-02-13 DIAGNOSIS — Z1389 Encounter for screening for other disorder: Secondary | ICD-10-CM | POA: Diagnosis not present

## 2018-02-13 DIAGNOSIS — R0600 Dyspnea, unspecified: Secondary | ICD-10-CM | POA: Insufficient documentation

## 2018-02-13 DIAGNOSIS — N186 End stage renal disease: Secondary | ICD-10-CM | POA: Diagnosis not present

## 2018-02-13 DIAGNOSIS — E8779 Other fluid overload: Secondary | ICD-10-CM | POA: Diagnosis not present

## 2018-02-13 DIAGNOSIS — I5032 Chronic diastolic (congestive) heart failure: Secondary | ICD-10-CM | POA: Diagnosis not present

## 2018-02-13 DIAGNOSIS — I509 Heart failure, unspecified: Secondary | ICD-10-CM | POA: Diagnosis not present

## 2018-02-13 DIAGNOSIS — R0602 Shortness of breath: Secondary | ICD-10-CM | POA: Diagnosis not present

## 2018-02-13 DIAGNOSIS — I700 Atherosclerosis of aorta: Secondary | ICD-10-CM | POA: Insufficient documentation

## 2018-02-13 DIAGNOSIS — E785 Hyperlipidemia, unspecified: Secondary | ICD-10-CM | POA: Diagnosis not present

## 2018-02-13 DIAGNOSIS — I517 Cardiomegaly: Secondary | ICD-10-CM | POA: Diagnosis not present

## 2018-02-13 DIAGNOSIS — Z6839 Body mass index (BMI) 39.0-39.9, adult: Secondary | ICD-10-CM | POA: Diagnosis not present

## 2018-02-13 DIAGNOSIS — R918 Other nonspecific abnormal finding of lung field: Secondary | ICD-10-CM | POA: Insufficient documentation

## 2018-02-13 DIAGNOSIS — Z992 Dependence on renal dialysis: Secondary | ICD-10-CM | POA: Diagnosis not present

## 2018-02-13 DIAGNOSIS — E8770 Fluid overload, unspecified: Secondary | ICD-10-CM | POA: Diagnosis not present

## 2018-02-15 DIAGNOSIS — Z992 Dependence on renal dialysis: Secondary | ICD-10-CM | POA: Diagnosis not present

## 2018-02-15 DIAGNOSIS — E8779 Other fluid overload: Secondary | ICD-10-CM | POA: Diagnosis not present

## 2018-02-15 DIAGNOSIS — N186 End stage renal disease: Secondary | ICD-10-CM | POA: Diagnosis not present

## 2018-02-18 DIAGNOSIS — E8779 Other fluid overload: Secondary | ICD-10-CM | POA: Diagnosis not present

## 2018-02-18 DIAGNOSIS — N186 End stage renal disease: Secondary | ICD-10-CM | POA: Diagnosis not present

## 2018-02-18 DIAGNOSIS — Z992 Dependence on renal dialysis: Secondary | ICD-10-CM | POA: Diagnosis not present

## 2018-02-20 DIAGNOSIS — N186 End stage renal disease: Secondary | ICD-10-CM | POA: Diagnosis not present

## 2018-02-20 DIAGNOSIS — E8779 Other fluid overload: Secondary | ICD-10-CM | POA: Diagnosis not present

## 2018-02-20 DIAGNOSIS — Z992 Dependence on renal dialysis: Secondary | ICD-10-CM | POA: Diagnosis not present

## 2018-02-22 DIAGNOSIS — E8779 Other fluid overload: Secondary | ICD-10-CM | POA: Diagnosis not present

## 2018-02-22 DIAGNOSIS — Z992 Dependence on renal dialysis: Secondary | ICD-10-CM | POA: Diagnosis not present

## 2018-02-22 DIAGNOSIS — N186 End stage renal disease: Secondary | ICD-10-CM | POA: Diagnosis not present

## 2018-02-25 DIAGNOSIS — N186 End stage renal disease: Secondary | ICD-10-CM | POA: Diagnosis not present

## 2018-02-25 DIAGNOSIS — E8779 Other fluid overload: Secondary | ICD-10-CM | POA: Diagnosis not present

## 2018-02-25 DIAGNOSIS — Z992 Dependence on renal dialysis: Secondary | ICD-10-CM | POA: Diagnosis not present

## 2018-02-27 ENCOUNTER — Other Ambulatory Visit (HOSPITAL_COMMUNITY): Payer: Self-pay | Admitting: Internal Medicine

## 2018-02-27 DIAGNOSIS — N186 End stage renal disease: Secondary | ICD-10-CM | POA: Diagnosis not present

## 2018-02-27 DIAGNOSIS — Z1231 Encounter for screening mammogram for malignant neoplasm of breast: Secondary | ICD-10-CM

## 2018-02-27 DIAGNOSIS — Z992 Dependence on renal dialysis: Secondary | ICD-10-CM | POA: Diagnosis not present

## 2018-02-27 DIAGNOSIS — E8779 Other fluid overload: Secondary | ICD-10-CM | POA: Diagnosis not present

## 2018-03-01 DIAGNOSIS — N186 End stage renal disease: Secondary | ICD-10-CM | POA: Diagnosis not present

## 2018-03-01 DIAGNOSIS — E8779 Other fluid overload: Secondary | ICD-10-CM | POA: Diagnosis not present

## 2018-03-01 DIAGNOSIS — Z992 Dependence on renal dialysis: Secondary | ICD-10-CM | POA: Diagnosis not present

## 2018-03-04 DIAGNOSIS — N186 End stage renal disease: Secondary | ICD-10-CM | POA: Diagnosis not present

## 2018-03-04 DIAGNOSIS — Z992 Dependence on renal dialysis: Secondary | ICD-10-CM | POA: Diagnosis not present

## 2018-03-04 DIAGNOSIS — E8779 Other fluid overload: Secondary | ICD-10-CM | POA: Diagnosis not present

## 2018-03-06 DIAGNOSIS — N186 End stage renal disease: Secondary | ICD-10-CM | POA: Diagnosis not present

## 2018-03-06 DIAGNOSIS — Z992 Dependence on renal dialysis: Secondary | ICD-10-CM | POA: Diagnosis not present

## 2018-03-06 DIAGNOSIS — E8779 Other fluid overload: Secondary | ICD-10-CM | POA: Diagnosis not present

## 2018-03-08 DIAGNOSIS — N186 End stage renal disease: Secondary | ICD-10-CM | POA: Diagnosis not present

## 2018-03-08 DIAGNOSIS — Z992 Dependence on renal dialysis: Secondary | ICD-10-CM | POA: Diagnosis not present

## 2018-03-11 ENCOUNTER — Ambulatory Visit: Payer: Medicare HMO | Admitting: Pharmacist

## 2018-03-11 DIAGNOSIS — Z5181 Encounter for therapeutic drug level monitoring: Secondary | ICD-10-CM

## 2018-03-11 DIAGNOSIS — N186 End stage renal disease: Secondary | ICD-10-CM | POA: Diagnosis not present

## 2018-03-11 DIAGNOSIS — I4891 Unspecified atrial fibrillation: Secondary | ICD-10-CM

## 2018-03-11 DIAGNOSIS — Z992 Dependence on renal dialysis: Secondary | ICD-10-CM | POA: Diagnosis not present

## 2018-03-11 LAB — POCT INR: INR: 2 (ref 2.0–3.0)

## 2018-03-11 NOTE — Patient Instructions (Signed)
Description   Increase coumadin to 1 1/2 tablets daily except 2 tablets on Mondays Recheck in 4 weeks Dialysis M,W,F

## 2018-03-13 DIAGNOSIS — Z992 Dependence on renal dialysis: Secondary | ICD-10-CM | POA: Diagnosis not present

## 2018-03-13 DIAGNOSIS — N186 End stage renal disease: Secondary | ICD-10-CM | POA: Diagnosis not present

## 2018-03-15 DIAGNOSIS — N186 End stage renal disease: Secondary | ICD-10-CM | POA: Diagnosis not present

## 2018-03-15 DIAGNOSIS — Z992 Dependence on renal dialysis: Secondary | ICD-10-CM | POA: Diagnosis not present

## 2018-03-18 DIAGNOSIS — N186 End stage renal disease: Secondary | ICD-10-CM | POA: Diagnosis not present

## 2018-03-18 DIAGNOSIS — Z992 Dependence on renal dialysis: Secondary | ICD-10-CM | POA: Diagnosis not present

## 2018-03-20 DIAGNOSIS — Z992 Dependence on renal dialysis: Secondary | ICD-10-CM | POA: Diagnosis not present

## 2018-03-20 DIAGNOSIS — N186 End stage renal disease: Secondary | ICD-10-CM | POA: Diagnosis not present

## 2018-03-22 DIAGNOSIS — Z992 Dependence on renal dialysis: Secondary | ICD-10-CM | POA: Diagnosis not present

## 2018-03-22 DIAGNOSIS — N186 End stage renal disease: Secondary | ICD-10-CM | POA: Diagnosis not present

## 2018-03-25 DIAGNOSIS — N186 End stage renal disease: Secondary | ICD-10-CM | POA: Diagnosis not present

## 2018-03-25 DIAGNOSIS — Z992 Dependence on renal dialysis: Secondary | ICD-10-CM | POA: Diagnosis not present

## 2018-03-27 DIAGNOSIS — N186 End stage renal disease: Secondary | ICD-10-CM | POA: Diagnosis not present

## 2018-03-27 DIAGNOSIS — Z992 Dependence on renal dialysis: Secondary | ICD-10-CM | POA: Diagnosis not present

## 2018-03-29 DIAGNOSIS — N186 End stage renal disease: Secondary | ICD-10-CM | POA: Diagnosis not present

## 2018-03-29 DIAGNOSIS — Z992 Dependence on renal dialysis: Secondary | ICD-10-CM | POA: Diagnosis not present

## 2018-04-01 DIAGNOSIS — N186 End stage renal disease: Secondary | ICD-10-CM | POA: Diagnosis not present

## 2018-04-01 DIAGNOSIS — Z992 Dependence on renal dialysis: Secondary | ICD-10-CM | POA: Diagnosis not present

## 2018-04-03 ENCOUNTER — Ambulatory Visit (INDEPENDENT_AMBULATORY_CARE_PROVIDER_SITE_OTHER): Payer: Medicare HMO | Admitting: *Deleted

## 2018-04-03 DIAGNOSIS — I4891 Unspecified atrial fibrillation: Secondary | ICD-10-CM | POA: Diagnosis not present

## 2018-04-03 DIAGNOSIS — Z5181 Encounter for therapeutic drug level monitoring: Secondary | ICD-10-CM | POA: Diagnosis not present

## 2018-04-03 DIAGNOSIS — Z992 Dependence on renal dialysis: Secondary | ICD-10-CM | POA: Diagnosis not present

## 2018-04-03 DIAGNOSIS — N186 End stage renal disease: Secondary | ICD-10-CM | POA: Diagnosis not present

## 2018-04-03 LAB — POCT INR: INR: 1.9 — AB (ref 2.0–3.0)

## 2018-04-03 NOTE — Patient Instructions (Signed)
Increase coumadin to 1 1/2 tablets daily except 2 tablets on Mondays (Pt only took 2 tablets on Monday x 2 wks instead of 4 wks) Recheck in 3 weeks Dialysis M,W,F

## 2018-04-05 DIAGNOSIS — Z992 Dependence on renal dialysis: Secondary | ICD-10-CM | POA: Diagnosis not present

## 2018-04-05 DIAGNOSIS — N186 End stage renal disease: Secondary | ICD-10-CM | POA: Diagnosis not present

## 2018-04-06 DIAGNOSIS — N186 End stage renal disease: Secondary | ICD-10-CM | POA: Diagnosis not present

## 2018-04-06 DIAGNOSIS — Z992 Dependence on renal dialysis: Secondary | ICD-10-CM | POA: Diagnosis not present

## 2018-04-08 DIAGNOSIS — Z992 Dependence on renal dialysis: Secondary | ICD-10-CM | POA: Diagnosis not present

## 2018-04-08 DIAGNOSIS — N186 End stage renal disease: Secondary | ICD-10-CM | POA: Diagnosis not present

## 2018-04-08 DIAGNOSIS — Z23 Encounter for immunization: Secondary | ICD-10-CM | POA: Diagnosis not present

## 2018-04-10 DIAGNOSIS — Z992 Dependence on renal dialysis: Secondary | ICD-10-CM | POA: Diagnosis not present

## 2018-04-10 DIAGNOSIS — Z23 Encounter for immunization: Secondary | ICD-10-CM | POA: Diagnosis not present

## 2018-04-10 DIAGNOSIS — N186 End stage renal disease: Secondary | ICD-10-CM | POA: Diagnosis not present

## 2018-04-12 DIAGNOSIS — Z23 Encounter for immunization: Secondary | ICD-10-CM | POA: Diagnosis not present

## 2018-04-12 DIAGNOSIS — Z992 Dependence on renal dialysis: Secondary | ICD-10-CM | POA: Diagnosis not present

## 2018-04-12 DIAGNOSIS — N186 End stage renal disease: Secondary | ICD-10-CM | POA: Diagnosis not present

## 2018-04-15 DIAGNOSIS — N186 End stage renal disease: Secondary | ICD-10-CM | POA: Diagnosis not present

## 2018-04-15 DIAGNOSIS — Z992 Dependence on renal dialysis: Secondary | ICD-10-CM | POA: Diagnosis not present

## 2018-04-15 DIAGNOSIS — D631 Anemia in chronic kidney disease: Secondary | ICD-10-CM | POA: Diagnosis not present

## 2018-04-15 DIAGNOSIS — N2581 Secondary hyperparathyroidism of renal origin: Secondary | ICD-10-CM | POA: Diagnosis not present

## 2018-04-15 DIAGNOSIS — Z23 Encounter for immunization: Secondary | ICD-10-CM | POA: Diagnosis not present

## 2018-04-17 DIAGNOSIS — N186 End stage renal disease: Secondary | ICD-10-CM | POA: Diagnosis not present

## 2018-04-17 DIAGNOSIS — Z992 Dependence on renal dialysis: Secondary | ICD-10-CM | POA: Diagnosis not present

## 2018-04-17 DIAGNOSIS — Z23 Encounter for immunization: Secondary | ICD-10-CM | POA: Diagnosis not present

## 2018-04-19 DIAGNOSIS — Z992 Dependence on renal dialysis: Secondary | ICD-10-CM | POA: Diagnosis not present

## 2018-04-19 DIAGNOSIS — Z23 Encounter for immunization: Secondary | ICD-10-CM | POA: Diagnosis not present

## 2018-04-19 DIAGNOSIS — N186 End stage renal disease: Secondary | ICD-10-CM | POA: Diagnosis not present

## 2018-04-22 DIAGNOSIS — Z992 Dependence on renal dialysis: Secondary | ICD-10-CM | POA: Diagnosis not present

## 2018-04-22 DIAGNOSIS — Z23 Encounter for immunization: Secondary | ICD-10-CM | POA: Diagnosis not present

## 2018-04-22 DIAGNOSIS — N186 End stage renal disease: Secondary | ICD-10-CM | POA: Diagnosis not present

## 2018-04-24 ENCOUNTER — Ambulatory Visit (INDEPENDENT_AMBULATORY_CARE_PROVIDER_SITE_OTHER): Payer: Medicare HMO | Admitting: *Deleted

## 2018-04-24 DIAGNOSIS — I4891 Unspecified atrial fibrillation: Secondary | ICD-10-CM

## 2018-04-24 DIAGNOSIS — Z5181 Encounter for therapeutic drug level monitoring: Secondary | ICD-10-CM

## 2018-04-24 DIAGNOSIS — Z992 Dependence on renal dialysis: Secondary | ICD-10-CM | POA: Diagnosis not present

## 2018-04-24 DIAGNOSIS — N186 End stage renal disease: Secondary | ICD-10-CM | POA: Diagnosis not present

## 2018-04-24 DIAGNOSIS — Z23 Encounter for immunization: Secondary | ICD-10-CM | POA: Diagnosis not present

## 2018-04-24 LAB — POCT INR: INR: 2.1 (ref 2.0–3.0)

## 2018-04-24 NOTE — Patient Instructions (Signed)
Continue coumadin 1 1/2 tablets daily except 2 tablets on Mondays  Recheck in 4 weeks Dialysis M,W,F

## 2018-04-26 DIAGNOSIS — Z992 Dependence on renal dialysis: Secondary | ICD-10-CM | POA: Diagnosis not present

## 2018-04-26 DIAGNOSIS — Z23 Encounter for immunization: Secondary | ICD-10-CM | POA: Diagnosis not present

## 2018-04-26 DIAGNOSIS — N186 End stage renal disease: Secondary | ICD-10-CM | POA: Diagnosis not present

## 2018-04-29 DIAGNOSIS — D631 Anemia in chronic kidney disease: Secondary | ICD-10-CM | POA: Diagnosis not present

## 2018-04-29 DIAGNOSIS — N186 End stage renal disease: Secondary | ICD-10-CM | POA: Diagnosis not present

## 2018-04-29 DIAGNOSIS — Z23 Encounter for immunization: Secondary | ICD-10-CM | POA: Diagnosis not present

## 2018-04-29 DIAGNOSIS — Z992 Dependence on renal dialysis: Secondary | ICD-10-CM | POA: Diagnosis not present

## 2018-04-29 DIAGNOSIS — N2581 Secondary hyperparathyroidism of renal origin: Secondary | ICD-10-CM | POA: Diagnosis not present

## 2018-05-01 DIAGNOSIS — N186 End stage renal disease: Secondary | ICD-10-CM | POA: Diagnosis not present

## 2018-05-01 DIAGNOSIS — Z992 Dependence on renal dialysis: Secondary | ICD-10-CM | POA: Diagnosis not present

## 2018-05-01 DIAGNOSIS — Z23 Encounter for immunization: Secondary | ICD-10-CM | POA: Diagnosis not present

## 2018-05-03 DIAGNOSIS — Z23 Encounter for immunization: Secondary | ICD-10-CM | POA: Diagnosis not present

## 2018-05-03 DIAGNOSIS — N186 End stage renal disease: Secondary | ICD-10-CM | POA: Diagnosis not present

## 2018-05-03 DIAGNOSIS — Z992 Dependence on renal dialysis: Secondary | ICD-10-CM | POA: Diagnosis not present

## 2018-05-06 DIAGNOSIS — N186 End stage renal disease: Secondary | ICD-10-CM | POA: Diagnosis not present

## 2018-05-06 DIAGNOSIS — Z992 Dependence on renal dialysis: Secondary | ICD-10-CM | POA: Diagnosis not present

## 2018-05-06 DIAGNOSIS — Z23 Encounter for immunization: Secondary | ICD-10-CM | POA: Diagnosis not present

## 2018-05-08 DIAGNOSIS — Z992 Dependence on renal dialysis: Secondary | ICD-10-CM | POA: Diagnosis not present

## 2018-05-08 DIAGNOSIS — N186 End stage renal disease: Secondary | ICD-10-CM | POA: Diagnosis not present

## 2018-05-10 DIAGNOSIS — Z992 Dependence on renal dialysis: Secondary | ICD-10-CM | POA: Diagnosis not present

## 2018-05-10 DIAGNOSIS — N186 End stage renal disease: Secondary | ICD-10-CM | POA: Diagnosis not present

## 2018-05-13 DIAGNOSIS — N186 End stage renal disease: Secondary | ICD-10-CM | POA: Diagnosis not present

## 2018-05-13 DIAGNOSIS — N2581 Secondary hyperparathyroidism of renal origin: Secondary | ICD-10-CM | POA: Diagnosis not present

## 2018-05-13 DIAGNOSIS — Z992 Dependence on renal dialysis: Secondary | ICD-10-CM | POA: Diagnosis not present

## 2018-05-15 DIAGNOSIS — Z992 Dependence on renal dialysis: Secondary | ICD-10-CM | POA: Diagnosis not present

## 2018-05-15 DIAGNOSIS — N186 End stage renal disease: Secondary | ICD-10-CM | POA: Diagnosis not present

## 2018-05-17 DIAGNOSIS — N186 End stage renal disease: Secondary | ICD-10-CM | POA: Diagnosis not present

## 2018-05-17 DIAGNOSIS — Z992 Dependence on renal dialysis: Secondary | ICD-10-CM | POA: Diagnosis not present

## 2018-05-20 DIAGNOSIS — Z992 Dependence on renal dialysis: Secondary | ICD-10-CM | POA: Diagnosis not present

## 2018-05-20 DIAGNOSIS — N186 End stage renal disease: Secondary | ICD-10-CM | POA: Diagnosis not present

## 2018-05-20 DIAGNOSIS — N2581 Secondary hyperparathyroidism of renal origin: Secondary | ICD-10-CM | POA: Diagnosis not present

## 2018-05-20 DIAGNOSIS — D509 Iron deficiency anemia, unspecified: Secondary | ICD-10-CM | POA: Diagnosis not present

## 2018-05-22 ENCOUNTER — Ambulatory Visit (INDEPENDENT_AMBULATORY_CARE_PROVIDER_SITE_OTHER): Payer: Medicare HMO | Admitting: *Deleted

## 2018-05-22 DIAGNOSIS — N186 End stage renal disease: Secondary | ICD-10-CM | POA: Diagnosis not present

## 2018-05-22 DIAGNOSIS — Z992 Dependence on renal dialysis: Secondary | ICD-10-CM | POA: Diagnosis not present

## 2018-05-22 DIAGNOSIS — I4891 Unspecified atrial fibrillation: Secondary | ICD-10-CM

## 2018-05-22 DIAGNOSIS — Z5181 Encounter for therapeutic drug level monitoring: Secondary | ICD-10-CM | POA: Diagnosis not present

## 2018-05-22 LAB — POCT INR: INR: 2.1 (ref 2.0–3.0)

## 2018-05-22 NOTE — Patient Instructions (Signed)
Continue coumadin 1 1/2 tablets daily except 2 tablets on Mondays  Recheck in 4 weeks Dialysis M,W,F

## 2018-05-24 DIAGNOSIS — N186 End stage renal disease: Secondary | ICD-10-CM | POA: Diagnosis not present

## 2018-05-24 DIAGNOSIS — Z992 Dependence on renal dialysis: Secondary | ICD-10-CM | POA: Diagnosis not present

## 2018-05-27 DIAGNOSIS — N186 End stage renal disease: Secondary | ICD-10-CM | POA: Diagnosis not present

## 2018-05-27 DIAGNOSIS — Z992 Dependence on renal dialysis: Secondary | ICD-10-CM | POA: Diagnosis not present

## 2018-05-27 DIAGNOSIS — N2581 Secondary hyperparathyroidism of renal origin: Secondary | ICD-10-CM | POA: Diagnosis not present

## 2018-05-29 DIAGNOSIS — Z992 Dependence on renal dialysis: Secondary | ICD-10-CM | POA: Diagnosis not present

## 2018-05-29 DIAGNOSIS — N186 End stage renal disease: Secondary | ICD-10-CM | POA: Diagnosis not present

## 2018-05-31 DIAGNOSIS — N186 End stage renal disease: Secondary | ICD-10-CM | POA: Diagnosis not present

## 2018-05-31 DIAGNOSIS — Z992 Dependence on renal dialysis: Secondary | ICD-10-CM | POA: Diagnosis not present

## 2018-06-03 DIAGNOSIS — D631 Anemia in chronic kidney disease: Secondary | ICD-10-CM | POA: Diagnosis not present

## 2018-06-03 DIAGNOSIS — Z992 Dependence on renal dialysis: Secondary | ICD-10-CM | POA: Diagnosis not present

## 2018-06-03 DIAGNOSIS — N186 End stage renal disease: Secondary | ICD-10-CM | POA: Diagnosis not present

## 2018-06-05 DIAGNOSIS — Z992 Dependence on renal dialysis: Secondary | ICD-10-CM | POA: Diagnosis not present

## 2018-06-05 DIAGNOSIS — N186 End stage renal disease: Secondary | ICD-10-CM | POA: Diagnosis not present

## 2018-06-06 DIAGNOSIS — Z992 Dependence on renal dialysis: Secondary | ICD-10-CM | POA: Diagnosis not present

## 2018-06-06 DIAGNOSIS — N186 End stage renal disease: Secondary | ICD-10-CM | POA: Diagnosis not present

## 2018-06-07 DIAGNOSIS — N186 End stage renal disease: Secondary | ICD-10-CM | POA: Diagnosis not present

## 2018-06-07 DIAGNOSIS — Z992 Dependence on renal dialysis: Secondary | ICD-10-CM | POA: Diagnosis not present

## 2018-06-10 DIAGNOSIS — N2581 Secondary hyperparathyroidism of renal origin: Secondary | ICD-10-CM | POA: Diagnosis not present

## 2018-06-10 DIAGNOSIS — N186 End stage renal disease: Secondary | ICD-10-CM | POA: Diagnosis not present

## 2018-06-10 DIAGNOSIS — Z992 Dependence on renal dialysis: Secondary | ICD-10-CM | POA: Diagnosis not present

## 2018-06-12 DIAGNOSIS — Z992 Dependence on renal dialysis: Secondary | ICD-10-CM | POA: Diagnosis not present

## 2018-06-12 DIAGNOSIS — N186 End stage renal disease: Secondary | ICD-10-CM | POA: Diagnosis not present

## 2018-06-14 DIAGNOSIS — N186 End stage renal disease: Secondary | ICD-10-CM | POA: Diagnosis not present

## 2018-06-14 DIAGNOSIS — Z992 Dependence on renal dialysis: Secondary | ICD-10-CM | POA: Diagnosis not present

## 2018-06-17 DIAGNOSIS — N186 End stage renal disease: Secondary | ICD-10-CM | POA: Diagnosis not present

## 2018-06-17 DIAGNOSIS — D509 Iron deficiency anemia, unspecified: Secondary | ICD-10-CM | POA: Diagnosis not present

## 2018-06-17 DIAGNOSIS — N2581 Secondary hyperparathyroidism of renal origin: Secondary | ICD-10-CM | POA: Diagnosis not present

## 2018-06-17 DIAGNOSIS — Z992 Dependence on renal dialysis: Secondary | ICD-10-CM | POA: Diagnosis not present

## 2018-06-19 DIAGNOSIS — Z992 Dependence on renal dialysis: Secondary | ICD-10-CM | POA: Diagnosis not present

## 2018-06-19 DIAGNOSIS — N186 End stage renal disease: Secondary | ICD-10-CM | POA: Diagnosis not present

## 2018-06-21 DIAGNOSIS — Z992 Dependence on renal dialysis: Secondary | ICD-10-CM | POA: Diagnosis not present

## 2018-06-21 DIAGNOSIS — N186 End stage renal disease: Secondary | ICD-10-CM | POA: Diagnosis not present

## 2018-06-24 ENCOUNTER — Ambulatory Visit (INDEPENDENT_AMBULATORY_CARE_PROVIDER_SITE_OTHER): Payer: Medicare HMO | Admitting: *Deleted

## 2018-06-24 DIAGNOSIS — Z5181 Encounter for therapeutic drug level monitoring: Secondary | ICD-10-CM

## 2018-06-24 DIAGNOSIS — I4891 Unspecified atrial fibrillation: Secondary | ICD-10-CM

## 2018-06-24 DIAGNOSIS — Z992 Dependence on renal dialysis: Secondary | ICD-10-CM | POA: Diagnosis not present

## 2018-06-24 DIAGNOSIS — N186 End stage renal disease: Secondary | ICD-10-CM | POA: Diagnosis not present

## 2018-06-24 LAB — POCT INR: INR: 2.1 (ref 2.0–3.0)

## 2018-06-24 NOTE — Patient Instructions (Signed)
Continue coumadin 1 1/2 tablets daily except 2 tablets on Mondays  Recheck in 6 weeks Dialysis M,W,F

## 2018-06-26 DIAGNOSIS — N186 End stage renal disease: Secondary | ICD-10-CM | POA: Diagnosis not present

## 2018-06-26 DIAGNOSIS — Z992 Dependence on renal dialysis: Secondary | ICD-10-CM | POA: Diagnosis not present

## 2018-06-28 DIAGNOSIS — N186 End stage renal disease: Secondary | ICD-10-CM | POA: Diagnosis not present

## 2018-06-28 DIAGNOSIS — Z992 Dependence on renal dialysis: Secondary | ICD-10-CM | POA: Diagnosis not present

## 2018-07-01 DIAGNOSIS — D631 Anemia in chronic kidney disease: Secondary | ICD-10-CM | POA: Diagnosis not present

## 2018-07-01 DIAGNOSIS — Z992 Dependence on renal dialysis: Secondary | ICD-10-CM | POA: Diagnosis not present

## 2018-07-01 DIAGNOSIS — N186 End stage renal disease: Secondary | ICD-10-CM | POA: Diagnosis not present

## 2018-07-01 DIAGNOSIS — N2581 Secondary hyperparathyroidism of renal origin: Secondary | ICD-10-CM | POA: Diagnosis not present

## 2018-07-03 DIAGNOSIS — Z992 Dependence on renal dialysis: Secondary | ICD-10-CM | POA: Diagnosis not present

## 2018-07-03 DIAGNOSIS — N186 End stage renal disease: Secondary | ICD-10-CM | POA: Diagnosis not present

## 2018-07-05 DIAGNOSIS — Z992 Dependence on renal dialysis: Secondary | ICD-10-CM | POA: Diagnosis not present

## 2018-07-05 DIAGNOSIS — N186 End stage renal disease: Secondary | ICD-10-CM | POA: Diagnosis not present

## 2018-07-06 ENCOUNTER — Other Ambulatory Visit: Payer: Self-pay | Admitting: Cardiovascular Disease

## 2018-07-06 DIAGNOSIS — Z992 Dependence on renal dialysis: Secondary | ICD-10-CM | POA: Diagnosis not present

## 2018-07-06 DIAGNOSIS — N186 End stage renal disease: Secondary | ICD-10-CM | POA: Diagnosis not present

## 2018-07-08 DIAGNOSIS — N186 End stage renal disease: Secondary | ICD-10-CM | POA: Diagnosis not present

## 2018-07-08 DIAGNOSIS — Z992 Dependence on renal dialysis: Secondary | ICD-10-CM | POA: Diagnosis not present

## 2018-07-10 DIAGNOSIS — Z992 Dependence on renal dialysis: Secondary | ICD-10-CM | POA: Diagnosis not present

## 2018-07-10 DIAGNOSIS — N186 End stage renal disease: Secondary | ICD-10-CM | POA: Diagnosis not present

## 2018-07-12 DIAGNOSIS — Z992 Dependence on renal dialysis: Secondary | ICD-10-CM | POA: Diagnosis not present

## 2018-07-12 DIAGNOSIS — N186 End stage renal disease: Secondary | ICD-10-CM | POA: Diagnosis not present

## 2018-07-15 ENCOUNTER — Other Ambulatory Visit: Payer: Self-pay

## 2018-07-15 ENCOUNTER — Encounter (HOSPITAL_COMMUNITY): Payer: Self-pay | Admitting: Emergency Medicine

## 2018-07-15 ENCOUNTER — Emergency Department (HOSPITAL_COMMUNITY): Payer: Medicare HMO

## 2018-07-15 ENCOUNTER — Emergency Department (HOSPITAL_COMMUNITY)
Admission: EM | Admit: 2018-07-15 | Discharge: 2018-07-15 | Disposition: A | Discharge status: Home / Self Care | Payer: Medicare HMO | Attending: Emergency Medicine | Admitting: Emergency Medicine

## 2018-07-15 DIAGNOSIS — M79602 Pain in left arm: Secondary | ICD-10-CM | POA: Diagnosis not present

## 2018-07-15 DIAGNOSIS — S4992XA Unspecified injury of left shoulder and upper arm, initial encounter: Secondary | ICD-10-CM | POA: Diagnosis not present

## 2018-07-15 DIAGNOSIS — N2581 Secondary hyperparathyroidism of renal origin: Secondary | ICD-10-CM | POA: Diagnosis not present

## 2018-07-15 DIAGNOSIS — S59902A Unspecified injury of left elbow, initial encounter: Secondary | ICD-10-CM | POA: Diagnosis not present

## 2018-07-15 DIAGNOSIS — S40812A Abrasion of left upper arm, initial encounter: Secondary | ICD-10-CM | POA: Diagnosis not present

## 2018-07-15 DIAGNOSIS — T148XXA Other injury of unspecified body region, initial encounter: Secondary | ICD-10-CM

## 2018-07-15 DIAGNOSIS — S4991XA Unspecified injury of right shoulder and upper arm, initial encounter: Secondary | ICD-10-CM | POA: Diagnosis not present

## 2018-07-15 DIAGNOSIS — S4982XA Other specified injuries of left shoulder and upper arm, initial encounter: Secondary | ICD-10-CM | POA: Diagnosis present

## 2018-07-15 DIAGNOSIS — N186 End stage renal disease: Secondary | ICD-10-CM | POA: Diagnosis not present

## 2018-07-15 DIAGNOSIS — W01190A Fall on same level from slipping, tripping and stumbling with subsequent striking against furniture, initial encounter: Secondary | ICD-10-CM | POA: Diagnosis not present

## 2018-07-15 DIAGNOSIS — Y999 Unspecified external cause status: Secondary | ICD-10-CM | POA: Insufficient documentation

## 2018-07-15 DIAGNOSIS — Z87891 Personal history of nicotine dependence: Secondary | ICD-10-CM | POA: Diagnosis not present

## 2018-07-15 DIAGNOSIS — Y92019 Unspecified place in single-family (private) house as the place of occurrence of the external cause: Secondary | ICD-10-CM | POA: Insufficient documentation

## 2018-07-15 DIAGNOSIS — D509 Iron deficiency anemia, unspecified: Secondary | ICD-10-CM | POA: Diagnosis not present

## 2018-07-15 DIAGNOSIS — W19XXXA Unspecified fall, initial encounter: Secondary | ICD-10-CM

## 2018-07-15 DIAGNOSIS — Y939 Activity, unspecified: Secondary | ICD-10-CM | POA: Insufficient documentation

## 2018-07-15 DIAGNOSIS — M25522 Pain in left elbow: Secondary | ICD-10-CM | POA: Diagnosis not present

## 2018-07-15 DIAGNOSIS — Z992 Dependence on renal dialysis: Secondary | ICD-10-CM | POA: Diagnosis not present

## 2018-07-15 NOTE — ED Provider Notes (Signed)
Emergency Department Provider Note   I have reviewed the triage vital signs and the nursing notes.   HISTORY  Chief Complaint Fall   HPI Sabrina Beck is a 77 y.o. female with PMH of A-fib on Coumadin, CHF, ESRD with HD today presents to the emergency department for evaluation after mechanical fall.  Patient states that she was at home this morning when she lost her balance and fell backwards and struck her left arm on a desk.  She did not strike her head or lose consciousness.  She denies any chest pain, heart palpitations, lightheadedness prior to falling.  She had some skin tear and bleeding at the time but placed bandages and presented to dialysis.  She had a full run of dialysis and was referred to the emergency department after her treatment.  She denies any tingling, numbness in the left upper extremity.  No shoulder discomfort, leg discomfort.  Pain in the arm is worse with trying to use it to stand up and with movement or touching the area.  The arm has not continued to bleed.  Past Medical History:  Diagnosis Date  . Anemia 03/27/2011  . Atrial fibrillation ([place])   . Cancer ([place])    uterine  . Chest pain 05/15/2006   stress test perform negative for ischemia  . CHF (congestive heart failure) ([place])   . Complication of anesthesia   . Diverticulosis   . Dysrhythmia    afib  . Family history of anesthesia complication    "brother has nausea when put to sleep."  . GERD (gastroesophageal reflux disease)   . Headache   . Kidney disease    stage 4 chronic  . Mixed hyperlipidemia   . Murmur 06/04/2006,09/29/10   2D Echo perform both test shows EF 55%  . Peripheral neuropathy   . PONV (postoperative nausea and vomiting)   . Renal disorder   . Shortness of breath    with exertion  . Systemic hypertension     Patient Active Problem List   Diagnosis Date Noted  . ESRD (end stage renal disease) ([place]) 05/19/2014  . Complications due to renal dialysis device, implant,  and graft 05/14/2014  . Abscess re-check 01/19/2014  . Breast abscess of female 01/19/2014  . Chronic diastolic CHF (congestive heart failure) ([place]) 12/25/2013  . Dyspnea 12/21/2013  . CHF (congestive heart failure) ([place]) 12/21/2013  . End stage renal disease ([place]) 11/06/2013  . CKD (chronic kidney disease) stage 5, GFR less than 15 ml/min ([facility]) 10/21/2013  . HTN (hypertension) 10/21/2013  . Mixed hyperlipidemia 10/21/2013  . Encounter for therapeutic drug monitoring 09/03/2013  . Atrial fibrillation ([place]) 10/11/2012  . Long term current use of anticoagulant therapy 10/11/2012  . Anemia 03/27/2011    Past Surgical History:  Procedure Laterality Date  . ABDOMINAL HYSTERECTOMY    . APPENDECTOMY    . AV FISTULA PLACEMENT Left 11/17/2013   Procedure: CREATION OF LEFT RADIOCEPHALIC ARTERIOVENOUS (AV) FISTULA ;  Surgeon: [name], MD;  Location: [facility];  Service: Vascular;  Laterality: Left;  . BREAST SURGERY Left    tumors non canerous 2- 3 different surgeries  . CATARACT EXTRACTION W/PHACO Right 10/26/2016   Procedure: CATARACT EXTRACTION PHACO AND INTRAOCULAR LENS PLACEMENT (IOC) CDE= 11.91;  Surgeon: Tonny Branch, MD;  Location: [facility];  Service: Ophthalmology;  Laterality: Right;  right - pt knows to arrive at 6:30  . CATARACT EXTRACTION W/PHACO Left 11/30/2016   Procedure: CATARACT EXTRACTION PHACO AND INTRAOCULAR LENS  PLACEMENT (IOC) CDE - 7.92 ;  Surgeon: Tonny Branch, MD;  Location[facility];  Service: Ophthalmology;  Laterality: Left;  left  . CHOLECYSTECTOMY    . COLONOSCOPY W/ BIOPSIES AND POLYPECTOMY    . FISTULA SUPERFICIALIZATION Left 05/19/2014   Procedure: FISTULA SUPERFICIALIZATION-LEFT ARM;  Surgeon: [name], MD;  Location: [facility];  Service: Vascular;  Laterality: Left;  . INSERTION OF DIALYSIS CATHETER N/A 01/24/2014   Procedure: INSERTION OF DIALYSIS CATHETER;  Surgeon: Rosetta Posner, MD;  [facility]: [facility];  Service: Vascular;  Laterality: N/A;  . KNEE ARTHROSCOPY     . TUBAL LIGATION     Allergies Patient has no known allergies.  Family History  Problem Relation Age of Onset  . Diabetes Mother   . Hypertension Mother   . Diabetes Father   . Heart disease Father        before age 25  . Heart attack Father   . Diabetes Sister   . Hyperlipidemia Sister   . Hypertension Sister   . Cancer Brother   . Diabetes Brother   . Heart disease Brother   . Hypertension Brother   . Heart attack Brother     Social History Social History   Tobacco Use  . Smoking status: Former Smoker    Last attempt to quit: 08/08/1999    Years since quitting: 18.9  . Smokeless tobacco: Never Used  Substance Use Topics  . Alcohol use: No    Alcohol/week: 0.0 standard drinks  . Drug use: No    Review of Systems  Constitutional: No fever/chills Eyes: No visual changes. ENT: No sore throat. Cardiovascular: Denies chest pain. Respiratory: Denies shortness of breath. Gastrointestinal: No abdominal pain.  No nausea, no vomiting.  No diarrhea.  No constipation. Genitourinary: Negative for dysuria. Musculoskeletal: Negative for back pain. Positive left arm pain.  Skin: Positive left arm skin tare.  Neurological: Negative for headaches, focal weakness or numbness.  10-point ROS otherwise negative.  ____________________________________________   PHYSICAL EXAM:  VITAL SIGNS: ED Triage Vitals  Enc Vitals Group     BP 07/15/18 1103 (!) 101/51     Pulse Rate 07/15/18 1103 97     Resp 07/15/18 1103 20     Temp 07/15/18 1102 (!) 97.5 F (36.4 C)     Temp Source 07/15/18 1102 Oral     SpO2 07/15/18 1103 95 %     Weight 07/15/18 1104 215 lb (97.5 kg)     Height 07/15/18 1104 5\' 4"  (1.626 m)     Pain Score 07/15/18 1103 0   Constitutional: Alert and oriented. Well appearing and in no acute distress. Eyes: Conjunctivae are normal.  Head: Atraumatic. Nose: No congestion/rhinnorhea. Mouth/Throat: Mucous membranes are moist.   Neck: No stridor.    Cardiovascular: Normal rate, regular rhythm. Respiratory: Normal respiratory effort.  Gastrointestinal:  No distention.  Musculoskeletal: No lower extremity tenderness nor edema. Left posterior upper arm bruising and blistering. Skin abrasion and skin tare to the upper, posterior, arm. No laceration.  Neurologic:  Normal speech and language. No gross focal neurologic deficits are appreciated.  Skin:  Skin is warm and dry. Upper arm compartment soft.  ____________________________________________  RADIOLOGY  Dg Elbow Complete Left  Result Date: 07/15/2018 CLINICAL DATA:  Pain secondary to a fall this morning. EXAM: LEFT ELBOW - COMPLETE 3+ VIEW COMPARISON:  None. FINDINGS: There is no fracture or dislocation. No definitive joint effusion. Slight degenerative spurring of coronoid process of the proximal ulna. Posterior  subcutaneous edema. IMPRESSION: No acute bone abnormality.  Soft tissue swelling. Electronically Signed   By: Lorriane Shire M.D.   On: 07/15/2018 11:52   Dg Humerus Left  Result Date: 07/15/2018 CLINICAL DATA:  Left upper arm pain secondary to a fall this morning. EXAM: LEFT HUMERUS - 2+ VIEW COMPARISON:  None. FINDINGS: There is no evidence of fracture or other focal bone lesions. Soft tissues are unremarkable. IMPRESSION: Negative. Electronically Signed   By: Lorriane Shire M.D.   On: 07/15/2018 11:50    ____________________________________________   PROCEDURES  Procedure(s) performed:   Procedures  None ____________________________________________   INITIAL IMPRESSION / ASSESSMENT AND PLAN / ED COURSE  Pertinent labs & imaging results that were available during my care of the patient were reviewed by me and considered in my medical decision making (see chart for details).  Patient presents to the emergency department for evaluation after fall this morning.  She has a small skin tear to the posterior left arm.  No laceration.  No concern for compartment syndrome  or active bleeding.  She has full range of motion of the left elbow, shoulder, wrist with only mild tenderness.  Plain films reviewed with no acute bony abnormalities.  I dressed the wound with nonstick dressing and taught patient how to do this at home.  Advise 2 to 3 days of dressing with PCP follow-up for continued wound care although do not suspect serious complications from this injury.  She had dialysis today.  Plan for discharge.  Tetanus is up-to-date. Discussed ED return precautions.    ____________________________________________  FINAL CLINICAL IMPRESSION(S) / ED DIAGNOSES  Final diagnoses:  Injury of left upper arm, initial encounter  Fall, initial encounter  Abrasion of skin    Note:  This document was prepared using Dragon voice recognition software and may include unintentional dictation errors.  Nanda Quinton, MD Emergency Medicine    Long, [facility], MD 07/15/18 [phone]

## 2018-07-15 NOTE — ED Triage Notes (Signed)
Patient reports falling around 0430 today. Did not hit her head, no loss of consciousness. C/O L upper arm pain. Patient had dialysis today.

## 2018-07-15 NOTE — Discharge Instructions (Signed)
You were seen in the ED today after a fall. You have an arm abrasion which will require dressing changes for the next 2-3 days. Use a non-stick gauze at the skin surface and then wrap gently to hold in place. Return to the ED with sudden worsening arm pain, fever, chills, or numbness.

## 2018-07-17 DIAGNOSIS — Z992 Dependence on renal dialysis: Secondary | ICD-10-CM | POA: Diagnosis not present

## 2018-07-17 DIAGNOSIS — N186 End stage renal disease: Secondary | ICD-10-CM | POA: Diagnosis not present

## 2018-07-19 ENCOUNTER — Ambulatory Visit (INDEPENDENT_AMBULATORY_CARE_PROVIDER_SITE_OTHER): Payer: Medicare HMO | Admitting: Family

## 2018-07-19 ENCOUNTER — Encounter: Payer: Self-pay | Admitting: Family

## 2018-07-19 ENCOUNTER — Telehealth: Payer: Self-pay | Admitting: *Deleted

## 2018-07-19 ENCOUNTER — Other Ambulatory Visit: Payer: Self-pay

## 2018-07-19 VITALS — BP 125/62 | HR 100 | Temp 97.2°F | Resp 20 | Ht 64.0 in | Wt 225.0 lb

## 2018-07-19 DIAGNOSIS — S4992XD Unspecified injury of left shoulder and upper arm, subsequent encounter: Secondary | ICD-10-CM

## 2018-07-19 DIAGNOSIS — M7989 Other specified soft tissue disorders: Secondary | ICD-10-CM | POA: Diagnosis not present

## 2018-07-19 DIAGNOSIS — N186 End stage renal disease: Secondary | ICD-10-CM | POA: Diagnosis not present

## 2018-07-19 DIAGNOSIS — I770 Arteriovenous fistula, acquired: Secondary | ICD-10-CM

## 2018-07-19 DIAGNOSIS — Z992 Dependence on renal dialysis: Secondary | ICD-10-CM | POA: Diagnosis not present

## 2018-07-19 NOTE — Telephone Encounter (Signed)
Call from [place] at [facility]. Requesting patient have HD access arm checked out today if possible. Patient fell on left arm on 07/15/2018 went to [facility] and had it X-rayed, but has had increase in swelling, shoulder to hand. They are using the access today for HD without any problems but requesting patient be seen by [facility]. Agreeable to be seen by NP today. Time given.

## 2018-07-19 NOTE — Progress Notes (Signed)
Pt    Cc: Swelling in left arm after falling on left arm, s/p left forearm AV fistula placement  History of Present Illness  Sabrina Beck is a 77 y.o. ([birth year]) female who is s/p placement of a left radiocephalic fistula in April 2015 by Dr. Oneida Alar; revised this to 06/04/2014 with ligation of side branches and Superficialization.   She states they have been successfully cannulating the fistula. She still is dialyzing intermittently from a right side Diatek catheter. She denies numbness and tingling in her hand. She does have some numbness and tingling in the left forearm over the course of the fistula.  She was last evaluated on 07-16-14 by Dr. Oneida Alar. At that time there was an easily palpable thrill fistula visible throughout the forearm. Patient had a duplex ultrasound the fistula today which showed the fistula diameter is greater than 6 mm throughout most of its course and less than 6 m in depth from the skin. Mature left radiocephalic AV fistula. Pt was to follow-up as needed.  She returns today after call from [place] at [facility]. Requesting patient have HD access arm checked out today if possible. Patient fell on left arm on 07/15/2018 went to [place] and had it X-rayed, but has had increase in swelling, shoulder to hand. They are using the access today for HD without any problems but requesting patient be seen by [facility].  She takes coumadin, possibly for atrial fib.   She quit smoking in 2001.   Past Medical History:  Diagnosis Date  . Anemia 03/27/2011  . Atrial fibrillation ([place])   . Cancer ([place])    uterine  . Chest pain 05/15/2006   stress test perform negative for ischemia  . CHF (congestive heart failure) ([place])   . Complication of anesthesia   . Diverticulosis   . Dysrhythmia    afib  . Family history of anesthesia complication    "brother has nausea when put to sleep."  . GERD (gastroesophageal reflux disease)   . Headache   . Kidney disease      stage 4 chronic  . Mixed hyperlipidemia   . Murmur 06/04/2006,09/29/10   2D Echo perform both test shows EF 55%  . Peripheral neuropathy   . PONV (postoperative nausea and vomiting)   . Renal disorder   . Shortness of breath    with exertion  . Systemic hypertension     Social History Social History   Tobacco Use  . Smoking status: Former Smoker    Last attempt to quit: 08/08/1999    Years since quitting: 18.9  . Smokeless tobacco: Never Used  Substance Use Topics  . Alcohol use: No    Alcohol/week: 0.0 standard drinks  . Drug use: No    Family History Family History  Problem Relation Age of Onset  . Diabetes Mother   . Hypertension Mother   . Diabetes Father   . Heart disease Father        before age 73  . Heart attack Father   . Diabetes Sister   . Hyperlipidemia Sister   . Hypertension Sister   . Cancer Brother   . Diabetes Brother   . Heart disease Brother   . Hypertension Brother   . Heart attack Brother     Surgical History Past Surgical History:  Procedure Laterality Date  . ABDOMINAL HYSTERECTOMY    . APPENDECTOMY    . AV FISTULA PLACEMENT Left 11/17/2013   Procedure: CREATION OF LEFT  RADIOCEPHALIC ARTERIOVENOUS (AV) FISTULA ;  Surgeon: [name], MD;  Location: [facility];  Service: Vascular;  Laterality: Left;  . BREAST SURGERY Left    tumors non canerous 2- 3 different surgeries  . CATARACT EXTRACTION W/PHACO Right 10/26/2016   Procedure: CATARACT EXTRACTION PHACO AND INTRAOCULAR LENS PLACEMENT (IOC) CDE= 11.91;  Surgeon: Tonny Branch, MD;  Location: [facility];  Service: Ophthalmology;  Laterality: Right;  right - pt knows to arrive at 6:30  . CATARACT EXTRACTION W/PHACO Left 11/30/2016   Procedure: CATARACT EXTRACTION PHACO AND INTRAOCULAR LENS PLACEMENT (IOC) CDE - 7.92 ;  Surgeon: Tonny Branch, MD;  Location: [facility];  Service: Ophthalmology;  Laterality: Left;  left  . CHOLECYSTECTOMY    . COLONOSCOPY W/ BIOPSIES AND POLYPECTOMY    . FISTULA  SUPERFICIALIZATION Left 05/19/2014   Procedure: FISTULA SUPERFICIALIZATION-LEFT ARM;  Surgeon: [name], MD;  Location: [place];  Service: Vascular;  Laterality: Left;  . INSERTION OF DIALYSIS CATHETER N/A 01/24/2014   Procedure: INSERTION OF DIALYSIS CATHETER;  Surgeon: Rosetta Posner, MD;  [facility];  Service: Vascular;  Laterality: N/A;  . KNEE ARTHROSCOPY    . TUBAL LIGATION      No Known Allergies  Current Outpatient Medications  Medication Sig Dispense Refill  . acetaminophen (TYLENOL) 500 MG tablet Take 1,000 mg by mouth every 6 (six) hours as needed for moderate pain.     . calcium acetate (PHOSLO) 667 MG capsule Take 667-2,001 capsules by mouth See admin instructions. Takes 1 capsule with snacks and 3 capsules with meals    . Cholecalciferol (VITAMIN D) 2000 UNITS CAPS Take 2,000 Units by mouth daily.     . metoprolol tartrate (LOPRESSOR) 25 MG tablet Take 1-2 tablets (25-50 mg total) by mouth 2 (two) times daily. Take 1-2 tablets (25-50 mg total) by mouth twice daily as directed. 200 tablet 3  . Multiple Vitamins-Minerals (PRESERVISION AREDS 2 PO) Take 1 tablet by mouth 2 (two) times daily.    . multivitamin (RENA-VIT) TABS tablet Take 1 tablet by mouth daily.    . Omega-3 Fatty Acids (OMEGA-3 FISH OIL) 300 MG CAPS Take 300 mg by mouth 2 (two) times daily.    Marland Kitchen omeprazole (PRILOSEC) 20 MG capsule Take 20 mg by mouth daily.    Marland Kitchen Propylene Glycol (SYSTANE BALANCE) 0.6 % SOLN Place 1 drop into both eyes 3 (three) times daily as needed (for burning or dry eyes).     . RENVELA 800 MG tablet Take 800-1,600 mg by mouth See admin instructions. Takes 1 tablet with snacks and 2 tablets with meals    . warfarin (COUMADIN) 5 MG tablet Take 1.5 to 2 tablets by mouth daily as directed by coumadin clinic 150 tablet 1   No current facility-administered medications for this visit.      REVIEW OF SYSTEMS: see HPI for pertinent positives and negatives    PHYSICAL  EXAMINATION:  Vitals:   07/19/18 1039  BP: 125/62  Pulse: 100  Resp: 20  Temp: (!) 97.2 F (36.2 C)  SpO2: 100%  Weight: 225 lb (102.1 kg)  Height: 5\' 4"  (1.626 m)   Body mass index is 38.62 kg/m.  General: The patient appears their stated age.   HEENT:  No gross abnormalities Pulmonary: Respirations are non-labored Abdomen: Soft and non-tender with. Musculoskeletal: There are no major deformities.   Neurologic: No focal weakness or paresthesias are detected, Skin: There are no ulcer or rashes noted. Psychiatric: The patient has normal  affect. Cardiovascular: There is a regular rate and rhythm without significant murmur appreciated.         Medical Decision Making  LULABELLE DESTA is a 77 y.o. female who is s/p placement of a left radiocephalic fistula in April 2015 by Dr. Oneida Alar; revised this to 06/04/2014 with ligation of side branches and Superficialization.  She fell on her left arm on 07-15-18, incurred shallow lacerations and skin tears at left elbow and posterior aspect of left upper arm. No signs of infection.  She is on coumadin for atrial fib, which encourages bleeding at laceration sites. She has moderate swelling in he left hand and forearm, has not been elevating her left hand above her heart.   Dr. Donzetta Matters spoke with pt and son, and examined pt.  Access is working well in HD according to pt.  She will need to elevated her left arm above her heart as often as possible to reduce swelling, exercise her left hand to promote arterial perfusion. Daily local wound care and dressing changes to lacerations at posterior left upper arm, prevent infection until healed.   Follow up with [place] if she needs a temporary access, or different permanent access site, or as otherwise needed.    Clemon Chambers, RN, MSN, FNP-C [facility] Office: [phone]  07/19/2018, 11:08 AM  Clinic MD: Donzetta Matters

## 2018-07-22 DIAGNOSIS — Z992 Dependence on renal dialysis: Secondary | ICD-10-CM | POA: Diagnosis not present

## 2018-07-22 DIAGNOSIS — N186 End stage renal disease: Secondary | ICD-10-CM | POA: Diagnosis not present

## 2018-07-24 DIAGNOSIS — Z992 Dependence on renal dialysis: Secondary | ICD-10-CM | POA: Diagnosis not present

## 2018-07-24 DIAGNOSIS — N186 End stage renal disease: Secondary | ICD-10-CM | POA: Diagnosis not present

## 2018-07-26 DIAGNOSIS — D631 Anemia in chronic kidney disease: Secondary | ICD-10-CM | POA: Diagnosis not present

## 2018-07-26 DIAGNOSIS — N186 End stage renal disease: Secondary | ICD-10-CM | POA: Diagnosis not present

## 2018-07-26 DIAGNOSIS — Z992 Dependence on renal dialysis: Secondary | ICD-10-CM | POA: Diagnosis not present

## 2018-07-28 DIAGNOSIS — N186 End stage renal disease: Secondary | ICD-10-CM | POA: Diagnosis not present

## 2018-07-28 DIAGNOSIS — Z992 Dependence on renal dialysis: Secondary | ICD-10-CM | POA: Diagnosis not present

## 2018-07-30 DIAGNOSIS — N186 End stage renal disease: Secondary | ICD-10-CM | POA: Diagnosis not present

## 2018-07-30 DIAGNOSIS — Z992 Dependence on renal dialysis: Secondary | ICD-10-CM | POA: Diagnosis not present

## 2018-08-02 DIAGNOSIS — Z992 Dependence on renal dialysis: Secondary | ICD-10-CM | POA: Diagnosis not present

## 2018-08-02 DIAGNOSIS — N186 End stage renal disease: Secondary | ICD-10-CM | POA: Diagnosis not present

## 2018-08-05 ENCOUNTER — Ambulatory Visit (INDEPENDENT_AMBULATORY_CARE_PROVIDER_SITE_OTHER): Payer: Medicare HMO | Admitting: *Deleted

## 2018-08-05 DIAGNOSIS — Z992 Dependence on renal dialysis: Secondary | ICD-10-CM | POA: Diagnosis not present

## 2018-08-05 DIAGNOSIS — Z5181 Encounter for therapeutic drug level monitoring: Secondary | ICD-10-CM | POA: Diagnosis not present

## 2018-08-05 DIAGNOSIS — I4891 Unspecified atrial fibrillation: Secondary | ICD-10-CM | POA: Diagnosis not present

## 2018-08-05 DIAGNOSIS — N186 End stage renal disease: Secondary | ICD-10-CM | POA: Diagnosis not present

## 2018-08-05 LAB — POCT INR: INR: 2 (ref 2.0–3.0)

## 2018-08-05 NOTE — Patient Instructions (Signed)
Increase coumadin to 1 1/2 tablets daily except 2 tablets on Mondays and Thursdays Recheck in 6 weeks Dialysis M,W,F

## 2018-08-06 DIAGNOSIS — N186 End stage renal disease: Secondary | ICD-10-CM | POA: Diagnosis not present

## 2018-08-06 DIAGNOSIS — Z992 Dependence on renal dialysis: Secondary | ICD-10-CM | POA: Diagnosis not present

## 2018-08-07 DIAGNOSIS — Z992 Dependence on renal dialysis: Secondary | ICD-10-CM | POA: Diagnosis not present

## 2018-08-07 DIAGNOSIS — N186 End stage renal disease: Secondary | ICD-10-CM | POA: Diagnosis not present

## 2018-08-09 DIAGNOSIS — Z992 Dependence on renal dialysis: Secondary | ICD-10-CM | POA: Diagnosis not present

## 2018-08-09 DIAGNOSIS — N186 End stage renal disease: Secondary | ICD-10-CM | POA: Diagnosis not present

## 2018-08-12 DIAGNOSIS — N186 End stage renal disease: Secondary | ICD-10-CM | POA: Diagnosis not present

## 2018-08-12 DIAGNOSIS — Z992 Dependence on renal dialysis: Secondary | ICD-10-CM | POA: Diagnosis not present

## 2018-08-14 ENCOUNTER — Ambulatory Visit (INDEPENDENT_AMBULATORY_CARE_PROVIDER_SITE_OTHER): Payer: Medicare HMO | Admitting: Physician Assistant

## 2018-08-14 ENCOUNTER — Other Ambulatory Visit: Payer: Self-pay

## 2018-08-14 VITALS — BP 135/87 | HR 99 | Temp 97.4°F | Resp 16 | Ht 64.0 in

## 2018-08-14 DIAGNOSIS — Z992 Dependence on renal dialysis: Secondary | ICD-10-CM | POA: Diagnosis not present

## 2018-08-14 DIAGNOSIS — N186 End stage renal disease: Secondary | ICD-10-CM | POA: Diagnosis not present

## 2018-08-14 NOTE — Progress Notes (Signed)
Established Dialysis Access   History of Present Illness   Sabrina Beck is a 78 y.o. (January 10, 1941) female who presents for re-evaluation of permanent access.  She is status post left arm radiocephalic fistula in 6433 by Dr. Oneida Alar with subsequent superficialization.  Patient was seen in clinic about 3 to 4 weeks ago after sustaining a fall onto her left arm.  At that time left arm fistula appeared to be unaffected except for some mild swelling.  Patient states that she has been dialyzing without complication from her fistula since that time.  She returns office today with concern about some thinning skin overlying repeated stick sites.  She denies any bleeding episodes.  She dialyzes under the care of Dr. Lowanda Foster on a Tuesday Thursday Saturday schedule in [place].  She is taking Coumadin for atrial fibrillation.     Current Outpatient Medications  Medication Sig Dispense Refill  . acetaminophen (TYLENOL) 500 MG tablet Take 1,000 mg by mouth every 6 (six) hours as needed for moderate pain.     . calcium acetate (PHOSLO) 667 MG capsule Take 667-2,001 capsules by mouth See admin instructions. Takes 1 capsule with snacks and 3 capsules with meals    . Cholecalciferol (VITAMIN D) 2000 UNITS CAPS Take 2,000 Units by mouth daily.     . metoprolol tartrate (LOPRESSOR) 25 MG tablet Take 1-2 tablets (25-50 mg total) by mouth 2 (two) times daily. Take 1-2 tablets (25-50 mg total) by mouth twice daily as directed. 200 tablet 3  . Multiple Vitamins-Minerals (PRESERVISION AREDS 2 PO) Take 1 tablet by mouth 2 (two) times daily.    . multivitamin (RENA-VIT) TABS tablet Take by mouth.    . Omega-3 Fatty Acids (OMEGA-3 FISH OIL) 300 MG CAPS Take 300 mg by mouth 2 (two) times daily.    Marland Kitchen omeprazole (PRILOSEC) 20 MG capsule Take 20 mg by mouth daily.    Marland Kitchen Propylene Glycol (SYSTANE BALANCE) 0.6 % SOLN Place 1 drop into both eyes 3 (three) times daily as needed (for burning or dry eyes).     . RENVELA 800 MG  tablet Take 800-1,600 mg by mouth See admin instructions. Takes 1 tablet with snacks and 2 tablets with meals    . warfarin (COUMADIN) 5 MG tablet Take 1.5 to 2 tablets by mouth daily as directed by coumadin clinic 150 tablet 1  . metoprolol tartrate (LOPRESSOR) 50 MG tablet TAKE 1 TABLET BY MOUTH ONCE DAILY IN THE MORNING AND 1 2 (ONE HALF) TABLET IN THE EVENING  2   No current facility-administered medications for this visit.      Physical Examination   Vitals:   08/14/18 1607  BP: 135/87  Pulse: 99  Resp: 16  Temp: (!) 97.4 F (36.3 C)  TempSrc: Oral  SpO2: 93%  Height: 5\' 4"  (1.626 m)   Body mass index is 38.62 kg/m.  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 female who  presents with ESRD requiring hemodialysis.    Patent fistula with palpable thrill and audible bruit  Several areas of superficial thinning skin; all areas are mobile with no overt concerns for spontaneous bleeding  Discussed plication surgery and risks with the patient including probable need for tunneled dialysis catheter as well as potential need for new hemodialysis access  Encouraged patient to have dialysis techs avoid areas of most concern  Offered patient a follow-up appointment in 4 to 6 weeks to reevaluate fistula and potential need for plication surgery  Patient states she will discuss areas of concern with her dialysis center and will call our office if these areas fail to improve over the next  month  Dagoberto Ligas PA-C [facility] Office: [phone]

## 2018-08-15 ENCOUNTER — Ambulatory Visit (HOSPITAL_COMMUNITY)
Admission: RE | Admit: 2018-08-15 | Discharge: 2018-08-15 | Disposition: A | Discharge status: Home / Self Care | Payer: Medicare HMO | Source: Ambulatory Visit | Attending: Cardiovascular Disease | Admitting: Cardiovascular Disease

## 2018-08-15 ENCOUNTER — Encounter: Payer: Self-pay | Admitting: Cardiovascular Disease

## 2018-08-15 ENCOUNTER — Ambulatory Visit: Payer: Medicare HMO | Admitting: Cardiovascular Disease

## 2018-08-15 VITALS — BP 126/74 | HR 90 | Ht 64.0 in | Wt 222.4 lb

## 2018-08-15 DIAGNOSIS — R05 Cough: Secondary | ICD-10-CM | POA: Diagnosis not present

## 2018-08-15 DIAGNOSIS — Z7901 Long term (current) use of anticoagulants: Secondary | ICD-10-CM | POA: Diagnosis not present

## 2018-08-15 DIAGNOSIS — I5032 Chronic diastolic (congestive) heart failure: Secondary | ICD-10-CM

## 2018-08-15 DIAGNOSIS — J440 Chronic obstructive pulmonary disease with acute lower respiratory infection: Secondary | ICD-10-CM | POA: Diagnosis not present

## 2018-08-15 DIAGNOSIS — R0602 Shortness of breath: Secondary | ICD-10-CM | POA: Diagnosis not present

## 2018-08-15 DIAGNOSIS — J181 Lobar pneumonia, unspecified organism: Principal | ICD-10-CM

## 2018-08-15 DIAGNOSIS — J189 Pneumonia, unspecified organism: Secondary | ICD-10-CM

## 2018-08-15 DIAGNOSIS — N186 End stage renal disease: Secondary | ICD-10-CM | POA: Diagnosis not present

## 2018-08-15 DIAGNOSIS — I4821 Permanent atrial fibrillation: Secondary | ICD-10-CM | POA: Diagnosis not present

## 2018-08-15 MED ORDER — LEVOFLOXACIN 500 MG PO TABS: 500.0000 mg | ORAL_TABLET | Freq: Every day | ORAL | 0 refills | Status: DC

## 2018-08-15 NOTE — Patient Instructions (Signed)
Medication Instructions:  Dr Sallyanne Kuster has recommended making the following medication changes: 1. START Levaquin 500 mg - take 1 tablet daily for 7 days - do not take within 2 hours of your calcium acetate capsule  If you need a refill on your cardiac medications before your next appointment, please call your pharmacy.   Testing/Procedures: Dr Sallyanne Kuster has ordered a chest x-ray. This has been ordered to be completed at [facility] in [facility]. A chest x-ray takes a picture of the organs and structures inside the chest, including the heart, lungs, and blood vessels. This test can show several things, including, whether the heart is enlarges; whether fluid is building up in the lungs; and whether pacemaker / defibrillator leads are still in place.  Follow-Up: Your physician recommends that you schedule an appointment in the coumadin clinic early next week in [place].  At [facility], you and your health needs are our priority.  As part of our continuing mission to provide you with exceptional heart care, we have created designated Provider Care Teams.  These Care Teams include your primary Cardiologist (physician) and Advanced Practice Providers (APPs -  Physician Assistants and Nurse Practitioners) who all work together to provide you with the care you need, when you need it. You will need a follow up appointment in 12 months. You may see Sanda Klein, MD or one of the following Advanced Practice Providers on your designated Care Team: [place], [place] . Fabian Sharp, PA-C . You will receive a reminder letter in the mail two months in advance. If you don't receive a letter, please call our office to schedule the follow-up appointment.

## 2018-08-15 NOTE — Progress Notes (Signed)
Cardiology Office Note    Date:  08/15/2018   ID:  Sabrina Beck, DOB 05/15/1941, MRN [id number]  PCP:  Redmond School, MD  Cardiologist:   Sanda Klein, MD   Chief Complaint  Patient presents with  . Follow-up    Atrial fibrillation, CHF  . Shortness of Breath    Dyspnea and cough    History of Present Illness:  Sabrina Beck is a 78 y.o. female with permanent atrial fibrillation, diastolic heart failure and end-stage renal disease returning for follow-up.   She has accommodated well with dialysis since her last appointment.  The current tailored prescription for beta-blocker seems to offer a reasonable balance between hypotension and unpleasant tachycardia she still has occasional palpitations but these are milder.  She has not had to interrupt dialysis for hypotension.  For the last 2-3 weeks she has been ill.  She is coughing a lot and is producing yellowish sputum in large quantities.  This often wakes her up at night and then she can fall back asleep.  Not had fever or pleuritic chest pain.  There is been no hemoptysis.  She does not feel short of breath except when she has a coughing spell.  She does not have orthopnea or PND.  She has not had problems with leg edema.  Her weight has been fairly steady.  She denies exertional angina.  Events or bleeding problems.  She does have a history of smoking a pack a day for over 30 years but quit maybe as long as 20 years ago.  She does not have wheezing or chronic cough.  She is known to have a mild to moderately dilated left atrium, but otherwise no significant valvular or myocardial abnormalities on previous echoes. She has never undergone coronary angiography but had a normal nuclear stress test in 2007 and does not have angina pectoris.  Past Medical History:  Diagnosis Date  . Anemia 03/27/2011  . Atrial fibrillation ([place])   . Cancer ([place])    uterine  . Chest pain 05/15/2006   stress test perform negative for ischemia    . CHF (congestive heart failure) ([place])   . Complication of anesthesia   . Diverticulosis   . Dysrhythmia    afib  . Family history of anesthesia complication    "brother has nausea when put to sleep."  . GERD (gastroesophageal reflux disease)   . Headache   . Kidney disease    stage 4 chronic  . Mixed hyperlipidemia   . Murmur 06/04/2006,09/29/10   2D Echo perform both test shows EF 55%  . Peripheral neuropathy   . PONV (postoperative nausea and vomiting)   . Renal disorder   . Shortness of breath    with exertion  . Systemic hypertension     Past Surgical History:  Procedure Laterality Date  . ABDOMINAL HYSTERECTOMY    . APPENDECTOMY    . AV FISTULA PLACEMENT Left 11/17/2013   Procedure: CREATION OF LEFT RADIOCEPHALIC ARTERIOVENOUS (AV) FISTULA ;  Surgeon: [name], MD;  Location: [facility];  Service: Vascular;  Laterality: Left;  . BREAST SURGERY Left    tumors non canerous 2- 3 different surgeries  . CATARACT EXTRACTION W/PHACO Right 10/26/2016   Procedure: CATARACT EXTRACTION PHACO AND INTRAOCULAR LENS PLACEMENT (IOC) CDE= 11.91;  Surgeon: Tonny Branch, MD;  Location: [facility];  Service: Ophthalmology;  Laterality: Right;  right - pt knows to arrive at 6:30  . CATARACT EXTRACTION W/PHACO Left 11/30/2016  Procedure: CATARACT EXTRACTION PHACO AND INTRAOCULAR LENS PLACEMENT (IOC) CDE - 7.92 ;  Surgeon: Tonny Branch, MD;  Location: [facility];  Service: Ophthalmology;  Laterality: Left;  left  . CHOLECYSTECTOMY    . COLONOSCOPY W/ BIOPSIES AND POLYPECTOMY    . FISTULA SUPERFICIALIZATION Left 05/19/2014   Procedure: FISTULA SUPERFICIALIZATION-LEFT ARM;  Surgeon: [name], MD;  Location: [facility];  Service: Vascular;  Laterality: Left;  . INSERTION OF DIALYSIS CATHETER N/A 01/24/2014   Procedure: INSERTION OF DIALYSIS CATHETER;  Surgeon: Rosetta Posner, MD;  Location: [facility];  Service: Vascular;  Laterality: N/A;  . KNEE ARTHROSCOPY    . TUBAL LIGATION      Current  Medications: Outpatient Medications Prior to Visit  Medication Sig Dispense Refill  . acetaminophen (TYLENOL) 500 MG tablet Take 1,000 mg by mouth every 6 (six) hours as needed for moderate pain.     . calcium acetate (PHOSLO) 667 MG capsule Take 667-2,001 capsules by mouth See admin instructions. Takes 1 capsule with snacks and 3 capsules with meals    . Cholecalciferol (VITAMIN D) 2000 UNITS CAPS Take 2,000 Units by mouth daily.     . metoprolol tartrate (LOPRESSOR) 25 MG tablet Take 1-2 tablets (25-50 mg total) by mouth 2 (two) times daily. Take 1-2 tablets (25-50 mg total) by mouth twice daily as directed. 200 tablet 3  . metoprolol tartrate (LOPRESSOR) 50 MG tablet TAKE 1 TABLET BY MOUTH ONCE DAILY IN THE MORNING AND 1 2 (ONE HALF) TABLET IN THE EVENING  2  . Multiple Vitamins-Minerals (PRESERVISION AREDS 2 PO) Take 1 tablet by mouth 2 (two) times daily.    . multivitamin (RENA-VIT) TABS tablet Take by mouth.    . Omega-3 Fatty Acids (OMEGA-3 FISH OIL) 300 MG CAPS Take 300 mg by mouth 2 (two) times daily.    Marland Kitchen omeprazole (PRILOSEC) 20 MG capsule Take 20 mg by mouth daily.    Marland Kitchen Propylene Glycol (SYSTANE BALANCE) 0.6 % SOLN Place 1 drop into both eyes 3 (three) times daily as needed (for burning or dry eyes).     . RENVELA 800 MG tablet Take 800-1,600 mg by mouth See admin instructions. Takes 1 tablet with snacks and 2 tablets with meals    . warfarin (COUMADIN) 5 MG tablet Take 1.5 to 2 tablets by mouth daily as directed by coumadin clinic 150 tablet 1   No facility-administered medications prior to visit.      Allergies:   Patient has no known allergies.   Social History   Socioeconomic History  . Marital status: Widowed    Spouse name: Not on file  . Number of children: Not on file  . Years of education: Not on file  . Highest education level: Not on file  Occupational History  . Not on file  Social Needs  . Financial resource strain: Not on file  . Food insecurity:    Worry:  Not on file    Inability: Not on file  . Transportation needs:    Medical: Not on file    Non-medical: Not on file  Tobacco Use  . Smoking status: Former Smoker    Last attempt to quit: 08/08/1999    Years since quitting: 19.0  . Smokeless tobacco: Never Used  Substance and Sexual Activity  . Alcohol use: No    Alcohol/week: 0.0 standard drinks  . Drug use: No  . Sexual activity: Not Currently  Lifestyle  . Physical activity:    Days per  week: Not on file    Minutes per session: Not on file  . Stress: Not on file  Relationships  . Social connections:    Talks on phone: Not on file    Gets together: Not on file    Attends religious service: Not on file    Active member of club or organization: Not on file    Attends meetings of clubs or organizations: Not on file    Relationship status: Not on file  Other Topics Concern  . Not on file  Social History Narrative   Lives in a one story home.  One of her sons lives with her.  Has 2 sons.  Retired Oncologist.  Eduction: high school.     Family History:  The patient's family history includes Cancer in her brother; Diabetes in her brother, father, mother, and sister; Heart attack in her brother and father; Heart disease in her brother and father; Hyperlipidemia in her sister; Hypertension in her brother, mother, and sister.   ROS:   Please see the history of present illness.    ROS All other systems reviewed and are negative.   PHYSICAL EXAM:   VS:  BP 126/74   Pulse 90   Ht 5\' 4"  (1.626 m)   Wt 222 lb 6.4 oz (100.9 kg)   SpO2 (!) 88%   BMI 38.17 kg/m      General: Alert, oriented x3, no distress, severely obese.  Does not appear cyanotic or uncomfortable. Head: no evidence of trauma, PERRL, EOMI, no exophtalmos or lid lag, no myxedema, no xanthelasma; normal ears, nose and oropharynx Neck: normal jugular venous pulsations and no hepatojugular reflux; brisk carotid pulses without delay and no carotid bruits Chest:  Bilateral rhonchi, reduced breath sounds with dullness to percussion and crackles in the left base Cardiovascular: normal position and quality of the apical impulse, regular rhythm, normal first and second heart sounds, no murmurs, rubs or gallops. Excellent bruit and thrill on left forearm AV fistula. Abdomen: no tenderness or distention, no masses by palpation, no abnormal pulsatility or arterial bruits, normal bowel sounds, no hepatosplenomegaly Extremities: no clubbing, cyanosis or edema; 2+ radial, ulnar and brachial pulses bilaterally; 2+ right femoral, posterior tibial and dorsalis pedis pulses; 2+ left femoral, posterior tibial and dorsalis pedis pulses; no subclavian or femoral bruits Neurological: grossly nonfocal Psych: Normal mood and affect    Wt Readings from Last 3 Encounters:  08/15/18 222 lb 6.4 oz (100.9 kg)  07/19/18 225 lb (102.1 kg)  07/15/18 215 lb (97.5 kg)      Studies/Labs Reviewed:   EKG:  EKG is ordered today.  It was atrial fibrillation with controlled ventricular rate, left axis deviation, borderline voltage criteria for LVH with some secondary repolarization changes, QS pattern in leads V1-V2, QTC 459 ms. Recent Labs: 10/17/2017: TSH 2.550   Lipid Panel    Component Value Date/Time   CHOL 87 (L) 06/08/2015 0735   TRIG 272 (H) 06/08/2015 0735   HDL 25 (L) 06/08/2015 0735   LDLCALC 8 06/08/2015 0735     ASSESSMENT:    1. Pneumonia of left lower lobe due to infectious organism ([place])   2. Permanent atrial fibrillation   3. Chronic diastolic CHF (congestive heart failure) ([place])   4. ESRD (end stage renal disease) ([place])   5. Long term current use of anticoagulant      PLAN:  In order of problems listed above:  1. L pneumonia: despite the fact that she does not  have fever, her symptoms and physical exam are strongly supportive of a diagnosis of pneumonia.  She has evidence of mild hypoxemia but really does not look ill.  Ordered a chest x-ray as well  as antibiotics.  Needs to separate Levaquin and calcium intake by at least 2 hours.  Will need to have INR monitored sooner because of the potential interaction with warfarin.  Advised a visit to the emergency room if she becomes severely short of breath.   2. AFib: After the last adjustment beta-blocker dose, ventricular rate control has improved.  I think the current tailored prescription is providing the right compromise between tachycardia and dialysis induced hypotension..  CHADSVasc 3-4 (age 1, gender, questionable CHF). 3. CHF vs COPD: Obesity limits her exam but as far as I can tell she appears to be euvolemic.  Her current shortness of breath and physical exam suggest a pulmonary rather than cardiac issue.  Previous attempts at challenging her dry weight did not help and were not tolerated.   I offered PFTs but she does not want to have any testing at this time. 4. ESRD: Compliant and well tolerant of dialysis.  She will be seen frequently in the dialysis center so we will have the opportunity to see how she evolves on current antibiotic prescription. 5. Tolerating warfarin anticoagulation without serious bleeding problems.  Casual for an earlier appointment in the [facility].    Medication Adjustments/Labs and Tests Ordered: Current medicines are reviewed at length with the patient today.  Concerns regarding medicines are outlined above.  Medication changes, Labs and Tests ordered today are listed in the Patient Instructions below. Patient Instructions  Medication Instructions:  Dr Sallyanne Kuster has recommended making the following medication changes: 1. START Levaquin 500 mg - take 1 tablet daily for 7 days - do not take within 2 hours of your calcium acetate capsule  If you need a refill on your cardiac medications before your next appointment, please call your pharmacy.   Testing/Procedures: Dr Sallyanne Kuster has ordered a chest x-ray. This has been ordered to be completed at  [facility] in Radiology. A chest x-ray takes a picture of the organs and structures inside the chest, including the heart, lungs, and blood vessels. This test can show several things, including, whether the heart is enlarges; whether fluid is building up in the lungs; and whether pacemaker / defibrillator leads are still in place.  Follow-Up: Your physician recommends that you schedule an appointment in the coumadin clinic early next week in [place].  At [facility], you and your health needs are our priority.  As part of our continuing mission to provide you with exceptional heart care, we have created designated Provider Care Teams.  These Care Teams include your primary Cardiologist (physician) and Advanced Practice Providers (APPs -  Physician Assistants and Nurse Practitioners) who all work together to provide you with the care you need, when you need it. You will need a follow up appointment in 12 months. You may see Sanda Klein, MD or one of the following Advanced Practice Providers on your designated Care Team: [place], [place] . Fabian Sharp, PA-C . You will receive a reminder letter in the mail two months in advance. If you don't receive a letter, please call our office to schedule the follow-up appointment.    Signed, Sanda Klein, MD  08/15/2018 4:38 PM    [facility] [address], [place], [place]  [address] Phone: [phone]; Fax: [phone]   

## 2018-08-16 ENCOUNTER — Telehealth: Payer: Self-pay

## 2018-08-16 DIAGNOSIS — N186 End stage renal disease: Secondary | ICD-10-CM | POA: Diagnosis not present

## 2018-08-16 DIAGNOSIS — J189 Pneumonia, unspecified organism: Secondary | ICD-10-CM

## 2018-08-16 DIAGNOSIS — Z992 Dependence on renal dialysis: Secondary | ICD-10-CM | POA: Diagnosis not present

## 2018-08-16 DIAGNOSIS — J181 Lobar pneumonia, unspecified organism: Principal | ICD-10-CM

## 2018-08-16 NOTE — Telephone Encounter (Signed)
Called patient with results. Patient verbalized understanding and agreed with plan. Repeat CXR ordered to be completed at [facility].

## 2018-08-16 NOTE — Telephone Encounter (Signed)
-----   Message from Sanda Klein, MD sent at 08/16/2018  9:42 AM EST ----- CXR confirms pneumonia. Repeat PA and lateral Xray in 3 weeks please. Do not hesitate to go to ED if dyspnea worsens. MCr

## 2018-08-18 ENCOUNTER — Emergency Department (HOSPITAL_COMMUNITY): Payer: Medicare HMO

## 2018-08-18 ENCOUNTER — Other Ambulatory Visit: Payer: Self-pay

## 2018-08-18 ENCOUNTER — Encounter (HOSPITAL_COMMUNITY): Payer: Self-pay

## 2018-08-18 ENCOUNTER — Inpatient Hospital Stay (HOSPITAL_COMMUNITY)
Admission: EM | Admit: 2018-08-18 | Discharge: 2018-08-24 | DRG: 190 | Disposition: A | Discharge status: Home / Self Care | Payer: Medicare HMO | Attending: Internal Medicine | Admitting: Internal Medicine

## 2018-08-18 DIAGNOSIS — N250 Renal osteodystrophy: Secondary | ICD-10-CM | POA: Diagnosis present

## 2018-08-18 DIAGNOSIS — Z8249 Family history of ischemic heart disease and other diseases of the circulatory system: Secondary | ICD-10-CM

## 2018-08-18 DIAGNOSIS — I132 Hypertensive heart and chronic kidney disease with heart failure and with stage 5 chronic kidney disease, or end stage renal disease: Secondary | ICD-10-CM | POA: Diagnosis present

## 2018-08-18 DIAGNOSIS — I5043 Acute on chronic combined systolic (congestive) and diastolic (congestive) heart failure: Secondary | ICD-10-CM | POA: Diagnosis present

## 2018-08-18 DIAGNOSIS — G629 Polyneuropathy, unspecified: Secondary | ICD-10-CM | POA: Diagnosis present

## 2018-08-18 DIAGNOSIS — J189 Pneumonia, unspecified organism: Secondary | ICD-10-CM

## 2018-08-18 DIAGNOSIS — Z992 Dependence on renal dialysis: Secondary | ICD-10-CM | POA: Diagnosis not present

## 2018-08-18 DIAGNOSIS — Z90710 Acquired absence of both cervix and uterus: Secondary | ICD-10-CM | POA: Diagnosis not present

## 2018-08-18 DIAGNOSIS — Z9842 Cataract extraction status, left eye: Secondary | ICD-10-CM | POA: Diagnosis not present

## 2018-08-18 DIAGNOSIS — I4821 Permanent atrial fibrillation: Secondary | ICD-10-CM | POA: Diagnosis not present

## 2018-08-18 DIAGNOSIS — J811 Chronic pulmonary edema: Secondary | ICD-10-CM

## 2018-08-18 DIAGNOSIS — J9601 Acute respiratory failure with hypoxia: Secondary | ICD-10-CM | POA: Diagnosis not present

## 2018-08-18 DIAGNOSIS — I4891 Unspecified atrial fibrillation: Secondary | ICD-10-CM

## 2018-08-18 DIAGNOSIS — I5021 Acute systolic (congestive) heart failure: Secondary | ICD-10-CM | POA: Diagnosis not present

## 2018-08-18 DIAGNOSIS — N186 End stage renal disease: Secondary | ICD-10-CM | POA: Diagnosis not present

## 2018-08-18 DIAGNOSIS — Z6836 Body mass index (BMI) 36.0-36.9, adult: Secondary | ICD-10-CM | POA: Diagnosis not present

## 2018-08-18 DIAGNOSIS — J440 Chronic obstructive pulmonary disease with acute lower respiratory infection: Secondary | ICD-10-CM | POA: Diagnosis not present

## 2018-08-18 DIAGNOSIS — Y95 Nosocomial condition: Secondary | ICD-10-CM | POA: Diagnosis present

## 2018-08-18 DIAGNOSIS — I5030 Unspecified diastolic (congestive) heart failure: Secondary | ICD-10-CM | POA: Diagnosis not present

## 2018-08-18 DIAGNOSIS — Z79899 Other long term (current) drug therapy: Secondary | ICD-10-CM

## 2018-08-18 DIAGNOSIS — K219 Gastro-esophageal reflux disease without esophagitis: Secondary | ICD-10-CM | POA: Diagnosis present

## 2018-08-18 DIAGNOSIS — I429 Cardiomyopathy, unspecified: Secondary | ICD-10-CM | POA: Diagnosis present

## 2018-08-18 DIAGNOSIS — D631 Anemia in chronic kidney disease: Secondary | ICD-10-CM | POA: Diagnosis not present

## 2018-08-18 DIAGNOSIS — E669 Obesity, unspecified: Secondary | ICD-10-CM | POA: Diagnosis present

## 2018-08-18 DIAGNOSIS — R000 Tachycardia, unspecified: Secondary | ICD-10-CM | POA: Diagnosis not present

## 2018-08-18 DIAGNOSIS — I10 Essential (primary) hypertension: Secondary | ICD-10-CM | POA: Diagnosis not present

## 2018-08-18 DIAGNOSIS — Z961 Presence of intraocular lens: Secondary | ICD-10-CM | POA: Diagnosis present

## 2018-08-18 DIAGNOSIS — I361 Nonrheumatic tricuspid (valve) insufficiency: Secondary | ICD-10-CM | POA: Diagnosis not present

## 2018-08-18 DIAGNOSIS — J441 Chronic obstructive pulmonary disease with (acute) exacerbation: Secondary | ICD-10-CM | POA: Diagnosis present

## 2018-08-18 DIAGNOSIS — J810 Acute pulmonary edema: Secondary | ICD-10-CM

## 2018-08-18 DIAGNOSIS — J181 Lobar pneumonia, unspecified organism: Secondary | ICD-10-CM

## 2018-08-18 DIAGNOSIS — E782 Mixed hyperlipidemia: Secondary | ICD-10-CM | POA: Diagnosis present

## 2018-08-18 DIAGNOSIS — Z9841 Cataract extraction status, right eye: Secondary | ICD-10-CM

## 2018-08-18 DIAGNOSIS — Z87891 Personal history of nicotine dependence: Secondary | ICD-10-CM

## 2018-08-18 DIAGNOSIS — Z8349 Family history of other endocrine, nutritional and metabolic diseases: Secondary | ICD-10-CM

## 2018-08-18 DIAGNOSIS — Z8542 Personal history of malignant neoplasm of other parts of uterus: Secondary | ICD-10-CM

## 2018-08-18 DIAGNOSIS — R011 Cardiac murmur, unspecified: Secondary | ICD-10-CM | POA: Diagnosis present

## 2018-08-18 DIAGNOSIS — R0602 Shortness of breath: Secondary | ICD-10-CM | POA: Diagnosis not present

## 2018-08-18 DIAGNOSIS — I5032 Chronic diastolic (congestive) heart failure: Secondary | ICD-10-CM | POA: Diagnosis not present

## 2018-08-18 DIAGNOSIS — D649 Anemia, unspecified: Secondary | ICD-10-CM | POA: Diagnosis present

## 2018-08-18 DIAGNOSIS — J9602 Acute respiratory failure with hypercapnia: Secondary | ICD-10-CM

## 2018-08-18 DIAGNOSIS — Z7901 Long term (current) use of anticoagulants: Secondary | ICD-10-CM

## 2018-08-18 HISTORY — DX: End stage renal disease: N18.6

## 2018-08-18 HISTORY — DX: Unspecified diastolic (congestive) heart failure: I50.30

## 2018-08-18 HISTORY — DX: End stage renal disease: Z99.2

## 2018-08-18 HISTORY — DX: Malignant neoplasm of uterus, part unspecified: C55

## 2018-08-18 LAB — I-STAT CG4 LACTIC ACID, ED
Lactic Acid, Venous: 6.17 mmol/L (ref 0.5–1.9)
Lactic Acid, Venous: 1.64 mmol/L (ref 0.5–1.9)

## 2018-08-18 LAB — I-STAT CHEM 8, ED
BUN: 41 mg/dL — ABNORMAL HIGH (ref 8–23)
CALCIUM ION: 1.29 mmol/L (ref 1.15–1.40)
Chloride: 102 mmol/L (ref 98–111)
Creatinine, Ser: 9.2 mg/dL — ABNORMAL HIGH (ref 0.44–1.00)
Glucose, Bld: 239 mg/dL — ABNORMAL HIGH (ref 70–99)
HCT: 28 % — ABNORMAL LOW (ref 36.0–46.0)
Hemoglobin: 9.5 g/dL — ABNORMAL LOW (ref 12.0–15.0)
Potassium: 4.5 mmol/L (ref 3.5–5.1)
SODIUM: 136 mmol/L (ref 135–145)
TCO2: 22 mmol/L (ref 22–32)

## 2018-08-18 LAB — CBC WITH DIFFERENTIAL/PLATELET
Abs Immature Granulocytes: 0.08 10*3/uL — ABNORMAL HIGH (ref 0.00–0.07)
BASOS ABS: 0 10*3/uL (ref 0.0–0.1)
Basophils Relative: 0 %
Eosinophils Absolute: 0.1 10*3/uL (ref 0.0–0.5)
Eosinophils Relative: 1 %
HCT: 29.7 % — ABNORMAL LOW (ref 36.0–46.0)
Hemoglobin: 9.3 g/dL — ABNORMAL LOW (ref 12.0–15.0)
Immature Granulocytes: 1 %
Lymphocytes Relative: 15 %
Lymphs Abs: 1.6 10*3/uL (ref 0.7–4.0)
MCH: 33.6 pg (ref 26.0–34.0)
MCHC: 31.3 g/dL (ref 30.0–36.0)
MCV: 107.2 fL — ABNORMAL HIGH (ref 80.0–100.0)
Monocytes Absolute: 0.8 10*3/uL (ref 0.1–1.0)
Monocytes Relative: 7 %
NEUTROS PCT: 76 %
Neutro Abs: 8.1 10*3/uL — ABNORMAL HIGH (ref 1.7–7.7)
Platelets: 301 10*3/uL (ref 150–400)
RBC: 2.77 MIL/uL — ABNORMAL LOW (ref 3.87–5.11)
RDW: 13.1 % (ref 11.5–15.5)
WBC: 10.6 10*3/uL — ABNORMAL HIGH (ref 4.0–10.5)
nRBC: 0 % (ref 0.0–0.2)

## 2018-08-18 LAB — COMPREHENSIVE METABOLIC PANEL
ALT: 36 U/L (ref 0–44)
AST: 33 U/L (ref 15–41)
Albumin: 3.2 g/dL — ABNORMAL LOW (ref 3.5–5.0)
Alkaline Phosphatase: 65 U/L (ref 38–126)
Anion gap: 17 — ABNORMAL HIGH (ref 5–15)
BUN: 50 mg/dL — ABNORMAL HIGH (ref 8–23)
CO2: 18 mmol/L — ABNORMAL LOW (ref 22–32)
CREATININE: 9.57 mg/dL — AB (ref 0.44–1.00)
Calcium: 10.1 mg/dL (ref 8.9–10.3)
Chloride: 102 mmol/L (ref 98–111)
GFR calc Af Amer: 4 mL/min — ABNORMAL LOW (ref >60)
GFR calc non Af Amer: 3 mL/min — ABNORMAL LOW (ref >60)
Glucose, Bld: 242 mg/dL — ABNORMAL HIGH (ref 70–99)
Potassium: 4.4 mmol/L (ref 3.5–5.1)
Sodium: 137 mmol/L (ref 135–145)
Total Bilirubin: 1.2 mg/dL (ref 0.3–1.2)
Total Protein: 6.8 g/dL (ref 6.5–8.1)

## 2018-08-18 LAB — PROTIME-INR
INR: 2.32
Prothrombin Time: 25.1 seconds — ABNORMAL HIGH (ref 11.4–15.2)

## 2018-08-18 LAB — MRSA PCR SCREENING: MRSA by PCR: NEGATIVE

## 2018-08-18 LAB — I-STAT TROPONIN, ED: Troponin i, poc: 0.02 ng/mL (ref 0.00–0.08)

## 2018-08-18 MED ORDER — LEVALBUTEROL HCL 0.63 MG/3ML IN NEBU
0.6300 mg | INHALATION_SOLUTION | Freq: Four times a day (QID) | RESPIRATORY_TRACT | Status: DC
  Administered 2018-08-18 – 2018-08-19 (×6): 0.63 mg via RESPIRATORY_TRACT
  Filled 2018-08-18 (×6): qty 3

## 2018-08-18 MED ORDER — ALTEPLASE 2 MG IJ SOLR: 2.0000 mg | Freq: Once | INTRAMUSCULAR | Status: DC | PRN

## 2018-08-18 MED ORDER — SODIUM CHLORIDE 0.9 % IV SOLN: 100.0000 mL | INTRAVENOUS | Status: DC | PRN

## 2018-08-18 MED ORDER — CHLORHEXIDINE GLUCONATE CLOTH 2 % EX PADS
6.0000 | MEDICATED_PAD | Freq: Every day | CUTANEOUS | Status: DC
  Administered 2018-08-19 – 2018-08-20 (×2): 6 via TOPICAL

## 2018-08-18 MED ORDER — DILTIAZEM HCL 100 MG IV SOLR
INTRAVENOUS | Status: AC
  Administered 2018-08-18: 11:00:00
  Filled 2018-08-18: qty 100

## 2018-08-18 MED ORDER — SEVELAMER CARBONATE 800 MG PO TABS
1600.0000 mg | ORAL_TABLET | Freq: Three times a day (TID) | ORAL | Status: DC
  Administered 2018-08-19 – 2018-08-24 (×17): 1600 mg via ORAL
  Filled 2018-08-18 (×17): qty 2

## 2018-08-18 MED ORDER — WARFARIN SODIUM 7.5 MG PO TABS
7.5000 mg | ORAL_TABLET | Freq: Once | ORAL | Status: AC
  Administered 2018-08-18: 7.5 mg via ORAL
  Filled 2018-08-18 (×2): qty 1

## 2018-08-18 MED ORDER — LIDOCAINE-PRILOCAINE 2.5-2.5 % EX CREA: 1.0000 "application " | TOPICAL_CREAM | CUTANEOUS | Status: DC | PRN

## 2018-08-18 MED ORDER — SODIUM CHLORIDE 0.9 % IV SOLN
500.0000 mg | Freq: Once | INTRAVENOUS | Status: AC
  Administered 2018-08-18: 500 mg via INTRAVENOUS
  Filled 2018-08-18: qty 500

## 2018-08-18 MED ORDER — LIDOCAINE HCL (PF) 1 % IJ SOLN: 5.0000 mL | INTRAMUSCULAR | Status: DC | PRN

## 2018-08-18 MED ORDER — DILTIAZEM HCL 25 MG/5ML IV SOLN
10.0000 mg | Freq: Once | INTRAVENOUS | Status: AC
  Administered 2018-08-18: 10 mg via INTRAVENOUS

## 2018-08-18 MED ORDER — PENTAFLUOROPROP-TETRAFLUOROETH EX AERO: 1.0000 "application " | INHALATION_SPRAY | CUTANEOUS | Status: DC | PRN

## 2018-08-18 MED ORDER — LEVALBUTEROL HCL 0.63 MG/3ML IN NEBU
0.6300 mg | INHALATION_SOLUTION | Freq: Once | RESPIRATORY_TRACT | Status: AC
  Administered 2018-08-18: 0.63 mg via RESPIRATORY_TRACT
  Filled 2018-08-18: qty 3

## 2018-08-18 MED ORDER — DILTIAZEM HCL 100 MG IV SOLR
5.0000 mg/h | INTRAVENOUS | Status: DC
  Administered 2018-08-18: 10 mg/h via INTRAVENOUS
  Administered 2018-08-18 – 2018-08-19 (×2): 5 mg/h via INTRAVENOUS
  Administered 2018-08-19 – 2018-08-20 (×2): 10 mg/h via INTRAVENOUS
  Administered 2018-08-20: 5 mg/h via INTRAVENOUS
  Filled 2018-08-18 (×5): qty 100

## 2018-08-18 MED ORDER — PANTOPRAZOLE SODIUM 40 MG PO TBEC
40.0000 mg | DELAYED_RELEASE_TABLET | Freq: Every day | ORAL | Status: DC
  Administered 2018-08-18 – 2018-08-24 (×7): 40 mg via ORAL
  Filled 2018-08-18 (×7): qty 1

## 2018-08-18 MED ORDER — SODIUM CHLORIDE 0.9 % IV SOLN
1.0000 g | Freq: Once | INTRAVENOUS | Status: AC
  Administered 2018-08-18: 1 g via INTRAVENOUS
  Filled 2018-08-18: qty 10

## 2018-08-18 MED ORDER — SODIUM CHLORIDE 0.9 % IV SOLN
500.0000 mg | INTRAVENOUS | Status: DC
  Administered 2018-08-19 – 2018-08-20 (×2): 500 mg via INTRAVENOUS
  Filled 2018-08-18 (×4): qty 500

## 2018-08-18 MED ORDER — METOPROLOL TARTRATE 25 MG PO TABS
50.0000 mg | ORAL_TABLET | Freq: Every day | ORAL | Status: DC
  Administered 2018-08-18 – 2018-08-19 (×2): 50 mg via ORAL
  Filled 2018-08-18 (×2): qty 2

## 2018-08-18 MED ORDER — METOPROLOL TARTRATE 25 MG PO TABS
25.0000 mg | ORAL_TABLET | Freq: Every day | ORAL | Status: DC
  Administered 2018-08-18: 25 mg via ORAL
  Filled 2018-08-18: qty 1

## 2018-08-18 MED ORDER — SEVELAMER CARBONATE 800 MG PO TABS
800.0000 mg | ORAL_TABLET | ORAL | Status: DC
  Administered 2018-08-18 – 2018-08-24 (×8): 800 mg via ORAL
  Filled 2018-08-18 (×6): qty 1

## 2018-08-18 MED ORDER — HEPARIN SODIUM (PORCINE) 1000 UNIT/ML DIALYSIS: 1000.0000 [IU] | INTRAMUSCULAR | Status: DC | PRN

## 2018-08-18 MED ORDER — RENA-VITE PO TABS
1.0000 | ORAL_TABLET | Freq: Every day | ORAL | Status: DC
  Administered 2018-08-18 – 2018-08-23 (×6): 1 via ORAL
  Filled 2018-08-18 (×6): qty 1

## 2018-08-18 MED ORDER — ACETAMINOPHEN 500 MG PO TABS: 500.0000 mg | ORAL_TABLET | Freq: Four times a day (QID) | ORAL | Status: DC | PRN

## 2018-08-18 MED ORDER — SODIUM CHLORIDE 0.9 % IV SOLN
1.0000 g | INTRAVENOUS | Status: DC
  Administered 2018-08-19 – 2018-08-24 (×6): 1 g via INTRAVENOUS
  Filled 2018-08-18: qty 1
  Filled 2018-08-18: qty 10
  Filled 2018-08-18 (×2): qty 1
  Filled 2018-08-18: qty 10
  Filled 2018-08-18 (×2): qty 1

## 2018-08-18 MED ORDER — WARFARIN - PHARMACIST DOSING INPATIENT
Freq: Every day | Status: DC
  Administered 2018-08-20: 16:00:00

## 2018-08-18 NOTE — ED Notes (Signed)
Have paged respiratory  

## 2018-08-18 NOTE — ED Provider Notes (Signed)
[facility] EMERGENCY DEPARTMENT Provider Note   CSN: [id number] Arrival date & time: 08/18/18  1026     History   Chief Complaint Chief Complaint  Patient presents with  . Shortness of Breath    HPI Sabrina Beck is a 78 y.o. female.  Patient complains of shortness of breath.  Patient states she was diagnosed with pneumonia number days ago and was put on Levaquin.  She has dialysis 3 days a week  The history is provided by the patient. No language interpreter was used.  Shortness of Breath  Severity:  Moderate Onset quality:  Sudden Duration:  3 days Timing:  Constant Progression:  Worsening Chronicity:  Recurrent Context: activity   Worsened by:  Nothing Ineffective treatments:  None tried Associated symptoms: no abdominal pain, no chest pain, no cough, no headaches and no rash   Risk factors: no recent alcohol use     Past Medical History:  Diagnosis Date  . Anemia 03/27/2011  . Atrial fibrillation ([place])   . Cancer ([place])    uterine  . Chest pain 05/15/2006   stress test perform negative for ischemia  . CHF (congestive heart failure) ([place])   . Complication of anesthesia   . Diverticulosis   . Dysrhythmia    afib  . Family history of anesthesia complication    "brother has nausea when put to sleep."  . GERD (gastroesophageal reflux disease)   . Headache   . Kidney disease    stage 4 chronic  . Mixed hyperlipidemia   . Murmur 06/04/2006,09/29/10   2D Echo perform both test shows EF 55%  . Peripheral neuropathy   . PONV (postoperative nausea and vomiting)   . Renal disorder   . Shortness of breath    with exertion  . Systemic hypertension     Patient Active Problem List   Diagnosis Date Noted  . ESRD (end stage renal disease) ([place]) 05/19/2014  . 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 Abscess re-check 01/19/2014  . Breast abscess of female 01/19/2014  . Chronic diastolic CHF (congestive heart failure) ([place])  12/25/2013  . Dyspnea 12/21/2013  . CHF (congestive heart failure) ([place]) 12/21/2013  . End stage renal disease ([place]) 11/06/2013  . CKD (chronic kidney disease) stage 5, GFR less than 15 ml/min (HCC) 10/21/2013  . HTN (hypertension) 10/21/2013  . Mixed hyperlipidemia 10/21/2013  . Encounter for therapeutic drug monitoring 09/03/2013  . Atrial fibrillation ([place]) 10/11/2012  . Long term current use of anticoagulant therapy 10/11/2012  . Anemia 03/27/2011    Past Surgical History:  Procedure Laterality Date  . ABDOMINAL HYSTERECTOMY    . APPENDECTOMY    . AV FISTULA PLACEMENT Left 11/17/2013   Procedure: CREATION OF LEFT RADIOCEPHALIC ARTERIOVENOUS (AV) FISTULA ;  Surgeon: [name], MD;  Location: [place];  Service: Vascular;  Laterality: Left;  . BREAST SURGERY Left    tumors non canerous 2- 3 different surgeries  . CATARACT EXTRACTION W/PHACO Right 10/26/2016   Procedure: CATARACT EXTRACTION PHACO AND INTRAOCULAR LENS PLACEMENT (IOC) CDE= 11.91;  Surgeon: Tonny Branch, MD;  Location: [facility];  Service: Ophthalmology;  Laterality: Right;  right - pt knows to arrive at 6:30  . CATARACT EXTRACTION W/PHACO Left 11/30/2016   Procedure: CATARACT EXTRACTION PHACO AND INTRAOCULAR LENS PLACEMENT (IOC) CDE - 7.92 ;  Surgeon: Tonny Branch, MD;  Location: [facility];  Service: Ophthalmology;  Laterality: Left;  left  . CHOLECYSTECTOMY    . COLONOSCOPY  W/ BIOPSIES AND POLYPECTOMY    . FISTULA SUPERFICIALIZATION Left 05/19/2014   Procedure: FISTULA SUPERFICIALIZATION-LEFT ARM;  Surgeon: [name], MD;  Location: [place];  Service: Vascular;  Laterality: Left;  . INSERTION OF DIALYSIS CATHETER N/A 01/24/2014   Procedure: INSERTION OF DIALYSIS CATHETER;  Surgeon: Rosetta Posner, MD;  Location: [facility];  Service: Vascular;  Laterality: N/A;  . KNEE ARTHROSCOPY    . TUBAL LIGATION       OB History    Gravida  3   Para  2   Term  2   Preterm      AB  1   Living        SAB  1   TAB       Ectopic      Multiple      Live Births               Home Medications    Prior to Admission medications   Medication Sig Start Date End Date Taking? Authorizing Provider  acetaminophen (TYLENOL) 500 MG tablet Take 1,000 mg by mouth every 6 (six) hours as needed for moderate pain.    Yes [provider]  calcium acetate (PHOSLO) 667 MG capsule Take 667-2,001 capsules by mouth See admin instructions. Takes 1 capsule with snacks and 3 capsules with meals 05/15/15  Yes [provider]  Cholecalciferol (VITAMIN D) 2000 UNITS CAPS Take 2,000 Units by mouth daily.    Yes [provider]  levofloxacin (LEVAQUIN) 500 MG tablet Take 1 tablet (500 mg total) by mouth daily for 7 days. 08/15/18 08/22/18 Yes Croitoru, Mihai, MD  metoprolol tartrate (LOPRESSOR) 25 MG tablet Take 1-2 tablets (25-50 mg total) by mouth 2 (two) times daily. Take 1-2 tablets (25-50 mg total) by mouth twice daily as directed. 11/23/17  Yes Croitoru, Mihai, MD  metoprolol tartrate (LOPRESSOR) 50 MG tablet TAKE 1 TABLET BY MOUTH ONCE DAILY IN THE MORNING AND 1 2 (ONE HALF) TABLET IN THE EVENING 07/06/18  Yes [provider]  Multiple Vitamins-Minerals (PRESERVISION AREDS 2 PO) Take 1 tablet by mouth 2 (two) times daily.   Yes [provider]  multivitamin (RENA-VIT) TABS tablet Take by mouth. 07/06/18  Yes [provider]  Omega-3 Fatty Acids (OMEGA-3 FISH OIL) 300 MG CAPS Take 300 mg by mouth 2 (two) times daily.   Yes [provider]  omeprazole (PRILOSEC) 20 MG capsule Take 20 mg by mouth daily.   Yes [provider]  Propylene Glycol (SYSTANE BALANCE) 0.6 % SOLN Place 1 drop into both eyes 3 (three) times daily as needed (for burning or dry eyes).    Yes [provider]  RENVELA 800 MG tablet Take 800-1,600 mg by mouth See admin instructions. Takes 1 tablet with snacks and 2 tablets with meals 04/17/15  Yes [provider]  warfarin  (COUMADIN) 5 MG tablet Take 1.5 to 2 tablets by mouth daily as directed by coumadin clinic 07/08/18  Yes Croitoru, Mihai, MD    Family History Family History  Problem Relation Age of Onset  . Diabetes Mother   . Hypertension Mother   . Diabetes Father   . Heart disease Father        before age 45  . Heart attack Father   . Diabetes Sister   . Hyperlipidemia Sister   . Hypertension Sister   . Cancer Brother   . Diabetes Brother   . Heart disease Brother   . Hypertension  Brother   . Heart attack Brother     Social History Social History   Tobacco Use  . Smoking status: Former Smoker    Last attempt to quit: 08/08/1999    Years since quitting: 19.0  . Smokeless tobacco: Never Used  Substance Use Topics  . Alcohol use: No    Alcohol/week: 0.0 standard drinks  . Drug use: No     Allergies   Patient has no known allergies.   Review of Systems Review of Systems  Constitutional: Negative for appetite change and fatigue.  HENT: Negative for congestion, ear discharge and sinus pressure.   Eyes: Negative for discharge.  Respiratory: Positive for shortness of breath. Negative for cough.   Cardiovascular: Negative for chest pain.  Gastrointestinal: Negative for abdominal pain and diarrhea.  Genitourinary: Negative for frequency and hematuria.  Musculoskeletal: Negative for back pain.  Skin: Negative for rash.  Neurological: Negative for seizures and headaches.  Psychiatric/Behavioral: Negative for hallucinations.     Physical Exam Updated Vital Signs BP (!) 160/116   Pulse (!) 112   Temp 98.1 F (36.7 C)   Resp (!) 30   Ht 5\' 4"  (1.626 m)   Wt 100.9 kg   SpO2 93%   BMI 38.17 kg/m   Physical Exam Vitals signs reviewed.  Constitutional:      Appearance: She is well-developed.  HENT:     Head: Normocephalic.     Nose: Nose normal.  Eyes:     General: No scleral icterus.    Conjunctiva/sclera: Conjunctivae normal.  Neck:     Musculoskeletal: Neck supple.       Thyroid: No thyromegaly.  Cardiovascular:     Heart sounds: No murmur. No friction rub. No gallop.      Comments: Rapid irregular rate Pulmonary:     Breath sounds: No stridor. Wheezing present. No rales.     Comments: Tachypnea Chest:     Chest wall: No tenderness.  Abdominal:     General: There is no distension.     Tenderness: There is no abdominal tenderness. There is no rebound.  Musculoskeletal: Normal range of motion.  Lymphadenopathy:     Cervical: No cervical adenopathy.  Skin:    Findings: No erythema or rash.  Neurological:     Mental Status: She is alert and oriented to person, place, and time.     Motor: No abnormal muscle tone.     Coordination: Coordination normal.  Psychiatric:        Behavior: Behavior normal.      ED Treatments / Results  Labs (all labs ordered are listed, but only abnormal results are displayed) Labs Reviewed  CBC WITH DIFFERENTIAL/PLATELET - Abnormal; Notable for the following components:      Result Value   WBC 10.6 (*)    RBC 2.77 (*)    Hemoglobin 9.3 (*)    HCT 29.7 (*)    MCV 107.2 (*)    Neutro Abs 8.1 (*)    Abs Immature Granulocytes 0.08 (*)    All other components within normal limits  COMPREHENSIVE METABOLIC PANEL - Abnormal; Notable for the following components:   CO2 18 (*)    Glucose, Bld 242 (*)    BUN 50 (*)    Creatinine, Ser 9.57 (*)    Albumin 3.2 (*)    GFR calc non Af Amer 3 (*)    GFR calc Af Amer 4 (*)    Anion gap 17 (*)    All other  components within normal limits  PROTIME-INR - Abnormal; Notable for the following components:   Prothrombin Time 25.1 (*)    All other components within normal limits  I-STAT CHEM 8, ED - Abnormal; Notable for the following components:   BUN 41 (*)    Creatinine, Ser 9.20 (*)    Glucose, Bld 239 (*)    Hemoglobin 9.5 (*)    HCT 28.0 (*)    All other components within normal limits  I-STAT CG4 LACTIC ACID, ED - Abnormal; Notable for the following components:    Lactic Acid, Venous 6.17 (*)    All other components within normal limits  CULTURE, BLOOD (ROUTINE X 2)  CULTURE, BLOOD (ROUTINE X 2)  I-STAT TROPONIN, ED    EKG EKG Interpretation  Date/Time:  Sunday August 18 2018 10:49:13 EST Ventricular Rate:  158 PR Interval:    QRS Duration: 138 QT Interval:  328 QTC Calculation: 532 R Axis:   -95 Text Interpretation:  Wide-QRS tachycardia Nonspecific IVCD with LAD Baseline wander in lead(s) II aVR aVF V1 V2 V3 V4 Confirmed by Milton Ferguson [phone]) on 08/18/2018 10:54:19 AM   Radiology Dg Chest Portable 1 View  Result Date: 08/18/2018 CLINICAL DATA:  Shortness of breath.  History of pneumonia. EXAM: PORTABLE CHEST 1 VIEW COMPARISON:  Chest radiograph 08/15/2018 FINDINGS: Motion artifact limits exam. Monitoring leads overlie the patient. Stable cardiomegaly. Aortic atherosclerosis. Interval increase in consolidative opacities left upper lung. Interval development of diffuse bilateral interstitial pulmonary opacities. IMPRESSION: Increasing consolidation within the left upper lung which may represent pneumonia. Followup PA and lateral chest X-ray is recommended in 3-4 weeks following trial of antibiotic therapy to ensure resolution and exclude underlying malignancy. Interval development of diffuse bilateral interstitial opacities which may represent edema. Electronically Signed   By: Lovey Newcomer M.D.   On: 08/18/2018 11:47    Procedures Procedures (including critical care time)  Medications Ordered in ED Medications  diltiazem (CARDIZEM) 100 mg in dextrose 5 % 100 mL (1 mg/mL) infusion (15 mg/hr Intravenous Rate/Dose Change 08/18/18 1159)  azithromycin (ZITHROMAX) 500 mg in sodium chloride 0.9 % 250 mL IVPB (500 mg Intravenous New Bag/Given 08/18/18 1159)  diltiazem (CARDIZEM) injection 10 mg (10 mg Intravenous Given 08/18/18 1100)  dextrose 5 % with diltiazem (CARDIZEM) ADS Med (  New Bag/Given 08/18/18 1059)  cefTRIAXone (ROCEPHIN) 1 g in sodium  chloride 0.9 % 100 mL IVPB (0 g Intravenous Stopped 08/18/18 1200)  levalbuterol (XOPENEX) nebulizer solution 0.63 mg (0.63 mg Nebulization Given 08/18/18 1149)     Initial Impression / Assessment and Plan / ED Course  I have reviewed the triage vital signs and the nursing notes.  Pertinent labs & imaging results that were available during my care of the patient were reviewed by me and considered in my medical decision making (see chart for details).     CRITICAL CARE Performed by: Milton Ferguson Total critical care time: 40 minutes Critical care time was exclusive of separately billable procedures and treating other patients. Critical care was necessary to treat or prevent imminent or life-threatening deterioration. Critical care was time spent personally by me on the following activities: development of treatment plan with patient and/or surrogate as well as nursing, discussions with consultants, evaluation of patient's response to treatment, examination of patient, obtaining history from patient or surrogate, ordering and performing treatments and interventions, ordering and review of laboratory studies, ordering and review of radiographic studies, pulse oximetry and re-evaluation of patient's condition.  Patient with pneumonia and  possible heart failure.  I spoke with critical care and since the patient was only needing 4 L nasal oxygen it was felt like the patient could be admitted here.  The hospitalist is to see the patient and speak with nephrology and arrange treatment and admission Final Clinical Impressions(s) / ED Diagnoses   Final diagnoses:  Community acquired pneumonia, unspecified laterality    ED Discharge Orders    None       Milton Ferguson, MD 08/18/18 1213

## 2018-08-18 NOTE — Progress Notes (Signed)
ANTICOAGULATION CONSULT NOTE - Initial Consult  Pharmacy Consult for Coumadin Indication: atrial fibrillation  No Known Allergies  Patient Measurements: Height: 5\' 4"  (162.6 cm) Weight: 222 lb 6.4 oz (100.9 kg) IBW/kg (Calculated) : 54.7  Vital Signs: Temp: 98.1 F (36.7 C) (01/12 1051) BP: 117/82 (01/12 1330) Pulse Rate: 105 (01/12 1330)  Labs: Recent Labs    08/18/18 1056 08/18/18 1106 08/18/18 1125  HGB 9.3* 9.5*  --   HCT 29.7* 28.0*  --   PLT 301  --   --   LABPROT  --   --  25.1*  INR  --   --  2.32  CREATININE 9.57* 9.20*  --     Estimated Creatinine Clearance: 5.7 mL/min (A) (by C-G formula based on SCr of 9.2 mg/dL (H)).   Medical History: Past Medical History:  Diagnosis Date  . Anemia 03/27/2011  . Atrial fibrillation ([place])   . Cancer ([place])    uterine  . Chest pain 05/15/2006   stress test perform negative for ischemia  . CHF (congestive heart failure) ([place])   . Complication of anesthesia   . Diverticulosis   . Dysrhythmia    afib  . Family history of anesthesia complication    "brother has nausea when put to sleep."  . GERD (gastroesophageal reflux disease)   . Headache   . Kidney disease    stage 4 chronic  . Mixed hyperlipidemia   . Murmur 06/04/2006,09/29/10   2D Echo perform both test shows EF 55%  . Peripheral neuropathy   . PONV (postoperative nausea and vomiting)   . Renal disorder   . Shortness of breath    with exertion  . Systemic hypertension     Medications:  See med rec  Assessment: 78 yo female presented to ED with shortness of breath. She is on chronic coumadin for afib and followed in anticoag clinic. Her INR today is therapeutic. She is on antibiotics so INR may drift upwards. Will monitor.  Home dose is 10mg  every Monday and Thursday;  7.5mg  all other days.  Goal of Therapy:  INR 2-3 Monitor platelets by anticoagulation protocol: Yes   Plan:  Coumadin 7.5mg  po x 1 today PT-INR daily  Monitor for S/S of  bleeding  Isac Sarna, BS Vena [place], BCPS Clinical Pharmacist Pager [phone] 08/18/2018,1:34 PM

## 2018-08-18 NOTE — ED Notes (Addendum)
CRITICAL VALUE ALERT  Critical Value:  Lactic 6.17  Date & Time Notied:  08/18/2018 1118  Provider Notified: Dr. Roderic Palau   Orders Received/Actions taken: Antibiotics

## 2018-08-18 NOTE — ED Triage Notes (Addendum)
Pt reports increase SOB for the past week and worsening today,. Pt seen last Thursday and was dx with pneumonia and has been taking antibiotics. Productive yellow mucous. Sats 85 % on room air upon being roomed

## 2018-08-18 NOTE — ED Notes (Signed)
Have given pt a bolus dose of 10 per verbal order from Dr. Roderic Palau

## 2018-08-18 NOTE — Consult Note (Signed)
Reason for Consult: ESRD Referring Physician:  Dr. Cruzita Lederer  Chief Complaint:  Shortness of breath  Assessment/Plan: 1. ESRD - We will need to get records from [facility] tomorrow. She does not know her EDW  But what's concerning is ? Interstitial edema on CXR, wheezing which may be cardiac asthma, h/o by pt of not having  Much fluid removed this past week, low sat's on RA and mild edema in the lower ext. - Planning on HD today and will write for orders for tomorrow; we will challenge her EDW as tolerated. - Planning on HD tomorrow as well. 2. Anemia - @ goal. Can check w/ her center tomorrow to determine when last ESA was given.  3. Renal osteodystrophy - will check a phos tomorrow to help with binder management.  4. PNA - being tx for CAP.  5. Afib RVR - rate control. On Coumadin with a therpeutic INR 2.32. 6. HTN elevated - home meds resumed. 7. dCHF    HPI: Sabrina Beck is an 78 y.o. female HTN, cCHF, afib on anticoagulation ESRD MWF [facility] with last dialysis on Friday. She has not missed any treatments or signed off early but does state she occasionally cramps. She believes very little fluid was removed each treatment this week but is not able to specify how much fluid was removed or what her EDW or even post HD weight was this past week. She was recently treated w/ Levaquin for possible left side PNA but has not responded with worsening cough productive of yellowish sputum, dyspnea. She denies f/c/rigors and came to the ED secondary to worsening dyspnea and was found to be hypoxic with low O2 sats, afib RVR. She denies any NSAID use, n/v/d/ abd pain or CP.   ROS Pertinent items are noted in HPI.  Chemistry and CBC: Creatinine, Ser  Date/Time Value Ref Range Status  08/18/2018 11:06 AM 9.20 (H) 0.44 - 1.00 mg/dL Final  08/18/2018 10:56 AM 9.57 (H) 0.44 - 1.00 mg/dL Final  11/30/2016 10:13 AM 8.40 (H) 0.44 - 1.00 mg/dL Final  10/26/2016 07:19 AM 7.40 (H) 0.44 - 1.00  mg/dL Final  06/08/2015 07:35 AM 7.71 (H) 0.57 - 1.00 mg/dL Final  05/19/2014 12:42 PM 7.90 (H) 0.50 - 1.10 mg/dL Final  01/05/2014 01:10 PM 7.37 (H) 0.50 - 1.10 mg/dL Final  12/25/2013 06:00 AM 6.99 (H) 0.50 - 1.10 mg/dL Final  12/24/2013 09:25 AM 6.11 (H) 0.50 - 1.10 mg/dL Final  12/23/2013 05:52 AM 5.42 (H) 0.50 - 1.10 mg/dL Final  12/22/2013 02:35 AM 4.74 (H) 0.50 - 1.10 mg/dL Final  12/21/2013 02:36 PM 4.73 (H) 0.50 - 1.10 mg/dL Final  12/12/2013 03:30 PM 5.13 (H) 0.50 - 1.10 mg/dL Final  11/13/2013 02:30 PM 4.69 (H) 0.50 - 1.10 mg/dL Final  10/24/2013 09:10 AM 4.60 (H) 0.50 - 1.10 mg/dL Final  09/26/2013 09:00 AM 4.02 (H) 0.50 - 1.10 mg/dL Final  08/29/2013 11:18 AM 4.05 (H) 0.50 - 1.10 mg/dL Final  07/28/2013 09:08 AM 3.95 (H) 0.50 - 1.10 mg/dL Final  06/30/2013 09:05 AM 4.25 (H) 0.50 - 1.10 mg/dL Final  06/02/2013 09:20 AM 4.11 (H) 0.50 - 1.10 mg/dL Final  05/05/2013 08:30 AM 3.96 (H) 0.50 - 1.10 mg/dL Final  04/03/2013 10:50 AM 4.12 (H) 0.50 - 1.10 mg/dL Final  03/03/2013 08:04 AM 3.73 (H) 0.50 - 1.10 mg/dL Final  01/31/2013 08:30 AM 3.68 (H) 0.50 - 1.10 mg/dL Final  01/02/2013 10:57 AM 3.58 (H) 0.50 - 1.10 mg/dL Final  11/19/2012  10:16 AM 3.71 (H) 0.50 - 1.10 mg/dL Final  10/22/2012 10:57 AM 3.84 (H) 0.50 - 1.10 mg/dL Final  09/24/2012 10:47 AM 3.71 (H) 0.50 - 1.10 mg/dL Final  08/27/2012 11:49 AM 3.26 (H) 0.50 - 1.10 mg/dL Final  07/29/2012 11:26 AM 4.02 (H) 0.50 - 1.10 mg/dL Final  07/02/2012 10:45 AM 3.13 (H) 0.50 - 1.10 mg/dL Final  05/21/2012 10:25 AM 3.22 (H) 0.50 - 1.10 mg/dL Final  04/23/2012 09:53 AM 3.02 (H) 0.50 - 1.10 mg/dL Final  03/26/2012 10:00 AM 3.17 (H) 0.50 - 1.10 mg/dL Final  03/03/2012 03:17 PM 3.01 (H) 0.50 - 1.10 mg/dL Final  02/13/2012 11:31 AM 2.94 (H) 0.50 - 1.10 mg/dL Final  01/11/2012 10:39 AM 3.25 (H) 0.50 - 1.10 mg/dL Final  12/14/2011 10:30 AM 3.68 (H) 0.50 - 1.10 mg/dL Final  11/16/2011 10:00 AM 3.61 (H) 0.50 - 1.10 mg/dL Final  10/19/2011  10:45 AM 3.00 (H) 0.50 - 1.10 mg/dL Final  07/04/2011 11:43 AM 2.68 (H) 0.50 - 1.10 mg/dL Final  06/06/2011 09:56 AM 2.56 (H) 0.50 - 1.10 mg/dL Final  05/09/2011 11:31 AM 2.41 (H) 0.50 - 1.10 mg/dL Final  04/11/2011 10:40 AM 2.59 (H) 0.50 - 1.10 mg/dL Final   Recent Labs  Lab 08/18/18 1056 08/18/18 1106  NA 137 136  K 4.4 4.5  CL 102 102  CO2 18*  --   GLUCOSE 242* 239*  BUN 50* 41*  CREATININE 9.57* 9.20*  CALCIUM 10.1  --    Recent Labs  Lab 08/18/18 1056 08/18/18 1106  WBC 10.6*  --   NEUTROABS 8.1*  --   HGB 9.3* 9.5*  HCT 29.7* 28.0*  MCV 107.2*  --   PLT 301  --    Liver Function Tests: Recent Labs  Lab 08/18/18 1056  AST 33  ALT 36  ALKPHOS 65  BILITOT 1.2  PROT 6.8  ALBUMIN 3.2*   No results for input(s): LIPASE, AMYLASE in the last 168 hours. No results for input(s): AMMONIA in the last 168 hours. Cardiac Enzymes: No results for input(s): CKTOTAL, CKMB, CKMBINDEX, TROPONINI in the last 168 hours. Iron Studies: No results for input(s): IRON, TIBC, TRANSFERRIN, FERRITIN in the last 72 hours. PT/INR: @LABRCNTIP (inr:5)  Xrays/Other Studies: ) Results for orders placed or performed during the hospital encounter of 08/18/18 (from the past 48 hour(s))  CBC with Differential/Platelet     Status: Abnormal   Collection Time: 08/18/18 10:56 AM  Result Value Ref Range   WBC 10.6 (H) 4.0 - 10.5 K/uL   RBC 2.77 (L) 3.87 - 5.11 MIL/uL   Hemoglobin 9.3 (L) 12.0 - 15.0 g/dL   HCT 29.7 (L) 36.0 - 46.0 %   MCV 107.2 (H) 80.0 - 100.0 fL   MCH 33.6 26.0 - 34.0 pg   MCHC 31.3 30.0 - 36.0 g/dL   RDW 13.1 11.5 - 15.5 %   Platelets 301 150 - 400 K/uL   nRBC 0.0 0.0 - 0.2 %   Neutrophils Relative % 76 %   Neutro Abs 8.1 (H) 1.7 - 7.7 K/uL   Lymphocytes Relative 15 %   Lymphs Abs 1.6 0.7 - 4.0 K/uL   Monocytes Relative 7 %   Monocytes Absolute 0.8 0.1 - 1.0 K/uL   Eosinophils Relative 1 %   Eosinophils Absolute 0.1 0.0 - 0.5 K/uL   Basophils Relative 0 %    Basophils Absolute 0.0 0.0 - 0.1 K/uL   WBC Morphology MILD LEFT SHIFT (1-5% METAS, OCC MYELO, OCC BANDS)  Immature Granulocytes 1 %   Abs Immature Granulocytes 0.08 (H) 0.00 - 0.07 K/uL   Reactive, Benign Lymphocytes PRESENT     Comment: Performed at [facility], [address], [place], [place] [address]  Comprehensive metabolic panel     Status: Abnormal   Collection Time: 08/18/18 10:56 AM  Result Value Ref Range   Sodium 137 135 - 145 mmol/L   Potassium 4.4 3.5 - 5.1 mmol/L   Chloride 102 98 - 111 mmol/L   CO2 18 (L) 22 - 32 mmol/L   Glucose, Bld 242 (H) 70 - 99 mg/dL   BUN 50 (H) 8 - 23 mg/dL   Creatinine, Ser 9.57 (H) 0.44 - 1.00 mg/dL   Calcium 10.1 8.9 - 10.3 mg/dL   Total Protein 6.8 6.5 - 8.1 g/dL   Albumin 3.2 (L) 3.5 - 5.0 g/dL   AST 33 15 - 41 U/L   ALT 36 0 - 44 U/L   Alkaline Phosphatase 65 38 - 126 U/L   Total Bilirubin 1.2 0.3 - 1.2 mg/dL   GFR calc non Af Amer 3 (L) >60 mL/min   GFR calc Af Amer 4 (L) >60 mL/min   Anion gap 17 (H) 5 - 15    Comment: Performed at [facility], [address] [place], [place] [address]  I-Stat CG4 Lactic Acid, ED     Status: Abnormal   Collection Time: 08/18/18 11:05 AM  Result Value Ref Range   Lactic Acid, Venous 6.17 (HH) 0.5 - 1.9 mmol/L   Comment NOTIFIED PHYSICIAN   I-stat chem 8, ed     Status: Abnormal   Collection Time: 08/18/18 11:06 AM  Result Value Ref Range   Sodium 136 135 - 145 mmol/L   Potassium 4.5 3.5 - 5.1 mmol/L   Chloride 102 98 - 111 mmol/L   BUN 41 (H) 8 - 23 mg/dL   Creatinine, Ser 9.20 (H) 0.44 - 1.00 mg/dL   Glucose, Bld 239 (H) 70 - 99 mg/dL   Calcium, Ion 1.29 1.15 - 1.40 mmol/L   TCO2 22 22 - 32 mmol/L   Hemoglobin 9.5 (L) 12.0 - 15.0 g/dL   HCT 28.0 (L) 36.0 - 46.0 %  Blood culture (routine x 2)     Status: None (Preliminary result)   Collection Time: 08/18/18 11:12 AM  Result Value Ref Range   Specimen Description BLOOD RIGHT HAND    Special Requests      BOTTLES DRAWN AEROBIC AND  ANAEROBIC Blood Culture adequate volume Performed at [facility], [address] [place], [place] [address]    Culture PENDING    Report Status PENDING   I-stat troponin, ED     Status: None   Collection Time: 08/18/18 11:17 AM  Result Value Ref Range   Troponin i, poc 0.02 0.00 - 0.08 ng/mL   Comment 3            Comment: Due to the release kinetics of cTnI, a negative result within the first hours of the onset of symptoms does not rule out myocardial infarction with certainty. If myocardial infarction is still suspected, repeat the test at appropriate intervals.   Protime-INR     Status: Abnormal   Collection Time: 08/18/18 11:25 AM  Result Value Ref Range   Prothrombin Time 25.1 (H) 11.4 - 15.2 seconds   INR 2.32     Comment: Performed at [facility], [address] [place], [place] [address]   Dg Chest Portable 1 View  Result Date: 08/18/2018 CLINICAL DATA:  Shortness of breath.  History of pneumonia. EXAM: PORTABLE CHEST 1 VIEW COMPARISON:  Chest radiograph 08/15/2018 FINDINGS: Motion artifact limits exam. Monitoring leads overlie the patient. Stable cardiomegaly. Aortic atherosclerosis. Interval increase in consolidative opacities left upper lung. Interval development of diffuse bilateral interstitial pulmonary opacities. IMPRESSION: Increasing consolidation within the left upper lung which may represent pneumonia. Followup PA and lateral chest X-ray is recommended in 3-4 weeks following trial of antibiotic therapy to ensure resolution and exclude underlying malignancy. Interval development of diffuse bilateral interstitial opacities which may represent edema. Electronically Signed   By: Lovey Newcomer M.D.   On: 08/18/2018 11:47    PMH:   Past Medical History:  Diagnosis Date  . Anemia 03/27/2011  . Atrial fibrillation ([place])   . Cancer ([place])    uterine  . Chest pain 05/15/2006   stress test perform negative for ischemia  . CHF (congestive heart failure) ([place])   .  Complication of anesthesia   . Diverticulosis   . Dysrhythmia    afib  . Family history of anesthesia complication    "brother has nausea when put to sleep."  . GERD (gastroesophageal reflux disease)   . Headache   . Kidney disease    stage 4 chronic  . Mixed hyperlipidemia   . Murmur 06/04/2006,09/29/10   2D Echo perform both test shows EF 55%  . Peripheral neuropathy   . PONV (postoperative nausea and vomiting)   . Renal disorder   . Shortness of breath    with exertion  . Systemic hypertension     PSH:   Past Surgical History:  Procedure Laterality Date  . ABDOMINAL HYSTERECTOMY    . APPENDECTOMY    . AV FISTULA PLACEMENT Left 11/17/2013   Procedure: CREATION OF LEFT RADIOCEPHALIC ARTERIOVENOUS (AV) FISTULA ;  Surgeon: [name], MD;  Location: [facility];  Service: Vascular;  Laterality: Left;  . BREAST SURGERY Left    tumors non canerous 2- 3 different surgeries  . CATARACT EXTRACTION W/PHACO Right 10/26/2016   Procedure: CATARACT EXTRACTION PHACO AND INTRAOCULAR LENS PLACEMENT (IOC) CDE= 11.91;  Surgeon: Tonny Branch, MD;  Location: [facility];  Service: Ophthalmology;  Laterality: Right;  right - pt knows to arrive at 6:30  . CATARACT EXTRACTION W/PHACO Left 11/30/2016   Procedure: CATARACT EXTRACTION PHACO AND INTRAOCULAR LENS PLACEMENT (IOC) CDE - 7.92 ;  Surgeon: Tonny Branch, MD;  Location: [facility];  Service: Ophthalmology;  Laterality: Left;  left  . CHOLECYSTECTOMY    . COLONOSCOPY W/ BIOPSIES AND POLYPECTOMY    . FISTULA SUPERFICIALIZATION Left 05/19/2014   Procedure: FISTULA SUPERFICIALIZATION-LEFT ARM;  Surgeon: [name], MD;  Location: [facility];  Service: Vascular;  Laterality: Left;  . INSERTION OF DIALYSIS CATHETER N/A 01/24/2014   Procedure: INSERTION OF DIALYSIS CATHETER;  Surgeon: Rosetta Posner, MD;  Location: [facility] OR;  Service: Vascular;  Laterality: N/A;  . KNEE ARTHROSCOPY    . TUBAL LIGATION      Allergies: No Known Allergies  Medications:   Prior to  Admission medications   Medication Sig Start Date End Date Taking? Authorizing Provider  acetaminophen (TYLENOL) 500 MG tablet Take 1,000 mg by mouth every 6 (six) hours as needed for moderate pain.    Yes [provider]  calcium acetate (PHOSLO) 667 MG capsule Take 667-2,001 capsules by mouth See admin instructions. Takes 1 capsule with snacks and 3 capsules with meals 05/15/15  Yes [provider]  Cholecalciferol (  VITAMIN D) 2000 UNITS CAPS Take 2,000 Units by mouth daily.    Yes [provider]  levofloxacin (LEVAQUIN) 500 MG tablet Take 1 tablet (500 mg total) by mouth daily for 7 days. 08/15/18 08/22/18 Yes Croitoru, Mihai, MD  metoprolol tartrate (LOPRESSOR) 50 MG tablet TAKE 1 TABLET BY MOUTH ONCE DAILY IN THE MORNING AND 1 2 (ONE HALF) TABLET IN THE EVENING 07/06/18  Yes [provider]  Multiple Vitamins-Minerals (PRESERVISION AREDS 2 PO) Take 1 tablet by mouth 2 (two) times daily.   Yes [provider]  multivitamin (RENA-VIT) TABS tablet Take by mouth. 07/06/18  Yes [provider]  Omega-3 Fatty Acids (OMEGA-3 FISH OIL) 300 MG CAPS Take 300 mg by mouth 2 (two) times daily.   Yes [provider]  omeprazole (PRILOSEC) 20 MG capsule Take 20 mg by mouth daily.   Yes [provider]  Propylene Glycol (SYSTANE BALANCE) 0.6 % SOLN Place 1 drop into both eyes 3 (three) times daily as needed (for burning or dry eyes).    Yes [provider]  RENVELA 800 MG tablet Take 800-1,600 mg by mouth See admin instructions. Takes 1 tablet with snacks and 2 tablets with meals 04/17/15  Yes [provider]  warfarin (COUMADIN) 5 MG tablet Take 1.5 to 2 tablets by mouth daily as directed by coumadin clinic Patient taking differently: Take 5 mg by mouth daily at 6 PM. On Mon and Fri take 2 tabs on Tues, Wed, Thurs, Sat, Sun take 1.5 tabs 07/08/18  Yes Croitoru, Mihai, MD  metoprolol tartrate (LOPRESSOR) 25 MG tablet Take 1-2  tablets (25-50 mg total) by mouth 2 (two) times daily. Take 1-2 tablets (25-50 mg total) by mouth twice daily as directed. Patient not taking: Reported on 08/18/2018 11/23/17   Croitoru, Dani Gobble, MD    Discontinued Meds:  There are no discontinued medications.  Social History:  reports that she quit smoking about 19 years ago. She has never used smokeless tobacco. She reports that she does not drink alcohol or use drugs.  Family History:   Family History  Problem Relation Age of Onset  . Diabetes Mother   . Hypertension Mother   . Diabetes Father   . Heart disease Father        before age 30  . Heart attack Father   . Diabetes Sister   . Hyperlipidemia Sister   . Hypertension Sister   . Cancer Brother   . Diabetes Brother   . Heart disease Brother   . Hypertension Brother   . Heart attack Brother     Blood pressure 121/76, pulse (!) 133, temperature 98.1 F (36.7 C), resp. rate 20, height 5\' 4"  (1.626 m), weight 100.9 kg, SpO2 95 %. General appearance: alert, cooperative and appears stated age Head: Normocephalic, without obvious abnormality, atraumatic Eyes: negative Neck: no adenopathy, no carotid bruit, supple, symmetrical, trachea midline and thyroid not enlarged, symmetric, no tenderness/mass/nodules Back: symmetric, no curvature. ROM normal. No CVA tenderness. Resp: wheezes bilaterally Chest wall: no tenderness Cardio: irregularly irregular rhythm GI: soft, non-tender; bowel sounds normal; no masses,  no organomegaly Extremities: edema tr- 1+ Pulses: 2+ and symmetric Skin: Skin color, texture, turgor normal. No rashes or lesions Lymph nodes: Cervical adenopathy: none Neurologic: Grossly normal       , Hunt Oris, MD 08/18/2018, 1:30 PM

## 2018-08-18 NOTE — H&P (Signed)
History and Physical    Sabrina Beck WUX:[id number] DOB: Oct 10, 1940 DOA: 08/18/2018  I have briefly reviewed the patient's prior medical records in [place]  PCP: Redmond School, MD  Patient coming from: Home  Chief Complaint: Shortness of breath  HPI: Sabrina Beck is a 78 y.o. female with medical history significant of end-stage renal disease, permanent atrial fibrillation on chronic anticoagulation with Coumadin, chronic diastolic CHF, hypertension, GERD, presents to the hospital with chief complaint of significant shortness of breath.  Patient tells me that she has been bothered by URI type symptoms with sinus congestion, cough, for the past 2 to 3 weeks.  She continued to feel bad, and was evaluated by her cardiologist as an outpatient about 3 days ago, and chest x-ray at that time showed left perihilar airspace disease concerning for pneumonia.  She was placed on Levaquin.  Her symptoms have progressed in the last couple of days, had significant difficulties with breathing, and decided to come to the emergency room.  On arrival she was found to be hypoxic with oxygen sats in the low 80s on room air is requiring nasal cannula, as well as A. fib with RVR.  On my evaluation, patient appears tachypneic but relatively comfortable on 4 L nasal cannula.  She is not short of breath when she came in.  She denies any chest pain, denies any palpitations.  She denies any fevers but did say that she has had some subjective chills.  She complains of a cough with yellow sputum production.  She denies any abdominal pain, no nausea vomiting or diarrhea.  She states that she got dialyzed last yesterday, has not missed any sessions, however tells me that she does not think she had any fluid pulled on dialysis this whole week, "they just cleaned my blood".  ED Course: In the ED patient is hypoxic, tachypneic, tachycardic into the 140s, chest x-ray shows increased consolidation in the left upper lung but  also diffuse bilateral opacities suggesting edema.  Blood work pertinent for lactic acid of 6.1, creatinine is elevated to 9, she has a leukocytosis with a white count of 10.6.  She is afebrile  Review of Systems: As per HPI otherwise 10 point review of systems negative.   Past Medical History:  Diagnosis Date  . Anemia 03/27/2011  . Atrial fibrillation ([place])   . Cancer ([place])    uterine  . Chest pain 05/15/2006   stress test perform negative for ischemia  . CHF (congestive heart failure) ([place])   . Complication of anesthesia   . Diverticulosis   . Dysrhythmia    afib  . Family history of anesthesia complication    "brother has nausea when put to sleep."  . GERD (gastroesophageal reflux disease)   . Headache   . Kidney disease    stage 4 chronic  . Mixed hyperlipidemia   . Murmur 06/04/2006,09/29/10   2D Echo perform both test shows EF 55%  . Peripheral neuropathy   . PONV (postoperative nausea and vomiting)   . Renal disorder   . Shortness of breath    with exertion  . Systemic hypertension     Past Surgical History:  Procedure Laterality Date  . ABDOMINAL HYSTERECTOMY    . APPENDECTOMY    . AV FISTULA PLACEMENT Left 11/17/2013   Procedure: CREATION OF LEFT RADIOCEPHALIC ARTERIOVENOUS (AV) FISTULA ;  Surgeon: [name], MD;  Location: [facility];  Service: Vascular;  Laterality: Left;  . BREAST SURGERY Left  tumors non canerous 2- 3 different surgeries  . CATARACT EXTRACTION W/PHACO Right 10/26/2016   Procedure: CATARACT EXTRACTION PHACO AND INTRAOCULAR LENS PLACEMENT (IOC) CDE= 11.91;  Surgeon: Tonny Branch, MD;  Location: [facility];  Service: Ophthalmology;  Laterality: Right;  right - pt knows to arrive at 6:30  . CATARACT EXTRACTION W/PHACO Left 11/30/2016   Procedure: CATARACT EXTRACTION PHACO AND INTRAOCULAR LENS PLACEMENT (IOC) CDE - 7.92 ;  Surgeon: Tonny Branch, MD;  Location: [facility];  Service: Ophthalmology;  Laterality: Left;  left  . CHOLECYSTECTOMY    . COLONOSCOPY  W/ BIOPSIES AND POLYPECTOMY    . FISTULA SUPERFICIALIZATION Left 05/19/2014   Procedure: FISTULA SUPERFICIALIZATION-LEFT ARM;  Surgeon: [name], MD;  Location: [facility];  Service: Vascular;  Laterality: Left;  . INSERTION OF DIALYSIS CATHETER N/A 01/24/2014   Procedure: INSERTION OF DIALYSIS CATHETER;  Surgeon: Rosetta Posner, MD;  Location: [facility];  Service: Vascular;  Laterality: N/A;  . KNEE ARTHROSCOPY    . TUBAL LIGATION       reports that she quit smoking about 19 years ago. She has never used smokeless tobacco. She reports that she does not drink alcohol or use drugs.  No Known Allergies  Family History  Problem Relation Age of Onset  . Diabetes Mother   . Hypertension Mother   . Diabetes Father   . Heart disease Father        before age 43  . Heart attack Father   . Diabetes Sister   . Hyperlipidemia Sister   . Hypertension Sister   . Cancer Brother   . Diabetes Brother   . Heart disease Brother   . Hypertension Brother   . Heart attack Brother     Prior to Admission medications   Medication Sig Start Date End Date Taking? Authorizing Provider  acetaminophen (TYLENOL) 500 MG tablet Take 1,000 mg by mouth every 6 (six) hours as needed for moderate pain.    Yes [provider]  calcium acetate (PHOSLO) 667 MG capsule Take 667-2,001 capsules by mouth See admin instructions. Takes 1 capsule with snacks and 3 capsules with meals 05/15/15  Yes [provider]  Cholecalciferol (VITAMIN D) 2000 UNITS CAPS Take 2,000 Units by mouth daily.    Yes [provider]  levofloxacin (LEVAQUIN) 500 MG tablet Take 1 tablet (500 mg total) by mouth daily for 7 days. 08/15/18 08/22/18 Yes Croitoru, Mihai, MD  metoprolol tartrate (LOPRESSOR) 25 MG tablet Take 1-2 tablets (25-50 mg total) by mouth 2 (two) times daily. Take 1-2 tablets (25-50 mg total) by mouth twice daily as directed. 11/23/17  Yes Croitoru, Mihai, MD  metoprolol tartrate (LOPRESSOR) 50 MG tablet TAKE 1  TABLET BY MOUTH ONCE DAILY IN THE MORNING AND 1 2 (ONE HALF) TABLET IN THE EVENING 07/06/18  Yes [provider]  Multiple Vitamins-Minerals (PRESERVISION AREDS 2 PO) Take 1 tablet by mouth 2 (two) times daily.   Yes [provider]  multivitamin (RENA-VIT) TABS tablet Take by mouth. 07/06/18  Yes [provider]  Omega-3 Fatty Acids (OMEGA-3 FISH OIL) 300 MG CAPS Take 300 mg by mouth 2 (two) times daily.   Yes [provider]  omeprazole (PRILOSEC) 20 MG capsule Take 20 mg by mouth daily.   Yes [provider]  Propylene Glycol (SYSTANE BALANCE) 0.6 % SOLN Place 1 drop into both eyes 3 (three) times daily as needed (for burning or dry eyes).    Yes [provider]  RENVELA  800 MG tablet Take 800-1,600 mg by mouth See admin instructions. Takes 1 tablet with snacks and 2 tablets with meals 04/17/15  Yes [provider]  warfarin (COUMADIN) 5 MG tablet Take 1.5 to 2 tablets by mouth daily as directed by coumadin clinic 07/08/18  Yes Croitoru, Mihai, MD    Physical Exam: Vitals:   08/18/18 1100 08/18/18 1130 08/18/18 1149 08/18/18 1230  BP: (!) 156/103 (!) 160/116  139/83  Pulse: (!) 130 (!) 112  (!) 117  Resp: (!) 28 (!) 30  (!) 28  Temp:      SpO2: 92% 93% 93% 93%  Weight:      Height:        Constitutional: Tachypneic, overall appears uncomfortable but speaks in full sentences Eyes: PERRL, lids and conjunctivae normal, no scleral icterus ENMT: Mucous membranes are moist. Neck: normal, supple, no masses, no thyromegaly Respiratory: Diffuse rhonchorous bilateral breath sounds, bilateral crackles heard, diffuse wheezing, increased work of breathing Cardiovascular: Irregularly irregular, no murmurs appreciated, trace lower extremity edema.  JVD present  Abdomen: no tenderness, no masses palpated. Bowel sounds positive.  Musculoskeletal: no clubbing / cyanosis. Normal muscle tone.  Skin: no rashes, lesions, ulcers. No  induration Neurologic: CN 2-12 grossly intact. Strength 5/5 in all 4.  Psychiatric: Normal judgment and insight. Alert and oriented x 3. Normal mood.   Labs on Admission: I have personally reviewed following labs and imaging studies  CBC: Recent Labs  Lab 08/18/18 1056 08/18/18 1106  WBC 10.6*  --   NEUTROABS 8.1*  --   HGB 9.3* 9.5*  HCT 29.7* 28.0*  MCV 107.2*  --   PLT 301  --    Basic Metabolic Panel: Recent Labs  Lab 08/18/18 1056 08/18/18 1106  NA 137 136  K 4.4 4.5  CL 102 102  CO2 18*  --   GLUCOSE 242* 239*  BUN 50* 41*  CREATININE 9.57* 9.20*  CALCIUM 10.1  --    GFR: Estimated Creatinine Clearance: 5.7 mL/min (A) (by C-G formula based on SCr of 9.2 mg/dL (H)). Liver Function Tests: Recent Labs  Lab 08/18/18 1056  AST 33  ALT 36  ALKPHOS 65  BILITOT 1.2  PROT 6.8  ALBUMIN 3.2*   No results for input(s): LIPASE, AMYLASE in the last 168 hours. No results for input(s): AMMONIA in the last 168 hours. Coagulation Profile: Recent Labs  Lab 08/18/18 1125  INR 2.32   Cardiac Enzymes: No results for input(s): CKTOTAL, CKMB, CKMBINDEX, TROPONINI in the last 168 hours. BNP (last 3 results) No results for input(s): PROBNP in the last 8760 hours. HbA1C: No results for input(s): HGBA1C in the last 72 hours. CBG: No results for input(s): GLUCAP in the last 168 hours. Lipid Profile: No results for input(s): CHOL, HDL, LDLCALC, TRIG, CHOLHDL, LDLDIRECT in the last 72 hours. Thyroid Function Tests: No results for input(s): TSH, T4TOTAL, FREET4, T3FREE, THYROIDAB in the last 72 hours. Anemia Panel: No results for input(s): VITAMINB12, FOLATE, FERRITIN, TIBC, IRON, RETICCTPCT in the last 72 hours. Urine analysis:    Component Value Date/Time   COLORURINE YELLOW 03/03/2012 1500   APPEARANCEUR CLEAR 03/03/2012 1500   LABSPEC 1.020 03/03/2012 1500   PHURINE 6.0 03/03/2012 1500   GLUCOSEU 100 (A) 03/03/2012 1500   HGBUR TRACE (A) 03/03/2012 1500    BILIRUBINUR NEGATIVE 03/03/2012 1500   KETONESUR NEGATIVE 03/03/2012 1500   PROTEINUR 100 (A) 03/03/2012 1500   UROBILINOGEN 0.2 03/03/2012 1500   NITRITE NEGATIVE 03/03/2012 1500  LEUKOCYTESUR NEGATIVE 03/03/2012 1500     Radiological Exams on Admission: Dg Chest Portable 1 View  Result Date: 08/18/2018 CLINICAL DATA:  Shortness of breath.  History of pneumonia. EXAM: PORTABLE CHEST 1 VIEW COMPARISON:  Chest radiograph 08/15/2018 FINDINGS: Motion artifact limits exam. Monitoring leads overlie the patient. Stable cardiomegaly. Aortic atherosclerosis. Interval increase in consolidative opacities left upper lung. Interval development of diffuse bilateral interstitial pulmonary opacities. IMPRESSION: Increasing consolidation within the left upper lung which may represent pneumonia. Followup PA and lateral chest X-ray is recommended in 3-4 weeks following trial of antibiotic therapy to ensure resolution and exclude underlying malignancy. Interval development of diffuse bilateral interstitial opacities which may represent edema. Electronically Signed   By: Lovey Newcomer M.D.   On: 08/18/2018 11:47    EKG: Independently reviewed.  Laboratory reviewed showed A. fib with RVR  Assessment/Plan Principal Problem:   Acute respiratory failure with hypoxia (HCC) Active Problems:   Anemia   Atrial fibrillation (HCC)   HTN (hypertension)   Chronic diastolic CHF (congestive heart failure) (HCC)   ESRD (end stage renal disease) ([place])   HCAP (healthcare-associated pneumonia)   Pulmonary edema   Lobar pneumonia (HCC)   Principal Problem Acute hypoxic respiratory failure with hypoxia, likely multifactorial due to lobar pneumonia as well as a degree of pulmonary edema -Admit patient to stepdown, stable on nasal cannula but low threshold to place on BiPAP -Placed on antibiotics with ceftriaxone and azithromycin, continue, cultures pending -There is a degree of fluid overload and pulmonary edema based on  chest x-ray review, nephrology consulted, may need to be dialyzed, defer to them -Has wheezing on exam, no history of COPD or asthma, likely wheezing due to peribronchial edema, provide Xopenex nebulizers  Active Problems Atrial fibrillation, permanent, with RVR -Patient placed on Cardizem infusion, continue, resume home metoprolol, suspect rate is worse in the setting of acute hypoxia and fluid overload.  Anticipate heart rate improved with dialysis -Continue Coumadin per pharmacy  Hypertension -Blood pressure in the 150-160s on arrival, resume metoprolol  Anemia in the setting of end-stage renal disease -Hemoglobin stable, no bleeding  GERD -Continue home PPI  Acute on chronic diastolic CHF -Fluid overloaded, likely to resolve with dialysis   DVT prophylaxis: On Coumadin Code Status: Full code Family Communication: Son present at bedside Disposition Plan: Admit to stepdown Consults called: Nephrology, discussed with Dr. Jonnie Finner who in turn discussed with Dr. Augustin Coupe who will evaluate patient here   Marzetta Board, MD, PhD [facility]  Contact via [email or web address]  [facility] Office Info P: [phone]  F: [phone]   08/18/2018, 12:57 PM

## 2018-08-19 DIAGNOSIS — I5032 Chronic diastolic (congestive) heart failure: Secondary | ICD-10-CM

## 2018-08-19 LAB — COMPREHENSIVE METABOLIC PANEL
ALT: 31 U/L (ref 0–44)
AST: 24 U/L (ref 15–41)
Albumin: 2.9 g/dL — ABNORMAL LOW (ref 3.5–5.0)
Alkaline Phosphatase: 57 U/L (ref 38–126)
Anion gap: 11 (ref 5–15)
BUN: 28 mg/dL — ABNORMAL HIGH (ref 8–23)
CHLORIDE: 100 mmol/L (ref 98–111)
CO2: 28 mmol/L (ref 22–32)
Calcium: 9.6 mg/dL (ref 8.9–10.3)
Creatinine, Ser: 6.34 mg/dL — ABNORMAL HIGH (ref 0.44–1.00)
GFR calc Af Amer: 7 mL/min — ABNORMAL LOW (ref >60)
GFR calc non Af Amer: 6 mL/min — ABNORMAL LOW (ref >60)
Glucose, Bld: 121 mg/dL — ABNORMAL HIGH (ref 70–99)
POTASSIUM: 4 mmol/L (ref 3.5–5.1)
Sodium: 139 mmol/L (ref 135–145)
Total Bilirubin: 1 mg/dL (ref 0.3–1.2)
Total Protein: 6.4 g/dL — ABNORMAL LOW (ref 6.5–8.1)

## 2018-08-19 LAB — EXPECTORATED SPUTUM ASSESSMENT W GRAM STAIN, RFLX TO RESP C

## 2018-08-19 LAB — CBC
HCT: 27.5 % — ABNORMAL LOW (ref 36.0–46.0)
Hemoglobin: 8.9 g/dL — ABNORMAL LOW (ref 12.0–15.0)
MCH: 34.8 pg — ABNORMAL HIGH (ref 26.0–34.0)
MCHC: 32.4 g/dL (ref 30.0–36.0)
MCV: 107.4 fL — ABNORMAL HIGH (ref 80.0–100.0)
Platelets: 248 10*3/uL (ref 150–400)
RBC: 2.56 MIL/uL — ABNORMAL LOW (ref 3.87–5.11)
RDW: 13.1 % (ref 11.5–15.5)
WBC: 9.6 10*3/uL (ref 4.0–10.5)
nRBC: 0 % (ref 0.0–0.2)

## 2018-08-19 LAB — PROTIME-INR
INR: 2.42
Prothrombin Time: 26 seconds — ABNORMAL HIGH (ref 11.4–15.2)

## 2018-08-19 LAB — EXPECTORATED SPUTUM ASSESSMENT W REFEX TO RESP CULTURE

## 2018-08-19 LAB — HIV ANTIBODY (ROUTINE TESTING W REFLEX): HIV Screen 4th Generation wRfx: NONREACTIVE

## 2018-08-19 MED ORDER — METOPROLOL TARTRATE 50 MG PO TABS
75.0000 mg | ORAL_TABLET | Freq: Two times a day (BID) | ORAL | Status: DC
  Administered 2018-08-19: 75 mg via ORAL
  Filled 2018-08-19: qty 1

## 2018-08-19 MED ORDER — WARFARIN SODIUM 5 MG PO TABS
10.0000 mg | ORAL_TABLET | Freq: Once | ORAL | Status: AC
  Administered 2018-08-19: 10 mg via ORAL
  Filled 2018-08-19: qty 2

## 2018-08-19 MED ORDER — LEVALBUTEROL HCL 0.63 MG/3ML IN NEBU
0.6300 mg | INHALATION_SOLUTION | Freq: Four times a day (QID) | RESPIRATORY_TRACT | Status: DC
  Administered 2018-08-20 – 2018-08-24 (×16): 0.63 mg via RESPIRATORY_TRACT
  Filled 2018-08-19 (×17): qty 3

## 2018-08-19 MED ORDER — GUAIFENESIN-DM 100-10 MG/5ML PO SYRP
5.0000 mL | ORAL_SOLUTION | ORAL | Status: DC | PRN
  Administered 2018-08-19: 5 mL via ORAL
  Filled 2018-08-19: qty 5

## 2018-08-19 NOTE — Progress Notes (Signed)
ANTICOAGULATION CONSULT NOTE - follow up [facility] for Coumadin Indication: atrial fibrillation  No Known Allergies  Patient Measurements: Height: 5\' 4"  (162.6 cm) Weight: 218 lb 4.1 oz (99 kg) IBW/kg (Calculated) : 54.7  Vital Signs: Temp: 98.7 F (37.1 C) (01/13 0801) Temp Source: Oral (01/13 0801) BP: 140/82 (01/13 0801) Pulse Rate: 106 (01/13 0801)  Labs: Recent Labs    08/18/18 1056 08/18/18 1106 08/18/18 1125 08/19/18 0522  HGB 9.3* 9.5*  --  8.9*  HCT 29.7* 28.0*  --  27.5*  PLT 301  --   --  248  LABPROT  --   --  25.1* 26.0*  INR  --   --  2.32 2.42  CREATININE 9.57* 9.20*  --  6.34*    Estimated Creatinine Clearance: 8.2 mL/min (A) (by C-G formula based on SCr of 6.34 mg/dL (H)).   Medical History: Past Medical History:  Diagnosis Date  . Anemia 03/27/2011  . Atrial fibrillation ([place])   . Cancer ([place])    uterine  . Chest pain 05/15/2006   stress test perform negative for ischemia  . CHF (congestive heart failure) ([place])   . Complication of anesthesia   . Diverticulosis   . Dysrhythmia    afib  . Family history of anesthesia complication    "brother has nausea when put to sleep."  . GERD (gastroesophageal reflux disease)   . Headache   . Kidney disease    stage 4 chronic  . Mixed hyperlipidemia   . Murmur 06/04/2006,09/29/10   2D Echo perform both test shows EF 55%  . Peripheral neuropathy   . PONV (postoperative nausea and vomiting)   . Renal disorder   . Shortness of breath    with exertion  . Systemic hypertension     Medications:  See med rec  Assessment: 78 yo female presented to ED with shortness of breath. She is on chronic coumadin for afib and followed in anticoag clinic. Her INR remains therapeutic. She is on antibiotics so INR may drift upwards. Will monitor.  Home dose is 10mg  every Monday and Thursday;  7.5mg  all other days.  Goal of Therapy:  INR 2-3 Monitor platelets by anticoagulation protocol: Yes    Plan:  Coumadin 10mg  po x 1 today PT-INR daily  Monitor for S/S of bleeding  Isac Sarna, BS [place], BCPS Clinical Pharmacist Pager [phone] 08/19/2018,9:01 AM

## 2018-08-19 NOTE — Progress Notes (Signed)
Admit: 08/18/2018 LOS: 1  19F ESRD MWF with SOB  Subjective:  . HD yesterday, 1.1L UF; limited by cramping . On ceftriaxone/azithromycin for pneumonia . 4 L nasal cannula . Blood cultures from 1/12 x2, no growth to date  01/12 0701 - 01/13 0700 In: 574.4 [I.V.:225.3; IV Piggyback:349.2] Out: 1100   Filed Weights   08/18/18 1920 08/19/18 0500 08/19/18 0930  Weight: 100.4 kg 99 kg 99 kg    Scheduled Meds: . Chlorhexidine Gluconate Cloth  6 each Topical Q0600  . levalbuterol  0.63 mg Nebulization Q6H  . metoprolol tartrate  25 mg Oral QHS  . metoprolol tartrate  50 mg Oral Daily  . multivitamin  1 tablet Oral QHS  . pantoprazole  40 mg Oral Daily  . sevelamer carbonate  1,600 mg Oral TID WC  . sevelamer carbonate  800 mg Oral With snacks  . warfarin  10 mg Oral Once  . Warfarin - Pharmacist Dosing Inpatient   Does not apply q1800   Continuous Infusions: . sodium chloride    . sodium chloride    . azithromycin    . cefTRIAXone (ROCEPHIN)  IV    . diltiazem (CARDIZEM) infusion 10 mg/hr (08/19/18 0630)   PRN Meds:.sodium chloride, sodium chloride, acetaminophen, alteplase, guaiFENesin-dextromethorphan, heparin, lidocaine (PF), lidocaine-prilocaine, pentafluoroprop-tetrafluoroeth  Current Labs: reviewed    Physical Exam:  Blood pressure (!) 176/83, pulse (!) 106, temperature 99.2 F (37.3 C), temperature source Oral, resp. rate (!) 23, height 5\' 4"  (1.626 m), weight 99 kg, SpO2 94 %. WA/WD, NAD Tachycardic, irregular, normal S1 and S2, no rub Diffuse scattered expiratory wheezing, no rales, normal work of breathing Soft, nontender, obese/protuberant No lower extremity edema Left forearm AV fistula, cannulated, bruit and thrill present No rashes/skin lesions Nonfocal, alert and oriented x3, cranial nerves II through XII grossly intact NCAT EOMI  A 1. ESRD MWF [facility] FA AVF 2. SOB / CAP on CTX/Azithro; unsure how weights compare to outpt HD unit; s/p extra HD  08/18/18 limited by cramping 3. ANemia of CKD 4. CKD BMD on [place] 5. AFib with RVR, warfarin  P . HD currently, goal UF 2L, try UF profile 2 . Cont on MWF schedule . Medication Issues; o Preferred narcotic agents for pain control are hydromorphone, fentanyl, and methadone. Morphine should not be used.  o Baclofen should be avoided o Avoid oral sodium phosphate and magnesium citrate based laxatives / bowel preps    Pearson Grippe MD 08/19/2018, 10:24 AM  Recent Labs  Lab 08/18/18 1056 08/18/18 1106 08/19/18 0522  NA 137 136 139  K 4.4 4.5 4.0  CL 102 102 100  CO2 18*  --  28  GLUCOSE 242* 239* 121*  BUN 50* 41* 28*  CREATININE 9.57* 9.20* 6.34*  CALCIUM 10.1  --  9.6   Recent Labs  Lab 08/18/18 1056 08/18/18 1106 08/19/18 0522  WBC 10.6*  --  9.6  NEUTROABS 8.1*  --   --   HGB 9.3* 9.5* 8.9*  HCT 29.7* 28.0* 27.5*  MCV 107.2*  --  107.4*  PLT 301  --  248

## 2018-08-19 NOTE — Progress Notes (Signed)
PROGRESS NOTE  SEBASTIANA WUEST HUT:[id number] DOB: 1941-07-10 DOA: 08/18/2018 PCP: Redmond School, MD   LOS: 1 day   Brief Narrative / Interim history: HPI: Sabrina Beck is a 78 y.o. female with medical history significant of end-stage renal disease, permanent atrial fibrillation on chronic anticoagulation with Coumadin, chronic diastolic CHF, hypertension, GERD, presents to the hospital with chief complaint of significant shortness of breath.  She has been having upper respiratory type symptoms for the past 2 to 3 weeks, she was diagnosed with lobar pneumonia 3 days prior to admission as an outpatient and started on the Levaquin.  She felt worse and came to the ED, and was also found to be in pulmonary edema  Subjective: -Underwent dialysis last night, feeling a little bit better this morning, her breathing is better.  She still complains of a cough.  No chest pain, no palpitations.  Assessment & Plan: Principal Problem:   Acute respiratory failure with hypoxia (HCC) Active Problems:   Anemia   Atrial fibrillation (HCC)   HTN (hypertension)   Chronic diastolic CHF (congestive heart failure) (HCC)   ESRD (end stage renal disease) ([place])   HCAP (healthcare-associated pneumonia)   Pulmonary edema   Lobar pneumonia (HCC)   Principal Problem Acute hypoxic respiratory failure with hypoxia, likely multifactorial due to lobar pneumonia as well as a degree of pulmonary edema -Patient was admitted to stepdown and placed on supplemental oxygen.  Nephrology was consulted and underwent emergent dialysis on 1/12 evening, and will undergo repeat dialysis today 1/13 which is her normal dialysis day -Wheezing improved this morning, she is breathing more comfortable -Continue nebulizers, continue antibiotics, cultures are negative  Active Problems Atrial fibrillation, permanent, with RVR -This is likely in the setting of fluid overload, she was placed on Cardizem infusion, continue along with a  home metoprolol, suspect rate is worse due to acute hypoxia and fluid overload.  Will monitor heart rate after dialysis today, and if needed will uptitrate metoprolol and/or add Cardizem -Continue Coumadin per pharmacy  Hypertension -Blood pressure stable, continue current regimen  Anemia in the setting of end-stage renal disease -Hemoglobin stable, no bleeding  GERD -Continue home PPI  Acute on chronic diastolic CHF -Fluid status improving, to repeat dialysis today   Scheduled Meds: . Chlorhexidine Gluconate Cloth  6 each Topical Q0600  . levalbuterol  0.63 mg Nebulization Q6H  . metoprolol tartrate  25 mg Oral QHS  . metoprolol tartrate  50 mg Oral Daily  . multivitamin  1 tablet Oral QHS  . pantoprazole  40 mg Oral Daily  . sevelamer carbonate  1,600 mg Oral TID WC  . sevelamer carbonate  800 mg Oral With snacks  . warfarin  10 mg Oral Once  . Warfarin - Pharmacist Dosing Inpatient   Does not apply q1800   Continuous Infusions: . sodium chloride    . sodium chloride    . azithromycin    . cefTRIAXone (ROCEPHIN)  IV    . diltiazem (CARDIZEM) infusion 10 mg/hr (08/19/18 0630)   PRN Meds:.sodium chloride, sodium chloride, acetaminophen, alteplase, guaiFENesin-dextromethorphan, heparin, lidocaine (PF), lidocaine-prilocaine, pentafluoroprop-tetrafluoroeth  DVT prophylaxis: She is on Coumadin Code Status: Full code Family Communication: No family at bedside this morning Disposition Plan: Home when ready  Consultants:   Nephrology  Procedures:   None   Antimicrobials:  Ceftriaxone 1/12 >>  Azithromycin 1/12 >>  Objective: Vitals:   08/19/18 1005 08/19/18 1030 08/19/18 1100 08/19/18 1115  BP: (!) 154/74 (!) 148/82 (!) 141/94  137/87  Pulse: (!) 112 (!) 120 (!) 122 (!) 126  Resp:      Temp:    98.6 F (37 C)  TempSrc:    Oral  SpO2:    96%  Weight:      Height:        Intake/Output Summary (Last 24 hours) at 08/19/2018 1204 Last data filed at  08/19/2018 0800 Gross per 24 hour  Intake 589.44 ml  Output 1100 ml  Net -510.56 ml   Filed Weights   08/18/18 1920 08/19/18 0500 08/19/18 0930  Weight: 100.4 kg 99 kg 99 kg    Examination:  Constitutional: NAD Eyes: PERRL, lids and conjunctivae normal ENMT: Mucous membranes are moist.  Neck: normal, supple Respiratory: Bibasilar crackles heard, scant end expiratory wheezing, tachypneic, slightly increased work of breathing.  Moves air well. Cardiovascular: Irregularly irregular, tachycardic.  No peripheral edema Abdomen: no tenderness. Bowel sounds positive.  Musculoskeletal: no clubbing / cyanosis. Skin: no rashes Neurologic: CN 2-12 grossly intact. Strength 5/5 in all 4.  Psychiatric: Normal judgment and insight. Alert and oriented x 3. Normal mood.    Data Reviewed: I have independently reviewed following labs and imaging studies  Dependently reviewed chest x-ray, left lower lobe pneumonia along with bilateral interstitial edema  CBC: Recent Labs  Lab 08/18/18 1056 08/18/18 1106 08/19/18 0522  WBC 10.6*  --  9.6  NEUTROABS 8.1*  --   --   HGB 9.3* 9.5* 8.9*  HCT 29.7* 28.0* 27.5*  MCV 107.2*  --  107.4*  PLT 301  --  440   Basic Metabolic Panel: Recent Labs  Lab 08/18/18 1056 08/18/18 1106 08/19/18 0522  NA 137 136 139  K 4.4 4.5 4.0  CL 102 102 100  CO2 18*  --  28  GLUCOSE 242* 239* 121*  BUN 50* 41* 28*  CREATININE 9.57* 9.20* 6.34*  CALCIUM 10.1  --  9.6   GFR: Estimated Creatinine Clearance: 8.2 mL/min (A) (by C-G formula based on SCr of 6.34 mg/dL (H)). Liver Function Tests: Recent Labs  Lab 08/18/18 1056 08/19/18 0522  AST 33 24  ALT 36 31  ALKPHOS 65 57  BILITOT 1.2 1.0  PROT 6.8 6.4*  ALBUMIN 3.2* 2.9*   No results for input(s): LIPASE, AMYLASE in the last 168 hours. No results for input(s): AMMONIA in the last 168 hours. Coagulation Profile: Recent Labs  Lab 08/18/18 1125 08/19/18 0522  INR 2.32 2.42   Cardiac Enzymes: No  results for input(s): CKTOTAL, CKMB, CKMBINDEX, TROPONINI in the last 168 hours. BNP (last 3 results) No results for input(s): PROBNP in the last 8760 hours. HbA1C: No results for input(s): HGBA1C in the last 72 hours. CBG: No results for input(s): GLUCAP in the last 168 hours. Lipid Profile: No results for input(s): CHOL, HDL, LDLCALC, TRIG, CHOLHDL, LDLDIRECT in the last 72 hours. Thyroid Function Tests: No results for input(s): TSH, T4TOTAL, FREET4, T3FREE, THYROIDAB in the last 72 hours. Anemia Panel: No results for input(s): VITAMINB12, FOLATE, FERRITIN, TIBC, IRON, RETICCTPCT in the last 72 hours. Urine analysis:    Component Value Date/Time   COLORURINE YELLOW 03/03/2012 1500   APPEARANCEUR CLEAR 03/03/2012 1500   LABSPEC 1.020 03/03/2012 1500   PHURINE 6.0 03/03/2012 1500   GLUCOSEU 100 (A) 03/03/2012 1500   HGBUR TRACE (A) 03/03/2012 1500   BILIRUBINUR NEGATIVE 03/03/2012 1500   KETONESUR NEGATIVE 03/03/2012 1500   PROTEINUR 100 (A) 03/03/2012 1500   UROBILINOGEN 0.2 03/03/2012 1500   NITRITE NEGATIVE  03/03/2012 1500   LEUKOCYTESUR NEGATIVE 03/03/2012 1500   Sepsis Labs: Invalid input(s): PROCALCITONIN, LACTICIDVEN  Recent Results (from the past 240 hour(s))  Blood culture (routine x 2)     Status: None (Preliminary result)   Collection Time: 08/18/18 11:12 AM  Result Value Ref Range Status   Specimen Description BLOOD RIGHT HAND  Final   Special Requests   Final    BOTTLES DRAWN AEROBIC AND ANAEROBIC Blood Culture adequate volume   Culture   Final    NO GROWTH < 24 HOURS Performed at [facility], [address], [place], [place] [address]    Report Status PENDING  Incomplete  Blood culture (routine x 2)     Status: None (Preliminary result)   Collection Time: 08/18/18  1:50 PM  Result Value Ref Range Status   Specimen Description BLOOD RIGHT WRIST  Final   Special Requests   Final    BOTTLES DRAWN AEROBIC AND ANAEROBIC Blood Culture adequate volume    Culture   Final    NO GROWTH < 24 HOURS Performed at [facility], [address] [place], [place] [address]    Report Status PENDING  Incomplete  MRSA PCR Screening     Status: None   Collection Time: 08/18/18  2:15 PM  Result Value Ref Range Status   MRSA by PCR NEGATIVE NEGATIVE Final    Comment:        The GeneXpert MRSA Assay (FDA approved for NASAL specimens only), is one component of a comprehensive MRSA colonization surveillance program. It is not intended to diagnose MRSA infection nor to guide or monitor treatment for MRSA infections. Performed at [facility], [address], [place], [place] [address]       Radiology Studies: Dg Chest Portable 1 View  Result Date: 08/18/2018 CLINICAL DATA:  Shortness of breath.  History of pneumonia. EXAM: PORTABLE CHEST 1 VIEW COMPARISON:  Chest radiograph 08/15/2018 FINDINGS: Motion artifact limits exam. Monitoring leads overlie the patient. Stable cardiomegaly. Aortic atherosclerosis. Interval increase in consolidative opacities left upper lung. Interval development of diffuse bilateral interstitial pulmonary opacities. IMPRESSION: Increasing consolidation within the left upper lung which may represent pneumonia. Followup PA and lateral chest X-ray is recommended in 3-4 weeks following trial of antibiotic therapy to ensure resolution and exclude underlying malignancy. Interval development of diffuse bilateral interstitial opacities which may represent edema. Electronically Signed   By: Lovey Newcomer M.D.   On: 08/18/2018 11:47     Marzetta Board, MD, PhD [facility]  Contact via  [email or web address]  [place] P: [phone]  F: [phone]

## 2018-08-20 LAB — BASIC METABOLIC PANEL
Anion gap: 9 (ref 5–15)
BUN: 25 mg/dL — ABNORMAL HIGH (ref 8–23)
CO2: 28 mmol/L (ref 22–32)
CREATININE: 4.95 mg/dL — AB (ref 0.44–1.00)
Calcium: 9.4 mg/dL (ref 8.9–10.3)
Chloride: 102 mmol/L (ref 98–111)
GFR calc Af Amer: 9 mL/min — ABNORMAL LOW (ref >60)
GFR calc non Af Amer: 8 mL/min — ABNORMAL LOW (ref >60)
Glucose, Bld: 124 mg/dL — ABNORMAL HIGH (ref 70–99)
Potassium: 4.4 mmol/L (ref 3.5–5.1)
Sodium: 139 mmol/L (ref 135–145)

## 2018-08-20 LAB — CBC
HCT: 27.8 % — ABNORMAL LOW (ref 36.0–46.0)
Hemoglobin: 8.7 g/dL — ABNORMAL LOW (ref 12.0–15.0)
MCH: 34 pg (ref 26.0–34.0)
MCHC: 31.3 g/dL (ref 30.0–36.0)
MCV: 108.6 fL — AB (ref 80.0–100.0)
Platelets: 262 10*3/uL (ref 150–400)
RBC: 2.56 MIL/uL — ABNORMAL LOW (ref 3.87–5.11)
RDW: 13.2 % (ref 11.5–15.5)
WBC: 9.3 10*3/uL (ref 4.0–10.5)
nRBC: 0 % (ref 0.0–0.2)

## 2018-08-20 LAB — HEPATITIS B SURFACE ANTIGEN: Hepatitis B Surface Ag: NEGATIVE

## 2018-08-20 LAB — PROTIME-INR
INR: 2.34
Prothrombin Time: 25.3 seconds — ABNORMAL HIGH (ref 11.4–15.2)

## 2018-08-20 MED ORDER — METOPROLOL TARTRATE 50 MG PO TABS
100.0000 mg | ORAL_TABLET | Freq: Two times a day (BID) | ORAL | Status: DC
  Administered 2018-08-20 – 2018-08-22 (×4): 100 mg via ORAL
  Filled 2018-08-20 (×5): qty 2

## 2018-08-20 MED ORDER — CALCIUM ACETATE (PHOS BINDER) 667 MG PO CAPS
1334.0000 mg | ORAL_CAPSULE | Freq: Three times a day (TID) | ORAL | Status: DC
  Administered 2018-08-20 – 2018-08-23 (×9): 1334 mg via ORAL
  Filled 2018-08-20 (×9): qty 2

## 2018-08-20 MED ORDER — WARFARIN SODIUM 7.5 MG PO TABS
7.5000 mg | ORAL_TABLET | Freq: Once | ORAL | Status: AC
  Administered 2018-08-20: 7.5 mg via ORAL
  Filled 2018-08-20: qty 1

## 2018-08-20 MED ORDER — CHLORHEXIDINE GLUCONATE CLOTH 2 % EX PADS
6.0000 | MEDICATED_PAD | Freq: Every day | CUTANEOUS | Status: DC
  Administered 2018-08-20 – 2018-08-24 (×4): 6 via TOPICAL

## 2018-08-20 MED ORDER — BUDESONIDE 0.25 MG/2ML IN SUSP
0.2500 mg | Freq: Two times a day (BID) | RESPIRATORY_TRACT | Status: DC
  Administered 2018-08-20 – 2018-08-21 (×3): 0.25 mg via RESPIRATORY_TRACT
  Filled 2018-08-20 (×3): qty 2

## 2018-08-20 MED ORDER — PREDNISONE 20 MG PO TABS
20.0000 mg | ORAL_TABLET | Freq: Every day | ORAL | Status: DC
  Administered 2018-08-20 – 2018-08-21 (×2): 20 mg via ORAL
  Filled 2018-08-20 (×2): qty 1

## 2018-08-20 NOTE — Progress Notes (Signed)
ANTICOAGULATION CONSULT NOTE - follow up [place] for Coumadin Indication: atrial fibrillation  No Known Allergies  Patient Measurements: Height: 5\' 4"  (162.6 cm) Weight: 217 lb 13 oz (98.8 kg) IBW/kg (Calculated) : 54.7  Vital Signs: Temp: 98.1 F (36.7 C) (01/14 0724) Temp Source: Oral (01/14 0724) BP: 153/73 (01/14 0745) Pulse Rate: 95 (01/14 0745)  Labs: Recent Labs    08/18/18 1056 08/18/18 1106 08/18/18 1125 08/19/18 0522 08/20/18 0439  HGB 9.3* 9.5*  --  8.9* 8.7*  HCT 29.7* 28.0*  --  27.5* 27.8*  PLT 301  --   --  248 262  LABPROT  --   --  25.1* 26.0* 25.3*  INR  --   --  2.32 2.42 2.34  CREATININE 9.57* 9.20*  --  6.34* 4.95*    Estimated Creatinine Clearance: 10.5 mL/min (A) (by C-G formula based on SCr of 4.95 mg/dL (H)).   Medical History: Past Medical History:  Diagnosis Date  . Anemia 03/27/2011  . Atrial fibrillation ([place])   . Cancer ([place])    uterine  . Chest pain 05/15/2006   stress test perform negative for ischemia  . CHF (congestive heart failure) ([place])   . Complication of anesthesia   . Diverticulosis   . Dysrhythmia    afib  . Family history of anesthesia complication    "brother has nausea when put to sleep."  . GERD (gastroesophageal reflux disease)   . Headache   . Kidney disease    stage 4 chronic  . Mixed hyperlipidemia   . Murmur 06/04/2006,09/29/10   2D Echo perform both test shows EF 55%  . Peripheral neuropathy   . PONV (postoperative nausea and vomiting)   . Renal disorder   . Shortness of breath    with exertion  . Systemic hypertension     Medications:  See med rec  Assessment: 78 yo female presented to ED with shortness of breath. She is on chronic coumadin for afib and followed in anticoag clinic. Her INR remains therapeutic. She is on antibiotics so INR may drift upwards. Will monitor.  Home dose is 10mg  every Monday and Thursday;  7.5mg  all other days.  Goal of Therapy:  INR  2-3 Monitor platelets by anticoagulation protocol: Yes   Plan:  Coumadin 7.5 mg po x 1 today PT-INR daily  Monitor for S/S of bleeding  Margot Ables, PharmD Clinical Pharmacist 08/20/2018 8:22 AM

## 2018-08-20 NOTE — Progress Notes (Signed)
PROGRESS NOTE  Sabrina Beck WCB:[id number] DOB: May 15, 1941 DOA: 08/18/2018 PCP: Redmond School, MD   LOS: 2 days   Brief Narrative / Interim history: HPI: Sabrina Beck is a 78 y.o. female with medical history significant of end-stage renal disease, permanent atrial fibrillation on chronic anticoagulation with Coumadin, chronic diastolic CHF, hypertension, GERD, presents to the hospital with chief complaint of significant shortness of breath.  She has been having upper respiratory type symptoms for the past 2 to 3 weeks, she was diagnosed with lobar pneumonia 3 days prior to admission as an outpatient and started on the Levaquin.  She felt worse and came to the ED, and was also found to be in pulmonary edema as well as in A. fib with RVR  Subjective: -She underwent dialysis emergently Sunday night as well as Monday during the day, her breathing is improved but still requiring oxygen.  No chest pain, no palpitations this morning.  No abdominal pain, nausea or vomiting.  Asking when she can go home  Assessment & Plan: Principal Problem:   Acute respiratory failure with hypoxia ([place]) Active Problems:   Anemia   Atrial fibrillation (HCC)   HTN (hypertension)   Chronic diastolic CHF (congestive heart failure) (HCC)   ESRD (end stage renal disease) ([place])   HCAP (healthcare-associated pneumonia)   Pulmonary edema   Lobar pneumonia (HCC)   Principal Problem Acute hypoxic respiratory failure with hypoxia, likely multifactorial due to lobar pneumonia as well as a degree of pulmonary edema -Patient was admitted to stepdown and placed on supplemental oxygen.  Nephrology was consulted and underwent emergent dialysis on 1/12 evening, as well as 11/13 -Wheezing has improved but still there, she is breathing more comfortable -Continue nebulizers, continue antibiotics, cultures are negative -Due to persistent wheezing, will add Pulmicort as well as prednisone.  Patient does not have a history of  COPD or asthma,?  Reactive airway disease in the setting of pneumonia  Active Problems Atrial fibrillation, permanent, with RVR -This is likely in the setting of fluid overload, she was placed on Cardizem infusion, continue along with a home metoprolol, suspect rate is worse due to acute hypoxia and fluid overload.  -Continue Coumadin per pharmacy -She was dialyzed on 2 regardless last night heart rate was poorly controlled with ventricular rates in the 130s.  I will uptitrate her metoprolol to 100 twice daily, try to wean off Cardizem drip today  Hypertension -Blood pressure remained stable  Anemia in the setting of end-stage renal disease -Hemoglobin stable, no bleeding  GERD -Continue home PPI  Acute on chronic diastolic CHF -Fluid status improving, management per nephrology   Scheduled Meds: . budesonide (PULMICORT) nebulizer solution  0.25 mg Nebulization BID  . Chlorhexidine Gluconate Cloth  6 each Topical Q0600  . Chlorhexidine Gluconate Cloth  6 each Topical Q0600  . levalbuterol  0.63 mg Nebulization Q6H WA  . metoprolol tartrate  100 mg Oral BID  . multivitamin  1 tablet Oral QHS  . pantoprazole  40 mg Oral Daily  . predniSONE  20 mg Oral Q breakfast  . sevelamer carbonate  1,600 mg Oral TID WC  . sevelamer carbonate  800 mg Oral With snacks  . warfarin  7.5 mg Oral Once  . Warfarin - Pharmacist Dosing Inpatient   Does not apply q1800   Continuous Infusions: . sodium chloride    . sodium chloride    . azithromycin 500 mg (08/19/18 1459)  . cefTRIAXone (ROCEPHIN)  IV 1 g (08/19/18 1344)  .  diltiazem (CARDIZEM) infusion 10 mg/hr (08/20/18 0637)   PRN Meds:.sodium chloride, sodium chloride, acetaminophen, alteplase, guaiFENesin-dextromethorphan, heparin, lidocaine (PF), lidocaine-prilocaine, pentafluoroprop-tetrafluoroeth  DVT prophylaxis: She is on Coumadin Code Status: Full code Family Communication: No family at bedside this morning Disposition Plan: Home  when ready  Consultants:   Nephrology  Procedures:   Hemodialysis x2  Antimicrobials:  Ceftriaxone 1/12 >>  Azithromycin 1/12 >>  Objective: Vitals:   08/20/18 0724 08/20/18 0745 08/20/18 0828 08/20/18 0832  BP:  (!) 153/73    Pulse: (!) 56 95    Resp: (!) 22 18    Temp: 98.1 F (36.7 C)     TempSrc: Oral     SpO2: 97% 93% 96% 99%  Weight:      Height:        Intake/Output Summary (Last 24 hours) at 08/20/2018 1105 Last data filed at 08/20/2018 0637 Gross per 24 hour  Intake 1051.85 ml  Output 1200 ml  Net -148.15 ml   Filed Weights   08/19/18 0930 08/19/18 1315 08/20/18 0615  Weight: 99 kg 98 kg 98.8 kg    Examination:  Constitutional: NAD Eyes: No scleral icterus ENMT: mmm Neck: normal, supple Respiratory: Bilateral end expiratory wheezing, tachypneic, moves air well Cardiovascular: Irregularly irregular, tachycardic, no murmurs appreciated.  No peripheral edema Abdomen: Soft, nontender, nondistended, positive bowel sounds Musculoskeletal: no clubbing / cyanosis. Skin: No rashes seen Neurologic: No focal deficits, equal strength, cranial nerves grossly intact Psychiatric: Normal judgment and insight. Alert and oriented x 3. Normal mood.    Data Reviewed: I have independently reviewed following labs and imaging studies  Dependently reviewed chest x-ray, left lower lobe pneumonia along with bilateral interstitial edema  CBC: Recent Labs  Lab 08/18/18 1056 08/18/18 1106 08/19/18 0522 08/20/18 0439  WBC 10.6*  --  9.6 9.3  NEUTROABS 8.1*  --   --   --   HGB 9.3* 9.5* 8.9* 8.7*  HCT 29.7* 28.0* 27.5* 27.8*  MCV 107.2*  --  107.4* 108.6*  PLT 301  --  248 431   Basic Metabolic Panel: Recent Labs  Lab 08/18/18 1056 08/18/18 1106 08/19/18 0522 08/20/18 0439  NA 137 136 139 139  K 4.4 4.5 4.0 4.4  CL 102 102 100 102  CO2 18*  --  28 28  GLUCOSE 242* 239* 121* 124*  BUN 50* 41* 28* 25*  CREATININE 9.57* 9.20* 6.34* 4.95*  CALCIUM 10.1  --   9.6 9.4   GFR: Estimated Creatinine Clearance: 10.5 mL/min (A) (by C-G formula based on SCr of 4.95 mg/dL (H)). Liver Function Tests: Recent Labs  Lab 08/18/18 1056 08/19/18 0522  AST 33 24  ALT 36 31  ALKPHOS 65 57  BILITOT 1.2 1.0  PROT 6.8 6.4*  ALBUMIN 3.2* 2.9*   No results for input(s): LIPASE, AMYLASE in the last 168 hours. No results for input(s): AMMONIA in the last 168 hours. Coagulation Profile: Recent Labs  Lab 08/18/18 1125 08/19/18 0522 08/20/18 0439  INR 2.32 2.42 2.34   Cardiac Enzymes: No results for input(s): CKTOTAL, CKMB, CKMBINDEX, TROPONINI in the last 168 hours. BNP (last 3 results) No results for input(s): PROBNP in the last 8760 hours. HbA1C: No results for input(s): HGBA1C in the last 72 hours. CBG: No results for input(s): GLUCAP in the last 168 hours. Lipid Profile: No results for input(s): CHOL, HDL, LDLCALC, TRIG, CHOLHDL, LDLDIRECT in the last 72 hours. Thyroid Function Tests: No results for input(s): TSH, T4TOTAL, FREET4, T3FREE, THYROIDAB in the  last 72 hours. Anemia Panel: No results for input(s): VITAMINB12, FOLATE, FERRITIN, TIBC, IRON, RETICCTPCT in the last 72 hours. Urine analysis:    Component Value Date/Time   COLORURINE YELLOW 03/03/2012 1500   APPEARANCEUR CLEAR 03/03/2012 1500   LABSPEC 1.020 03/03/2012 1500   PHURINE 6.0 03/03/2012 1500   GLUCOSEU 100 (A) 03/03/2012 1500   HGBUR TRACE (A) 03/03/2012 1500   BILIRUBINUR NEGATIVE 03/03/2012 1500   KETONESUR NEGATIVE 03/03/2012 1500   PROTEINUR 100 (A) 03/03/2012 1500   UROBILINOGEN 0.2 03/03/2012 1500   NITRITE NEGATIVE 03/03/2012 1500   LEUKOCYTESUR NEGATIVE 03/03/2012 1500   Sepsis Labs: Invalid input(s): PROCALCITONIN, LACTICIDVEN  Recent Results (from the past 240 hour(s))  Blood culture (routine x 2)     Status: None (Preliminary result)   Collection Time: 08/18/18 11:12 AM  Result Value Ref Range Status   Specimen Description BLOOD RIGHT HAND  Final    Special Requests   Final    BOTTLES DRAWN AEROBIC AND ANAEROBIC Blood Culture adequate volume   Culture   Final    NO GROWTH 2 DAYS Performed at [facility], [address] [place], [place] [address]    Report Status PENDING  Incomplete  Blood culture (routine x 2)     Status: None (Preliminary result)   Collection Time: 08/18/18  1:50 PM  Result Value Ref Range Status   Specimen Description BLOOD RIGHT WRIST  Final   Special Requests   Final    BOTTLES DRAWN AEROBIC AND ANAEROBIC Blood Culture adequate volume   Culture   Final    NO GROWTH 2 DAYS Performed at [facility], [address] [place], [place] [address]    Report Status PENDING  Incomplete  Culture, sputum-assessment     Status: None   Collection Time: 08/18/18  2:15 PM  Result Value Ref Range Status   Specimen Description EXPECTORATED SPUTUM  Final   Special Requests NONE  Final   Sputum evaluation   Final    THIS SPECIMEN IS ACCEPTABLE FOR SPUTUM CULTURE Performed at [facility], [address] [place], [place] [address]    Report Status 08/19/2018 FINAL  Final  MRSA PCR Screening     Status: None   Collection Time: 08/18/18  2:15 PM  Result Value Ref Range Status   MRSA by PCR NEGATIVE NEGATIVE Final    Comment:        The GeneXpert MRSA Assay (FDA approved for NASAL specimens only), is one component of a comprehensive MRSA colonization surveillance program. It is not intended to diagnose MRSA infection nor to guide or monitor treatment for MRSA infections. Performed at [facility], [address], [place], [place] [address]       Radiology Studies: Dg Chest Portable 1 View  Result Date: 08/18/2018 CLINICAL DATA:  Shortness of breath.  History of pneumonia. EXAM: PORTABLE CHEST 1 VIEW COMPARISON:  Chest radiograph 08/15/2018 FINDINGS: Motion artifact limits exam. Monitoring leads overlie the patient. Stable cardiomegaly. Aortic atherosclerosis. Interval increase in consolidative opacities left upper  lung. Interval development of diffuse bilateral interstitial pulmonary opacities. IMPRESSION: Increasing consolidation within the left upper lung which may represent pneumonia. Followup PA and lateral chest X-ray is recommended in 3-4 weeks following trial of antibiotic therapy to ensure resolution and exclude underlying malignancy. Interval development of diffuse bilateral interstitial opacities which may represent edema. Electronically Signed   By: Lovey Newcomer M.D.   On: 08/18/2018 11:47     Marzetta Board, MD, PhD [facility]  Contact via  [email or web address]  [place] P: [phone]  F: [phone]

## 2018-08-20 NOTE — Progress Notes (Signed)
Admit: 08/18/2018 LOS: 2  86F ESRD MWF with SOB  Subjective:  . HD yesterday on schedule, 1.2L UF post weight 98kg.  Down 3.4kg from peak weight in hospital . BP stable, on dilt gtt for RVR . On ceftriaxone/azithromycin for pneumonia . 4 L nasal cannula . Breathing is improved, not yet OOB . Blood cultures from 1/12 x2, no growth to date  01/13 0701 - 01/14 0700 In: 1066.9 [P.O.:500; I.V.:216.9; IV Piggyback:350] Out: 1200   Filed Weights   08/19/18 0930 08/19/18 1315 08/20/18 0615  Weight: 99 kg 98 kg 98.8 kg    Scheduled Meds: . budesonide (PULMICORT) nebulizer solution  0.25 mg Nebulization BID  . Chlorhexidine Gluconate Cloth  6 each Topical Q0600  . levalbuterol  0.63 mg Nebulization Q6H WA  . metoprolol tartrate  100 mg Oral BID  . multivitamin  1 tablet Oral QHS  . pantoprazole  40 mg Oral Daily  . predniSONE  20 mg Oral Q breakfast  . sevelamer carbonate  1,600 mg Oral TID WC  . sevelamer carbonate  800 mg Oral With snacks  . warfarin  7.5 mg Oral Once  . Warfarin - Pharmacist Dosing Inpatient   Does not apply q1800   Continuous Infusions: . sodium chloride    . sodium chloride    . azithromycin 500 mg (08/19/18 1459)  . cefTRIAXone (ROCEPHIN)  IV 1 g (08/19/18 1344)  . diltiazem (CARDIZEM) infusion 10 mg/hr (08/20/18 0637)   PRN Meds:.sodium chloride, sodium chloride, acetaminophen, alteplase, guaiFENesin-dextromethorphan, heparin, lidocaine (PF), lidocaine-prilocaine, pentafluoroprop-tetrafluoroeth  Current Labs: reviewed    Physical Exam:  Blood pressure (!) 153/73, pulse 95, temperature 98.1 F (36.7 C), temperature source Oral, resp. rate 18, height 5\' 4"  (1.626 m), weight 98.8 kg, SpO2 99 %. WA/WD, NAD Normal rate, irregular, normal S1 and S2, no rub Ongoing diffuse scattered expiratory wheezing, no rales, normal work of breathing Soft, nontender, obese/protuberant No lower extremity edema Left forearm AV fistula, cannulated, bruit and thrill  present No rashes/skin lesions Nonfocal, alert and oriented x3, cranial nerves II through XII grossly intact NCAT EOMI  A 1. ESRD MWF [facility] FA AVF 2. SOB / CAP on CTX/Azithro; unsure how weights compare to outpt HD unit; s/p extra HD 08/18/18 limited by cramping; 3.9kg down from admission, good SpO2 on [place] 3. ANemia of CKD 4. CKD BMD on [place] 5. AFib with RVR, warfarin, diltiazem gtt  P . Cont HD on schedule tomrorow if still admitted: 4h, 2K, AVF 16g, Qb 350, Goal 2L UF, no heparin . Medication Issues; o Preferred narcotic agents for pain control are hydromorphone, fentanyl, and methadone. Morphine should not be used.  o Baclofen should be avoided o Avoid oral sodium phosphate and magnesium citrate based laxatives / bowel preps    Pearson Grippe MD 08/20/2018, 8:44 AM  Recent Labs  Lab 08/18/18 1056 08/18/18 1106 08/19/18 0522 08/20/18 0439  NA 137 136 139 139  K 4.4 4.5 4.0 4.4  CL 102 102 100 102  CO2 18*  --  28 28  GLUCOSE 242* 239* 121* 124*  BUN 50* 41* 28* 25*  CREATININE 9.57* 9.20* 6.34* 4.95*  CALCIUM 10.1  --  9.6 9.4   Recent Labs  Lab 08/18/18 1056 08/18/18 1106 08/19/18 0522 08/20/18 0439  WBC 10.6*  --  9.6 9.3  NEUTROABS 8.1*  --   --   --   HGB 9.3* 9.5* 8.9* 8.7*  HCT 29.7* 28.0* 27.5* 27.8*  MCV 107.2*  --  107.4* 108.6*  PLT 301  --  248 262

## 2018-08-21 DIAGNOSIS — J189 Pneumonia, unspecified organism: Secondary | ICD-10-CM

## 2018-08-21 LAB — BASIC METABOLIC PANEL
Anion gap: 12 (ref 5–15)
BUN: 47 mg/dL — ABNORMAL HIGH (ref 8–23)
CO2: 27 mmol/L (ref 22–32)
Calcium: 10.1 mg/dL (ref 8.9–10.3)
Chloride: 100 mmol/L (ref 98–111)
Creatinine, Ser: 7.34 mg/dL — ABNORMAL HIGH (ref 0.44–1.00)
GFR calc Af Amer: 6 mL/min — ABNORMAL LOW (ref >60)
GFR calc non Af Amer: 5 mL/min — ABNORMAL LOW (ref >60)
GLUCOSE: 110 mg/dL — AB (ref 70–99)
Potassium: 4.7 mmol/L (ref 3.5–5.1)
Sodium: 139 mmol/L (ref 135–145)

## 2018-08-21 LAB — PROTIME-INR
INR: 3.04
PROTHROMBIN TIME: 31 s — AB (ref 11.4–15.2)

## 2018-08-21 LAB — CBC
HCT: 25.6 % — ABNORMAL LOW (ref 36.0–46.0)
Hemoglobin: 8 g/dL — ABNORMAL LOW (ref 12.0–15.0)
MCH: 34.2 pg — ABNORMAL HIGH (ref 26.0–34.0)
MCHC: 31.3 g/dL (ref 30.0–36.0)
MCV: 109.4 fL — AB (ref 80.0–100.0)
PLATELETS: 280 10*3/uL (ref 150–400)
RBC: 2.34 MIL/uL — ABNORMAL LOW (ref 3.87–5.11)
RDW: 12.9 % (ref 11.5–15.5)
WBC: 10.1 10*3/uL (ref 4.0–10.5)
nRBC: 0 % (ref 0.0–0.2)

## 2018-08-21 MED ORDER — DOXERCALCIFEROL 4 MCG/2ML IV SOLN
8.0000 ug | INTRAVENOUS | Status: DC
  Administered 2018-08-21 – 2018-08-23 (×2): 8 ug via INTRAVENOUS
  Filled 2018-08-21 (×2): qty 4

## 2018-08-21 MED ORDER — EPOETIN ALFA 3000 UNIT/ML IJ SOLN
2600.0000 [IU] | INTRAMUSCULAR | Status: DC
  Administered 2018-08-21 – 2018-08-23 (×2): 2600 [IU] via INTRAVENOUS

## 2018-08-21 MED ORDER — BUDESONIDE 0.5 MG/2ML IN SUSP
0.5000 mg | Freq: Two times a day (BID) | RESPIRATORY_TRACT | Status: DC
  Administered 2018-08-21 – 2018-08-24 (×6): 0.5 mg via RESPIRATORY_TRACT
  Filled 2018-08-21 (×6): qty 2

## 2018-08-21 MED ORDER — METOPROLOL TARTRATE 5 MG/5ML IV SOLN
5.0000 mg | Freq: Once | INTRAVENOUS | Status: AC
  Administered 2018-08-21: 5 mg via INTRAVENOUS
  Filled 2018-08-21: qty 5

## 2018-08-21 MED ORDER — PREDNISONE 20 MG PO TABS
40.0000 mg | ORAL_TABLET | Freq: Every day | ORAL | Status: DC
  Administered 2018-08-22 – 2018-08-24 (×3): 40 mg via ORAL
  Filled 2018-08-21 (×3): qty 2

## 2018-08-21 MED ORDER — AZITHROMYCIN 250 MG PO TABS
500.0000 mg | ORAL_TABLET | Freq: Every day | ORAL | Status: DC
  Administered 2018-08-21 – 2018-08-24 (×4): 500 mg via ORAL
  Filled 2018-08-21 (×4): qty 2

## 2018-08-21 MED ORDER — METHYLPREDNISOLONE SODIUM SUCC 40 MG IJ SOLR
40.0000 mg | Freq: Once | INTRAMUSCULAR | Status: AC
  Administered 2018-08-21: 40 mg via INTRAVENOUS
  Filled 2018-08-21: qty 1

## 2018-08-21 MED ORDER — IPRATROPIUM BROMIDE 0.02 % IN SOLN
0.5000 mg | Freq: Four times a day (QID) | RESPIRATORY_TRACT | Status: DC
  Administered 2018-08-21 – 2018-08-23 (×9): 0.5 mg via RESPIRATORY_TRACT
  Filled 2018-08-21 (×9): qty 2.5

## 2018-08-21 NOTE — Plan of Care (Signed)
  Problem: Acute Rehab PT Goals(only PT should resolve) Goal: Pt Will Go Supine/Side To Sit Flowsheets (Taken 08/21/2018 1502) Pt will go Supine/Side to Sit: with supervision Goal: Patient Will Transfer Sit To/From Stand Flowsheets (Taken 08/21/2018 1502) Patient will transfer sit to/from stand: with supervision Goal: Pt Will Transfer Bed To Chair/Chair To Bed Flowsheets (Taken 08/21/2018 1502) Pt will Transfer Bed to Chair/Chair to Bed: with supervision Goal: Pt Will Ambulate Flowsheets (Taken 08/21/2018 1502) Pt will Ambulate: 50 feet; with supervision; with rolling walker  Use cane if possible  3:02 PM, 08/21/18 Lonell Grandchild, MPT Physical Therapist with [facility] [phone] office [phone] mobile phone

## 2018-08-21 NOTE — Care Management Important Message (Signed)
Important Message  Patient Details  Name: [name] MRN: [id number] Date of Birth: [birth year]   Medicare Important Message Given:  Yes    [name] 08/21/2018, 12:10 PM

## 2018-08-21 NOTE — Progress Notes (Signed)
PROGRESS NOTE  JERIANNE ANSELMO FUX:[id number] DOB: 1940-08-11 DOA: 08/18/2018 PCP: Redmond School, MD  Brief History:  78 y.o.femalewith medical history significant ofend-stage renal disease, permanent atrial fibrillation on chronic anticoagulation with Coumadin, chronic diastolic CHF, hypertension, GERD, presents to the hospital with chief complaint of significant shortness of breath.  She has been having upper respiratory type symptoms for the past 2 to 3 weeks, she was diagnosed with lobar pneumonia 3 days prior to admission as an outpatient and started on the Levaquin.  She felt worse and came to the ED, and was also found to be in pulmonary edema as well as in A. fib with RVR  Assessment/Plan: Acute hypoxic respiratory failure with hypoxia -multifactorial due tolobar pneumoniaas well as  pulmonary edema -Patient was admitted to stepdown and placed on supplemental oxygen.  Nephrology was consulted and underwent emergent dialysis on 1/12 evening, as well as 11/13 -Wheezing has improved but still there, she is breathing more comfortable -Continue nebulizers, continue antibiotics, cultures are negative -Increase pulmicort to 0.5 mg bid -pt endorsed some indiscretion with fluids -Echo  Bronchospasm -Increase pulmicort to 0.5 mg bid -increase prednisone to 40 mg daily -add atrovent neb  Atrial fibrillation, permanent, with RVR -This is likely in the setting of fluid overload,  -she was placed on Cardizem infusion, continue along with a home metoprolol,  -rate worse due to acute hypoxia and fluid overload.   --Continue Coumadin per pharmacy -metoprolol increased to 100 mg bid  Hypertension -Blood pressure remained stable  Anemia in the setting of end-stage renal disease -Hemoglobin stable, no bleeding  GERD -Continue home PPI    Disposition Plan:   Home 1/16 if stable Family Communication:   Son updated at bedside 1/15  Consultants:  renal  Code Status:   FULL   DVT Prophylaxis:  Coumadin   Procedures: As Listed in Progress Note Above  Antibiotics: Ceftriaxone 1/12>>> Azithromycin 1/12>>    Total time spent 35 minutes.  Greater than 50% spent face to face counseling and coordinating care.     Subjective: Patient still feels a little short of breath with minimal exertion.  She denies any headache, chest pain, nausea, vomiting, diarrhea, domino pain.  There is no fevers, chills, headache, neck pain.  Objective: Vitals:   08/21/18 0945 08/21/18 1000 08/21/18 1015 08/21/18 1030  BP: 138/70 (!) 148/67 139/76 140/77  Pulse: 97 96 100 (!) 104  Resp: (!) 22 18 20 18   Temp:      TempSrc:      SpO2:    93%  Weight:      Height:        Intake/Output Summary (Last 24 hours) at 08/21/2018 1039 Last data filed at 08/21/2018 0000 Gross per 24 hour  Intake 431.07 ml  Output -  Net 431.07 ml   Weight change: 0.5 kg Exam:   General:  Pt is alert, follows commands appropriately, not in acute distress  HEENT: No icterus, No thrush, No neck mass, [place]/AT  Cardiovascular: IRRR, S1/S2, no rubs, no gallops  Respiratory: Mild bibasilar expiratory wheeze.  Bilateral scattered crackles.  Abdomen: Soft/+BS, non tender, non distended, no guarding  Extremities: No edema, No lymphangitis, No petechiae, No rashes, no synovitis   Data Reviewed: I have personally reviewed following labs and imaging studies Basic Metabolic Panel: Recent Labs  Lab 08/18/18 1056 08/18/18 1106 08/19/18 0522 08/20/18 0439 08/21/18 0411  NA 137 136 139 139 139  K 4.4  4.5 4.0 4.4 4.7  CL 102 102 100 102 100  CO2 18*  --  28 28 27   GLUCOSE 242* 239* 121* 124* 110*  BUN 50* 41* 28* 25* 47*  CREATININE 9.57* 9.20* 6.34* 4.95* 7.34*  CALCIUM 10.1  --  9.6 9.4 10.1   Liver Function Tests: Recent Labs  Lab 08/18/18 1056 08/19/18 0522  AST 33 24  ALT 36 31  ALKPHOS 65 57  BILITOT 1.2 1.0  PROT 6.8 6.4*  ALBUMIN 3.2* 2.9*   No results for input(s):  LIPASE, AMYLASE in the last 168 hours. No results for input(s): AMMONIA in the last 168 hours. Coagulation Profile: Recent Labs  Lab 08/18/18 1125 08/19/18 0522 08/20/18 0439 08/21/18 0411  INR 2.32 2.42 2.34 3.04   CBC: Recent Labs  Lab 08/18/18 1056 08/18/18 1106 08/19/18 0522 08/20/18 0439 08/21/18 0411  WBC 10.6*  --  9.6 9.3 10.1  NEUTROABS 8.1*  --   --   --   --   HGB 9.3* 9.5* 8.9* 8.7* 8.0*  HCT 29.7* 28.0* 27.5* 27.8* 25.6*  MCV 107.2*  --  107.4* 108.6* 109.4*  PLT 301  --  248 262 280   Cardiac Enzymes: No results for input(s): CKTOTAL, CKMB, CKMBINDEX, TROPONINI in the last 168 hours. BNP: Invalid input(s): POCBNP CBG: No results for input(s): GLUCAP in the last 168 hours. HbA1C: No results for input(s): HGBA1C in the last 72 hours. Urine analysis:    Component Value Date/Time   COLORURINE YELLOW 03/03/2012 1500   APPEARANCEUR CLEAR 03/03/2012 1500   LABSPEC 1.020 03/03/2012 1500   PHURINE 6.0 03/03/2012 1500   GLUCOSEU 100 (A) 03/03/2012 1500   HGBUR TRACE (A) 03/03/2012 1500   BILIRUBINUR NEGATIVE 03/03/2012 1500   KETONESUR NEGATIVE 03/03/2012 1500   PROTEINUR 100 (A) 03/03/2012 1500   UROBILINOGEN 0.2 03/03/2012 1500   NITRITE NEGATIVE 03/03/2012 1500   LEUKOCYTESUR NEGATIVE 03/03/2012 1500   Sepsis Labs: @LABRCNTIP (procalcitonin:4,lacticidven:4) ) Recent Results (from the past 240 hour(s))  Blood culture (routine x 2)     Status: None (Preliminary result)   Collection Time: 08/18/18 11:12 AM  Result Value Ref Range Status   Specimen Description BLOOD RIGHT HAND  Final   Special Requests   Final    BOTTLES DRAWN AEROBIC AND ANAEROBIC Blood Culture adequate volume   Culture   Final    NO GROWTH 3 DAYS Performed at [facility] [address] [place], [place] [address]    Report Status PENDING  Incomplete  Blood culture (routine x 2)     Status: None (Preliminary result)   Collection Time: 08/18/18  1:50 PM  Result Value Ref Range  Status   Specimen Description BLOOD RIGHT WRIST  Final   Special Requests   Final    BOTTLES DRAWN AEROBIC AND ANAEROBIC Blood Culture adequate volume   Culture   Final    NO GROWTH 3 DAYS Performed at [facility], [address], [place], [place] [address]    Report Status PENDING  Incomplete  Culture, sputum-assessment     Status: None   Collection Time: 08/18/18  2:15 PM  Result Value Ref Range Status   Specimen Description EXPECTORATED SPUTUM  Final   Special Requests NONE  Final   Sputum evaluation   Final    THIS SPECIMEN IS ACCEPTABLE FOR SPUTUM CULTURE Performed at [facility], [address], [place], [place] [address]    Report Status 08/19/2018 FINAL  Final  MRSA PCR Screening  Status: None   Collection Time: 08/18/18  2:15 PM  Result Value Ref Range Status   MRSA by PCR NEGATIVE NEGATIVE Final    Comment:        The GeneXpert MRSA Assay (FDA approved for NASAL specimens only), is one component of a comprehensive MRSA colonization surveillance program. It is not intended to diagnose MRSA infection nor to guide or monitor treatment for MRSA infections. Performed at [facility], [address] [place], [place] [address]   Culture, respiratory     Status: None (Preliminary result)   Collection Time: 08/18/18  2:15 PM  Result Value Ref Range Status   Specimen Description   Final    EXPECTORATED SPUTUM Performed at [facility], [address] [place], [place] [address]    Special Requests   Final    NONE Reflexed from [id number] Performed at [facility], [address] [place], [place] [address]    Gram Stain PENDING  Incomplete   Culture   Final    CULTURE REINCUBATED FOR BETTER GROWTH Performed at [facility], [place] [address], [place],  [address]    Report Status PENDING  Incomplete     Scheduled Meds: . budesonide (PULMICORT) nebulizer solution  0.25 mg Nebulization BID  . calcium acetate  1,334 mg Oral TID WC  . Chlorhexidine Gluconate  Cloth  6 each Topical Q0600  . Chlorhexidine Gluconate Cloth  6 each Topical Q0600  . levalbuterol  0.63 mg Nebulization Q6H WA  . metoprolol tartrate  100 mg Oral BID  . multivitamin  1 tablet Oral QHS  . pantoprazole  40 mg Oral Daily  . predniSONE  20 mg Oral Q breakfast  . sevelamer carbonate  1,600 mg Oral TID WC  . sevelamer carbonate  800 mg Oral With snacks  . Warfarin - Pharmacist Dosing Inpatient   Does not apply q1800   Continuous Infusions: . sodium chloride    . sodium chloride    . azithromycin Stopped (08/20/18 1245)  . cefTRIAXone (ROCEPHIN)  IV Stopped (08/20/18 1135)  . diltiazem (CARDIZEM) infusion Stopped (08/20/18 1444)    Procedures/Studies: Dg Chest 2 View  Result Date: 08/16/2018 CLINICAL DATA:  Cough with yellow phlegm, shortness of breath, and chest discomfort for a few weeks. EXAM: CHEST - 2 VIEW COMPARISON:  02/13/2018 FINDINGS: Patchy left perihilar airspace opacities of developed. Cardiomegaly. Low volumes. No pneumothorax. No pleural effusion. Right lung is relatively clear. IMPRESSION: Left perihilar airspace disease is worrisome for bronchopneumonia. Followup PA and lateral chest X-ray is recommended in 3-4 weeks following trial of antibiotic therapy to ensure resolution and exclude underlying malignancy. Electronically Signed   By: Marybelle Killings M.D.   On: 08/16/2018 08:34   Dg Chest Portable 1 View  Result Date: 08/18/2018 CLINICAL DATA:  Shortness of breath.  History of pneumonia. EXAM: PORTABLE CHEST 1 VIEW COMPARISON:  Chest radiograph 08/15/2018 FINDINGS: Motion artifact limits exam. Monitoring leads overlie the patient. Stable cardiomegaly. Aortic atherosclerosis. Interval increase in consolidative opacities left upper lung. Interval development of diffuse bilateral interstitial pulmonary opacities. IMPRESSION: Increasing consolidation within the left upper lung which may represent pneumonia. Followup PA and lateral chest X-ray is recommended in 3-4  weeks following trial of antibiotic therapy to ensure resolution and exclude underlying malignancy. Interval development of diffuse bilateral interstitial opacities which may represent edema. Electronically Signed   By: Lovey Newcomer M.D.   On: 08/18/2018 11:47    Orson Eva, DO  [facility] Pager [phone]  If 7PM-7AM, please contact night-coverage [email or web address] Password [email or web address] 08/21/2018, 10:39 AM   LOS: 3 days

## 2018-08-21 NOTE — Progress Notes (Addendum)
Physical therapy in with patient. Able to get a standing weight post-dialysis of 213.2 lb. O2 stats while resting on room air 97%. While ambulating without O2 pt maintained O2 around 91% however did desaturate down to 87% upon return to room and HR briefly elevated to 160's. Once pt was back in room and sitting in recliner on 2 L O2 pt HR in 120's and saturating at 93%.   Will revaluate since pt ambulated immediately post-dialysis.

## 2018-08-21 NOTE — Procedures (Addendum)
   HEMODIALYSIS TREATMENT NOTE (Wed 15 Jan):  Uneventful 4 hour heparin-free dialysis completed.  Left forearm AVF tolerated prescribed Qb 350 with stable VP/AP.  Goal met: 2 liters removed without interruption in ultrafiltration.  All blood was returned and hemostasis was achieved in 15 minutes.    Pt has order for PT evaluation later today.  Physical therapist Jeneen Rinks) will obtain standing weight if pt is agreeable; otherwise, I'll check back this evening and obtain standing weight then.    Rockwell Alexandria, RN    Addendum:  Standing post-HD weight 96.7kg (thank you, Jeneen Rinks) communicated to Dr. Lowanda Foster as current EDW is 102 kg.

## 2018-08-21 NOTE — Evaluation (Signed)
Physical Therapy Evaluation Patient Details Name: Sabrina Beck MRN: [id number] DOB: 06-Jun-1941 Today's Date: 08/21/2018   History of Present Illness  Sabrina Beck is a 78 y.o. female with medical history significant of end-stage renal disease, permanent atrial fibrillation on chronic anticoagulation with Coumadin, chronic diastolic CHF, hypertension, GERD, presents to the hospital with chief complaint of significant shortness of breath.  Patient tells me that she has been bothered by URI type symptoms with sinus congestion, cough, for the past 2 to 3 weeks.  She continued to feel bad, and was evaluated by her cardiologist as an outpatient about 3 days ago, and chest x-ray at that time showed left perihilar airspace disease concerning for pneumonia.  She was placed on Levaquin.  Her symptoms have progressed in the last couple of days, had significant difficulties with breathing, and decided to come to the emergency room.  On arrival she was found to be hypoxic with oxygen sats in the low 80s on room air is requiring nasal cannula, as well as A. fib with RVR.  On my evaluation, patient appears tachypneic but relatively comfortable on 4 L nasal cannula.  She is not short of breath when she came in.  She denies any chest pain, denies any palpitations.  She denies any fevers but did say that she has had some subjective chills.  She complains of a cough with yellow sputum production.  She denies any abdominal pain, no nausea vomiting or diarrhea.  She states that she got dialyzed last yesterday, has not missed any sessions, however tells me that she does not think she had any fluid pulled on dialysis this whole week, "they just cleaned my blood".    Clinical Impression  Patient demonstrates slow labored movement with difficulty sitting up at bedside with bed flat, frequent reaching for nearby objects for support due to BLE weakness, required use of RW for safety and able to ambulate in room/hallway without  loss of balance, limited mostly due to SOB and increased HR above 150 BPM, O2 desaturation to 87% while on room air- RN aware.  Patient tolerated sitting up in chair after therapy.  Patient will benefit from continued physical therapy in hospital and recommended venue below to increase strength, balance, endurance for safe ADLs and gait.    Follow Up Recommendations Home health PT;Supervision for mobility/OOB;Supervision - Intermittent    Equipment Recommendations  Rolling walker with 5" wheels    Recommendations for Other Services       Precautions / Restrictions Precautions Precautions: Fall Restrictions Weight Bearing Restrictions: No      Mobility  Bed Mobility Overal bed mobility: Needs Assistance Bed Mobility: Supine to Sit     Supine to sit: Min assist     General bed mobility comments: slow labored movement with bed flat  Transfers Overall transfer level: Needs assistance Equipment used: Rolling walker (2 wheeled) Transfers: Sit to/from [facility] Sit to Stand: Min guard Stand pivot transfers: Min guard       General transfer comment: slow labored movement  Ambulation/Gait Ambulation/Gait assistance: Min assist;Min guard Gait Distance (Feet): 30 Feet Assistive device: Rolling walker (2 wheeled);1 person hand held assist Gait Pattern/deviations: Decreased step length - right;Decreased step length - left;Decreased stride length Gait velocity: slow   General Gait Details: slow labored cadence with tendency to lean on nearby objects for support with hand held assist, required use of RW for safety  Stairs  Wheelchair Mobility    Modified Rankin (Stroke Patients Only)       Balance Overall balance assessment: Needs assistance Sitting-balance support: Feet supported;No upper extremity supported Sitting balance-Leahy Scale: Good     Standing balance support: During functional activity;No upper extremity  supported Standing balance-Leahy Scale: Poor Standing balance comment: fair/poor without AD, fair using RW                             Pertinent Vitals/Pain Pain Assessment: No/denies pain    Home Living Family/patient expects to be discharged to:: Private residence Living Arrangements: Children Available Help at Discharge: Family Type of Home: House Home Access: Stairs to enter Entrance Stairs-Rails: None Entrance Stairs-Number of Steps: 1 Home Layout: One level Home Equipment: Cane - single point;Shower seat - built in;Wheelchair - manual      Prior Function Level of Independence: Independent with assistive device(s)         Comments: household and short distanced community ambulator with [facility] or hand held assist     Hand Dominance   Dominant Hand: Right    Extremity/Trunk Assessment   Upper Extremity Assessment Upper Extremity Assessment: Generalized weakness    Lower Extremity Assessment Lower Extremity Assessment: Generalized weakness    Cervical / Trunk Assessment Cervical / Trunk Assessment: Normal  Communication   Communication: No difficulties  Cognition Arousal/Alertness: Awake/alert Behavior During Therapy: WFL for tasks assessed/performed Overall Cognitive Status: Within Functional Limits for tasks assessed                                        General Comments      Exercises     Assessment/Plan    PT Assessment Patient needs continued PT services  PT Problem List Decreased strength;Decreased activity tolerance;Decreased balance;Decreased mobility       PT Treatment Interventions Gait training;Therapeutic exercise;Stair training;Functional mobility training;Therapeutic activities;Patient/family education    PT Goals (Current goals can be found in the Care Plan section)  Acute Rehab PT Goals Patient Stated Goal: return home with family to assist PT Goal Formulation: With patient/family Time For Goal  Achievement: 08/28/18 Potential to Achieve Goals: Good    Frequency Min 3X/week   Barriers to discharge        Co-evaluation               AM-PAC PT "6 Clicks" Mobility  Outcome Measure Help needed turning from your back to your side while in a flat bed without using bedrails?: A Little Help needed moving from lying on your back to sitting on the side of a flat bed without using bedrails?: A Lot Help needed moving to and from a bed to a chair (including a wheelchair)?: A Little Help needed standing up from a chair using your arms (e.g., wheelchair or bedside chair)?: A Little Help needed to walk in hospital room?: A Little Help needed climbing 3-5 steps with a railing? : A Lot 6 Click Score: 16    End of Session Equipment Utilized During Treatment: Gait belt Activity Tolerance: Patient tolerated treatment well;Patient limited by fatigue Patient left: in chair;with call bell/phone within reach;with family/visitor present Nurse Communication: Mobility status PT Visit Diagnosis: Unsteadiness on feet (R26.81);Other abnormalities of gait and mobility (R26.89);Muscle weakness (generalized) (M62.81)    Time: 4818-5631 PT Time Calculation (min) (ACUTE ONLY): 33 min   Charges:  PT Treatments $Gait Training: 23-37 mins       3:01 PM, 08/21/18 Lonell Grandchild, MPT Physical Therapist with [facility] [phone] office [phone] mobile phone

## 2018-08-21 NOTE — Progress Notes (Signed)
ANTICOAGULATION CONSULT NOTE - follow up Sabrina Beck for Coumadin Indication: atrial fibrillation  No Known Allergies  Patient Measurements: Height: 5\' 4"  (162.6 cm) Weight: 213 lb 3.2 oz (96.7 kg) IBW/kg (Calculated) : 54.7  Vital Signs: Temp: 98 F (36.7 C) (01/15 1330) Temp Source: Oral (01/15 1330) BP: 128/69 (01/15 1330) Pulse Rate: 94 (01/15 1330)  Labs: Recent Labs    08/19/18 0522 08/20/18 0439 08/21/18 0411  HGB 8.9* 8.7* 8.0*  HCT 27.5* 27.8* 25.6*  PLT 248 262 280  LABPROT 26.0* 25.3* 31.0*  INR 2.42 2.34 3.04  CREATININE 6.34* 4.95* 7.34*    Estimated Creatinine Clearance: 7 mL/min (A) (by C-G formula based on SCr of 7.34 mg/dL (H)).   Medical History: Past Medical History:  Diagnosis Date  . Anemia 03/27/2011  . Atrial fibrillation ([place])   . Cancer ([place])    uterine  . Chest pain 05/15/2006   stress test perform negative for ischemia  . CHF (congestive heart failure) ([place])   . Complication of anesthesia   . Diverticulosis   . Dysrhythmia    afib  . Family history of anesthesia complication    "brother has nausea when put to sleep."  . GERD (gastroesophageal reflux disease)   . Headache   . Kidney disease    stage 4 chronic  . Mixed hyperlipidemia   . Murmur 06/04/2006,09/29/10   2D Echo perform both test shows EF 55%  . Peripheral neuropathy   . PONV (postoperative nausea and vomiting)   . Renal disorder   . Shortness of breath    with exertion  . Systemic hypertension     Medications:  See med rec  Assessment: 78 yo female presented to ED with shortness of breath. She is on chronic coumadin for afib and followed in anti-coag clinic.  INR today is 3.04 and showing upward trend, possibly due to interaction with azithromycin. Home dose is 10mg  every Monday and Thursday;  7.5mg  all other days.  Goal of Therapy:  INR 2-3 Monitor platelets by anticoagulation protocol: Yes   Plan:  Hold warfarin today PT-INR daily   Monitor for S/S of bleeding  Despina Beck, Pharm. D. Clinical Pharmacist 08/21/2018 3:11 PM

## 2018-08-21 NOTE — Progress Notes (Signed)
Admit: 08/18/2018 LOS: 3  45F ESRD MWF with SOB  Subjective:  . No overnight events, breathing stable, still requiring nasal cannula . Heart rate in 90s, 100s . Blood cultures are negative to date . Started on prednisone for component of COPD exacerbation . on metoprolol for rate control, not on diltiazem drip  01/14 0701 - 01/15 0700 In: 431.1 [I.V.:81.3; IV Piggyback:349.8] Out: -   Filed Weights   08/19/18 1315 08/20/18 0615 08/21/18 0400  Weight: 98 kg 98.8 kg 99.5 kg    Scheduled Meds: . budesonide (PULMICORT) nebulizer solution  0.25 mg Nebulization BID  . calcium acetate  1,334 mg Oral TID WC  . Chlorhexidine Gluconate Cloth  6 each Topical Q0600  . Chlorhexidine Gluconate Cloth  6 each Topical Q0600  . levalbuterol  0.63 mg Nebulization Q6H WA  . metoprolol tartrate  100 mg Oral BID  . multivitamin  1 tablet Oral QHS  . pantoprazole  40 mg Oral Daily  . predniSONE  20 mg Oral Q breakfast  . sevelamer carbonate  1,600 mg Oral TID WC  . sevelamer carbonate  800 mg Oral With snacks  . Warfarin - Pharmacist Dosing Inpatient   Does not apply q1800   Continuous Infusions: . sodium chloride    . sodium chloride    . azithromycin Stopped (08/20/18 1245)  . cefTRIAXone (ROCEPHIN)  IV Stopped (08/20/18 1135)  . diltiazem (CARDIZEM) infusion Stopped (08/20/18 1444)   PRN Meds:.sodium chloride, sodium chloride, acetaminophen, alteplase, guaiFENesin-dextromethorphan, heparin, lidocaine (PF), lidocaine-prilocaine, pentafluoroprop-tetrafluoroeth  Current Labs: reviewed    Physical Exam:  Blood pressure (!) 154/74, pulse 83, temperature 97.9 F (36.6 C), temperature source Oral, resp. rate 20, height 5\' 4"  (1.626 m), weight 99.5 kg, SpO2 98 %. WA/WD, NAD Normal rate, irregular, normal S1 and S2, no rub Diminished but not resolved diffuse musical scattered expiratory wheezing Soft, nontender, obese/protuberant No lower extremity edema Left forearm AV fistula, cannulated,  bruit and thrill present No rashes/skin lesions Nonfocal, alert and oriented x3, cranial nerves II through XII grossly intact NCAT EOMI  A 1. ESRD MWF [facility] FA AVF 2. SOB / CAP ? COPD; unsure how weights compare to outpt HD unit; s/p extra HD 08/18/18 limited by cramping; 3.9kg down from admission, good SpO2 on [place] 3. ANemia of CKD; some downtrending during admission, hemoglobin of 8 this morning; no indication for transfusion 4. CKD BMD on [place] 5. AFib with RVR, warfarin, oral metoprolol  P . Cont HD on schedule today: 4h, 2K, AVF 16g, Qb 350, Goal 2L UF, no heparin . Standing weights, will call outpatient HD center with our post weights so they can adjust her EDW . Follow hemoglobin for now, can resume management as an outpatient . Medication Issues; o Preferred narcotic agents for pain control are hydromorphone, fentanyl, and methadone. Morphine should not be used.  o Baclofen should be avoided o Avoid oral sodium phosphate and magnesium citrate based laxatives / bowel preps    Pearson Grippe MD 08/21/2018, 8:25 AM  Recent Labs  Lab 08/19/18 0522 08/20/18 0439 08/21/18 0411  NA 139 139 139  K 4.0 4.4 4.7  CL 100 102 100  CO2 28 28 27   GLUCOSE 121* 124* 110*  BUN 28* 25* 47*  CREATININE 6.34* 4.95* 7.34*  CALCIUM 9.6 9.4 10.1   Recent Labs  Lab 08/18/18 1056  08/19/18 0522 08/20/18 0439 08/21/18 0411  WBC 10.6*  --  9.6 9.3 10.1  NEUTROABS 8.1*  --   --   --   --  HGB 9.3*   < > 8.9* 8.7* 8.0*  HCT 29.7*   < > 27.5* 27.8* 25.6*  MCV 107.2*  --  107.4* 108.6* 109.4*  PLT 301  --  248 262 280   < > = values in this interval not displayed.

## 2018-08-22 ENCOUNTER — Inpatient Hospital Stay (HOSPITAL_COMMUNITY): Payer: Medicare HMO

## 2018-08-22 DIAGNOSIS — I361 Nonrheumatic tricuspid (valve) insufficiency: Secondary | ICD-10-CM

## 2018-08-22 DIAGNOSIS — I5021 Acute systolic (congestive) heart failure: Secondary | ICD-10-CM

## 2018-08-22 DIAGNOSIS — I4821 Permanent atrial fibrillation: Secondary | ICD-10-CM

## 2018-08-22 LAB — CULTURE, RESPIRATORY

## 2018-08-22 LAB — CULTURE, RESPIRATORY W GRAM STAIN

## 2018-08-22 LAB — ECHOCARDIOGRAM COMPLETE
Height: 64 in
Weight: 3418.01 oz

## 2018-08-22 LAB — PROTIME-INR
INR: 3
Prothrombin Time: 30.7 seconds — ABNORMAL HIGH (ref 11.4–15.2)

## 2018-08-22 MED ORDER — DILTIAZEM HCL 30 MG PO TABS
30.0000 mg | ORAL_TABLET | Freq: Four times a day (QID) | ORAL | Status: DC
  Administered 2018-08-22: 30 mg via ORAL
  Filled 2018-08-22: qty 1

## 2018-08-22 MED ORDER — METOPROLOL TARTRATE 50 MG PO TABS
150.0000 mg | ORAL_TABLET | Freq: Two times a day (BID) | ORAL | Status: DC
  Administered 2018-08-22 – 2018-08-23 (×2): 150 mg via ORAL
  Filled 2018-08-22 (×2): qty 3

## 2018-08-22 NOTE — Progress Notes (Signed)
*  PRELIMINARY RESULTS* Echocardiogram 2D Echocardiogram has been performed.  Leavy Cella 08/22/2018, 3:00 PM

## 2018-08-22 NOTE — Progress Notes (Signed)
  [facility] Progress Note   Assessment/ Plan:   1. CAP/ COPD: per primary, on prednisone, abx 2.ESRD MWF [facility] [place], next rx tomorrow- post weight yesterday 96.7, have notified HD unit 3. 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 Nutrition: renal diet 6. Hypertension: on metop 7. Afib: on metop and warfarin- being adjusted while on coumadin 8.  DIspo: possible d/c today  Subjective:    Feeling OK.  Going to get up and walk around after lunch.  Still on  O2.     Objective:   BP (!) 86/64   Pulse (!) 134   Temp 98.4 F (36.9 C) (Oral)   Resp 18   Ht 5\' 4"  (1.626 m)   Wt 96.9 kg   SpO2 94%   BMI 36.67 kg/m   Physical Exam: Gen: NAD, sitting in chair IWL:[id number], no murmurs Resp: LLL crackles, otherwise clear Abd: soft nontender Ext: no LE edema ACCESS: L FA AVF + T/B, aneurysmal areas with some improving redness and small area of eschar  Labs: BMET Recent Labs  Lab 08/18/18 1056 08/18/18 1106 08/19/18 0522 08/20/18 0439 08/21/18 0411  NA 137 136 139 139 139  K 4.4 4.5 4.0 4.4 4.7  CL 102 102 100 102 100  CO2 18*  --  28 28 27   GLUCOSE 242* 239* 121* 124* 110*  BUN 50* 41* 28* 25* 47*  CREATININE 9.57* 9.20* 6.34* 4.95* 7.34*  CALCIUM 10.1  --  9.6 9.4 10.1   CBC Recent Labs  Lab 08/18/18 1056 08/18/18 1106 08/19/18 0522 08/20/18 0439 08/21/18 0411  WBC 10.6*  --  9.6 9.3 10.1  NEUTROABS 8.1*  --   --   --   --   HGB 9.3* 9.5* 8.9* 8.7* 8.0*  HCT 29.7* 28.0* 27.5* 27.8* 25.6*  MCV 107.2*  --  107.4* 108.6* 109.4*  PLT 301  --  248 262 280    @IMGRELPRIORS @ Medications:    . azithromycin  500 mg Oral Daily  . budesonide (PULMICORT) nebulizer solution  0.5 mg Nebulization BID  . calcium acetate  1,334 mg Oral TID WC  . Chlorhexidine Gluconate Cloth  6 each Topical Q0600  . diltiazem  30 mg Oral Q6H  . doxercalciferol  8 mcg Intravenous Q M,W,F-HD  . epoetin (EPOGEN/PROCRIT) injection  2,600 Units  Intravenous Q M,W,F-HD  . ipratropium  0.5 mg Nebulization Q6H  . levalbuterol  0.63 mg Nebulization Q6H WA  . metoprolol tartrate  100 mg Oral BID  . multivitamin  1 tablet Oral QHS  . pantoprazole  40 mg Oral Daily  . predniSONE  40 mg Oral Q breakfast  . sevelamer carbonate  1,600 mg Oral TID WC  . sevelamer carbonate  800 mg Oral With snacks  . Warfarin - Pharmacist Dosing Inpatient   Does not apply q1800     Madelon Lips, MD 08/22/2018, 12:31 PM

## 2018-08-22 NOTE — Progress Notes (Signed)
PROGRESS NOTE  Sabrina Beck HUD:[id number] DOB: 16-Oct-1940 DOA: 08/18/2018 PCP: Redmond School, MD  Brief History:   78 y.o.femalewith medical history significant ofend-stage renal disease, permanent atrial fibrillation on chronic anticoagulation with Coumadin, chronic diastolic CHF, hypertension, GERD, presents to the hospital with chief complaint of significant shortness of breath. She has been having upper respiratory type symptoms for the past 2 to 3 weeks, she was diagnosed with lobar pneumonia 3 days prior to admission as an outpatient and started on the Levaquin. She felt worse and came to the ED, and was also found to be in pulmonary edemaas well as in A. fib with RVR.  78 y.o.femalewith medical history significant ofend-stage renal disease, permanent atrial fibrillation on chronic anticoagulation with Coumadin, chronic diastolic CHF, hypertension, GERD, presents to the hospital with chief complaint of significant shortness of breath. She has been having upper respiratory type symptoms for the past 2 to 3 weeks, she was diagnosed with lobar pneumonia 3 days prior to admission as an outpatient and started on the Levaquin. She felt worse and came to the ED, and was also found to be in pulmonary edemaas well as in A. fib with RVR.  She was initially started on a diltiazem drip.  her metoprolol tartrate was gradually titrated up, and her diltiazem drip was subsequently weaned off.  Assessment/Plan: Acute hypoxic respiratory failure with hypoxia -multifactorial due tolobar pneumoniaas well as  pulmonary edema -Patient was admitted to stepdown and placed on supplemental oxygen. Nephrology was consulted and underwent emergent dialysis on 1/12 evening, as well as 11/13 -Wheezing has improved but stillthere, she is breathing more comfortable -Continue nebulizers, continue antibiotics, cultures are negative -Increase pulmicort to 0.5 mg bid -pt endorsed some indiscretion  with fluids -1/16--Echo--EF 45%, mid inferior septal HK, PASP 21, mild TR/MR -Ambulatory pulse ox showed desaturation <88%--> patient will have to go home on supplemental oxygen  Acute systolic CHF -With decrease of the patient's EF, there is likely a component of decompensated CHF -Given the decrease in EF, discontinue diltiazem -Increase metoprolol tartrate to 150 mg twice daily  Bronchospasm -Continue Pulmicort to 0.5 mg bid -Continue prednisone to 40 mg daily -added atrovent neb  Atrial fibrillation, permanent, with RVR -This is likely in the setting of fluid overload,  -she was placed on Cardizem infusion, continue along with a home metoprolol,  -rate worse due to acute hypoxia and fluid overload.  -Continue Coumadin per pharmacy -Discontinue diltiazem as the patient's EF has decreased -metoprolol increased to 150 mg bid  Hypertension -Blood pressure remained stable  Anemia in the setting of end-stage renal disease -Hemoglobin stable, no bleeding  GERD -Continue home PPI    Disposition Plan:   Home 1/17 if stable Family Communication:   Son updated at bedside 1/15  Consultants:  renal  Code Status:  FULL   DVT Prophylaxis:  Coumadin   Procedures: As Listed in Progress Note Above  Antibiotics: Ceftriaxone 1/12>>> Azithromycin 1/12>>     Subjective: Overall, patient states that her breathing is better.  She has some dyspnea with exertion.  She denies any chest pain, nausea, vomiting, diarrhea, abdominal pain, headache, neck pain.  Objective: Vitals:   08/22/18 1300 08/22/18 1340 08/22/18 1400 08/22/18 1457  BP:   92/66   Pulse: (!) 103     Resp: 20 20 (!) 22   Temp:      TempSrc:      SpO2: 98%   92%  Weight:      Height:        Intake/Output Summary (Last 24 hours) at 08/22/2018 1614 Last data filed at 08/22/2018 1550 Gross per 24 hour  Intake 100 ml  Output -  Net 100 ml   Weight change: 0 kg Exam:   General:  Pt is  alert, follows commands appropriately, not in acute distress  HEENT: No icterus, No thrush, No neck mass, [place]/AT  Cardiovascular: IRRR, S1/S2, no rubs, no gallops  Respiratory: Bilateral rales, recruitment.  No wheezing  Abdomen: Soft/+BS, non tender, non distended, no guarding  Extremities: No edema, No lymphangitis, No petechiae, No rashes, no synovitis   Data Reviewed: I have personally reviewed following labs and imaging studies Basic Metabolic Panel: Recent Labs  Lab 08/18/18 1056 08/18/18 1106 08/19/18 0522 08/20/18 0439 08/21/18 0411  NA 137 136 139 139 139  K 4.4 4.5 4.0 4.4 4.7  CL 102 102 100 102 100  CO2 18*  --  28 28 27   GLUCOSE 242* 239* 121* 124* 110*  BUN 50* 41* 28* 25* 47*  CREATININE 9.57* 9.20* 6.34* 4.95* 7.34*  CALCIUM 10.1  --  9.6 9.4 10.1   Liver Function Tests: Recent Labs  Lab 08/18/18 1056 08/19/18 0522  AST 33 24  ALT 36 31  ALKPHOS 65 57  BILITOT 1.2 1.0  PROT 6.8 6.4*  ALBUMIN 3.2* 2.9*   No results for input(s): LIPASE, AMYLASE in the last 168 hours. No results for input(s): AMMONIA in the last 168 hours. Coagulation Profile: Recent Labs  Lab 08/18/18 1125 08/19/18 0522 08/20/18 0439 08/21/18 0411 08/22/18 0430  INR 2.32 2.42 2.34 3.04 3.00   CBC: Recent Labs  Lab 08/18/18 1056 08/18/18 1106 08/19/18 0522 08/20/18 0439 08/21/18 0411  WBC 10.6*  --  9.6 9.3 10.1  NEUTROABS 8.1*  --   --   --   --   HGB 9.3* 9.5* 8.9* 8.7* 8.0*  HCT 29.7* 28.0* 27.5* 27.8* 25.6*  MCV 107.2*  --  107.4* 108.6* 109.4*  PLT 301  --  248 262 280   Cardiac Enzymes: No results for input(s): CKTOTAL, CKMB, CKMBINDEX, TROPONINI in the last 168 hours. BNP: Invalid input(s): POCBNP CBG: No results for input(s): GLUCAP in the last 168 hours. HbA1C: No results for input(s): HGBA1C in the last 72 hours. Urine analysis:    Component Value Date/Time   COLORURINE YELLOW 03/03/2012 1500   APPEARANCEUR CLEAR 03/03/2012 1500   LABSPEC 1.020  03/03/2012 1500   PHURINE 6.0 03/03/2012 1500   GLUCOSEU 100 (A) 03/03/2012 1500   HGBUR TRACE (A) 03/03/2012 1500   BILIRUBINUR NEGATIVE 03/03/2012 1500   KETONESUR NEGATIVE 03/03/2012 1500   PROTEINUR 100 (A) 03/03/2012 1500   UROBILINOGEN 0.2 03/03/2012 1500   NITRITE NEGATIVE 03/03/2012 1500   LEUKOCYTESUR NEGATIVE 03/03/2012 1500   Sepsis Labs: @LABRCNTIP (procalcitonin:4,lacticidven:4) ) Recent Results (from the past 240 hour(s))  Blood culture (routine x 2)     Status: None (Preliminary result)   Collection Time: 08/18/18 11:12 AM  Result Value Ref Range Status   Specimen Description BLOOD RIGHT HAND  Final   Special Requests   Final    BOTTLES DRAWN AEROBIC AND ANAEROBIC Blood Culture adequate volume   Culture   Final    NO GROWTH 4 DAYS Performed at [facility], [address], [place], [place] [address]    Report Status PENDING  Incomplete  Blood culture (routine x 2)     Status: None (Preliminary result)  Collection Time: 08/18/18  1:50 PM  Result Value Ref Range Status   Specimen Description BLOOD RIGHT WRIST  Final   Special Requests   Final    BOTTLES DRAWN AEROBIC AND ANAEROBIC Blood Culture adequate volume   Culture   Final    NO GROWTH 4 DAYS Performed at [facility], [address] [place], [place] [address]    Report Status PENDING  Incomplete  Culture, sputum-assessment     Status: None   Collection Time: 08/18/18  2:15 PM  Result Value Ref Range Status   Specimen Description EXPECTORATED SPUTUM  Final   Special Requests NONE  Final   Sputum evaluation   Final    THIS SPECIMEN IS ACCEPTABLE FOR SPUTUM CULTURE Performed at [facility], [address], [place], [place] [address]    Report Status 08/19/2018 FINAL  Final  MRSA PCR Screening     Status: None   Collection Time: 08/18/18  2:15 PM  Result Value Ref Range Status   MRSA by PCR NEGATIVE NEGATIVE Final    Comment:        The GeneXpert MRSA Assay (FDA approved for NASAL specimens only),  is one component of a comprehensive MRSA colonization surveillance program. It is not intended to diagnose MRSA infection nor to guide or monitor treatment for MRSA infections. Performed at [facility], [address] [place], [place] [address]   Culture, respiratory     Status: None   Collection Time: 08/18/18  2:15 PM  Result Value Ref Range Status   Specimen Description   Final    EXPECTORATED SPUTUM Performed at [facility], [address], [place], [place] [address]    Special Requests   Final    NONE Reflexed from [id number] Performed at [place] [place], [place] [address]    Gram Stain   Final    RARE WBC PRESENT, PREDOMINANTLY PMN MODERATE BUDDING YEAST SEEN MODERATE GRAM POSITIVE RODS RARE GRAM POSITIVE COCCI IN PAIRS Performed at [facility], [place] [address], [place], [place] [address]    Culture MODERATE CANDIDA DUBLINIENSIS  Final   Report Status 08/22/2018 FINAL  Final     Scheduled Meds: . azithromycin  500 mg Oral Daily  . budesonide (PULMICORT) nebulizer solution  0.5 mg Nebulization BID  . calcium acetate  1,334 mg Oral TID WC  . Chlorhexidine Gluconate Cloth  6 each Topical Q0600  . doxercalciferol  8 mcg Intravenous Q M,W,F-HD  . epoetin (EPOGEN/PROCRIT) injection  2,600 Units Intravenous Q M,W,F-HD  . ipratropium  0.5 mg Nebulization Q6H  . levalbuterol  0.63 mg Nebulization Q6H WA  . metoprolol tartrate  150 mg Oral BID  . multivitamin  1 tablet Oral QHS  . pantoprazole  40 mg Oral Daily  . predniSONE  40 mg Oral Q breakfast  . sevelamer carbonate  1,600 mg Oral TID WC  . sevelamer carbonate  800 mg Oral With snacks  . Warfarin - Pharmacist Dosing Inpatient   Does not apply q1800   Continuous Infusions: . cefTRIAXone (ROCEPHIN)  IV Stopped (08/22/18 1216)    Procedures/Studies: Dg Chest 2 View  Result Date: 08/16/2018 CLINICAL DATA:  Cough with yellow phlegm, shortness of breath, and chest discomfort for a few weeks.  EXAM: CHEST - 2 VIEW COMPARISON:  02/13/2018 FINDINGS: Patchy left perihilar airspace opacities of developed. Cardiomegaly. Low volumes. No pneumothorax. No pleural effusion. Right lung is relatively clear. IMPRESSION: Left perihilar airspace disease is worrisome for bronchopneumonia.  Followup PA and lateral chest X-ray is recommended in 3-4 weeks following trial of antibiotic therapy to ensure resolution and exclude underlying malignancy. Electronically Signed   By: Marybelle Killings M.D.   On: 08/16/2018 08:34   Dg Chest Portable 1 View  Result Date: 08/18/2018 CLINICAL DATA:  Shortness of breath.  History of pneumonia. EXAM: PORTABLE CHEST 1 VIEW COMPARISON:  Chest radiograph 08/15/2018 FINDINGS: Motion artifact limits exam. Monitoring leads overlie the patient. Stable cardiomegaly. Aortic atherosclerosis. Interval increase in consolidative opacities left upper lung. Interval development of diffuse bilateral interstitial pulmonary opacities. IMPRESSION: Increasing consolidation within the left upper lung which may represent pneumonia. Followup PA and lateral chest X-ray is recommended in 3-4 weeks following trial of antibiotic therapy to ensure resolution and exclude underlying malignancy. Interval development of diffuse bilateral interstitial opacities which may represent edema. Electronically Signed   By: Lovey Newcomer M.D.   On: 08/18/2018 11:47    Orson Eva, DO  [facility] Pager [phone]  If 7PM-7AM, please contact night-coverage [email or web address] Password [email or web address] 08/22/2018, 4:14 PM   LOS: 4 days

## 2018-08-22 NOTE — Progress Notes (Signed)
Pt able to ambulate down the hall and back to room using a front wheel walker. Saturations dropped to 86% while ambulating. HR also increased to 120's while ambulating. Once pt was back in room and sitting in recliner, HR came back down to 90's and saturations came back up to 93%.

## 2018-08-22 NOTE — Progress Notes (Signed)
ANTICOAGULATION CONSULT NOTE - follow up [place] for Coumadin Indication: atrial fibrillation  No Known Allergies  Patient Measurements: Height: 5\' 4"  (162.6 cm) Weight: 213 lb 10 oz (96.9 kg) IBW/kg (Calculated) : 54.7  Vital Signs: Temp: 98.4 F (36.9 C) (01/16 0758) Temp Source: Oral (01/16 0758) BP: 150/72 (01/16 0600) Pulse Rate: 95 (01/16 0600)  Labs: Recent Labs    08/20/18 0439 08/21/18 0411 08/22/18 0430  HGB 8.7* 8.0*  --   HCT 27.8* 25.6*  --   PLT 262 280  --   LABPROT 25.3* 31.0* 30.7*  INR 2.34 3.04 3.00  CREATININE 4.95* 7.34*  --     Estimated Creatinine Clearance: 7 mL/min (A) (by C-G formula based on SCr of 7.34 mg/dL (H)).   Medical History: Past Medical History:  Diagnosis Date  . Anemia 03/27/2011  . Atrial fibrillation ([place])   . Cancer ([place])    uterine  . Chest pain 05/15/2006   stress test perform negative for ischemia  . CHF (congestive heart failure) ([place])   . Complication of anesthesia   . Diverticulosis   . Dysrhythmia    afib  . Family history of anesthesia complication    "brother has nausea when put to sleep."  . GERD (gastroesophageal reflux disease)   . Headache   . Kidney disease    stage 4 chronic  . Mixed hyperlipidemia   . Murmur 06/04/2006,09/29/10   2D Echo perform both test shows EF 55%  . Peripheral neuropathy   . PONV (postoperative nausea and vomiting)   . Renal disorder   . Shortness of breath    with exertion  . Systemic hypertension     Medications:  See med rec  Assessment: 78 yo female presented to ED with shortness of breath. She is on chronic coumadin for afib and followed in anti-coag clinic.  INR today is 3.0, possibly due to interaction with azithromycin. Home dose is 10mg  every Monday and Thursday;  7.5mg  all other days.  Goal of Therapy:  INR 2-3 Monitor platelets by anticoagulation protocol: Yes   Plan:  Hold warfarin today for INR 3.0 PT-INR daily  Monitor for S/S  of bleeding  [name], Pharm. D. Clinical Pharmacist 08/22/2018 9:22 AM

## 2018-08-22 NOTE — Care Management Note (Signed)
Case Management Note  Patient Details  Name: Sabrina Beck MRN: [id number] Date of Birth: 11/17/1940  Subjective/Objective:           Admitted with resp failure from home.   Lives with son. ESRD on HD MWF. On oxygen acutely. Has cane to use pta. Recommended for [place] PT and RW. Pt declines referral for [facility] or OP PT. Will call her PCP if she gets home and needs it. Would like RW and has chosen [facility] from [employer] list.     Action/Plan: Vaughan Basta, [facility] rep, given DME referral and will deliver RW to pt room prior to DC.   Expected Discharge Date:     08/23/2018             Expected Discharge Plan:  Home/Self Care  In-House Referral:  NA  Discharge planning Services  CM Consult  Post Acute Care Choice:  Durable Medical Equipment Choice offered to:  Patient  DME Arranged:  Gilford Rile rolling DME Agency:  [place]:  Patient Refused [place] Agency:     Status of Service:  Completed, signed off  Sherald Barge, RN 08/22/2018, 1:17 PM

## 2018-08-22 NOTE — Progress Notes (Signed)
SATURATION QUALIFICATIONS: (This note is used to comply with regulatory documentation for home oxygen)  Patient Saturations on Room Air at Rest = 94%  Patient Saturations on Room Air while Ambulating = 86%  Patient Saturations on 2 Liters of oxygen while Ambulating = 94%  Please briefly explain why patient needs home oxygen: pt desaturates while ambulating without oxygen and does state she feels SOB when ambulating without oxygen

## 2018-08-23 ENCOUNTER — Encounter (HOSPITAL_COMMUNITY): Payer: Self-pay | Admitting: Cardiology

## 2018-08-23 DIAGNOSIS — I429 Cardiomyopathy, unspecified: Secondary | ICD-10-CM

## 2018-08-23 LAB — CULTURE, BLOOD (ROUTINE X 2)
Culture: NO GROWTH
Culture: NO GROWTH
Special Requests: ADEQUATE
Special Requests: ADEQUATE

## 2018-08-23 LAB — RENAL FUNCTION PANEL
ALBUMIN: 3 g/dL — AB (ref 3.5–5.0)
Anion gap: 16 — ABNORMAL HIGH (ref 5–15)
BUN: 69 mg/dL — ABNORMAL HIGH (ref 8–23)
CO2: 25 mmol/L (ref 22–32)
Calcium: 10.4 mg/dL — ABNORMAL HIGH (ref 8.9–10.3)
Chloride: 96 mmol/L — ABNORMAL LOW (ref 98–111)
Creatinine, Ser: 7.82 mg/dL — ABNORMAL HIGH (ref 0.44–1.00)
GFR calc Af Amer: 5 mL/min — ABNORMAL LOW (ref >60)
GFR calc non Af Amer: 4 mL/min — ABNORMAL LOW (ref >60)
Glucose, Bld: 98 mg/dL (ref 70–99)
Phosphorus: 5.8 mg/dL — ABNORMAL HIGH (ref 2.5–4.6)
Potassium: 4 mmol/L (ref 3.5–5.1)
Sodium: 137 mmol/L (ref 135–145)

## 2018-08-23 LAB — CBC
HCT: 27.5 % — ABNORMAL LOW (ref 36.0–46.0)
Hemoglobin: 8.8 g/dL — ABNORMAL LOW (ref 12.0–15.0)
MCH: 34.2 pg — ABNORMAL HIGH (ref 26.0–34.0)
MCHC: 32 g/dL (ref 30.0–36.0)
MCV: 107 fL — ABNORMAL HIGH (ref 80.0–100.0)
NRBC: 0 % (ref 0.0–0.2)
PLATELETS: 332 10*3/uL (ref 150–400)
RBC: 2.57 MIL/uL — ABNORMAL LOW (ref 3.87–5.11)
RDW: 12.9 % (ref 11.5–15.5)
WBC: 11.6 10*3/uL — ABNORMAL HIGH (ref 4.0–10.5)

## 2018-08-23 LAB — PROTIME-INR
INR: 2.43
Prothrombin Time: 26.1 seconds — ABNORMAL HIGH (ref 11.4–15.2)

## 2018-08-23 MED ORDER — METOPROLOL TARTRATE 50 MG PO TABS
200.0000 mg | ORAL_TABLET | Freq: Two times a day (BID) | ORAL | Status: DC
  Administered 2018-08-23 – 2018-08-24 (×2): 200 mg via ORAL
  Filled 2018-08-23 (×2): qty 4

## 2018-08-23 MED ORDER — WARFARIN SODIUM 5 MG PO TABS
5.0000 mg | ORAL_TABLET | Freq: Once | ORAL | Status: AC
  Administered 2018-08-23: 5 mg via ORAL
  Filled 2018-08-23: qty 1

## 2018-08-23 MED ORDER — DOXERCALCIFEROL 4 MCG/2ML IV SOLN: 6.0000 ug | INTRAVENOUS | Status: DC

## 2018-08-23 MED ORDER — LANTHANUM CARBONATE 500 MG PO CHEW
1000.0000 mg | CHEWABLE_TABLET | Freq: Three times a day (TID) | ORAL | Status: DC
  Administered 2018-08-23 – 2018-08-24 (×3): 1000 mg via ORAL
  Filled 2018-08-23 (×9): qty 2

## 2018-08-23 NOTE — Procedures (Signed)
    HEMODIALYSIS TREATMENT NOTE (Fri 17 Jan):  Uneventful 4 hour heparin-free dialysis completed.  AVF with stable VP/AP but max Qb 325 due to proximal position of venous needle.  Goal met: 2.3 liters removed without interruption in ultrafiltration.  All blood was returned and hemostasis was achieved in 15 minutes.  Rockwell Alexandria, RN

## 2018-08-23 NOTE — Consult Note (Signed)
Cardiology Consultation:   Patient ID: Sabrina Beck; [id number]; [birth year]   Admit date: 08/18/2018 Date of Consult: 08/23/2018  Primary Care Provider: Redmond School, MD Primary Cardiologist: Dr. Sanda Klein   Patient Profile:   Sabrina Beck is a 78 y.o. female with a history of permanent atrial fibrillation, chronic diastolic heart failure, hypertension, and ESRD on hemodialysis who is being seen today for the evaluation of cardiomyopathy and atrial fibrillation at the request of Dr. Carles Collet.  History of Present Illness:   Sabrina Beck admitted to the hospital with acute hypoxic respiratory failure in the setting of pneumonia with associated component of pulmonary edema.  She is on broad-spectrum antibiotics and nebulizer treatments, slowly improving and afebrile at this point.  Follow-up echocardiogram was obtained yesterday which revealed moderate LVH with LVEF approximately 45%, mid inferoseptal hypokinesis, indeterminate diastolic function, mild mitral regurgitation with moderately dilated left atrium, PASP 21 mmHg.  Previous echocardiogram from 2015 indicated LVEF of 50 to 55% with mid inferoseptal and apical hypokinesis.  She initially had elevated heart rate in permanent atrial fibrillation, was on intravenous diltiazem which was then switched to oral regimen.  At this point she is on metoprolol alone.  Heart rates are in the 90-100 range.  She is on Coumadin with therapeutic INR of 2.43.  She is due for a regular hemodialysis session today.  She makes very little urine and is not on a diuretic as an outpatient.  Past Medical History:  Diagnosis Date  . Anemia   . Atrial fibrillation ([place])   . Chest pain 05/15/2006   Stress test negative for ischemia  . Diastolic heart failure ([place])   . Diverticulosis   . ESRD on hemodialysis ([place])   . GERD (gastroesophageal reflux disease)   . Headache   . Mixed hyperlipidemia   . Peripheral neuropathy   . PONV (postoperative nausea  and vomiting)   . Systemic hypertension   . Uterine cancer [facility])     Past Surgical History:  Procedure Laterality Date  . ABDOMINAL HYSTERECTOMY    . APPENDECTOMY    . AV FISTULA PLACEMENT Left 11/17/2013   Procedure: CREATION OF LEFT RADIOCEPHALIC ARTERIOVENOUS (AV) FISTULA ;  Surgeon: [name], MD;  Location: [place];  Service: Vascular;  Laterality: Left;  . BREAST SURGERY Left    tumors non canerous 2- 3 different surgeries  . CATARACT EXTRACTION W/PHACO Right 10/26/2016   Procedure: CATARACT EXTRACTION PHACO AND INTRAOCULAR LENS PLACEMENT (IOC) CDE= 11.91;  Surgeon: Tonny Branch, MD;  Location: [facility];  Service: Ophthalmology;  Laterality: Right;  right - pt knows to arrive at 6:30  . CATARACT EXTRACTION W/PHACO Left 11/30/2016   Procedure: CATARACT EXTRACTION PHACO AND INTRAOCULAR LENS PLACEMENT (IOC) CDE - 7.92 ;  Surgeon: Tonny Branch, MD;  Location: [facility];  Service: Ophthalmology;  Laterality: Left;  left  . CHOLECYSTECTOMY    . COLONOSCOPY W/ BIOPSIES AND POLYPECTOMY    . FISTULA SUPERFICIALIZATION Left 05/19/2014   Procedure: FISTULA SUPERFICIALIZATION-LEFT ARM;  Surgeon: [name], MD;  Location: [place];  Service: Vascular;  Laterality: Left;  . INSERTION OF DIALYSIS CATHETER N/A 01/24/2014   Procedure: INSERTION OF DIALYSIS CATHETER;  Surgeon: Rosetta Posner, MD;  Location: [facility] OR;  Service: Vascular;  Laterality: N/A;  . KNEE ARTHROSCOPY    . TUBAL LIGATION       Inpatient Medications: Scheduled Meds: . azithromycin  500 mg Oral Daily  . budesonide (PULMICORT) nebulizer solution  0.5 mg Nebulization  BID  . calcium acetate  1,334 mg Oral TID WC  . Chlorhexidine Gluconate Cloth  6 each Topical Q0600  . doxercalciferol  8 mcg Intravenous Q M,W,F-HD  . epoetin (EPOGEN/PROCRIT) injection  2,600 Units Intravenous Q M,W,F-HD  . ipratropium  0.5 mg Nebulization Q6H  . levalbuterol  0.63 mg Nebulization Q6H WA  . metoprolol tartrate  150 mg Oral BID  . multivitamin  1  tablet Oral QHS  . pantoprazole  40 mg Oral Daily  . predniSONE  40 mg Oral Q breakfast  . sevelamer carbonate  1,600 mg Oral TID WC  . sevelamer carbonate  800 mg Oral With snacks  . Warfarin - Pharmacist Dosing Inpatient   Does not apply q1800   Continuous Infusions: . cefTRIAXone (ROCEPHIN)  IV Stopped (08/22/18 1216)   PRN Meds: acetaminophen, alteplase, guaiFENesin-dextromethorphan, heparin, lidocaine (PF), lidocaine-prilocaine, pentafluoroprop-tetrafluoroeth  Allergies:   No Known Allergies  Social History:   Social History   Socioeconomic History  . Marital status: Widowed    Spouse name: Not on file  . Number of children: Not on file  . Years of education: Not on file  . Highest education level: Not on file  Occupational History  . Not on file  Social Needs  . Financial resource strain: Not on file  . Food insecurity:    Worry: Not on file    Inability: Not on file  . Transportation needs:    Medical: Not on file    Non-medical: Not on file  Tobacco Use  . Smoking status: Former Smoker    Last attempt to quit: 08/08/1999    Years since quitting: 19.0  . Smokeless tobacco: Never Used  Substance and Sexual Activity  . Alcohol use: No    Alcohol/week: 0.0 standard drinks  . Drug use: No  . Sexual activity: Not Currently  Lifestyle  . Physical activity:    Days per week: Not on file    Minutes per session: Not on file  . Stress: Not on file  Relationships  . Social connections:    Talks on phone: Not on file    Gets together: Not on file    Attends religious service: Not on file    Active member of club or organization: Not on file    Attends meetings of clubs or organizations: Not on file    Relationship status: Not on file  . Intimate partner violence:    Fear of current or ex partner: Not on file    Emotionally abused: Not on file    Physically abused: Not on file    Forced sexual activity: Not on file  Other Topics Concern  . Not on file  Social  History Narrative   Lives in a one story home.  One of her sons lives with her.  Has 2 sons.  Retired Oncologist.  Eduction: high school.    Family History:   The patient's family history includes Cancer in her brother; Diabetes in her brother, father, mother, and sister; Heart attack in her brother and father; Heart disease in her brother and father; Hyperlipidemia in her sister; Hypertension in her brother, mother, and sister.  ROS:  Please see the history of present illness.  Intermittent confusion in the morning, mild lower leg edema.  All other ROS reviewed and negative.     Physical Exam/Data:   Vitals:   08/23/18 0430 08/23/18 0500 08/23/18 0600 08/23/18 0700  BP:  (!) 147/82 (!) 146/88 137/83  Pulse:  88 90 97  Resp:  13 20 18   Temp:      TempSrc:      SpO2:  90% 93% 95%  Weight: 97.7 kg     Height:        Intake/Output Summary (Last 24 hours) at 08/23/2018 0850 Last data filed at 08/22/2018 1550 Gross per 24 hour  Intake 100 ml  Output -  Net 100 ml   Filed Weights   08/21/18 1419 08/22/18 0500 08/23/18 0430  Weight: 96.7 kg 96.9 kg 97.7 kg   Body mass index is 36.97 kg/m.   Gen: Overweight woman, appears comfortable at rest. HEENT: Conjunctiva and lids normal, oropharynx clear. Neck: Supple, no elevated JVP or carotid bruits, no thyromegaly. Lungs: Coarse breath sounds without wheezing, nonlabored breathing at rest. Cardiac: Irregularly irregular, soft systolic murmur, no pericardial rub. Abdomen: Soft, nontender, bowel sounds present, no guarding or rebound. Extremities: 1-2+ lower edema, distal pulses 2+. Skin: Warm and dry. Musculoskeletal: No kyphosis. Neuropsychiatric: Alert and oriented x3, affect grossly appropriate.  EKG:  I personally reviewed the tracing from 08/18/2018 which showed rapid atrial fibrillation with IVCD.  Telemetry:  I personally reviewed telemetry which shows trill fibrillation.  Relevant CV Studies:  Echocardiogram  08/22/2018: Study Conclusions  - Left ventricle: The cavity size was normal. Wall thickness was   increased in a pattern of moderate LVH. The estimated ejection   fraction was 45%. There is hypokinesis of the midinferoseptal   myocardium. The study was not technically sufficient to allow   evaluation of LV diastolic dysfunction due to atrial   fibrillation. - Aortic valve: Mildly calcified annulus. Trileaflet; mildly   calcified leaflets. - Mitral valve: Mildly calcified annulus. There was mild   regurgitation. - Left atrium: The atrium was moderately dilated. - Right atrium: The atrium was mildly to moderately dilated. - Atrial septum: No defect or patent foramen ovale was identified. - Tricuspid valve: There was mild regurgitation. - Pulmonary arteries: PA peak pressure: 21 mm Hg (S). - Pericardium, extracardiac: There was no pericardial effusion.  Laboratory Data:  Chemistry Recent Labs  Lab 08/20/18 0439 08/21/18 0411 08/23/18 0507  NA 139 139 137  K 4.4 4.7 4.0  CL 102 100 96*  CO2 28 27 25   GLUCOSE 124* 110* 98  BUN 25* 47* 69*  CREATININE 4.95* 7.34* 7.82*  CALCIUM 9.4 10.1 10.4*  GFRNONAA 8* 5* 4*  GFRAA 9* 6* 5*  ANIONGAP 9 12 16*    Recent Labs  Lab 08/18/18 1056 08/19/18 0522 08/23/18 0507  PROT 6.8 6.4*  --   ALBUMIN 3.2* 2.9* 3.0*  AST 33 24  --   ALT 36 31  --   ALKPHOS 65 57  --   BILITOT 1.2 1.0  --    Hematology Recent Labs  Lab 08/19/18 0522 08/20/18 0439 08/21/18 0411  WBC 9.6 9.3 10.1  RBC 2.56* 2.56* 2.34*  HGB 8.9* 8.7* 8.0*  HCT 27.5* 27.8* 25.6*  MCV 107.4* 108.6* 109.4*  MCH 34.8* 34.0 34.2*  MCHC 32.4 31.3 31.3  RDW 13.1 13.2 12.9  PLT 248 262 280   Cardiac EnzymesNo results for input(s): TROPONINI in the last 168 hours.  Recent Labs  Lab 08/18/18 1117  TROPIPOC 0.02   Radiology/Studies:   Chest x-ray 08/18/2018: FINDINGS: Motion artifact limits exam. Monitoring leads overlie the patient. Stable cardiomegaly.  Aortic atherosclerosis. Interval increase in consolidative opacities left upper lung. Interval development of diffuse bilateral interstitial pulmonary opacities.  IMPRESSION: Increasing  consolidation within the left upper lung which may represent pneumonia. Followup PA and lateral chest X-ray is recommended in 3-4 weeks following trial of antibiotic therapy to ensure resolution and exclude underlying malignancy.  Interval development of diffuse bilateral interstitial opacities which may represent edema.  Assessment and Plan:   1.  Secondary cardiomyopathy, LVEF 45% with mid inferoseptal hypokinesis.  Previous LVEF 50 to 55% as of 2015 although wall motion abnormalities were noted at that time as well.  She presents with acute hypoxic respiratory failure associated with pneumonia, also rapid ventricular response in permanent atrial fibrillation which could be contributing to reduction in LVEF.  Troponin I level was negative.  She does not report any angina symptoms.  2.  Permanent atrial fibrillation, on metoprolol and Coumadin.  INR currently therapeutic.  3.  ESRD on hemodialysis.  She reports compliance with sessions.  Makes very little urine and is not on a diuretic, volume status is managed by hemodialysis.  4.  Acute hypoxic respiratory failure in the setting of lobar pneumonia with associated component of pulmonary edema.  She is on broad-spectrum antibiotics, steroids, and nebulizer treatments.  Currently afebrile.  5.  Essential hypertension.  Agree with uptitration of Lopressor for heart rate control in atrial fibrillation, this may fluctuate as she improves from other comorbid illnesses.  Continue Coumadin for stroke prophylaxis.  She is due for a hemodialysis session today, this will further assist with overall volume status.  Would not pursue follow-up ischemic testing at this time.  Work toward recovery from her acute presentation, focus on heart rate control of atrial  fibrillation, and can then consider follow-up assessment of LVEF down the road.  Depending on blood pressure, might consider initiating low-dose ARB with cardiomyopathy, should not be a problem with ESRD on hemodialysis unless this affects her blood pressure during sessions.  Signed, Rozann Lesches, MD  08/23/2018 8:50 AM

## 2018-08-23 NOTE — Progress Notes (Signed)
ANTICOAGULATION CONSULT NOTE - follow up [place] for Coumadin Indication: atrial fibrillation  No Known Allergies  Patient Measurements: Height: 5\' 4"  (162.6 cm) Weight: 215 lb 6.2 oz (97.7 kg) IBW/kg (Calculated) : 54.7  Vital Signs: Temp: 97.9 F (36.6 C) (01/17 1021) Temp Source: Oral (01/17 1021) BP: 141/85 (01/17 1100) Pulse Rate: 90 (01/17 1100)  Labs: Recent Labs    08/21/18 0411 08/22/18 0430 08/23/18 0507 08/23/18 1021  HGB 8.0*  --   --  8.8*  HCT 25.6*  --   --  27.5*  PLT 280  --   --  332  LABPROT 31.0* 30.7* 26.1*  --   INR 3.04 3.00 2.43  --   CREATININE 7.34*  --  7.82*  --     Estimated Creatinine Clearance: 6.6 mL/min (A) (by C-G formula based on SCr of 7.82 mg/dL (H)).  Assessment: 78 yo female presented to ED with shortness of breath. She is on chronic coumadin for afib and followed in anti-coag clinic.  INR today is 2.43 after holding warfarin for two days. Will resume warfarin today at slightly lower dose.  Home dose is 10mg  every Monday and Thursday;  7.5mg  all other days.  Goal of Therapy:  INR 2-3 Monitor platelets by anticoagulation protocol: Yes   Plan:  Give  Warfarin 5mg  today x1 dose PT-INR daily  Monitor for S/S of bleeding  [name], Pharm. D. Clinical Pharmacist 08/23/2018 11:27 AM

## 2018-08-23 NOTE — Progress Notes (Signed)
Physical Therapy Treatment Patient Details Name: Sabrina Beck MRN: [id number] DOB: 1941-01-28 Today's Date: 08/23/2018    History of Present Illness Sabrina Beck is a 78 y.o. female with medical history significant of end-stage renal disease, permanent atrial fibrillation on chronic anticoagulation with Coumadin, chronic diastolic CHF, hypertension, GERD, presents to the hospital with chief complaint of significant shortness of breath.  Patient tells me that she has been bothered by URI type symptoms with sinus congestion, cough, for the past 2 to 3 weeks.  She continued to feel bad, and was evaluated by her cardiologist as an outpatient about 3 days ago, and chest x-ray at that time showed left perihilar airspace disease concerning for pneumonia.  She was placed on Levaquin.  Her symptoms have progressed in the last couple of days, had significant difficulties with breathing, and decided to come to the emergency room.  On arrival she was found to be hypoxic with oxygen sats in the low 80s on room air is requiring nasal cannula, as well as A. fib with RVR.  On my evaluation, patient appears tachypneic but relatively comfortable on 4 L nasal cannula.  She is not short of breath when she came in.  She denies any chest pain, denies any palpitations.  She denies any fevers but did say that she has had some subjective chills.  She complains of a cough with yellow sputum production.  She denies any abdominal pain, no nausea vomiting or diarrhea.  She states that she got dialyzed last yesterday, has not missed any sessions, however tells me that she does not think she had any fluid pulled on dialysis this whole week, "they just cleaned my blood".    PT Comments    Patient near baseline for functional mobility and gait, slightly limited secondary to generalized weakness and fatigue during activity. Patient ambulates on room air with O2 saturation ranging from 96-87%, with majority of the time hovering at 89%.  Patient performs supine to sit with min guard using elevated head of bed and increased time, requires min assist for sit to supine for lower extremity management due to fatigue after gait training. Patient continues to be oxygen dependent at this time due to desaturation when ambulating without oxygen and O2 sats improving >90% with 2 LPM. Patient will benefit from continued physical therapy in hospital and recommended venue below to increase strength, balance, endurance for safe ADLs and gait.    Follow Up Recommendations  Home health PT;Supervision for mobility/OOB;Supervision - Intermittent     Equipment Recommendations  Rolling walker with 5" wheels    Recommendations for Other Services       Precautions / Restrictions Precautions Precautions: Fall Restrictions Weight Bearing Restrictions: No    Mobility  Bed Mobility Overal bed mobility: Needs Assistance Bed Mobility: Supine to Sit;Sit to Supine     Supine to sit: HOB elevated;Min guard Sit to supine: Min assist   General bed mobility comments: slow labored movement, use of bed rail, min assist returning to supine for lower extremity management  Transfers Overall transfer level: Needs assistance Equipment used: Rolling walker (2 wheeled) Transfers: Sit to/from Stand Sit to Stand: Min guard Stand pivot transfers: Min guard       General transfer comment: slow labored movement  Ambulation/Gait Ambulation/Gait assistance: Min guard Gait Distance (Feet): 25 Feet Assistive device: Rolling walker (2 wheeled) Gait Pattern/deviations: Decreased step length - right;Decreased step length - left;Decreased stride length Gait velocity: decreased   General Gait Details: labored cadence  with unsteady turns, on room air and O2 saturation 87-90%   Stairs             Wheelchair Mobility    Modified Rankin (Stroke Patients Only)       Balance Overall balance assessment: Needs assistance Sitting-balance support:  Feet supported;No upper extremity supported Sitting balance-Leahy Scale: Good     Standing balance support: During functional activity;Bilateral upper extremity supported Standing balance-Leahy Scale: Fair Standing balance comment: with RW                            Cognition Arousal/Alertness: Awake/alert Behavior During Therapy: WFL for tasks assessed/performed Overall Cognitive Status: Within Functional Limits for tasks assessed                                        Exercises      General Comments        Pertinent Vitals/Pain Pain Assessment: No/denies pain    Home Living                      Prior Function            PT Goals (current goals can now be found in the care plan section) Acute Rehab PT Goals Patient Stated Goal: return home with family to assist PT Goal Formulation: With patient/family Time For Goal Achievement: 08/28/18 Potential to Achieve Goals: Good Progress towards PT goals: Progressing toward goals    Frequency    Min 2X/week      PT Plan Current plan remains appropriate    Co-evaluation              AM-PAC PT "6 Clicks" Mobility   Outcome Measure  Help needed turning from your back to your side while in a flat bed without using bedrails?: A Little Help needed moving from lying on your back to sitting on the side of a flat bed without using bedrails?: A Lot Help needed moving to and from a bed to a chair (including a wheelchair)?: A Little Help needed standing up from a chair using your arms (e.g., wheelchair or bedside chair)?: A Little Help needed to walk in hospital room?: A Little Help needed climbing 3-5 steps with a railing? : A Lot 6 Click Score: 16    End of Session   Activity Tolerance: Patient tolerated treatment well;Patient limited by fatigue Patient left: in bed;with call bell/phone within reach;with family/visitor present Nurse Communication: Mobility status PT Visit  Diagnosis: Unsteadiness on feet (R26.81);Other abnormalities of gait and mobility (R26.89);Muscle weakness (generalized) (M62.81)     Time: 6010-9323 PT Time Calculation (min) (ACUTE ONLY): 15 min  Charges:  $Gait Training: 8-22 mins                     4:05 PM, 08/23/18 Talbot Grumbling, PT, DPT Physical Therapist with [facility] [phone] mobile phone

## 2018-08-23 NOTE — Progress Notes (Addendum)
PROGRESS NOTE  Sabrina Beck XIP:[id number] DOB: 05/01/1941 DOA: 08/18/2018 PCP: Redmond School, MD  Brief History:   78 y.o.femalewith medical history significant ofend-stage renal disease, permanent atrial fibrillation on chronic anticoagulation with Coumadin, chronic diastolic CHF, hypertension, GERD, presents to the hospital with chief complaint of significant shortness of breath. She has been having upper respiratory type symptoms for the past 2 to 3 weeks, she was diagnosed with lobar pneumonia 3 days prior to admission as an outpatient and started on the Levaquin. She felt worse and came to the ED, and was also found to be in pulmonary edemaas well as in A. fib with RVR.  78 y.o.femalewith medical history significant ofend-stage renal disease, permanent atrial fibrillation on chronic anticoagulation with Coumadin, chronic diastolic CHF, hypertension, GERD, presents to the hospital with chief complaint of significant shortness of breath. She has been having upper respiratory type symptoms for the past 2 to 3 weeks, she was diagnosed with lobar pneumonia 3 days prior to admission as an outpatient and started on the Levaquin. She felt worse and came to the ED, and was also found to be in pulmonary edemaas well as in A. fib with RVR.  She was initially started on a diltiazem drip.  her metoprolol tartrate was gradually titrated up, and her diltiazem drip was subsequently weaned off.  The patient underwent dialysis with fluid removal and improvement of her respiratory status during the hospitalization.  However, her heart rate/atrial fibrillation was difficult to control.  Echocardiogram showed decrease in EF to 45%.  Diltiazem was discontinued.  Metoprolol was titrated up to help with heart rate control.  Cardiology agreed with the current plan.  Assessment/Plan: Acute hypoxic respiratory failure with hypoxia-multifactorial due tolobar pneumoniaas well as pulmonary  edema -Patient was admitted to stepdown and placed on supplemental oxygen. Nephrology was consulted and underwent emergent dialysis on 1/12 evening, as well as 11/13 -Wheezing has improved but stillthere, she is breathing more comfortable -Continue nebulizers, continue antibiotics, cultures are negative -Continue pulmicort to 0.5 mg bid -pt endorsed some indiscretion with fluids at home -1/16--Echo--EF 45%, mid inferior septal HK, PASP 21, mild TR/MR -Ambulatory pulse ox showed desaturation <88%--> patient will have to go home on supplemental QBHALP--3X  Acute systolic CHF -With decrease of the patient's EF, there is likely a component of decompensated CHF -Given the decrease in EF, discontinue diltiazem -Increase metoprolol tartrate to 200 mg twice daily  Bronchospasm -Continue Pulmicort to 0.5 mg bid -Continue prednisone to 40 mg daily -added atrovent neb  Atrial fibrillation, permanent, with RVR -This is likely in the setting of fluid overload, -she was placed on Cardizem infusion, continue along with a home metoprolol, -rateworse due to acute hypoxia and fluid overload. -Continue Coumadin per pharmacy -Discontinue diltiazem as the patient's EF has decreased -metoprolol increased to 200 mg bid -Appreciate cardiology consultation and recommendations  Hypertension -Blood pressure remained stable  Anemia in the setting of end-stage renal disease -Hemoglobin stable, no bleeding  GERD -Continue home PPI    Disposition Plan: Home 1/18 if stable Family Communication:Son updatedat bedside 1/15  Consultants:renal, cardiology  Code Status: FULL   DVT Prophylaxis:Coumadin   Procedures: As Listed in Progress Note Above  Antibiotics: Ceftriaxone1/12>>> Azithromycin1/12>>    Subjective: Patient denies fevers, chills, headache, chest pain, dyspnea, nausea, vomiting, diarrhea, abdominal pain, dysuria, hematuria, hematochezia, and  melena. She has some dyspnea with exertion  Objective: Vitals:   08/23/18 1445 08/23/18 1457 08/23/18 1500  08/23/18 1600  BP: 121/70  121/70 123/76  Pulse: 100  (!) 105 100  Resp: 16  20 20   Temp: 98 F (36.7 C)   98.1 F (36.7 C)  TempSrc: Oral   Oral  SpO2: 96% 97% 100% 92%  Weight: 95.8 kg     Height:        Intake/Output Summary (Last 24 hours) at 08/23/2018 1734 Last data filed at 08/23/2018 1430 Gross per 24 hour  Intake 240 ml  Output 2300 ml  Net -2060 ml   Weight change: -1.8 kg Exam:   General:  Pt is alert, follows commands appropriately, not in acute distress  HEENT: No icterus, No thrush, No neck mass, [place]/AT  Cardiovascular: IRRR, S1/S2, no rubs, no gallops  Respiratory: Bibasilar rales.  No wheezing.  Abdomen: Soft/+BS, non tender, non distended, no guarding  Extremities: No edema, No lymphangitis, No petechiae, No rashes, no synovitis   Data Reviewed: I have personally reviewed following labs and imaging studies Basic Metabolic Panel: Recent Labs  Lab 08/18/18 1056 08/18/18 1106 08/19/18 0522 08/20/18 0439 08/21/18 0411 08/23/18 0507  NA 137 136 139 139 139 137  K 4.4 4.5 4.0 4.4 4.7 4.0  CL 102 102 100 102 100 96*  CO2 18*  --  28 28 27 25   GLUCOSE 242* 239* 121* 124* 110* 98  BUN 50* 41* 28* 25* 47* 69*  CREATININE 9.57* 9.20* 6.34* 4.95* 7.34* 7.82*  CALCIUM 10.1  --  9.6 9.4 10.1 10.4*  PHOS  --   --   --   --   --  5.8*   Liver Function Tests: Recent Labs  Lab 08/18/18 1056 08/19/18 0522 08/23/18 0507  AST 33 24  --   ALT 36 31  --   ALKPHOS 65 57  --   BILITOT 1.2 1.0  --   PROT 6.8 6.4*  --   ALBUMIN 3.2* 2.9* 3.0*   No results for input(s): LIPASE, AMYLASE in the last 168 hours. No results for input(s): AMMONIA in the last 168 hours. Coagulation Profile: Recent Labs  Lab 08/19/18 0522 08/20/18 0439 08/21/18 0411 08/22/18 0430 08/23/18 0507  INR 2.42 2.34 3.04 3.00 2.43   CBC: Recent Labs  Lab 08/18/18 1056  08/18/18 1106 08/19/18 0522 08/20/18 0439 08/21/18 0411 08/23/18 1021  WBC 10.6*  --  9.6 9.3 10.1 11.6*  NEUTROABS 8.1*  --   --   --   --   --   HGB 9.3* 9.5* 8.9* 8.7* 8.0* 8.8*  HCT 29.7* 28.0* 27.5* 27.8* 25.6* 27.5*  MCV 107.2*  --  107.4* 108.6* 109.4* 107.0*  PLT 301  --  248 262 280 332   Cardiac Enzymes: No results for input(s): CKTOTAL, CKMB, CKMBINDEX, TROPONINI in the last 168 hours. BNP: Invalid input(s): POCBNP CBG: No results for input(s): GLUCAP in the last 168 hours. HbA1C: No results for input(s): HGBA1C in the last 72 hours. Urine analysis:    Component Value Date/Time   COLORURINE YELLOW 03/03/2012 1500   APPEARANCEUR CLEAR 03/03/2012 1500   LABSPEC 1.020 03/03/2012 1500   PHURINE 6.0 03/03/2012 1500   GLUCOSEU 100 (A) 03/03/2012 1500   HGBUR TRACE (A) 03/03/2012 1500   BILIRUBINUR NEGATIVE 03/03/2012 1500   KETONESUR NEGATIVE 03/03/2012 1500   PROTEINUR 100 (A) 03/03/2012 1500   UROBILINOGEN 0.2 03/03/2012 1500   NITRITE NEGATIVE 03/03/2012 1500   LEUKOCYTESUR NEGATIVE 03/03/2012 1500   Sepsis Labs: @LABRCNTIP (procalcitonin:4,lacticidven:4) ) Recent Results (from the past 240 hour(s))  Blood culture (routine x 2)     Status: None   Collection Time: 08/18/18 11:12 AM  Result Value Ref Range Status   Specimen Description BLOOD RIGHT HAND  Final   Special Requests   Final    BOTTLES DRAWN AEROBIC AND ANAEROBIC Blood Culture adequate volume   Culture   Final    NO GROWTH 5 DAYS Performed at [facility], [address], [place], [place] [address]    Report Status 08/23/2018 FINAL  Final  Blood culture (routine x 2)     Status: None   Collection Time: 08/18/18  1:50 PM  Result Value Ref Range Status   Specimen Description BLOOD RIGHT WRIST  Final   Special Requests   Final    BOTTLES DRAWN AEROBIC AND ANAEROBIC Blood Culture adequate volume   Culture   Final    NO GROWTH 5 DAYS Performed at [facility], [address], [place], [place]  [address]    Report Status 08/23/2018 FINAL  Final  Culture, sputum-assessment     Status: None   Collection Time: 08/18/18  2:15 PM  Result Value Ref Range Status   Specimen Description EXPECTORATED SPUTUM  Final   Special Requests NONE  Final   Sputum evaluation   Final    THIS SPECIMEN IS ACCEPTABLE FOR SPUTUM CULTURE Performed at [facility], [address], [place], [place] [address]    Report Status 08/19/2018 FINAL  Final  MRSA PCR Screening     Status: None   Collection Time: 08/18/18  2:15 PM  Result Value Ref Range Status   MRSA by PCR NEGATIVE NEGATIVE Final    Comment:        The GeneXpert MRSA Assay (FDA approved for NASAL specimens only), is one component of a comprehensive MRSA colonization surveillance program. It is not intended to diagnose MRSA infection nor to guide or monitor treatment for MRSA infections. Performed at [facility], [address] [place], [place] [address]   Culture, respiratory     Status: None   Collection Time: 08/18/18  2:15 PM  Result Value Ref Range Status   Specimen Description   Final    EXPECTORATED SPUTUM Performed at [facility], [address] [place], [place] [address]    Special Requests   Final    NONE Reflexed from [id number] Performed at [place] [place], [place] [address]    Gram Stain   Final    RARE WBC PRESENT, PREDOMINANTLY PMN MODERATE BUDDING YEAST SEEN MODERATE GRAM POSITIVE RODS RARE GRAM POSITIVE COCCI IN PAIRS Performed at [facility], [place] [address], [place], [place] [address]    Culture MODERATE CANDIDA DUBLINIENSIS  Final   Report Status 08/22/2018 FINAL  Final     Scheduled Meds: . azithromycin  500 mg Oral Daily  . budesonide (PULMICORT) nebulizer solution  0.5 mg Nebulization BID  . Chlorhexidine Gluconate Cloth  6 each Topical Q0600  . [START ON 08/26/2018] doxercalciferol  6 mcg Intravenous Q M,W,F-HD  . epoetin (EPOGEN/PROCRIT) injection  2,600 Units Intravenous Q  M,W,F-HD  . ipratropium  0.5 mg Nebulization Q6H  . lanthanum  1,000 mg Oral TID WC  . levalbuterol  0.63 mg Nebulization Q6H WA  . metoprolol tartrate  150 mg Oral BID  . multivitamin  1 tablet Oral QHS  . pantoprazole  40 mg Oral Daily  . predniSONE  40 mg Oral Q breakfast  . sevelamer carbonate  1,600 mg Oral TID WC  .  sevelamer carbonate  800 mg Oral With snacks  . Warfarin - Pharmacist Dosing Inpatient   Does not apply q1800   Continuous Infusions: . cefTRIAXone (ROCEPHIN)  IV 1 g (08/23/18 1709)    Procedures/Studies: Dg Chest 2 View  Result Date: 08/16/2018 CLINICAL DATA:  Cough with yellow phlegm, shortness of breath, and chest discomfort for a few weeks. EXAM: CHEST - 2 VIEW COMPARISON:  02/13/2018 FINDINGS: Patchy left perihilar airspace opacities of developed. Cardiomegaly. Low volumes. No pneumothorax. No pleural effusion. Right lung is relatively clear. IMPRESSION: Left perihilar airspace disease is worrisome for bronchopneumonia. Followup PA and lateral chest X-ray is recommended in 3-4 weeks following trial of antibiotic therapy to ensure resolution and exclude underlying malignancy. Electronically Signed   By: Marybelle Killings M.D.   On: 08/16/2018 08:34   Dg Chest Portable 1 View  Result Date: 08/18/2018 CLINICAL DATA:  Shortness of breath.  History of pneumonia. EXAM: PORTABLE CHEST 1 VIEW COMPARISON:  Chest radiograph 08/15/2018 FINDINGS: Motion artifact limits exam. Monitoring leads overlie the patient. Stable cardiomegaly. Aortic atherosclerosis. Interval increase in consolidative opacities left upper lung. Interval development of diffuse bilateral interstitial pulmonary opacities. IMPRESSION: Increasing consolidation within the left upper lung which may represent pneumonia. Followup PA and lateral chest X-ray is recommended in 3-4 weeks following trial of antibiotic therapy to ensure resolution and exclude underlying malignancy. Interval development of diffuse bilateral  interstitial opacities which may represent edema. Electronically Signed   By: Lovey Newcomer M.D.   On: 08/18/2018 11:47    Orson Eva, DO  [facility] Pager [phone]  If 7PM-7AM, please contact night-coverage [email or web address] Password [email or web address] 08/23/2018, 5:34 PM   LOS: 5 days

## 2018-08-23 NOTE — Care Management Note (Signed)
Case Management Note  Patient Details  Name: FALESHA SCHOMMER MRN: [id number] Date of Birth: 09-06-1940   Action/Plan: Patient qualifies for home oxygen. No preference on provider. Vaughan Basta of [facility] notified and will deliver portable oxygen tank to room prior to DC.   Expected Discharge Date:       08/23/18           Expected Discharge Plan:  Home/Self Care  In-House Referral:  NA  Discharge planning Services  CM Consult  Post Acute Care Choice:  Durable Medical Equipment, Home Health Choice offered to:  Patient  DME Arranged:  [place] rolling, Oxygen DME Agency:  [place]:  Patient Refused [place] Agency:     Status of Service:  Completed, signed off  If discussed at [place] of Stay Meetings, dates discussed:    Additional Comments:  Nayleah Gamel, Chauncey Reading, RN 08/23/2018, 2:20 PM

## 2018-08-23 NOTE — Progress Notes (Signed)
Patient ID: Sabrina Beck, female   DOB: July 07, 1941, 78 y.o.   MRN: [id number]  [facility] Progress Note    Subjective:   Seen on HD and feels better.  Tolerating HD well.   Objective:   BP 131/74   Pulse 68   Temp 97.9 F (36.6 C) (Oral)   Resp 19   Ht 5\' 4"  (1.626 m)   Wt 97.7 kg   SpO2 95%   BMI 36.97 kg/m   Intake/Output: I/O last 3 completed shifts: In: 100 [IV Piggyback:100] Out: -    Intake/Output this shift:  No intake/output data recorded. Weight change: -1.8 kg  Physical Exam: Gen:NAD CVS: no rub Resp: rales at LLL field, good air movement Abd: +BS, soft, NT/ND Ext: no edema, L FA AVF +T/B, aneurysmal changes of proximal AVF.  Labs: BMET Recent Labs  Lab 08/18/18 1056 08/18/18 1106 08/19/18 0522 08/20/18 0439 08/21/18 0411 08/23/18 0507  NA 137 136 139 139 139 137  K 4.4 4.5 4.0 4.4 4.7 4.0  CL 102 102 100 102 100 96*  CO2 18*  --  28 28 27 25   GLUCOSE 242* 239* 121* 124* 110* 98  BUN 50* 41* 28* 25* 47* 69*  CREATININE 9.57* 9.20* 6.34* 4.95* 7.34* 7.82*  ALBUMIN 3.2*  --  2.9*  --   --  3.0*  CALCIUM 10.1  --  9.6 9.4 10.1 10.4*  PHOS  --   --   --   --   --  5.8*   CBC Recent Labs  Lab 08/18/18 1056  08/19/18 0522 08/20/18 0439 08/21/18 0411 08/23/18 1021  WBC 10.6*  --  9.6 9.3 10.1 11.6*  NEUTROABS 8.1*  --   --   --   --   --   HGB 9.3*   < > 8.9* 8.7* 8.0* 8.8*  HCT 29.7*   < > 27.5* 27.8* 25.6* 27.5*  MCV 107.2*  --  107.4* 108.6* 109.4* 107.0*  PLT 301  --  248 262 280 332   < > = values in this interval not displayed.    @IMGRELPRIORS @ Medications:    . azithromycin  500 mg Oral Daily  . budesonide (PULMICORT) nebulizer solution  0.5 mg Nebulization BID  . calcium acetate  1,334 mg Oral TID WC  . Chlorhexidine Gluconate Cloth  6 each Topical Q0600  . doxercalciferol  8 mcg Intravenous Q M,W,F-HD  . epoetin (EPOGEN/PROCRIT) injection  2,600 Units Intravenous Q M,W,F-HD  . ipratropium  0.5 mg  Nebulization Q6H  . levalbuterol  0.63 mg Nebulization Q6H WA  . metoprolol tartrate  150 mg Oral BID  . multivitamin  1 tablet Oral QHS  . pantoprazole  40 mg Oral Daily  . predniSONE  40 mg Oral Q breakfast  . sevelamer carbonate  1,600 mg Oral TID WC  . sevelamer carbonate  800 mg Oral With snacks  . warfarin  5 mg Oral Once  . Warfarin - Pharmacist Dosing Inpatient   Does not apply [id number]     Assessment/ Plan:   1. CAP- per primary svc. On prednisone and abx 2. CMP/Hypotension- d/w Dr. Domenic Polite.  Not a significant change in EF, cont with beta-blocker and monitor post recovery 3. ESRD - cont with HD qMWF, tolerating HD well. 4. Anemia:drop in H/H due to critical illness. Cont with ESA and follow 5. CKD-MBD: ^Ca and phos.  Stop calcium acetate and start fosrenol and follow.  6. Nutrition: renal diet 7. Hypertension: stable  8. A fib- on metoprolol and coumadin. 9. Disposition- per primary svc.  Donetta Potts, MD [place] Pager [phone] 08/23/2018, 11:54 AM

## 2018-08-24 LAB — PROTIME-INR
INR: 1.63
Prothrombin Time: 19.1 seconds — ABNORMAL HIGH (ref 11.4–15.2)

## 2018-08-24 MED ORDER — LANTHANUM CARBONATE 1000 MG PO CHEW: 1000.0000 mg | CHEWABLE_TABLET | Freq: Three times a day (TID) | ORAL | 1 refills | Status: DC

## 2018-08-24 MED ORDER — METOPROLOL TARTRATE 100 MG PO TABS: 200.0000 mg | ORAL_TABLET | Freq: Two times a day (BID) | ORAL | 1 refills | Status: DC

## 2018-08-24 MED ORDER — IPRATROPIUM BROMIDE 0.02 % IN SOLN
0.5000 mg | Freq: Four times a day (QID) | RESPIRATORY_TRACT | Status: DC
  Administered 2018-08-24 (×2): 0.5 mg via RESPIRATORY_TRACT
  Filled 2018-08-24 (×3): qty 2.5

## 2018-08-24 MED ORDER — WARFARIN SODIUM 5 MG PO TABS
10.0000 mg | ORAL_TABLET | Freq: Once | ORAL | Status: AC
  Administered 2018-08-24: 10 mg via ORAL
  Filled 2018-08-24: qty 2

## 2018-08-24 NOTE — Progress Notes (Signed)
Physician Discharge Summary  Sabrina Beck KGY:[id number] DOB: [birth year] DOA: 08/18/2018  PCP: Redmond School, MD  Admit date: 08/18/2018 Discharge date: 08/24/2018  Admitted From: Home Disposition:  Home   Recommendations for Outpatient Follow-up:  1. Follow up with PCP in 1-2 weeks 2. Please obtain BMP/CBC in one week   Home Health: YES Equipment/Devices: RW, HHPT  Discharge Condition: Stable CODE STATUS: FULL Diet recommendation: Heart Healthy    Brief/Interim Summary: 78 y.o.femalewith medical history significant ofend-stage renal disease, permanent atrial fibrillation on chronic anticoagulation with Coumadin, chronic diastolic CHF, hypertension, GERD, presents to the hospital with chief complaint of significant shortness of breath. She has been having upper respiratory type symptoms for the past 2 to 3 weeks, she was diagnosed with lobar pneumonia 3 days prior to admission as an outpatient and started on the Levaquin. She felt worse and came to the ED, and was also found to be in pulmonary edemaas well as in A. fib with RVR.78 y.o.femalewith medical history significant ofend-stage renal disease, permanent atrial fibrillation on chronic anticoagulation with Coumadin, chronic diastolic CHF, hypertension, GERD, presents to the hospital with chief complaint of significant shortness of breath. She has been having upper respiratory type symptoms for the past 2 to 3 weeks, she was diagnosed with lobar pneumonia 3 days prior to admission as an outpatient and started on the Levaquin. She felt worse and came to the ED, and was also found to be in pulmonary edemaas well as in A. fib with RVR.She was initially started on a diltiazemdrip. her metoprolol tartrate was gradually titrated up, and her diltiazem drip was subsequently weaned off.  The patient underwent dialysis with fluid removal and improvement of her respiratory status during the hospitalization.  However, her heart  rate/atrial fibrillation was difficult to control.  Echocardiogram showed decrease in EF to 45%.  Diltiazem was discontinued.  Metoprolol was titrated up to help with heart rate control.  Cardiology agreed with the current plan.   Discharge Diagnoses:  Acute hypoxic respiratory failure with hypoxia-multifactorial due tolobar pneumoniaas well as pulmonary edema -Patient was admitted to stepdown and placed on supplemental oxygen. Nephrology was consulted and underwent emergent dialysis on 1/12 evening, as well as 11/13 -Wheezing has improved but stillthere, she is breathing more comfortable -Continue nebulizers, continue antibiotics, cultures are negative -Continue pulmicort to 0.5 mg bid during hospitalization -pt endorsed some indiscretion with fluids at home -1/16--Echo--EF 45%, mid inferior septal HK, PASP 21, mild TR/MR -Ambulatory pulse ox showed desaturation<88%-->patient will have to go home on supplemental oxygen--2L -finished 7 days abx during hospitalization  Acute systolic CHF -Withdecrease of the patient's EF, there is likely a component of decompensated CHF -Given the decrease in EF, discontinue diltiazem -Increased metoprolol tartrate to 200 mg twice daily  Bronchospasm -Continue Pulmicort to 0.5 mg bid -Continueprednisone to 40 mg daily -addedatrovent neb -improved  Atrial fibrillation, permanent, with RVR -This is likely in the setting of fluid overload, -she was placed on Cardizem infusion, continue along with a home metoprolol, -rateworse due to acute hypoxia and fluid overload. 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   lanthanum 1000 MG chewable tablet Commonly known as:  FOSRENOL Chew 1 tablet (1,000 mg total) by mouth 3 (three) times daily with meals.   metoprolol tartrate 100 MG tablet Commonly known as:  LOPRESSOR Take 2 tablets (200 mg total) by mouth 2 (two) times daily. What changed:    medication strength  See the new instructions.   multivitamin Tabs tablet Take by mouth.   Omega-3 Fish Oil 300 MG Caps Take 300 mg by mouth 2 (two) times daily.   omeprazole 20 MG capsule Commonly known as:  PRILOSEC Take 20 mg by mouth daily.   PRESERVISION AREDS 2 PO Take 1 tablet by mouth 2 (two) times daily.   RENVELA 800 MG tablet Generic drug:  sevelamer carbonate Take 800-1,600 mg by mouth See admin instructions. Takes 1 tablet with snacks and 2 tablets with meals   SYSTANE BALANCE 0.6 % Soln Generic drug:  Propylene Glycol Place 1 drop into both eyes 3 (three) times daily as needed (for burning or dry eyes).   Vitamin D 50 MCG (2000 UT) Caps Take 2,000 Units by mouth daily.   warfarin 5 MG tablet Commonly known as:  COUMADIN Take as directed. If you are unsure how to take this medication, talk to your nurse or doctor. Original instructions:  Take 1.5 to 2 tablets by mouth daily as directed by coumadin clinic What changed:    how much to take  how to take this  when to take this  additional instructions            Durable Medical Equipment  (From admission, onward)         Start     Ordered   08/23/18  1417  For home use only DME oxygen  Once    Question Answer Comment  Mode or (Route) Nasal cannula   Liters per Minute 2   Frequency Continuous (stationary and portable oxygen unit needed)   Oxygen conserving device Yes   Oxygen delivery system Gas      08/23/18 1416   08/22/18 0623  For home use only DME Walker rolling  Once    Question:  Patient needs a walker to treat with the following condition  Answer:  Gait instability   08/22/18 0622          No Known Allergies  Consultations:  Renal  cardiology   Procedures/Studies: Dg Chest 2 View  Result Date: 08/16/2018 CLINICAL DATA:  Cough with yellow phlegm, shortness of breath, and chest discomfort for a few weeks. EXAM: CHEST - 2 VIEW COMPARISON:  02/13/2018 FINDINGS: Patchy left perihilar airspace opacities of developed. Cardiomegaly. Low volumes. No pneumothorax. No pleural effusion. Right lung is relatively clear. IMPRESSION: Left perihilar airspace disease is worrisome for bronchopneumonia. Followup PA and lateral chest X-ray is recommended in 3-4 weeks following trial of antibiotic therapy to ensure resolution and exclude underlying malignancy. Electronically Signed   By: Marybelle Killings M.D.   On: 08/16/2018 08:34   Dg Chest Portable 1 View  Result Date: 08/18/2018 CLINICAL DATA:  Shortness of breath.  History of pneumonia. EXAM: PORTABLE CHEST 1  VIEW COMPARISON:  Chest radiograph 08/15/2018 FINDINGS: Motion artifact limits exam. Monitoring leads overlie the patient. Stable cardiomegaly. Aortic atherosclerosis. Interval increase in consolidative opacities left upper lung. Interval development of diffuse bilateral interstitial pulmonary opacities. IMPRESSION: Increasing consolidation within the left upper lung which may represent pneumonia. Followup PA and lateral chest X-ray is recommended in 3-4 weeks following trial of antibiotic therapy to ensure resolution and exclude underlying malignancy. Interval development of diffuse  bilateral interstitial opacities which may represent edema. Electronically Signed   By: Lovey Newcomer M.D.   On: 08/18/2018 11:47        Discharge Exam: Vitals:   08/24/18 1102 08/24/18 1453  BP:    Pulse: 64   Resp: (!) 25   Temp: 97.6 F (36.4 C)   SpO2: 100% 97%   Vitals:   08/24/18 1000 08/24/18 1100 08/24/18 1102 08/24/18 1453  BP: (!) 115/94     Pulse: (!) 102 (!) 115 64   Resp: 18 (!) 21 (!) 25   Temp:   97.6 F (36.4 C)   TempSrc:   Oral   SpO2: 100% 97% 100% 97%  Weight:      Height:        General: Pt is alert, awake, not in acute distress Cardiovascular: IRRR, S1/S2 +, no rubs, no gallops Respiratory: bibasilar crackles, R>L; no wheeze Abdominal: Soft, NT, ND, bowel sounds + Extremities: no edema, no cyanosis   The results of significant diagnostics from this hospitalization (including imaging, microbiology, ancillary and laboratory) are listed below for reference.    Significant Diagnostic Studies: Dg Chest 2 View  Result Date: 08/16/2018 CLINICAL DATA:  Cough with yellow phlegm, shortness of breath, and chest discomfort for a few weeks. EXAM: CHEST - 2 VIEW COMPARISON:  02/13/2018 FINDINGS: Patchy left perihilar airspace opacities of developed. Cardiomegaly. Low volumes. No pneumothorax. No pleural effusion. Right lung is relatively clear. IMPRESSION: Left perihilar airspace disease is worrisome for bronchopneumonia. Followup PA and lateral chest X-ray is recommended in 3-4 weeks following trial of antibiotic therapy to ensure resolution and exclude underlying malignancy. Electronically Signed   By: Marybelle Killings M.D.   On: 08/16/2018 08:34   Dg Chest Portable 1 View  Result Date: 08/18/2018 CLINICAL DATA:  Shortness of breath.  History of pneumonia. EXAM: PORTABLE CHEST 1 VIEW COMPARISON:  Chest radiograph 08/15/2018 FINDINGS: Motion artifact limits exam. Monitoring leads overlie the patient. Stable cardiomegaly. Aortic atherosclerosis. Interval increase in  consolidative opacities left upper lung. Interval development of diffuse bilateral interstitial pulmonary opacities. IMPRESSION: Increasing consolidation within the left upper lung which may represent pneumonia. Followup PA and lateral chest X-ray is recommended in 3-4 weeks following trial of antibiotic therapy to ensure resolution and exclude underlying malignancy. Interval development of diffuse bilateral interstitial opacities which may represent edema. Electronically Signed   By: Lovey Newcomer M.D.   On: 08/18/2018 11:47     Microbiology: Recent Results (from the past 240 hour(s))  Blood culture (routine x 2)     Status: None   Collection Time: 08/18/18 11:12 AM  Result Value Ref Range Status   Specimen Description BLOOD RIGHT HAND  Final   Special Requests   Final    BOTTLES DRAWN AEROBIC AND ANAEROBIC Blood Culture adequate volume   Culture   Final    NO GROWTH 5 DAYS Performed at [facility], [address] [place], [place] [address]    Report Status 08/23/2018 FINAL  Final  Blood culture (routine x 2)  Status: None   Collection Time: 08/18/18  1:50 PM  Result Value Ref Range Status   Specimen Description BLOOD RIGHT WRIST  Final   Special Requests   Final    BOTTLES DRAWN AEROBIC AND ANAEROBIC Blood Culture adequate volume   Culture   Final    NO GROWTH 5 DAYS Performed at [facility], [address] [place], [place] [address]    Report Status 08/23/2018 FINAL  Final  Culture, sputum-assessment     Status: None   Collection Time: 08/18/18  2:15 PM  Result Value Ref Range Status   Specimen Description EXPECTORATED SPUTUM  Final   Special Requests NONE  Final   Sputum evaluation   Final    THIS SPECIMEN IS ACCEPTABLE FOR SPUTUM CULTURE Performed at [facility], [address] [place], [place] [address]    Report Status 08/19/2018 FINAL  Final  MRSA PCR Screening     Status: None   Collection Time: 08/18/18  2:15 PM  Result Value Ref Range Status   MRSA by PCR  NEGATIVE NEGATIVE Final    Comment:        The GeneXpert MRSA Assay (FDA approved for NASAL specimens only), is one component of a comprehensive MRSA colonization surveillance program. It is not intended to diagnose MRSA infection nor to guide or monitor treatment for MRSA infections. Performed at [facility], [address] [place], [place] [address]   Culture, respiratory     Status: None   Collection Time: 08/18/18  2:15 PM  Result Value Ref Range Status   Specimen Description   Final    EXPECTORATED SPUTUM Performed at [facility], [address], [place], [place] [address]    Special Requests   Final    NONE Reflexed from [id number] Performed at [place] [place], [place] [address]    Gram Stain   Final    RARE WBC PRESENT, PREDOMINANTLY PMN MODERATE BUDDING YEAST SEEN MODERATE GRAM POSITIVE RODS RARE GRAM POSITIVE COCCI IN PAIRS Performed at [facility], [place] [address], [place],  [address]    Culture MODERATE CANDIDA DUBLINIENSIS  Final   Report Status 08/22/2018 FINAL  Final     Labs: Basic Metabolic Panel: Recent Labs  Lab 08/18/18 1056 08/18/18 1106 08/19/18 0522 08/20/18 0439 08/21/18 0411 08/23/18 0507  NA 137 136 139 139 139 137  K 4.4 4.5 4.0 4.4 4.7 4.0  CL 102 102 100 102 100 96*  CO2 18*  --  28 28 27 25   GLUCOSE 242* 239* 121* 124* 110* 98  BUN 50* 41* 28* 25* 47* 69*  CREATININE 9.57* 9.20* 6.34* 4.95* 7.34* 7.82*  CALCIUM 10.1  --  9.6 9.4 10.1 10.4*  PHOS  --   --   --   --   --  5.8*   Liver Function Tests: Recent Labs  Lab 08/18/18 1056 08/19/18 0522 08/23/18 0507  AST 33 24  --   ALT 36 31  --   ALKPHOS 65 57  --   BILITOT 1.2 1.0  --   PROT 6.8 6.4*  --   ALBUMIN 3.2* 2.9* 3.0*   No results for input(s): LIPASE, AMYLASE in the last 168 hours. No results for input(s): AMMONIA in the last 168 hours. CBC: Recent Labs  Lab 08/18/18 1056 08/18/18 1106 08/19/18 0522 08/20/18 0439 08/21/18 0411  08/23/18 1021  WBC 10.6*  --  9.6 9.3 10.1 11.6*  NEUTROABS 8.1*  --   --   --   --   --  HGB 9.3* 9.5* 8.9* 8.7* 8.0* 8.8*  HCT 29.7* 28.0* 27.5* 27.8* 25.6* 27.5*  MCV 107.2*  --  107.4* 108.6* 109.4* 107.0*  PLT 301  --  248 262 280 332   Cardiac Enzymes: No results for input(s): CKTOTAL, CKMB, CKMBINDEX, TROPONINI in the last 168 hours. BNP: Invalid input(s): POCBNP CBG: No results for input(s): GLUCAP in the last 168 hours.  Time coordinating discharge:  36 minutes  Signed:  Orson Eva, DO [facility] Pager: [phone] 08/24/2018, 3:01 PM

## 2018-08-24 NOTE — Discharge Summary (Addendum)
Physician Discharge Summary  Sabrina Beck KXF:[id number] DOB: [birth year] DOA: 08/18/2018  PCP: Redmond School, MD  Admit date: 08/18/2018 Discharge date: 08/24/2018  Admitted From: Home Disposition:  Home   Recommendations for Outpatient Follow-up:  1. Follow up with PCP in 1-2 weeks 2. Please obtain BMP/CBC in one week 3. Please wean oxygen for saturaion >92% 4. Please check INR on 1/20 or 1/21 and adjust coumadin accordingly   Home Health: YES Equipment/Devices: RW, HHPT; 2 LNC  Discharge Condition: Stable CODE STATUS: FULL Diet recommendation: Heart Healthy    Brief/Interim Summary: 78 y.o.femalewith medical history significant ofend-stage renal disease, permanent atrial fibrillation on chronic anticoagulation with Coumadin, chronic diastolic CHF, hypertension, GERD, presents to the hospital with chief complaint of significant shortness of breath. She has been having upper respiratory type symptoms for the past 2 to 3 weeks, she was diagnosed with lobar pneumonia 3 days prior to admission as an outpatient and started on the Levaquin. She felt worse and came to the ED, and was also found to be in pulmonary edemaas well as in A. fib with RVR.78 y.o.femalewith medical history significant ofend-stage renal disease, permanent atrial fibrillation on chronic anticoagulation with Coumadin, chronic diastolic CHF, hypertension, GERD, presents to the hospital with chief complaint of significant shortness of breath. She has been having upper respiratory type symptoms for the past 2 to 3 weeks, she was diagnosed with lobar pneumonia 3 days prior to admission as an outpatient and started on the Levaquin. She felt worse and came to the ED, and was also found to be in pulmonary edemaas well as in A. fib with RVR.She was initially started on a diltiazemdrip. her metoprolol tartrate was gradually titrated up, and her diltiazem drip was subsequently weaned off.  The patient underwent  dialysis with fluid removal and improvement of her respiratory status during the hospitalization.  However, her heart rate/atrial fibrillation was difficult to control.  Echocardiogram showed decrease in EF to 45%.  Diltiazem was discontinued.  Metoprolol was titrated up to help with heart rate control.  Cardiology agreed with the current plan.   Discharge Diagnoses:  Acute hypoxic respiratory failure with hypoxia-multifactorial due tolobar pneumoniaas well as pulmonary edema -Patient was admitted to stepdown and placed on supplemental oxygen. Nephrology was consulted and underwent emergent dialysis on 1/12 evening, as well as 11/13 -Wheezing has improved but stillthere, she is breathing more comfortable -Continue nebulizers, continue antibiotics, cultures are negative -Continue pulmicort to 0.5 mg bid during hospitalization -pt endorsed some indiscretion with fluids at home -1/16--Echo--EF 45%, mid inferior septal HK, PASP 21, mild TR/MR -Ambulatory pulse ox showed desaturation<88%-->patient will have to go home on supplemental oxygen--2L -finished 7 days abx during hospitalization  Acute systolic CHF -Withdecrease of the patient's EF, there is likely a component of decompensated CHF -Given the decrease in EF, discontinue diltiazem -Increased metoprolol tartrate to 200 mg twice daily  Bronchospasm -Continue Pulmicort to 0.5 mg bid -Continueprednisone to 40 mg daily -addedatrovent neb -improved  Atrial fibrillation, permanent, with RVR -This is likely in the setting of fluid overload, -she was placed on Cardizem infusion, continue along with a home metoprolol, -rateworse due to acute hypoxia and fluid overload. 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   lanthanum 1000 MG chewable tablet Commonly known as:  FOSRENOL Chew 1 tablet (1,000 mg total) by mouth 3 (three) times daily with meals.   metoprolol tartrate 100 MG tablet Commonly known as:  LOPRESSOR Take 2 tablets (200 mg total) by mouth 2 (two) times daily. What changed:    medication strength  See the new instructions.   multivitamin Tabs tablet Take by mouth.   Omega-3 Fish Oil 300 MG Caps Take 300 mg by mouth 2 (two) times daily.   omeprazole 20 MG capsule Commonly known as:  PRILOSEC Take 20 mg by mouth daily.   PRESERVISION AREDS 2 PO Take 1 tablet by mouth 2 (two) times daily.   RENVELA 800 MG tablet Generic drug:  sevelamer carbonate Take 800-1,600 mg by mouth See admin instructions. Takes 1 tablet with snacks and 2 tablets with meals   SYSTANE BALANCE 0.6 % Soln Generic drug:  Propylene Glycol Place 1 drop into both eyes 3 (three) times daily as needed (for burning or dry eyes).   Vitamin D 50 MCG (2000 UT) Caps Take 2,000 Units by mouth daily.   warfarin 5 MG tablet Commonly known as:  COUMADIN Take as directed. If you are unsure how to take this medication, talk to your nurse or doctor. Original instructions:  Take 1.5 to 2 tablets by mouth daily as directed by coumadin clinic What changed:    how much to take  how to take this  when to take this  additional  instructions            Durable Medical Equipment  (From admission, onward)         Start     Ordered   08/23/18 1417  For home use only DME oxygen  Once    Question Answer Comment  Mode or (Route) Nasal cannula   Liters per Minute 2   Frequency Continuous (stationary and portable oxygen unit needed)   Oxygen conserving device Yes   Oxygen delivery system Gas      08/23/18 1416   08/22/18 0623  For home use only DME Walker rolling  Once    Question:  Patient needs a walker to treat with the following condition  Answer:  Gait instability   08/22/18 0622          No Known Allergies  Consultations:  Renal  cardiology   Procedures/Studies: Dg Chest 2 View  Result Date: 08/16/2018 CLINICAL DATA:  Cough with yellow phlegm, shortness of breath, and chest discomfort for a few weeks. EXAM: CHEST - 2 VIEW COMPARISON:  02/13/2018 FINDINGS: Patchy left perihilar airspace opacities of developed. Cardiomegaly. Low volumes. No pneumothorax. No pleural effusion. Right lung is relatively clear. IMPRESSION: Left perihilar airspace disease is worrisome for bronchopneumonia. Followup PA and lateral chest X-ray is recommended in 3-4 weeks following trial of antibiotic therapy to ensure resolution and exclude underlying malignancy. Electronically Signed   By: Marybelle Killings M.D.   On: 08/16/2018 08:34  Dg Chest Portable 1 View  Result Date: 08/18/2018 CLINICAL DATA:  Shortness of breath.  History of pneumonia. EXAM: PORTABLE CHEST 1 VIEW COMPARISON:  Chest radiograph 08/15/2018 FINDINGS: Motion artifact limits exam. Monitoring leads overlie the patient. Stable cardiomegaly. Aortic atherosclerosis. Interval increase in consolidative opacities left upper lung. Interval development of diffuse bilateral interstitial pulmonary opacities. IMPRESSION: Increasing consolidation within the left upper lung which may represent pneumonia. Followup PA and lateral chest X-ray is recommended in 3-4 weeks  following trial of antibiotic therapy to ensure resolution and exclude underlying malignancy. Interval development of diffuse bilateral interstitial opacities which may represent edema. Electronically Signed   By: Lovey Newcomer M.D.   On: 08/18/2018 11:47        Discharge Exam: Vitals:   08/24/18 1102 08/24/18 1453  BP:    Pulse: 64   Resp: (!) 25   Temp: 97.6 F (36.4 C)   SpO2: 100% 97%   Vitals:   08/24/18 1000 08/24/18 1100 08/24/18 1102 08/24/18 1453  BP: (!) 115/94     Pulse: (!) 102 (!) 115 64   Resp: 18 (!) 21 (!) 25   Temp:   97.6 F (36.4 C)   TempSrc:   Oral   SpO2: 100% 97% 100% 97%  Weight:      Height:        General: Pt is alert, awake, not in acute distress Cardiovascular: IRRR, S1/S2 +, no rubs, no gallops Respiratory: bibasilar crackles, R>L; no wheeze Abdominal: Soft, NT, ND, bowel sounds + Extremities: no edema, no cyanosis   The results of significant diagnostics from this hospitalization (including imaging, microbiology, ancillary and laboratory) are listed below for reference.    Significant Diagnostic Studies: Dg Chest 2 View  Result Date: 08/16/2018 CLINICAL DATA:  Cough with yellow phlegm, shortness of breath, and chest discomfort for a few weeks. EXAM: CHEST - 2 VIEW COMPARISON:  02/13/2018 FINDINGS: Patchy left perihilar airspace opacities of developed. Cardiomegaly. Low volumes. No pneumothorax. No pleural effusion. Right lung is relatively clear. IMPRESSION: Left perihilar airspace disease is worrisome for bronchopneumonia. Followup PA and lateral chest X-ray is recommended in 3-4 weeks following trial of antibiotic therapy to ensure resolution and exclude underlying malignancy. Electronically Signed   By: Marybelle Killings M.D.   On: 08/16/2018 08:34   Dg Chest Portable 1 View  Result Date: 08/18/2018 CLINICAL DATA:  Shortness of breath.  History of pneumonia. EXAM: PORTABLE CHEST 1 VIEW COMPARISON:  Chest radiograph 08/15/2018 FINDINGS: Motion  artifact limits exam. Monitoring leads overlie the patient. Stable cardiomegaly. Aortic atherosclerosis. Interval increase in consolidative opacities left upper lung. Interval development of diffuse bilateral interstitial pulmonary opacities. IMPRESSION: Increasing consolidation within the left upper lung which may represent pneumonia. Followup PA and lateral chest X-ray is recommended in 3-4 weeks following trial of antibiotic therapy to ensure resolution and exclude underlying malignancy. Interval development of diffuse bilateral interstitial opacities which may represent edema. Electronically Signed   By: Lovey Newcomer M.D.   On: 08/18/2018 11:47     Microbiology: Recent Results (from the past 240 hour(s))  Blood culture (routine x 2)     Status: None   Collection Time: 08/18/18 11:12 AM  Result Value Ref Range Status   Specimen Description BLOOD RIGHT HAND  Final   Special Requests   Final    BOTTLES DRAWN AEROBIC AND ANAEROBIC Blood Culture adequate volume   Culture   Final    NO GROWTH 5 DAYS Performed at [facility], [address] [place], [place] [address]    Report Status 08/23/2018 FINAL  Final  Blood culture (routine x 2)     Status: None   Collection Time: 08/18/18  1:50 PM  Result Value Ref Range Status   Specimen Description BLOOD RIGHT WRIST  Final   Special Requests   Final    BOTTLES DRAWN AEROBIC AND ANAEROBIC Blood Culture adequate volume   Culture   Final    NO GROWTH 5 DAYS Performed at [facility], [address], [place], [place] [address]    Report Status 08/23/2018 FINAL  Final  Culture, sputum-assessment     Status: None   Collection Time: 08/18/18  2:15 PM  Result Value Ref Range Status   Specimen Description EXPECTORATED SPUTUM  Final   Special Requests NONE  Final   Sputum evaluation   Final    THIS SPECIMEN IS ACCEPTABLE FOR SPUTUM CULTURE Performed at [facility], [address] [place], [place] [address]    Report Status 08/19/2018 FINAL  Final    MRSA PCR Screening     Status: None   Collection Time: 08/18/18  2:15 PM  Result Value Ref Range Status   MRSA by PCR NEGATIVE NEGATIVE Final    Comment:        The GeneXpert MRSA Assay (FDA approved for NASAL specimens only), is one component of a comprehensive MRSA colonization surveillance program. It is not intended to diagnose MRSA infection nor to guide or monitor treatment for MRSA infections. Performed at [facility], [address], [place], [place] [address]   Culture, respiratory     Status: None   Collection Time: 08/18/18  2:15 PM  Result Value Ref Range Status   Specimen Description   Final    EXPECTORATED SPUTUM Performed at [facility], [address] [place], [place] [address]    Special Requests   Final    NONE Reflexed from [phone] Performed at [place] [place], [place] [address]    Gram Stain   Final    RARE WBC PRESENT, PREDOMINANTLY PMN MODERATE BUDDING YEAST SEEN MODERATE GRAM POSITIVE RODS RARE GRAM POSITIVE COCCI IN PAIRS Performed at [facility], [place] [address] [place],  [address]    Culture MODERATE CANDIDA DUBLINIENSIS  Final   Report Status 08/22/2018 FINAL  Final     Labs: Basic Metabolic Panel: Recent Labs  Lab 08/18/18 1056 08/18/18 1106 08/19/18 0522 08/20/18 0439 08/21/18 0411 08/23/18 0507  NA 137 136 139 139 139 137  K 4.4 4.5 4.0 4.4 4.7 4.0  CL 102 102 100 102 100 96*  CO2 18*  --  28 28 27 25   GLUCOSE 242* 239* 121* 124* 110* 98  BUN 50* 41* 28* 25* 47* 69*  CREATININE 9.57* 9.20* 6.34* 4.95* 7.34* 7.82*  CALCIUM 10.1  --  9.6 9.4 10.1 10.4*  PHOS  --   --   --   --   --  5.8*   Liver Function Tests: Recent Labs  Lab 08/18/18 1056 08/19/18 0522 08/23/18 0507  AST 33 24  --   ALT 36 31  --   ALKPHOS 65 57  --   BILITOT 1.2 1.0  --   PROT 6.8 6.4*  --   ALBUMIN 3.2* 2.9* 3.0*   No results for input(s): LIPASE, AMYLASE in the last 168 hours. No results for input(s): AMMONIA  in the last 168 hours. CBC: Recent Labs  Lab 08/18/18 1056 08/18/18 1106  08/19/18 0522 08/20/18 0439 08/21/18 0411 08/23/18 1021  WBC 10.6*  --  9.6 9.3 10.1 11.6*  NEUTROABS 8.1*  --   --   --   --   --   HGB 9.3* 9.5* 8.9* 8.7* 8.0* 8.8*  HCT 29.7* 28.0* 27.5* 27.8* 25.6* 27.5*  MCV 107.2*  --  107.4* 108.6* 109.4* 107.0*  PLT 301  --  248 262 280 332   Cardiac Enzymes: No results for input(s): CKTOTAL, CKMB, CKMBINDEX, TROPONINI in the last 168 hours. BNP: Invalid input(s): POCBNP CBG: No results for input(s): GLUCAP in the last 168 hours.  Time coordinating discharge:  36 minutes  Signed:  Orson Eva, DO [facility] Pager: [phone] 08/24/2018, 3:04 PM

## 2018-08-24 NOTE — Progress Notes (Addendum)
ANTICOAGULATION CONSULT NOTE - follow up [facility] for Coumadin Indication: atrial fibrillation  No Known Allergies  Patient Measurements: Height: 5\' 4"  (162.6 cm) Weight: 210 lb 1.6 oz (95.3 kg) IBW/kg (Calculated) : 54.7  Vital Signs: Temp: 97.6 F (36.4 C) (01/18 0723) Temp Source: Oral (01/18 0723) BP: 144/66 (01/18 0300) Pulse Rate: 103 (01/18 0723)  Labs: Recent Labs    08/22/18 0430 08/23/18 0507 08/23/18 1021 08/24/18 0403  HGB  --   --  8.8*  --   HCT  --   --  27.5*  --   PLT  --   --  332  --   LABPROT 30.7* 26.1*  --  19.1*  INR 3.00 2.43  --  1.63  CREATININE  --  7.82*  --   --     Estimated Creatinine Clearance: 6.5 mL/min (A) (by C-G formula based on SCr of 7.82 mg/dL (H)).  Assessment: 78 yo female presented to ED with shortness of breath. She is on chronic coumadin for afib and followed in anti-coag clinic.  INR subtherapeutic today. Home dose is 10mg  every Monday and Thursday;  7.5mg  all other days.  Goal of Therapy:  INR 2-3 Monitor platelets by anticoagulation protocol: Yes   Plan:  Warfarin 10 mg today x1 dose PT-INR daily  Monitor for S/S of bleeding  Margot Ables, PharmD Clinical Pharmacist 08/24/2018 8:13 AM

## 2018-08-26 ENCOUNTER — Ambulatory Visit (INDEPENDENT_AMBULATORY_CARE_PROVIDER_SITE_OTHER): Payer: Medicare HMO | Admitting: Pharmacist

## 2018-08-26 DIAGNOSIS — I4821 Permanent atrial fibrillation: Secondary | ICD-10-CM

## 2018-08-26 DIAGNOSIS — Z992 Dependence on renal dialysis: Secondary | ICD-10-CM | POA: Diagnosis not present

## 2018-08-26 DIAGNOSIS — Z5181 Encounter for therapeutic drug level monitoring: Secondary | ICD-10-CM

## 2018-08-26 DIAGNOSIS — I4891 Unspecified atrial fibrillation: Secondary | ICD-10-CM

## 2018-08-26 DIAGNOSIS — N186 End stage renal disease: Secondary | ICD-10-CM | POA: Diagnosis not present

## 2018-08-26 LAB — POCT INR: INR: 3.1 — AB (ref 2.0–3.0)

## 2018-08-26 NOTE — Patient Instructions (Addendum)
Description   No warfarin today then continue to 1 1/2 tablets daily except 2 tablets on Mondays and Thursdays Recheck in 2 weeks Dialysis M,W,F

## 2018-08-28 DIAGNOSIS — N186 End stage renal disease: Secondary | ICD-10-CM | POA: Diagnosis not present

## 2018-08-28 DIAGNOSIS — Z992 Dependence on renal dialysis: Secondary | ICD-10-CM | POA: Diagnosis not present

## 2018-08-30 DIAGNOSIS — N186 End stage renal disease: Secondary | ICD-10-CM | POA: Diagnosis not present

## 2018-08-30 DIAGNOSIS — J189 Pneumonia, unspecified organism: Secondary | ICD-10-CM | POA: Diagnosis not present

## 2018-08-30 DIAGNOSIS — I5032 Chronic diastolic (congestive) heart failure: Secondary | ICD-10-CM | POA: Diagnosis not present

## 2018-08-30 DIAGNOSIS — Z992 Dependence on renal dialysis: Secondary | ICD-10-CM | POA: Diagnosis not present

## 2018-08-30 DIAGNOSIS — I5021 Acute systolic (congestive) heart failure: Secondary | ICD-10-CM | POA: Diagnosis not present

## 2018-09-02 DIAGNOSIS — Z992 Dependence on renal dialysis: Secondary | ICD-10-CM | POA: Diagnosis not present

## 2018-09-02 DIAGNOSIS — N186 End stage renal disease: Secondary | ICD-10-CM | POA: Diagnosis not present

## 2018-09-02 DIAGNOSIS — D631 Anemia in chronic kidney disease: Secondary | ICD-10-CM | POA: Diagnosis not present

## 2018-09-03 DIAGNOSIS — I4891 Unspecified atrial fibrillation: Secondary | ICD-10-CM | POA: Diagnosis not present

## 2018-09-03 DIAGNOSIS — E785 Hyperlipidemia, unspecified: Secondary | ICD-10-CM | POA: Diagnosis not present

## 2018-09-03 DIAGNOSIS — J810 Acute pulmonary edema: Secondary | ICD-10-CM | POA: Diagnosis not present

## 2018-09-03 DIAGNOSIS — I10 Essential (primary) hypertension: Secondary | ICD-10-CM | POA: Diagnosis not present

## 2018-09-03 DIAGNOSIS — Z6839 Body mass index (BMI) 39.0-39.9, adult: Secondary | ICD-10-CM | POA: Diagnosis not present

## 2018-09-03 DIAGNOSIS — I5032 Chronic diastolic (congestive) heart failure: Secondary | ICD-10-CM | POA: Diagnosis not present

## 2018-09-03 DIAGNOSIS — Z0001 Encounter for general adult medical examination with abnormal findings: Secondary | ICD-10-CM | POA: Diagnosis not present

## 2018-09-03 DIAGNOSIS — Z992 Dependence on renal dialysis: Secondary | ICD-10-CM | POA: Diagnosis not present

## 2018-09-03 DIAGNOSIS — Z1389 Encounter for screening for other disorder: Secondary | ICD-10-CM | POA: Diagnosis not present

## 2018-09-03 DIAGNOSIS — J9600 Acute respiratory failure, unspecified whether with hypoxia or hypercapnia: Secondary | ICD-10-CM | POA: Diagnosis not present

## 2018-09-03 DIAGNOSIS — J189 Pneumonia, unspecified organism: Secondary | ICD-10-CM | POA: Diagnosis not present

## 2018-09-04 DIAGNOSIS — Z992 Dependence on renal dialysis: Secondary | ICD-10-CM | POA: Diagnosis not present

## 2018-09-04 DIAGNOSIS — N186 End stage renal disease: Secondary | ICD-10-CM | POA: Diagnosis not present

## 2018-09-06 DIAGNOSIS — R6511 Systemic inflammatory response syndrome (SIRS) of non-infectious origin with acute organ dysfunction: Secondary | ICD-10-CM | POA: Diagnosis not present

## 2018-09-06 DIAGNOSIS — N186 End stage renal disease: Secondary | ICD-10-CM | POA: Diagnosis not present

## 2018-09-06 DIAGNOSIS — Z992 Dependence on renal dialysis: Secondary | ICD-10-CM | POA: Diagnosis not present

## 2018-09-09 DIAGNOSIS — N186 End stage renal disease: Secondary | ICD-10-CM | POA: Diagnosis not present

## 2018-09-09 DIAGNOSIS — Z992 Dependence on renal dialysis: Secondary | ICD-10-CM | POA: Diagnosis not present

## 2018-09-11 DIAGNOSIS — N186 End stage renal disease: Secondary | ICD-10-CM | POA: Diagnosis not present

## 2018-09-11 DIAGNOSIS — Z992 Dependence on renal dialysis: Secondary | ICD-10-CM | POA: Diagnosis not present

## 2018-09-11 DIAGNOSIS — N2581 Secondary hyperparathyroidism of renal origin: Secondary | ICD-10-CM | POA: Diagnosis not present

## 2018-09-11 LAB — POCT INR: INR: 2.5 (ref 2.0–3.0)

## 2018-09-13 DIAGNOSIS — N186 End stage renal disease: Secondary | ICD-10-CM | POA: Diagnosis not present

## 2018-09-13 DIAGNOSIS — Z992 Dependence on renal dialysis: Secondary | ICD-10-CM | POA: Diagnosis not present

## 2018-09-16 ENCOUNTER — Ambulatory Visit (INDEPENDENT_AMBULATORY_CARE_PROVIDER_SITE_OTHER): Payer: Medicare HMO | Admitting: Pharmacist

## 2018-09-16 DIAGNOSIS — I4891 Unspecified atrial fibrillation: Secondary | ICD-10-CM

## 2018-09-16 DIAGNOSIS — Z5181 Encounter for therapeutic drug level monitoring: Secondary | ICD-10-CM | POA: Diagnosis not present

## 2018-09-16 DIAGNOSIS — N186 End stage renal disease: Secondary | ICD-10-CM | POA: Diagnosis not present

## 2018-09-16 DIAGNOSIS — N2581 Secondary hyperparathyroidism of renal origin: Secondary | ICD-10-CM | POA: Diagnosis not present

## 2018-09-16 DIAGNOSIS — D509 Iron deficiency anemia, unspecified: Secondary | ICD-10-CM | POA: Diagnosis not present

## 2018-09-16 DIAGNOSIS — Z992 Dependence on renal dialysis: Secondary | ICD-10-CM | POA: Diagnosis not present

## 2018-09-16 LAB — POCT INR: INR: 3.3 — AB (ref 2.0–3.0)

## 2018-09-16 NOTE — Patient Instructions (Signed)
Description   Take 1 tablet today, then start taking 1.5 tablets daily except 2 tablets on Mondays. Recheck in 2 weeks Dialysis M,W,F

## 2018-09-18 DIAGNOSIS — N186 End stage renal disease: Secondary | ICD-10-CM | POA: Diagnosis not present

## 2018-09-18 DIAGNOSIS — Z992 Dependence on renal dialysis: Secondary | ICD-10-CM | POA: Diagnosis not present

## 2018-09-20 DIAGNOSIS — N186 End stage renal disease: Secondary | ICD-10-CM | POA: Diagnosis not present

## 2018-09-20 DIAGNOSIS — Z992 Dependence on renal dialysis: Secondary | ICD-10-CM | POA: Diagnosis not present

## 2018-09-23 DIAGNOSIS — N186 End stage renal disease: Secondary | ICD-10-CM | POA: Diagnosis not present

## 2018-09-23 DIAGNOSIS — Z992 Dependence on renal dialysis: Secondary | ICD-10-CM | POA: Diagnosis not present

## 2018-09-25 DIAGNOSIS — Z992 Dependence on renal dialysis: Secondary | ICD-10-CM | POA: Diagnosis not present

## 2018-09-25 DIAGNOSIS — N186 End stage renal disease: Secondary | ICD-10-CM | POA: Diagnosis not present

## 2018-09-27 DIAGNOSIS — Z992 Dependence on renal dialysis: Secondary | ICD-10-CM | POA: Diagnosis not present

## 2018-09-27 DIAGNOSIS — N186 End stage renal disease: Secondary | ICD-10-CM | POA: Diagnosis not present

## 2018-09-30 DIAGNOSIS — Z992 Dependence on renal dialysis: Secondary | ICD-10-CM | POA: Diagnosis not present

## 2018-09-30 DIAGNOSIS — D631 Anemia in chronic kidney disease: Secondary | ICD-10-CM | POA: Diagnosis not present

## 2018-09-30 DIAGNOSIS — N186 End stage renal disease: Secondary | ICD-10-CM | POA: Diagnosis not present

## 2018-10-02 ENCOUNTER — Ambulatory Visit (INDEPENDENT_AMBULATORY_CARE_PROVIDER_SITE_OTHER): Payer: Medicare HMO | Admitting: *Deleted

## 2018-10-02 DIAGNOSIS — I4891 Unspecified atrial fibrillation: Secondary | ICD-10-CM | POA: Diagnosis not present

## 2018-10-02 DIAGNOSIS — N186 End stage renal disease: Secondary | ICD-10-CM | POA: Diagnosis not present

## 2018-10-02 DIAGNOSIS — Z5181 Encounter for therapeutic drug level monitoring: Secondary | ICD-10-CM

## 2018-10-02 DIAGNOSIS — Z992 Dependence on renal dialysis: Secondary | ICD-10-CM | POA: Diagnosis not present

## 2018-10-02 LAB — POCT INR: INR: 2.9 (ref 2.0–3.0)

## 2018-10-02 NOTE — Patient Instructions (Signed)
Continue coumadin 1.5 tablets daily except 2 tablets on Mondays. Recheck in 4 weeks Dialysis M,W,F

## 2018-10-04 DIAGNOSIS — Z992 Dependence on renal dialysis: Secondary | ICD-10-CM | POA: Diagnosis not present

## 2018-10-04 DIAGNOSIS — N186 End stage renal disease: Secondary | ICD-10-CM | POA: Diagnosis not present

## 2018-10-05 DIAGNOSIS — N186 End stage renal disease: Secondary | ICD-10-CM | POA: Diagnosis not present

## 2018-10-05 DIAGNOSIS — Z992 Dependence on renal dialysis: Secondary | ICD-10-CM | POA: Diagnosis not present

## 2018-10-07 DIAGNOSIS — Z992 Dependence on renal dialysis: Secondary | ICD-10-CM | POA: Diagnosis not present

## 2018-10-07 DIAGNOSIS — N186 End stage renal disease: Secondary | ICD-10-CM | POA: Diagnosis not present

## 2018-10-09 DIAGNOSIS — N186 End stage renal disease: Secondary | ICD-10-CM | POA: Diagnosis not present

## 2018-10-09 DIAGNOSIS — Z992 Dependence on renal dialysis: Secondary | ICD-10-CM | POA: Diagnosis not present

## 2018-10-11 DIAGNOSIS — N186 End stage renal disease: Secondary | ICD-10-CM | POA: Diagnosis not present

## 2018-10-11 DIAGNOSIS — Z992 Dependence on renal dialysis: Secondary | ICD-10-CM | POA: Diagnosis not present

## 2018-10-13 NOTE — Progress Notes (Signed)
Cardiology Office Note   Date:  10/15/2018   ID:  Sabrina Beck Nov 02, 1940, MRN [id number]  PCP:  Redmond School, MD  Cardiologist: Croitoru  Chief Complaint  Patient presents with  . Atrial Fibrillation  . Hospitalization Follow-up  . Congestive Heart Failure     History of Present Illness: Sabrina Beck is a 78 y.o. female who presents for ongoing assessment and management of permanent atrial fibrillation on coumadin, chronic diastolic CHF, HTN, with hx of ESRD on hemodialysis. She was seen on consultation jon 08/23/2018 by Dr. Domenic Polite while admitted for acute hypoxic respiratory failure in the setting of pneumonia with an associated component of pulmonary edema.   Echo was completed during hospitalization revealing LVEF of 45% mid inferoseptal hypokinesis, indeterminate diastolic function, mild mitral regurgitation with moderately dilated left atrium, PASP 21 mmHg. This was reduced compared to previous echo in 2015 with EF of 50%-55%.  She was diagnosed with secondary cardiomyopathy and atrial fib with RVR. Diltiazem was discontinued due to decreased EF and metoprolol was titrated up to 200 mg BID, help with HR control. She was given emergent dialysis. She was sent home on O2 via [place]. She was discharged on 08/24/2018.  She comes today with complaints of her BP dropping while she is in dialysis and taking a long time to regulate thereafter. She has been placed on higher dose of metoprolol while in the hospital for pneumonia, pulmonary edema and Afib with RVR. Now she is feeling more tired.   Past Medical History:  Diagnosis Date  . Anemia   . Atrial fibrillation ([place])   . Chest pain 05/15/2006   Stress test negative for ischemia  . Diastolic heart failure ([place])   . Diverticulosis   . ESRD on hemodialysis ([place])   . GERD (gastroesophageal reflux disease)   . Headache   . Mixed hyperlipidemia   . Peripheral neuropathy   . PONV (postoperative nausea and vomiting)   . Systemic  hypertension   . Uterine cancer [facility])     Past Surgical History:  Procedure Laterality Date  . ABDOMINAL HYSTERECTOMY    . APPENDECTOMY    . AV FISTULA PLACEMENT Left 11/17/2013   Procedure: CREATION OF LEFT RADIOCEPHALIC ARTERIOVENOUS (AV) FISTULA ;  Surgeon: [name], MD;  Location: [place];  Service: Vascular;  Laterality: Left;  . BREAST SURGERY Left    tumors non canerous 2- 3 different surgeries  . CATARACT EXTRACTION W/PHACO Right 10/26/2016   Procedure: CATARACT EXTRACTION PHACO AND INTRAOCULAR LENS PLACEMENT (IOC) CDE= 11.91;  Surgeon: Tonny Branch, MD;  Location: [facility];  Service: Ophthalmology;  Laterality: Right;  right - pt knows to arrive at 6:30  . CATARACT EXTRACTION W/PHACO Left 11/30/2016   Procedure: CATARACT EXTRACTION PHACO AND INTRAOCULAR LENS PLACEMENT (IOC) CDE - 7.92 ;  Surgeon: Tonny Branch, MD;  Location: [facility];  Service: Ophthalmology;  Laterality: Left;  left  . CHOLECYSTECTOMY    . COLONOSCOPY W/ BIOPSIES AND POLYPECTOMY    . FISTULA SUPERFICIALIZATION Left 05/19/2014   Procedure: FISTULA SUPERFICIALIZATION-LEFT ARM;  Surgeon: [name], MD;  Location: [place];  Service: Vascular;  Laterality: Left;  . INSERTION OF DIALYSIS CATHETER N/A 01/24/2014   Procedure: INSERTION OF DIALYSIS CATHETER;  Surgeon: Rosetta Posner, MD;  Location: [facility] OR;  Service: Vascular;  Laterality: N/A;  . KNEE ARTHROSCOPY    . TUBAL LIGATION       Current Outpatient Medications  Medication Sig Dispense Refill  . acetaminophen (TYLENOL)  500 MG tablet Take 1,000 mg by mouth every 6 (six) hours as needed for moderate pain.     . Cholecalciferol (VITAMIN D) 2000 UNITS CAPS Take 2,000 Units by mouth daily.     . metoprolol tartrate (LOPRESSOR) 50 MG tablet Take 1 tablet (50 mg total) by mouth 2 (two) times daily. 60 tablet 6  . Multiple Vitamins-Minerals (PRESERVISION AREDS 2 PO) Take 1 tablet by mouth 2 (two) times daily.    . multivitamin (RENA-VIT) TABS tablet Take by mouth.    .  Omega-3 Fatty Acids (OMEGA-3 FISH OIL) 300 MG CAPS Take 300 mg by mouth 2 (two) times daily.    Marland Kitchen omeprazole (PRILOSEC) 20 MG capsule Take 20 mg by mouth daily.    Marland Kitchen Propylene Glycol (SYSTANE BALANCE) 0.6 % SOLN Place 1 drop into both eyes 3 (three) times daily as needed (for burning or dry eyes).     . RENVELA 800 MG tablet Take 800-1,600 mg by mouth See admin instructions. Takes 1 tablet with snacks and 2 tablets with meals    . warfarin (COUMADIN) 5 MG tablet Take 1.5 to 2 tablets by mouth daily as directed by coumadin clinic 150 tablet 1  . calcium acetate (PHOSLO) 667 MG capsule as needed. 2 with meals and 1 with snacks    . gabapentin (NEURONTIN) 100 MG capsule 100 mg daily.     No current facility-administered medications for this visit.     Allergies:   Patient has no known allergies.    Social History:  The patient  reports that she quit smoking about 19 years ago. She has never used smokeless tobacco. She reports that she does not drink alcohol or use drugs.   Family History:  The patient's family history includes Cancer in her brother; Diabetes in her brother, father, mother, and sister; Heart attack in her brother and father; Heart disease in her brother and father; Hyperlipidemia in her sister; Hypertension in her brother, mother, and sister.    ROS: All other systems are reviewed and negative. Unless otherwise mentioned in H&P    PHYSICAL EXAM: VS:  BP 122/78   Pulse 94   Ht 5\' 4"  (1.626 m)   Wt 220 lb (99.8 kg)   BMI 37.76 kg/m  , BMI Body mass index is 37.76 kg/m. GEN: Well nourished, well developed, in no acute distress HEENT: normal Neck: no JVD, carotid bruits, or masses Cardiac: RRR; no murmurs, rubs, or gallops,no edema  Respiratory:  Clear to auscultation bilaterally, normal work of breathing GI: soft, nontender, nondistended, + BS MS: no deformity or atrophy, dialysis shunt to the left forearm with good thrill Skin: warm and dry, no rash Neuro:  Strength  and sensation are intact Psych: euthymic mood, full affect   EKG:  Not competed this office visit.   Recent Labs: 10/17/2017: TSH 2.550 08/19/2018: ALT 31 08/23/2018: BUN 69; Creatinine, Ser 7.82; Hemoglobin 8.8; Platelets 332; Potassium 4.0; Sodium 137    Lipid Panel    Component Value Date/Time   CHOL 87 (L) 06/08/2015 0735   TRIG 272 (H) 06/08/2015 0735   HDL 25 (L) 06/08/2015 0735   LDLCALC 8 06/08/2015 0735      Wt Readings from Last 3 Encounters:  10/15/18 220 lb (99.8 kg)  08/24/18 210 lb 1.6 oz (95.3 kg)  08/15/18 222 lb 6.4 oz (100.9 kg)      Other studies Reviewed: Left ventricle: The cavity size was normal. Wall thickness was increased in a pattern of  moderate LVH. The estimated ejection fraction was 45%. There is hypokinesis of the midinferoseptal myocardium. The study was not technically sufficient to allow evaluation of LV diastolic dysfunction due to atrial fibrillation. - Aortic valve: Mildly calcified annulus. Trileaflet; mildly calcified leaflets. - Mitral valve: Mildly calcified annulus. There was mild regurgitation. - Left atrium: The atrium was moderately dilated. - Right atrium: The atrium was mildly to moderately dilated. - Atrial septum: No defect or patent foramen ovale was identified. - Tricuspid valve: There was mild regurgitation. - Pulmonary arteries: PA peak pressure: 21 mm Hg (S). - Pericardium, extracardiac: There was no pericardial effusion.  ASSESSMENT AND PLAN:  1.Atrial fib: She is currently rate controlled. We will cautiously decrease her metoprolol back to 50 mg BID as she was able to handle this better concerning her BP and HR prior to admission for acute illness. She is having some hypotension during dialysis. She brings with her the recordings from the dialysis center. BP drops into the 86'V systolic. We will see her in a month for follow up. Continue coumadin per clinic in [place] office.  2. Chronic Mixed CHF:  Last echo revealed EF of 45%. She is to continue on current regimen for now. Close follow up. No evidence of volume overload   3. ESRD: She will hold metoprolol dose in the mornings of dialysis. If BP or HR rises when she gets home, she will take 25 mg dose and continue 50 mg dose in the evening.      Current medicines are reviewed at length with the patient today.    Labs/ tests ordered today include: None  Phill Myron. West Pugh, ANP, AACC   10/15/2018 3:59 PM    [place] [address] [phone] Fax [phone]

## 2018-10-14 DIAGNOSIS — D509 Iron deficiency anemia, unspecified: Secondary | ICD-10-CM | POA: Diagnosis not present

## 2018-10-14 DIAGNOSIS — Z992 Dependence on renal dialysis: Secondary | ICD-10-CM | POA: Diagnosis not present

## 2018-10-14 DIAGNOSIS — N2581 Secondary hyperparathyroidism of renal origin: Secondary | ICD-10-CM | POA: Diagnosis not present

## 2018-10-14 DIAGNOSIS — N186 End stage renal disease: Secondary | ICD-10-CM | POA: Diagnosis not present

## 2018-10-15 ENCOUNTER — Other Ambulatory Visit: Payer: Self-pay

## 2018-10-15 ENCOUNTER — Ambulatory Visit: Payer: Medicare HMO | Admitting: Adult Health

## 2018-10-15 ENCOUNTER — Encounter: Payer: Self-pay | Admitting: Adult Health

## 2018-10-15 VITALS — BP 122/78 | HR 94 | Ht 64.0 in | Wt 220.0 lb

## 2018-10-15 DIAGNOSIS — N186 End stage renal disease: Secondary | ICD-10-CM

## 2018-10-15 DIAGNOSIS — I5042 Chronic combined systolic (congestive) and diastolic (congestive) heart failure: Secondary | ICD-10-CM

## 2018-10-15 DIAGNOSIS — I4821 Permanent atrial fibrillation: Secondary | ICD-10-CM

## 2018-10-15 MED ORDER — METOPROLOL TARTRATE 50 MG PO TABS: 50.0000 mg | ORAL_TABLET | Freq: Two times a day (BID) | ORAL | 6 refills | Status: DC

## 2018-10-15 MED ORDER — WARFARIN SODIUM 5 MG PO TABS: ORAL_TABLET | ORAL | 1 refills | Status: DC

## 2018-10-15 NOTE — Patient Instructions (Signed)
Medication Instructions:  DECREASE METOPROLOL TART 50MG  TWICE DAILY If you need a refill on your cardiac medications before your next appointment, please call your pharmacy.  Follow-Up: You will need a follow up appointment in 1 months AT [place].  You may see Sanda Klein, MD, Jory Sims, DNP, AACC  or one of the following Advanced Practice Providers on your designated Care Team:  Almyra Deforest, PA-C  Fabian Sharp, PA-C      At [facility], you and your health needs are our priority.  As part of our continuing mission to provide you with exceptional heart care, we have created designated Provider Care Teams.  These Care Teams include your primary Cardiologist (physician) and Advanced Practice Providers (APPs -  Physician Assistants and Nurse Practitioners) who all work together to provide you with the care you need, when you need it.  Thank you for choosing [facility] at [facility]!!

## 2018-10-16 DIAGNOSIS — Z992 Dependence on renal dialysis: Secondary | ICD-10-CM | POA: Diagnosis not present

## 2018-10-16 DIAGNOSIS — N186 End stage renal disease: Secondary | ICD-10-CM | POA: Diagnosis not present

## 2018-10-17 NOTE — Progress Notes (Signed)
Another option is to Rx a lower dose of beta blocker on HD days compared to non HD days. MCr

## 2018-10-18 DIAGNOSIS — N186 End stage renal disease: Secondary | ICD-10-CM | POA: Diagnosis not present

## 2018-10-18 DIAGNOSIS — Z992 Dependence on renal dialysis: Secondary | ICD-10-CM | POA: Diagnosis not present

## 2018-10-21 DIAGNOSIS — N186 End stage renal disease: Secondary | ICD-10-CM | POA: Diagnosis not present

## 2018-10-21 DIAGNOSIS — Z992 Dependence on renal dialysis: Secondary | ICD-10-CM | POA: Diagnosis not present

## 2018-10-23 DIAGNOSIS — Z992 Dependence on renal dialysis: Secondary | ICD-10-CM | POA: Diagnosis not present

## 2018-10-23 DIAGNOSIS — N186 End stage renal disease: Secondary | ICD-10-CM | POA: Diagnosis not present

## 2018-10-25 DIAGNOSIS — N186 End stage renal disease: Secondary | ICD-10-CM | POA: Diagnosis not present

## 2018-10-25 DIAGNOSIS — Z992 Dependence on renal dialysis: Secondary | ICD-10-CM | POA: Diagnosis not present

## 2018-10-28 DIAGNOSIS — N2581 Secondary hyperparathyroidism of renal origin: Secondary | ICD-10-CM | POA: Diagnosis not present

## 2018-10-28 DIAGNOSIS — Z992 Dependence on renal dialysis: Secondary | ICD-10-CM | POA: Diagnosis not present

## 2018-10-28 DIAGNOSIS — D631 Anemia in chronic kidney disease: Secondary | ICD-10-CM | POA: Diagnosis not present

## 2018-10-28 DIAGNOSIS — N186 End stage renal disease: Secondary | ICD-10-CM | POA: Diagnosis not present

## 2018-10-29 ENCOUNTER — Telehealth: Payer: Self-pay | Admitting: Pharmacist

## 2018-10-29 NOTE — Telephone Encounter (Signed)

## 2018-10-30 DIAGNOSIS — N186 End stage renal disease: Secondary | ICD-10-CM | POA: Diagnosis not present

## 2018-10-30 DIAGNOSIS — Z992 Dependence on renal dialysis: Secondary | ICD-10-CM | POA: Diagnosis not present

## 2018-10-31 ENCOUNTER — Ambulatory Visit (INDEPENDENT_AMBULATORY_CARE_PROVIDER_SITE_OTHER): Payer: Medicare HMO | Admitting: *Deleted

## 2018-10-31 DIAGNOSIS — Z5181 Encounter for therapeutic drug level monitoring: Secondary | ICD-10-CM | POA: Diagnosis not present

## 2018-10-31 DIAGNOSIS — I4891 Unspecified atrial fibrillation: Secondary | ICD-10-CM

## 2018-10-31 LAB — POCT INR: INR: 2.4 (ref 2.0–3.0)

## 2018-10-31 NOTE — Patient Instructions (Signed)
Continue coumadin 1.5 tablets daily except 2 tablets on Mondays. Recheck in 6 weeks Dialysis M,W,F

## 2018-11-01 DIAGNOSIS — Z992 Dependence on renal dialysis: Secondary | ICD-10-CM | POA: Diagnosis not present

## 2018-11-01 DIAGNOSIS — N186 End stage renal disease: Secondary | ICD-10-CM | POA: Diagnosis not present

## 2018-11-04 DIAGNOSIS — N186 End stage renal disease: Secondary | ICD-10-CM | POA: Diagnosis not present

## 2018-11-04 DIAGNOSIS — Z992 Dependence on renal dialysis: Secondary | ICD-10-CM | POA: Diagnosis not present

## 2018-11-05 DIAGNOSIS — N186 End stage renal disease: Secondary | ICD-10-CM | POA: Diagnosis not present

## 2018-11-05 DIAGNOSIS — Z992 Dependence on renal dialysis: Secondary | ICD-10-CM | POA: Diagnosis not present

## 2018-11-06 DIAGNOSIS — Z992 Dependence on renal dialysis: Secondary | ICD-10-CM | POA: Diagnosis not present

## 2018-11-06 DIAGNOSIS — N186 End stage renal disease: Secondary | ICD-10-CM | POA: Diagnosis not present

## 2018-11-08 DIAGNOSIS — N186 End stage renal disease: Secondary | ICD-10-CM | POA: Diagnosis not present

## 2018-11-08 DIAGNOSIS — Z992 Dependence on renal dialysis: Secondary | ICD-10-CM | POA: Diagnosis not present

## 2018-11-11 DIAGNOSIS — Z992 Dependence on renal dialysis: Secondary | ICD-10-CM | POA: Diagnosis not present

## 2018-11-11 DIAGNOSIS — N2581 Secondary hyperparathyroidism of renal origin: Secondary | ICD-10-CM | POA: Diagnosis not present

## 2018-11-11 DIAGNOSIS — N186 End stage renal disease: Secondary | ICD-10-CM | POA: Diagnosis not present

## 2018-11-12 ENCOUNTER — Telehealth: Payer: Self-pay

## 2018-11-12 NOTE — Telephone Encounter (Signed)
PT STATES THAT SHE DOES NOT NEED APPT D/T BP RUNNING BEFORE DIALYUSIS 140/65 TO AFTER DIALYSIS 100/70'S SHE STATES THAT SHE WILL CB OR GO TO THE ER IF NEEDED

## 2018-11-12 NOTE — Telephone Encounter (Signed)
[id number]-appt mon 4-13-telephone or video visit?

## 2018-11-13 DIAGNOSIS — N186 End stage renal disease: Secondary | ICD-10-CM | POA: Diagnosis not present

## 2018-11-13 DIAGNOSIS — Z992 Dependence on renal dialysis: Secondary | ICD-10-CM | POA: Diagnosis not present

## 2018-11-15 DIAGNOSIS — N186 End stage renal disease: Secondary | ICD-10-CM | POA: Diagnosis not present

## 2018-11-15 DIAGNOSIS — Z992 Dependence on renal dialysis: Secondary | ICD-10-CM | POA: Diagnosis not present

## 2018-11-18 ENCOUNTER — Ambulatory Visit: Payer: Medicare HMO | Admitting: Adult Health

## 2018-11-18 DIAGNOSIS — Z992 Dependence on renal dialysis: Secondary | ICD-10-CM | POA: Diagnosis not present

## 2018-11-18 DIAGNOSIS — N2581 Secondary hyperparathyroidism of renal origin: Secondary | ICD-10-CM | POA: Diagnosis not present

## 2018-11-18 DIAGNOSIS — N186 End stage renal disease: Secondary | ICD-10-CM | POA: Diagnosis not present

## 2018-11-18 DIAGNOSIS — D509 Iron deficiency anemia, unspecified: Secondary | ICD-10-CM | POA: Diagnosis not present

## 2018-11-20 DIAGNOSIS — Z992 Dependence on renal dialysis: Secondary | ICD-10-CM | POA: Diagnosis not present

## 2018-11-20 DIAGNOSIS — N186 End stage renal disease: Secondary | ICD-10-CM | POA: Diagnosis not present

## 2018-11-22 DIAGNOSIS — N186 End stage renal disease: Secondary | ICD-10-CM | POA: Diagnosis not present

## 2018-11-22 DIAGNOSIS — Z992 Dependence on renal dialysis: Secondary | ICD-10-CM | POA: Diagnosis not present

## 2018-11-25 DIAGNOSIS — Z992 Dependence on renal dialysis: Secondary | ICD-10-CM | POA: Diagnosis not present

## 2018-11-25 DIAGNOSIS — N186 End stage renal disease: Secondary | ICD-10-CM | POA: Diagnosis not present

## 2018-11-27 DIAGNOSIS — N186 End stage renal disease: Secondary | ICD-10-CM | POA: Diagnosis not present

## 2018-11-27 DIAGNOSIS — Z992 Dependence on renal dialysis: Secondary | ICD-10-CM | POA: Diagnosis not present

## 2018-11-29 DIAGNOSIS — N186 End stage renal disease: Secondary | ICD-10-CM | POA: Diagnosis not present

## 2018-11-29 DIAGNOSIS — Z992 Dependence on renal dialysis: Secondary | ICD-10-CM | POA: Diagnosis not present

## 2018-12-02 DIAGNOSIS — N186 End stage renal disease: Secondary | ICD-10-CM | POA: Diagnosis not present

## 2018-12-02 DIAGNOSIS — Z992 Dependence on renal dialysis: Secondary | ICD-10-CM | POA: Diagnosis not present

## 2018-12-02 DIAGNOSIS — D631 Anemia in chronic kidney disease: Secondary | ICD-10-CM | POA: Diagnosis not present

## 2018-12-02 DIAGNOSIS — N2581 Secondary hyperparathyroidism of renal origin: Secondary | ICD-10-CM | POA: Diagnosis not present

## 2018-12-04 DIAGNOSIS — Z992 Dependence on renal dialysis: Secondary | ICD-10-CM | POA: Diagnosis not present

## 2018-12-04 DIAGNOSIS — N186 End stage renal disease: Secondary | ICD-10-CM | POA: Diagnosis not present

## 2018-12-05 DIAGNOSIS — N186 End stage renal disease: Secondary | ICD-10-CM | POA: Diagnosis not present

## 2018-12-05 DIAGNOSIS — Z992 Dependence on renal dialysis: Secondary | ICD-10-CM | POA: Diagnosis not present

## 2018-12-06 DIAGNOSIS — Z992 Dependence on renal dialysis: Secondary | ICD-10-CM | POA: Diagnosis not present

## 2018-12-06 DIAGNOSIS — N186 End stage renal disease: Secondary | ICD-10-CM | POA: Diagnosis not present

## 2018-12-09 DIAGNOSIS — Z992 Dependence on renal dialysis: Secondary | ICD-10-CM | POA: Diagnosis not present

## 2018-12-09 DIAGNOSIS — N186 End stage renal disease: Secondary | ICD-10-CM | POA: Diagnosis not present

## 2018-12-11 ENCOUNTER — Telehealth: Payer: Self-pay | Admitting: *Deleted

## 2018-12-11 DIAGNOSIS — N186 End stage renal disease: Secondary | ICD-10-CM | POA: Diagnosis not present

## 2018-12-11 DIAGNOSIS — Z992 Dependence on renal dialysis: Secondary | ICD-10-CM | POA: Diagnosis not present

## 2018-12-11 NOTE — Telephone Encounter (Signed)
° ° ° °  COVID-19 Pre-Screening Questions:   Do you currently have a fever? No    Have you recently travelled on a cruise, internationally, or to [place], [place], [place], [place], [place], or [place], [place] [employer]) ?NO    Have you been in contact with someone that is currently pending confirmation of Covid19 testing or has been confirmed to have the [place] virus?  NO   Are you currently experiencing fatigue or cough? NO

## 2018-12-12 ENCOUNTER — Ambulatory Visit (INDEPENDENT_AMBULATORY_CARE_PROVIDER_SITE_OTHER): Payer: Medicare HMO | Admitting: *Deleted

## 2018-12-12 ENCOUNTER — Other Ambulatory Visit: Payer: Self-pay

## 2018-12-12 DIAGNOSIS — I4891 Unspecified atrial fibrillation: Secondary | ICD-10-CM

## 2018-12-12 DIAGNOSIS — Z5181 Encounter for therapeutic drug level monitoring: Secondary | ICD-10-CM | POA: Diagnosis not present

## 2018-12-12 LAB — POCT INR: INR: 2.6 (ref 2.0–3.0)

## 2018-12-12 NOTE — Patient Instructions (Signed)
Continue coumadin 1.5 tablets daily except 2 tablets on Mondays. Recheck in 6 weeks Dialysis M,W,F

## 2018-12-13 DIAGNOSIS — Z992 Dependence on renal dialysis: Secondary | ICD-10-CM | POA: Diagnosis not present

## 2018-12-13 DIAGNOSIS — N186 End stage renal disease: Secondary | ICD-10-CM | POA: Diagnosis not present

## 2018-12-16 DIAGNOSIS — Z992 Dependence on renal dialysis: Secondary | ICD-10-CM | POA: Diagnosis not present

## 2018-12-16 DIAGNOSIS — N186 End stage renal disease: Secondary | ICD-10-CM | POA: Diagnosis not present

## 2018-12-16 DIAGNOSIS — N2581 Secondary hyperparathyroidism of renal origin: Secondary | ICD-10-CM | POA: Diagnosis not present

## 2018-12-16 DIAGNOSIS — D509 Iron deficiency anemia, unspecified: Secondary | ICD-10-CM | POA: Diagnosis not present

## 2018-12-18 DIAGNOSIS — N186 End stage renal disease: Secondary | ICD-10-CM | POA: Diagnosis not present

## 2018-12-18 DIAGNOSIS — Z992 Dependence on renal dialysis: Secondary | ICD-10-CM | POA: Diagnosis not present

## 2018-12-20 DIAGNOSIS — N186 End stage renal disease: Secondary | ICD-10-CM | POA: Diagnosis not present

## 2018-12-20 DIAGNOSIS — Z992 Dependence on renal dialysis: Secondary | ICD-10-CM | POA: Diagnosis not present

## 2018-12-23 DIAGNOSIS — Z992 Dependence on renal dialysis: Secondary | ICD-10-CM | POA: Diagnosis not present

## 2018-12-23 DIAGNOSIS — D631 Anemia in chronic kidney disease: Secondary | ICD-10-CM | POA: Diagnosis not present

## 2018-12-23 DIAGNOSIS — N186 End stage renal disease: Secondary | ICD-10-CM | POA: Diagnosis not present

## 2018-12-25 DIAGNOSIS — N186 End stage renal disease: Secondary | ICD-10-CM | POA: Diagnosis not present

## 2018-12-25 DIAGNOSIS — Z992 Dependence on renal dialysis: Secondary | ICD-10-CM | POA: Diagnosis not present

## 2018-12-27 DIAGNOSIS — Z992 Dependence on renal dialysis: Secondary | ICD-10-CM | POA: Diagnosis not present

## 2018-12-27 DIAGNOSIS — N186 End stage renal disease: Secondary | ICD-10-CM | POA: Diagnosis not present

## 2018-12-30 DIAGNOSIS — N186 End stage renal disease: Secondary | ICD-10-CM | POA: Diagnosis not present

## 2018-12-30 DIAGNOSIS — Z992 Dependence on renal dialysis: Secondary | ICD-10-CM | POA: Diagnosis not present

## 2019-01-01 DIAGNOSIS — Z992 Dependence on renal dialysis: Secondary | ICD-10-CM | POA: Diagnosis not present

## 2019-01-01 DIAGNOSIS — N2581 Secondary hyperparathyroidism of renal origin: Secondary | ICD-10-CM | POA: Diagnosis not present

## 2019-01-01 DIAGNOSIS — N186 End stage renal disease: Secondary | ICD-10-CM | POA: Diagnosis not present

## 2019-01-03 DIAGNOSIS — Z992 Dependence on renal dialysis: Secondary | ICD-10-CM | POA: Diagnosis not present

## 2019-01-03 DIAGNOSIS — N186 End stage renal disease: Secondary | ICD-10-CM | POA: Diagnosis not present

## 2019-01-05 DIAGNOSIS — Z992 Dependence on renal dialysis: Secondary | ICD-10-CM | POA: Diagnosis not present

## 2019-01-05 DIAGNOSIS — N186 End stage renal disease: Secondary | ICD-10-CM | POA: Diagnosis not present

## 2019-01-06 DIAGNOSIS — N186 End stage renal disease: Secondary | ICD-10-CM | POA: Diagnosis not present

## 2019-01-06 DIAGNOSIS — Z992 Dependence on renal dialysis: Secondary | ICD-10-CM | POA: Diagnosis not present

## 2019-01-08 DIAGNOSIS — Z992 Dependence on renal dialysis: Secondary | ICD-10-CM | POA: Diagnosis not present

## 2019-01-08 DIAGNOSIS — N186 End stage renal disease: Secondary | ICD-10-CM | POA: Diagnosis not present

## 2019-01-10 DIAGNOSIS — Z992 Dependence on renal dialysis: Secondary | ICD-10-CM | POA: Diagnosis not present

## 2019-01-10 DIAGNOSIS — N186 End stage renal disease: Secondary | ICD-10-CM | POA: Diagnosis not present

## 2019-01-13 DIAGNOSIS — N186 End stage renal disease: Secondary | ICD-10-CM | POA: Diagnosis not present

## 2019-01-13 DIAGNOSIS — Z992 Dependence on renal dialysis: Secondary | ICD-10-CM | POA: Diagnosis not present

## 2019-01-13 DIAGNOSIS — D509 Iron deficiency anemia, unspecified: Secondary | ICD-10-CM | POA: Diagnosis not present

## 2019-01-13 DIAGNOSIS — N2581 Secondary hyperparathyroidism of renal origin: Secondary | ICD-10-CM | POA: Diagnosis not present

## 2019-01-15 DIAGNOSIS — Z992 Dependence on renal dialysis: Secondary | ICD-10-CM | POA: Diagnosis not present

## 2019-01-15 DIAGNOSIS — N186 End stage renal disease: Secondary | ICD-10-CM | POA: Diagnosis not present

## 2019-01-17 DIAGNOSIS — N186 End stage renal disease: Secondary | ICD-10-CM | POA: Diagnosis not present

## 2019-01-17 DIAGNOSIS — Z992 Dependence on renal dialysis: Secondary | ICD-10-CM | POA: Diagnosis not present

## 2019-01-20 DIAGNOSIS — N186 End stage renal disease: Secondary | ICD-10-CM | POA: Diagnosis not present

## 2019-01-20 DIAGNOSIS — Z992 Dependence on renal dialysis: Secondary | ICD-10-CM | POA: Diagnosis not present

## 2019-01-22 ENCOUNTER — Telehealth: Payer: Self-pay | Admitting: *Deleted

## 2019-01-22 DIAGNOSIS — Z992 Dependence on renal dialysis: Secondary | ICD-10-CM | POA: Diagnosis not present

## 2019-01-22 DIAGNOSIS — N186 End stage renal disease: Secondary | ICD-10-CM | POA: Diagnosis not present

## 2019-01-22 NOTE — Telephone Encounter (Signed)

## 2019-01-23 ENCOUNTER — Ambulatory Visit (INDEPENDENT_AMBULATORY_CARE_PROVIDER_SITE_OTHER): Payer: Medicare HMO | Admitting: *Deleted

## 2019-01-23 DIAGNOSIS — I4891 Unspecified atrial fibrillation: Secondary | ICD-10-CM

## 2019-01-23 DIAGNOSIS — Z5181 Encounter for therapeutic drug level monitoring: Secondary | ICD-10-CM

## 2019-01-23 LAB — POCT INR: INR: 2.5 (ref 2.0–3.0)

## 2019-01-23 NOTE — Patient Instructions (Signed)
Continue coumadin 1.5 tablets daily except 2 tablets on Mondays. Recheck in 6 weeks Dialysis M,W,F

## 2019-01-24 DIAGNOSIS — Z992 Dependence on renal dialysis: Secondary | ICD-10-CM | POA: Diagnosis not present

## 2019-01-24 DIAGNOSIS — N186 End stage renal disease: Secondary | ICD-10-CM | POA: Diagnosis not present

## 2019-01-27 DIAGNOSIS — N186 End stage renal disease: Secondary | ICD-10-CM | POA: Diagnosis not present

## 2019-01-27 DIAGNOSIS — Z992 Dependence on renal dialysis: Secondary | ICD-10-CM | POA: Diagnosis not present

## 2019-01-27 DIAGNOSIS — N2581 Secondary hyperparathyroidism of renal origin: Secondary | ICD-10-CM | POA: Diagnosis not present

## 2019-01-27 DIAGNOSIS — D631 Anemia in chronic kidney disease: Secondary | ICD-10-CM | POA: Diagnosis not present

## 2019-01-29 DIAGNOSIS — Z992 Dependence on renal dialysis: Secondary | ICD-10-CM | POA: Diagnosis not present

## 2019-01-29 DIAGNOSIS — N186 End stage renal disease: Secondary | ICD-10-CM | POA: Diagnosis not present

## 2019-01-31 DIAGNOSIS — N186 End stage renal disease: Secondary | ICD-10-CM | POA: Diagnosis not present

## 2019-01-31 DIAGNOSIS — Z992 Dependence on renal dialysis: Secondary | ICD-10-CM | POA: Diagnosis not present

## 2019-02-03 DIAGNOSIS — N186 End stage renal disease: Secondary | ICD-10-CM | POA: Diagnosis not present

## 2019-02-03 DIAGNOSIS — Z992 Dependence on renal dialysis: Secondary | ICD-10-CM | POA: Diagnosis not present

## 2019-02-04 DIAGNOSIS — N186 End stage renal disease: Secondary | ICD-10-CM | POA: Diagnosis not present

## 2019-02-04 DIAGNOSIS — Z992 Dependence on renal dialysis: Secondary | ICD-10-CM | POA: Diagnosis not present

## 2019-02-05 DIAGNOSIS — N186 End stage renal disease: Secondary | ICD-10-CM | POA: Diagnosis not present

## 2019-02-05 DIAGNOSIS — Z992 Dependence on renal dialysis: Secondary | ICD-10-CM | POA: Diagnosis not present

## 2019-02-07 DIAGNOSIS — Z992 Dependence on renal dialysis: Secondary | ICD-10-CM | POA: Diagnosis not present

## 2019-02-07 DIAGNOSIS — N186 End stage renal disease: Secondary | ICD-10-CM | POA: Diagnosis not present

## 2019-02-10 DIAGNOSIS — N186 End stage renal disease: Secondary | ICD-10-CM | POA: Diagnosis not present

## 2019-02-10 DIAGNOSIS — Z992 Dependence on renal dialysis: Secondary | ICD-10-CM | POA: Diagnosis not present

## 2019-02-10 DIAGNOSIS — N2581 Secondary hyperparathyroidism of renal origin: Secondary | ICD-10-CM | POA: Diagnosis not present

## 2019-02-12 DIAGNOSIS — Z992 Dependence on renal dialysis: Secondary | ICD-10-CM | POA: Diagnosis not present

## 2019-02-12 DIAGNOSIS — N186 End stage renal disease: Secondary | ICD-10-CM | POA: Diagnosis not present

## 2019-02-14 DIAGNOSIS — Z992 Dependence on renal dialysis: Secondary | ICD-10-CM | POA: Diagnosis not present

## 2019-02-14 DIAGNOSIS — N186 End stage renal disease: Secondary | ICD-10-CM | POA: Diagnosis not present

## 2019-02-17 DIAGNOSIS — N186 End stage renal disease: Secondary | ICD-10-CM | POA: Diagnosis not present

## 2019-02-17 DIAGNOSIS — D509 Iron deficiency anemia, unspecified: Secondary | ICD-10-CM | POA: Diagnosis not present

## 2019-02-17 DIAGNOSIS — N2581 Secondary hyperparathyroidism of renal origin: Secondary | ICD-10-CM | POA: Diagnosis not present

## 2019-02-17 DIAGNOSIS — Z992 Dependence on renal dialysis: Secondary | ICD-10-CM | POA: Diagnosis not present

## 2019-02-19 DIAGNOSIS — Z992 Dependence on renal dialysis: Secondary | ICD-10-CM | POA: Diagnosis not present

## 2019-02-19 DIAGNOSIS — N186 End stage renal disease: Secondary | ICD-10-CM | POA: Diagnosis not present

## 2019-02-21 DIAGNOSIS — Z992 Dependence on renal dialysis: Secondary | ICD-10-CM | POA: Diagnosis not present

## 2019-02-21 DIAGNOSIS — N186 End stage renal disease: Secondary | ICD-10-CM | POA: Diagnosis not present

## 2019-02-24 DIAGNOSIS — Z992 Dependence on renal dialysis: Secondary | ICD-10-CM | POA: Diagnosis not present

## 2019-02-24 DIAGNOSIS — N186 End stage renal disease: Secondary | ICD-10-CM | POA: Diagnosis not present

## 2019-02-26 DIAGNOSIS — Z992 Dependence on renal dialysis: Secondary | ICD-10-CM | POA: Diagnosis not present

## 2019-02-26 DIAGNOSIS — N186 End stage renal disease: Secondary | ICD-10-CM | POA: Diagnosis not present

## 2019-02-28 DIAGNOSIS — Z992 Dependence on renal dialysis: Secondary | ICD-10-CM | POA: Diagnosis not present

## 2019-02-28 DIAGNOSIS — N186 End stage renal disease: Secondary | ICD-10-CM | POA: Diagnosis not present

## 2019-03-03 DIAGNOSIS — N186 End stage renal disease: Secondary | ICD-10-CM | POA: Diagnosis not present

## 2019-03-03 DIAGNOSIS — N2581 Secondary hyperparathyroidism of renal origin: Secondary | ICD-10-CM | POA: Diagnosis not present

## 2019-03-03 DIAGNOSIS — Z992 Dependence on renal dialysis: Secondary | ICD-10-CM | POA: Diagnosis not present

## 2019-03-03 DIAGNOSIS — H5210 Myopia, unspecified eye: Secondary | ICD-10-CM | POA: Diagnosis not present

## 2019-03-03 DIAGNOSIS — D631 Anemia in chronic kidney disease: Secondary | ICD-10-CM | POA: Diagnosis not present

## 2019-03-05 DIAGNOSIS — Z992 Dependence on renal dialysis: Secondary | ICD-10-CM | POA: Diagnosis not present

## 2019-03-05 DIAGNOSIS — N186 End stage renal disease: Secondary | ICD-10-CM | POA: Diagnosis not present

## 2019-03-06 ENCOUNTER — Other Ambulatory Visit: Payer: Self-pay

## 2019-03-06 ENCOUNTER — Ambulatory Visit (INDEPENDENT_AMBULATORY_CARE_PROVIDER_SITE_OTHER): Payer: Medicare HMO | Admitting: *Deleted

## 2019-03-06 DIAGNOSIS — I4891 Unspecified atrial fibrillation: Secondary | ICD-10-CM

## 2019-03-06 DIAGNOSIS — Z5181 Encounter for therapeutic drug level monitoring: Secondary | ICD-10-CM | POA: Diagnosis not present

## 2019-03-06 LAB — POCT INR: INR: 1.8 — AB (ref 2.0–3.0)

## 2019-03-06 NOTE — Patient Instructions (Signed)
Take coumadin 2 tablets tonight and tomorrow night then resume 1.5 tablets daily except 2 tablets on Mondays. Recheck in 6 weeks Dialysis M,W,F

## 2019-03-07 DIAGNOSIS — N186 End stage renal disease: Secondary | ICD-10-CM | POA: Diagnosis not present

## 2019-03-07 DIAGNOSIS — Z992 Dependence on renal dialysis: Secondary | ICD-10-CM | POA: Diagnosis not present

## 2019-03-10 DIAGNOSIS — N186 End stage renal disease: Secondary | ICD-10-CM | POA: Diagnosis not present

## 2019-03-10 DIAGNOSIS — Z992 Dependence on renal dialysis: Secondary | ICD-10-CM | POA: Diagnosis not present

## 2019-03-12 DIAGNOSIS — N186 End stage renal disease: Secondary | ICD-10-CM | POA: Diagnosis not present

## 2019-03-12 DIAGNOSIS — Z992 Dependence on renal dialysis: Secondary | ICD-10-CM | POA: Diagnosis not present

## 2019-03-14 DIAGNOSIS — N186 End stage renal disease: Secondary | ICD-10-CM | POA: Diagnosis not present

## 2019-03-14 DIAGNOSIS — Z992 Dependence on renal dialysis: Secondary | ICD-10-CM | POA: Diagnosis not present

## 2019-03-17 DIAGNOSIS — Z992 Dependence on renal dialysis: Secondary | ICD-10-CM | POA: Diagnosis not present

## 2019-03-17 DIAGNOSIS — N2581 Secondary hyperparathyroidism of renal origin: Secondary | ICD-10-CM | POA: Diagnosis not present

## 2019-03-17 DIAGNOSIS — N186 End stage renal disease: Secondary | ICD-10-CM | POA: Diagnosis not present

## 2019-03-19 DIAGNOSIS — Z992 Dependence on renal dialysis: Secondary | ICD-10-CM | POA: Diagnosis not present

## 2019-03-19 DIAGNOSIS — N186 End stage renal disease: Secondary | ICD-10-CM | POA: Diagnosis not present

## 2019-03-21 DIAGNOSIS — N186 End stage renal disease: Secondary | ICD-10-CM | POA: Diagnosis not present

## 2019-03-21 DIAGNOSIS — Z992 Dependence on renal dialysis: Secondary | ICD-10-CM | POA: Diagnosis not present

## 2019-03-24 DIAGNOSIS — Z992 Dependence on renal dialysis: Secondary | ICD-10-CM | POA: Diagnosis not present

## 2019-03-24 DIAGNOSIS — N186 End stage renal disease: Secondary | ICD-10-CM | POA: Diagnosis not present

## 2019-03-26 DIAGNOSIS — N186 End stage renal disease: Secondary | ICD-10-CM | POA: Diagnosis not present

## 2019-03-26 DIAGNOSIS — Z992 Dependence on renal dialysis: Secondary | ICD-10-CM | POA: Diagnosis not present

## 2019-03-28 DIAGNOSIS — N186 End stage renal disease: Secondary | ICD-10-CM | POA: Diagnosis not present

## 2019-03-28 DIAGNOSIS — Z992 Dependence on renal dialysis: Secondary | ICD-10-CM | POA: Diagnosis not present

## 2019-03-31 DIAGNOSIS — N2581 Secondary hyperparathyroidism of renal origin: Secondary | ICD-10-CM | POA: Diagnosis not present

## 2019-03-31 DIAGNOSIS — N186 End stage renal disease: Secondary | ICD-10-CM | POA: Diagnosis not present

## 2019-03-31 DIAGNOSIS — Z992 Dependence on renal dialysis: Secondary | ICD-10-CM | POA: Diagnosis not present

## 2019-04-02 DIAGNOSIS — N186 End stage renal disease: Secondary | ICD-10-CM | POA: Diagnosis not present

## 2019-04-02 DIAGNOSIS — Z992 Dependence on renal dialysis: Secondary | ICD-10-CM | POA: Diagnosis not present

## 2019-04-04 DIAGNOSIS — Z992 Dependence on renal dialysis: Secondary | ICD-10-CM | POA: Diagnosis not present

## 2019-04-04 DIAGNOSIS — N186 End stage renal disease: Secondary | ICD-10-CM | POA: Diagnosis not present

## 2019-04-07 DIAGNOSIS — N186 End stage renal disease: Secondary | ICD-10-CM | POA: Diagnosis not present

## 2019-04-07 DIAGNOSIS — Z992 Dependence on renal dialysis: Secondary | ICD-10-CM | POA: Diagnosis not present

## 2019-04-08 ENCOUNTER — Emergency Department (HOSPITAL_COMMUNITY): Payer: Medicare HMO

## 2019-04-08 ENCOUNTER — Encounter (HOSPITAL_COMMUNITY): Payer: Self-pay

## 2019-04-08 ENCOUNTER — Other Ambulatory Visit: Payer: Self-pay

## 2019-04-08 ENCOUNTER — Inpatient Hospital Stay (HOSPITAL_COMMUNITY)
Admission: EM | Admit: 2019-04-08 | Discharge: 2019-04-25 | DRG: 193 | Disposition: A | Discharge status: Home Health | Payer: Medicare HMO | Attending: Family Medicine | Admitting: Family Medicine

## 2019-04-08 DIAGNOSIS — J9602 Acute respiratory failure with hypercapnia: Secondary | ICD-10-CM | POA: Diagnosis present

## 2019-04-08 DIAGNOSIS — J168 Pneumonia due to other specified infectious organisms: Secondary | ICD-10-CM | POA: Diagnosis not present

## 2019-04-08 DIAGNOSIS — Z79899 Other long term (current) drug therapy: Secondary | ICD-10-CM

## 2019-04-08 DIAGNOSIS — R5381 Other malaise: Secondary | ICD-10-CM | POA: Diagnosis present

## 2019-04-08 DIAGNOSIS — N186 End stage renal disease: Secondary | ICD-10-CM | POA: Diagnosis not present

## 2019-04-08 DIAGNOSIS — Z8542 Personal history of malignant neoplasm of other parts of uterus: Secondary | ICD-10-CM

## 2019-04-08 DIAGNOSIS — I132 Hypertensive heart and chronic kidney disease with heart failure and with stage 5 chronic kidney disease, or end stage renal disease: Secondary | ICD-10-CM | POA: Diagnosis present

## 2019-04-08 DIAGNOSIS — D539 Nutritional anemia, unspecified: Secondary | ICD-10-CM | POA: Diagnosis present

## 2019-04-08 DIAGNOSIS — R6521 Severe sepsis with septic shock: Secondary | ICD-10-CM

## 2019-04-08 DIAGNOSIS — D689 Coagulation defect, unspecified: Secondary | ICD-10-CM

## 2019-04-08 DIAGNOSIS — Z8249 Family history of ischemic heart disease and other diseases of the circulatory system: Secondary | ICD-10-CM

## 2019-04-08 DIAGNOSIS — Z992 Dependence on renal dialysis: Secondary | ICD-10-CM

## 2019-04-08 DIAGNOSIS — K219 Gastro-esophageal reflux disease without esophagitis: Secondary | ICD-10-CM | POA: Diagnosis present

## 2019-04-08 DIAGNOSIS — R0602 Shortness of breath: Secondary | ICD-10-CM | POA: Diagnosis not present

## 2019-04-08 DIAGNOSIS — D688 Other specified coagulation defects: Secondary | ICD-10-CM | POA: Diagnosis not present

## 2019-04-08 DIAGNOSIS — A419 Sepsis, unspecified organism: Secondary | ICD-10-CM

## 2019-04-08 DIAGNOSIS — Y95 Nosocomial condition: Secondary | ICD-10-CM | POA: Diagnosis present

## 2019-04-08 DIAGNOSIS — K5900 Constipation, unspecified: Secondary | ICD-10-CM | POA: Diagnosis not present

## 2019-04-08 DIAGNOSIS — J189 Pneumonia, unspecified organism: Secondary | ICD-10-CM

## 2019-04-08 DIAGNOSIS — J9601 Acute respiratory failure with hypoxia: Secondary | ICD-10-CM | POA: Diagnosis not present

## 2019-04-08 DIAGNOSIS — R509 Fever, unspecified: Secondary | ICD-10-CM | POA: Diagnosis not present

## 2019-04-08 DIAGNOSIS — I120 Hypertensive chronic kidney disease with stage 5 chronic kidney disease or end stage renal disease: Secondary | ICD-10-CM | POA: Diagnosis not present

## 2019-04-08 DIAGNOSIS — Z90710 Acquired absence of both cervix and uterus: Secondary | ICD-10-CM

## 2019-04-08 DIAGNOSIS — I5043 Acute on chronic combined systolic (congestive) and diastolic (congestive) heart failure: Secondary | ICD-10-CM | POA: Diagnosis present

## 2019-04-08 DIAGNOSIS — Z452 Encounter for adjustment and management of vascular access device: Secondary | ICD-10-CM

## 2019-04-08 DIAGNOSIS — G629 Polyneuropathy, unspecified: Secondary | ICD-10-CM | POA: Diagnosis present

## 2019-04-08 DIAGNOSIS — K559 Vascular disorder of intestine, unspecified: Secondary | ICD-10-CM | POA: Diagnosis not present

## 2019-04-08 DIAGNOSIS — Z20828 Contact with and (suspected) exposure to other viral communicable diseases: Secondary | ICD-10-CM | POA: Diagnosis present

## 2019-04-08 DIAGNOSIS — Z515 Encounter for palliative care: Secondary | ICD-10-CM | POA: Diagnosis not present

## 2019-04-08 DIAGNOSIS — Z8719 Personal history of other diseases of the digestive system: Secondary | ICD-10-CM

## 2019-04-08 DIAGNOSIS — E782 Mixed hyperlipidemia: Secondary | ICD-10-CM | POA: Diagnosis present

## 2019-04-08 DIAGNOSIS — J181 Lobar pneumonia, unspecified organism: Secondary | ICD-10-CM | POA: Diagnosis not present

## 2019-04-08 DIAGNOSIS — Z8349 Family history of other endocrine, nutritional and metabolic diseases: Secondary | ICD-10-CM

## 2019-04-08 DIAGNOSIS — N2581 Secondary hyperparathyroidism of renal origin: Secondary | ICD-10-CM | POA: Diagnosis present

## 2019-04-08 DIAGNOSIS — Z6841 Body Mass Index (BMI) 40.0 and over, adult: Secondary | ICD-10-CM

## 2019-04-08 DIAGNOSIS — R404 Transient alteration of awareness: Secondary | ICD-10-CM | POA: Diagnosis not present

## 2019-04-08 DIAGNOSIS — Z833 Family history of diabetes mellitus: Secondary | ICD-10-CM

## 2019-04-08 DIAGNOSIS — D631 Anemia in chronic kidney disease: Secondary | ICD-10-CM | POA: Diagnosis present

## 2019-04-08 DIAGNOSIS — Z87891 Personal history of nicotine dependence: Secondary | ICD-10-CM

## 2019-04-08 DIAGNOSIS — E8889 Other specified metabolic disorders: Secondary | ICD-10-CM | POA: Diagnosis present

## 2019-04-08 DIAGNOSIS — Z7901 Long term (current) use of anticoagulants: Secondary | ICD-10-CM

## 2019-04-08 DIAGNOSIS — I953 Hypotension of hemodialysis: Secondary | ICD-10-CM | POA: Diagnosis present

## 2019-04-08 DIAGNOSIS — J9801 Acute bronchospasm: Secondary | ICD-10-CM | POA: Diagnosis not present

## 2019-04-08 DIAGNOSIS — I4821 Permanent atrial fibrillation: Secondary | ICD-10-CM | POA: Diagnosis present

## 2019-04-08 LAB — CBC
HCT: 33.9 % — ABNORMAL LOW (ref 36.0–46.0)
Hemoglobin: 11 g/dL — ABNORMAL LOW (ref 12.0–15.0)
MCH: 35.7 pg — ABNORMAL HIGH (ref 26.0–34.0)
MCHC: 32.4 g/dL (ref 30.0–36.0)
MCV: 110.1 fL — ABNORMAL HIGH (ref 80.0–100.0)
Platelets: 146 10*3/uL — ABNORMAL LOW (ref 150–400)
RBC: 3.08 MIL/uL — ABNORMAL LOW (ref 3.87–5.11)
RDW: 13.4 % (ref 11.5–15.5)
WBC: 8 10*3/uL (ref 4.0–10.5)
nRBC: 0 % (ref 0.0–0.2)

## 2019-04-08 LAB — PROTIME-INR
INR: 1.8 — ABNORMAL HIGH (ref 0.8–1.2)
Prothrombin Time: 20.8 seconds — ABNORMAL HIGH (ref 11.4–15.2)

## 2019-04-08 LAB — TROPONIN I (HIGH SENSITIVITY)
Troponin I (High Sensitivity): 20 ng/L — ABNORMAL HIGH (ref <18)
Troponin I (High Sensitivity): 22 ng/L — ABNORMAL HIGH (ref <18)

## 2019-04-08 LAB — BRAIN NATRIURETIC PEPTIDE: B Natriuretic Peptide: 344 pg/mL — ABNORMAL HIGH (ref 0.0–100.0)

## 2019-04-08 LAB — BASIC METABOLIC PANEL
Anion gap: 14 (ref 5–15)
BUN: 45 mg/dL — ABNORMAL HIGH (ref 8–23)
CO2: 25 mmol/L (ref 22–32)
Calcium: 9.8 mg/dL (ref 8.9–10.3)
Chloride: 99 mmol/L (ref 98–111)
Creatinine, Ser: 9.98 mg/dL — ABNORMAL HIGH (ref 0.44–1.00)
GFR calc Af Amer: 4 mL/min — ABNORMAL LOW (ref >60)
GFR calc non Af Amer: 3 mL/min — ABNORMAL LOW (ref >60)
Glucose, Bld: 140 mg/dL — ABNORMAL HIGH (ref 70–99)
Potassium: 4.1 mmol/L (ref 3.5–5.1)
Sodium: 138 mmol/L (ref 135–145)

## 2019-04-08 LAB — SARS CORONAVIRUS 2 BY RT PCR (HOSPITAL ORDER, PERFORMED IN ~~LOC~~ HOSPITAL LAB): SARS Coronavirus 2: NEGATIVE

## 2019-04-08 LAB — LACTIC ACID, PLASMA
Lactic Acid, Venous: 1.6 mmol/L (ref 0.5–1.9)
Lactic Acid, Venous: 1.3 mmol/L (ref 0.5–1.9)

## 2019-04-08 MED ORDER — METOPROLOL TARTRATE 50 MG PO TABS
50.0000 mg | ORAL_TABLET | Freq: Every day | ORAL | Status: DC
  Administered 2019-04-08 – 2019-04-10 (×3): 50 mg via ORAL
  Filled 2019-04-08 (×3): qty 1

## 2019-04-08 MED ORDER — SODIUM CHLORIDE 0.9 % IV SOLN
1.0000 g | Freq: Once | INTRAVENOUS | Status: AC
  Administered 2019-04-08: 1 g via INTRAVENOUS
  Filled 2019-04-08: qty 10

## 2019-04-08 MED ORDER — SEVELAMER CARBONATE 800 MG PO TABS
2400.0000 mg | ORAL_TABLET | Freq: Three times a day (TID) | ORAL | Status: DC
  Administered 2019-04-09 – 2019-04-25 (×45): 2400 mg via ORAL
  Filled 2019-04-08 (×46): qty 3

## 2019-04-08 MED ORDER — GABAPENTIN 100 MG PO CAPS
100.0000 mg | ORAL_CAPSULE | Freq: Two times a day (BID) | ORAL | Status: DC
  Administered 2019-04-08 – 2019-04-25 (×33): 100 mg via ORAL
  Filled 2019-04-08 (×33): qty 1

## 2019-04-08 MED ORDER — PANTOPRAZOLE SODIUM 40 MG PO TBEC
40.0000 mg | DELAYED_RELEASE_TABLET | Freq: Every day | ORAL | Status: DC
  Administered 2019-04-09 – 2019-04-25 (×17): 40 mg via ORAL
  Filled 2019-04-08 (×18): qty 1

## 2019-04-08 MED ORDER — SODIUM CHLORIDE 0.9 % IV SOLN
500.0000 mg | INTRAVENOUS | Status: DC
  Administered 2019-04-09 – 2019-04-10 (×2): 500 mg via INTRAVENOUS
  Filled 2019-04-08 (×2): qty 500

## 2019-04-08 MED ORDER — WARFARIN SODIUM 7.5 MG PO TABS
7.5000 mg | ORAL_TABLET | ORAL | Status: DC
  Filled 2019-04-08: qty 1

## 2019-04-08 MED ORDER — METOPROLOL TARTRATE 50 MG PO TABS
50.0000 mg | ORAL_TABLET | ORAL | Status: DC
  Administered 2019-04-10 – 2019-04-13 (×3): 50 mg via ORAL
  Filled 2019-04-08 (×2): qty 1

## 2019-04-08 MED ORDER — WARFARIN SODIUM 7.5 MG PO TABS
7.5000 mg | ORAL_TABLET | ORAL | Status: DC
  Administered 2019-04-08 – 2019-04-09 (×2): 7.5 mg via ORAL
  Filled 2019-04-08 (×3): qty 1

## 2019-04-08 MED ORDER — VITAMIN D 25 MCG (1000 UNIT) PO TABS
2000.0000 [IU] | ORAL_TABLET | Freq: Every evening | ORAL | Status: DC
  Administered 2019-04-09 – 2019-04-15 (×7): 2000 [IU] via ORAL
  Filled 2019-04-08 (×7): qty 2

## 2019-04-08 MED ORDER — ACETAMINOPHEN 500 MG PO TABS
1000.0000 mg | ORAL_TABLET | Freq: Four times a day (QID) | ORAL | Status: DC | PRN
  Administered 2019-04-16 – 2019-04-21 (×4): 1000 mg via ORAL
  Filled 2019-04-08 (×4): qty 2

## 2019-04-08 MED ORDER — METOPROLOL TARTRATE 50 MG PO TABS: 50.0000 mg | ORAL_TABLET | ORAL | Status: DC

## 2019-04-08 MED ORDER — WARFARIN SODIUM 5 MG PO TABS: 10.0000 mg | ORAL_TABLET | ORAL | Status: DC

## 2019-04-08 MED ORDER — SODIUM CHLORIDE 0.9 % IV SOLN
INTRAVENOUS | Status: DC
  Administered 2019-04-09: 50 mL/h via INTRAVENOUS
  Administered 2019-04-09 – 2019-04-13 (×3): via INTRAVENOUS

## 2019-04-08 MED ORDER — ENOXAPARIN SODIUM 40 MG/0.4ML ~~LOC~~ SOLN: 40.0000 mg | SUBCUTANEOUS | Status: DC

## 2019-04-08 MED ORDER — WARFARIN - PHYSICIAN DOSING INPATIENT: Freq: Every day | Status: DC

## 2019-04-08 MED ORDER — SODIUM CHLORIDE 0.9 % IV SOLN
500.0000 mg | Freq: Once | INTRAVENOUS | Status: AC
  Administered 2019-04-08: 19:00:00 500 mg via INTRAVENOUS
  Filled 2019-04-08: qty 500

## 2019-04-08 MED ORDER — ENOXAPARIN SODIUM 60 MG/0.6ML ~~LOC~~ SOLN
0.5000 mg/kg | SUBCUTANEOUS | Status: DC
  Administered 2019-04-09: 54.9 mg via SUBCUTANEOUS
  Filled 2019-04-08 (×2): qty 0.6

## 2019-04-08 MED ORDER — SODIUM CHLORIDE 0.9 % IV SOLN
2.0000 g | INTRAVENOUS | Status: DC
  Filled 2019-04-08: qty 20

## 2019-04-08 NOTE — ED Triage Notes (Signed)
Pt has been having difficulty breathing for the last few days. Dyspnea on exertion. Denies being around anybody diagnosed with COVID. Denies fevers. NAD.

## 2019-04-08 NOTE — ED Provider Notes (Addendum)
[facility] EMERGENCY DEPARTMENT Provider Note   CSN: [id number] Arrival date & time: 04/08/19  1451     History   Chief Complaint Chief Complaint  Patient presents with  . Shortness of Breath    HPI Sabrina Beck is a 78 y.o. female.  She has a history of A. fib and is on Coumadin, end-stage renal disease and had dialysis yesterday.  She is complaining of a few days of increased shortness of breath/dyspnea on exertion.  She says that it worsens with exertion and improves with rest.  It does not occur at night.  Not associate with any chest pain fevers cough vomiting diarrhea sore throat.     The history is provided by the patient.  Shortness of Breath Severity:  Moderate Onset quality:  Gradual Timing:  Intermittent Progression:  Unchanged Chronicity:  New Context: activity   Relieved by:  Rest Worsened by:  Activity Ineffective treatments:  None tried Associated symptoms: no abdominal pain, no chest pain, no cough, no diaphoresis, no fever, no headaches, no hemoptysis, no neck pain, no rash, no sore throat, no sputum production, no syncope, no vomiting and no wheezing     Past Medical History:  Diagnosis Date  . Anemia   . Atrial fibrillation ([place])   . Chest pain 05/15/2006   Stress test negative for ischemia  . Diastolic heart failure ([place])   . Diverticulosis   . ESRD on hemodialysis ([place])   . GERD (gastroesophageal reflux disease)   . Headache   . Mixed hyperlipidemia   . Peripheral neuropathy   . PONV (postoperative nausea and vomiting)   . Systemic hypertension   . Uterine cancer [facility])     Patient Active Problem List   Diagnosis Date Noted  . Permanent atrial fibrillation 08/22/2018  . Acute systolic CHF (congestive heart failure) ([place]) 08/22/2018  . Community acquired pneumonia   . HCAP (healthcare-associated pneumonia) 08/18/2018  . Acute respiratory failure with hypoxia ([place]) 08/18/2018  . Pulmonary edema 08/18/2018  . Lobar pneumonia ([place])  08/18/2018  . Dependence on renal dialysis ([place]) 10/19/2015  . Pure hypercholesterolemia 10/19/2015  . ESRD (end stage renal disease) ([place]) 05/19/2014  . 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 Abscess re-check 01/19/2014  . Breast abscess of female 01/19/2014  . Chronic diastolic CHF (congestive heart failure) ([place]) 12/25/2013  . Dyspnea 12/21/2013  . CHF (congestive heart failure) ([place]) 12/21/2013  . End stage renal disease ([place]) 11/06/2013  . CKD (chronic kidney disease) stage 5, GFR less than 15 ml/min (HCC) 10/21/2013  . HTN (hypertension) 10/21/2013  . Mixed hyperlipidemia 10/21/2013  . Encounter for therapeutic drug monitoring 09/03/2013  . Atrial fibrillation ([place]) 10/11/2012  . Long term current use of anticoagulant therapy 10/11/2012  . Anemia 03/27/2011  . Gastro-esophageal reflux disease without esophagitis 05/27/2010  . Malignant neoplasm of uterus ([place]) 05/27/2010    Past Surgical History:  Procedure Laterality Date  . ABDOMINAL HYSTERECTOMY    . APPENDECTOMY    . AV FISTULA PLACEMENT Left 11/17/2013   Procedure: CREATION OF LEFT RADIOCEPHALIC ARTERIOVENOUS (AV) FISTULA ;  Surgeon: [name], MD;  Location: [place];  Service: Vascular;  Laterality: Left;  . BREAST SURGERY Left    tumors non canerous 2- 3 different surgeries  . CATARACT EXTRACTION W/PHACO Right 10/26/2016   Procedure: CATARACT EXTRACTION PHACO AND INTRAOCULAR LENS PLACEMENT (IOC) CDE= 11.91;  Surgeon: Tonny Branch, MD;  Location: [facility];  Service: Ophthalmology;  Laterality: Right;  right - pt knows to arrive at 6:30  . CATARACT EXTRACTION W/PHACO Left 11/30/2016   Procedure: CATARACT EXTRACTION PHACO AND INTRAOCULAR LENS PLACEMENT (IOC) CDE - 7.92 ;  Surgeon: Tonny Branch, MD;  Location: [facility];  Service: Ophthalmology;  Laterality: Left;  left  . CHOLECYSTECTOMY    . COLONOSCOPY W/ BIOPSIES AND POLYPECTOMY    . FISTULA SUPERFICIALIZATION Left 05/19/2014    Procedure: FISTULA SUPERFICIALIZATION-LEFT ARM;  Surgeon: [name], MD;  Location: [place];  Service: Vascular;  Laterality: Left;  . INSERTION OF DIALYSIS CATHETER N/A 01/24/2014   Procedure: INSERTION OF DIALYSIS CATHETER;  Surgeon: Rosetta Posner, MD;  Location: [facility];  Service: Vascular;  Laterality: N/A;  . KNEE ARTHROSCOPY    . TUBAL LIGATION       OB History    Gravida  3   Para  2   Term  2   Preterm      AB  1   Living        SAB  1   TAB      Ectopic      Multiple      Live Births               Home Medications    Prior to Admission medications   Medication Sig Start Date End Date Taking? Authorizing Provider  acetaminophen (TYLENOL) 500 MG tablet Take 1,000 mg by mouth every 6 (six) hours as needed for moderate pain.     [provider]  calcium acetate (PHOSLO) 667 MG capsule as needed. 2 with meals and 1 with snacks 10/01/18   [provider]  Cholecalciferol (VITAMIN D) 2000 UNITS CAPS Take 2,000 Units by mouth daily.     [provider]  gabapentin (NEURONTIN) 100 MG capsule 100 mg daily. 09/13/18   [provider]  metoprolol tartrate (LOPRESSOR) 50 MG tablet Take 1 tablet (50 mg total) by mouth 2 (two) times daily. 10/15/18   Lendon Colonel, NP  Multiple Vitamins-Minerals (PRESERVISION AREDS 2 PO) Take 1 tablet by mouth 2 (two) times daily.    [provider]  multivitamin (RENA-VIT) TABS tablet Take by mouth. 07/06/18   [provider]  Omega-3 Fatty Acids (OMEGA-3 FISH OIL) 300 MG CAPS Take 300 mg by mouth 2 (two) times daily.    [provider]  omeprazole (PRILOSEC) 20 MG capsule Take 20 mg by mouth daily.    [provider]  Propylene Glycol (SYSTANE BALANCE) 0.6 % SOLN Place 1 drop into both eyes 3 (three) times daily as needed (for burning or dry eyes).     [provider]  RENVELA 800 MG tablet Take 800-1,600 mg by mouth See admin instructions. Takes 1  tablet with snacks and 2 tablets with meals 04/17/15   [provider]  warfarin (COUMADIN) 5 MG tablet Take 1.5 to 2 tablets by mouth daily as directed by coumadin clinic 10/15/18   Lendon Colonel, NP    Family History Family History  Problem Relation Age of Onset  . Diabetes Mother   . Hypertension Mother   . Diabetes Father   . Heart disease Father        before age 18  . Heart attack Father   . Diabetes Sister   . Hyperlipidemia Sister   . Hypertension Sister   . Cancer Brother   . Diabetes Brother   . Heart disease Brother   . Hypertension Brother   . Heart  attack Brother     Social History Social History   Tobacco Use  . Smoking status: Former Smoker    Quit date: 08/08/1999    Years since quitting: 19.6  . Smokeless tobacco: Never Used  Substance Use Topics  . Alcohol use: No    Alcohol/week: 0.0 standard drinks  . Drug use: No     Allergies   Patient has no known allergies.   Review of Systems Review of Systems  Constitutional: Negative for diaphoresis and fever.  HENT: Negative for sore throat.   Eyes: Negative for visual disturbance.  Respiratory: Positive for shortness of breath. Negative for cough, hemoptysis, sputum production and wheezing.   Cardiovascular: Negative for chest pain and syncope.  Gastrointestinal: Negative for abdominal pain and vomiting.  Genitourinary: Negative for dysuria.  Musculoskeletal: Negative for neck pain.  Skin: Negative for rash.  Neurological: Negative for headaches.     Physical Exam Updated Vital Signs BP (!) 158/80 (BP Location: Right Arm)   Pulse 95   Temp 99.1 F (37.3 C) (Oral)   Resp 14   Ht 5\' 4"  (1.626 m)   Wt 109.8 kg   SpO2 95%   BMI 41.54 kg/m   Physical Exam Vitals signs and nursing note reviewed.  Constitutional:      General: She is not in acute distress.    Appearance: She is well-developed.  HENT:     Head: Normocephalic and atraumatic.  Eyes:     Conjunctiva/sclera:  Conjunctivae normal.  Neck:     Musculoskeletal: Neck supple.  Cardiovascular:     Rate and Rhythm: Normal rate and regular rhythm.     Heart sounds: No murmur.  Pulmonary:     Effort: Pulmonary effort is normal. No respiratory distress.     Breath sounds: Normal breath sounds.  Abdominal:     Palpations: Abdomen is soft.     Tenderness: There is no abdominal tenderness.  Musculoskeletal: Normal range of motion.     Right lower leg: She exhibits no tenderness.     Left lower leg: She exhibits no tenderness.     Comments: Fistula left FA with thrill  Skin:    General: Skin is warm and dry.     Capillary Refill: Capillary refill takes less than 2 seconds.  Neurological:     General: No focal deficit present.     Mental Status: She is alert and oriented to person, place, and time.      ED Treatments / Results  Labs (all labs ordered are listed, but only abnormal results are displayed) Labs Reviewed  CBC - Abnormal; Notable for the following components:      Result Value   RBC 3.08 (*)    Hemoglobin 11.0 (*)    HCT 33.9 (*)    MCV 110.1 (*)    MCH 35.7 (*)    Platelets 146 (*)    All other components within normal limits  BASIC METABOLIC PANEL - Abnormal; Notable for the following components:   Glucose, Bld 140 (*)    BUN 45 (*)    Creatinine, Ser 9.98 (*)    GFR calc non Af Amer 3 (*)    GFR calc Af Amer 4 (*)    All other components within normal limits  BRAIN NATRIURETIC PEPTIDE - Abnormal; Notable for the following components:   B Natriuretic Peptide 344.0 (*)    All other components within normal limits  PROTIME-INR - Abnormal; Notable for the following components:  Prothrombin Time 20.8 (*)    INR 1.8 (*)    All other components within normal limits  CBC WITH DIFFERENTIAL/PLATELET - Abnormal; Notable for the following components:   RBC 2.74 (*)    Hemoglobin 9.8 (*)    HCT 30.4 (*)    MCV 110.9 (*)    MCH 35.8 (*)    Platelets 141 (*)    All other  components within normal limits  COMPREHENSIVE METABOLIC PANEL - Abnormal; Notable for the following components:   Glucose, Bld 110 (*)    BUN 51 (*)    Creatinine, Ser 11.14 (*)    Total Protein 6.1 (*)    Albumin 3.2 (*)    AST 13 (*)    GFR calc non Af Amer 3 (*)    GFR calc Af Amer 3 (*)    All other components within normal limits  TROPONIN I (HIGH SENSITIVITY) - Abnormal; Notable for the following components:   Troponin I (High Sensitivity) 20 (*)    All other components within normal limits  TROPONIN I (HIGH SENSITIVITY) - Abnormal; Notable for the following components:   Troponin I (High Sensitivity) 22 (*)    All other components within normal limits  SARS CORONAVIRUS 2 (HOSPITAL ORDER, [place] [facility])  CULTURE, BLOOD (ROUTINE X 2)  CULTURE, BLOOD (ROUTINE X 2)  EXPECTORATED SPUTUM ASSESSMENT W REFEX TO RESP CULTURE  EXPECTORATED SPUTUM ASSESSMENT W REFEX TO RESP CULTURE  LACTIC ACID, PLASMA  LACTIC ACID, PLASMA  VITAMIN B12  HIV ANTIBODY (ROUTINE TESTING W REFLEX)  FOLATE RBC    EKG EKG Interpretation  Date/Time:  Tuesday April 08 2019 16:12:28 EDT Ventricular Rate:  81 PR Interval:    QRS Duration: 102 QT Interval:  398 QTC Calculation: 462 R Axis:   -54 Text Interpretation:  Atrial fibrillation Left anterior fascicular block Anteroseptal infarct, age indeterminate similar to today Confirmed by Aletta Edouard [phone]) on 04/08/2019 4:16:57 PM   Radiology Dg Chest Port 1 View  Result Date: 04/08/2019 CLINICAL DATA:  Difficulty breathing for past few days, dyspnea on exertion, denies fever, history atrial fibrillation, heart failure, end-stage renal disease on dialysis, systemic hypertension, uterine cancer EXAM: PORTABLE CHEST 1 VIEW COMPARISON:  Portable exam 1645 hours compared to 08/18/2018 FINDINGS: Enlargement of cardiac silhouette. Mediastinal contours and pulmonary vascularity normal. Atherosclerotic calcification aorta. RIGHT  lower lobe consolidation consistent with pneumonia. Remaining lungs clear. No pleural effusion or pneumothorax. Bones demineralized. IMPRESSION: Enlargement of cardiac silhouette. RIGHT lower lobe consolidation consistent with pneumonia. Electronically Signed   By: Lavonia Dana M.D.   On: 04/08/2019 17:18    Procedures Procedures (including critical care time)  Medications Ordered in ED Medications  acetaminophen (TYLENOL) tablet 1,000 mg (has no administration in time range)  pantoprazole (PROTONIX) EC tablet 40 mg (has no administration in time range)  sevelamer carbonate (RENVELA) tablet 2,400 mg (has no administration in time range)  gabapentin (NEURONTIN) capsule 100 mg (100 mg Oral Given 04/08/19 2358)  cholecalciferol (VITAMIN D3) tablet 2,000 Units (has no administration in time range)  0.9 %  sodium chloride infusion ( Intravenous New Bag/Given 04/09/19 0005)  cefTRIAXone (ROCEPHIN) 2 g in sodium chloride 0.9 % 100 mL IVPB (has no administration in time range)  azithromycin (ZITHROMAX) 500 mg in sodium chloride 0.9 % 250 mL IVPB (has no administration in time range)  Warfarin - Physician Dosing Inpatient (has no administration in time range)  warfarin (COUMADIN) tablet 10 mg (has no administration in  time range)  warfarin (COUMADIN) tablet 7.5 mg (7.5 mg Oral Given 04/08/19 2359)  metoprolol tartrate (LOPRESSOR) tablet 50 mg (50 mg Oral Given 04/08/19 2358)  metoprolol tartrate (LOPRESSOR) tablet 50 mg (has no administration in time range)  enoxaparin (LOVENOX) injection 54.9 mg (has no administration in time range)  cefTRIAXone (ROCEPHIN) 1 g in sodium chloride 0.9 % 100 mL IVPB (0 g Intravenous Stopped 04/08/19 2035)  azithromycin (ZITHROMAX) 500 mg in sodium chloride 0.9 % 250 mL IVPB (0 mg Intravenous Stopped 04/08/19 2035)     Initial Impression / Assessment and Plan / ED Course  I have reviewed the triage vital signs and the nursing notes.  Pertinent labs & imaging results that were  available during my care of the patient were reviewed by me and considered in my medical decision making (see chart for details).  Clinical Course as of Apr 08 958  Tue Sep 01, 618  3625 78 year old female history of A. fib and end-stage renal disease here with dyspnea on exertion.  She satting well and appears very comfortable on room air laying down in the bed.  Checking EKG chest x-ray lab work.   [MB]  5916 Patient's work-up significant for anemia although she is at her baseline.  Creatinine is also elevated but she has end-stage renal disease.  She is slightly subtherapeutic on her INR and her BNP is elevated with no priors to compare with.  Her chest x-ray was read as right lower lobe pneumonia.  I have ordered her some antibiotics and she is agreeable to admission.   [MB]  3846 She said she supposed to get dialysis tomorrow so that may complicate her staying here.   [MB]  2014 Discussed with [facility] hospitalist who will evaluate the patient for admission.   [MB]    Clinical Course User Index [MB] Hayden Rasmussen, MD   CHA2DS2/VAS Stroke Risk Points  Current as of 55 minutes ago     5 >= 2 Points: High Risk  1 - 1.99 Points: Medium Risk  0 Points: Low Risk    This is the only CHA2DS2/VAS Stroke Risk Points available for the past  year.: Last Change: N/A     Details    This score determines the patient's risk of having a stroke if the  patient has atrial fibrillation.       Points Metrics  1 Has Congestive Heart Failure:  Yes    Current as of 55 minutes ago  0 Has Vascular Disease:  No    Current as of 55 minutes ago  1 Has Hypertension:  Yes    Current as of 55 minutes ago  2 Age:  1    Current as of 55 minutes ago  0 Has Diabetes:  No    Current as of 55 minutes ago  0 Had Stroke:  No  Had TIA:  No  Had thromboembolism:  No    Current as of 55 minutes ago  1 Female:  Yes    Current as of 55 minutes ago               Final Clinical Impressions(s) / ED  Diagnoses   Final diagnoses:  Pneumonia of right lower lobe due to infectious organism [facility])  Shortness of breath  ESRD (end stage renal disease) [facility])    ED Discharge Orders    None       Hayden Rasmussen, MD 04/09/19 1000    Hayden Rasmussen, MD  04/09/19 1001  

## 2019-04-08 NOTE — ED Notes (Signed)
IV antibiotics paused for lab to do blood cultures.

## 2019-04-08 NOTE — H&P (Signed)
TRH H&P    Patient Demographics:    Sabrina Beck, is a 78 y.o. female  MRN: [id number]  DOB - [birth year]  Admit Date - 04/08/2019  Referring MD/NP/PA: Dr. Melina Copa  Outpatient Primary MD for the patient is Redmond School, MD  Patient coming from: Home  Chief complaint-shortness of breath   HPI:    Sabrina Beck  is a 78 y.o. 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  Patient had been getting progressively short of breath over 5 days.  She reports that it felt like she just could not take a deep breath in.  Patient denies any chest pain, coughing, fever, dizziness.  Patient has been admitted for pneumonia before in January 2020.  This episode feels like that episode only not as bad.  Patient reports no sick contacts.  Covid test negative.  ED course Chemistry shows a glucose of 140, BUN of 45, creatinine of 9.98, and GFR of 3.  Patient has a BNP of 344.  Initial troponin is 20.  Hematology reveals no leukocytosis, but a macrocytic anemia with a hemoglobin of 11.  Chest x-ray was done that shows enlargement of cardiac silhouette.  Right lower lobe consolidation consistent with pneumonia.  Blood cultures drawn.  Lactic acid 1.6.  Troponin stable at 22.  Second lactic acid 1.3.  Curb 65 is 0.  Patient brought in as observation.     Review of systems:    In addition to the HPI above,  No Fever-chills, No Headache, No changes with Vision or hearing, No problems swallowing food or Liquids, No Chest pain, Cough endorses shortness of Breath, No Abdominal pain, No Nausea or Vomiting, bowel movements are regular, No Blood in stool or Urine, No dysuria, No new skin rashes or bruises, No new joints pains-aches,  No new  weakness, tingling, numbness in any extremity, No recent weight gain or loss, No polyuria, polydypsia or polyphagia, No significant Mental Stressors.  All other systems reviewed and are negative.    Past History of the following :    Past Medical History:  Diagnosis Date  . Anemia   . Atrial fibrillation ([place])   . Chest pain 05/15/2006   Stress test negative for ischemia  . Diastolic heart failure ([place])   . Diverticulosis   . ESRD on hemodialysis ([place])   . GERD (gastroesophageal reflux disease)   . Headache   . Mixed hyperlipidemia   . Peripheral neuropathy   . PONV (postoperative nausea and vomiting)   . Systemic hypertension   . Uterine cancer [facility])       Past Surgical History:  Procedure Laterality Date  . ABDOMINAL HYSTERECTOMY    . APPENDECTOMY    . AV FISTULA PLACEMENT Left 11/17/2013   Procedure: CREATION OF LEFT RADIOCEPHALIC ARTERIOVENOUS (AV) FISTULA ;  Surgeon: [name], MD;  Location: [place];  Service: Vascular;  Laterality: Left;  . BREAST SURGERY  Left    tumors non canerous 2- 3 different surgeries  . CATARACT EXTRACTION W/PHACO Right 10/26/2016   Procedure: CATARACT EXTRACTION PHACO AND INTRAOCULAR LENS PLACEMENT (IOC) CDE= 11.91;  Surgeon: Tonny Branch, MD;  Location: [facility];  Service: Ophthalmology;  Laterality: Right;  right - pt knows to arrive at 6:30  . CATARACT EXTRACTION W/PHACO Left 11/30/2016   Procedure: CATARACT EXTRACTION PHACO AND INTRAOCULAR LENS PLACEMENT (IOC) CDE - 7.92 ;  Surgeon: Tonny Branch, MD;  Location: [facility];  Service: Ophthalmology;  Laterality: Left;  left  . CHOLECYSTECTOMY    . COLONOSCOPY W/ BIOPSIES AND POLYPECTOMY    . FISTULA SUPERFICIALIZATION Left 05/19/2014   Procedure: FISTULA SUPERFICIALIZATION-LEFT ARM;  Surgeon: [name], MD;  Location: [place];  Service: Vascular;  Laterality: Left;  . INSERTION OF DIALYSIS CATHETER N/A 01/24/2014   Procedure: INSERTION OF DIALYSIS CATHETER;  Surgeon: Rosetta Posner, MD;   Location: [facility] OR;  Service: Vascular;  Laterality: N/A;  . KNEE ARTHROSCOPY    . TUBAL LIGATION        Social History:      Social History   Tobacco Use  . Smoking status: Former Smoker    Quit date: 08/08/1999    Years since quitting: 19.6  . Smokeless tobacco: Never Used  Substance Use Topics  . Alcohol use: No    Alcohol/week: 0.0 standard drinks       Family History :     Family History  Problem Relation Age of Onset  . Diabetes Mother   . Hypertension Mother   . Diabetes Father   . Heart disease Father        before age 18  . Heart attack Father   . Diabetes Sister   . Hyperlipidemia Sister   . Hypertension Sister   . Cancer Brother   . Diabetes Brother   . Heart disease Brother   . Hypertension Brother   . Heart attack Brother       Home Medications:   Prior to Admission medications   Medication Sig Start Date End Date Taking? Authorizing Provider  acetaminophen (TYLENOL) 500 MG tablet Take 1,000 mg by mouth every 6 (six) hours as needed for moderate pain.    Yes [provider]  Cholecalciferol (VITAMIN D) 2000 UNITS CAPS Take 2,000 Units by mouth every evening.    Yes [provider]  gabapentin (NEURONTIN) 100 MG capsule Take 100 mg by mouth 2 (two) times daily.  09/13/18  Yes [provider]  metoprolol tartrate (LOPRESSOR) 50 MG tablet Take 1 tablet (50 mg total) by mouth 2 (two) times daily. Patient taking differently: Take 50 mg by mouth See admin instructions. Take one tablet every day at bedtime. Take one tablet every day in the morning on days that are non-dialysis days (T-TH-S-S) 10/15/18  Yes Lendon Colonel, NP  Multiple Vitamins-Minerals (PRESERVISION AREDS 2 PO) Take 1 tablet by mouth 2 (two) times daily.   Yes [provider]  multivitamin (RENA-VIT) TABS tablet Take 1 tablet by mouth every evening.  07/06/18  Yes [provider]  Omega-3 Fatty Acids (OMEGA-3 FISH OIL) 300 MG CAPS Take 300 mg by  mouth 2 (two) times daily.   Yes [provider]  omeprazole (PRILOSEC) 20 MG capsule Take 20 mg by mouth every morning.    Yes [provider]  Propylene Glycol (SYSTANE BALANCE) 0.6 % SOLN Place 1 drop into both eyes 3 (three) times daily as needed (for burning  or dry eyes).    Yes [provider]  RENVELA 800 MG tablet Take 2,400 mg by mouth 3 (three) times daily with meals. Takes 1 tablet with snacks 04/17/15  Yes [provider]  warfarin (COUMADIN) 5 MG tablet Take 1.5 to 2 tablets by mouth daily as directed by coumadin clinic Patient taking differently: Take 7.5-10 mg by mouth See admin instructions. 10mg  (10mg  total) on Mondays, then take 7.5mg  (1.5 tablets) on all other days in the evening. 10/15/18  Yes Lendon Colonel, NP     Allergies:    No Known Allergies   Physical Exam:   Vitals  Blood pressure 134/87, pulse 91, temperature 98.7 F (37.1 C), temperature source Oral, resp. rate 18, height 5\' 4"  (1.626 m), weight 109.8 kg, SpO2 (!) 86 %.  1.  General: Lying supine in bed in no acute distress  2. Psychiatric: Pleasant and cooperative alert and oriented x3  3. Neurologic: Cranial nerves II through XII grossly intact no focal deficits on limited exam  4. HEENMT:  Head is atraumatic normocephalic pupils are reactive to light neck has no masses trachea is midline  5. Respiratory : Diminished breath sounds in the lower lung fields bilaterally No crackles or wheezes  6. Cardiovascular : Heart rate is normal, rhythm is irregularly irregular  7. Gastrointestinal:  Abdomen is soft nondistended nontender to palpation with normoactive bowel sounds  8. Skin:  Skin reveals no new rashes lesions on on limited exam  9.Musculoskeletal:  2+ pitting edema in the lower extremities    Data Review:    CBC Recent Labs  Lab 04/08/19 1600  WBC 8.0  HGB 11.0*  HCT 33.9*  PLT 146*  MCV 110.1*  MCH 35.7*  MCHC 32.4  RDW 13.4    ------------------------------------------------------------------------------------------------------------------  Results for orders placed or performed during the hospital encounter of 04/08/19 (from the past 48 hour(s))  CBC     Status: Abnormal   Collection Time: 04/08/19  4:00 PM  Result Value Ref Range   WBC 8.0 4.0 - 10.5 K/uL   RBC 3.08 (L) 3.87 - 5.11 MIL/uL   Hemoglobin 11.0 (L) 12.0 - 15.0 g/dL   HCT 33.9 (L) 36.0 - 46.0 %   MCV 110.1 (H) 80.0 - 100.0 fL   MCH 35.7 (H) 26.0 - 34.0 pg   MCHC 32.4 30.0 - 36.0 g/dL   RDW 13.4 11.5 - 15.5 %   Platelets 146 (L) 150 - 400 K/uL   nRBC 0.0 0.0 - 0.2 %    Comment: Performed at [facility], [address], [place], [place] [address]  Basic metabolic panel     Status: Abnormal   Collection Time: 04/08/19  4:00 PM  Result Value Ref Range   Sodium 138 135 - 145 mmol/L   Potassium 4.1 3.5 - 5.1 mmol/L   Chloride 99 98 - 111 mmol/L   CO2 25 22 - 32 mmol/L   Glucose, Bld 140 (H) 70 - 99 mg/dL   BUN 45 (H) 8 - 23 mg/dL   Creatinine, Ser 9.98 (H) 0.44 - 1.00 mg/dL   Calcium 9.8 8.9 - 10.3 mg/dL   GFR calc non Af Amer 3 (L) >60 mL/min   GFR calc Af Amer 4 (L) >60 mL/min   Anion gap 14 5 - 15    Comment: Performed at [facility], [address], [place], [place] [address]  Brain natriuretic peptide     Status: Abnormal   Collection Time: 04/08/19  4:00  PM  Result Value Ref Range   B Natriuretic Peptide 344.0 (H) 0.0 - 100.0 pg/mL    Comment: Performed at [facility], [address], [place], [place] [address]  Troponin I (High Sensitivity)     Status: Abnormal   Collection Time: 04/08/19  4:00 PM  Result Value Ref Range   Troponin I (High Sensitivity) 20 (H) <18 ng/L    Comment: (NOTE) Elevated high sensitivity troponin I (hsTnI) values and significant  changes across serial measurements may suggest ACS but many other  chronic and acute conditions are known to elevate hsTnI results.  Refer to the "Links" section for chest pain  algorithms and additional  guidance. Performed at [facility], [address], [place], [place] [address]   Protime-INR     Status: Abnormal   Collection Time: 04/08/19  4:01 PM  Result Value Ref Range   Prothrombin Time 20.8 (H) 11.4 - 15.2 seconds   INR 1.8 (H) 0.8 - 1.2    Comment: (NOTE) INR goal varies based on device and disease states. Performed at [facility], [address], [place],  [address]   SARS Coronavirus 2 [facility] order, Performed in [facility] hospital lab) Nasopharyngeal Nasopharyngeal Swab     Status: None   Collection Time: 04/08/19  5:50 PM   Specimen: Nasopharyngeal Swab  Result Value Ref Range   SARS Coronavirus 2 NEGATIVE NEGATIVE    Comment: (NOTE) If result is NEGATIVE SARS-CoV-2 target nucleic acids are NOT DETECTED. The SARS-CoV-2 RNA is generally detectable in upper and lower  respiratory specimens during the acute phase of infection. The lowest  concentration of SARS-CoV-2 viral copies this assay can detect is 250  copies / mL. A negative result does not preclude SARS-CoV-2 infection  and should not be used as the sole basis for treatment or other  patient management decisions.  A negative result may occur with  improper specimen collection / handling, submission of specimen other  than nasopharyngeal swab, presence of viral mutation(s) within the  areas targeted by this assay, and inadequate number of viral copies  (<250 copies / mL). A negative result must be combined with clinical  observations, patient history, and epidemiological information. If result is POSITIVE SARS-CoV-2 target nucleic acids are DETECTED. The SARS-CoV-2 RNA is generally detectable in upper and lower  respiratory specimens dur ing the acute phase of infection.  Positive  results are indicative of active infection with SARS-CoV-2.  Clinical  correlation with patient history and other diagnostic information is  necessary to determine patient infection status.   Positive results do  not rule out bacterial infection or co-infection with other viruses. If result is PRESUMPTIVE POSTIVE SARS-CoV-2 nucleic acids MAY BE PRESENT.   A presumptive positive result was obtained on the submitted specimen  and confirmed on repeat testing.  While 2019 novel coronavirus  (SARS-CoV-2) nucleic acids may be present in the submitted sample  additional confirmatory testing may be necessary for epidemiological  and / or clinical management purposes  to differentiate between  SARS-CoV-2 and other Sarbecovirus currently known to infect humans.  If clinically indicated additional testing with an alternate test  methodology [phone]) is advised. The SARS-CoV-2 RNA is generally  detectable in upper and lower respiratory sp ecimens during the acute  phase of infection. The expected result is Negative. Fact Sheet for Patients:  StrictlyIdeas.no Fact Sheet for Healthcare Providers: BankingDealers.co.za This test is not yet approved or cleared by the [place] FDA and has been authorized for  detection and/or diagnosis of SARS-CoV-2 by FDA under an Emergency Use Authorization (EUA).  This EUA will remain in effect (meaning this test can be used) for the duration of the COVID-19 declaration under Section 564(b)(1) of the Act, 21 U.S.C. section 360bbb-3(b)(1), unless the authorization is terminated or revoked sooner. Performed at [facility], [address], [place], [place] [address]   Lactic acid, plasma     Status: None   Collection Time: 04/08/19  6:21 PM  Result Value Ref Range   Lactic Acid, Venous 1.6 0.5 - 1.9 mmol/L    Comment: Performed at [facility], [address], [place], [place] [address]  Troponin I (High Sensitivity)     Status: Abnormal   Collection Time: 04/08/19  6:22 PM  Result Value Ref Range   Troponin I (High Sensitivity) 22 (H) <18 ng/L    Comment: (NOTE) Elevated high sensitivity troponin I  (hsTnI) values and significant  changes across serial measurements may suggest ACS but many other  chronic and acute conditions are known to elevate hsTnI results.  Refer to the "Links" section for chest pain algorithms and additional  guidance. Performed at [facility], [address], [place], [place] [address]   Lactic acid, plasma     Status: None   Collection Time: 04/08/19  9:37 PM  Result Value Ref Range   Lactic Acid, Venous 1.3 0.5 - 1.9 mmol/L    Comment: Performed at [facility], [address], [place], [place] [address]    Chemistries  Recent Labs  Lab 04/08/19 1600  NA 138  K 4.1  CL 99  CO2 25  GLUCOSE 140*  BUN 45*  CREATININE 9.98*  CALCIUM 9.8   ------------------------------------------------------------------------------------------------------------------  ------------------------------------------------------------------------------------------------------------------ GFR: Estimated Creatinine Clearance: 5.5 mL/min (A) (by C-G formula based on SCr of 9.98 mg/dL (H)). Liver Function Tests: No results for input(s): AST, ALT, ALKPHOS, BILITOT, PROT, ALBUMIN in the last 168 hours. No results for input(s): LIPASE, AMYLASE in the last 168 hours. No results for input(s): AMMONIA in the last 168 hours. Coagulation Profile: Recent Labs  Lab 04/08/19 1601  INR 1.8*   Cardiac Enzymes: No results for input(s): CKTOTAL, CKMB, CKMBINDEX, TROPONINI in the last 168 hours. BNP (last 3 results) No results for input(s): PROBNP in the last 8760 hours. HbA1C: No results for input(s): HGBA1C in the last 72 hours. CBG: No results for input(s): GLUCAP in the last 168 hours. Lipid Profile: No results for input(s): CHOL, HDL, LDLCALC, TRIG, CHOLHDL, LDLDIRECT in the last 72 hours. Thyroid Function Tests: No results for input(s): TSH, T4TOTAL, FREET4, T3FREE, THYROIDAB in the last 72 hours. Anemia Panel: No results for input(s): VITAMINB12, FOLATE, FERRITIN, TIBC,  IRON, RETICCTPCT in the last 72 hours.  --------------------------------------------------------------------------------------------------------------- Urine analysis:    Component Value Date/Time   COLORURINE YELLOW 03/03/2012 1500   APPEARANCEUR CLEAR 03/03/2012 1500   LABSPEC 1.020 03/03/2012 1500   PHURINE 6.0 03/03/2012 1500   GLUCOSEU 100 (A) 03/03/2012 1500   HGBUR TRACE (A) 03/03/2012 1500   BILIRUBINUR NEGATIVE 03/03/2012 1500   KETONESUR NEGATIVE 03/03/2012 1500   PROTEINUR 100 (A) 03/03/2012 1500   UROBILINOGEN 0.2 03/03/2012 1500   NITRITE NEGATIVE 03/03/2012 1500   LEUKOCYTESUR NEGATIVE 03/03/2012 1500      Imaging Results:    Dg Chest Port 1 View  Result Date: 04/08/2019 CLINICAL DATA:  Difficulty breathing for past few days, dyspnea on exertion, denies fever, history atrial fibrillation, heart failure, end-stage renal disease on dialysis, systemic hypertension, uterine cancer  EXAM: PORTABLE CHEST 1 VIEW COMPARISON:  Portable exam 1645 hours compared to 08/18/2018 FINDINGS: Enlargement of cardiac silhouette. Mediastinal contours and pulmonary vascularity normal. Atherosclerotic calcification aorta. RIGHT lower lobe consolidation consistent with pneumonia. Remaining lungs clear. No pleural effusion or pneumothorax. Bones demineralized. IMPRESSION: Enlargement of cardiac silhouette. RIGHT lower lobe consolidation consistent with pneumonia. Electronically Signed   By: Lavonia Dana M.D.   On: 04/08/2019 17:18    My personal review of EKG: Atrial fibrillation rate98 /min, QTc 464,no Acute ST changes   Assessment & Plan:    Active Problems:   CAP (community acquired pneumonia)   1. Community-acquired pneumonia 1. Ceftriaxone and Zithromax will be continued per community-acquired pneumonia preferred regimen 2. Monitor pulse ox with vital signs 3. Sputum culture 4. Blood cultures sent by ED 5. Monitor clinical improvement 2. Anemia of chronic disease 1. Anemia is likely  to chronic disease, but it is macrocytic so we will check a folate and B12 3. ESRD 1. Dialysis schedule Monday Wednesday Friday no missed episodes 2. Nephrology consult placed 4. Atrial fibrillation 1. Continue warfarin 5.    DVT Prophylaxis-warfarin and SCDs  AM Labs Ordered, also please review Full Orders  Family Communication: Admission, patients condition and plan of care including tests being ordered have been discussed with the patient who indicates understanding and agrees with plan. Code Status: Full  Admission status: Observation: Based on patients clinical presentation and evaluation of above clinical data, I have made determination that patient meets observation criteria at this time.  Time spent in minutes : 60   Rolla Plate M.D on 04/08/2019 at 11:40 PM

## 2019-04-08 NOTE — ED Notes (Signed)
Report given to [facility] on 761

## 2019-04-09 DIAGNOSIS — I482 Chronic atrial fibrillation, unspecified: Secondary | ICD-10-CM

## 2019-04-09 DIAGNOSIS — D631 Anemia in chronic kidney disease: Secondary | ICD-10-CM | POA: Diagnosis not present

## 2019-04-09 DIAGNOSIS — R0602 Shortness of breath: Secondary | ICD-10-CM

## 2019-04-09 DIAGNOSIS — J189 Pneumonia, unspecified organism: Secondary | ICD-10-CM | POA: Diagnosis not present

## 2019-04-09 DIAGNOSIS — J181 Lobar pneumonia, unspecified organism: Secondary | ICD-10-CM | POA: Diagnosis not present

## 2019-04-09 DIAGNOSIS — Z992 Dependence on renal dialysis: Secondary | ICD-10-CM | POA: Diagnosis not present

## 2019-04-09 DIAGNOSIS — N186 End stage renal disease: Secondary | ICD-10-CM | POA: Diagnosis not present

## 2019-04-09 DIAGNOSIS — I5033 Acute on chronic diastolic (congestive) heart failure: Secondary | ICD-10-CM | POA: Diagnosis not present

## 2019-04-09 DIAGNOSIS — J9601 Acute respiratory failure with hypoxia: Secondary | ICD-10-CM | POA: Diagnosis not present

## 2019-04-09 DIAGNOSIS — I120 Hypertensive chronic kidney disease with stage 5 chronic kidney disease or end stage renal disease: Secondary | ICD-10-CM | POA: Diagnosis not present

## 2019-04-09 DIAGNOSIS — N2581 Secondary hyperparathyroidism of renal origin: Secondary | ICD-10-CM | POA: Diagnosis not present

## 2019-04-09 LAB — CBC WITH DIFFERENTIAL/PLATELET
Abs Immature Granulocytes: 0.01 10*3/uL (ref 0.00–0.07)
Basophils Absolute: 0.1 10*3/uL (ref 0.0–0.1)
Basophils Relative: 1 %
Eosinophils Absolute: 0.1 10*3/uL (ref 0.0–0.5)
Eosinophils Relative: 2 %
HCT: 30.4 % — ABNORMAL LOW (ref 36.0–46.0)
Hemoglobin: 9.8 g/dL — ABNORMAL LOW (ref 12.0–15.0)
Immature Granulocytes: 0 %
Lymphocytes Relative: 19 %
Lymphs Abs: 1.4 10*3/uL (ref 0.7–4.0)
MCH: 35.8 pg — ABNORMAL HIGH (ref 26.0–34.0)
MCHC: 32.2 g/dL (ref 30.0–36.0)
MCV: 110.9 fL — ABNORMAL HIGH (ref 80.0–100.0)
Monocytes Absolute: 0.7 10*3/uL (ref 0.1–1.0)
Monocytes Relative: 10 %
Neutro Abs: 4.8 10*3/uL (ref 1.7–7.7)
Neutrophils Relative %: 68 %
Platelets: 141 10*3/uL — ABNORMAL LOW (ref 150–400)
RBC: 2.74 MIL/uL — ABNORMAL LOW (ref 3.87–5.11)
RDW: 13.5 % (ref 11.5–15.5)
WBC: 7.1 10*3/uL (ref 4.0–10.5)
nRBC: 0 % (ref 0.0–0.2)

## 2019-04-09 LAB — COMPREHENSIVE METABOLIC PANEL
ALT: 22 U/L (ref 0–44)
AST: 13 U/L — ABNORMAL LOW (ref 15–41)
Albumin: 3.2 g/dL — ABNORMAL LOW (ref 3.5–5.0)
Alkaline Phosphatase: 60 U/L (ref 38–126)
Anion gap: 11 (ref 5–15)
BUN: 51 mg/dL — ABNORMAL HIGH (ref 8–23)
CO2: 24 mmol/L (ref 22–32)
Calcium: 9.2 mg/dL (ref 8.9–10.3)
Chloride: 104 mmol/L (ref 98–111)
Creatinine, Ser: 11.14 mg/dL — ABNORMAL HIGH (ref 0.44–1.00)
GFR calc Af Amer: 3 mL/min — ABNORMAL LOW (ref >60)
GFR calc non Af Amer: 3 mL/min — ABNORMAL LOW (ref >60)
Glucose, Bld: 110 mg/dL — ABNORMAL HIGH (ref 70–99)
Potassium: 4.9 mmol/L (ref 3.5–5.1)
Sodium: 139 mmol/L (ref 135–145)
Total Bilirubin: 0.7 mg/dL (ref 0.3–1.2)
Total Protein: 6.1 g/dL — ABNORMAL LOW (ref 6.5–8.1)

## 2019-04-09 LAB — VITAMIN B12: Vitamin B-12: 467 pg/mL (ref 180–914)

## 2019-04-09 MED ORDER — DARBEPOETIN ALFA 40 MCG/0.4ML IJ SOSY
40.0000 ug | PREFILLED_SYRINGE | INTRAMUSCULAR | Status: DC
  Filled 2019-04-09: qty 0.4

## 2019-04-09 MED ORDER — EPOETIN ALFA 3000 UNIT/ML IJ SOLN
2800.0000 [IU] | INTRAMUSCULAR | Status: DC
  Administered 2019-04-09 – 2019-04-23 (×7): 2800 [IU] via INTRAVENOUS
  Filled 2019-04-09 (×12): qty 1

## 2019-04-09 MED ORDER — ALBUMIN HUMAN 25 % IV SOLN
25.0000 g | INTRAVENOUS | Status: AC
  Administered 2019-04-09 (×2): 25 g via INTRAVENOUS
  Filled 2019-04-09: qty 200
  Filled 2019-04-09 (×2): qty 50

## 2019-04-09 MED ORDER — PENTAFLUOROPROP-TETRAFLUOROETH EX AERO: 1.0000 "application " | INHALATION_SPRAY | CUTANEOUS | Status: DC | PRN

## 2019-04-09 MED ORDER — SODIUM CHLORIDE 0.9 % IV SOLN
1.0000 g | Freq: Once | INTRAVENOUS | Status: DC
  Filled 2019-04-09: qty 1

## 2019-04-09 MED ORDER — LIDOCAINE-PRILOCAINE 2.5-2.5 % EX CREA: 1.0000 "application " | TOPICAL_CREAM | CUTANEOUS | Status: DC | PRN

## 2019-04-09 MED ORDER — SODIUM CHLORIDE 0.9 % IV SOLN
1.0000 g | INTRAVENOUS | Status: DC
  Administered 2019-04-09 – 2019-04-12 (×4): 1 g via INTRAVENOUS
  Filled 2019-04-09 (×6): qty 1

## 2019-04-09 MED ORDER — VANCOMYCIN HCL IN DEXTROSE 1-5 GM/200ML-% IV SOLN: 1000.0000 mg | INTRAVENOUS | Status: DC

## 2019-04-09 MED ORDER — DOXERCALCIFEROL 4 MCG/2ML IV SOLN
8.0000 ug | INTRAVENOUS | Status: DC
  Administered 2019-04-09 – 2019-04-14 (×3): 8 ug via INTRAVENOUS
  Filled 2019-04-09 (×3): qty 4

## 2019-04-09 MED ORDER — SODIUM CHLORIDE 0.9 % IV SOLN: 100.0000 mL | INTRAVENOUS | Status: DC | PRN

## 2019-04-09 MED ORDER — CHLORHEXIDINE GLUCONATE CLOTH 2 % EX PADS: 6.0000 | MEDICATED_PAD | Freq: Every day | CUTANEOUS | Status: DC

## 2019-04-09 MED ORDER — DEXTROSE 5 % IV SOLN: 500.0000 mg | INTRAVENOUS | Status: DC

## 2019-04-09 MED ORDER — LIDOCAINE HCL (PF) 1 % IJ SOLN: 5.0000 mL | INTRAMUSCULAR | Status: DC | PRN

## 2019-04-09 MED ORDER — VANCOMYCIN HCL IN DEXTROSE 1-5 GM/200ML-% IV SOLN
1000.0000 mg | INTRAVENOUS | Status: AC
  Administered 2019-04-09 (×2): 1000 mg via INTRAVENOUS

## 2019-04-09 NOTE — Progress Notes (Signed)
PHARMACY NOTE:  ANTIMICROBIAL RENAL DOSAGE ADJUSTMENT  Current antimicrobial regimen includes a mismatch between antimicrobial dosage and estimated renal function.  As per policy approved by the Pharmacy & Therapeutics and Medical Executive Committees, the antimicrobial dosage will be adjusted accordingly.  Current antimicrobial dosage:  Cefepime 1g Q8H  Indication: Pneumonia   Renal Function:  Estimated Creatinine Clearance: 5 mL/min (A) (by C-G formula based on SCr of 11.14 mg/dL (H)). [x]      On intermittent HD, scheduled:MWF []      On CRRT    Antimicrobial dosage has been changed to:  Cefepime 1g x 1 dose followed by 500 mg Q24H  Additional comments:   Thank you for allowing pharmacy to be a part of this patient's care.  Signa Kell, [facility] 04/09/2019 5:42 PM

## 2019-04-09 NOTE — Procedures (Signed)
    HEMODIALYSIS TREATMENT NOTE:  4 hour heparin-free dialysis treatment completed via left forearm AVF (15g/antegrade). Goal NOT met: Unable to tolerate removal of 3-4L as ordered, despite administration of Albumin 25g x2.    UF was interrupted x 30 min and UF rate had to be repeatedly decreased in response to SBP <100.  Net UF 2 L.  All blood was returned and hemostasis was achieved in 15 minutes.  Rockwell Alexandria, RN

## 2019-04-09 NOTE — Progress Notes (Signed)
Flutter valve left in room, patient receiving dialysis at this time.

## 2019-04-09 NOTE — Progress Notes (Signed)
PT. CURRENTLY RECEIVING DIALYSIS AT THE BEDSIDE, O2@2L , REMAINS SOB WITH EXERTION. 1+ PITTING EDEMA NOTED TO BLE,DENIES C/O DISCOMFORT.

## 2019-04-09 NOTE — Care Management Obs Status (Signed)
[place] NOTIFICATION   Patient Details  Name: Sabrina Beck MRN: [id number] Date of Birth: [birth year]   Medicare Observation Status Notification Given:  Yes    Tommy Medal 04/09/2019, 2:47 PM

## 2019-04-09 NOTE — Consult Note (Signed)
[facility] Renal Consultation Note    Indication for Consultation:  Management of ESRD/hemodialysis; anemia, hypertension/volume and secondary hyperparathyroidism  HPI: Sabrina Beck is a 78 y.o. female.  Patient has a PMH significant for HTN, A Fib (on coumadin), dCHF, obesity, uterine cancer, peripheral neuropathy, and ESRD (MWF at [facility]) who presented to [facility] ED with 5 day history of progressive DOE.  She has been compliant with HD and has been leaving at her edw.  She denies any CP, coughing, fever, N/V/D.  In ED CXR revealed RLL consolidation and she was admitted for IV abx for HCAP.  We were consulted to help manage her HD and ESRD-related disease processes.  Past Medical History:  Diagnosis Date  . Anemia   . Atrial fibrillation ([place])   . Chest pain 05/15/2006   Stress test negative for ischemia  . Diastolic heart failure ([place])   . Diverticulosis   . ESRD on hemodialysis ([place])   . GERD (gastroesophageal reflux disease)   . Headache   . Mixed hyperlipidemia   . Peripheral neuropathy   . PONV (postoperative nausea and vomiting)   . Systemic hypertension   . Uterine cancer [facility])    Past Surgical History:  Procedure Laterality Date  . ABDOMINAL HYSTERECTOMY    . APPENDECTOMY    . AV FISTULA PLACEMENT Left 11/17/2013   Procedure: CREATION OF LEFT RADIOCEPHALIC ARTERIOVENOUS (AV) FISTULA ;  Surgeon: [name], MD;  Location: [place];  Service: Vascular;  Laterality: Left;  . BREAST SURGERY Left    tumors non canerous 2- 3 different surgeries  . CATARACT EXTRACTION W/PHACO Right 10/26/2016   Procedure: CATARACT EXTRACTION PHACO AND INTRAOCULAR LENS PLACEMENT (IOC) CDE= 11.91;  Surgeon: Tonny Branch, MD;  Location: [facility];  Service: Ophthalmology;  Laterality: Right;  right - pt knows to arrive at 6:30  . CATARACT EXTRACTION W/PHACO Left 11/30/2016   Procedure: CATARACT EXTRACTION PHACO AND INTRAOCULAR LENS PLACEMENT (IOC) CDE - 7.92 ;  Surgeon: Tonny Branch, MD;  Location: [facility];  Service: Ophthalmology;  Laterality: Left;  left  . CHOLECYSTECTOMY    . COLONOSCOPY W/ BIOPSIES AND POLYPECTOMY    . FISTULA SUPERFICIALIZATION Left 05/19/2014   Procedure: FISTULA SUPERFICIALIZATION-LEFT ARM;  Surgeon: [name], MD;  Location: [place];  Service: Vascular;  Laterality: Left;  . INSERTION OF DIALYSIS CATHETER N/A 01/24/2014   Procedure: INSERTION OF DIALYSIS CATHETER;  Surgeon: Rosetta Posner, MD;  Location: [facility] OR;  Service: Vascular;  Laterality: N/A;  . KNEE ARTHROSCOPY    . TUBAL LIGATION     Family History:   Family History  Problem Relation Age of Onset  . Diabetes Mother   . Hypertension Mother   . Diabetes Father   . Heart disease Father        before age 60  . Heart attack Father   . Diabetes Sister   . Hyperlipidemia Sister   . Hypertension Sister   . Cancer Brother   . Diabetes Brother   . Heart disease Brother   . Hypertension Brother   . Heart attack Brother    Social History:  reports that she quit smoking about 19 years ago. She has never used smokeless tobacco. She reports that she does not drink alcohol or use drugs. No Known Allergies Prior to Admission medications   Medication Sig Start Date End Date Taking? Authorizing Provider  acetaminophen (TYLENOL) 500 MG tablet Take 1,000 mg by mouth every 6 (six) hours as  needed for moderate pain.    Yes [provider]  Cholecalciferol (VITAMIN D) 2000 UNITS CAPS Take 2,000 Units by mouth every evening.    Yes [provider]  gabapentin (NEURONTIN) 100 MG capsule Take 100 mg by mouth 2 (two) times daily.  09/13/18  Yes [provider]  metoprolol tartrate (LOPRESSOR) 50 MG tablet Take 1 tablet (50 mg total) by mouth 2 (two) times daily. Patient taking differently: Take 50 mg by mouth See admin instructions. Take one tablet every day at bedtime. Take one tablet every day in the morning on days that are non-dialysis days (T-TH-S-S) 10/15/18  Yes  Lendon Colonel, NP  Multiple Vitamins-Minerals (PRESERVISION AREDS 2 PO) Take 1 tablet by mouth 2 (two) times daily.   Yes [provider]  multivitamin (RENA-VIT) TABS tablet Take 1 tablet by mouth every evening.  07/06/18  Yes [provider]  Omega-3 Fatty Acids (OMEGA-3 FISH OIL) 300 MG CAPS Take 300 mg by mouth 2 (two) times daily.   Yes [provider]  omeprazole (PRILOSEC) 20 MG capsule Take 20 mg by mouth every morning.    Yes [provider]  Propylene Glycol (SYSTANE BALANCE) 0.6 % SOLN Place 1 drop into both eyes 3 (three) times daily as needed (for burning or dry eyes).    Yes [provider]  RENVELA 800 MG tablet Take 2,400 mg by mouth 3 (three) times daily with meals. Takes 1 tablet with snacks 04/17/15  Yes [provider]  warfarin (COUMADIN) 5 MG tablet Take 1.5 to 2 tablets by mouth daily as directed by coumadin clinic Patient taking differently: Take 7.5-10 mg by mouth See admin instructions. 10mg  (10mg  total) on Mondays, then take 7.5mg  (1.5 tablets) on all other days in the evening. 10/15/18  Yes Lendon Colonel, NP   Current Facility-Administered Medications  Medication Dose Route Frequency Provider Last Rate Last Dose  . 0.9 %  sodium chloride infusion   Intravenous Continuous Zierle-Ghosh, Asia B, DO 50 mL/hr at 04/09/19 0005    . acetaminophen (TYLENOL) tablet 1,000 mg  1,000 mg Oral Q6H PRN Zierle-Ghosh, Asia B, DO      . azithromycin (ZITHROMAX) 500 mg in sodium chloride 0.9 % 250 mL IVPB  500 mg Intravenous Q24H Zierle-Ghosh, Asia B, DO      . cefTRIAXone (ROCEPHIN) 2 g in sodium chloride 0.9 % 100 mL IVPB  2 g Intravenous Q24H Zierle-Ghosh, Asia B, DO      . cholecalciferol (VITAMIN D3) tablet 2,000 Units  2,000 Units Oral QPM Zierle-Ghosh, Asia B, DO      . gabapentin (NEURONTIN) capsule 100 mg  100 mg Oral BID Zierle-Ghosh, Asia B, DO   100 mg at 04/08/19 2358  . metoprolol tartrate (LOPRESSOR) tablet 50  mg  50 mg Oral QHS Zierle-Ghosh, Asia B, DO   50 mg at 04/08/19 2358  . [START ON 04/10/2019] metoprolol tartrate (LOPRESSOR) tablet 50 mg  50 mg Oral Once per day on Sun Tue Thu Sat Zierle-Ghosh, [place] B, DO      . pantoprazole (PROTONIX) EC tablet 40 mg  40 mg Oral Daily Zierle-Ghosh, Asia B, DO      . sevelamer carbonate (RENVELA) tablet 2,400 mg  2,400 mg Oral TID WC Zierle-Ghosh, Asia B, DO      . [START ON 04/14/2019] warfarin (COUMADIN) tablet 10 mg  10 mg Oral Q Mon-1800 Zierle-Ghosh, Asia B, DO      . warfarin (COUMADIN) tablet 7.5 mg  7.5 mg Oral Once per day on Sun Tue Wed Thu Fri Sat Zierle-Ghosh, [place] B, DO   7.5 mg at 04/08/19 2359  . Warfarin - Physician Dosing Inpatient   Does not apply q1800 Zierle-Ghosh, Asia B, DO       Labs: Basic Metabolic Panel: Recent Labs  Lab 04/08/19 1600 04/09/19 0558  NA 138 139  K 4.1 4.9  CL 99 104  CO2 25 24  GLUCOSE 140* 110*  BUN 45* 51*  CREATININE 9.98* 11.14*  CALCIUM 9.8 9.2   Liver Function Tests: Recent Labs  Lab 04/09/19 0558  AST 13*  ALT 22  ALKPHOS 60  BILITOT 0.7  PROT 6.1*  ALBUMIN 3.2*   No results for input(s): LIPASE, AMYLASE in the last 168 hours. No results for input(s): AMMONIA in the last 168 hours. CBC: Recent Labs  Lab 04/08/19 1600 04/09/19 0558  WBC 8.0 7.1  NEUTROABS  --  4.8  HGB 11.0* 9.8*  HCT 33.9* 30.4*  MCV 110.1* 110.9*  PLT 146* 141*   Cardiac Enzymes: No results for input(s): CKTOTAL, CKMB, CKMBINDEX, TROPONINI in the last 168 hours. CBG: No results for input(s): GLUCAP in the last 168 hours. Iron Studies: No results for input(s): IRON, TIBC, TRANSFERRIN, FERRITIN in the last 72 hours. Studies/Results: Dg Chest Port 1 View  Result Date: 04/08/2019 CLINICAL DATA:  Difficulty breathing for past few days, dyspnea on exertion, denies fever, history atrial fibrillation, heart failure, end-stage renal disease on dialysis, systemic hypertension, uterine cancer EXAM: PORTABLE CHEST 1 VIEW  COMPARISON:  Portable exam 1645 hours compared to 08/18/2018 FINDINGS: Enlargement of cardiac silhouette. Mediastinal contours and pulmonary vascularity normal. Atherosclerotic calcification aorta. RIGHT lower lobe consolidation consistent with pneumonia. Remaining lungs clear. No pleural effusion or pneumothorax. Bones demineralized. IMPRESSION: Enlargement of cardiac silhouette. RIGHT lower lobe consolidation consistent with pneumonia. Electronically Signed   By: Lavonia Dana M.D.   On: 04/08/2019 17:18    ROS: Pertinent items are noted in HPI. Physical Exam: Vitals:   04/08/19 2301 04/08/19 2333 04/09/19 0543 04/09/19 0814  BP:  134/87 115/65   Pulse:  91 95   Resp:  18 18   Temp:  98.7 F (37.1 C) 98.9 F (37.2 C)   TempSrc:  Oral Oral   SpO2: 92% (!) 86% 97% 96%  Weight:      Height:          Weight change:   Intake/Output Summary (Last 24 hours) at 04/09/2019 1058 Last data filed at 04/09/2019 0400 Gross per 24 hour  Intake 573.49 ml  Output -  Net 573.49 ml   BP 115/65 (BP Location: Right Arm)   Pulse 95   Temp 98.9 F (37.2 C) (Oral)   Resp 18   Ht 5\' 4"  (1.626 m)   Wt 109.8 kg   SpO2 96%   BMI 41.54 kg/m  General appearance: fatigued, no distress and slowed mentation Head: Normocephalic, without obvious abnormality, atraumatic Resp: rales bibasilar Cardio: irregularly irregular rhythm and no rub GI: soft, non-tender; bowel sounds normal; no masses,  no organomegaly Extremities: edema trace pretibial edema and LAVF +T/B Dialysis Access:  Dialysis Orders: Center: [facility]  on MWF . EDW 103 HD Bath 2K/2.5Ca  Time 3.5 hrs Heparin none. Access LAVF BFR 400 DFR 600    Hectoral 9.5 mcg IV/HD Epogen 2,800   Units IV/HD   Assessment/Plan: 1.  DOE and possible HCAP- will plan for HD with UF and follow respiratory response.  Agree with  treating for HCAP for now and follow 2.  ESRD -   Plan for HD today to keep on schedule. 3.  Hypertension/volume  - has  crackles at bases and trace pretibial edema.  Weight is up 6.8 kg but doubt she is that overloaded as she does not have a history of large idwg per her primary Nephrology PA.  Plan for uf as bp tolerates today and weigh standing up if able 4.  Anemia  -  Cont with ESA and follow.  Hgb dropped  5.  Metabolic bone disease -  Cont with home meds 6.  Nutrition - renal diet  Donetta Potts, MD [place] Pager [phone] 04/09/2019, 10:58 AM

## 2019-04-09 NOTE — Progress Notes (Signed)
PROGRESS NOTE    Sabrina Beck  ZHG:[id number] DOB: November 16, 1940 DOA: 04/08/2019 PCP: Redmond School, MD     Brief Narrative:  78 y.o. 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  Patient had been getting progressively short of breath over 5 days.  She reports that it felt like she just could not take a deep breath in.  Patient denies any chest pain, coughing, fever, dizziness.  Patient has been admitted for pneumonia before in January 2020.  This episode feels like that episode only not as bad.  Patient reports no sick contacts.  Covid test negative.  ED course Chemistry shows a glucose of 140, BUN of 45, creatinine of 9.98, and GFR of 3.  Patient has a BNP of 344.  Initial troponin is 20.  Hematology reveals no leukocytosis, but a macrocytic anemia with a hemoglobin of 11.  Chest x-ray was done that shows enlargement of cardiac silhouette.  Right lower lobe consolidation consistent with pneumonia.  Blood cultures drawn.  Lactic acid 1.6.  Troponin stable at 22.  Second lactic acid 1.3.   Assessment & Plan: 1-shortness of breath and hypoxia: In the setting of healthcare associated pneumonia and presumed acute on chronic diastolic heart failure. -Continue IV antibiotics, transitioning to vancomycin and cefepime. -Nephrology service contacted to assist with decreasing her dry weight and attempting to improve vascular congestion. -Continue DuoNeb -Follow culture results -Continue Mucinex -Continue weaning oxygen supplementation as tolerated -Start flutter valve.  2-end-stage renal disease -Continue hemodialysis -Follow nephrology recommendations.  3-chronic atrial fibrillation -Rate controlled and stable -Continue metoprolol for rate management and continue warfarin for secondary prevention  -Monitoring on telemetry. -Patient reports no palpitations.  4-secondary hyperparathyroidism -Continue Renvela  5-anemia of chronic disease -Associated with chronic renal failure -Follow recommendations by nephrology for IV iron and Epogen therapy. -Normal B12 level.  6-morbid obesity -Low calorie diet, portion control and increase physical activity discussed with patient -Body mass index is 41.74 kg/m.  7-gastroesophageal reflux disease -Continue PPI.  8-hypertension -Stable and well-controlled -Continue metoprolol   DVT prophylaxis: Chronically on warfarin Code Status: Full code Family Communication: Son at bedside Disposition Plan: Remains inpatient, adjust antibiotic therapy to cover for H CAP; discussed with nephrology service to challenge patient on a lower dry weight given concern for vascular: Congestion in her lungs.  Continue PRN duo nebs and start flutter valve.  Continue to wean oxygen supplementation as tolerated.  Consultants:   Nephrology service  Procedures:   See below for x-ray reports  Antimicrobials:  Anti-infectives (From admission, onward)   Start     Dose/Rate Route Frequency Ordered Stop   04/09/19 1900  azithromycin (ZITHROMAX) 500 mg in sodium chloride 0.9 % 250 mL IVPB     500 mg 250 mL/hr over 60 Minutes Intravenous Every 24 hours 04/08/19 2300 04/14/19 1859   04/09/19 1800  cefTRIAXone (ROCEPHIN) 2 g in sodium chloride 0.9 % 100 mL IVPB  Status:  Discontinued     2 g 200 mL/hr over 30 Minutes Intravenous Every 24 hours 04/08/19 2300 04/09/19 1725   04/09/19 1730  ceFEPIme (MAXIPIME) 1 g in sodium chloride 0.9 % 100 mL IVPB     1 g 200 mL/hr over 30 Minutes Intravenous Every 8 hours 04/09/19 1725     04/08/19 1800  cefTRIAXone (ROCEPHIN) 1 g in sodium chloride 0.9 % 100 mL  IVPB     1 g 200 mL/hr over 30 Minutes Intravenous  Once 04/08/19 1750 04/08/19 2035   04/08/19 1800  azithromycin (ZITHROMAX) 500 mg in sodium chloride 0.9 % 250 mL  IVPB     500 mg 250 mL/hr over 60 Minutes Intravenous  Once 04/08/19 1750 04/08/19 2035       Subjective: Afebrile, no chest pain, no nausea, no vomiting.  Continues to require oxygen supplementation and is feeling short of breath.  Patient also expressing dry intermittent coughing spells.  Objective: Vitals:   04/09/19 1630 04/09/19 1645 04/09/19 1700 04/09/19 1715  BP: (!) 104/57 (!) 90/49 99/63 (!) 98/57  Pulse: 93 83 84 91  Resp:      Temp:      TempSrc:      SpO2:      Weight:      Height:        Intake/Output Summary (Last 24 hours) at 04/09/2019 1725 Last data filed at 04/09/2019 0400 Gross per 24 hour  Intake 573.49 ml  Output -  Net 573.49 ml   Filed Weights   04/08/19 1501 04/09/19 1545  Weight: 109.8 kg 110.3 kg    Examination: General exam: Alert, awake, oriented x 3; currently afebrile.  Reports no chest pain, no nausea, no vomiting.  Patient remains short of breath and having intermittent dry coughing spells.  Continue requiring 2 L oxygen supplementation to maintain O2 sats above 90%. Respiratory system: Decreased breath sounds at the bases, positive rhonchi right, no using accessory muscles. Cardiovascular system:Rate controlled,  No rubs or gallops.  Unable to properly assess JVD due to body habitus. Gastrointestinal system: Abdomen is obese, nondistended, soft and nontender. No organomegaly or masses felt. Normal bowel sounds heard. Central nervous system: Alert and oriented. No focal neurological deficits. Extremities: No cyanosis or clubbing.  Trace edema bilaterally.  Left forearm AV fistula in place with good fluids. Skin: No rashes, no petechiae, no open wounds. Psychiatry: Judgement and insight appear normal. Mood & affect appropriate.     Data Reviewed: I have personally reviewed following labs and imaging studies  CBC: Recent Labs  Lab 04/08/19 1600 04/09/19 0558  WBC 8.0 7.1  NEUTROABS  --  4.8  HGB 11.0* 9.8*  HCT 33.9* 30.4*  MCV  110.1* 110.9*  PLT 146* 604*   Basic Metabolic Panel: Recent Labs  Lab 04/08/19 1600 04/09/19 0558  NA 138 139  K 4.1 4.9  CL 99 104  CO2 25 24  GLUCOSE 140* 110*  BUN 45* 51*  CREATININE 9.98* 11.14*  CALCIUM 9.8 9.2   GFR: Estimated Creatinine Clearance: 5 mL/min (A) (by C-G formula based on SCr of 11.14 mg/dL (H)).   Liver Function Tests: Recent Labs  Lab 04/09/19 0558  AST 13*  ALT 22  ALKPHOS 60  BILITOT 0.7  PROT 6.1*  ALBUMIN 3.2*   Coagulation Profile: Recent Labs  Lab 04/08/19 1601  INR 1.8*   Anemia Panel: Recent Labs    04/09/19 0558  VITAMINB12 467   Urine analysis:    Component Value Date/Time   COLORURINE YELLOW 03/03/2012 1500   APPEARANCEUR CLEAR 03/03/2012 1500   LABSPEC 1.020 03/03/2012 1500   PHURINE 6.0 03/03/2012 1500   GLUCOSEU 100 (A) 03/03/2012 1500   HGBUR TRACE (A) 03/03/2012 1500   BILIRUBINUR NEGATIVE 03/03/2012 1500   KETONESUR NEGATIVE 03/03/2012 1500   PROTEINUR 100 (A) 03/03/2012 1500   UROBILINOGEN 0.2 03/03/2012 1500   NITRITE NEGATIVE 03/03/2012 1500  LEUKOCYTESUR NEGATIVE 03/03/2012 1500    Recent Results (from the past 240 hour(s))  SARS Coronavirus 2 [facility] order, Performed in [facility] hospital lab) Nasopharyngeal Nasopharyngeal Swab     Status: None   Collection Time: 04/08/19  5:50 PM   Specimen: Nasopharyngeal Swab  Result Value Ref Range Status   SARS Coronavirus 2 NEGATIVE NEGATIVE Final    Comment: (NOTE) If result is NEGATIVE SARS-CoV-2 target nucleic acids are NOT DETECTED. The SARS-CoV-2 RNA is generally detectable in upper and lower  respiratory specimens during the acute phase of infection. The lowest  concentration of SARS-CoV-2 viral copies this assay can detect is 250  copies / mL. A negative result does not preclude SARS-CoV-2 infection  and should not be used as the sole basis for treatment or other  patient management decisions.  A negative result may occur with  improper specimen  collection / handling, submission of specimen other  than nasopharyngeal swab, presence of viral mutation(s) within the  areas targeted by this assay, and inadequate number of viral copies  (<250 copies / mL). A negative result must be combined with clinical  observations, patient history, and epidemiological information. If result is POSITIVE SARS-CoV-2 target nucleic acids are DETECTED. The SARS-CoV-2 RNA is generally detectable in upper and lower  respiratory specimens dur ing the acute phase of infection.  Positive  results are indicative of active infection with SARS-CoV-2.  Clinical  correlation with patient history and other diagnostic information is  necessary to determine patient infection status.  Positive results do  not rule out bacterial infection or co-infection with other viruses. If result is PRESUMPTIVE POSTIVE SARS-CoV-2 nucleic acids MAY BE PRESENT.   A presumptive positive result was obtained on the submitted specimen  and confirmed on repeat testing.  While 2019 novel coronavirus  (SARS-CoV-2) nucleic acids may be present in the submitted sample  additional confirmatory testing may be necessary for epidemiological  and / or clinical management purposes  to differentiate between  SARS-CoV-2 and other Sarbecovirus currently known to infect humans.  If clinically indicated additional testing with an alternate test  methodology [phone]) is advised. The SARS-CoV-2 RNA is generally  detectable in upper and lower respiratory sp ecimens during the acute  phase of infection. The expected result is Negative. Fact Sheet for Patients:  StrictlyIdeas.no Fact Sheet for Healthcare Providers: BankingDealers.co.za This test is not yet approved or cleared by the [place] FDA and has been authorized for detection and/or diagnosis of SARS-CoV-2 by FDA under an Emergency Use Authorization (EUA).  This EUA will remain in effect  (meaning this test can be used) for the duration of the COVID-19 declaration under Section 564(b)(1) of the Act, 21 U.S.C. section 360bbb-3(b)(1), unless the authorization is terminated or revoked sooner. Performed at [facility], [address] [place], [place] [address]   Culture, blood (routine x 2)     Status: None (Preliminary result)   Collection Time: 04/08/19  6:21 PM   Specimen: BLOOD  Result Value Ref Range Status   Specimen Description BLOOD RIGHT ANTECUBITAL  Final   Special Requests   Final    BOTTLES DRAWN AEROBIC AND ANAEROBIC Blood Culture adequate volume   Culture   Final    NO GROWTH < 24 HOURS Performed at [facility], [address], [place],  [address]    Report Status PENDING  Incomplete  Culture, blood (routine x 2)     Status: None (Preliminary result)   Collection Time: 04/08/19  6:22  PM   Specimen: BLOOD  Result Value Ref Range Status   Specimen Description BLOOD RIGHT ANTECUBITAL  Final   Special Requests   Final    BOTTLES DRAWN AEROBIC AND ANAEROBIC Blood Culture adequate volume   Culture   Final    NO GROWTH < 24 HOURS Performed at [facility], [address] [place], [place] [address]    Report Status PENDING  Incomplete    Radiology Studies: Dg Chest Port 1 View  Result Date: 04/08/2019 CLINICAL DATA:  Difficulty breathing for past few days, dyspnea on exertion, denies fever, history atrial fibrillation, heart failure, end-stage renal disease on dialysis, systemic hypertension, uterine cancer EXAM: PORTABLE CHEST 1 VIEW COMPARISON:  Portable exam 1645 hours compared to 08/18/2018 FINDINGS: Enlargement of cardiac silhouette. Mediastinal contours and pulmonary vascularity normal. Atherosclerotic calcification aorta. RIGHT lower lobe consolidation consistent with pneumonia. Remaining lungs clear. No pleural effusion or pneumothorax. Bones demineralized. IMPRESSION: Enlargement of cardiac silhouette. RIGHT lower lobe consolidation consistent with  pneumonia. Electronically Signed   By: Lavonia Dana M.D.   On: 04/08/2019 17:18    Scheduled Meds: . cholecalciferol  2,000 Units Oral QPM  . doxercalciferol  8 mcg Intravenous Q M,W,F-HD  . epoetin (EPOGEN/PROCRIT) injection  2,800 Units Intravenous Q M,W,F-HD  . gabapentin  100 mg Oral BID  . metoprolol tartrate  50 mg Oral QHS  . [START ON 04/10/2019] metoprolol tartrate  50 mg Oral Once per day on Sun Tue Thu Sat  . pantoprazole  40 mg Oral Daily  . sevelamer carbonate  2,400 mg Oral TID WC  . [START ON 04/14/2019] warfarin  10 mg Oral Q Mon-1800  . warfarin  7.5 mg Oral Once per day on Sun Tue Wed Thu Fri Sat  . Warfarin - Physician Dosing Inpatient   Does not apply q1800   Continuous Infusions: . sodium chloride 50 mL/hr (04/09/19 1530)  . sodium chloride    . sodium chloride    . albumin human    . azithromycin    . ceFEPime (MAXIPIME) IV       LOS: 0 days    Time spent: 35 minutes    Barton Dubois, MD [facility] Pager [phone]   04/09/2019, 5:25 PM

## 2019-04-09 NOTE — Progress Notes (Signed)
Pharmacy Antibiotic Note  Sabrina Beck is a 78 y.o. female admitted on 04/08/2019 with pneumonia.  Pharmacy has been consulted for Vancomycin dosing.  Plan: Vancomycin 2000mg  loading dose, then 1000mg  after each HD session Also on cefepime 1gm IV q24h Azithromycin 500mg  IV q24h F/U cxs and clinical progress Monitor V/S, labs and levels as indicated  Height: 5\' 4"  (162.6 cm) Weight: 243 lb 2.7 oz (110.3 kg) IBW/kg (Calculated) : 54.7  Temp (24hrs), Avg:98.8 F (37.1 C), Min:98.7 F (37.1 C), Max:98.9 F (37.2 C)  Recent Labs  Lab 04/08/19 1600 04/08/19 1821 04/08/19 2137 04/09/19 0558  WBC 8.0  --   --  7.1  CREATININE 9.98*  --   --  11.14*  LATICACIDVEN  --  1.6 1.3  --     Estimated Creatinine Clearance: 5 mL/min (A) (by C-G formula based on SCr of 11.14 mg/dL (H)).    No Known Allergies  Antimicrobials this admission: Cefepime 9/2>> Vancomycin 9/2 >> Azithromycin 9/2>> Ceftriaxone 1gm IV x 1 in ED 9/2  Dose adjustments this admission: F/u vanc/cefepime  Microbiology results: 9/1 BCx: pending  MRSA PCR:   Thank you for allowing pharmacy to be a part of this patient's care.  Isac Sarna, BS [place], [place] Clinical Pharmacist Pager [phone] 04/09/2019 5:47 PM

## 2019-04-10 ENCOUNTER — Observation Stay (HOSPITAL_COMMUNITY): Payer: Medicare HMO

## 2019-04-10 DIAGNOSIS — K559 Vascular disorder of intestine, unspecified: Secondary | ICD-10-CM | POA: Diagnosis not present

## 2019-04-10 DIAGNOSIS — E782 Mixed hyperlipidemia: Secondary | ICD-10-CM | POA: Diagnosis not present

## 2019-04-10 DIAGNOSIS — I5033 Acute on chronic diastolic (congestive) heart failure: Secondary | ICD-10-CM | POA: Diagnosis not present

## 2019-04-10 DIAGNOSIS — K5731 Diverticulosis of large intestine without perforation or abscess with bleeding: Secondary | ICD-10-CM | POA: Diagnosis not present

## 2019-04-10 DIAGNOSIS — I5043 Acute on chronic combined systolic (congestive) and diastolic (congestive) heart failure: Secondary | ICD-10-CM | POA: Diagnosis not present

## 2019-04-10 DIAGNOSIS — Z515 Encounter for palliative care: Secondary | ICD-10-CM | POA: Diagnosis not present

## 2019-04-10 DIAGNOSIS — Z452 Encounter for adjustment and management of vascular access device: Secondary | ICD-10-CM | POA: Diagnosis not present

## 2019-04-10 DIAGNOSIS — I4821 Permanent atrial fibrillation: Secondary | ICD-10-CM | POA: Diagnosis not present

## 2019-04-10 DIAGNOSIS — I959 Hypotension, unspecified: Secondary | ICD-10-CM | POA: Diagnosis not present

## 2019-04-10 DIAGNOSIS — J90 Pleural effusion, not elsewhere classified: Secondary | ICD-10-CM | POA: Diagnosis not present

## 2019-04-10 DIAGNOSIS — Y95 Nosocomial condition: Secondary | ICD-10-CM | POA: Diagnosis not present

## 2019-04-10 DIAGNOSIS — I482 Chronic atrial fibrillation, unspecified: Secondary | ICD-10-CM | POA: Diagnosis not present

## 2019-04-10 DIAGNOSIS — N186 End stage renal disease: Secondary | ICD-10-CM | POA: Diagnosis not present

## 2019-04-10 DIAGNOSIS — D638 Anemia in other chronic diseases classified elsewhere: Secondary | ICD-10-CM | POA: Diagnosis not present

## 2019-04-10 DIAGNOSIS — J189 Pneumonia, unspecified organism: Secondary | ICD-10-CM | POA: Diagnosis not present

## 2019-04-10 DIAGNOSIS — I5042 Chronic combined systolic (congestive) and diastolic (congestive) heart failure: Secondary | ICD-10-CM | POA: Diagnosis not present

## 2019-04-10 DIAGNOSIS — J181 Lobar pneumonia, unspecified organism: Secondary | ICD-10-CM | POA: Diagnosis not present

## 2019-04-10 DIAGNOSIS — D539 Nutritional anemia, unspecified: Secondary | ICD-10-CM | POA: Diagnosis present

## 2019-04-10 DIAGNOSIS — G459 Transient cerebral ischemic attack, unspecified: Secondary | ICD-10-CM | POA: Diagnosis not present

## 2019-04-10 DIAGNOSIS — K5900 Constipation, unspecified: Secondary | ICD-10-CM | POA: Diagnosis not present

## 2019-04-10 DIAGNOSIS — D631 Anemia in chronic kidney disease: Secondary | ICD-10-CM | POA: Diagnosis not present

## 2019-04-10 DIAGNOSIS — J9601 Acute respiratory failure with hypoxia: Secondary | ICD-10-CM | POA: Diagnosis not present

## 2019-04-10 DIAGNOSIS — Z992 Dependence on renal dialysis: Secondary | ICD-10-CM | POA: Diagnosis not present

## 2019-04-10 DIAGNOSIS — R5381 Other malaise: Secondary | ICD-10-CM | POA: Diagnosis present

## 2019-04-10 DIAGNOSIS — I120 Hypertensive chronic kidney disease with stage 5 chronic kidney disease or end stage renal disease: Secondary | ICD-10-CM | POA: Diagnosis not present

## 2019-04-10 DIAGNOSIS — Z7901 Long term (current) use of anticoagulants: Secondary | ICD-10-CM | POA: Diagnosis not present

## 2019-04-10 DIAGNOSIS — D689 Coagulation defect, unspecified: Secondary | ICD-10-CM | POA: Diagnosis not present

## 2019-04-10 DIAGNOSIS — D688 Other specified coagulation defects: Secondary | ICD-10-CM | POA: Diagnosis not present

## 2019-04-10 DIAGNOSIS — Z20828 Contact with and (suspected) exposure to other viral communicable diseases: Secondary | ICD-10-CM | POA: Diagnosis not present

## 2019-04-10 DIAGNOSIS — A419 Sepsis, unspecified organism: Secondary | ICD-10-CM | POA: Diagnosis not present

## 2019-04-10 DIAGNOSIS — N2581 Secondary hyperparathyroidism of renal origin: Secondary | ICD-10-CM | POA: Diagnosis not present

## 2019-04-10 DIAGNOSIS — K219 Gastro-esophageal reflux disease without esophagitis: Secondary | ICD-10-CM | POA: Diagnosis present

## 2019-04-10 DIAGNOSIS — I953 Hypotension of hemodialysis: Secondary | ICD-10-CM | POA: Diagnosis present

## 2019-04-10 DIAGNOSIS — Z6841 Body Mass Index (BMI) 40.0 and over, adult: Secondary | ICD-10-CM | POA: Diagnosis not present

## 2019-04-10 DIAGNOSIS — R0602 Shortness of breath: Secondary | ICD-10-CM | POA: Diagnosis not present

## 2019-04-10 DIAGNOSIS — Z7189 Other specified counseling: Secondary | ICD-10-CM | POA: Diagnosis not present

## 2019-04-10 DIAGNOSIS — K439 Ventral hernia without obstruction or gangrene: Secondary | ICD-10-CM | POA: Diagnosis not present

## 2019-04-10 DIAGNOSIS — I4891 Unspecified atrial fibrillation: Secondary | ICD-10-CM | POA: Diagnosis not present

## 2019-04-10 DIAGNOSIS — I132 Hypertensive heart and chronic kidney disease with heart failure and with stage 5 chronic kidney disease, or end stage renal disease: Secondary | ICD-10-CM | POA: Diagnosis not present

## 2019-04-10 LAB — RENAL FUNCTION PANEL
Albumin: 3.6 g/dL (ref 3.5–5.0)
Anion gap: 10 (ref 5–15)
BUN: 24 mg/dL — ABNORMAL HIGH (ref 8–23)
CO2: 30 mmol/L (ref 22–32)
Calcium: 9.5 mg/dL (ref 8.9–10.3)
Chloride: 97 mmol/L — ABNORMAL LOW (ref 98–111)
Creatinine, Ser: 6.04 mg/dL — ABNORMAL HIGH (ref 0.44–1.00)
GFR calc Af Amer: 7 mL/min — ABNORMAL LOW (ref >60)
GFR calc non Af Amer: 6 mL/min — ABNORMAL LOW (ref >60)
Glucose, Bld: 117 mg/dL — ABNORMAL HIGH (ref 70–99)
Phosphorus: 4.3 mg/dL (ref 2.5–4.6)
Potassium: 3.7 mmol/L (ref 3.5–5.1)
Sodium: 137 mmol/L (ref 135–145)

## 2019-04-10 LAB — CBC
HCT: 27.9 % — ABNORMAL LOW (ref 36.0–46.0)
Hemoglobin: 8.9 g/dL — ABNORMAL LOW (ref 12.0–15.0)
MCH: 35.5 pg — ABNORMAL HIGH (ref 26.0–34.0)
MCHC: 31.9 g/dL (ref 30.0–36.0)
MCV: 111.2 fL — ABNORMAL HIGH (ref 80.0–100.0)
Platelets: 130 10*3/uL — ABNORMAL LOW (ref 150–400)
RBC: 2.51 MIL/uL — ABNORMAL LOW (ref 3.87–5.11)
RDW: 13.5 % (ref 11.5–15.5)
WBC: 6.5 10*3/uL (ref 4.0–10.5)
nRBC: 0 % (ref 0.0–0.2)

## 2019-04-10 LAB — FOLATE RBC
Folate, Hemolysate: >620 ng/mL
Folate, RBC: >2153 ng/mL (ref >498)
Hematocrit: 28.8 % — ABNORMAL LOW (ref 34.0–46.6)

## 2019-04-10 LAB — PROTIME-INR
INR: 1.7 — ABNORMAL HIGH (ref 0.8–1.2)
Prothrombin Time: 19.5 seconds — ABNORMAL HIGH (ref 11.4–15.2)

## 2019-04-10 LAB — HIV ANTIBODY (ROUTINE TESTING W REFLEX): HIV Screen 4th Generation wRfx: NONREACTIVE

## 2019-04-10 LAB — HEPATITIS B SURFACE ANTIGEN: Hepatitis B Surface Ag: NEGATIVE

## 2019-04-10 LAB — MRSA PCR SCREENING: MRSA by PCR: NEGATIVE

## 2019-04-10 MED ORDER — WARFARIN SODIUM 5 MG PO TABS: 10.0000 mg | ORAL_TABLET | Freq: Once | ORAL | Status: DC

## 2019-04-10 MED ORDER — WARFARIN SODIUM 7.5 MG PO TABS: 7.5000 mg | ORAL_TABLET | ORAL | Status: DC

## 2019-04-10 MED ORDER — WARFARIN SODIUM 5 MG PO TABS
10.0000 mg | ORAL_TABLET | Freq: Once | ORAL | Status: AC
  Administered 2019-04-10: 10 mg via ORAL
  Filled 2019-04-10: qty 2

## 2019-04-10 MED ORDER — WARFARIN - PHARMACIST DOSING INPATIENT
Freq: Every day | Status: DC
  Administered 2019-04-11 – 2019-04-24 (×11)

## 2019-04-10 MED ORDER — BUDESONIDE 0.5 MG/2ML IN SUSP
0.5000 mg | Freq: Two times a day (BID) | RESPIRATORY_TRACT | Status: DC
  Administered 2019-04-10 – 2019-04-25 (×30): 0.5 mg via RESPIRATORY_TRACT
  Filled 2019-04-10 (×30): qty 2

## 2019-04-10 NOTE — Progress Notes (Signed)
ANTICOAGULATION CONSULT NOTE - Initial Consult  Pharmacy Consult for Coumadin Indication: atrial fibrillation  No Known Allergies  Patient Measurements: Height: 5\' 4"  (162.6 cm) Weight: 243 lb 2.7 oz (110.3 kg) IBW/kg (Calculated) : 54.7  Vital Signs: Temp: 99.6 F (37.6 C) (09/03 0519) Temp Source: Oral (09/03 0519) BP: 117/49 (09/03 0519) Pulse Rate: 83 (09/03 0519)  Labs: Recent Labs    04/08/19 1600 04/08/19 1601 04/08/19 1822 04/09/19 0558 04/10/19 0437  HGB 11.0*  --   --  9.8* 8.9*  HCT 33.9*  --   --  30.4* 27.9*  PLT 146*  --   --  141* 130*  LABPROT  --  20.8*  --   --  19.5*  INR  --  1.8*  --   --  1.7*  CREATININE 9.98*  --   --  11.14* 6.04*  TROPONINIHS 20*  --  22*  --   --     Estimated Creatinine Clearance: 9.2 mL/min (A) (by C-G formula based on SCr of 6.04 mg/dL (H)).   Medical History: Past Medical History:  Diagnosis Date  . Anemia   . Atrial fibrillation ([place])   . Chest pain 05/15/2006   Stress test negative for ischemia  . Diastolic heart failure ([place])   . Diverticulosis   . ESRD on hemodialysis ([place])   . GERD (gastroesophageal reflux disease)   . Headache   . Mixed hyperlipidemia   . Peripheral neuropathy   . PONV (postoperative nausea and vomiting)   . Systemic hypertension   . Uterine cancer (HCC)     Medications:  Medications Prior to Admission  Medication Sig Dispense Refill Last Dose  . acetaminophen (TYLENOL) 500 MG tablet Take 1,000 mg by mouth every 6 (six) hours as needed for moderate pain.    Past Week at Unknown time  . Cholecalciferol (VITAMIN D) 2000 UNITS CAPS Take 2,000 Units by mouth every evening.    04/07/2019 at Unknown time  . gabapentin (NEURONTIN) 100 MG capsule Take 100 mg by mouth 2 (two) times daily.    04/08/2019 at Unknown time  . metoprolol tartrate (LOPRESSOR) 50 MG tablet Take 1 tablet (50 mg total) by mouth 2 (two) times daily. (Patient taking differently: Take 50 mg by mouth See admin instructions.  Take one tablet every day at bedtime. Take one tablet every day in the morning on days that are non-dialysis days (T-TH-S-S)) 60 tablet 6 04/08/2019 at [place]  . Multiple Vitamins-Minerals (PRESERVISION AREDS 2 PO) Take 1 tablet by mouth 2 (two) times daily.   04/08/2019 at Unknown time  . multivitamin (RENA-VIT) TABS tablet Take 1 tablet by mouth every evening.    04/07/2019 at Unknown time  . Omega-3 Fatty Acids (OMEGA-3 FISH OIL) 300 MG CAPS Take 300 mg by mouth 2 (two) times daily.   04/08/2019 at Unknown time  . omeprazole (PRILOSEC) 20 MG capsule Take 20 mg by mouth every morning.    04/08/2019 at Unknown time  . Propylene Glycol (SYSTANE BALANCE) 0.6 % SOLN Place 1 drop into both eyes 3 (three) times daily as needed (for burning or dry eyes).    Past Month at Unknown time  . RENVELA 800 MG tablet Take 2,400 mg by mouth 3 (three) times daily with meals. Takes 1 tablet with snacks   04/08/2019 at Unknown time  . warfarin (COUMADIN) 5 MG tablet Take 1.5 to 2 tablets by mouth daily as directed by coumadin clinic (Patient taking differently: Take 7.5-10 mg by mouth  See admin instructions. 10mg  (10mg  total) on Mondays, then take 7.5mg  (1.5 tablets) on all other days in the evening.) 150 tablet 1 04/07/2019 at 2000    Assessment: 78 y.o.female,with history of uterine cancer, hypertension, peripheral neuropathy, hyperlipidemia, GERD, ESRD on dialysis M-W-F, diastolic heart failure, atrial fibrillation, and anemia presents to the ED with shortness of breath. Patient had been getting progressively short of breath over 5 days. Patient chronically anticoagulated with coumadin. Pharmacy asked to monitor and dose coumadin. INR has been hovering around 1.8 to 1.7, will give slight booster dose today. Home dose is 10mg  on Monday and 7.5mg  all other days.  Goal of Therapy:  INR 2-3 Monitor platelets by anticoagulation protocol: Yes   Plan:  Coumadin 10mg  po x 1 today Daily PT-INR Monitor for s/s of bleeding  Isac Sarna, BS [place], BCPS Clinical Pharmacist Pager [phone] 04/10/2019,9:44 AM

## 2019-04-10 NOTE — Progress Notes (Signed)
Patient ID: Sabrina Beck, female   DOB: 1940/09/24, 78 y.o.   MRN: [id number] S: Feels a little better but still very dyspneic with minimal exertion.  Only able to UF 2 liters with HD due to hypotension. O:BP (!) 117/49 (BP Location: Right Arm)   Pulse 83   Temp 99.6 F (37.6 C) (Oral)   Resp (!) 24   Ht 5\' 4"  (1.626 m)   Wt 110.3 kg   SpO2 98%   BMI 41.74 kg/m   Intake/Output Summary (Last 24 hours) at 04/10/2019 0857 Last data filed at 04/09/2019 1950 Gross per 24 hour  Intake -  Output 2058 ml  Net -2058 ml   Intake/Output: I/O last 3 completed shifts: In: 545.6 [I.V.:195.6; IV Piggyback:350] Out: 2058 [Other:2058]  Intake/Output this shift:  No intake/output data recorded. Weight change: 0.53 kg Gen: obese WF in NAD CVS: no rub Resp: crackles at right base Abd: obese, +BS, soft, NT Ext: no edema, LAVF +T/B  Recent Labs  Lab 04/08/19 1600 04/09/19 0558 04/10/19 0437  NA 138 139 137  K 4.1 4.9 3.7  CL 99 104 97*  CO2 25 24 30   GLUCOSE 140* 110* 117*  BUN 45* 51* 24*  CREATININE 9.98* 11.14* 6.04*  ALBUMIN  --  3.2* 3.6  CALCIUM 9.8 9.2 9.5  PHOS  --   --  4.3  AST  --  13*  --   ALT  --  22  --    Liver Function Tests: Recent Labs  Lab 04/09/19 0558 04/10/19 0437  AST 13*  --   ALT 22  --   ALKPHOS 60  --   BILITOT 0.7  --   PROT 6.1*  --   ALBUMIN 3.2* 3.6   No results for input(s): LIPASE, AMYLASE in the last 168 hours. No results for input(s): AMMONIA in the last 168 hours. CBC: Recent Labs  Lab 04/08/19 1600 04/09/19 0558 04/10/19 0437  WBC 8.0 7.1 6.5  NEUTROABS  --  4.8  --   HGB 11.0* 9.8* 8.9*  HCT 33.9* 30.4* 27.9*  MCV 110.1* 110.9* 111.2*  PLT 146* 141* 130*   Cardiac Enzymes: No results for input(s): CKTOTAL, CKMB, CKMBINDEX, TROPONINI in the last 168 hours. CBG: No results for input(s): GLUCAP in the last 168 hours.  Iron Studies: No results for input(s): IRON, TIBC, TRANSFERRIN, FERRITIN in the last 72  hours. Studies/Results: Dg Chest Port 1 View  Result Date: 04/08/2019 CLINICAL DATA:  Difficulty breathing for past few days, dyspnea on exertion, denies fever, history atrial fibrillation, heart failure, end-stage renal disease on dialysis, systemic hypertension, uterine cancer EXAM: PORTABLE CHEST 1 VIEW COMPARISON:  Portable exam 1645 hours compared to 08/18/2018 FINDINGS: Enlargement of cardiac silhouette. Mediastinal contours and pulmonary vascularity normal. Atherosclerotic calcification aorta. RIGHT lower lobe consolidation consistent with pneumonia. Remaining lungs clear. No pleural effusion or pneumothorax. Bones demineralized. IMPRESSION: Enlargement of cardiac silhouette. RIGHT lower lobe consolidation consistent with pneumonia. Electronically Signed   By: Lavonia Dana M.D.   On: 04/08/2019 17:18   . cholecalciferol  2,000 Units Oral QPM  . doxercalciferol  8 mcg Intravenous Q M,W,F-HD  . epoetin (EPOGEN/PROCRIT) injection  2,800 Units Intravenous Q M,W,F-HD  . gabapentin  100 mg Oral BID  . metoprolol tartrate  50 mg Oral QHS  . metoprolol tartrate  50 mg Oral Once per day on Sun Tue Thu Sat  . pantoprazole  40 mg Oral Daily  . sevelamer carbonate  2,400 mg Oral  TID WC  . [START ON 04/14/2019] warfarin  10 mg Oral Q Mon-1800  . warfarin  7.5 mg Oral Once per day on Sun Tue Wed Thu Fri Sat  . Warfarin - Physician Dosing Inpatient   Does not apply q1800    BMET    Component Value Date/Time   NA 137 04/10/2019 0437   NA 142 06/08/2015 0735   K 3.7 04/10/2019 0437   CL 97 (L) 04/10/2019 0437   CO2 30 04/10/2019 0437   GLUCOSE 117 (H) 04/10/2019 0437   BUN 24 (H) 04/10/2019 0437   BUN 37 (H) 06/08/2015 0735   CREATININE 6.04 (H) 04/10/2019 0437   CALCIUM 9.5 04/10/2019 0437   CALCIUM 8.1 (L) 01/11/2012 1039   GFRNONAA 6 (L) 04/10/2019 0437   GFRAA 7 (L) 04/10/2019 0437   CBC    Component Value Date/Time   WBC 6.5 04/10/2019 0437   RBC 2.51 (L) 04/10/2019 0437   HGB 8.9 (L)  04/10/2019 0437   HCT 27.9 (L) 04/10/2019 0437   PLT 130 (L) 04/10/2019 0437   MCV 111.2 (H) 04/10/2019 0437   MCH 35.5 (H) 04/10/2019 0437   MCHC 31.9 04/10/2019 0437   RDW 13.5 04/10/2019 0437   LYMPHSABS 1.4 04/09/2019 0558   MONOABS 0.7 04/09/2019 0558   EOSABS 0.1 04/09/2019 0558   BASOSABS 0.1 04/09/2019 0558    Dialysis Orders: Center: [facility]  on MWF . EDW 103 HD Bath 2K/2.5Ca  Time 3.5 hrs Heparin none. Access LAVF BFR 400 DFR 600    Hectoral 9.5 mcg IV/HD Epogen 2,800   Units IV/HD   Assessment/Plan: 1. HCAP- crackles in RLL persist despite HD with UF yesterday consistent with pneumonia.  Continue with abx per primary svc  2. DOE- persists despite UF of 2 liters yesterday.  Likely related to HCAP- will plan for HD with UF again tomorrow and follow respiratory response.  Agree with treating for HCAP for now and follow.  Will need PT/OT as well as home O2 eval if persists. 3.  ESRD -   Plan for HD tomorrow to keep on schedule and weight standing up on a scale as bed scale is likely off. 4.  Hypertension/volume  - has crackles at bases and trace pretibial edema.  Weight is up 7 kg but doubt she is that overloaded as she does not have a history of large idwg per her primary Nephrology PA.  Plan for uf as bp tolerates today and weigh standing up if able 5.  Anemia  -  Cont with ESA and follow.  Hgb continues to drop.  Guaiac stools and follow h/h. 6.  Metabolic bone disease -  Cont with home meds 7.  Nutrition - renal diet  Donetta Potts, MD [facility] [phone]

## 2019-04-10 NOTE — TOC Initial Note (Signed)
Transition of Care [facility]) - Initial/Assessment Note    Patient Details  Name: Sabrina Beck MRN: [id number] Date of Birth: 1940-09-19  [facility]) CM/SW Contact:    Ihor Gully, LCSW Phone Number: 04/10/2019, 2:52 PM  Clinical Narrative:                 Patient admitted for CAP. From home where adult son lives with her. Reports being independent at baseline and uses a cane. Drives. Not currently active with HH and no home oxygen. Goes to [facility] in [place] on MWF schedule.  TOC to follow through discharge and address needs as they arise.   Expected Discharge Plan: [place] Barriers to Discharge: No Barriers Identified   Patient Goals and CMS Choice Patient states their goals for this hospitalization and ongoing recovery are:: return home and get back to baseline.      Expected Discharge Plan and Services Expected Discharge Plan: [place]                                              Prior Living Arrangements/Services   Lives with:: Adult Children Patient language and need for interpreter reviewed:: Yes Do you feel safe going back to the place where you live?: Yes      Need for Family Participation in Patient Care: Yes (Comment) Care giver support system in place?: Yes (comment) Current home services: ([facility]) Criminal Activity/Legal Involvement Pertinent to Current Situation/Hospitalization: No - Comment as needed  Activities of Daily Living Home Assistive Devices/Equipment: Cane (specify quad or straight)(quad) ADL Screening (condition at time of admission) Patient's cognitive ability adequate to safely complete daily activities?: Yes Is the patient deaf or have difficulty hearing?: No Does the patient have difficulty seeing, even when wearing glasses/contacts?: No Does the patient have difficulty concentrating, remembering, or making decisions?: No Patient able to express need for assistance with ADLs?:  Yes Does the patient have difficulty dressing or bathing?: No Independently performs ADLs?: Yes (appropriate for developmental age) Does the patient have difficulty walking or climbing stairs?: Yes Weakness of Legs: None Weakness of Arms/Hands: None  Permission Sought/Granted Permission sought to share information with : PCP                Emotional Assessment Appearance:: Appears older than stated age   Affect (typically observed): Accepting, Calm Orientation: : Oriented to Self, Oriented to Place, Oriented to  Time, Oriented to Situation Alcohol / Substance Use: Not Applicable Psych Involvement: No (comment)  Admission diagnosis:  Shortness of breath [R06.02] ESRD (end stage renal disease) ([place]) [N18.6] Pneumonia of right lower lobe due to infectious organism [facility]) [J18.1] Patient Active Problem List   Diagnosis Date Noted  . CAP (community acquired pneumonia) 04/08/2019  . Permanent atrial fibrillation 08/22/2018  . Acute systolic CHF (congestive heart failure) ([place]) 08/22/2018  . Community acquired pneumonia   . HCAP (healthcare-associated pneumonia) 08/18/2018  . Acute respiratory failure with hypoxia ([place]) 08/18/2018  . Pulmonary edema 08/18/2018  . Lobar pneumonia ([place]) 08/18/2018  . Dependence on renal dialysis ([place]) 10/19/2015  . Pure hypercholesterolemia 10/19/2015  . ESRD (end stage renal disease) ([place]) 05/19/2014  . 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 Abscess re-check 01/19/2014  . Breast abscess of female 01/19/2014  . Chronic diastolic CHF (congestive heart  failure) ([place]) 12/25/2013  . Dyspnea 12/21/2013  . CHF (congestive heart failure) ([place]) 12/21/2013  . End stage renal disease ([place]) 11/06/2013  . CKD (chronic kidney disease) stage 5, GFR less than 15 ml/min (HCC) 10/21/2013  . HTN (hypertension) 10/21/2013  . Mixed hyperlipidemia 10/21/2013  . Encounter for therapeutic drug monitoring 09/03/2013  . Atrial  fibrillation ([place]) 10/11/2012  . Long term current use of anticoagulant therapy 10/11/2012  . Anemia 03/27/2011  . Gastro-esophageal reflux disease without esophagitis 05/27/2010  . Malignant neoplasm of uterus ([place]) 05/27/2010   PCP:  Redmond School, MD Pharmacy:   [place], [place] - [place] [id number] [place] [address] Phone: [phone] Fax: [phone]     Social Determinants of Health (SDOH) Interventions    Readmission Risk Interventions No flowsheet data found.

## 2019-04-10 NOTE — Progress Notes (Signed)
PROGRESS NOTE    SWAN FAIRFAX  FXT:[id number] DOB: 1940-08-20 DOA: 04/08/2019 PCP: Redmond School, MD     Brief Narrative:  78 y.o. 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  Patient had been getting progressively short of breath over 5 days.  She reports that it felt like she just could not take a deep breath in.  Patient denies any chest pain, coughing, fever, dizziness.  Patient has been admitted for pneumonia before in January 2020.  This episode feels like that episode only not as bad.  Patient reports no sick contacts.  Covid test negative.  ED course Chemistry shows a glucose of 140, BUN of 45, creatinine of 9.98, and GFR of 3.  Patient has a BNP of 344.  Initial troponin is 20.  Hematology reveals no leukocytosis, but a macrocytic anemia with a hemoglobin of 11.  Chest x-ray was done that shows enlargement of cardiac silhouette.  Right lower lobe consolidation consistent with pneumonia.  Blood cultures drawn.  Lactic acid 1.6.  Troponin stable at 22.  Second lactic acid 1.3.   Assessment & Plan: 1-shortness of breath and hypoxia: In the setting of healthcare associated pneumonia and presumed acute on chronic diastolic heart failure. -Continue IV antibiotics, using only cefepime at this time; vancomycin discontinued in the setting of MRSA negative by PCR. -Nephrology service contacted to assist with decreasing her dry weight and attempting to improve interstitial edema and vascular congestion. -If needed might benefit of the use of midodrine prior to dialysis to have better blood pressure control and obtain lower dry weight. -Repeat chest x-ray at with positive interstitial edema and vascular congestion. -Continue DuoNeb and supportive care. -Follow culture results -Continue Mucinex,  flutter valve and start the use of Pulmicort. -Continue weaning oxygen supplementation as tolerated  2-end-stage renal disease -Continue hemodialysis -Follow nephrology recommendations.  3-chronic atrial fibrillation -Rate controlled and stable -Continue metoprolol for rate management and continue warfarin for secondary prevention (pharmacy to dose).   -Monitoring on telemetry. -Patient reports no palpitations.  4-secondary hyperparathyroidism -Continue Renvela  5-anemia of chronic disease -Associated with chronic renal failure -Follow recommendations by nephrology for IV iron and Epogen therapy. -Normal B12 level.  6-morbid obesity -Low calorie diet, portion control and increase physical activity discussed with patient -Body mass index is 41.74 kg/m.  7-gastroesophageal reflux disease -Continue PPI.  8-hypertension -Stable and well-controlled -Continue metoprolol   DVT prophylaxis: Chronically on warfarin Code Status: Full code Family Communication: Son at bedside Disposition Plan: Remains inpatient, discontinue vancomycin in the setting of negative for MRSA by PCR.  Continue IV cefepime.  Follow further fluid elimination with hemodialysis as per nephrology recommendation.  Repeat chest x-ray suggesting interstitial edema, vascular congestion and right lower lobe infiltrate.  Continue PRN duo nebs, flutter valve and start Pulmicort.  Consultants:   Nephrology service  Procedures:   See below for x-ray reports  Antimicrobials:  Anti-infectives (From admission, onward)   Start     Dose/Rate Route Frequency Ordered Stop   04/11/19 1600  vancomycin (VANCOCIN) IVPB 1000 mg/200 mL premix  Status:  Discontinued     1,000 mg 200 mL/hr over 60 Minutes Intravenous Once per day on Mon Wed Fri 04/09/19 1755 04/10/19 1003   04/10/19 1800  ceFEPIme (MAXIPIME) 500 mg in dextrose 5 % 50 mL IVPB  Status:  Discontinued  500 mg 100 mL/hr over 30 Minutes Intravenous Every 24  hours 04/09/19 1749 04/09/19 1750   04/09/19 1900  azithromycin (ZITHROMAX) 500 mg in sodium chloride 0.9 % 250 mL IVPB     500 mg 250 mL/hr over 60 Minutes Intravenous Every 24 hours 04/08/19 2300 04/14/19 1859   04/09/19 1800  cefTRIAXone (ROCEPHIN) 2 g in sodium chloride 0.9 % 100 mL IVPB  Status:  Discontinued     2 g 200 mL/hr over 30 Minutes Intravenous Every 24 hours 04/08/19 2300 04/09/19 1725   04/09/19 1800  ceFEPIme (MAXIPIME) 1 g in sodium chloride 0.9 % 100 mL IVPB  Status:  Discontinued     1 g 200 mL/hr over 30 Minutes Intravenous  Once 04/09/19 1725 04/09/19 1750   04/09/19 1800  vancomycin (VANCOCIN) IVPB 1000 mg/200 mL premix     1,000 mg 200 mL/hr over 60 Minutes Intravenous Every 1 hr x 2 04/09/19 1745 04/09/19 2000   04/09/19 1800  ceFEPIme (MAXIPIME) 1 g in sodium chloride 0.9 % 100 mL IVPB     1 g 200 mL/hr over 30 Minutes Intravenous Every 24 hours 04/09/19 1751     04/08/19 1800  cefTRIAXone (ROCEPHIN) 1 g in sodium chloride 0.9 % 100 mL IVPB     1 g 200 mL/hr over 30 Minutes Intravenous  Once 04/08/19 1750 04/08/19 2035   04/08/19 1800  azithromycin (ZITHROMAX) 500 mg in sodium chloride 0.9 % 250 mL IVPB     500 mg 250 mL/hr over 60 Minutes Intravenous  Once 04/08/19 1750 04/08/19 2035       Subjective: No fever, no chest pain, no nausea, no vomiting.  Continues to require oxygen supplementation, short of breath and very dyspneic with minimal exertion.  Objective: Vitals:   04/09/19 1958 04/09/19 2005 04/09/19 2124 04/10/19 0519  BP:  (!) 103/54 (!) 104/51 (!) 117/49  Pulse:  84 86 83  Resp:  18 20 (!) 24  Temp:  98.3 F (36.8 C) 98.8 F (37.1 C) 99.6 F (37.6 C)  TempSrc:  Oral Oral Oral  SpO2: 93% 94% 92% 98%  Weight:      Height:        Intake/Output Summary (Last 24 hours) at 04/10/2019 1726 Last data filed at 04/10/2019 1600 Gross per 24 hour  Intake 765 ml  Output 2058 ml  Net -1293 ml   Filed Weights   04/08/19 1501 04/09/19 1545   Weight: 109.8 kg 110.3 kg    Examination: General exam: Alert, awake, oriented x 3, no chest pain, no nausea, no vomiting.  Still requiring oxygen supplementation, very dyspneic with minimal exertion and demonstrating mild difficulty to speak in full sentences. Respiratory system: Positive rhonchi right, fine crackles at the bases, positive tachypnea.  No using accessory muscles. Cardiovascular system:Rate controlled, no no rubs, no gallops, unable to properly assess JVD due to body habitus.   Gastrointestinal system: Abdomen is obese, nondistended, soft and nontender. No organomegaly or masses felt. Normal bowel sounds heard. Central nervous system: Alert and oriented. No focal neurological deficits. Extremities: No cyanosis or clubbing; left forearm AV fistula with good bruit.  Trace edema bilaterally. Skin: No rashes, lesions or ulcers Psychiatry: Judgement and insight appear normal. Mood & affect appropriate.    Data Reviewed: I have personally reviewed following labs and imaging studies  CBC: Recent Labs  Lab 04/08/19 1600 04/09/19 0558 04/10/19 0437  WBC 8.0 7.1 6.5  NEUTROABS  --  4.8  --   HGB  11.0* 9.8* 8.9*  HCT 33.9* 30.4* 27.9*  MCV 110.1* 110.9* 111.2*  PLT 146* 141* 528*   Basic Metabolic Panel: Recent Labs  Lab 04/08/19 1600 04/09/19 0558 04/10/19 0437  NA 138 139 137  K 4.1 4.9 3.7  CL 99 104 97*  CO2 25 24 30   GLUCOSE 140* 110* 117*  BUN 45* 51* 24*  CREATININE 9.98* 11.14* 6.04*  CALCIUM 9.8 9.2 9.5  PHOS  --   --  4.3   GFR: Estimated Creatinine Clearance: 9.2 mL/min (A) (by C-G formula based on SCr of 6.04 mg/dL (H)).   Liver Function Tests: Recent Labs  Lab 04/09/19 0558 04/10/19 0437  AST 13*  --   ALT 22  --   ALKPHOS 60  --   BILITOT 0.7  --   PROT 6.1*  --   ALBUMIN 3.2* 3.6   Coagulation Profile: Recent Labs  Lab 04/08/19 1601 04/10/19 0437  INR 1.8* 1.7*   Anemia Panel: Recent Labs    04/09/19 0558  VITAMINB12 467    Urine analysis:    Component Value Date/Time   COLORURINE YELLOW 03/03/2012 1500   APPEARANCEUR CLEAR 03/03/2012 1500   LABSPEC 1.020 03/03/2012 1500   PHURINE 6.0 03/03/2012 1500   GLUCOSEU 100 (A) 03/03/2012 1500   HGBUR TRACE (A) 03/03/2012 1500   BILIRUBINUR NEGATIVE 03/03/2012 1500   KETONESUR NEGATIVE 03/03/2012 1500   PROTEINUR 100 (A) 03/03/2012 1500   UROBILINOGEN 0.2 03/03/2012 1500   NITRITE NEGATIVE 03/03/2012 1500   LEUKOCYTESUR NEGATIVE 03/03/2012 1500    Recent Results (from the past 240 hour(s))  SARS Coronavirus 2 [facility] order, Performed in [facility] hospital lab) Nasopharyngeal Nasopharyngeal Swab     Status: None   Collection Time: 04/08/19  5:50 PM   Specimen: Nasopharyngeal Swab  Result Value Ref Range Status   SARS Coronavirus 2 NEGATIVE NEGATIVE Final    Comment: (NOTE) If result is NEGATIVE SARS-CoV-2 target nucleic acids are NOT DETECTED. The SARS-CoV-2 RNA is generally detectable in upper and lower  respiratory specimens during the acute phase of infection. The lowest  concentration of SARS-CoV-2 viral copies this assay can detect is 250  copies / mL. A negative result does not preclude SARS-CoV-2 infection  and should not be used as the sole basis for treatment or other  patient management decisions.  A negative result may occur with  improper specimen collection / handling, submission of specimen other  than nasopharyngeal swab, presence of viral mutation(s) within the  areas targeted by this assay, and inadequate number of viral copies  (<250 copies / mL). A negative result must be combined with clinical  observations, patient history, and epidemiological information. If result is POSITIVE SARS-CoV-2 target nucleic acids are DETECTED. The SARS-CoV-2 RNA is generally detectable in upper and lower  respiratory specimens dur ing the acute phase of infection.  Positive  results are indicative of active infection with SARS-CoV-2.  Clinical   correlation with patient history and other diagnostic information is  necessary to determine patient infection status.  Positive results do  not rule out bacterial infection or co-infection with other viruses. If result is PRESUMPTIVE POSTIVE SARS-CoV-2 nucleic acids MAY BE PRESENT.   A presumptive positive result was obtained on the submitted specimen  and confirmed on repeat testing.  While 2019 novel coronavirus  (SARS-CoV-2) nucleic acids may be present in the submitted sample  additional confirmatory testing may be necessary for epidemiological  and / or clinical management purposes  to differentiate  between  SARS-CoV-2 and other Sarbecovirus currently known to infect humans.  If clinically indicated additional testing with an alternate test  methodology [phone]) is advised. The SARS-CoV-2 RNA is generally  detectable in upper and lower respiratory sp ecimens during the acute  phase of infection. The expected result is Negative. Fact Sheet for Patients:  StrictlyIdeas.no Fact Sheet for Healthcare Providers: BankingDealers.co.za This test is not yet approved or cleared by the [place] FDA and has been authorized for detection and/or diagnosis of SARS-CoV-2 by FDA under an Emergency Use Authorization (EUA).  This EUA will remain in effect (meaning this test can be used) for the duration of the COVID-19 declaration under Section 564(b)(1) of the Act, 21 U.S.C. section 360bbb-3(b)(1), unless the authorization is terminated or revoked sooner. Performed at [facility], [address], [place], [place] [address]   Culture, blood (routine x 2)     Status: None (Preliminary result)   Collection Time: 04/08/19  6:21 PM   Specimen: BLOOD  Result Value Ref Range Status   Specimen Description BLOOD RIGHT ANTECUBITAL  Final   Special Requests   Final    BOTTLES DRAWN AEROBIC AND ANAEROBIC Blood Culture adequate volume   Culture   Final     NO GROWTH 2 DAYS Performed at [facility], [address] [place], [place] [address]    Report Status PENDING  Incomplete  Culture, blood (routine x 2)     Status: None (Preliminary result)   Collection Time: 04/08/19  6:22 PM   Specimen: BLOOD  Result Value Ref Range Status   Specimen Description BLOOD RIGHT ANTECUBITAL  Final   Special Requests   Final    BOTTLES DRAWN AEROBIC AND ANAEROBIC Blood Culture adequate volume   Culture   Final    NO GROWTH 2 DAYS Performed at [facility], [address], [place], [place] [address]    Report Status PENDING  Incomplete  MRSA PCR Screening     Status: None   Collection Time: 04/09/19  9:45 PM   Specimen: Nasopharyngeal  Result Value Ref Range Status   MRSA by PCR NEGATIVE NEGATIVE Final    Comment:        The GeneXpert MRSA Assay (FDA approved for NASAL specimens only), is one component of a comprehensive MRSA colonization surveillance program. It is not intended to diagnose MRSA infection nor to guide or monitor treatment for MRSA infections. Performed at [facility], [address] [place], [place] [address]     Radiology Studies: Dg Chest 2 View  Result Date: 04/10/2019 CLINICAL DATA:  Shortness of breath. EXAM: CHEST - 2 VIEW COMPARISON:  04/08/2019 and 08/18/2018 FINDINGS: Cardiac size is within normal limits. Aortic atherosclerosis. New pulmonary vascular congestion. New bilateral perihilar infiltrates. Persistent hazy infiltrate at the right lung base posteriorly. Minimal blunting of the posterior costophrenic angles. No acute bone abnormality. IMPRESSION: New bilateral perihilar infiltrates. Persistent infiltrate at the right lung base. This could represent pulmonary edema or multifocal progressive pneumonia. Aortic Atherosclerosis (ICD10-I70.0). Electronically Signed   By: Lorriane Shire M.D.   On: 04/10/2019 11:04    Scheduled Meds: . budesonide (PULMICORT) nebulizer solution  0.5 mg Nebulization BID  .  cholecalciferol  2,000 Units Oral QPM  . doxercalciferol  8 mcg Intravenous Q M,W,F-HD  . epoetin (EPOGEN/PROCRIT) injection  2,800 Units Intravenous Q M,W,F-HD  . gabapentin  100 mg Oral BID  . metoprolol tartrate  50 mg Oral QHS  . metoprolol tartrate  50 mg Oral Once  per day on Sun Tue Thu Sat  . pantoprazole  40 mg Oral Daily  . sevelamer carbonate  2,400 mg Oral TID WC  . warfarin  10 mg Oral Once  . Warfarin - Pharmacist Dosing Inpatient   Does not apply q1800   Continuous Infusions: . sodium chloride 50 mL/hr at 04/10/19 1600  . sodium chloride    . sodium chloride    . azithromycin 500 mg (04/09/19 1815)  . ceFEPime (MAXIPIME) IV 1 g (04/09/19 2000)     LOS: 0 days    Time spent: 35 minutes    Barton Dubois, MD [facility] Pager [phone]   04/10/2019, 5:26 PM

## 2019-04-11 ENCOUNTER — Inpatient Hospital Stay (HOSPITAL_COMMUNITY): Payer: Medicare HMO

## 2019-04-11 LAB — RENAL FUNCTION PANEL
Albumin: 3.7 g/dL (ref 3.5–5.0)
Anion gap: 13 (ref 5–15)
BUN: 46 mg/dL — ABNORMAL HIGH (ref 8–23)
CO2: 26 mmol/L (ref 22–32)
Calcium: 9.6 mg/dL (ref 8.9–10.3)
Chloride: 96 mmol/L — ABNORMAL LOW (ref 98–111)
Creatinine, Ser: 9.25 mg/dL — ABNORMAL HIGH (ref 0.44–1.00)
GFR calc Af Amer: 4 mL/min — ABNORMAL LOW (ref >60)
GFR calc non Af Amer: 4 mL/min — ABNORMAL LOW (ref >60)
Glucose, Bld: 123 mg/dL — ABNORMAL HIGH (ref 70–99)
Phosphorus: 7.5 mg/dL — ABNORMAL HIGH (ref 2.5–4.6)
Potassium: 5 mmol/L (ref 3.5–5.1)
Sodium: 135 mmol/L (ref 135–145)

## 2019-04-11 LAB — BLOOD GAS, ARTERIAL
Acid-Base Excess: 6.3 mmol/L — ABNORMAL HIGH (ref 0.0–2.0)
Bicarbonate: 29.4 mmol/L — ABNORMAL HIGH (ref 20.0–28.0)
FIO2: 28
O2 Saturation: 88.4 %
Patient temperature: 37
pCO2 arterial: 47.1 mmHg (ref 32.0–48.0)
pH, Arterial: 7.429 (ref 7.350–7.450)
pO2, Arterial: 56 mmHg — ABNORMAL LOW (ref 83.0–108.0)

## 2019-04-11 LAB — PROTIME-INR
INR: 1.7 — ABNORMAL HIGH (ref 0.8–1.2)
Prothrombin Time: 19.6 seconds — ABNORMAL HIGH (ref 11.4–15.2)

## 2019-04-11 LAB — CBC
HCT: 28.8 % — ABNORMAL LOW (ref 36.0–46.0)
Hemoglobin: 9.3 g/dL — ABNORMAL LOW (ref 12.0–15.0)
MCH: 35.5 pg — ABNORMAL HIGH (ref 26.0–34.0)
MCHC: 32.3 g/dL (ref 30.0–36.0)
MCV: 109.9 fL — ABNORMAL HIGH (ref 80.0–100.0)
Platelets: 154 10*3/uL (ref 150–400)
RBC: 2.62 MIL/uL — ABNORMAL LOW (ref 3.87–5.11)
RDW: 13.3 % (ref 11.5–15.5)
WBC: 6.8 10*3/uL (ref 4.0–10.5)
nRBC: 0 % (ref 0.0–0.2)

## 2019-04-11 LAB — GLUCOSE, CAPILLARY: Glucose-Capillary: 101 mg/dL — ABNORMAL HIGH (ref 70–99)

## 2019-04-11 MED ORDER — CHLORHEXIDINE GLUCONATE CLOTH 2 % EX PADS
6.0000 | MEDICATED_PAD | Freq: Every day | CUTANEOUS | Status: DC
  Administered 2019-04-11 – 2019-04-25 (×13): 6 via TOPICAL

## 2019-04-11 MED ORDER — IPRATROPIUM-ALBUTEROL 0.5-2.5 (3) MG/3ML IN SOLN
3.0000 mL | Freq: Four times a day (QID) | RESPIRATORY_TRACT | Status: DC
  Administered 2019-04-11 – 2019-04-12 (×5): 3 mL via RESPIRATORY_TRACT
  Filled 2019-04-11 (×5): qty 3

## 2019-04-11 MED ORDER — METHYLPREDNISOLONE SODIUM SUCC 40 MG IJ SOLR
40.0000 mg | Freq: Two times a day (BID) | INTRAMUSCULAR | Status: DC
  Administered 2019-04-11 – 2019-04-15 (×7): 40 mg via INTRAVENOUS
  Filled 2019-04-11 (×7): qty 1

## 2019-04-11 MED ORDER — WARFARIN SODIUM 5 MG PO TABS
10.0000 mg | ORAL_TABLET | Freq: Once | ORAL | Status: AC
  Administered 2019-04-11: 19:00:00 10 mg via ORAL
  Filled 2019-04-11: qty 2

## 2019-04-11 MED ORDER — ALBUTEROL SULFATE (2.5 MG/3ML) 0.083% IN NEBU: 2.5000 mg | INHALATION_SOLUTION | RESPIRATORY_TRACT | Status: DC | PRN

## 2019-04-11 MED ORDER — ALBUMIN HUMAN 25 % IV SOLN
25.0000 g | Freq: Once | INTRAVENOUS | Status: AC
  Administered 2019-04-11: 25 g via INTRAVENOUS

## 2019-04-11 MED ORDER — MIDODRINE HCL 5 MG PO TABS
10.0000 mg | ORAL_TABLET | ORAL | Status: DC | PRN
  Administered 2019-04-11: 10 mg via ORAL
  Filled 2019-04-11 (×2): qty 2

## 2019-04-11 MED ORDER — ONDANSETRON HCL 4 MG/2ML IJ SOLN
4.0000 mg | Freq: Four times a day (QID) | INTRAMUSCULAR | Status: DC | PRN
  Administered 2019-04-11 – 2019-04-18 (×2): 4 mg via INTRAVENOUS
  Filled 2019-04-11 (×2): qty 2

## 2019-04-11 NOTE — Progress Notes (Signed)
PROGRESS NOTE    Sabrina Beck  DDU:[id number] DOB: Jun 03, 1941 DOA: 04/08/2019 PCP: Redmond School, MD     Brief Narrative:  78 y.o. 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  Patient had been getting progressively short of breath over 5 days.  She reports that it felt like she just could not take a deep breath in.  Patient denies any chest pain, coughing, fever, dizziness.  Patient has been admitted for pneumonia before in January 2020.  This episode feels like that episode only not as bad.  Patient reports no sick contacts.  Covid test negative.  ED course Chemistry shows a glucose of 140, BUN of 45, creatinine of 9.98, and GFR of 3.  Patient has a BNP of 344.  Initial troponin is 20.  Hematology reveals no leukocytosis, but a macrocytic anemia with a hemoglobin of 11.  Chest x-ray was done that shows enlargement of cardiac silhouette.  Right lower lobe consolidation consistent with pneumonia.  Blood cultures drawn.  Lactic acid 1.6.  Troponin stable at 22.  Second lactic acid 1.3.   Assessment & Plan: 1-shortness of breath and hypoxia: In the setting of healthcare associated pneumonia and presumed acute on chronic diastolic heart failure. -Continue IV antibiotics, using only cefepime at this time -Appreciate nephrology service and assistance. -If needed might benefit of the use of midodrine prior to dialysis to have better blood pressure control and obtain lower dry weight. -Plan is for hemodialysis later today. -Repeated chest x-ray at with positive interstitial edema and vascular congestion. -Continue DuoNeb and supportive care. -Follow culture results -Continue Mucinex, flutter valve and continue the use of Pulmicort. -Continue weaning oxygen supplementation as tolerated  2-end-stage  renal disease -Continue hemodialysis -Follow nephrology recommendations.  3-chronic atrial fibrillation -Rate controlled and stable -Continue metoprolol for rate management and continue warfarin for secondary prevention (pharmacy to dose).   -Monitoring on telemetry. -Patient reports no palpitations.  4-secondary hyperparathyroidism -Continue Renvela  5-anemia of chronic disease -Associated with chronic renal failure -Follow recommendations by nephrology for IV iron and Epogen therapy. -Normal B12 level.  6-morbid obesity -Low calorie diet, portion control and increase physical activity discussed with patient -Body mass index is 41.74 kg/m.  7-gastroesophageal reflux disease -Continue PPI.  8-hypertension -Stable and well-controlled -Continue metoprolol   DVT prophylaxis: Chronically on warfarin Code Status: Full code Family Communication: Son at bedside Disposition Plan: Remains inpatient, continue IV cefepime; follow nephrology recommendations and further attempt of improvement in vascular congestion with ultrafiltration and hemodialysis today.    Consultants:   Nephrology service  Procedures:   See below for x-ray reports  Antimicrobials:  Anti-infectives (From admission, onward)   Start     Dose/Rate Route Frequency Ordered Stop   04/11/19 1600  vancomycin (VANCOCIN) IVPB 1000 mg/200 mL premix  Status:  Discontinued     1,000 mg 200 mL/hr over 60 Minutes Intravenous Once per day on Mon Wed Fri 04/09/19 1755 04/10/19 1003   04/10/19 1800  ceFEPIme (MAXIPIME) 500 mg in dextrose 5 % 50 mL IVPB  Status:  Discontinued     500 mg 100 mL/hr over 30 Minutes Intravenous Every 24 hours 04/09/19 1749 04/09/19 1750   04/09/19 1900  azithromycin (ZITHROMAX) 500 mg in sodium chloride 0.9 % 250 mL IVPB  Status:  Discontinued     500 mg 250 mL/hr over 60  Minutes Intravenous Every 24 hours 04/08/19 2300 04/11/19 0955   04/09/19 1800  cefTRIAXone (ROCEPHIN) 2 g in sodium  chloride 0.9 % 100 mL IVPB  Status:  Discontinued     2 g 200 mL/hr over 30 Minutes Intravenous Every 24 hours 04/08/19 2300 04/09/19 1725   04/09/19 1800  ceFEPIme (MAXIPIME) 1 g in sodium chloride 0.9 % 100 mL IVPB  Status:  Discontinued     1 g 200 mL/hr over 30 Minutes Intravenous  Once 04/09/19 1725 04/09/19 1750   04/09/19 1800  vancomycin (VANCOCIN) IVPB 1000 mg/200 mL premix     1,000 mg 200 mL/hr over 60 Minutes Intravenous Every 1 hr x 2 04/09/19 1745 04/09/19 2000   04/09/19 1800  ceFEPIme (MAXIPIME) 1 g in sodium chloride 0.9 % 100 mL IVPB     1 g 200 mL/hr over 30 Minutes Intravenous Every 24 hours 04/09/19 1751     04/08/19 1800  cefTRIAXone (ROCEPHIN) 1 g in sodium chloride 0.9 % 100 mL IVPB     1 g 200 mL/hr over 30 Minutes Intravenous  Once 04/08/19 1750 04/08/19 2035   04/08/19 1800  azithromycin (ZITHROMAX) 500 mg in sodium chloride 0.9 % 250 mL IVPB     500 mg 250 mL/hr over 60 Minutes Intravenous  Once 04/08/19 1750 04/08/19 2035       Subjective: No fever, no chest pain, no nausea, no vomiting.  Continues to require oxygen supplementation even is down to 1.5 L through nasal cannula.  Reports overall improvement in breathing.  Objective: Vitals:   04/10/19 2100 04/11/19 0022 04/11/19 0556 04/11/19 0835  BP: (!) 147/71 128/70 117/67   Pulse: (!) 101 76 (!) 103   Resp: (!) 24  (!) 24   Temp: 98.9 F (37.2 C)  (!) 97.3 F (36.3 C)   TempSrc: Oral  Oral   SpO2: 98%  96% 96%  Weight:      Height:        Intake/Output Summary (Last 24 hours) at 04/11/2019 1112 Last data filed at 04/11/2019 0300 Gross per 24 hour  Intake 2312.51 ml  Output -  Net 2312.51 ml   Filed Weights   04/08/19 1501 04/09/19 1545  Weight: 109.8 kg 110.3 kg    Examination: General exam: Alert, awake, oriented x 3; denies chest pain reports breathing is improving.  Still dyspneic, having mild difficulty speaking in full sentences and very short of breath on exertion.  Denies PND.  No  nausea or vomiting. Respiratory system: Fine crackles at the bases (right more than left), positive rhonchi, no wheezing.  No using accessory muscles.   Cardiovascular system:Rate controlled; no rubs, no gallops.  Unable to assess JVD with body habitus. Gastrointestinal system: Abdomen is nondistended, soft and nontender. No organomegaly or masses felt. Normal bowel sounds heard. Central nervous system: Alert and oriented. No focal neurological deficits. Extremities/skin: No cyanosis or clubbing, trace edema appreciated bilaterally.  No open wounds, no petechiae.  Left forearm with AV fistula with good bruit. Psychiatry: Judgement and insight appear normal. Mood & affect appropriate.    Data Reviewed: I have personally reviewed following labs and imaging studies  CBC: Recent Labs  Lab 04/08/19 1600 04/09/19 0558 04/10/19 0437 04/11/19 0937  WBC 8.0 7.1 6.5 6.8  NEUTROABS  --  4.8  --   --   HGB 11.0* 9.8* 8.9* 9.3*  HCT 33.9* 30.4*  28.8* 27.9* 28.8*  MCV 110.1* 110.9* 111.2* 109.9*  PLT 146* 141* 130* 154  Basic Metabolic Panel: Recent Labs  Lab 04/08/19 1600 04/09/19 0558 04/10/19 0437 04/11/19 0937  NA 138 139 137 135  K 4.1 4.9 3.7 5.0  CL 99 104 97* 96*  CO2 25 24 30 26   GLUCOSE 140* 110* 117* 123*  BUN 45* 51* 24* 46*  CREATININE 9.98* 11.14* 6.04* 9.25*  CALCIUM 9.8 9.2 9.5 9.6  PHOS  --   --  4.3 7.5*   GFR: Estimated Creatinine Clearance: 6 mL/min (A) (by C-G formula based on SCr of 9.25 mg/dL (H)).   Liver Function Tests: Recent Labs  Lab 04/09/19 0558 04/10/19 0437 04/11/19 0937  AST 13*  --   --   ALT 22  --   --   ALKPHOS 60  --   --   BILITOT 0.7  --   --   PROT 6.1*  --   --   ALBUMIN 3.2* 3.6 3.7   Coagulation Profile: Recent Labs  Lab 04/08/19 1601 04/10/19 0437 04/11/19 0434  INR 1.8* 1.7* 1.7*   Anemia Panel: Recent Labs    04/09/19 0558  VITAMINB12 467   Urine analysis:    Component Value Date/Time   COLORURINE YELLOW  03/03/2012 1500   APPEARANCEUR CLEAR 03/03/2012 1500   LABSPEC 1.020 03/03/2012 1500   PHURINE 6.0 03/03/2012 1500   GLUCOSEU 100 (A) 03/03/2012 1500   HGBUR TRACE (A) 03/03/2012 1500   BILIRUBINUR NEGATIVE 03/03/2012 1500   KETONESUR NEGATIVE 03/03/2012 1500   PROTEINUR 100 (A) 03/03/2012 1500   UROBILINOGEN 0.2 03/03/2012 1500   NITRITE NEGATIVE 03/03/2012 1500   LEUKOCYTESUR NEGATIVE 03/03/2012 1500    Recent Results (from the past 240 hour(s))  SARS Coronavirus 2 [facility] order, Performed in [facility] hospital lab) Nasopharyngeal Nasopharyngeal Swab     Status: None   Collection Time: 04/08/19  5:50 PM   Specimen: Nasopharyngeal Swab  Result Value Ref Range Status   SARS Coronavirus 2 NEGATIVE NEGATIVE Final    Comment: (NOTE) If result is NEGATIVE SARS-CoV-2 target nucleic acids are NOT DETECTED. The SARS-CoV-2 RNA is generally detectable in upper and lower  respiratory specimens during the acute phase of infection. The lowest  concentration of SARS-CoV-2 viral copies this assay can detect is 250  copies / mL. A negative result does not preclude SARS-CoV-2 infection  and should not be used as the sole basis for treatment or other  patient management decisions.  A negative result may occur with  improper specimen collection / handling, submission of specimen other  than nasopharyngeal swab, presence of viral mutation(s) within the  areas targeted by this assay, and inadequate number of viral copies  (<250 copies / mL). A negative result must be combined with clinical  observations, patient history, and epidemiological information. If result is POSITIVE SARS-CoV-2 target nucleic acids are DETECTED. The SARS-CoV-2 RNA is generally detectable in upper and lower  respiratory specimens dur ing the acute phase of infection.  Positive  results are indicative of active infection with SARS-CoV-2.  Clinical  correlation with patient history and other diagnostic information is   necessary to determine patient infection status.  Positive results do  not rule out bacterial infection or co-infection with other viruses. If result is PRESUMPTIVE POSTIVE SARS-CoV-2 nucleic acids MAY BE PRESENT.   A presumptive positive result was obtained on the submitted specimen  and confirmed on repeat testing.  While 2019 novel coronavirus  (SARS-CoV-2) nucleic acids may be present in the submitted sample  additional confirmatory testing may be necessary  for epidemiological  and / or clinical management purposes  to differentiate between  SARS-CoV-2 and other Sarbecovirus currently known to infect humans.  If clinically indicated additional testing with an alternate test  methodology [phone]) is advised. The SARS-CoV-2 RNA is generally  detectable in upper and lower respiratory sp ecimens during the acute  phase of infection. The expected result is Negative. Fact Sheet for Patients:  StrictlyIdeas.no Fact Sheet for Healthcare Providers: BankingDealers.co.za This test is not yet approved or cleared by the [place] FDA and has been authorized for detection and/or diagnosis of SARS-CoV-2 by FDA under an Emergency Use Authorization (EUA).  This EUA will remain in effect (meaning this test can be used) for the duration of the COVID-19 declaration under Section 564(b)(1) of the Act, 21 U.S.C. section 360bbb-3(b)(1), unless the authorization is terminated or revoked sooner. Performed at [facility], [address] [place], [place] [address]   Culture, blood (routine x 2)     Status: None (Preliminary result)   Collection Time: 04/08/19  6:21 PM   Specimen: BLOOD  Result Value Ref Range Status   Specimen Description BLOOD RIGHT ANTECUBITAL  Final   Special Requests   Final    BOTTLES DRAWN AEROBIC AND ANAEROBIC Blood Culture adequate volume   Culture   Final    NO GROWTH 3 DAYS Performed at [facility], [address],  [place], [place] [address]    Report Status PENDING  Incomplete  Culture, blood (routine x 2)     Status: None (Preliminary result)   Collection Time: 04/08/19  6:22 PM   Specimen: BLOOD  Result Value Ref Range Status   Specimen Description BLOOD RIGHT ANTECUBITAL  Final   Special Requests   Final    BOTTLES DRAWN AEROBIC AND ANAEROBIC Blood Culture adequate volume   Culture   Final    NO GROWTH 3 DAYS Performed at [facility], [address] [place], [place] [address]    Report Status PENDING  Incomplete  MRSA PCR Screening     Status: None   Collection Time: 04/09/19  9:45 PM   Specimen: Nasopharyngeal  Result Value Ref Range Status   MRSA by PCR NEGATIVE NEGATIVE Final    Comment:        The GeneXpert MRSA Assay (FDA approved for NASAL specimens only), is one component of a comprehensive MRSA colonization surveillance program. It is not intended to diagnose MRSA infection nor to guide or monitor treatment for MRSA infections. Performed at [facility], [address], [place],  [address]     Radiology Studies: Dg Chest 2 View  Result Date: 04/10/2019 CLINICAL DATA:  Shortness of breath. EXAM: CHEST - 2 VIEW COMPARISON:  04/08/2019 and 08/18/2018 FINDINGS: Cardiac size is within normal limits. Aortic atherosclerosis. New pulmonary vascular congestion. New bilateral perihilar infiltrates. Persistent hazy infiltrate at the right lung base posteriorly. Minimal blunting of the posterior costophrenic angles. No acute bone abnormality. IMPRESSION: New bilateral perihilar infiltrates. Persistent infiltrate at the right lung base. This could represent pulmonary edema or multifocal progressive pneumonia. Aortic Atherosclerosis (ICD10-I70.0). Electronically Signed   By: Lorriane Shire M.D.   On: 04/10/2019 11:04    Scheduled Meds: . budesonide (PULMICORT) nebulizer solution  0.5 mg Nebulization BID  . cholecalciferol  2,000 Units Oral QPM  . doxercalciferol  8 mcg Intravenous Q  M,W,F-HD  . epoetin (EPOGEN/PROCRIT) injection  2,800 Units Intravenous Q M,W,F-HD  . gabapentin  100 mg Oral BID  . metoprolol tartrate  50  mg Oral QHS  . metoprolol tartrate  50 mg Oral Once per day on Sun Tue Thu Sat  . pantoprazole  40 mg Oral Daily  . sevelamer carbonate  2,400 mg Oral TID WC  . warfarin  10 mg Oral Once  . Warfarin - Pharmacist Dosing Inpatient   Does not apply q1800   Continuous Infusions: . sodium chloride 50 mL/hr at 04/10/19 1600  . sodium chloride    . sodium chloride    . ceFEPime (MAXIPIME) IV 1 g (04/10/19 1732)     LOS: 1 day    Time spent: 30 minutes    Barton Dubois, MD [facility] Pager [phone]   04/11/2019, 11:12 AM

## 2019-04-11 NOTE — Progress Notes (Signed)
Nurse contacted me with concerns for increase work of breathing and oxygen demand. Earlier patient with transient AMS and slow to response. During HD despite albumin and midodrine only 1L removed.  Plan: -will transfer to stepdown for close monitoring and if needed initiate BIPAP. -check ABG -repeat CXR -start low dose IV steroids -continue nebulizer management. -discussed with Dr. Nehemiah Settle for bedside evaluation.  Barton Dubois MD [phone]

## 2019-04-11 NOTE — Progress Notes (Signed)
Patient ID: Sabrina Beck, female   DOB: 09-03-1940, 78 y.o.   MRN: [id number] S: No events overnight O:BP 117/67 (BP Location: Right Arm)   Pulse (!) 103   Temp (!) 97.3 F (36.3 C) (Oral)   Resp (!) 24   Ht 5\' 4"  (1.626 m)   Wt 110.3 kg   SpO2 96%   BMI 41.74 kg/m   Intake/Output Summary (Last 24 hours) at 04/11/2019 8295 Last data filed at 04/11/2019 0300 Gross per 24 hour  Intake 2312.51 ml  Output -  Net 2312.51 ml   Intake/Output: I/O last 3 completed shifts: In: 2552.5 [P.O.:600; I.V.:1702.5; IV Piggyback:250] Out: 2058 [Other:2058]  Intake/Output this shift:  No intake/output data recorded. Weight change:  Gen: NAD CVS: tachy at 103, no rub Resp: crackles at right base, some audible exp wheezing Abd: +BS, obese, NT Ext: no edema, LAVF +T/B  Recent Labs  Lab 04/08/19 1600 04/09/19 0558 04/10/19 0437  NA 138 139 137  K 4.1 4.9 3.7  CL 99 104 97*  CO2 25 24 30   GLUCOSE 140* 110* 117*  BUN 45* 51* 24*  CREATININE 9.98* 11.14* 6.04*  ALBUMIN  --  3.2* 3.6  CALCIUM 9.8 9.2 9.5  PHOS  --   --  4.3  AST  --  13*  --   ALT  --  22  --    Liver Function Tests: Recent Labs  Lab 04/09/19 0558 04/10/19 0437  AST 13*  --   ALT 22  --   ALKPHOS 60  --   BILITOT 0.7  --   PROT 6.1*  --   ALBUMIN 3.2* 3.6   No results for input(s): LIPASE, AMYLASE in the last 168 hours. No results for input(s): AMMONIA in the last 168 hours. CBC: Recent Labs  Lab 04/08/19 1600 04/09/19 0558 04/10/19 0437  WBC 8.0 7.1 6.5  NEUTROABS  --  4.8  --   HGB 11.0* 9.8* 8.9*  HCT 33.9* 30.4*  28.8* 27.9*  MCV 110.1* 110.9* 111.2*  PLT 146* 141* 130*   Cardiac Enzymes: No results for input(s): CKTOTAL, CKMB, CKMBINDEX, TROPONINI in the last 168 hours. CBG: No results for input(s): GLUCAP in the last 168 hours.  Iron Studies: No results for input(s): IRON, TIBC, TRANSFERRIN, FERRITIN in the last 72 hours. Studies/Results: Dg Chest 2 View  Result Date: 04/10/2019 CLINICAL  DATA:  Shortness of breath. EXAM: CHEST - 2 VIEW COMPARISON:  04/08/2019 and 08/18/2018 FINDINGS: Cardiac size is within normal limits. Aortic atherosclerosis. New pulmonary vascular congestion. New bilateral perihilar infiltrates. Persistent hazy infiltrate at the right lung base posteriorly. Minimal blunting of the posterior costophrenic angles. No acute bone abnormality. IMPRESSION: New bilateral perihilar infiltrates. Persistent infiltrate at the right lung base. This could represent pulmonary edema or multifocal progressive pneumonia. Aortic Atherosclerosis (ICD10-I70.0). Electronically Signed   By: Lorriane Shire M.D.   On: 04/10/2019 11:04   . budesonide (PULMICORT) nebulizer solution  0.5 mg Nebulization BID  . cholecalciferol  2,000 Units Oral QPM  . doxercalciferol  8 mcg Intravenous Q M,W,F-HD  . epoetin (EPOGEN/PROCRIT) injection  2,800 Units Intravenous Q M,W,F-HD  . gabapentin  100 mg Oral BID  . metoprolol tartrate  50 mg Oral QHS  . metoprolol tartrate  50 mg Oral Once per day on Sun Tue Thu Sat  . pantoprazole  40 mg Oral Daily  . sevelamer carbonate  2,400 mg Oral TID WC  . warfarin  10 mg Oral Once  .  Warfarin - Pharmacist Dosing Inpatient   Does not apply q1800    BMET    Component Value Date/Time   NA 137 04/10/2019 0437   NA 142 06/08/2015 0735   K 3.7 04/10/2019 0437   CL 97 (L) 04/10/2019 0437   CO2 30 04/10/2019 0437   GLUCOSE 117 (H) 04/10/2019 0437   BUN 24 (H) 04/10/2019 0437   BUN 37 (H) 06/08/2015 0735   CREATININE 6.04 (H) 04/10/2019 0437   CALCIUM 9.5 04/10/2019 0437   CALCIUM 8.1 (L) 01/11/2012 1039   GFRNONAA 6 (L) 04/10/2019 0437   GFRAA 7 (L) 04/10/2019 0437   CBC    Component Value Date/Time   WBC 6.5 04/10/2019 0437   RBC 2.51 (L) 04/10/2019 0437   HGB 8.9 (L) 04/10/2019 0437   HCT 27.9 (L) 04/10/2019 0437   HCT 28.8 (L) 04/09/2019 0558   PLT 130 (L) 04/10/2019 0437   MCV 111.2 (H) 04/10/2019 0437   MCH 35.5 (H) 04/10/2019 0437   MCHC  31.9 04/10/2019 0437   RDW 13.5 04/10/2019 0437   LYMPHSABS 1.4 04/09/2019 0558   MONOABS 0.7 04/09/2019 0558   EOSABS 0.1 04/09/2019 0558   BASOSABS 0.1 04/09/2019 0558    [facility]: [id number] Bath 2K/2.5CaTime 3.5 hrsHeparin none. AccessLAVFBFR 400DFR 600  Hectoral 9.5mcg IV/HD Epogen 2,800Units IV/HD   Assessment/Plan: 1. HCAP- crackles in RLL persist despite HD with UF consistent with pneumonia.  Continue with abx per primary svc.  Currently on cefepime and off of vanco as MRSA neg. 2. DOE- persists despite UF of 2 liters yesterday.  Likely related to HCAP- will plan for HD with UF again today and follow respiratory response. Agree with treating for HCAP for now and follow.  Will need PT/OT as well as home O2 eval if persists. 3. ESRD- HD today to keep on schedule and weigh standing up on a scale as bed scale is likely off.  Will try midodrine prior to HD to help maintain BP and help with UF. 4. Hypertension/volume- has crackles at bases and trace pretibial edema. Weight is up 7 kg but doubt she is that overloaded as she does not have a history of large idwg per her primary Nephrology PA. Plan for uf as bp tolerates today and weigh standing up if able 5. Anemia- Cont with ESA and follow. Hgb continues to drop.  Guaiac stools and follow h/h. 1. Will check cbc prior to HD today. 6. Metabolic bone disease- Cont with home meds 7. Nutrition- renal diet  Donetta Potts, MD [facility] [phone]

## 2019-04-11 NOTE — Progress Notes (Signed)
On am rounds A/O x4 and pending dialysis. Rapid response call at 1140. Mumbled speech and decreased LOC. Has since resolved. VSS. CT done and pending. No Code stroke per Dr. Dyann Kief.  Please see flow sheet assessment for details on neuro assessment.

## 2019-04-11 NOTE — Procedures (Signed)
    HEMODIALYSIS TREATMENT NOTE:  Pre-weight 104.1kg on standing scale (EDW 103kg).  Midodrine '10mg'$  given prior to dialysis.    4 hour heparin--free session completed via left forearm AVF (15g/antegrade). Goal NOT met due to hypotension despite cooling dialysate, use of SVS, and administration of Albumin 25g x2 for SBP<95. Net UF 1.1 L.  All blood was returned and hemostasis was achieved in 15 minutes.  Post HD vitals: 100/52 / 82 / spO2 93% on 2L.  Pt denied dyspnea or chest pain but appeared to have labored breathing.  Primary RN d/w attending and pt transferred to stepdown unit for closer observation.  Rockwell Alexandria, RN

## 2019-04-11 NOTE — Care Management Important Message (Signed)
Important Message  Patient Details  Name: Sabrina Beck MRN: [id number] Date of Birth: July 28, 1941   Medicare Important Message Given:  Yes     Tommy Medal 04/11/2019, 2:09 PM

## 2019-04-11 NOTE — Progress Notes (Signed)
ANTICOAGULATION CONSULT NOTE   Pharmacy Consult for Coumadin Indication: atrial fibrillation  No Known Allergies  Patient Measurements: Height: 5\' 4"  (162.6 cm) Weight: 243 lb 2.7 oz (110.3 kg) IBW/kg (Calculated) : 54.7  Vital Signs: Temp: 97.3 F (36.3 C) (09/04 0556) Temp Source: Oral (09/04 0556) BP: 117/67 (09/04 0556) Pulse Rate: 103 (09/04 0556)  Labs: Recent Labs    04/08/19 1600 04/08/19 1601 04/08/19 1822 04/09/19 0558 04/10/19 0437 04/11/19 0434  HGB 11.0*  --   --  9.8* 8.9*  --   HCT 33.9*  --   --  30.4*  28.8* 27.9*  --   PLT 146*  --   --  141* 130*  --   LABPROT  --  20.8*  --   --  19.5* 19.6*  INR  --  1.8*  --   --  1.7* 1.7*  CREATININE 9.98*  --   --  11.14* 6.04*  --   TROPONINIHS 20*  --  22*  --   --   --     Estimated Creatinine Clearance: 9.2 mL/min (A) (by C-G formula based on SCr of 6.04 mg/dL (H)).   Medical History: Past Medical History:  Diagnosis Date  . Anemia   . Atrial fibrillation ([place])   . Chest pain 05/15/2006   Stress test negative for ischemia  . Diastolic heart failure ([place])   . Diverticulosis   . ESRD on hemodialysis ([place])   . GERD (gastroesophageal reflux disease)   . Headache   . Mixed hyperlipidemia   . Peripheral neuropathy   . PONV (postoperative nausea and vomiting)   . Systemic hypertension   . Uterine cancer ([facility])     Medications:  Medications Prior to Admission  Medication Sig Dispense Refill Last Dose  . acetaminophen (TYLENOL) 500 MG tablet Take 1,000 mg by mouth every 6 (six) hours as needed for moderate pain.    Past Week at Unknown time  . Cholecalciferol (VITAMIN D) 2000 UNITS CAPS Take 2,000 Units by mouth every evening.    04/07/2019 at Unknown time  . gabapentin (NEURONTIN) 100 MG capsule Take 100 mg by mouth 2 (two) times daily.    04/08/2019 at Unknown time  . metoprolol tartrate (LOPRESSOR) 50 MG tablet Take 1 tablet (50 mg total) by mouth 2 (two) times daily. (Patient taking differently:  Take 50 mg by mouth See admin instructions. Take one tablet every day at bedtime. Take one tablet every day in the morning on days that are non-dialysis days (T-TH-S-S)) 60 tablet 6 04/08/2019 at [place]  . Multiple Vitamins-Minerals (PRESERVISION AREDS 2 PO) Take 1 tablet by mouth 2 (two) times daily.   04/08/2019 at Unknown time  . multivitamin (RENA-VIT) TABS tablet Take 1 tablet by mouth every evening.    04/07/2019 at Unknown time  . Omega-3 Fatty Acids (OMEGA-3 FISH OIL) 300 MG CAPS Take 300 mg by mouth 2 (two) times daily.   04/08/2019 at Unknown time  . omeprazole (PRILOSEC) 20 MG capsule Take 20 mg by mouth every morning.    04/08/2019 at Unknown time  . Propylene Glycol (SYSTANE BALANCE) 0.6 % SOLN Place 1 drop into both eyes 3 (three) times daily as needed (for burning or dry eyes).    Past Month at Unknown time  . RENVELA 800 MG tablet Take 2,400 mg by mouth 3 (three) times daily with meals. Takes 1 tablet with snacks   04/08/2019 at Unknown time  . warfarin (COUMADIN) 5 MG tablet Take 1.5  to 2 tablets by mouth daily as directed by coumadin clinic (Patient taking differently: Take 7.5-10 mg by mouth See admin instructions. 10mg  (10mg  total) on Mondays, then take 7.5mg  (1.5 tablets) on all other days in the evening.) 150 tablet 1 04/07/2019 at 2000    Assessment: 78 y.o.female,with history of uterine cancer, hypertension, peripheral neuropathy, hyperlipidemia, GERD, ESRD on dialysis M-W-F, diastolic heart failure, atrial fibrillation, and anemia presents to the ED with shortness of breath. Patient had been getting progressively short of breath over 5 days. Patient chronically anticoagulated with coumadin. Pharmacy asked to monitor and dose coumadin. INR has been hovering around 1.8 to 1.7, will give slight booster dose today. Home dose is 10mg  on Monday and 7.5mg  all other days.  Goal of Therapy:  INR 2-3 Monitor platelets by anticoagulation protocol: Yes   Plan:  Coumadin 10mg  po x 1 today Daily  PT-INR Monitor for s/s of bleeding  Margot Ables, PharmD Clinical Pharmacist 04/11/2019 8:26 AM

## 2019-04-11 NOTE — Progress Notes (Signed)
Spoke with physician about patient on his inquiry. Oxygen was raised to 4LPM via [place] prior to call due to grunting with respiration after RT admin of breathing treatment. SPO2 probe changed. Sats still showed 86 +/- After increasing O2 to 4 LPM via  patient is now SPO2 96% and no grunting. She states that it is "easier to breathe now."

## 2019-04-11 NOTE — TOC Transition Note (Signed)
Transition of Care [facility]) - CM/SW Discharge Note   Patient Details  Name: Sabrina Beck MRN: [id number] Date of Birth: 12-Feb-1941  [facility] CM/SW Contact:  Ihor Gully, LCSW Phone Number: 04/11/2019, 2:04 PM   Clinical Narrative:    Discussed with patient the possibility of her needing home oxygen at discharge. Provided choices.  Patient states that she will think about it. Juliann Pulse at [employer] is aware of patient's potential for oxygen. Patient is currently in dialysis and unable to do oxygen qualifying note currently. RN aware of need and will inform next shift RN of need.      Barriers to Discharge: No Barriers Identified   Patient Goals and CMS Choice Patient states their goals for this hospitalization and ongoing recovery are:: return home and get back to baseline.      Discharge Placement                       Discharge Plan and Services                                     Social Determinants of Health (SDOH) Interventions     Readmission Risk Interventions Readmission Risk Prevention Plan 04/10/2019  Transportation Screening Complete  Social Work Consult for [place] Planning/Counseling [place] Not Applicable  Medication Review [employer]) Complete  Some recent data might be hidden

## 2019-04-11 NOTE — Progress Notes (Signed)
Pt with increase WOB post Dialysis. Rate 28. Pulse OX 92% on 2L via N/C.  Lungs sounds decreased throughout. Denies SOB. Remained A/Ox4. Dr. Dyann Kief notified of resp status and I liter removed via dialysis. Order obtained to transfer to Step Down. Transferred and stable. Report given and received by [place],RN. Son at bedside during transfer (has pt belongings and glasses).

## 2019-04-12 LAB — RENAL FUNCTION PANEL
Albumin: 4 g/dL (ref 3.5–5.0)
Anion gap: 13 (ref 5–15)
BUN: 25 mg/dL — ABNORMAL HIGH (ref 8–23)
CO2: 28 mmol/L (ref 22–32)
Calcium: 9.8 mg/dL (ref 8.9–10.3)
Chloride: 95 mmol/L — ABNORMAL LOW (ref 98–111)
Creatinine, Ser: 5.08 mg/dL — ABNORMAL HIGH (ref 0.44–1.00)
GFR calc Af Amer: 9 mL/min — ABNORMAL LOW (ref >60)
GFR calc non Af Amer: 8 mL/min — ABNORMAL LOW (ref >60)
Glucose, Bld: 154 mg/dL — ABNORMAL HIGH (ref 70–99)
Phosphorus: 6.1 mg/dL — ABNORMAL HIGH (ref 2.5–4.6)
Potassium: 4.2 mmol/L (ref 3.5–5.1)
Sodium: 136 mmol/L (ref 135–145)

## 2019-04-12 LAB — PROTIME-INR
INR: 1.7 — ABNORMAL HIGH (ref 0.8–1.2)
Prothrombin Time: 20.1 seconds — ABNORMAL HIGH (ref 11.4–15.2)

## 2019-04-12 MED ORDER — IPRATROPIUM-ALBUTEROL 0.5-2.5 (3) MG/3ML IN SOLN
3.0000 mL | Freq: Three times a day (TID) | RESPIRATORY_TRACT | Status: DC
  Administered 2019-04-13 (×3): 3 mL via RESPIRATORY_TRACT
  Filled 2019-04-12 (×5): qty 3

## 2019-04-12 MED ORDER — ORAL CARE MOUTH RINSE
15.0000 mL | Freq: Two times a day (BID) | OROMUCOSAL | Status: DC
  Administered 2019-04-12 – 2019-04-25 (×26): 15 mL via OROMUCOSAL

## 2019-04-12 MED ORDER — CHLORHEXIDINE GLUCONATE CLOTH 2 % EX PADS
6.0000 | MEDICATED_PAD | Freq: Every day | CUTANEOUS | Status: DC
  Administered 2019-04-13 – 2019-04-22 (×7): 6 via TOPICAL

## 2019-04-12 MED ORDER — WARFARIN SODIUM 5 MG PO TABS
12.0000 mg | ORAL_TABLET | Freq: Once | ORAL | Status: AC
  Administered 2019-04-12: 12 mg via ORAL
  Filled 2019-04-12: qty 2

## 2019-04-12 MED ORDER — METOPROLOL TARTRATE 5 MG/5ML IV SOLN
5.0000 mg | Freq: Once | INTRAVENOUS | Status: AC
  Administered 2019-04-12: 5 mg via INTRAVENOUS
  Filled 2019-04-12: qty 5

## 2019-04-12 NOTE — Progress Notes (Signed)
ANTICOAGULATION CONSULT NOTE   Pharmacy Consult for Coumadin Indication: atrial fibrillation  No Known Allergies  Patient Measurements: Height: 5\' 4"  (162.6 cm) Weight: 233 lb 14.5 oz (106.1 kg) IBW/kg (Calculated) : 54.7  Vital Signs: Temp: 97.7 F (36.5 C) (09/05 0900) Temp Source: Axillary (09/05 0900) BP: 110/57 (09/05 0600) Pulse Rate: 90 (09/05 0600)  Labs: Recent Labs    04/10/19 0437 04/11/19 0434 04/11/19 0937 04/12/19 0348  HGB 8.9*  --  9.3*  --   HCT 27.9*  --  28.8*  --   PLT 130*  --  154  --   LABPROT 19.5* 19.6*  --  20.1*  INR 1.7* 1.7*  --  1.7*  CREATININE 6.04*  --  9.25* 5.08*    Estimated Creatinine Clearance: 10.7 mL/min (A) (by C-G formula based on SCr of 5.08 mg/dL (H)).   Medical History: Past Medical History:  Diagnosis Date  . Anemia   . Atrial fibrillation ([place])   . Chest pain 05/15/2006   Stress test negative for ischemia  . Diastolic heart failure ([place])   . Diverticulosis   . ESRD on hemodialysis ([place])   . GERD (gastroesophageal reflux disease)   . Headache   . Mixed hyperlipidemia   . Peripheral neuropathy   . PONV (postoperative nausea and vomiting)   . Systemic hypertension   . Uterine cancer ([facility])     Medications:  Medications Prior to Admission  Medication Sig Dispense Refill Last Dose  . acetaminophen (TYLENOL) 500 MG tablet Take 1,000 mg by mouth every 6 (six) hours as needed for moderate pain.    Past Week at Unknown time  . Cholecalciferol (VITAMIN D) 2000 UNITS CAPS Take 2,000 Units by mouth every evening.    04/07/2019 at Unknown time  . gabapentin (NEURONTIN) 100 MG capsule Take 100 mg by mouth 2 (two) times daily.    04/08/2019 at Unknown time  . metoprolol tartrate (LOPRESSOR) 50 MG tablet Take 1 tablet (50 mg total) by mouth 2 (two) times daily. (Patient taking differently: Take 50 mg by mouth See admin instructions. Take one tablet every day at bedtime. Take one tablet every day in the morning on days that are  non-dialysis days (T-TH-S-S)) 60 tablet 6 04/08/2019 at [place]  . Multiple Vitamins-Minerals (PRESERVISION AREDS 2 PO) Take 1 tablet by mouth 2 (two) times daily.   04/08/2019 at Unknown time  . multivitamin (RENA-VIT) TABS tablet Take 1 tablet by mouth every evening.    04/07/2019 at Unknown time  . Omega-3 Fatty Acids (OMEGA-3 FISH OIL) 300 MG CAPS Take 300 mg by mouth 2 (two) times daily.   04/08/2019 at Unknown time  . omeprazole (PRILOSEC) 20 MG capsule Take 20 mg by mouth every morning.    04/08/2019 at Unknown time  . Propylene Glycol (SYSTANE BALANCE) 0.6 % SOLN Place 1 drop into both eyes 3 (three) times daily as needed (for burning or dry eyes).    Past Month at Unknown time  . RENVELA 800 MG tablet Take 2,400 mg by mouth 3 (three) times daily with meals. Takes 1 tablet with snacks   04/08/2019 at Unknown time  . warfarin (COUMADIN) 5 MG tablet Take 1.5 to 2 tablets by mouth daily as directed by coumadin clinic (Patient taking differently: Take 7.5-10 mg by mouth See admin instructions. 10mg  (10mg  total) on Mondays, then take 7.5mg  (1.5 tablets) on all other days in the evening.) 150 tablet 1 04/07/2019 at 2000    Assessment: 78 y.o.female,with history  of uterine cancer, hypertension, peripheral neuropathy, hyperlipidemia, GERD, ESRD on dialysis M-W-F, diastolic heart failure, atrial fibrillation, and anemia presents to the ED with shortness of breath. Patient had been getting progressively short of breath over 5 days. Patient chronically anticoagulated with coumadin. Pharmacy asked to monitor and dose coumadin. INR has been hovering around 1.8 to 1.7, will give slight booster dose today. Home dose is 10mg  on Monday and 7.5mg  all other days.  Goal of Therapy:  INR 2-3 Monitor platelets by anticoagulation protocol: Yes   Plan:  Coumadin 12mg  po x 1 today Daily PT-INR Monitor for s/s of bleeding  Thomasenia Sales, PharmD, MBA, BCGP Clinical Pharmacist  04/12/2019 9:16 AM

## 2019-04-12 NOTE — Progress Notes (Signed)
PT HR 120-130s. Midlevel notified and one time dose of Metoprolol IV given. Will continue to monitor.

## 2019-04-12 NOTE — Progress Notes (Signed)
PROGRESS NOTE    Sabrina Beck  YTK:[id number] DOB: [birth year] DOA: 04/08/2019 PCP: Redmond School, MD     Brief Narrative:  78 y.o. 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  Patient had been getting progressively short of breath over 5 days.  She reports that it felt like she just could not take a deep breath in.  Patient denies any chest pain, coughing, fever, dizziness.  Patient has been admitted for pneumonia before in January 2020.  This episode feels like that episode only not as bad.  Patient reports no sick contacts.  Covid test negative.  ED course Chemistry shows a glucose of 140, BUN of 45, creatinine of 9.98, and GFR of 3.  Patient has a BNP of 344.  Initial troponin is 20.  Hematology reveals no leukocytosis, but a macrocytic anemia with a hemoglobin of 11.  Chest x-ray was done that shows enlargement of cardiac silhouette.  Right lower lobe consolidation consistent with pneumonia.  Blood cultures drawn.  Lactic acid 1.6.  Troponin stable at 22.  Second lactic acid 1.3.   Assessment & Plan: 1-shortness of breath and hypoxia: In the setting of healthcare associated pneumonia and presumed acute on chronic diastolic heart failure. -Continue IV antibiotics, using only cefepime at this time -Appreciate nephrology service and assistance; will follow recommendations for next HD treatment.  -Despite the use of albumin and Midodine only 1 L of fluid was able to be removed during dialysis treatment on 04/11/2019. -Repeated chest x-ray at with positive interstitial edema and vascular congestion. -Continue DuoNeb, Pulmicort and low-dose steroids -Continue follow-up around Mucinex -Continue weaning of oxygen supplementation as tolerated. -Patient in need of further volume status management with  hemodialysis.   2-end-stage renal disease -Continue hemodialysis -Follow nephrology recommendations.  3-chronic atrial fibrillation -Rate controlled and stable -Continue metoprolol for rate management and continue warfarin for secondary prevention (pharmacy to dose).  INR is slightly subtherapeutic. -Monitoring on telemetry. -Patient reports no palpitations.  4-secondary hyperparathyroidism -Continue Renvela  5-anemia of chronic disease -Associated with chronic renal failure -Follow recommendations by nephrology for IV iron and Epogen therapy. -Normal B12 level.  6-morbid obesity -Low calorie diet, portion control and increase physical activity discussed with patient -Body mass index is 40.15 kg/m.  7-gastroesophageal reflux disease -Continue PPI.  8-hypertension -Stable and well-controlled -Continue metoprolol   DVT prophylaxis: Chronically on warfarin Code Status: Full code Family Communication: Son at bedside Disposition Plan: Remains inpatient, continue IV cefepime; follow nephrology recommendations and further attempt of improvement in vascular congestion with ultrafiltration and hemodialysis, given inability of taking too much fluids during use of intermittent treatments, patient might end requiring couple in between his back-to-back treatments to achieved appropriate volume status.  Consultants:   Nephrology service  Procedures:   See below for x-ray reports  Antimicrobials:  Anti-infectives (From admission, onward)   Start     Dose/Rate Route Frequency Ordered Stop   04/11/19 1600  vancomycin (VANCOCIN) IVPB 1000 mg/200 mL premix  Status:  Discontinued     1,000 mg 200 mL/hr over 60 Minutes Intravenous Once per day on Mon Wed Fri 04/09/19 1755 04/10/19 1003   04/10/19 1800  ceFEPIme (MAXIPIME) 500 mg in dextrose 5 % 50 mL IVPB  Status:  Discontinued     500 mg 100 mL/hr over 30 Minutes Intravenous Every 24 hours 04/09/19 1749 04/09/19  1750   04/09/19  1900  azithromycin (ZITHROMAX) 500 mg in sodium chloride 0.9 % 250 mL IVPB  Status:  Discontinued     500 mg 250 mL/hr over 60 Minutes Intravenous Every 24 hours 04/08/19 2300 04/11/19 0955   04/09/19 1800  cefTRIAXone (ROCEPHIN) 2 g in sodium chloride 0.9 % 100 mL IVPB  Status:  Discontinued     2 g 200 mL/hr over 30 Minutes Intravenous Every 24 hours 04/08/19 2300 04/09/19 1725   04/09/19 1800  ceFEPIme (MAXIPIME) 1 g in sodium chloride 0.9 % 100 mL IVPB  Status:  Discontinued     1 g 200 mL/hr over 30 Minutes Intravenous  Once 04/09/19 1725 04/09/19 1750   04/09/19 1800  vancomycin (VANCOCIN) IVPB 1000 mg/200 mL premix     1,000 mg 200 mL/hr over 60 Minutes Intravenous Every 1 hr x 2 04/09/19 1745 04/09/19 2000   04/09/19 1800  ceFEPIme (MAXIPIME) 1 g in sodium chloride 0.9 % 100 mL IVPB     1 g 200 mL/hr over 30 Minutes Intravenous Every 24 hours 04/09/19 1751     04/08/19 1800  cefTRIAXone (ROCEPHIN) 1 g in sodium chloride 0.9 % 100 mL IVPB     1 g 200 mL/hr over 30 Minutes Intravenous  Once 04/08/19 1750 04/08/19 2035   04/08/19 1800  azithromycin (ZITHROMAX) 500 mg in sodium chloride 0.9 % 250 mL IVPB     500 mg 250 mL/hr over 60 Minutes Intravenous  Once 04/08/19 1750 04/08/19 2035      Subjective: No fever, no chest pain, no nausea, no vomiting.  Continues to experience episodes of shortness of breath and is dyspneic on exertion.  Requiring 4 L nasal cannula supplementation.  Overnight transferred to stepdown for closer monitoring after acute respiratory distress.  Objective: Vitals:   04/12/19 0859 04/12/19 0900 04/12/19 1000 04/12/19 1100  BP:  (!) 150/137 100/72   Pulse:  84 (!) 103 (!) 104  Resp:  12 (!) 24 (!) 22  Temp:  97.7 F (36.5 C)    TempSrc:  Axillary    SpO2: 96% 100% 100% 97%  Weight:      Height:        Intake/Output Summary (Last 24 hours) at 04/12/2019 1120 Last data filed at 04/12/2019 1100 Gross per 24 hour  Intake 840 ml  Output 1109 ml  Net  -269 ml   Filed Weights   04/09/19 1545 04/11/19 1330 04/11/19 1829  Weight: 110.3 kg 104.3 kg 106.1 kg    Examination: General exam: Alert, awake, oriented x 3; no focal neurologic deficit appreciated on exam.  Patient is still dyspneic and requiring 4 L nasal cannula supplementation.  She is afebrile and denies any chest pain.  Overnight with increased respiratory distress requiring to be transferred to stepdown for closer monitoring.  Repeat chest x-ray at that time demonstrated vascular congestion.  Due to low blood pressure only 1 L of fluid was able to be removed during dialysis treatment.  No nausea, no vomiting, no overt bleeding. Respiratory system: Decreased breath sounds at the bases, no wheezing, positive rhonchi, fine crackles.  No using accessory muscles. Cardiovascular system:Rate controlled, no rubs, no gallops, unable to properly assess for JVD due to body habitus. Gastrointestinal system: Abdomen is obese, nondistended, soft and nontender. No organomegaly or masses felt. Normal bowel sounds heard. Central nervous system: Alert and oriented. No focal neurological deficits. Extremities/skin: No cyanosis or clubbing; trace edema bilaterally.  No open wounds.  Left  forearm with AV fistula with good bruit. Psychiatry: Judgement and insight appear normal. Mood & affect appropriate.    Data Reviewed: I have personally reviewed following labs and imaging studies  CBC: Recent Labs  Lab 04/08/19 1600 04/09/19 0558 04/10/19 0437 04/11/19 0937  WBC 8.0 7.1 6.5 6.8  NEUTROABS  --  4.8  --   --   HGB 11.0* 9.8* 8.9* 9.3*  HCT 33.9* 30.4*  28.8* 27.9* 28.8*  MCV 110.1* 110.9* 111.2* 109.9*  PLT 146* 141* 130* 093   Basic Metabolic Panel: Recent Labs  Lab 04/08/19 1600 04/09/19 0558 04/10/19 0437 04/11/19 0937 04/12/19 0348  NA 138 139 137 135 136  K 4.1 4.9 3.7 5.0 4.2  CL 99 104 97* 96* 95*  CO2 25 24 30 26 28   GLUCOSE 140* 110* 117* 123* 154*  BUN 45* 51* 24* 46*  25*  CREATININE 9.98* 11.14* 6.04* 9.25* 5.08*  CALCIUM 9.8 9.2 9.5 9.6 9.8  PHOS  --   --  4.3 7.5* 6.1*   GFR: Estimated Creatinine Clearance: 10.7 mL/min (A) (by C-G formula based on SCr of 5.08 mg/dL (H)).   Liver Function Tests: Recent Labs  Lab 04/09/19 0558 04/10/19 0437 04/11/19 0937 04/12/19 0348  AST 13*  --   --   --   ALT 22  --   --   --   ALKPHOS 60  --   --   --   BILITOT 0.7  --   --   --   PROT 6.1*  --   --   --   ALBUMIN 3.2* 3.6 3.7 4.0   Coagulation Profile: Recent Labs  Lab 04/08/19 1601 04/10/19 0437 04/11/19 0434 04/12/19 0348  INR 1.8* 1.7* 1.7* 1.7*   Urine analysis:    Component Value Date/Time   COLORURINE YELLOW 03/03/2012 1500   APPEARANCEUR CLEAR 03/03/2012 1500   LABSPEC 1.020 03/03/2012 1500   PHURINE 6.0 03/03/2012 1500   GLUCOSEU 100 (A) 03/03/2012 1500   HGBUR TRACE (A) 03/03/2012 1500   BILIRUBINUR NEGATIVE 03/03/2012 1500   KETONESUR NEGATIVE 03/03/2012 1500   PROTEINUR 100 (A) 03/03/2012 1500   UROBILINOGEN 0.2 03/03/2012 1500   NITRITE NEGATIVE 03/03/2012 1500   LEUKOCYTESUR NEGATIVE 03/03/2012 1500    Recent Results (from the past 240 hour(s))  SARS Coronavirus 2 [facility] order, Performed in [facility] hospital lab) Nasopharyngeal Nasopharyngeal Swab     Status: None   Collection Time: 04/08/19  5:50 PM   Specimen: Nasopharyngeal Swab  Result Value Ref Range Status   SARS Coronavirus 2 NEGATIVE NEGATIVE Final    Comment: (NOTE) If result is NEGATIVE SARS-CoV-2 target nucleic acids are NOT DETECTED. The SARS-CoV-2 RNA is generally detectable in upper and lower  respiratory specimens during the acute phase of infection. The lowest  concentration of SARS-CoV-2 viral copies this assay can detect is 250  copies / mL. A negative result does not preclude SARS-CoV-2 infection  and should not be used as the sole basis for treatment or other  patient management decisions.  A negative result may occur with  improper  specimen collection / handling, submission of specimen other  than nasopharyngeal swab, presence of viral mutation(s) within the  areas targeted by this assay, and inadequate number of viral copies  (<250 copies / mL). A negative result must be combined with clinical  observations, patient history, and epidemiological information. If result is POSITIVE SARS-CoV-2 target nucleic acids are DETECTED. The SARS-CoV-2 RNA is generally detectable in upper  and lower  respiratory specimens dur ing the acute phase of infection.  Positive  results are indicative of active infection with SARS-CoV-2.  Clinical  correlation with patient history and other diagnostic information is  necessary to determine patient infection status.  Positive results do  not rule out bacterial infection or co-infection with other viruses. If result is PRESUMPTIVE POSTIVE SARS-CoV-2 nucleic acids MAY BE PRESENT.   A presumptive positive result was obtained on the submitted specimen  and confirmed on repeat testing.  While 2019 novel coronavirus  (SARS-CoV-2) nucleic acids may be present in the submitted sample  additional confirmatory testing may be necessary for epidemiological  and / or clinical management purposes  to differentiate between  SARS-CoV-2 and other Sarbecovirus currently known to infect humans.  If clinically indicated additional testing with an alternate test  methodology [phone]) is advised. The SARS-CoV-2 RNA is generally  detectable in upper and lower respiratory sp ecimens during the acute  phase of infection. The expected result is Negative. Fact Sheet for Patients:  StrictlyIdeas.no Fact Sheet for Healthcare Providers: BankingDealers.co.za This test is not yet approved or cleared by the [place] FDA and has been authorized for detection and/or diagnosis of SARS-CoV-2 by FDA under an Emergency Use Authorization (EUA).  This EUA will remain in  effect (meaning this test can be used) for the duration of the COVID-19 declaration under Section 564(b)(1) of the Act, 21 U.S.C. section 360bbb-3(b)(1), unless the authorization is terminated or revoked sooner. Performed at [facility], [address] [place], [place] [address]   Culture, blood (routine x 2)     Status: None (Preliminary result)   Collection Time: 04/08/19  6:21 PM   Specimen: BLOOD  Result Value Ref Range Status   Specimen Description BLOOD RIGHT ANTECUBITAL  Final   Special Requests   Final    BOTTLES DRAWN AEROBIC AND ANAEROBIC Blood Culture adequate volume   Culture   Final    NO GROWTH 4 DAYS Performed at [facility], [address], [place], [place] [address]    Report Status PENDING  Incomplete  Culture, blood (routine x 2)     Status: None (Preliminary result)   Collection Time: 04/08/19  6:22 PM   Specimen: BLOOD  Result Value Ref Range Status   Specimen Description BLOOD RIGHT ANTECUBITAL  Final   Special Requests   Final    BOTTLES DRAWN AEROBIC AND ANAEROBIC Blood Culture adequate volume   Culture   Final    NO GROWTH 4 DAYS Performed at [facility], [address] [place], [place] [address]    Report Status PENDING  Incomplete  MRSA PCR Screening     Status: None   Collection Time: 04/09/19  9:45 PM   Specimen: Nasopharyngeal  Result Value Ref Range Status   MRSA by PCR NEGATIVE NEGATIVE Final    Comment:        The GeneXpert MRSA Assay (FDA approved for NASAL specimens only), is one component of a comprehensive MRSA colonization surveillance program. It is not intended to diagnose MRSA infection nor to guide or monitor treatment for MRSA infections. Performed at [facility], [address] [place], [place] [address]     Radiology Studies: Ct Head Wo Contrast  Result Date: 04/11/2019 CLINICAL DATA:  Transient ischemic attack.  Slurred speech. EXAM: CT HEAD WITHOUT CONTRAST TECHNIQUE: Contiguous axial images were obtained from the  base of the skull through the vertex without intravenous contrast. COMPARISON:  CT scan of September 05, 2014. FINDINGS: Brain: No evidence of acute infarction, hemorrhage, hydrocephalus, extra-axial collection or mass lesion/mass effect. Vascular: No hyperdense vessel or unexpected calcification. Skull: Normal. Negative for fracture or focal lesion. Sinuses/Orbits: No acute finding. Other: None. IMPRESSION: No acute intracranial abnormality seen. Electronically Signed   By: Marijo Conception M.D.   On: 04/11/2019 12:08   Dg Chest Port 1 View  Result Date: 04/11/2019 CLINICAL DATA:  Increasing shortness of breath following dialysis. EXAM: PORTABLE CHEST 1 VIEW COMPARISON:  04/10/2019. FINDINGS: Poor inspiration. Progressive enlargement of the cardiac silhouette with increased prominence of the pulmonary vasculature interstitial markings. Progressive bilateral airspace opacities, greater on the right. No definite pleural fluid seen. Unremarkable bones. IMPRESSION: 1. Progressive cardiomegaly and changes of congestive heart failure. 2. Progressive bilateral alveolar edema or pneumonia, greater on the right. Electronically Signed   By: Claudie Revering M.D.   On: 04/11/2019 19:54    Scheduled Meds: . budesonide (PULMICORT) nebulizer solution  0.5 mg Nebulization BID  . Chlorhexidine Gluconate Cloth  6 each Topical Daily  . cholecalciferol  2,000 Units Oral QPM  . doxercalciferol  8 mcg Intravenous Q M,W,F-HD  . epoetin (EPOGEN/PROCRIT) injection  2,800 Units Intravenous Q M,W,F-HD  . gabapentin  100 mg Oral BID  . ipratropium-albuterol  3 mL Nebulization Q6H  . methylPREDNISolone (SOLU-MEDROL) injection  40 mg Intravenous Q12H  . metoprolol tartrate  50 mg Oral Once per day on Sun Tue Thu Sat  . pantoprazole  40 mg Oral Daily  . sevelamer carbonate  2,400 mg Oral TID WC  . warfarin  12 mg Oral ONCE-1800  . Warfarin - Pharmacist Dosing Inpatient   Does not apply q1800   Continuous Infusions: . sodium  chloride 50 mL/hr at 04/12/19 0500  . sodium chloride    . sodium chloride    . ceFEPime (MAXIPIME) IV Stopped (04/11/19 1929)     LOS: 2 days    Time spent: 30 minutes    Barton Dubois, MD [facility] Pager [phone]   04/12/2019, 11:20 AM

## 2019-04-13 ENCOUNTER — Inpatient Hospital Stay (HOSPITAL_COMMUNITY): Payer: Medicare HMO

## 2019-04-13 LAB — RENAL FUNCTION PANEL
Albumin: 3.8 g/dL (ref 3.5–5.0)
Anion gap: 14 (ref 5–15)
BUN: 52 mg/dL — ABNORMAL HIGH (ref 8–23)
CO2: 25 mmol/L (ref 22–32)
Calcium: 10.3 mg/dL (ref 8.9–10.3)
Chloride: 97 mmol/L — ABNORMAL LOW (ref 98–111)
Creatinine, Ser: 7.28 mg/dL — ABNORMAL HIGH (ref 0.44–1.00)
GFR calc Af Amer: 6 mL/min — ABNORMAL LOW (ref >60)
GFR calc non Af Amer: 5 mL/min — ABNORMAL LOW (ref >60)
Glucose, Bld: 165 mg/dL — ABNORMAL HIGH (ref 70–99)
Phosphorus: 5.5 mg/dL — ABNORMAL HIGH (ref 2.5–4.6)
Potassium: 4.6 mmol/L (ref 3.5–5.1)
Sodium: 136 mmol/L (ref 135–145)

## 2019-04-13 LAB — EXPECTORATED SPUTUM ASSESSMENT W GRAM STAIN, RFLX TO RESP C

## 2019-04-13 LAB — CULTURE, BLOOD (ROUTINE X 2)
Culture: NO GROWTH
Culture: NO GROWTH
Special Requests: ADEQUATE
Special Requests: ADEQUATE

## 2019-04-13 LAB — PROTIME-INR
INR: 2.1 — ABNORMAL HIGH (ref 0.8–1.2)
Prothrombin Time: 23.5 seconds — ABNORMAL HIGH (ref 11.4–15.2)

## 2019-04-13 MED ORDER — WARFARIN SODIUM 7.5 MG PO TABS
7.5000 mg | ORAL_TABLET | Freq: Once | ORAL | Status: AC
  Administered 2019-04-13: 7.5 mg via ORAL
  Filled 2019-04-13: qty 1

## 2019-04-13 MED ORDER — MIDODRINE HCL 5 MG PO TABS
15.0000 mg | ORAL_TABLET | ORAL | Status: DC | PRN
  Administered 2019-04-13 – 2019-04-15 (×2): 15 mg via ORAL
  Filled 2019-04-13 (×2): qty 3

## 2019-04-13 MED ORDER — METOPROLOL TARTRATE 5 MG/5ML IV SOLN
5.0000 mg | Freq: Four times a day (QID) | INTRAVENOUS | Status: DC | PRN
  Administered 2019-04-13 – 2019-04-15 (×6): 5 mg via INTRAVENOUS
  Filled 2019-04-13 (×5): qty 5

## 2019-04-13 MED ORDER — AMOXICILLIN-POT CLAVULANATE 500-125 MG PO TABS
1.0000 | ORAL_TABLET | Freq: Two times a day (BID) | ORAL | Status: DC
  Administered 2019-04-13 – 2019-04-14 (×4): 500 mg via ORAL
  Filled 2019-04-13 (×4): qty 1

## 2019-04-13 NOTE — Progress Notes (Signed)
PROGRESS NOTE    Sabrina Beck  BWL:[id number] DOB: January 14, 1941 DOA: 04/08/2019 PCP: Redmond School, MD     Brief Narrative:  78 y.o. 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  Patient had been getting progressively short of breath over 5 days.  She reports that it felt like she just could not take a deep breath in.  Patient denies any chest pain, coughing, fever, dizziness.  Patient has been admitted for pneumonia before in January 2020.  This episode feels like that episode only not as bad.  Patient reports no sick contacts.  Covid test negative.  ED course Chemistry shows a glucose of 140, BUN of 45, creatinine of 9.98, and GFR of 3.  Patient has a BNP of 344.  Initial troponin is 20.  Hematology reveals no leukocytosis, but a macrocytic anemia with a hemoglobin of 11.  Chest x-ray was done that shows enlargement of cardiac silhouette.  Right lower lobe consolidation consistent with pneumonia.  Blood cultures drawn.  Lactic acid 1.6.  Troponin stable at 22.  Second lactic acid 1.3.   Assessment & Plan: 1-shortness of breath and hypoxia: In the setting of healthcare associated pneumonia and presumed acute on chronic diastolic heart failure. -Continue antibiotics, but will switch to oral regimen at this point.  -Appreciate nephrology service and assistance; will follow recommendations for next HD treatment.  -Plan is for hemodialysis again today. -Repeated chest x-ray at with positive interstitial edema and vascular congestion. -Continue DuoNeb, Pulmicort and low-dose steroids -Continue follow-up around Mucinex -Continue weaning of oxygen supplementation as tolerated. -Patient in need of further volume status management with hemodialysis.   2-end-stage renal disease -Continue hemodialysis  -Follow nephrology recommendations.  3-chronic atrial fibrillation -Rate slightly off currently, as her metoprolol has been held in anticipation for HD -contninue monitoring on telemetry -continue warfarin for secondary prevention -PRN IV metoprolol will be added as needed in case she develops sustained high rate A. fib.  4-secondary hyperparathyroidism -Continue Renvela  5-anemia of chronic disease -Associated with chronic renal failure -Follow recommendations by nephrology for IV iron and Epogen therapy. -Normal B12 level.  6-morbid obesity -Low calorie diet, portion control and increase physical activity discussed with patient -Body mass index is 39.43 kg/m.  7-gastroesophageal reflux disease -Continue PPI.  8-hypertension -Stable and well-controlled; even patient has been experiencing soft blood pressures during hemodialysis treatment preventing volume extraction. -Continue metoprolol -Follow heart healthy diet.   DVT prophylaxis: Chronically on warfarin Code Status: Full code Family Communication: Son at bedside Disposition Plan: Remains inpatient, continue antibiotics, but will switch to oral regimen.  Continue to follow nephrology service recommendations for further assistance with hemodialysis for volume control.   Consultants:   Nephrology service  Procedures:   See below for x-ray reports  Antimicrobials:  Anti-infectives (From admission, onward)   Start     Dose/Rate Route Frequency Ordered Stop   04/13/19 1000  amoxicillin-clavulanate (AUGMENTIN) 500-125 MG per tablet 500 mg     1 tablet Oral 2 times daily 04/13/19 0741     04/11/19 1600  vancomycin (VANCOCIN) IVPB 1000 mg/200 mL premix  Status:  Discontinued     1,000 mg 200 mL/hr over 60 Minutes Intravenous Once per day on Mon Wed Fri 04/09/19 1755 04/10/19 1003   04/10/19 1800  ceFEPIme (MAXIPIME) 500 mg in dextrose 5 % 50 mL IVPB  Status:  Discontinued     500 mg 100 mL/hr over 30 Minutes  Intravenous Every 24 hours 04/09/19 1749 04/09/19 1750   04/09/19 1900  azithromycin (ZITHROMAX) 500 mg in sodium chloride 0.9 % 250 mL IVPB  Status:  Discontinued     500 mg 250 mL/hr over 60 Minutes Intravenous Every 24 hours 04/08/19 2300 04/11/19 0955   04/09/19 1800  cefTRIAXone (ROCEPHIN) 2 g in sodium chloride 0.9 % 100 mL IVPB  Status:  Discontinued     2 g 200 mL/hr over 30 Minutes Intravenous Every 24 hours 04/08/19 2300 04/09/19 1725   04/09/19 1800  ceFEPIme (MAXIPIME) 1 g in sodium chloride 0.9 % 100 mL IVPB  Status:  Discontinued     1 g 200 mL/hr over 30 Minutes Intravenous  Once 04/09/19 1725 04/09/19 1750   04/09/19 1800  vancomycin (VANCOCIN) IVPB 1000 mg/200 mL premix     1,000 mg 200 mL/hr over 60 Minutes Intravenous Every 1 hr x 2 04/09/19 1745 04/09/19 2000   04/09/19 1800  ceFEPIme (MAXIPIME) 1 g in sodium chloride 0.9 % 100 mL IVPB  Status:  Discontinued     1 g 200 mL/hr over 30 Minutes Intravenous Every 24 hours 04/09/19 1751 04/13/19 0741   04/08/19 1800  cefTRIAXone (ROCEPHIN) 1 g in sodium chloride 0.9 % 100 mL IVPB     1 g 200 mL/hr over 30 Minutes Intravenous  Once 04/08/19 1750 04/08/19 2035   04/08/19 1800  azithromycin (ZITHROMAX) 500 mg in sodium chloride 0.9 % 250 mL IVPB     500 mg 250 mL/hr over 60 Minutes Intravenous  Once 04/08/19 1750 04/08/19 2035      Subjective: No fever, no chest pain, no nausea, no vomiting.  Still experiencing shortness of breath with exertion, also with tachypnea and dyspnea requiring 4 L nasal cannula supplementation.  Objective: Vitals:   04/13/19 1400 04/13/19 1415 04/13/19 1430 04/13/19 1445  BP: 123/70 (!) 141/80 121/78 123/74  Pulse: (!) 119 (!) 116 (!) 134 (!) 130  Resp: 18 (!) 26 (!) 22 (!) 22  Temp:      TempSrc:      SpO2:      Weight:      Height:        Intake/Output Summary (Last 24 hours) at 04/13/2019 1535 Last data filed at 04/13/2019 1430 Gross per 24 hour  Intake 1297.25 ml  Output 3000 ml   Net -1702.75 ml   Filed Weights   04/11/19 1829 04/13/19 0500 04/13/19 1045  Weight: 106.1 kg 105.4 kg 104.2 kg    Examination: General exam: Alert, awake, oriented x 3; no focal neurologic deficit appreciated; patient reports feeling slightly confused otherwise in no distress.  Dyspneic and short of breath on examination, no fever, no chest pain.  Requiring 4 L nasal cannula supplementation.  No nausea, no vomiting. Respiratory system: Positive rhonchi right, fine crackles at the bases, no using accessory muscles.  Tachypneic with minimal exertion. Cardiovascular system: Irregular, no rubs, no gallops; unable to properly assess JVD due to body habitus. Gastrointestinal system: Abdomen is obese, nondistended, soft and nontender. No organomegaly or masses felt. Normal bowel sounds heard. Central nervous system: Alert and oriented. No focal neurological deficits. Extremities: No cyanosis or clubbing; trace edema appreciated bilaterally. Skin: No rashes, no petechiae; left forearm with AV fistula in place with good bruit. Psychiatry: Judgement and insight appear normal. Mood & affect appropriate.    Data Reviewed: I have personally reviewed following labs  and imaging studies  CBC: Recent Labs  Lab 04/08/19 1600 04/09/19 0558 04/10/19 0437 04/11/19 0937  WBC 8.0 7.1 6.5 6.8  NEUTROABS  --  4.8  --   --   HGB 11.0* 9.8* 8.9* 9.3*  HCT 33.9* 30.4*  28.8* 27.9* 28.8*  MCV 110.1* 110.9* 111.2* 109.9*  PLT 146* 141* 130* 854   Basic Metabolic Panel: Recent Labs  Lab 04/09/19 0558 04/10/19 0437 04/11/19 0937 04/12/19 0348 04/13/19 0410  NA 139 137 135 136 136  K 4.9 3.7 5.0 4.2 4.6  CL 104 97* 96* 95* 97*  CO2 24 30 26 28 25   GLUCOSE 110* 117* 123* 154* 165*  BUN 51* 24* 46* 25* 52*  CREATININE 11.14* 6.04* 9.25* 5.08* 7.28*  CALCIUM 9.2 9.5 9.6 9.8 10.3  PHOS  --  4.3 7.5* 6.1* 5.5*   GFR: Estimated Creatinine Clearance: 7.4 mL/min (A) (by C-G formula based on SCr of  7.28 mg/dL (H)).   Liver Function Tests: Recent Labs  Lab 04/09/19 0558 04/10/19 0437 04/11/19 0937 04/12/19 0348 04/13/19 0410  AST 13*  --   --   --   --   ALT 22  --   --   --   --   ALKPHOS 60  --   --   --   --   BILITOT 0.7  --   --   --   --   PROT 6.1*  --   --   --   --   ALBUMIN 3.2* 3.6 3.7 4.0 3.8   Coagulation Profile: Recent Labs  Lab 04/08/19 1601 04/10/19 0437 04/11/19 0434 04/12/19 0348 04/13/19 0409  INR 1.8* 1.7* 1.7* 1.7* 2.1*   Urine analysis:    Component Value Date/Time   COLORURINE YELLOW 03/03/2012 1500   APPEARANCEUR CLEAR 03/03/2012 1500   LABSPEC 1.020 03/03/2012 1500   PHURINE 6.0 03/03/2012 1500   GLUCOSEU 100 (A) 03/03/2012 1500   HGBUR TRACE (A) 03/03/2012 1500   BILIRUBINUR NEGATIVE 03/03/2012 1500   KETONESUR NEGATIVE 03/03/2012 1500   PROTEINUR 100 (A) 03/03/2012 1500   UROBILINOGEN 0.2 03/03/2012 1500   NITRITE NEGATIVE 03/03/2012 1500   LEUKOCYTESUR NEGATIVE 03/03/2012 1500    Recent Results (from the past 240 hour(s))  SARS Coronavirus 2 [facility] order, Performed in [facility] hospital lab) Nasopharyngeal Nasopharyngeal Swab     Status: None   Collection Time: 04/08/19  5:50 PM   Specimen: Nasopharyngeal Swab  Result Value Ref Range Status   SARS Coronavirus 2 NEGATIVE NEGATIVE Final    Comment: (NOTE) If result is NEGATIVE SARS-CoV-2 target nucleic acids are NOT DETECTED. The SARS-CoV-2 RNA is generally detectable in upper and lower  respiratory specimens during the acute phase of infection. The lowest  concentration of SARS-CoV-2 viral copies this assay can detect is 250  copies / mL. A negative result does not preclude SARS-CoV-2 infection  and should not be used as the sole basis for treatment or other  patient management decisions.  A negative result may occur with  improper specimen collection / handling, submission of specimen other  than nasopharyngeal swab, presence of viral mutation(s) within the  areas  targeted by this assay, and inadequate number of viral copies  (<250 copies / mL). A negative result must be combined with clinical  observations, patient history, and epidemiological information. If result is POSITIVE SARS-CoV-2 target nucleic acids are DETECTED. The SARS-CoV-2 RNA is generally detectable in upper and lower  respiratory specimens dur ing the acute  phase of infection.  Positive  results are indicative of active infection with SARS-CoV-2.  Clinical  correlation with patient history and other diagnostic information is  necessary to determine patient infection status.  Positive results do  not rule out bacterial infection or co-infection with other viruses. If result is PRESUMPTIVE POSTIVE SARS-CoV-2 nucleic acids MAY BE PRESENT.   A presumptive positive result was obtained on the submitted specimen  and confirmed on repeat testing.  While 2019 novel coronavirus  (SARS-CoV-2) nucleic acids may be present in the submitted sample  additional confirmatory testing may be necessary for epidemiological  and / or clinical management purposes  to differentiate between  SARS-CoV-2 and other Sarbecovirus currently known to infect humans.  If clinically indicated additional testing with an alternate test  methodology [phone]) is advised. The SARS-CoV-2 RNA is generally  detectable in upper and lower respiratory sp ecimens during the acute  phase of infection. The expected result is Negative. Fact Sheet for Patients:  StrictlyIdeas.no Fact Sheet for Healthcare Providers: BankingDealers.co.za This test is not yet approved or cleared by the [place] FDA and has been authorized for detection and/or diagnosis of SARS-CoV-2 by FDA under an Emergency Use Authorization (EUA).  This EUA will remain in effect (meaning this test can be used) for the duration of the COVID-19 declaration under Section 564(b)(1) of the Act, 21 U.S.C. section  360bbb-3(b)(1), unless the authorization is terminated or revoked sooner. Performed at [facility], [address], [place], [place] [address]   Culture, blood (routine x 2)     Status: None   Collection Time: 04/08/19  6:21 PM   Specimen: BLOOD  Result Value Ref Range Status   Specimen Description BLOOD RIGHT ANTECUBITAL  Final   Special Requests   Final    BOTTLES DRAWN AEROBIC AND ANAEROBIC Blood Culture adequate volume   Culture   Final    NO GROWTH 5 DAYS Performed at [facility], [address] [place], [place] [address]    Report Status 04/13/2019 FINAL  Final  Culture, blood (routine x 2)     Status: None   Collection Time: 04/08/19  6:22 PM   Specimen: BLOOD  Result Value Ref Range Status   Specimen Description BLOOD RIGHT ANTECUBITAL  Final   Special Requests   Final    BOTTLES DRAWN AEROBIC AND ANAEROBIC Blood Culture adequate volume   Culture   Final    NO GROWTH 5 DAYS Performed at [facility], [address], [place], [place] [address]    Report Status 04/13/2019 FINAL  Final  MRSA PCR Screening     Status: None   Collection Time: 04/09/19  9:45 PM   Specimen: Nasopharyngeal  Result Value Ref Range Status   MRSA by PCR NEGATIVE NEGATIVE Final    Comment:        The GeneXpert MRSA Assay (FDA approved for NASAL specimens only), is one component of a comprehensive MRSA colonization surveillance program. It is not intended to diagnose MRSA infection nor to guide or monitor treatment for MRSA infections. Performed at [facility], [address], [place],  [address]   Culture, sputum-assessment     Status: None   Collection Time: 04/13/19  9:30 AM   Specimen: Sputum  Result Value Ref Range Status   Specimen Description SPUTUM  Final   Special Requests NONE  Final   Sputum evaluation   Final    Sputum specimen not acceptable for testing.  Please recollect.   NOTIFIED L.  HILTON,RN @1014   09/06/2020KAY  SPECIMEN TO BE RECOLLECTED Performed at [facility], [address], [place], [place] [address]    Report Status 04/13/2019 FINAL  Final    Radiology Studies: Dg Chest Port 1 View  Result Date: 04/13/2019 CLINICAL DATA:  Shortness of breath and hypoxia in a patient with a history of end-stage renal disease. EXAM: PORTABLE CHEST 1 VIEW COMPARISON:  Single-view of the chest 04/11/2019. PA and lateral chest 04/10/2019. FINDINGS: Bilateral airspace disease has improved but there is persistent focal airspace opacity in the right mid and lower lung zones. There is cardiomegaly. No pneumothorax. Small bilateral pleural effusions. IMPRESSION: Persistent but improved bilateral airspace disease compatible with decreased pulmonary edema. More focal airspace opacity in the right midlung could be due to edema or pneumonia. Electronically Signed   By: Inge Rise M.D.   On: 04/13/2019 09:08   Dg Chest Port 1 View  Result Date: 04/11/2019 CLINICAL DATA:  Increasing shortness of breath following dialysis. EXAM: PORTABLE CHEST 1 VIEW COMPARISON:  04/10/2019. FINDINGS: Poor inspiration. Progressive enlargement of the cardiac silhouette with increased prominence of the pulmonary vasculature interstitial markings. Progressive bilateral airspace opacities, greater on the right. No definite pleural fluid seen. Unremarkable bones. IMPRESSION: 1. Progressive cardiomegaly and changes of congestive heart failure. 2. Progressive bilateral alveolar edema or pneumonia, greater on the right. Electronically Signed   By: Claudie Revering M.D.   On: 04/11/2019 19:54    Scheduled Meds: . amoxicillin-clavulanate  1 tablet Oral BID  . budesonide (PULMICORT) nebulizer solution  0.5 mg Nebulization BID  . Chlorhexidine Gluconate Cloth  6 each Topical Daily  . Chlorhexidine Gluconate Cloth  6 each Topical Q0600  . cholecalciferol  2,000 Units Oral QPM  . doxercalciferol  8 mcg Intravenous Q M,W,F-HD  . epoetin (EPOGEN/PROCRIT) injection  2,800 Units Intravenous Q M,W,F-HD  .  gabapentin  100 mg Oral BID  . ipratropium-albuterol  3 mL Nebulization TID  . mouth rinse  15 mL Mouth Rinse BID  . methylPREDNISolone (SOLU-MEDROL) injection  40 mg Intravenous Q12H  . metoprolol tartrate  50 mg Oral Once per day on Sun Tue Thu Sat  . pantoprazole  40 mg Oral Daily  . sevelamer carbonate  2,400 mg Oral TID WC  . warfarin  7.5 mg Oral ONCE-1800  . Warfarin - Pharmacist Dosing Inpatient   Does not apply q1800   Continuous Infusions: . sodium chloride 50 mL/hr at 04/13/19 0510  . sodium chloride    . sodium chloride       LOS: 3 days    Time spent: 30 minutes   Barton Dubois, MD [facility] Pager [phone]   04/13/2019, 3:35 PM

## 2019-04-13 NOTE — Procedures (Signed)
    HEMODIALYSIS TREATMENT NOTE:  Extra 3.5 hour HD treatment for volume overload.  Midodrine 15mg  given prior to HD.  Dialysate cooled to 36 degrees.  SVS 148 linear.  Able to remove 3 liters today.  HR 120-130s but metoprolol held prior to HD.  All blood was returned and hemostasis was achieved in 15 minutes.  Rockwell Alexandria, RN

## 2019-04-13 NOTE — Progress Notes (Signed)
ANTICOAGULATION CONSULT NOTE   Pharmacy Consult for Coumadin Indication: atrial fibrillation  No Known Allergies  Patient Measurements: Height: 5\' 4"  (162.6 cm) Weight: 232 lb 5.8 oz (105.4 kg) IBW/kg (Calculated) : 54.7  Vital Signs: Temp: 98.5 F (36.9 C) (09/06 0400) Temp Source: Oral (09/06 0400) BP: 137/87 (09/06 0700) Pulse Rate: 125 (09/06 0700)  Labs: Recent Labs    04/11/19 0434 04/11/19 0937 04/12/19 0348 04/13/19 0409 04/13/19 0410  HGB  --  9.3*  --   --   --   HCT  --  28.8*  --   --   --   PLT  --  154  --   --   --   LABPROT 19.6*  --  20.1* 23.5*  --   INR 1.7*  --  1.7* 2.1*  --   CREATININE  --  9.25* 5.08*  --  7.28*    Estimated Creatinine Clearance: 7.4 mL/min (A) (by C-G formula based on SCr of 7.28 mg/dL (H)).   Medical History: Past Medical History:  Diagnosis Date  . Anemia   . Atrial fibrillation ([place])   . Chest pain 05/15/2006   Stress test negative for ischemia  . Diastolic heart failure ([place])   . Diverticulosis   . ESRD on hemodialysis ([place])   . GERD (gastroesophageal reflux disease)   . Headache   . Mixed hyperlipidemia   . Peripheral neuropathy   . PONV (postoperative nausea and vomiting)   . Systemic hypertension   . Uterine cancer ([facility])     Medications:  Medications Prior to Admission  Medication Sig Dispense Refill Last Dose  . acetaminophen (TYLENOL) 500 MG tablet Take 1,000 mg by mouth every 6 (six) hours as needed for moderate pain.    Past Week at Unknown time  . Cholecalciferol (VITAMIN D) 2000 UNITS CAPS Take 2,000 Units by mouth every evening.    04/07/2019 at Unknown time  . gabapentin (NEURONTIN) 100 MG capsule Take 100 mg by mouth 2 (two) times daily.    04/08/2019 at Unknown time  . metoprolol tartrate (LOPRESSOR) 50 MG tablet Take 1 tablet (50 mg total) by mouth 2 (two) times daily. (Patient taking differently: Take 50 mg by mouth See admin instructions. Take one tablet every day at bedtime. Take one tablet  every day in the morning on days that are non-dialysis days (T-TH-S-S)) 60 tablet 6 04/08/2019 at [place]  . Multiple Vitamins-Minerals (PRESERVISION AREDS 2 PO) Take 1 tablet by mouth 2 (two) times daily.   04/08/2019 at Unknown time  . multivitamin (RENA-VIT) TABS tablet Take 1 tablet by mouth every evening.    04/07/2019 at Unknown time  . Omega-3 Fatty Acids (OMEGA-3 FISH OIL) 300 MG CAPS Take 300 mg by mouth 2 (two) times daily.   04/08/2019 at Unknown time  . omeprazole (PRILOSEC) 20 MG capsule Take 20 mg by mouth every morning.    04/08/2019 at Unknown time  . Propylene Glycol (SYSTANE BALANCE) 0.6 % SOLN Place 1 drop into both eyes 3 (three) times daily as needed (for burning or dry eyes).    Past Month at Unknown time  . RENVELA 800 MG tablet Take 2,400 mg by mouth 3 (three) times daily with meals. Takes 1 tablet with snacks   04/08/2019 at Unknown time  . warfarin (COUMADIN) 5 MG tablet Take 1.5 to 2 tablets by mouth daily as directed by coumadin clinic (Patient taking differently: Take 7.5-10 mg by mouth See admin instructions. 10mg  (10mg  total) on  Mondays, then take 7.5mg  (1.5 tablets) on all other days in the evening.) 150 tablet 1 04/07/2019 at 2000    Assessment: 78 y.o.female,with history of uterine cancer, hypertension, peripheral neuropathy, hyperlipidemia, GERD, ESRD on dialysis M-W-F, diastolic heart failure, atrial fibrillation, and anemia presents to the ED with shortness of breath. Patient had been getting progressively short of breath over 5 days. Patient chronically anticoagulated with coumadin. Pharmacy asked to monitor and dose coumadin. INR has been hovering around 1.8 to 1.7, gave slight booster dose yesterday, now 2.1.  Home dose is 10mg  on Monday and 7.5mg  all other days.  Goal of Therapy:  INR 2-3 Monitor platelets by anticoagulation protocol: Yes  Plan:  Coumadin 7.5mg  po x 1 today Daily PT-INR Monitor for s/s of bleeding  Thomasenia Sales, PharmD, MBA, BCGP Clinical  Pharmacist  04/13/2019 7:33 AM

## 2019-04-14 LAB — RENAL FUNCTION PANEL
Albumin: 3.8 g/dL (ref 3.5–5.0)
Anion gap: 15 (ref 5–15)
BUN: 44 mg/dL — ABNORMAL HIGH (ref 8–23)
CO2: 25 mmol/L (ref 22–32)
Calcium: 10.6 mg/dL — ABNORMAL HIGH (ref 8.9–10.3)
Chloride: 100 mmol/L (ref 98–111)
Creatinine, Ser: 5.67 mg/dL — ABNORMAL HIGH (ref 0.44–1.00)
GFR calc Af Amer: 8 mL/min — ABNORMAL LOW (ref >60)
GFR calc non Af Amer: 7 mL/min — ABNORMAL LOW (ref >60)
Glucose, Bld: 128 mg/dL — ABNORMAL HIGH (ref 70–99)
Phosphorus: 5.2 mg/dL — ABNORMAL HIGH (ref 2.5–4.6)
Potassium: 4.1 mmol/L (ref 3.5–5.1)
Sodium: 140 mmol/L (ref 135–145)

## 2019-04-14 LAB — CBC
HCT: 31.6 % — ABNORMAL LOW (ref 36.0–46.0)
Hemoglobin: 10.2 g/dL — ABNORMAL LOW (ref 12.0–15.0)
MCH: 35.4 pg — ABNORMAL HIGH (ref 26.0–34.0)
MCHC: 32.3 g/dL (ref 30.0–36.0)
MCV: 109.7 fL — ABNORMAL HIGH (ref 80.0–100.0)
Platelets: 266 10*3/uL (ref 150–400)
RBC: 2.88 MIL/uL — ABNORMAL LOW (ref 3.87–5.11)
RDW: 13.3 % (ref 11.5–15.5)
WBC: 12.1 10*3/uL — ABNORMAL HIGH (ref 4.0–10.5)
nRBC: 0 % (ref 0.0–0.2)

## 2019-04-14 LAB — MRSA PCR SCREENING: MRSA by PCR: NEGATIVE

## 2019-04-14 LAB — PROTIME-INR
INR: 2.6 — ABNORMAL HIGH (ref 0.8–1.2)
Prothrombin Time: 27.1 seconds — ABNORMAL HIGH (ref 11.4–15.2)

## 2019-04-14 MED ORDER — WARFARIN SODIUM 7.5 MG PO TABS
7.5000 mg | ORAL_TABLET | Freq: Once | ORAL | Status: AC
  Administered 2019-04-14: 7.5 mg via ORAL
  Filled 2019-04-14: qty 1

## 2019-04-14 MED ORDER — METOPROLOL TARTRATE 5 MG/5ML IV SOLN
5.0000 mg | INTRAVENOUS | Status: AC
  Administered 2019-04-14: 5 mg via INTRAVENOUS
  Filled 2019-04-14: qty 5

## 2019-04-14 MED ORDER — IPRATROPIUM BROMIDE 0.02 % IN SOLN
0.5000 mg | Freq: Four times a day (QID) | RESPIRATORY_TRACT | Status: DC
  Administered 2019-04-14 – 2019-04-16 (×5): 0.5 mg via RESPIRATORY_TRACT
  Filled 2019-04-14 (×5): qty 2.5

## 2019-04-14 MED ORDER — AMIODARONE IV BOLUS ONLY 150 MG/100ML
150.0000 mg | Freq: Once | INTRAVENOUS | Status: AC
  Administered 2019-04-14: 150 mg via INTRAVENOUS
  Filled 2019-04-14: qty 100

## 2019-04-14 MED ORDER — METOPROLOL TARTRATE 25 MG PO TABS
25.0000 mg | ORAL_TABLET | Freq: Two times a day (BID) | ORAL | Status: DC
  Administered 2019-04-14 – 2019-04-19 (×11): 25 mg via ORAL
  Filled 2019-04-14 (×12): qty 1

## 2019-04-14 MED ORDER — AMIODARONE IV BOLUS ONLY 150 MG/100ML
150.0000 mg | INTRAVENOUS | Status: AC
  Administered 2019-04-14 (×2): 150 mg via INTRAVENOUS
  Filled 2019-04-14: qty 100

## 2019-04-14 MED ORDER — DEXTROSE 5 % IV SOLN: 300.0000 mg | Freq: Once | INTRAVENOUS | Status: DC

## 2019-04-14 NOTE — Progress Notes (Signed)
[facility] for Coumadin Indication: atrial fibrillation  No Known Allergies  Patient Measurements: Height: 5\' 4"  (162.6 cm) Weight: 223 lb 15.8 oz (101.6 kg) IBW/kg (Calculated) : 54.7  Vital Signs: Temp: 98.9 F (37.2 C) (09/07 0721) Temp Source: Oral (09/07 0721) BP: 118/94 (09/07 0913) Pulse Rate: 138 (09/07 0913)  Labs: Recent Labs    04/11/19 9211 04/12/19 0348 04/13/19 0409 04/13/19 0410 04/14/19 0419 04/14/19 0420  HGB 9.3*  --   --   --   --   --   HCT 28.8*  --   --   --   --   --   PLT 154  --   --   --   --   --   LABPROT  --  20.1* 23.5*  --  27.1*  --   INR  --  1.7* 2.1*  --  2.6*  --   CREATININE 9.25* 5.08*  --  7.28*  --  5.67*    Estimated Creatinine Clearance: 9.3 mL/min (A) (by C-G formula based on SCr of 5.67 mg/dL (H)).   Medical History: Past Medical History:  Diagnosis Date  . Anemia   . Atrial fibrillation ([place])   . Chest pain 05/15/2006   Stress test negative for ischemia  . Diastolic heart failure ([place])   . Diverticulosis   . ESRD on hemodialysis ([place])   . GERD (gastroesophageal reflux disease)   . Headache   . Mixed hyperlipidemia   . Peripheral neuropathy   . PONV (postoperative nausea and vomiting)   . Systemic hypertension   . Uterine cancer ([facility])     Medications:  Medications Prior to Admission  Medication Sig Dispense Refill Last Dose  . acetaminophen (TYLENOL) 500 MG tablet Take 1,000 mg by mouth every 6 (six) hours as needed for moderate pain.    Past Week at Unknown time  . Cholecalciferol (VITAMIN D) 2000 UNITS CAPS Take 2,000 Units by mouth every evening.    04/07/2019 at Unknown time  . gabapentin (NEURONTIN) 100 MG capsule Take 100 mg by mouth 2 (two) times daily.    04/08/2019 at Unknown time  . metoprolol tartrate (LOPRESSOR) 50 MG tablet Take 1 tablet (50 mg total) by mouth 2 (two) times daily. (Patient taking differently: Take 50 mg by mouth See admin instructions. Take one  tablet every day at bedtime. Take one tablet every day in the morning on days that are non-dialysis days (T-TH-S-S)) 60 tablet 6 04/08/2019 at [facility]  . Multiple Vitamins-Minerals (PRESERVISION AREDS 2 PO) Take 1 tablet by mouth 2 (two) times daily.   04/08/2019 at Unknown time  . multivitamin (RENA-VIT) TABS tablet Take 1 tablet by mouth every evening.    04/07/2019 at Unknown time  . Omega-3 Fatty Acids (OMEGA-3 FISH OIL) 300 MG CAPS Take 300 mg by mouth 2 (two) times daily.   04/08/2019 at Unknown time  . omeprazole (PRILOSEC) 20 MG capsule Take 20 mg by mouth every morning.    04/08/2019 at Unknown time  . Propylene Glycol (SYSTANE BALANCE) 0.6 % SOLN Place 1 drop into both eyes 3 (three) times daily as needed (for burning or dry eyes).    Past Month at Unknown time  . RENVELA 800 MG tablet Take 2,400 mg by mouth 3 (three) times daily with meals. Takes 1 tablet with snacks   04/08/2019 at Unknown time  . warfarin (COUMADIN) 5 MG tablet Take 1.5 to 2 tablets by mouth daily as directed  by coumadin clinic (Patient taking differently: Take 7.5-10 mg by mouth See admin instructions. 10mg  (10mg  total) on Mondays, then take 7.5mg  (1.5 tablets) on all other days in the evening.) 150 tablet 1 04/07/2019 at 2000    Assessment: 78 y.o.female,with history of uterine cancer, hypertension, peripheral neuropathy, hyperlipidemia, GERD, ESRD on dialysis M-W-F, diastolic heart failure, atrial fibrillation, and anemia presents to the ED with shortness of breath. Patient had been getting progressively short of breath over 5 days. Patient chronically anticoagulated with coumadin. Pharmacy asked to monitor and dose coumadin. INR has been hovering around 1.8 to 1.7, gave slight booster dose yesterday, now 2.6.  Home dose is 10mg  on Monday and 7.5mg  all other days.  Goal of Therapy:  INR 2-3 Monitor platelets by anticoagulation protocol: Yes  Plan:  Coumadin 7.5mg  po x 1 today Daily PT-INR Monitor for s/s of  bleeding  Thomasenia Sales, PharmD, MBA, BCGP Clinical Pharmacist  04/14/2019 9:28 AM

## 2019-04-14 NOTE — Progress Notes (Signed)
PROGRESS NOTE    Sabrina Beck  ZOX:[id number] DOB: 1941/04/27 DOA: 04/08/2019 PCP: Redmond School, MD     Brief Narrative:  78 y.o. 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  Patient had been getting progressively short of breath over 5 days.  She reports that it felt like she just could not take a deep breath in.  Patient denies any chest pain, coughing, fever, dizziness.  Patient has been admitted for pneumonia before in January 2020.  This episode feels like that episode only not as bad.  Patient reports no sick contacts.  Covid test negative.  ED course Chemistry shows a glucose of 140, BUN of 45, creatinine of 9.98, and GFR of 3.  Patient has a BNP of 344.  Initial troponin is 20.  Hematology reveals no leukocytosis, but a macrocytic anemia with a hemoglobin of 11.  Chest x-ray was done that shows enlargement of cardiac silhouette.  Right lower lobe consolidation consistent with pneumonia.  Blood cultures drawn.  Lactic acid 1.6.  Troponin stable at 22.  Second lactic acid 1.3.   Assessment & Plan: 1-shortness of breath and hypoxia: In the setting of healthcare associated pneumonia and presumed acute on chronic diastolic heart failure. -Continue antibiotics, but will switch to oral regimen at this point.  -Appreciate nephrology service and assistance; will follow recommendations for next HD treatment.  -3L off from HD treatment on 04/13/19. Plan is for hemodialysis again today and tomorrow. -Repeated chest x-ray on 9/6 demonstrated positive interstitial edema and vascular congestion. -repeat x-ray in am -Continue atrovent, Pulmicort and low-dose steroids -Continue flutter valve and Mucinex -Continue weaning of oxygen supplementation as tolerated. -Patient in need of further volume status  management with hemodialysis.   2-end-stage renal disease -Continue hemodialysis -Follow further nephrology recommendations. -plan is for HD today and again tomorrow.  3-chronic atrial fibrillation -Uncontrolled -Cardiology service has been consulted -Continue metoprolol and PRN IV metoprolol -amiodarone boluses recommended by cardiology fellow -if needed will start amiodarone drip. -uncontrolled HR most likely triggered by CHF exacerbation.   4-secondary hyperparathyroidism -Continue Renvela  5-anemia of chronic disease -Associated with chronic renal failure -Follow recommendations by nephrology for IV iron and Epogen therapy. -Normal B12 level.  6-morbid obesity -Low calorie diet, portion control and increase physical activity discussed with patient -Body mass index is 38.41 kg/m.  7-gastroesophageal reflux disease -Continue PPI.  8-hypertension -Stable and well-controlled; even patient has been experiencing soft blood pressures during hemodialysis treatment preventing volume extraction. -Continue metoprolol -Follow heart healthy diet.   DVT prophylaxis: Chronically on warfarin Code Status: Full code Family Communication: Son at bedside Disposition Plan: Remains inpatient and in a stepdown unit, continue oral antibiotics and continue volume stabilization with hemodialysis treatment.    Consultants:   Nephrology service  Procedures:   See below for x-ray reports  Antimicrobials:  Anti-infectives (From admission, onward)   Start     Dose/Rate Route Frequency Ordered Stop   04/13/19 1000  amoxicillin-clavulanate (AUGMENTIN) 500-125 MG per tablet 500 mg     1 tablet Oral 2 times daily 04/13/19 0741     04/11/19 1600  vancomycin (VANCOCIN) IVPB 1000 mg/200 mL premix  Status:  Discontinued     1,000 mg 200 mL/hr over 60 Minutes Intravenous Once per day on Mon Wed Fri 04/09/19 1755 04/10/19 1003   04/10/19 1800  ceFEPIme (MAXIPIME)  500 mg in dextrose 5 % 50 mL  IVPB  Status:  Discontinued     500 mg 100 mL/hr over 30 Minutes Intravenous Every 24 hours 04/09/19 1749 04/09/19 1750   04/09/19 1900  azithromycin (ZITHROMAX) 500 mg in sodium chloride 0.9 % 250 mL IVPB  Status:  Discontinued     500 mg 250 mL/hr over 60 Minutes Intravenous Every 24 hours 04/08/19 2300 04/11/19 0955   04/09/19 1800  cefTRIAXone (ROCEPHIN) 2 g in sodium chloride 0.9 % 100 mL IVPB  Status:  Discontinued     2 g 200 mL/hr over 30 Minutes Intravenous Every 24 hours 04/08/19 2300 04/09/19 1725   04/09/19 1800  ceFEPIme (MAXIPIME) 1 g in sodium chloride 0.9 % 100 mL IVPB  Status:  Discontinued     1 g 200 mL/hr over 30 Minutes Intravenous  Once 04/09/19 1725 04/09/19 1750   04/09/19 1800  vancomycin (VANCOCIN) IVPB 1000 mg/200 mL premix     1,000 mg 200 mL/hr over 60 Minutes Intravenous Every 1 hr x 2 04/09/19 1745 04/09/19 2000   04/09/19 1800  ceFEPIme (MAXIPIME) 1 g in sodium chloride 0.9 % 100 mL IVPB  Status:  Discontinued     1 g 200 mL/hr over 30 Minutes Intravenous Every 24 hours 04/09/19 1751 04/13/19 0741   04/08/19 1800  cefTRIAXone (ROCEPHIN) 1 g in sodium chloride 0.9 % 100 mL IVPB     1 g 200 mL/hr over 30 Minutes Intravenous  Once 04/08/19 1750 04/08/19 2035   04/08/19 1800  azithromycin (ZITHROMAX) 500 mg in sodium chloride 0.9 % 250 mL IVPB     500 mg 250 mL/hr over 60 Minutes Intravenous  Once 04/08/19 1750 04/08/19 2035      Subjective: No fever, no chest pain, no nausea, no vomiting.  Still dyspneic and short of breath.  Having difficulty with heart rate control currently.  Objective: Vitals:   04/14/19 1615 04/14/19 1617 04/14/19 1630 04/14/19 1645  BP: 120/73 120/73 123/76 103/78  Pulse: (!) 152 (!) 152 (!) 152 (!) 138  Resp: (!) 26 (!) 24 (!) 27 (!) 30  Temp:  98.3 F (36.8 C)    TempSrc:  Oral    SpO2:  96%    Weight:      Height:        Intake/Output Summary (Last 24 hours) at 04/14/2019 1651 Last data filed at 04/14/2019 1617 Gross per  24 hour  Intake 666.47 ml  Output --  Net 666.47 ml   Filed Weights   04/13/19 1045 04/14/19 0500 04/14/19 1440  Weight: 104.2 kg 101.6 kg 101.5 kg    Examination: General exam: Alert, awake, oriented x 3; still dyspneic and short of breath with minimal exertion.  Reports some palpitations. Respiratory system: Fine crackles at the bases, positive tachypnea; rhonchi are appreciated on exam.  No using accessory muscles.  Patient with good O2 sat on 2 L supplementation. Cardiovascular system: Irregular irregular; no rubs, no gallops. Gastrointestinal system: Abdomen is nondistended, soft and nontender. No organomegaly or masses felt. Normal bowel sounds heard. Central nervous system: Alert and oriented. No focal neurological deficits. Extremities/skin: Trace edema bilaterally, no cyanosis, no clubbing.  There is no rashes, petechiae or open wounds.  Left forearm AV fistula with good bruit appreciated on exam. Psychiatry: Judgement and insight appear normal. Mood & affect appropriate.    Data Reviewed: I have personally reviewed following labs and imaging studies  CBC: Recent Labs  Lab 04/08/19 1600 04/09/19 0558  04/10/19 0437 04/11/19 0937  WBC 8.0 7.1 6.5 6.8  NEUTROABS  --  4.8  --   --   HGB 11.0* 9.8* 8.9* 9.3*  HCT 33.9* 30.4*   28.8* 27.9* 28.8*  MCV 110.1* 110.9* 111.2* 109.9*  PLT 146* 141* 130* 381   Basic Metabolic Panel: Recent Labs  Lab 04/10/19 0437 04/11/19 0937 04/12/19 0348 04/13/19 0410 04/14/19 0420  NA 137 135 136 136 140  K 3.7 5.0 4.2 4.6 4.1  CL 97* 96* 95* 97* 100  CO2 30 26 28 25 25   GLUCOSE 117* 123* 154* 165* 128*  BUN 24* 46* 25* 52* 44*  CREATININE 6.04* 9.25* 5.08* 7.28* 5.67*  CALCIUM 9.5 9.6 9.8 10.3 10.6*  PHOS 4.3 7.5* 6.1* 5.5* 5.2*   GFR: Estimated Creatinine Clearance: 9.3 mL/min (A) (by C-G formula based on SCr of 5.67 mg/dL (H)).   Liver Function Tests: Recent Labs  Lab 04/09/19 0558 04/10/19 0437 04/11/19 0937  04/12/19 0348 04/13/19 0410 04/14/19 0420  AST 13*  --   --   --   --   --   ALT 22  --   --   --   --   --   ALKPHOS 60  --   --   --   --   --   BILITOT 0.7  --   --   --   --   --   PROT 6.1*  --   --   --   --   --   ALBUMIN 3.2* 3.6 3.7 4.0 3.8 3.8   Coagulation Profile: Recent Labs  Lab 04/10/19 0437 04/11/19 0434 04/12/19 0348 04/13/19 0409 04/14/19 0419  INR 1.7* 1.7* 1.7* 2.1* 2.6*   Urine analysis:    Component Value Date/Time   COLORURINE YELLOW 03/03/2012 1500   APPEARANCEUR CLEAR 03/03/2012 1500   LABSPEC 1.020 03/03/2012 1500   PHURINE 6.0 03/03/2012 1500   GLUCOSEU 100 (A) 03/03/2012 1500   HGBUR TRACE (A) 03/03/2012 1500   BILIRUBINUR NEGATIVE 03/03/2012 1500   KETONESUR NEGATIVE 03/03/2012 1500   PROTEINUR 100 (A) 03/03/2012 1500   UROBILINOGEN 0.2 03/03/2012 1500   NITRITE NEGATIVE 03/03/2012 1500   LEUKOCYTESUR NEGATIVE 03/03/2012 1500    Recent Results (from the past 240 hour(s))  SARS Coronavirus 2 [facility] order, Performed in [facility] hospital lab) Nasopharyngeal Nasopharyngeal Swab     Status: None   Collection Time: 04/08/19  5:50 PM   Specimen: Nasopharyngeal Swab  Result Value Ref Range Status   SARS Coronavirus 2 NEGATIVE NEGATIVE Final    Comment: (NOTE) If result is NEGATIVE SARS-CoV-2 target nucleic acids are NOT DETECTED. The SARS-CoV-2 RNA is generally detectable in upper and lower  respiratory specimens during the acute phase of infection. The lowest  concentration of SARS-CoV-2 viral copies this assay can detect is 250  copies / mL. A negative result does not preclude SARS-CoV-2 infection  and should not be used as the sole basis for treatment or other  patient management decisions.  A negative result may occur with  improper specimen collection / handling, submission of specimen other  than nasopharyngeal swab, presence of viral mutation(s) within the  areas targeted by this assay, and inadequate number of viral copies   (<250 copies / mL). A negative result must be combined with clinical  observations, patient history, and epidemiological information. If result is POSITIVE SARS-CoV-2 target nucleic acids are DETECTED. The SARS-CoV-2 RNA is generally detectable in upper and lower  respiratory specimens  dur ing the acute phase of infection.  Positive  results are indicative of active infection with SARS-CoV-2.  Clinical  correlation with patient history and other diagnostic information is  necessary to determine patient infection status.  Positive results do  not rule out bacterial infection or co-infection with other viruses. If result is PRESUMPTIVE POSTIVE SARS-CoV-2 nucleic acids MAY BE PRESENT.   A presumptive positive result was obtained on the submitted specimen  and confirmed on repeat testing.  While 2019 novel coronavirus  (SARS-CoV-2) nucleic acids may be present in the submitted sample  additional confirmatory testing may be necessary for epidemiological  and / or clinical management purposes  to differentiate between  SARS-CoV-2 and other Sarbecovirus currently known to infect humans.  If clinically indicated additional testing with an alternate test  methodology [phone]) is advised. The SARS-CoV-2 RNA is generally  detectable in upper and lower respiratory sp ecimens during the acute  phase of infection. The expected result is Negative. Fact Sheet for Patients:  StrictlyIdeas.no Fact Sheet for Healthcare Providers: BankingDealers.co.za This test is not yet approved or cleared by the [place] FDA and has been authorized for detection and/or diagnosis of SARS-CoV-2 by FDA under an Emergency Use Authorization (EUA).  This EUA will remain in effect (meaning this test can be used) for the duration of the COVID-19 declaration under Section 564(b)(1) of the Act, 21 U.S.C. section 360bbb-3(b)(1), unless the authorization is terminated  or revoked sooner. Performed at [facility], [address], [place], [place] [address]   Culture, blood (routine x 2)     Status: None   Collection Time: 04/08/19  6:21 PM   Specimen: BLOOD  Result Value Ref Range Status   Specimen Description BLOOD RIGHT ANTECUBITAL  Final   Special Requests   Final    BOTTLES DRAWN AEROBIC AND ANAEROBIC Blood Culture adequate volume   Culture   Final    NO GROWTH 5 DAYS Performed at [facility], [address] [place], [place] [address]    Report Status 04/13/2019 FINAL  Final  Culture, blood (routine x 2)     Status: None   Collection Time: 04/08/19  6:22 PM   Specimen: BLOOD  Result Value Ref Range Status   Specimen Description BLOOD RIGHT ANTECUBITAL  Final   Special Requests   Final    BOTTLES DRAWN AEROBIC AND ANAEROBIC Blood Culture adequate volume   Culture   Final    NO GROWTH 5 DAYS Performed at [facility], [address] [place], [place] [address]    Report Status 04/13/2019 FINAL  Final  MRSA PCR Screening     Status: None   Collection Time: 04/09/19  9:45 PM   Specimen: Nasopharyngeal  Result Value Ref Range Status   MRSA by PCR NEGATIVE NEGATIVE Final    Comment:        The GeneXpert MRSA Assay (FDA approved for NASAL specimens only), is one component of a comprehensive MRSA colonization surveillance program. It is not intended to diagnose MRSA infection nor to guide or monitor treatment for MRSA infections. Performed at [facility], [address] [place],  [address]   Culture, sputum-assessment     Status: None   Collection Time: 04/13/19  9:30 AM   Specimen: Sputum  Result Value Ref Range Status   Specimen Description SPUTUM  Final   Special Requests NONE  Final   Sputum evaluation   Final    Sputum specimen not acceptable for testing.  Please recollect.  NOTIFIED L. HILTON,RN @1014   09/06/2020KAY  SPECIMEN TO BE RECOLLECTED Performed at [facility], [address], [place], [place] [address]     Report Status 04/13/2019 FINAL  Final    Radiology Studies: Dg Chest Port 1 View  Result Date: 04/13/2019 CLINICAL DATA:  Shortness of breath and hypoxia in a patient with a history of end-stage renal disease. EXAM: PORTABLE CHEST 1 VIEW COMPARISON:  Single-view of the chest 04/11/2019. PA and lateral chest 04/10/2019. FINDINGS: Bilateral airspace disease has improved but there is persistent focal airspace opacity in the right mid and lower lung zones. There is cardiomegaly. No pneumothorax. Small bilateral pleural effusions. IMPRESSION: Persistent but improved bilateral airspace disease compatible with decreased pulmonary edema. More focal airspace opacity in the right midlung could be due to edema or pneumonia. Electronically Signed   By: Inge Rise M.D.   On: 04/13/2019 09:08    Scheduled Meds:  amoxicillin-clavulanate  1 tablet Oral BID   budesonide (PULMICORT) nebulizer solution  0.5 mg Nebulization BID   Chlorhexidine Gluconate Cloth  6 each Topical Daily   Chlorhexidine Gluconate Cloth  6 each Topical Q0600   cholecalciferol  2,000 Units Oral QPM   doxercalciferol  8 mcg Intravenous Q M,W,F-HD   epoetin (EPOGEN/PROCRIT) injection  2,800 Units Intravenous Q M,W,F-HD   gabapentin  100 mg Oral BID   ipratropium  0.5 mg Nebulization QID   mouth rinse  15 mL Mouth Rinse BID   methylPREDNISolone (SOLU-MEDROL) injection  40 mg Intravenous Q12H   metoprolol tartrate  25 mg Oral BID   pantoprazole  40 mg Oral Daily   sevelamer carbonate  2,400 mg Oral TID WC   warfarin  7.5 mg Oral ONCE-1800   Warfarin - Pharmacist Dosing Inpatient   Does not apply q1800   Continuous Infusions:  sodium chloride     sodium chloride     amiodarone       LOS: 4 days    Time spent: 35 minutes   Barton Dubois, MD [facility] Pager [phone]   04/14/2019, 4:51 PM

## 2019-04-14 NOTE — Care Management Important Message (Signed)
Important Message  Patient Details  Name: Sabrina Beck MRN: [id number] Date of Birth: 1940/12/19   Medicare Important Message Given:  Yes     Tommy Medal 04/14/2019, 10:21 AM

## 2019-04-14 NOTE — Plan of Care (Signed)
  Problem: Acute Rehab PT Goals(only PT should resolve) Goal: Patient Will Perform Sitting Balance Outcome: Progressing Flowsheets (Taken 04/14/2019 1401) Patient will perform sitting balance: with min guard assist Goal: Patient Will Transfer Sit To/From Stand Outcome: Progressing Flowsheets (Taken 04/14/2019 1401) Patient will transfer sit to/from stand: with supervision Goal: Pt Will Transfer Bed To Chair/Chair To Bed Outcome: Progressing Flowsheets (Taken 04/14/2019 1401) Pt will Transfer Bed to Chair/Chair to Bed: with supervision Goal: Pt Will Perform Standing Balance Or Pre-Gait Outcome: Progressing Flowsheets (Taken 04/14/2019 1401) Pt will perform standing balance or pre-gait: with min guard assist Goal: Pt Will Ambulate Outcome: Progressing Flowsheets (Taken 04/14/2019 1401) Pt will Ambulate:  50 feet  with least restrictive assistive device  with min guard assist   Pamala Hurry D. Hartnett-Rands, MS, PT Per [place] [phone] 04/14/2019

## 2019-04-14 NOTE — Procedures (Signed)
    HEMODIALYSIS TREATMENT NOTE:  3 hour heparin-free dialysis treatment completed via left forearm AVF (15g/antegrade). Goal met: 2.3 liters removed without interruption in ultrafiltration.  All blood was returned and hemostasis was achieved in 15 minutes.    Afib with HR 120-150 throughout HD session, despite administration of lopressor po and iv prior to HD and Amiodarone bolus.  Pt denies dizziness or dyspnea although she is tachypnic and often grunting with respirations.  Minimal exertion (rolling in bed to use bedpan and transferring from recliner to bed) leaves her short of breath for several minutes after.  CXR ordered after HD.  Will attempt additional UF tomorrow.  Rockwell Alexandria, RN

## 2019-04-14 NOTE — Progress Notes (Signed)
HR AFIB and remains elevated 130-160s. [facility] notified. Awaiting orders at this time

## 2019-04-14 NOTE — Evaluation (Signed)
Physical Therapy Evaluation Patient Details Name: Sabrina Beck MRN: [id number] DOB: 16-Apr-1941 Today's Date: 04/14/2019   History of Present Illness  78 y.o. 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  Patient had been getting progressively short of breath over 5 days.  She reports that it felt like she just could not take a deep breath in.  Patient denies any chest pain, coughing, fever, dizziness.    Clinical Impression  Pt admitted with above diagnosis. Patient sitting in recliner upon arrival with son present during subjective portion. Patient agreeable to participating in evaluation. Patient required extra time to power up from sitting to standing. Patient did state she felt "woozy" upon standing that did improve over time but patient had same complaint while ambulating. Upon returning to the sitting position, patient's O2sat was 90%. Patient required min guard for safety using SPC. Patient typically used 1-3 points on the quad cane standing and ambulating. Patient may benefit from RW use next session or training with SPC. Pt currently with functional limitations due to the deficits listed below (see PT Problem List). Pt will benefit from skilled PT to increase their independence and safety with mobility to allow discharge to the venue listed below.       Follow Up Recommendations Home health PT;Supervision for mobility/OOB    Equipment Recommendations  None recommended by PT    Recommendations for Other Services       Precautions / Restrictions Precautions Precautions: Fall Restrictions Weight Bearing Restrictions: No      Mobility  Bed Mobility               General bed mobility comments: patient up on chair upon arrival and departure  Transfers Overall transfer level:  Needs assistance Equipment used: Quad cane Transfers: Sit to/from [employer] to Stand: Min guard Stand pivot transfers: Min guard       General transfer comment: patient required 2 attempts to complete power up and did not control descent from stand to sit.  Ambulation/Gait Ambulation/Gait assistance: Min guard Gait Distance (Feet): 30 Feet Assistive device: Quad cane Gait Pattern/deviations: Step-through pattern;Decreased step length - right;Decreased step length - left;Decreased stride length Gait velocity: decreased   General Gait Details: slow, labored cadence with complaints of being "woosy" and didn't "want to take too many turns." Used 1-3 points on quad cane a majority of the distance.  Stairs            Wheelchair Mobility    Modified Rankin (Stroke Patients Only)       Balance Overall balance assessment: Needs assistance Sitting-balance support: Bilateral upper extremity supported;Feet supported Sitting balance-Leahy Scale: Good     Standing balance support: Single extremity supported;During functional activity Standing balance-Leahy Scale: Fair Standing balance comment: fair/poor with quad cane                             Pertinent Vitals/Pain Pain Assessment: 0-10 Pain Score: 0-No pain    Home Living Family/patient expects to be discharged to:: Private residence Living Arrangements: Children Available Help at Discharge: Available PRN/intermittently Type of Home: House Home Access: Stairs to enter Entrance Stairs-Rails: Right Entrance Stairs-Number of Steps: 1 Home Layout: One level Home Equipment: Shower seat - built in;Walker - 2 wheels;Cane - quad;Hand held shower head;Grab bars -  tub/shower;Crutches      Prior Function Level of Independence: Independent with assistive device(s);Needs assistance   Gait / Transfers Assistance Needed: SPC used for communityand household distances  ADL's / Homemaking  Assistance Needed: assistance at times for cleaning        Hand Dominance   Dominant Hand: Right    Extremity/Trunk Assessment   Upper Extremity Assessment Upper Extremity Assessment: Generalized weakness    Lower Extremity Assessment Lower Extremity Assessment: Generalized weakness    Cervical / Trunk Assessment Cervical / Trunk Assessment: Normal  Communication   Communication: No difficulties  Cognition Arousal/Alertness: Awake/alert Behavior During Therapy: WFL for tasks assessed/performed Overall Cognitive Status: Within Functional Limits for tasks assessed                                        General Comments      Exercises     Assessment/Plan    PT Assessment Patient needs continued PT services  PT Problem List Decreased strength;Decreased mobility;Obesity;Decreased activity tolerance;Decreased balance;Decreased knowledge of use of DME       PT Treatment Interventions DME instruction;Therapeutic activities;Gait training;Therapeutic exercise;Patient/family education;Balance training;Neuromuscular re-education    PT Goals (Current goals can be found in the Care Plan section)  Acute Rehab PT Goals Patient Stated Goal: Breathe better. Time For Goal Achievement: 04/28/19 Potential to Achieve Goals: Fair    Frequency Min 3X/week   Barriers to discharge        Co-evaluation               AM-PAC PT "6 Clicks" Mobility  Outcome Measure Help needed turning from your back to your side while in a flat bed without using bedrails?: A Little Help needed moving from lying on your back to sitting on the side of a flat bed without using bedrails?: A Little Help needed moving to and from a bed to a chair (including a wheelchair)?: A Little Help needed standing up from a chair using your arms (e.g., wheelchair or bedside chair)?: A Little Help needed to walk in hospital room?: A Little Help needed climbing 3-5 steps with a railing? : A  Lot 6 Click Score: 17    End of Session Equipment Utilized During Treatment: Gait belt;Oxygen Activity Tolerance: Patient tolerated treatment well;Patient limited by fatigue Patient left: in chair;with call bell/phone within reach Nurse Communication: Mobility status PT Visit Diagnosis: Unsteadiness on feet (R26.81);Other abnormalities of gait and mobility (R26.89);Muscle weakness (generalized) (M62.81)    Time: 9417-4081 PT Time Calculation (min) (ACUTE ONLY): 30 min   Charges:   PT Evaluation $PT Eval Moderate Complexity: 1 Mod PT Treatments $Gait Training: 8-22 mins        Floria Raveling. Hartnett-Rands, MS, PT Per [place] [phone] 04/14/2019, 1:56 PM

## 2019-04-14 NOTE — Progress Notes (Addendum)
[facility] Progress Note    Assessment/ Plan:   1. HCAP- crackles in RLL persist despite HD with UF consistent with pneumonia. Continue with abx per primary svc.  Currently on Augmentin and off of vanco as MRSA neg. 2. DOE- persists and we are not making much progress. Planning on  HD with UFagain todayand follow respiratory response. She's actually positive during this hospitalization. Will need PT/OT as well as home O2 eval if persists. 3. ESRD- HD today to keep on schedule and weigh standing up on a scale + plan on daily HD as she is net positive during this hospitalization. Patient with net UF 2.5L 9/4 and 2.5L 9/6. Marland Kitchen Midodrine prior to HD to help maintain BP and help with UF. Hospital weights 109.8kg -> 101.6kg (doubt it's accurate) but  Net positive 275 ml during hospital stay. 4. Hypertension/volume- has crackles at bases and trace pretibial edema. Does not have a history of large idwg per her primary Nephrology PA. Plan for daily uf as bp tolerates today and weigh standing up if able; if she doesn't tolerate then will have to transfer to [facility] for CVV[facility]. D/W pt today and she wants to avoid that if at all possible as she doesn't want a catheter or to be supine for days. 5. Anemia- Cont with ESA and follow. Hgbcontinues to drop. Guaiac stools and follow h/h. 6. Metabolic bone disease- Cont with home meds; will check a phos tomorrow. 7. Nutrition- renal diet  Subjective:   Dyspnea only modestly improved. Denies f/cn/v/diarrhea. +Cough.   Objective:   BP (!) 144/97   Pulse (!) 119   Temp 98.8 F (37.1 C) (Oral)   Resp 20   Ht 5\' 4"  (1.626 m)   Wt 101.6 kg   SpO2 98%   BMI 38.45 kg/m   Intake/Output Summary (Last 24 hours) at 04/14/2019 1330 Last data filed at 04/13/2019 2100 Gross per 24 hour  Intake 578.31 ml  Output 3000 ml  Net -2421.69 ml   Weight change: -1.2 kg  Physical Exam: Gen: NAD CVS: tachy at 120, no rub Resp: crackles at  right base, some audible exp wheezing Abd: +BS, obese, NT Ext: no edema, LAVF +T/B  Imaging: Dg Chest Port 1 View  Result Date: 04/13/2019 CLINICAL DATA:  Shortness of breath and hypoxia in a patient with a history of end-stage renal disease. EXAM: PORTABLE CHEST 1 VIEW COMPARISON:  Single-view of the chest 04/11/2019. PA and lateral chest 04/10/2019. FINDINGS: Bilateral airspace disease has improved but there is persistent focal airspace opacity in the right mid and lower lung zones. There is cardiomegaly. No pneumothorax. Small bilateral pleural effusions. IMPRESSION: Persistent but improved bilateral airspace disease compatible with decreased pulmonary edema. More focal airspace opacity in the right midlung could be due to edema or pneumonia. Electronically Signed   By: Inge Rise M.D.   On: 04/13/2019 09:08    Labs: BMET Recent Labs  Lab 04/08/19 1600 04/09/19 0558 04/10/19 0437 04/11/19 0937 04/12/19 0348 04/13/19 0410 04/14/19 0420  NA 138 139 137 135 136 136 140  K 4.1 4.9 3.7 5.0 4.2 4.6 4.1  CL 99 104 97* 96* 95* 97* 100  CO2 25 24 30 26 28 25 25   GLUCOSE 140* 110* 117* 123* 154* 165* 128*  BUN 45* 51* 24* 46* 25* 52* 44*  CREATININE 9.98* 11.14* 6.04* 9.25* 5.08* 7.28* 5.67*  CALCIUM 9.8 9.2 9.5 9.6 9.8 10.3 10.6*  PHOS  --   --  4.3 7.5*  6.1* 5.5* 5.2*   CBC Recent Labs  Lab 04/08/19 1600 04/09/19 0558 04/10/19 0437 04/11/19 0937  WBC 8.0 7.1 6.5 6.8  NEUTROABS  --  4.8  --   --   HGB 11.0* 9.8* 8.9* 9.3*  HCT 33.9* 30.4*  28.8* 27.9* 28.8*  MCV 110.1* 110.9* 111.2* 109.9*  PLT 146* 141* 130* 154    Medications:    . amoxicillin-clavulanate  1 tablet Oral BID  . budesonide (PULMICORT) nebulizer solution  0.5 mg Nebulization BID  . Chlorhexidine Gluconate Cloth  6 each Topical Daily  . Chlorhexidine Gluconate Cloth  6 each Topical Q0600  . cholecalciferol  2,000 Units Oral QPM  . doxercalciferol  8 mcg Intravenous Q M,W,F-HD  . epoetin  (EPOGEN/PROCRIT) injection  2,800 Units Intravenous Q M,W,F-HD  . gabapentin  100 mg Oral BID  . ipratropium-albuterol  3 mL Nebulization TID  . mouth rinse  15 mL Mouth Rinse BID  . methylPREDNISolone (SOLU-MEDROL) injection  40 mg Intravenous Q12H  . metoprolol tartrate  25 mg Oral BID  . pantoprazole  40 mg Oral Daily  . sevelamer carbonate  2,400 mg Oral TID WC  . warfarin  7.5 mg Oral ONCE-1800  . Warfarin - Pharmacist Dosing Inpatient   Does not apply q1800      Otelia Santee, MD 04/14/2019, 1:30 PM

## 2019-04-15 ENCOUNTER — Encounter (HOSPITAL_COMMUNITY): Payer: Self-pay | Admitting: Cardiology

## 2019-04-15 ENCOUNTER — Inpatient Hospital Stay (HOSPITAL_COMMUNITY): Payer: Medicare HMO

## 2019-04-15 DIAGNOSIS — J189 Pneumonia, unspecified organism: Secondary | ICD-10-CM

## 2019-04-15 DIAGNOSIS — I4891 Unspecified atrial fibrillation: Secondary | ICD-10-CM

## 2019-04-15 DIAGNOSIS — N186 End stage renal disease: Secondary | ICD-10-CM

## 2019-04-15 LAB — RENAL FUNCTION PANEL
Albumin: 3.9 g/dL (ref 3.5–5.0)
Anion gap: 15 (ref 5–15)
BUN: 38 mg/dL — ABNORMAL HIGH (ref 8–23)
CO2: 25 mmol/L (ref 22–32)
Calcium: 10.3 mg/dL (ref 8.9–10.3)
Chloride: 100 mmol/L (ref 98–111)
Creatinine, Ser: 4.87 mg/dL — ABNORMAL HIGH (ref 0.44–1.00)
GFR calc Af Amer: 9 mL/min — ABNORMAL LOW (ref >60)
GFR calc non Af Amer: 8 mL/min — ABNORMAL LOW (ref >60)
Glucose, Bld: 127 mg/dL — ABNORMAL HIGH (ref 70–99)
Phosphorus: 4.8 mg/dL — ABNORMAL HIGH (ref 2.5–4.6)
Potassium: 4.1 mmol/L (ref 3.5–5.1)
Sodium: 140 mmol/L (ref 135–145)

## 2019-04-15 LAB — CREATININE, SERUM
Creatinine, Ser: 4.87 mg/dL — ABNORMAL HIGH (ref 0.44–1.00)
GFR calc Af Amer: 9 mL/min — ABNORMAL LOW (ref >60)
GFR calc non Af Amer: 8 mL/min — ABNORMAL LOW (ref >60)

## 2019-04-15 LAB — PROTIME-INR
INR: 2.3 — ABNORMAL HIGH (ref 0.8–1.2)
Prothrombin Time: 25.3 seconds — ABNORMAL HIGH (ref 11.4–15.2)

## 2019-04-15 LAB — CBC
HCT: 31.9 % — ABNORMAL LOW (ref 36.0–46.0)
Hemoglobin: 10.2 g/dL — ABNORMAL LOW (ref 12.0–15.0)
MCH: 35.1 pg — ABNORMAL HIGH (ref 26.0–34.0)
MCHC: 32 g/dL (ref 30.0–36.0)
MCV: 109.6 fL — ABNORMAL HIGH (ref 80.0–100.0)
Platelets: 256 10*3/uL (ref 150–400)
RBC: 2.91 MIL/uL — ABNORMAL LOW (ref 3.87–5.11)
RDW: 13.2 % (ref 11.5–15.5)
WBC: 10.2 10*3/uL (ref 4.0–10.5)
nRBC: 0 % (ref 0.0–0.2)

## 2019-04-15 MED ORDER — AMOXICILLIN-POT CLAVULANATE 500-125 MG PO TABS
1.0000 | ORAL_TABLET | ORAL | Status: DC
  Administered 2019-04-15 – 2019-04-17 (×3): 500 mg via ORAL
  Filled 2019-04-15 (×3): qty 1

## 2019-04-15 MED ORDER — AMOXICILLIN-POT CLAVULANATE 500-125 MG PO TABS
1.0000 | ORAL_TABLET | Freq: Once | ORAL | Status: AC
  Administered 2019-04-16: 500 mg via ORAL
  Filled 2019-04-15: qty 1

## 2019-04-15 MED ORDER — AMOXICILLIN-POT CLAVULANATE 500-125 MG PO TABS: 1.0000 | ORAL_TABLET | ORAL | Status: DC

## 2019-04-15 MED ORDER — CHLORHEXIDINE GLUCONATE CLOTH 2 % EX PADS: 6.0000 | MEDICATED_PAD | Freq: Every day | CUTANEOUS | Status: DC

## 2019-04-15 MED ORDER — PREDNISONE 20 MG PO TABS
20.0000 mg | ORAL_TABLET | Freq: Every day | ORAL | Status: DC
  Administered 2019-04-16 – 2019-04-18 (×3): 20 mg via ORAL
  Filled 2019-04-15 (×3): qty 1

## 2019-04-15 MED ORDER — ESMOLOL HCL-SODIUM CHLORIDE 2000 MG/100ML IV SOLN
100.0000 ug/kg/min | INTRAVENOUS | Status: DC
  Administered 2019-04-15: 150 ug/kg/min via INTRAVENOUS
  Administered 2019-04-15: 125 ug/kg/min via INTRAVENOUS
  Administered 2019-04-15: 11:00:00 25 ug/kg/min via INTRAVENOUS
  Administered 2019-04-15 – 2019-04-16 (×7): 150 ug/kg/min via INTRAVENOUS
  Administered 2019-04-16: 149.893 ug/kg/min via INTRAVENOUS
  Administered 2019-04-16 (×3): 150 ug/kg/min via INTRAVENOUS
  Administered 2019-04-17: 71.378 ug/kg/min via INTRAVENOUS
  Administered 2019-04-17: 35 ug/kg/min via INTRAVENOUS
  Administered 2019-04-17: 125 ug/kg/min via INTRAVENOUS
  Administered 2019-04-17: 150 ug/kg/min via INTRAVENOUS
  Administered 2019-04-17: 125 ug/kg/min via INTRAVENOUS
  Administered 2019-04-17 (×4): 150 ug/kg/min via INTRAVENOUS
  Administered 2019-04-18 (×2): 125 ug/kg/min via INTRAVENOUS
  Administered 2019-04-18: 100 ug/kg/min via INTRAVENOUS
  Filled 2019-04-15 (×4): qty 100

## 2019-04-15 MED ORDER — WARFARIN SODIUM 7.5 MG PO TABS
7.5000 mg | ORAL_TABLET | Freq: Once | ORAL | Status: AC
  Administered 2019-04-15: 7.5 mg via ORAL
  Filled 2019-04-15: qty 1

## 2019-04-15 MED ORDER — AMOXICILLIN-POT CLAVULANATE 500-125 MG PO TABS: 1.0000 | ORAL_TABLET | Freq: Once | ORAL | Status: DC

## 2019-04-15 NOTE — Progress Notes (Signed)
PT HR has been anywhere from 110s-150s. Afib is the rhythm but the rate is uncontrolled. MD aware. Cardio consulted. Continue to monitor and administer Metoprolol PRN if appropriate.

## 2019-04-15 NOTE — Consult Note (Signed)
Cardiology Consultation:   Patient ID: Sabrina Beck MRN: [id number]; DOB: 21-Jun-1941  Admit date: 04/08/2019 Date of Consult: 04/15/2019  Primary Care Provider: Redmond School, MD Primary Cardiologist: Sanda Klein, MD  Primary Electrophysiologist:  None    Patient Profile:   Sabrina Beck is a 78 y.o. female with a hx of  permanent atrial fib,  anticoagulation on coumadin,  HTN, ESRD on HD, EF 45% in Jan with acute illness,   who is being seen today for the evaluation of uncontrolled a fib RVR at the request of Dr. Dyann Kief.  History of Present Illness:   Sabrina Beck with above hx and in March decrease of her BB due to decrease in BP and fatigue.  Previously her dilt was stopped due to decrease in EF.  Chronic mixed heart failure and volume controlled with dialysis.  Pt admitted 04/08/2019 with DOE, no fevers.  Neg COVID.   + PNA. Admitted and placed on IV ABX.  A fib on admit was controlled.  Since then has been tachycardic.  She continues on coumadin.  Renal following mention of transfer to [facility] if CVVHD is needed.    EKG:  The EKG was personally reviewed and demonstrates:  Initial a fib at 98 LAD, rare PVC and Q wave in V1-V4 chronic, no ST changes.  On 04/13/19 A fib with RVR at 128 and LBBB. New tachycardia, no ST changes. Telemetry:  Telemetry was personally reviewed and demonstrates:  A fib RVR.   Troponin 22: 20 neg MI BNP 344 Today Na 140  K+ 4.1, Cr 4.87  WBC 10.2 Hgb 10.2 plts 256  INR 2.3   PCXR today COMPARISON:  Radiographs of April 13, 2019. FINDINGS: Stable cardiomegaly. No pneumothorax or definite pleural effusion is noted. Stable bilateral lung opacities are noted concerning for edema or inflammation. Bony thorax is unremarkable. IMPRESSION: Stable bilateral lung opacities as described above.  Currently BP 167/103 R 22 P 142 afebrile  Wt down from pk of 110.3 to 97.8 Kg if accurate was 101.5 Kg yesterday.  She denies chest pain.  Still with SOB.  Unaware of  rapid HR. On metoprolol 25 BID down from 50 BID at home.  Has rec'd lopressor 5 Mg IV X 5  Has rec'd 3 amiodarone boluses of 150 mg.      Per nurse these measures slow the rate for short period of time and then back up.  Dilt not used due to EF 45%.    Heart Pathway Score:     Past Medical History:  Diagnosis Date   Anemia    Atrial fibrillation ([place])    Chest pain 05/15/2006   Stress test negative for ischemia   Diastolic heart failure ([facility])    Diverticulosis    ESRD on hemodialysis ([facility])    GERD (gastroesophageal reflux disease)    Headache    Mixed hyperlipidemia    Peripheral neuropathy    PONV (postoperative nausea and vomiting)    Systemic hypertension    Uterine cancer ([place])     Past Surgical History:  Procedure Laterality Date   ABDOMINAL HYSTERECTOMY     APPENDECTOMY     AV FISTULA PLACEMENT Left 11/17/2013   Procedure: CREATION OF LEFT RADIOCEPHALIC ARTERIOVENOUS (AV) FISTULA ;  Surgeon: [name], MD;  Location: [facility] OR;  Service: Vascular;  Laterality: Left;   BREAST SURGERY Left    tumors non canerous 2- 3 different surgeries   CATARACT EXTRACTION W/PHACO Right 10/26/2016   Procedure: CATARACT  EXTRACTION PHACO AND INTRAOCULAR LENS PLACEMENT (IOC) CDE= 11.91;  Surgeon: Tonny , MD;  Location: [facility] ORS;  Service: Ophthalmology;  Laterality: Right;  right - pt knows to arrive at 6:30   CATARACT EXTRACTION W/PHACO Left 11/30/2016   Procedure: CATARACT EXTRACTION PHACO AND INTRAOCULAR LENS PLACEMENT (IOC) CDE - 7.92 ;  Surgeon: Tonny , MD;  Location: [facility];  Service: Ophthalmology;  Laterality: Left;  left   CHOLECYSTECTOMY     COLONOSCOPY W/ BIOPSIES AND POLYPECTOMY     FISTULA SUPERFICIALIZATION Left 05/19/2014   Procedure: FISTULA SUPERFICIALIZATION-LEFT ARM;  Surgeon: [name], MD;  Location: [place];  Service: Vascular;  Laterality: Left;   INSERTION OF DIALYSIS CATHETER N/A 01/24/2014   Procedure: INSERTION OF DIALYSIS  CATHETER;  Surgeon: Rosetta Posner, MD;  Location: [facility] OR;  Service: Vascular;  Laterality: N/A;   KNEE ARTHROSCOPY     TUBAL LIGATION       Home Medications:  Prior to Admission medications   Medication Sig Start Date End Date Taking? Authorizing Provider  acetaminophen (TYLENOL) 500 MG tablet Take 1,000 mg by mouth every 6 (six) hours as needed for moderate pain.    Yes [provider]  Cholecalciferol (VITAMIN D) 2000 UNITS CAPS Take 2,000 Units by mouth every evening.    Yes [provider]  gabapentin (NEURONTIN) 100 MG capsule Take 100 mg by mouth 2 (two) times daily.  09/13/18  Yes [provider]  metoprolol tartrate (LOPRESSOR) 50 MG tablet Take 1 tablet (50 mg total) by mouth 2 (two) times daily. Patient taking differently: Take 50 mg by mouth See admin instructions. Take one tablet every day at bedtime. Take one tablet every day in the morning on days that are non-dialysis days (T-TH-S-S) 10/15/18  Yes Lendon Colonel, NP  Multiple Vitamins-Minerals (PRESERVISION AREDS 2 PO) Take 1 tablet by mouth 2 (two) times daily.   Yes [provider]  multivitamin (RENA-VIT) TABS tablet Take 1 tablet by mouth every evening.  07/06/18  Yes [provider]  Omega-3 Fatty Acids (OMEGA-3 FISH OIL) 300 MG CAPS Take 300 mg by mouth 2 (two) times daily.   Yes [provider]  omeprazole (PRILOSEC) 20 MG capsule Take 20 mg by mouth every morning.    Yes [provider]  Propylene Glycol (SYSTANE BALANCE) 0.6 % SOLN Place 1 drop into both eyes 3 (three) times daily as needed (for burning or dry eyes).    Yes [provider]  RENVELA 800 MG tablet Take 2,400 mg by mouth 3 (three) times daily with meals. Takes 1 tablet with snacks 04/17/15  Yes [provider]  warfarin (COUMADIN) 5 MG tablet Take 1.5 to 2 tablets by mouth daily as directed by coumadin clinic Patient taking differently: Take 7.5-10 mg by mouth See admin  instructions. 10mg  (10mg  total) on Mondays, then take 7.5mg  (1.5 tablets) on all other days in the evening. 10/15/18  Yes Lendon Colonel, NP    Inpatient Medications: Scheduled Meds:  amoxicillin-clavulanate  1 tablet Oral BID   budesonide (PULMICORT) nebulizer solution  0.5 mg Nebulization BID   Chlorhexidine Gluconate Cloth  6 each Topical Daily   Chlorhexidine Gluconate Cloth  6 each Topical Q0600   cholecalciferol  2,000 Units Oral QPM   doxercalciferol  8 mcg Intravenous Q M,W,F-HD   epoetin (EPOGEN/PROCRIT) injection  2,800 Units Intravenous Q M,W,F-HD   gabapentin  100 mg Oral BID   ipratropium  0.5 mg Nebulization QID  mouth rinse  15 mL Mouth Rinse BID   methylPREDNISolone (SOLU-MEDROL) injection  40 mg Intravenous Q12H   metoprolol tartrate  25 mg Oral BID   pantoprazole  40 mg Oral Daily   sevelamer carbonate  2,400 mg Oral TID WC   Warfarin - Pharmacist Dosing Inpatient   Does not apply q1800   Continuous Infusions:  sodium chloride     sodium chloride     PRN Meds: sodium chloride, sodium chloride, acetaminophen, albuterol, lidocaine (PF), lidocaine-prilocaine, metoprolol tartrate, midodrine, ondansetron (ZOFRAN) IV, pentafluoroprop-tetrafluoroeth  Allergies:   No Known Allergies  Social History:   Social History   Socioeconomic History   Marital status: Widowed    Spouse name: Not on file   Number of children: Not on file   Years of education: Not on file   Highest education level: Not on file  Occupational History   Not on file  Social Needs   Financial resource strain: Not on file   Food insecurity    Worry: Not on file    Inability: Not on file   Transportation needs    Medical: Not on file    Non-medical: Not on file  Tobacco Use   Smoking status: Former Smoker    Quit date: 08/08/1999    Years since quitting: 19.6   Smokeless tobacco: Never Used  Substance and Sexual Activity   Alcohol use: No    Alcohol/week:  0.0 standard drinks   Drug use: No   Sexual activity: Not Currently  Lifestyle   Physical activity    Days per week: Not on file    Minutes per session: Not on file   Stress: Not on file  Relationships   Social connections    Talks on phone: Not on file    Gets together: Not on file    Attends religious service: Not on file    Active member of club or organization: Not on file    Attends meetings of clubs or organizations: Not on file    Relationship status: Not on file   Intimate partner violence    Fear of current or ex partner: Not on file    Emotionally abused: Not on file    Physically abused: Not on file    Forced sexual activity: Not on file  Other Topics Concern   Not on file  Social History Narrative   Lives in a one story home.  One of her sons lives with her.  Has 2 sons.  Retired Oncologist.  Eduction: high school.    Family History:    Family History  Problem Relation Age of Onset   Diabetes Mother    Hypertension Mother    Diabetes Father    Heart disease Father        before age 63   Heart attack Father    Diabetes Sister    Hyperlipidemia Sister    Hypertension Sister    Cancer Brother    Diabetes Brother    Heart disease Brother    Hypertension Brother    Heart attack Brother      ROS:  Please see the history of present illness.  General:no colds or fevers, no weight changes Skin:no rashes or ulcers HEENT:no blurred vision, no congestion CV:see HPI PUL:see HPI GI:no diarrhea constipation or melena, no indigestion with hx of GERD GU:no hematuria, no dysuria Renal ESRD on HD MWF  MS:no joint pain, no claudication Neuro:no syncope, no lightheadedness Endo:no diabetes, no thyroid disease  All other ROS reviewed and negative.     Physical Exam/Data:   Vitals:   04/15/19 0200 04/15/19 0300 04/15/19 0400 04/15/19 0500  BP: (!) 130/91 (!) 138/93 (!) 157/91 (!) 160/87  Pulse: (!) 131 (!) 128 87 (!) 114  Resp: 20 (!)  22 (!) 21 (!) 24  Temp:   98.8 F (37.1 C)   TempSrc:   Oral   SpO2: 96% 96% 96% 95%  Weight:    97.8 kg  Height:        Intake/Output Summary (Last 24 hours) at 04/15/2019 0758 Last data filed at 04/14/2019 2000 Gross per 24 hour  Intake 328.16 ml  Output 2400 ml  Net -2071.84 ml   Last 3 Weights 04/15/2019 04/14/2019 04/14/2019  Weight (lbs) 215 lb 9.8 oz 223 lb 12.3 oz 223 lb 15.8 oz  Weight (kg) 97.8 kg 101.5 kg 101.6 kg     Body mass index is 37.01 kg/m.  General:  Well nourished, well developed, in no acute distress though dyspneic with talking HEENT: normal Lymph: no adenopathy Neck: no JVD, sitting up Endocrine:  No thryomegaly Vascular: No carotid bruits; pedal pulses 2+ bilaterally  Cardiac:  irreg irreg; no murmur  [place] or rub + rapid Lungs:  Decreased breath sounds in bases to auscultation bilaterally, no wheezing, rhonchi or rales  Abd: soft, nontender, no hepatomegaly  Ext: no edema Musculoskeletal:  No deformities, BUE and BLE strength normal and equal Skin: warm and dry  Neuro:  Alert and oriented X 3 MAE follows commands, no focal abnormalities noted Psych:  Normal affect    Relevant CV Studies: Echo 08/22/18 Study Conclusions  - Left ventricle: The cavity size was normal. Wall thickness was   increased in a pattern of moderate LVH. The estimated ejection   fraction was 45%. There is hypokinesis of the midinferoseptal   myocardium. The study was not technically sufficient to allow   evaluation of LV diastolic dysfunction due to atrial   fibrillation. - Aortic valve: Mildly calcified annulus. Trileaflet; mildly   calcified leaflets. - Mitral valve: Mildly calcified annulus. There was mild   regurgitation. - Left atrium: The atrium was moderately dilated. - Right atrium: The atrium was mildly to moderately dilated. - Atrial septum: No defect or patent foramen ovale was identified. - Tricuspid valve: There was mild regurgitation. - Pulmonary arteries: PA  peak pressure: 21 mm Hg (S). - Pericardium, extracardiac: There was no pericardial effusion.  ------------------------------------------------------------------- Study data:  Comparison was made to the study of 12/22/2013.  Study status:  Routine.  Procedure:  Transthoracic echocardiography. Image quality was adequate.  Study completion:  There were no complications.          Transthoracic echocardiography.  M-mode, complete 2D, spectral Doppler, and color Doppler.  Birthdate: Patient birthdate: January 09, 1941.  Age:  Patient is 78 yr old.  Sex: Gender: female.    BMI: 36.7 kg/m^2.  Blood pressure:     116/79 Patient status:  Inpatient.  Study date:  Study date: 08/22/2018. Study time: 02:16 PM.  Location:  Bedside.  -------------------------------------------------------------------  ------------------------------------------------------------------- Left ventricle:  The cavity size was normal. Wall thickness was increased in a pattern of moderate LVH. The estimated ejection fraction was 45%.  Regional wall motion abnormalities:   There is hypokinesis of the midinferoseptal myocardium. The study was not technically sufficient to allow evaluation of LV diastolic dysfunction due to atrial fibrillation.  ------------------------------------------------------------------- Aortic valve:   Mildly calcified annulus. Trileaflet; mildly calcified leaflets.  Doppler:  There was no significant regurgitation.  ------------------------------------------------------------------- Aorta:  Aortic root: The aortic root was normal in size.  ------------------------------------------------------------------- Mitral valve:   Mildly calcified annulus.  Doppler:  There was mild regurgitation.    Mean gradient (D): 4 mm Hg. Peak gradient (D): 7 mm Hg.  ------------------------------------------------------------------- Left atrium:  The atrium was moderately  dilated.  ------------------------------------------------------------------- Atrial septum:  No defect or patent foramen ovale was identified.   ------------------------------------------------------------------- Right ventricle:  The cavity size was normal. Systolic function was normal.  ------------------------------------------------------------------- Pulmonic valve:    The valve appears to be grossly normal. Doppler:  There was no significant regurgitation.  ------------------------------------------------------------------- Tricuspid valve:   The valve appears to be grossly normal. Doppler:  There was mild regurgitation.  ------------------------------------------------------------------- Right atrium:  The atrium was mildly to moderately dilated.  ------------------------------------------------------------------- Pericardium:  There was no pericardial effusion.  ------------------------------------------------------------------- Systemic veins: Inferior vena cava: The vessel was normal in size. The respirophasic diameter changes were in the normal range (>= 50%), consistent with normal central venous pressure.   Laboratory Data:  High Sensitivity Troponin:   Recent Labs  Lab 04/08/19 1600 04/08/19 1822  TROPONINIHS 20* 22*     Chemistry Recent Labs  Lab 04/13/19 0410 04/14/19 0420 04/15/19 0434  NA 136 140 140  K 4.6 4.1 4.1  CL 97* 100 100  CO2 25 25 25   GLUCOSE 165* 128* 127*  BUN 52* 44* 38*  CREATININE 7.28* 5.67* 4.87*   4.87*  CALCIUM 10.3 10.6* 10.3  GFRNONAA 5* 7* 8*   8*  GFRAA 6* 8* 9*   9*  ANIONGAP 14 15 15     Recent Labs  Lab 04/09/19 0558  04/13/19 0410 04/14/19 0420 04/15/19 0434  PROT 6.1*  --   --   --   --   ALBUMIN 3.2*   < > 3.8 3.8 3.9  AST 13*  --   --   --   --   ALT 22  --   --   --   --   ALKPHOS 60  --   --   --   --   BILITOT 0.7  --   --   --   --    < > = values in this interval not displayed.    Hematology Recent Labs  Lab 04/11/19 0937 04/14/19 1730 04/15/19 0434  WBC 6.8 12.1* 10.2  RBC 2.62* 2.88* 2.91*  HGB 9.3* 10.2* 10.2*  HCT 28.8* 31.6* 31.9*  MCV 109.9* 109.7* 109.6*  MCH 35.5* 35.4* 35.1*  MCHC 32.3 32.3 32.0  RDW 13.3 13.3 13.2  PLT 154 266 256   BNP Recent Labs  Lab 04/08/19 1600  BNP 344.0*    DDimer No results for input(s): DDIMER in the last 168 hours.   Radiology/Studies:  Ct Head Wo Contrast  Result Date: 04/11/2019 CLINICAL DATA:  Transient ischemic attack.  Slurred speech. EXAM: CT HEAD WITHOUT CONTRAST TECHNIQUE: Contiguous axial images were obtained from the base of the skull through the vertex without intravenous contrast. COMPARISON:  CT scan of September 05, 2014. FINDINGS: Brain: No evidence of acute infarction, hemorrhage, hydrocephalus, extra-axial collection or mass lesion/mass effect. Vascular: No hyperdense vessel or unexpected calcification. Skull: Normal. Negative for fracture or focal lesion. Sinuses/Orbits: No acute finding. Other: None. IMPRESSION: No acute intracranial abnormality seen. Electronically Signed   By: Marijo Conception M.D.   On: 04/11/2019 12:08   Dg Chest Port 1 View  Result Date: 04/15/2019 CLINICAL DATA:  Shortness of breath. EXAM: PORTABLE CHEST 1 VIEW COMPARISON:  Radiographs of April 13, 2019. FINDINGS: Stable cardiomegaly. No pneumothorax or definite pleural effusion is noted. Stable bilateral lung opacities are noted concerning for edema or inflammation. Bony thorax is unremarkable. IMPRESSION: Stable bilateral lung opacities as described above. Electronically Signed   By: Marijo Conception M.D.   On: 04/15/2019 07:31   Dg Chest Port 1 View  Result Date: 04/13/2019 CLINICAL DATA:  Shortness of breath and hypoxia in a patient with a history of end-stage renal disease. EXAM: PORTABLE CHEST 1 VIEW COMPARISON:  Single-view of the chest 04/11/2019. PA and lateral chest 04/10/2019. FINDINGS: Bilateral airspace disease has  improved but there is persistent focal airspace opacity in the right mid and lower lung zones. There is cardiomegaly. No pneumothorax. Small bilateral pleural effusions. IMPRESSION: Persistent but improved bilateral airspace disease compatible with decreased pulmonary edema. More focal airspace opacity in the right midlung could be due to edema or pneumonia. Electronically Signed   By: Inge Rise M.D.   On: 04/13/2019 09:08   Dg Chest Port 1 View  Result Date: 04/11/2019 CLINICAL DATA:  Increasing shortness of breath following dialysis. EXAM: PORTABLE CHEST 1 VIEW COMPARISON:  04/10/2019. FINDINGS: Poor inspiration. Progressive enlargement of the cardiac silhouette with increased prominence of the pulmonary vasculature interstitial markings. Progressive bilateral airspace opacities, greater on the right. No definite pleural fluid seen. Unremarkable bones. IMPRESSION: 1. Progressive cardiomegaly and changes of congestive heart failure. 2. Progressive bilateral alveolar edema or pneumonia, greater on the right. Electronically Signed   By: Claudie Revering M.D.   On: 04/11/2019 19:54    Assessment and Plan:   1. Atrial fib with RVR on lopressor 25 BID at home was on 50 mg BID,  BP is elevated as well.  May need Increase to 50 mg BID but with soft BP with dialysis this is difficult. .  Dr. Harl Bowie to see.   Both IV BB and IV amiodarone slow rate. ? Amiodarone drip   2. anticoagulation on coumadin.  INR 2.3.   3. PNA per IM on antibiotics  4. ESRD on HD MWF.  Did have dialysis yesterday.  For dialysis today as well.  5. Chronic diastolic and systolic HF last EF in Jan was 45%.  Mod LVH. Possible recheck this admit.  6. HTN though soft BPs with dialysis.  Plan for midodrine today.   7. Anemia of chronic disease per IM and Renal.         For questions or updates, please contact [place] Please consult [email or web address] for contact info under     Signed, Cecilie Kicks, NP  04/15/2019 7:58  AM   Attending note  Patient seen and discussed with PA Dorene Ar, I agree with her documentation above. History of permanent afib on coumadin, chronic diastolic HF, HTN, ESRD, chronic systolic HF LVEF 12%, admitted with 5 days of progressive SOB. Admitted for management of health care associated pneumonia, admission has been complicated by afib with RVR   From cardiology clinic notes management of afib has been complicated by low bp's during HD sessions. Issues with afib with RVR in setting of pneumonia and diastolic HF. Currently on lopressor 25mg  bid, lopressor 5mg  IV prn, and amio boluses 150mg  x 3. BPs are stable and actually elevated.   Admit labs WBC 8 Hgb 11 Plt 146 K 4.1 Cr 9.98 BUN 45 BNP 344 Lactic acid 1.6  hstrop 20-->22--> COVID neg CXR: RLLL consolidation CT head no acute process  EKG afib, LAFB From clinic notes some issues with low bp's on HD.    Patient with history of permanent afib, outpatient management complicated by low bp's during HD. This admission rates exacerbated by HF, pneumonia, high dose steroids, bronchodilators, hypoxia. Not controlled with oral lopressor, IV lopressor, and amio boluses.  I think as acute issues resolve she will get back to her baseline as far as rate control. Would try esmolol drip, with just mildly decreased LVEF I would consider dilt but I think this would cause more significant hypotension with her HD sessions than esmolol. With esmolol's short half life should also be friendlier if we run into significant hypotension with HD. Ok to continue oral lopressor at this time.   ESRD and fluid status managed per nephrology with HD   Carlyle Dolly MD

## 2019-04-15 NOTE — Progress Notes (Addendum)
[facility] Progress Note    Assessment/ Plan:   1. Dyspnea on exertion- fluid overload.  UF with HD as able as below. Will need PT/OT as well as home O2 eval if persists  2. ESRD- Normally MWF schedule at [facility]. s/p HD on 9/6 for dyspnea/overload (3 kg net off) and had HD on 9/7 as well - Will perform UF today, 9/8, as planned as well and plan for HD on 9/9 to keep MWF schedule - Midodrine prior to HD to help maintain BP and help with UF.  - If she doesn't tolerate then will have to transfer to [facility] for CVV[facility].  Patient wants to avoid that if possible as she doesn't want a catheter or to be supine for days.  Note that she is charted as being below her outpatient dry weight  3. A fib with RVR - rate control per primary team and cardiology   4. Hypertension/volume- has crackles at bases and trace pretibial edema. Does not have a history of large idwg per her primary Nephrology PA.  - Plan for daily uf as above as bp tolerates.  Weigh standing up if able.    5. HCAP- crackles in RLL persist despite HD with UF consistent with pneumonia. abx per primary svc.  on augmentin.  Would reduce augmentin to daily dosing given ESRD   6. Anemiaof CKD- Cont with ESA and follow.   7. Metabolic bone disease- Cont with home meds.  Phos 4.8 on 9/8.  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 Nutrition- renal diet  Subjective:   Patient is s/p HD with 2.3 kg UF removed per HD RN charting.  She has had a fib with RVR and HR up to 150's despite medications.  Per nursing HR mostly 130-140 and will drop to 110's with meds.  Cardiology has been consulted.  Sabrina Beck states normally goes to [facility] MWF via LUE AVF  Review of systems:  Patient reports shorntess of breath which is improving.  Denies chest pain.  Denies current nausea or vomiting; had nausea earlier this admission.     Objective:   BP (!) 160/87   Pulse (!) 114   Temp 98.8 F (37.1 C) (Oral)    Resp (!) 24   Ht 5\' 4"  (1.626 m)   Wt 97.8 kg   SpO2 95%   BMI 37.01 kg/m   Intake/Output Summary (Last 24 hours) at 04/15/2019 0737 Last data filed at 04/14/2019 2000 Gross per 24 hour  Intake 328.16 ml  Output 2400 ml  Net -2071.84 ml   Weight change: -2.7 kg  Physical Exam: General elderly female in bed in no acute distress HEENT normocephalic atraumatic extraocular movements intact sclera anicteric Neck supple trachea midline Lungs reduced breath sounds at bases; no overt crackles and no wheezing this AM; on oxygen at 2liters per nasal cannula Heart tachycardic and irregular; no rub Abdomen soft nontender obese body habitus  Extremities trace edema  Psych normal mood and affect Neuro - alert and oriented Access: LUE AVF bruit and thrill +  Imaging: Dg Chest Port 1 View  Result Date: 04/15/2019 CLINICAL DATA:  Shortness of breath. EXAM: PORTABLE CHEST 1 VIEW COMPARISON:  Radiographs of April 13, 2019. FINDINGS: Stable cardiomegaly. No pneumothorax or definite pleural effusion is noted. Stable bilateral lung opacities are noted concerning for edema or inflammation. Bony thorax is unremarkable. IMPRESSION: Stable bilateral lung opacities as described above. Electronically Signed   By: Marijo Conception  M.D.   On: 04/15/2019 07:31    Labs: BMET Recent Labs  Lab 04/09/19 9728 04/10/19 2060 04/11/19 1561 04/12/19 0348 04/13/19 0410 04/14/19 0420 04/15/19 0434  NA 139 137 135 136 136 140 140  K 4.9 3.7 5.0 4.2 4.6 4.1 4.1  CL 104 97* 96* 95* 97* 100 100  CO2 24 30 26 28 25 25 25   GLUCOSE 110* 117* 123* 154* 165* 128* 127*  BUN 51* 24* 46* 25* 52* 44* 38*  CREATININE 11.14* 6.04* 9.25* 5.08* 7.28* 5.67* 4.87*  4.87*  CALCIUM 9.2 9.5 9.6 9.8 10.3 10.6* 10.3  PHOS  --  4.3 7.5* 6.1* 5.5* 5.2* 4.8*   CBC Recent Labs  Lab 04/09/19 0558 04/10/19 0437 04/11/19 0937 04/14/19 1730 04/15/19 0434  WBC 7.1 6.5 6.8 12.1* 10.2  NEUTROABS 4.8  --   --   --   --   HGB  9.8* 8.9* 9.3* 10.2* 10.2*  HCT 30.4*  28.8* 27.9* 28.8* 31.6* 31.9*  MCV 110.9* 111.2* 109.9* 109.7* 109.6*  PLT 141* 130* 154 266 256    Medications:    . amoxicillin-clavulanate  1 tablet Oral BID  . budesonide (PULMICORT) nebulizer solution  0.5 mg Nebulization BID  . Chlorhexidine Gluconate Cloth  6 each Topical Daily  . Chlorhexidine Gluconate Cloth  6 each Topical Q0600  . cholecalciferol  2,000 Units Oral QPM  . doxercalciferol  8 mcg Intravenous Q M,W,F-HD  . epoetin (EPOGEN/PROCRIT) injection  2,800 Units Intravenous Q M,W,F-HD  . gabapentin  100 mg Oral BID  . ipratropium  0.5 mg Nebulization QID  . mouth rinse  15 mL Mouth Rinse BID  . methylPREDNISolone (SOLU-MEDROL) injection  40 mg Intravenous Q12H  . metoprolol tartrate  25 mg Oral BID  . pantoprazole  40 mg Oral Daily  . sevelamer carbonate  2,400 mg Oral TID WC  . Warfarin - Pharmacist Dosing Inpatient   Does not apply q1800    [id number] Bath 2K/2.5CaTime 3.5 hrsHeparin none. AccessLAVFBFR 400DFR 600  Hectoral 9.33mcg IV/HD Epogen 2,800Units IV/HD   Sabrina Beck 04/15/2019, 7:37 AM

## 2019-04-15 NOTE — Procedures (Signed)
    HEMODIALYSIS TREATMENT NOTE:  3.5 hour heparin-free HD session via left forearm AVF (15g/antegrade). Goal met: 2.5 liters removed. UF was interrupted x 15 minutes for SBP<100.  All blood was returned and hemostasis was achieved in 15 minutes. Post-wt 96.5kg  Remains tachycardic with HR 120s-140.  On esmolol infusion; titration per primary RN.  Rockwell [place], RN

## 2019-04-15 NOTE — Progress Notes (Signed)
PROGRESS NOTE    Sabrina Beck  UJW:[id number] DOB: 1941-06-23 DOA: 04/08/2019 PCP: Redmond School, MD     Brief Narrative:  78 y.o. 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  Patient had been getting progressively short of breath over 5 days.  She reports that it felt like she just could not take a deep breath in.  Patient denies any chest pain, coughing, fever, dizziness.  Patient has been admitted for pneumonia before in January 2020.  This episode feels like that episode only not as bad.  Patient reports no sick contacts.  Covid test negative.  ED course Chemistry shows a glucose of 140, BUN of 45, creatinine of 9.98, and GFR of 3.  Patient has a BNP of 344.  Initial troponin is 20.  Hematology reveals no leukocytosis, but a macrocytic anemia with a hemoglobin of 11.  Chest x-ray was done that shows enlargement of cardiac silhouette.  Right lower lobe consolidation consistent with pneumonia.  Blood cultures drawn.  Lactic acid 1.6.  Troponin stable at 22.  Second lactic acid 1.3.   Assessment & Plan: 1-shortness of breath and hypoxia: In the setting of healthcare associated pneumonia and presumed acute on chronic diastolic heart failure. -Continue antibiotics by mouth, plan is for 3 more days to complete therapy. -patient has remained afebrile. Elevated WBC's due to steroids usage.  -Appreciate nephrology service and assistance; will follow recommendations for next HD treatment.  -3L off from HD treatment on 04/13/19 and 2L off on treatment of 9/7. Plan is for hemodialysis again today and tomorrow. -repeated x-ray 9/8 demonstrating vascular congestion and bilateral edema.  -Continue atrovent, Pulmicort and low-dose steroids -Continue flutter valve and Mucinex -Continue weaning of oxygen  supplementation as tolerated. -Patient in need of further volume status management with hemodialysis.   2-end-stage renal disease -Continue hemodialysis -Follow further nephrology recommendations. -plan is for HD today and again tomorrow.  3-chronic atrial fibrillation -Uncontrolled -Cardiology service has been consulted -Continue metoprolol and PRN IV metoprolol -amiodarone boluses recommended by cardiology fellow on 9/7 without significant effect.  -uncontrolled HR most likely triggered by CHF exacerbation.  -patient denies CP.  4-secondary hyperparathyroidism -Continue Renvela  5-anemia of chronic disease -Associated with chronic renal failure -Follow recommendations by nephrology for IV iron and Epogen therapy. -Normal B12 level.  6-morbid obesity -Low calorie diet, portion control and increase physical activity discussed with patient -Body mass index is 35.33 kg/m.  7-gastroesophageal reflux disease -Continue PPI.  8-hypertension/hypotension  -Stable and well-controlled currently; even patient has been experiencing soft blood pressures during hemodialysis treatment. -Continue metoprolol -Follow heart healthy diet. -per nephrology service continue the use of midodrine with HD.   DVT prophylaxis: Chronically on warfarin Code Status: Full code Family Communication: Son at bedside Disposition Plan: Remains inpatient and in a stepdown unit, continue oral antibiotics and continue volume stabilization with hemodialysis treatment. Follow cardiology service for assistance with uncontrolled A. Fib.   Consultants:   Nephrology service  Cardiology Service  Procedures:   See below for x-ray reports  Antimicrobials:  Anti-infectives (From admission, onward)   Start     Dose/Rate Route Frequency Ordered Stop   04/16/19 1600  amoxicillin-clavulanate (AUGMENTIN) 500-125 MG per tablet 500 mg     1 tablet Oral Once in dialysis 04/15/19 1022     04/15/19 2200   amoxicillin-clavulanate (AUGMENTIN) 500-125 MG  per tablet 500 mg  Status:  Discontinued     1 tablet Oral Every 24 hours 04/15/19 0844 04/15/19 1007   04/15/19 1600  amoxicillin-clavulanate (AUGMENTIN) 500-125 MG per tablet 500 mg     1 tablet Oral Once in dialysis 04/15/19 1022     04/15/19 1100  amoxicillin-clavulanate (AUGMENTIN) 500-125 MG per tablet 500 mg     1 tablet Oral Every 24 hours 04/15/19 1007     04/13/19 1000  amoxicillin-clavulanate (AUGMENTIN) 500-125 MG per tablet 500 mg  Status:  Discontinued     1 tablet Oral 2 times daily 04/13/19 0741 04/15/19 0844   04/11/19 1600  vancomycin (VANCOCIN) IVPB 1000 mg/200 mL premix  Status:  Discontinued     1,000 mg 200 mL/hr over 60 Minutes Intravenous Once per day on Mon Wed Fri 04/09/19 1755 04/10/19 1003   04/10/19 1800  ceFEPIme (MAXIPIME) 500 mg in dextrose 5 % 50 mL IVPB  Status:  Discontinued     500 mg 100 mL/hr over 30 Minutes Intravenous Every 24 hours 04/09/19 1749 04/09/19 1750   04/09/19 1900  azithromycin (ZITHROMAX) 500 mg in sodium chloride 0.9 % 250 mL IVPB  Status:  Discontinued     500 mg 250 mL/hr over 60 Minutes Intravenous Every 24 hours 04/08/19 2300 04/11/19 0955   04/09/19 1800  cefTRIAXone (ROCEPHIN) 2 g in sodium chloride 0.9 % 100 mL IVPB  Status:  Discontinued     2 g 200 mL/hr over 30 Minutes Intravenous Every 24 hours 04/08/19 2300 04/09/19 1725   04/09/19 1800  ceFEPIme (MAXIPIME) 1 g in sodium chloride 0.9 % 100 mL IVPB  Status:  Discontinued     1 g 200 mL/hr over 30 Minutes Intravenous  Once 04/09/19 1725 04/09/19 1750   04/09/19 1800  vancomycin (VANCOCIN) IVPB 1000 mg/200 mL premix     1,000 mg 200 mL/hr over 60 Minutes Intravenous Every 1 hr x 2 04/09/19 1745 04/09/19 2000   04/09/19 1800  ceFEPIme (MAXIPIME) 1 g in sodium chloride 0.9 % 100 mL IVPB  Status:  Discontinued     1 g 200 mL/hr over 30 Minutes Intravenous Every 24 hours 04/09/19 1751 04/13/19 0741   04/08/19 1800  cefTRIAXone  (ROCEPHIN) 1 g in sodium chloride 0.9 % 100 mL IVPB     1 g 200 mL/hr over 30 Minutes Intravenous  Once 04/08/19 1750 04/08/19 2035   04/08/19 1800  azithromycin (ZITHROMAX) 500 mg in sodium chloride 0.9 % 250 mL IVPB     500 mg 250 mL/hr over 60 Minutes Intravenous  Once 04/08/19 1750 04/08/19 2035      Subjective: No fever, no CP. Still SOB with exertion. Patient also reports palpitation and orthopnea. Uncontrolled HR appreciated and requiring 2L [place] supplementation.    Objective: Vitals:   04/15/19 0950 04/15/19 0951 04/15/19 0952 04/15/19 1000  BP:  140/78  (!) 140/96  Pulse:  (!) 126  (!) 109  Resp: 20 (!) 27 17 19   Temp:      TempSrc:      SpO2:  94%  95%  Weight:      Height:        Intake/Output Summary (Last 24 hours) at 04/15/2019 1044 Last data filed at 04/14/2019 2000 Gross per 24 hour  Intake 328.16 ml  Output 2400 ml  Net -2071.84 ml   Filed Weights   04/14/19 1440 04/15/19 0500 04/15/19 0800  Weight: 101.5 kg 97.8 kg 93.4 kg    Examination:  General exam: Alert, awake, oriented x 3; feeling slightly better from her breathing, even still SOB with exertion and complaining of orthopnea. Reports slight palpitation.  Respiratory system: fine crackles at the bases, positive rhonchi, no wheezing.  Cardiovascular system: irregular, no rubs, no gallops.  Gastrointestinal system: Abdomen is obese nondistended, soft and nontender. No organomegaly or masses felt. Normal bowel sounds heard. Central nervous system: Alert and oriented. No focal neurological deficits. Extremities: No cyanosis, no clubbing, trace edema bilaterally.  Skin: No rashes, no open wounds. Positive left forearm AVF with good bruit. Psychiatry: Judgement and insight appear normal. Mood & affect appropriate.   Data Reviewed: I have personally reviewed following labs and imaging studies  CBC: Recent Labs  Lab 04/09/19 0558 04/10/19 0437 04/11/19 0937 04/14/19 1730 04/15/19 0434  WBC 7.1 6.5 6.8  12.1* 10.2  NEUTROABS 4.8  --   --   --   --   HGB 9.8* 8.9* 9.3* 10.2* 10.2*  HCT 30.4*  28.8* 27.9* 28.8* 31.6* 31.9*  MCV 110.9* 111.2* 109.9* 109.7* 109.6*  PLT 141* 130* 154 266 893   Basic Metabolic Panel: Recent Labs  Lab 04/11/19 0937 04/12/19 0348 04/13/19 0410 04/14/19 0420 04/15/19 0434  NA 135 136 136 140 140  K 5.0 4.2 4.6 4.1 4.1  CL 96* 95* 97* 100 100  CO2 26 28 25 25 25   GLUCOSE 123* 154* 165* 128* 127*  BUN 46* 25* 52* 44* 38*  CREATININE 9.25* 5.08* 7.28* 5.67* 4.87*  4.87*  CALCIUM 9.6 9.8 10.3 10.6* 10.3  PHOS 7.5* 6.1* 5.5* 5.2* 4.8*   GFR: Estimated Creatinine Clearance: 10.4 mL/min (A) (by C-G formula based on SCr of 4.87 mg/dL (H)).   Liver Function Tests: Recent Labs  Lab 04/09/19 0558  04/11/19 8101 04/12/19 0348 04/13/19 0410 04/14/19 0420 04/15/19 0434  AST 13*  --   --   --   --   --   --   ALT 22  --   --   --   --   --   --   ALKPHOS 60  --   --   --   --   --   --   BILITOT 0.7  --   --   --   --   --   --   PROT 6.1*  --   --   --   --   --   --   ALBUMIN 3.2*   < > 3.7 4.0 3.8 3.8 3.9   < > = values in this interval not displayed.   Coagulation Profile: Recent Labs  Lab 04/11/19 0434 04/12/19 0348 04/13/19 0409 04/14/19 0419 04/15/19 0434  INR 1.7* 1.7* 2.1* 2.6* 2.3*   Urine analysis:    Component Value Date/Time   COLORURINE YELLOW 03/03/2012 1500   APPEARANCEUR CLEAR 03/03/2012 1500   LABSPEC 1.020 03/03/2012 1500   PHURINE 6.0 03/03/2012 1500   GLUCOSEU 100 (A) 03/03/2012 1500   HGBUR TRACE (A) 03/03/2012 1500   BILIRUBINUR NEGATIVE 03/03/2012 1500   KETONESUR NEGATIVE 03/03/2012 1500   PROTEINUR 100 (A) 03/03/2012 1500   UROBILINOGEN 0.2 03/03/2012 1500   NITRITE NEGATIVE 03/03/2012 1500   LEUKOCYTESUR NEGATIVE 03/03/2012 1500    Recent Results (from the past 240 hour(s))  SARS Coronavirus 2 [facility] order, Performed in [facility] hospital lab) Nasopharyngeal Nasopharyngeal Swab     Status: None    Collection Time: 04/08/19  5:50 PM   Specimen: Nasopharyngeal Swab  Result Value Ref  Range Status   SARS Coronavirus 2 NEGATIVE NEGATIVE Final    Comment: (NOTE) If result is NEGATIVE SARS-CoV-2 target nucleic acids are NOT DETECTED. The SARS-CoV-2 RNA is generally detectable in upper and lower  respiratory specimens during the acute phase of infection. The lowest  concentration of SARS-CoV-2 viral copies this assay can detect is 250  copies / mL. A negative result does not preclude SARS-CoV-2 infection  and should not be used as the sole basis for treatment or other  patient management decisions.  A negative result may occur with  improper specimen collection / handling, submission of specimen other  than nasopharyngeal swab, presence of viral mutation(s) within the  areas targeted by this assay, and inadequate number of viral copies  (<250 copies / mL). A negative result must be combined with clinical  observations, patient history, and epidemiological information. If result is POSITIVE SARS-CoV-2 target nucleic acids are DETECTED. The SARS-CoV-2 RNA is generally detectable in upper and lower  respiratory specimens dur ing the acute phase of infection.  Positive  results are indicative of active infection with SARS-CoV-2.  Clinical  correlation with patient history and other diagnostic information is  necessary to determine patient infection status.  Positive results do  not rule out bacterial infection or co-infection with other viruses. If result is PRESUMPTIVE POSTIVE SARS-CoV-2 nucleic acids MAY BE PRESENT.   A presumptive positive result was obtained on the submitted specimen  and confirmed on repeat testing.  While 2019 novel coronavirus  (SARS-CoV-2) nucleic acids may be present in the submitted sample  additional confirmatory testing may be necessary for epidemiological  and / or clinical management purposes  to differentiate between  SARS-CoV-2 and other Sarbecovirus  currently known to infect humans.  If clinically indicated additional testing with an alternate test  methodology [phone]) is advised. The SARS-CoV-2 RNA is generally  detectable in upper and lower respiratory sp ecimens during the acute  phase of infection. The expected result is Negative. Fact Sheet for Patients:  StrictlyIdeas.no Fact Sheet for Healthcare Providers: BankingDealers.co.za This test is not yet approved or cleared by the [place] FDA and has been authorized for detection and/or diagnosis of SARS-CoV-2 by FDA under an Emergency Use Authorization (EUA).  This EUA will remain in effect (meaning this test can be used) for the duration of the COVID-19 declaration under Section 564(b)(1) of the Act, 21 U.S.C. section 360bbb-3(b)(1), unless the authorization is terminated or revoked sooner. Performed at [facility], [address] [place], [place] [address]   Culture, blood (routine x 2)     Status: None   Collection Time: 04/08/19  6:21 PM   Specimen: BLOOD  Result Value Ref Range Status   Specimen Description BLOOD RIGHT ANTECUBITAL  Final   Special Requests   Final    BOTTLES DRAWN AEROBIC AND ANAEROBIC Blood Culture adequate volume   Culture   Final    NO GROWTH 5 DAYS Performed at [facility], [address], [place], [place] [address]    Report Status 04/13/2019 FINAL  Final  Culture, blood (routine x 2)     Status: None   Collection Time: 04/08/19  6:22 PM   Specimen: BLOOD  Result Value Ref Range Status   Specimen Description BLOOD RIGHT ANTECUBITAL  Final   Special Requests   Final    BOTTLES DRAWN AEROBIC AND ANAEROBIC Blood Culture adequate volume   Culture   Final    NO GROWTH 5 DAYS Performed at [facility],  [address], [place], [place] [address]    Report Status 04/13/2019 FINAL  Final  MRSA PCR Screening     Status: None   Collection Time: 04/09/19  9:45 PM   Specimen: Nasopharyngeal  Result  Value Ref Range Status   MRSA by PCR NEGATIVE NEGATIVE Final    Comment:        The GeneXpert MRSA Assay (FDA approved for NASAL specimens only), is one component of a comprehensive MRSA colonization surveillance program. It is not intended to diagnose MRSA infection nor to guide or monitor treatment for MRSA infections. Performed at [facility], [address], [place], [place] [address]   Culture, sputum-assessment     Status: None   Collection Time: 04/13/19  9:30 AM   Specimen: Sputum  Result Value Ref Range Status   Specimen Description SPUTUM  Final   Special Requests NONE  Final   Sputum evaluation   Final    Sputum specimen not acceptable for testing.  Please recollect.   NOTIFIED L. HILTON,RN @1014   09/06/2020KAY  SPECIMEN TO BE RECOLLECTED Performed at [facility], [address] [place] [address], [place], [place] [address]    Report Status 04/13/2019 FINAL  Final  MRSA PCR Screening     Status: None   Collection Time: 04/14/19  2:38 PM   Specimen: Nasal Mucosa; Nasopharyngeal  Result Value Ref Range Status   MRSA by PCR NEGATIVE NEGATIVE Final    Comment:        The GeneXpert MRSA Assay (FDA approved for NASAL specimens only), is one component of a comprehensive MRSA colonization surveillance program. It is not intended to diagnose MRSA infection nor to guide or monitor treatment for MRSA infections. Performed at [facility], [address] [place],  [address]     Radiology Studies: Dg Chest [facility] 1 View  Result Date: 04/15/2019 CLINICAL DATA:  Shortness of breath. EXAM: PORTABLE CHEST 1 VIEW COMPARISON:  Radiographs of April 13, 2019. FINDINGS: Stable cardiomegaly. No pneumothorax or definite pleural effusion is noted. Stable bilateral lung opacities are noted concerning for edema or inflammation. Bony thorax is unremarkable. IMPRESSION: Stable bilateral lung opacities as described above. Electronically Signed   By: Marijo Conception M.D.   On: 04/15/2019 07:31     Scheduled Meds: . amoxicillin-clavulanate  1 tablet Oral Q24H  . amoxicillin-clavulanate  1 tablet Oral Once in dialysis  . [START ON 04/16/2019] amoxicillin-clavulanate  1 tablet Oral Once in dialysis  . budesonide (PULMICORT) nebulizer solution  0.5 mg Nebulization BID  . Chlorhexidine Gluconate Cloth  6 each Topical Daily  . Chlorhexidine Gluconate Cloth  6 each Topical Q0600  . cholecalciferol  2,000 Units Oral QPM  . doxercalciferol  8 mcg Intravenous Q M,W,F-HD  . epoetin (EPOGEN/PROCRIT) injection  2,800 Units Intravenous Q M,W,F-HD  . gabapentin  100 mg Oral BID  . ipratropium  0.5 mg Nebulization QID  . mouth rinse  15 mL Mouth Rinse BID  . methylPREDNISolone (SOLU-MEDROL) injection  40 mg Intravenous Q12H  . metoprolol tartrate  25 mg Oral BID  . pantoprazole  40 mg Oral Daily  . sevelamer carbonate  2,400 mg Oral TID WC  . warfarin  7.5 mg Oral Once  . Warfarin - Pharmacist Dosing Inpatient   Does not apply q1800   Continuous Infusions: . sodium chloride    . sodium chloride    . esmolol 25 mcg/kg/min (04/15/19 1035)     LOS: 5 days    Time spent: 35 minutes  Barton Dubois, MD [facility] Pager [phone]   04/15/2019, 10:44 AM

## 2019-04-15 NOTE — TOC Progression Note (Signed)
Transition of Care [facility]) - Progression Note    Patient Details  Name: Sabrina Beck MRN: [id number] Date of Birth: [birth year]  Transition of [facility]) CM/SW Contact  Boneta Lucks, RN Phone Number: 04/15/2019, 3:43 PM  Clinical Narrative:  Patient has high risk readmission score. Patient lives at home with her son.  Can driving, not driving at present time, Uses a cane. No problems with transportation goes to dialysis MWF at [facility] in [place].  Patient will have dialysis today and tomorrow then reassess the need for home oxygen. TOC to follow.     Expected Discharge Plan: [place] Barriers to Discharge: No Barriers Identified  Expected Discharge Plan and Services Expected Discharge Plan: [place]      Social Determinants of Health (SDOH) Interventions    Readmission Risk Interventions Readmission Risk Prevention Plan 04/15/2019 04/11/2019 04/10/2019  Transportation Screening Complete - Complete  PCP or Specialist Appt within 3-5 Days Complete Complete -  HRI or Home Care Consult Not Complete - -  Social Work Consult for [place] Planning/Counseling Complete - Complete  Palliative Care Screening Not Complete - Not Applicable  Medication Review [employer] photographer) Complete - Complete  Some recent data might be hidden

## 2019-04-15 NOTE — Progress Notes (Signed)
[facility] for Coumadin Indication: atrial fibrillation  No Known Allergies  Patient Measurements: Height: 5\' 4"  (162.6 cm) Weight: 215 lb 9.8 oz (97.8 kg) IBW/kg (Calculated) : 54.7  Vital Signs: Temp: 97.9 F (36.6 C) (09/08 0800) Temp Source: Oral (09/08 0800) BP: 160/87 (09/08 0500) Pulse Rate: 114 (09/08 0500)  Labs: Recent Labs    04/13/19 0409 04/13/19 0410 04/14/19 0419 04/14/19 0420 04/14/19 1730 04/15/19 0434  HGB  --   --   --   --  10.2* 10.2*  HCT  --   --   --   --  31.6* 31.9*  PLT  --   --   --   --  266 256  LABPROT 23.5*  --  27.1*  --   --  25.3*  INR 2.1*  --  2.6*  --   --  2.3*  CREATININE  --  7.28*  --  5.67*  --  4.87*  4.87*    Estimated Creatinine Clearance: 10.6 mL/min (A) (by C-G formula based on SCr of 4.87 mg/dL (H)).   Medical History: Past Medical History:  Diagnosis Date  . Anemia   . Atrial fibrillation ([place])   . Chest pain 05/15/2006   Stress test negative for ischemia  . Diastolic heart failure ([place])   . Diverticulosis   . ESRD on hemodialysis ([place])   . GERD (gastroesophageal reflux disease)   . Headache   . Mixed hyperlipidemia   . Peripheral neuropathy   . PONV (postoperative nausea and vomiting)   . Systemic hypertension   . Uterine cancer ([facility])     Medications:  Medications Prior to Admission  Medication Sig Dispense Refill Last Dose  . acetaminophen (TYLENOL) 500 MG tablet Take 1,000 mg by mouth every 6 (six) hours as needed for moderate pain.    Past Week at Unknown time  . Cholecalciferol (VITAMIN D) 2000 UNITS CAPS Take 2,000 Units by mouth every evening.    04/07/2019 at Unknown time  . gabapentin (NEURONTIN) 100 MG capsule Take 100 mg by mouth 2 (two) times daily.    04/08/2019 at Unknown time  . metoprolol tartrate (LOPRESSOR) 50 MG tablet Take 1 tablet (50 mg total) by mouth 2 (two) times daily. (Patient taking differently: Take 50 mg by mouth See admin instructions.  Take one tablet every day at bedtime. Take one tablet every day in the morning on days that are non-dialysis days (T-TH-S-S)) 60 tablet 6 04/08/2019 at [place]  . Multiple Vitamins-Minerals (PRESERVISION AREDS 2 PO) Take 1 tablet by mouth 2 (two) times daily.   04/08/2019 at Unknown time  . multivitamin (RENA-VIT) TABS tablet Take 1 tablet by mouth every evening.    04/07/2019 at Unknown time  . Omega-3 Fatty Acids (OMEGA-3 FISH OIL) 300 MG CAPS Take 300 mg by mouth 2 (two) times daily.   04/08/2019 at Unknown time  . omeprazole (PRILOSEC) 20 MG capsule Take 20 mg by mouth every morning.    04/08/2019 at Unknown time  . Propylene Glycol (SYSTANE BALANCE) 0.6 % SOLN Place 1 drop into both eyes 3 (three) times daily as needed (for burning or dry eyes).    Past Month at Unknown time  . RENVELA 800 MG tablet Take 2,400 mg by mouth 3 (three) times daily with meals. Takes 1 tablet with snacks   04/08/2019 at Unknown time  . warfarin (COUMADIN) 5 MG tablet Take 1.5 to 2 tablets by mouth daily as directed by coumadin  clinic (Patient taking differently: Take 7.5-10 mg by mouth See admin instructions. 10mg  (10mg  total) on Mondays, then take 7.5mg  (1.5 tablets) on all other days in the evening.) 150 tablet 1 04/07/2019 at 2000    Assessment: 78 y.o.female,with history of uterine cancer, hypertension, peripheral neuropathy, hyperlipidemia, GERD, ESRD on dialysis M-W-F, diastolic heart failure, atrial fibrillation, and anemia presents to the ED with shortness of breath. Patient had been getting progressively short of breath over 5 days. Patient chronically anticoagulated with coumadin. Pharmacy asked to monitor and dose coumadin.   Home dose is 10mg  on Monday and 7.5mg  all other days.  Goal of Therapy:  INR 2-3 Monitor platelets by anticoagulation protocol: Yes  Plan:  Coumadin 7.5mg  po x 1 today Daily PT-INR Monitor for s/s of bleeding  Margot Ables, PharmD Clinical Pharmacist 04/15/2019 8:28 AM

## 2019-04-15 NOTE — Consult Note (Signed)
Cardiology Consultation:   Patient ID: ASHAUNTE STANDLEY MRN: [id number]; DOB: 1941/04/02  Admit date: 04/08/2019 Date of Consult: 04/15/2019  Primary Care Provider: Redmond School, MD Primary Cardiologist: Sanda Klein, MD  Primary Electrophysiologist:  None    Patient Profile:   Sabrina Beck is a 78 y.o. female with a hx of  permanent atrial fib,  anticoagulation on coumadin,  HTN, ESRD on HD, EF 45% in Jan with acute illness,   who is being seen today for the evaluation of uncontrolled a fib RVR at the request of Dr. Dyann Kief.  History of Present Illness:   Sabrina Beck with above hx and in March decrease of her BB due to decrease in BP and fatigue.  Previously her dilt was stopped due to decrease in EF.  Chronic mixed heart failure and volume controlled with dialysis.  Pt admitted 04/08/2019 with DOE, no fevers.  Neg COVID.   + PNA. Admitted and placed on IV ABX.  A fib on admit was controlled.  Since then has been tachycardic.  She continues on coumadin.  Renal following mention of transfer to [facility] if CVVHD is needed.    EKG:  The EKG was personally reviewed and demonstrates:  Initial a fib at 98 LAD, rare PVC and Q wave in V1-V4 chronic, no ST changes.  On 04/13/19 A fib with RVR at 128 and LBBB. New tachycardia, no ST changes. Telemetry:  Telemetry was personally reviewed and demonstrates:  A fib RVR.   Troponin 22: 20 neg MI BNP 344 Today Na 140  K+ 4.1, Cr 4.87  WBC 10.2 Hgb 10.2 plts 256  INR 2.3   PCXR today COMPARISON:  Radiographs of April 13, 2019. FINDINGS: Stable cardiomegaly. No pneumothorax or definite pleural effusion is noted. Stable bilateral lung opacities are noted concerning for edema or inflammation. Bony thorax is unremarkable. IMPRESSION: Stable bilateral lung opacities as described above.  Currently BP 167/103 R 22 P 142 afebrile  Wt down from pk of 110.3 to 97.8 Kg if accurate was 101.5 Kg yesterday.  She denies chest pain.  Still with SOB.  Unaware of  rapid HR. On metoprolol 25 BID down from 50 BID at home.  Has rec'd lopressor 5 Mg IV X 5  Has rec'd 3 amiodarone boluses of 150 mg.      Per nurse these measures slow the rate for short period of time and then back up.  Dilt not used due to EF 45%.    Heart Pathway Score:     Past Medical History:  Diagnosis Date   Anemia    Atrial fibrillation ([place])    Chest pain 05/15/2006   Stress test negative for ischemia   Diastolic heart failure ([facility])    Diverticulosis    ESRD on hemodialysis ([facility])    GERD (gastroesophageal reflux disease)    Headache    Mixed hyperlipidemia    Peripheral neuropathy    PONV (postoperative nausea and vomiting)    Systemic hypertension    Uterine cancer ([place])     Past Surgical History:  Procedure Laterality Date   ABDOMINAL HYSTERECTOMY     APPENDECTOMY     AV FISTULA PLACEMENT Left 11/17/2013   Procedure: CREATION OF LEFT RADIOCEPHALIC ARTERIOVENOUS (AV) FISTULA ;  Surgeon: [name], MD;  Location: [facility] OR;  Service: Vascular;  Laterality: Left;   BREAST SURGERY Left    tumors non canerous 2- 3 different surgeries   CATARACT EXTRACTION W/PHACO Right 10/26/2016   Procedure: CATARACT  EXTRACTION PHACO AND INTRAOCULAR LENS PLACEMENT (IOC) CDE= 11.91;  Surgeon: Tonny Branch, MD;  Location: [facility];  Service: Ophthalmology;  Laterality: Right;  right - pt knows to arrive at 6:30   CATARACT EXTRACTION W/PHACO Left 11/30/2016   Procedure: CATARACT EXTRACTION PHACO AND INTRAOCULAR LENS PLACEMENT (IOC) CDE - 7.92 ;  Surgeon: Tonny Branch, MD;  Location: [facility];  Service: Ophthalmology;  Laterality: Left;  left   CHOLECYSTECTOMY     COLONOSCOPY W/ BIOPSIES AND POLYPECTOMY     FISTULA SUPERFICIALIZATION Left 05/19/2014   Procedure: FISTULA SUPERFICIALIZATION-LEFT ARM;  Surgeon: [name], MD;  Location: [facility];  Service: Vascular;  Laterality: Left;   INSERTION OF DIALYSIS CATHETER N/A 01/24/2014   Procedure: INSERTION OF DIALYSIS  CATHETER;  Surgeon: Rosetta Posner, MD;  Location: [facility] OR;  Service: Vascular;  Laterality: N/A;   KNEE ARTHROSCOPY     TUBAL LIGATION       Home Medications:  Prior to Admission medications   Medication Sig Start Date End Date Taking? Authorizing Provider  acetaminophen (TYLENOL) 500 MG tablet Take 1,000 mg by mouth every 6 (six) hours as needed for moderate pain.    Yes [provider]  Cholecalciferol (VITAMIN D) 2000 UNITS CAPS Take 2,000 Units by mouth every evening.    Yes [provider]  gabapentin (NEURONTIN) 100 MG capsule Take 100 mg by mouth 2 (two) times daily.  09/13/18  Yes [provider]  metoprolol tartrate (LOPRESSOR) 50 MG tablet Take 1 tablet (50 mg total) by mouth 2 (two) times daily. Patient taking differently: Take 50 mg by mouth See admin instructions. Take one tablet every day at bedtime. Take one tablet every day in the morning on days that are non-dialysis days (T-TH-S-S) 10/15/18  Yes Lendon Colonel, NP  Multiple Vitamins-Minerals (PRESERVISION AREDS 2 PO) Take 1 tablet by mouth 2 (two) times daily.   Yes [provider]  multivitamin (RENA-VIT) TABS tablet Take 1 tablet by mouth every evening.  07/06/18  Yes [provider]  Omega-3 Fatty Acids (OMEGA-3 FISH OIL) 300 MG CAPS Take 300 mg by mouth 2 (two) times daily.   Yes [provider]  omeprazole (PRILOSEC) 20 MG capsule Take 20 mg by mouth every morning.    Yes [provider]  Propylene Glycol (SYSTANE BALANCE) 0.6 % SOLN Place 1 drop into both eyes 3 (three) times daily as needed (for burning or dry eyes).    Yes [provider]  RENVELA 800 MG tablet Take 2,400 mg by mouth 3 (three) times daily with meals. Takes 1 tablet with snacks 04/17/15  Yes [provider]  warfarin (COUMADIN) 5 MG tablet Take 1.5 to 2 tablets by mouth daily as directed by coumadin clinic Patient taking differently: Take 7.5-10 mg by mouth See admin  instructions. 10mg  (10mg  total) on Mondays, then take 7.5mg  (1.5 tablets) on all other days in the evening. 10/15/18  Yes Lendon Colonel, NP    Inpatient Medications: Scheduled Meds:  amoxicillin-clavulanate  1 tablet Oral BID   budesonide (PULMICORT) nebulizer solution  0.5 mg Nebulization BID   Chlorhexidine Gluconate Cloth  6 each Topical Daily   Chlorhexidine Gluconate Cloth  6 each Topical Q0600   cholecalciferol  2,000 Units Oral QPM   doxercalciferol  8 mcg Intravenous Q M,W,F-HD   epoetin (EPOGEN/PROCRIT) injection  2,800 Units Intravenous Q M,W,F-HD   gabapentin  100 mg Oral BID   ipratropium  0.5 mg Nebulization QID  mouth rinse  15 mL Mouth Rinse BID   methylPREDNISolone (SOLU-MEDROL) injection  40 mg Intravenous Q12H   metoprolol tartrate  25 mg Oral BID   pantoprazole  40 mg Oral Daily   sevelamer carbonate  2,400 mg Oral TID WC   Warfarin - Pharmacist Dosing Inpatient   Does not apply q1800   Continuous Infusions:  sodium chloride     sodium chloride     PRN Meds: sodium chloride, sodium chloride, acetaminophen, albuterol, lidocaine (PF), lidocaine-prilocaine, metoprolol tartrate, midodrine, ondansetron (ZOFRAN) IV, pentafluoroprop-tetrafluoroeth  Allergies:   No Known Allergies  Social History:   Social History   Socioeconomic History   Marital status: Widowed    Spouse name: Not on file   Number of children: Not on file   Years of education: Not on file   Highest education level: Not on file  Occupational History   Not on file  Social Needs   Financial resource strain: Not on file   Food insecurity    Worry: Not on file    Inability: Not on file   Transportation needs    Medical: Not on file    Non-medical: Not on file  Tobacco Use   Smoking status: Former Smoker    Quit date: 08/08/1999    Years since quitting: 19.6   Smokeless tobacco: Never Used  Substance and Sexual Activity   Alcohol use: No    Alcohol/week:  0.0 standard drinks   Drug use: No   Sexual activity: Not Currently  Lifestyle   Physical activity    Days per week: Not on file    Minutes per session: Not on file   Stress: Not on file  Relationships   Social connections    Talks on phone: Not on file    Gets together: Not on file    Attends religious service: Not on file    Active member of club or organization: Not on file    Attends meetings of clubs or organizations: Not on file    Relationship status: Not on file   Intimate partner violence    Fear of current or ex partner: Not on file    Emotionally abused: Not on file    Physically abused: Not on file    Forced sexual activity: Not on file  Other Topics Concern   Not on file  Social History Narrative   Lives in a one story home.  One of her sons lives with her.  Has 2 sons.  Retired Oncologist.  Eduction: high school.    Family History:    Family History  Problem Relation Age of Onset   Diabetes Mother    Hypertension Mother    Diabetes Father    Heart disease Father        before age 47   Heart attack Father    Diabetes Sister    Hyperlipidemia Sister    Hypertension Sister    Cancer Brother    Diabetes Brother    Heart disease Brother    Hypertension Brother    Heart attack Brother      ROS:  Please see the history of present illness.  General:no colds or fevers, no weight changes Skin:no rashes or ulcers HEENT:no blurred vision, no congestion CV:see HPI PUL:see HPI GI:no diarrhea constipation or melena, no indigestion with hx of GERD GU:no hematuria, no dysuria Renal ESRD on HD MWF  MS:no joint pain, no claudication Neuro:no syncope, no lightheadedness Endo:no diabetes, no thyroid disease  All other ROS reviewed and negative.     Physical Exam/Data:   Vitals:   04/15/19 0200 04/15/19 0300 04/15/19 0400 04/15/19 0500  BP: (!) 130/91 (!) 138/93 (!) 157/91 (!) 160/87  Pulse: (!) 131 (!) 128 87 (!) 114  Resp: 20 (!)  22 (!) 21 (!) 24  Temp:   98.8 F (37.1 C)   TempSrc:   Oral   SpO2: 96% 96% 96% 95%  Weight:    97.8 kg  Height:        Intake/Output Summary (Last 24 hours) at 04/15/2019 0758 Last data filed at 04/14/2019 2000 Gross per 24 hour  Intake 328.16 ml  Output 2400 ml  Net -2071.84 ml   Last 3 Weights 04/15/2019 04/14/2019 04/14/2019  Weight (lbs) 215 lb 9.8 oz 223 lb 12.3 oz 223 lb 15.8 oz  Weight (kg) 97.8 kg 101.5 kg 101.6 kg     Body mass index is 37.01 kg/m.  General:  Well nourished, well developed, in no acute distress though dyspneic with talking HEENT: normal Lymph: no adenopathy Neck: no JVD, sitting up Endocrine:  No thryomegaly Vascular: No carotid bruits; pedal pulses 2+ bilaterally  Cardiac:  irreg irreg; no murmur  [place] or rub + rapid Lungs:  Decreased breath sounds in bases to auscultation bilaterally, no wheezing, rhonchi or rales  Abd: soft, nontender, no hepatomegaly  Ext: no edema Musculoskeletal:  No deformities, BUE and BLE strength normal and equal Skin: warm and dry  Neuro:  Alert and oriented X 3 MAE follows commands, no focal abnormalities noted Psych:  Normal affect    Relevant CV Studies: Echo 08/22/18 Study Conclusions  - Left ventricle: The cavity size was normal. Wall thickness was   increased in a pattern of moderate LVH. The estimated ejection   fraction was 45%. There is hypokinesis of the midinferoseptal   myocardium. The study was not technically sufficient to allow   evaluation of LV diastolic dysfunction due to atrial   fibrillation. - Aortic valve: Mildly calcified annulus. Trileaflet; mildly   calcified leaflets. - Mitral valve: Mildly calcified annulus. There was mild   regurgitation. - Left atrium: The atrium was moderately dilated. - Right atrium: The atrium was mildly to moderately dilated. - Atrial septum: No defect or patent foramen ovale was identified. - Tricuspid valve: There was mild regurgitation. - Pulmonary arteries: PA  peak pressure: 21 mm Hg (S). - Pericardium, extracardiac: There was no pericardial effusion.  ------------------------------------------------------------------- Study data:  Comparison was made to the study of 12/22/2013.  Study status:  Routine.  Procedure:  Transthoracic echocardiography. Image quality was adequate.  Study completion:  There were no complications.          Transthoracic echocardiography.  M-mode, complete 2D, spectral Doppler, and color Doppler.  Birthdate: Patient birthdate: [birth year].  Age:  Patient is 78 yr old.  Sex: Gender: female.    BMI: 36.7 kg/m^2.  Blood pressure:     116/79 Patient status:  Inpatient.  Study date:  Study date: 08/22/2018. Study time: 02:16 PM.  Location:  Bedside.  -------------------------------------------------------------------  ------------------------------------------------------------------- Left ventricle:  The cavity size was normal. Wall thickness was increased in a pattern of moderate LVH. The estimated ejection fraction was 45%.  Regional wall motion abnormalities:   There is hypokinesis of the midinferoseptal myocardium. The study was not technically sufficient to allow evaluation of LV diastolic dysfunction due to atrial fibrillation.  ------------------------------------------------------------------- Aortic valve:   Mildly calcified annulus. Trileaflet; mildly calcified leaflets.  Doppler:  There was no significant regurgitation.  ------------------------------------------------------------------- Aorta:  Aortic root: The aortic root was normal in size.  ------------------------------------------------------------------- Mitral valve:   Mildly calcified annulus.  Doppler:  There was mild regurgitation.    Mean gradient (D): 4 mm Hg. Peak gradient (D): 7 mm Hg.  ------------------------------------------------------------------- Left atrium:  The atrium was moderately  dilated.  ------------------------------------------------------------------- Atrial septum:  No defect or patent foramen ovale was identified.   ------------------------------------------------------------------- Right ventricle:  The cavity size was normal. Systolic function was normal.  ------------------------------------------------------------------- Pulmonic valve:    The valve appears to be grossly normal. Doppler:  There was no significant regurgitation.  ------------------------------------------------------------------- Tricuspid valve:   The valve appears to be grossly normal. Doppler:  There was mild regurgitation.  ------------------------------------------------------------------- Right atrium:  The atrium was mildly to moderately dilated.  ------------------------------------------------------------------- Pericardium:  There was no pericardial effusion.  ------------------------------------------------------------------- Systemic veins: Inferior vena cava: The vessel was normal in size. The respirophasic diameter changes were in the normal range (>= 50%), consistent with normal central venous pressure.   Laboratory Data:  High Sensitivity Troponin:   Recent Labs  Lab 04/08/19 1600 04/08/19 1822  TROPONINIHS 20* 22*     Chemistry Recent Labs  Lab 04/13/19 0410 04/14/19 0420 04/15/19 0434  NA 136 140 140  K 4.6 4.1 4.1  CL 97* 100 100  CO2 25 25 25   GLUCOSE 165* 128* 127*  BUN 52* 44* 38*  CREATININE 7.28* 5.67* 4.87*   4.87*  CALCIUM 10.3 10.6* 10.3  GFRNONAA 5* 7* 8*   8*  GFRAA 6* 8* 9*   9*  ANIONGAP 14 15 15     Recent Labs  Lab 04/09/19 0558  04/13/19 0410 04/14/19 0420 04/15/19 0434  PROT 6.1*  --   --   --   --   ALBUMIN 3.2*   < > 3.8 3.8 3.9  AST 13*  --   --   --   --   ALT 22  --   --   --   --   ALKPHOS 60  --   --   --   --   BILITOT 0.7  --   --   --   --    < > = values in this interval not displayed.    Hematology Recent Labs  Lab 04/11/19 0937 04/14/19 1730 04/15/19 0434  WBC 6.8 12.1* 10.2  RBC 2.62* 2.88* 2.91*  HGB 9.3* 10.2* 10.2*  HCT 28.8* 31.6* 31.9*  MCV 109.9* 109.7* 109.6*  MCH 35.5* 35.4* 35.1*  MCHC 32.3 32.3 32.0  RDW 13.3 13.3 13.2  PLT 154 266 256   BNP Recent Labs  Lab 04/08/19 1600  BNP 344.0*    DDimer No results for input(s): DDIMER in the last 168 hours.   Radiology/Studies:  Ct Head Wo Contrast  Result Date: 04/11/2019 CLINICAL DATA:  Transient ischemic attack.  Slurred speech. EXAM: CT HEAD WITHOUT CONTRAST TECHNIQUE: Contiguous axial images were obtained from the base of the skull through the vertex without intravenous contrast. COMPARISON:  CT scan of September 05, 2014. FINDINGS: Brain: No evidence of acute infarction, hemorrhage, hydrocephalus, extra-axial collection or mass lesion/mass effect. Vascular: No hyperdense vessel or unexpected calcification. Skull: Normal. Negative for fracture or focal lesion. Sinuses/Orbits: No acute finding. Other: None. IMPRESSION: No acute intracranial abnormality seen. Electronically Signed   By: Marijo Conception M.D.   On: 04/11/2019 12:08   Dg Chest Port 1 View  Result Date: 04/15/2019 CLINICAL DATA:  Shortness of breath. EXAM: PORTABLE CHEST 1 VIEW COMPARISON:  Radiographs of April 13, 2019. FINDINGS: Stable cardiomegaly. No pneumothorax or definite pleural effusion is noted. Stable bilateral lung opacities are noted concerning for edema or inflammation. Bony thorax is unremarkable. IMPRESSION: Stable bilateral lung opacities as described above. Electronically Signed   By: Marijo Conception M.D.   On: 04/15/2019 07:31   Dg Chest Port 1 View  Result Date: 04/13/2019 CLINICAL DATA:  Shortness of breath and hypoxia in a patient with a history of end-stage renal disease. EXAM: PORTABLE CHEST 1 VIEW COMPARISON:  Single-view of the chest 04/11/2019. PA and lateral chest 04/10/2019. FINDINGS: Bilateral airspace disease has  improved but there is persistent focal airspace opacity in the right mid and lower lung zones. There is cardiomegaly. No pneumothorax. Small bilateral pleural effusions. IMPRESSION: Persistent but improved bilateral airspace disease compatible with decreased pulmonary edema. More focal airspace opacity in the right midlung could be due to edema or pneumonia. Electronically Signed   By: Inge Rise M.D.   On: 04/13/2019 09:08   Dg Chest Port 1 View  Result Date: 04/11/2019 CLINICAL DATA:  Increasing shortness of breath following dialysis. EXAM: PORTABLE CHEST 1 VIEW COMPARISON:  04/10/2019. FINDINGS: Poor inspiration. Progressive enlargement of the cardiac silhouette with increased prominence of the pulmonary vasculature interstitial markings. Progressive bilateral airspace opacities, greater on the right. No definite pleural fluid seen. Unremarkable bones. IMPRESSION: 1. Progressive cardiomegaly and changes of congestive heart failure. 2. Progressive bilateral alveolar edema or pneumonia, greater on the right. Electronically Signed   By: Claudie Revering M.D.   On: 04/11/2019 19:54    Assessment and Plan:   1. Atrial fib with RVR on lopressor 25 BID at home was on 50 mg BID,  BP is elevated as well.  May need Increase to 50 mg BID but with soft BP with dialysis this is difficult. .  Dr. Harl Bowie to see.   Both IV BB and IV amiodarone slow rate. ? Amiodarone drip   2. anticoagulation on coumadin.  INR 2.3.   3. PNA per IM on antibiotics  4. ESRD on HD MWF.  Did have dialysis yesterday.  For dialysis today as well.  5. Chronic diastolic and systolic HF last EF in Jan was 45%.  Mod LVH. Possible recheck this admit.  6. HTN though soft BPs with dialysis.  Plan for midodrine today.   7. Anemia of chronic disease per IM and Renal.         For questions or updates, please contact [place] Please consult [email or web address] for contact info under     Signed, Cecilie Kicks, NP  04/15/2019 7:58 AM

## 2019-04-16 LAB — RENAL FUNCTION PANEL
Albumin: 3.7 g/dL (ref 3.5–5.0)
Anion gap: 13 (ref 5–15)
BUN: 38 mg/dL — ABNORMAL HIGH (ref 8–23)
CO2: 28 mmol/L (ref 22–32)
Calcium: 10.6 mg/dL — ABNORMAL HIGH (ref 8.9–10.3)
Chloride: 96 mmol/L — ABNORMAL LOW (ref 98–111)
Creatinine, Ser: 4.6 mg/dL — ABNORMAL HIGH (ref 0.44–1.00)
GFR calc Af Amer: 10 mL/min — ABNORMAL LOW (ref >60)
GFR calc non Af Amer: 8 mL/min — ABNORMAL LOW (ref >60)
Glucose, Bld: 131 mg/dL — ABNORMAL HIGH (ref 70–99)
Phosphorus: 5.2 mg/dL — ABNORMAL HIGH (ref 2.5–4.6)
Potassium: 4 mmol/L (ref 3.5–5.1)
Sodium: 137 mmol/L (ref 135–145)

## 2019-04-16 LAB — CBC
HCT: 33.2 % — ABNORMAL LOW (ref 36.0–46.0)
Hemoglobin: 10.6 g/dL — ABNORMAL LOW (ref 12.0–15.0)
MCH: 35.2 pg — ABNORMAL HIGH (ref 26.0–34.0)
MCHC: 31.9 g/dL (ref 30.0–36.0)
MCV: 110.3 fL — ABNORMAL HIGH (ref 80.0–100.0)
Platelets: 277 10*3/uL (ref 150–400)
RBC: 3.01 MIL/uL — ABNORMAL LOW (ref 3.87–5.11)
RDW: 13.2 % (ref 11.5–15.5)
WBC: 10.9 10*3/uL — ABNORMAL HIGH (ref 4.0–10.5)
nRBC: 0 % (ref 0.0–0.2)

## 2019-04-16 LAB — PROTIME-INR
INR: 2.2 — ABNORMAL HIGH (ref 0.8–1.2)
Prothrombin Time: 24.3 seconds — ABNORMAL HIGH (ref 11.4–15.2)

## 2019-04-16 LAB — PROCALCITONIN: Procalcitonin: 1.62 ng/mL

## 2019-04-16 MED ORDER — IPRATROPIUM BROMIDE 0.02 % IN SOLN
0.5000 mg | Freq: Four times a day (QID) | RESPIRATORY_TRACT | Status: DC
  Administered 2019-04-16 (×2): 0.5 mg via RESPIRATORY_TRACT
  Filled 2019-04-16 (×2): qty 2.5

## 2019-04-16 MED ORDER — LEVALBUTEROL HCL 1.25 MG/0.5ML IN NEBU
1.2500 mg | INHALATION_SOLUTION | Freq: Three times a day (TID) | RESPIRATORY_TRACT | Status: DC
  Administered 2019-04-17 – 2019-04-25 (×25): 1.25 mg via RESPIRATORY_TRACT
  Filled 2019-04-16 (×26): qty 0.5

## 2019-04-16 MED ORDER — LEVALBUTEROL HCL 1.25 MG/0.5ML IN NEBU
1.2500 mg | INHALATION_SOLUTION | Freq: Four times a day (QID) | RESPIRATORY_TRACT | Status: DC
  Administered 2019-04-16 (×2): 1.25 mg via RESPIRATORY_TRACT
  Filled 2019-04-16 (×2): qty 0.5

## 2019-04-16 MED ORDER — IPRATROPIUM BROMIDE 0.02 % IN SOLN
0.5000 mg | Freq: Three times a day (TID) | RESPIRATORY_TRACT | Status: DC
  Administered 2019-04-17 – 2019-04-25 (×25): 0.5 mg via RESPIRATORY_TRACT
  Filled 2019-04-16 (×26): qty 2.5

## 2019-04-16 MED ORDER — WARFARIN SODIUM 7.5 MG PO TABS
7.5000 mg | ORAL_TABLET | Freq: Once | ORAL | Status: AC
  Administered 2019-04-16: 7.5 mg via ORAL
  Filled 2019-04-16: qty 1

## 2019-04-16 MED ORDER — MIDODRINE HCL 5 MG PO TABS
5.0000 mg | ORAL_TABLET | ORAL | Status: DC
  Administered 2019-04-16 – 2019-04-21 (×3): 5 mg via ORAL
  Filled 2019-04-16 (×3): qty 1

## 2019-04-16 MED ORDER — LEVALBUTEROL HCL 1.25 MG/0.5ML IN NEBU: 1.2500 mg | INHALATION_SOLUTION | Freq: Four times a day (QID) | RESPIRATORY_TRACT | Status: DC

## 2019-04-16 NOTE — Procedures (Signed)
    HEMODIALYSIS TREATMENT NOTE:  3.5 hour HD session completed via left forearm AVF (15g/antegrade). Goal NOT met: Unable to remove 2L as ordered.  UF was repeatedly interrupted in response to SBP<90.  Midodrine 44m given pre-HD, dialysate cooled, SVS modeling enabled.  All blood ws returned. 1022cc removed. BP dropped to 76/40 after arterial needle removal and an additional 300cc NS bolus was given. Pt was asymptomatic.  Net UF 822cc  BP 15 minutes after last fluid bolus was 104/55.  ARockwell Alexandria RN

## 2019-04-16 NOTE — Progress Notes (Signed)
PROGRESS NOTE  Sabrina Beck OIT:[id number] DOB: 1940-10-02 DOA: 04/08/2019 PCP: Redmond School, MD  Brief History:  78 y.o.femalewith medical history significant ofend-stage renal disease, permanent atrial fibrillation on chronic anticoagulation with Coumadin, chronic systolic and diastolic CHF, hypertension, GERD, presents to the hospital with chief complaint of significant shortness of breath x 5 days.  She reports that it felt like she just could not take a deep breath in. Patient denies any chest pain, coughing, fever, dizziness, n/v/d.  ED course Chemistry shows a glucose of 140, BUN of 45, creatinine of 9.98, and GFR of 3. Patient has a BNP of 344. Initial troponin is 20. Hematology reveals no leukocytosis, but a macrocytic anemia with a hemoglobin of 11. Chest x-ray was done that shows enlargement of cardiac silhouette. Right lower lobe consolidation consistent with pneumonia. Blood cultures drawn. Lactic acid 1.6. Troponin stable at 22. Second lactic acid 1.3.  Assessment/Plan: Acute hypoxic respiratory failure with hypoxia due tolobar pneumoniaas well as pulmonary edema -Patient was admitted to stepdown and placed on supplemental oxygen.  -Nephrology was consulted and underwent dialysis  -Wheezing has improved but stillthere, she is breathing more comfortable -Continuepulmicort to 0.5 mg bid during hospitalization -pt endorsed some indiscretion with fluidsat home -1/16--Echo--EF 45%, mid inferior septal HK, PASP 21, mild TR/MR -finished 8 days abx during hospitalization  chronic systolic and diastolic CHF -Given the decrease in EF, discontinued diltiazem in Jan 2020 -08/22/18--Echo--EF 45%, mid inferior septal HK, PASP 21, mild TR/MR -continue metoprolol tartrate 25 mg bid -wt down from 106 to 96.5 Kg.   ESRD -last HD 04/15/19--UF only -appreciate nephrology -planning HD today -midodrine on HD to help maintain BP  Bronchospasm -Continue  Pulmicort to 0.5 mg bid -Continueto wean prednisone -addedatrovent neb -improved -continue atrovent -add xopenex  Atrial fibrillation, permanent, with RVR -This is likely in the setting of fluid overload, -she was placed on esmolol infusion initially--continue -rateworse due to acute hypoxia and fluid overload. -Continue Coumadin per pharmacy -continue metoprolol -Appreciate cardiology consultation  -anticipate HR will continue to improve with improvement of her acute illness  Hypercalcemia -Hectorol on hold -per nephrology  Hypertension -Blood pressure remained stable/soft  Anemia of end-stage renal disease -Hemoglobin stable, no bleeding -continue ESA  GERD -Continue home PPI      Disposition Plan:   Home 1-2 days when HR better controlled Family Communication:   Family at bedside  Consultants: renal, cardiology   Code Status:  FULL   DVT Prophylaxis:  coumadin   Procedures: As Listed in Progress Note Above  Antibiotics: vanc 9/2 Ceftriaxone 9/1 Cefepime 9/2>>>9/5 Amox/clav 9/6>>>       Subjective: Patient denies fevers, chills, headache, chest pain, dyspnea, nausea, vomiting, diarrhea, abdominal pain, dysuria, hematuria, hematochezia, and melena.   Objective: Vitals:   04/16/19 0600 04/16/19 0700 04/16/19 0855 04/16/19 0900  BP: 100/72 91/65  109/78  Pulse: (!) 136 (!) 108  (!) 105  Resp: 18 18  14   Temp:  97.8 F (36.6 C)    TempSrc:  Axillary    SpO2: 96% 96% 97% 99%  Weight:      Height:        Intake/Output Summary (Last 24 hours) at 04/16/2019 1056 Last data filed at 04/15/2019 1630 Gross per 24 hour  Intake 65.92 ml  Output 2528 ml  Net -2462.08 ml   Weight change: -8.15 kg Exam:   General:  Pt is alert, follows commands appropriately, not in acute  distress  HEENT: No icterus, No thrush, No neck mass, [place]/AT  Cardiovascular: RRR, S1/S2, no rubs, no gallops  Respiratory: bibasilar rales. No wheeze  Abdomen:  Soft/+BS, non tender, non distended, no guarding  Extremities: trace LE edema, No lymphangitis, No petechiae, No rashes, no synovitis   Data Reviewed: I have personally reviewed following labs and imaging studies Basic Metabolic Panel: Recent Labs  Lab 04/12/19 0348 04/13/19 0410 04/14/19 0420 04/15/19 0434 04/16/19 0434  NA 136 136 140 140 137  K 4.2 4.6 4.1 4.1 4.0  CL 95* 97* 100 100 96*  CO2 28 25 25 25 28   GLUCOSE 154* 165* 128* 127* 131*  BUN 25* 52* 44* 38* 38*  CREATININE 5.08* 7.28* 5.67* 4.87*  4.87* 4.60*  CALCIUM 9.8 10.3 10.6* 10.3 10.6*  PHOS 6.1* 5.5* 5.2* 4.8* 5.2*   Liver Function Tests: Recent Labs  Lab 04/12/19 0348 04/13/19 0410 04/14/19 0420 04/15/19 0434 04/16/19 0434  ALBUMIN 4.0 3.8 3.8 3.9 3.7   No results for input(s): LIPASE, AMYLASE in the last 168 hours. No results for input(s): AMMONIA in the last 168 hours. Coagulation Profile: Recent Labs  Lab 04/12/19 0348 04/13/19 0409 04/14/19 0419 04/15/19 0434 04/16/19 0434  INR 1.7* 2.1* 2.6* 2.3* 2.2*   CBC: Recent Labs  Lab 04/10/19 0437 04/11/19 0937 04/14/19 1730 04/15/19 0434 04/16/19 0434  WBC 6.5 6.8 12.1* 10.2 10.9*  HGB 8.9* 9.3* 10.2* 10.2* 10.6*  HCT 27.9* 28.8* 31.6* 31.9* 33.2*  MCV 111.2* 109.9* 109.7* 109.6* 110.3*  PLT 130* 154 266 256 277   Cardiac Enzymes: No results for input(s): CKTOTAL, CKMB, CKMBINDEX, TROPONINI in the last 168 hours. BNP: Invalid input(s): POCBNP CBG: Recent Labs  Lab 04/11/19 1131  GLUCAP 101*   HbA1C: No results for input(s): HGBA1C in the last 72 hours. Urine analysis:    Component Value Date/Time   COLORURINE YELLOW 03/03/2012 1500   APPEARANCEUR CLEAR 03/03/2012 1500   LABSPEC 1.020 03/03/2012 1500   PHURINE 6.0 03/03/2012 1500   GLUCOSEU 100 (A) 03/03/2012 1500   HGBUR TRACE (A) 03/03/2012 1500   BILIRUBINUR NEGATIVE 03/03/2012 1500   KETONESUR NEGATIVE 03/03/2012 1500   PROTEINUR 100 (A) 03/03/2012 1500    UROBILINOGEN 0.2 03/03/2012 1500   NITRITE NEGATIVE 03/03/2012 1500   LEUKOCYTESUR NEGATIVE 03/03/2012 1500   Sepsis Labs: @LABRCNTIP (procalcitonin:4,lacticidven:4) ) Recent Results (from the past 240 hour(s))  SARS Coronavirus 2 [facility] order, Performed in [facility] hospital lab) Nasopharyngeal Nasopharyngeal Swab     Status: None   Collection Time: 04/08/19  5:50 PM   Specimen: Nasopharyngeal Swab  Result Value Ref Range Status   SARS Coronavirus 2 NEGATIVE NEGATIVE Final    Comment: (NOTE) If result is NEGATIVE SARS-CoV-2 target nucleic acids are NOT DETECTED. The SARS-CoV-2 RNA is generally detectable in upper and lower  respiratory specimens during the acute phase of infection. The lowest  concentration of SARS-CoV-2 viral copies this assay can detect is 250  copies / mL. A negative result does not preclude SARS-CoV-2 infection  and should not be used as the sole basis for treatment or other  patient management decisions.  A negative result may occur with  improper specimen collection / handling, submission of specimen other  than nasopharyngeal swab, presence of viral mutation(s) within the  areas targeted by this assay, and inadequate number of viral copies  (<250 copies / mL). A negative result must be combined with clinical  observations, patient history, and epidemiological information. If result is POSITIVE SARS-CoV-2 target  nucleic acids are DETECTED. The SARS-CoV-2 RNA is generally detectable in upper and lower  respiratory specimens dur ing the acute phase of infection.  Positive  results are indicative of active infection with SARS-CoV-2.  Clinical  correlation with patient history and other diagnostic information is  necessary to determine patient infection status.  Positive results do  not rule out bacterial infection or co-infection with other viruses. If result is PRESUMPTIVE POSTIVE SARS-CoV-2 nucleic acids MAY BE PRESENT.   A presumptive positive result  was obtained on the submitted specimen  and confirmed on repeat testing.  While 2019 novel coronavirus  (SARS-CoV-2) nucleic acids may be present in the submitted sample  additional confirmatory testing may be necessary for epidemiological  and / or clinical management purposes  to differentiate between  SARS-CoV-2 and other Sarbecovirus currently known to infect humans.  If clinically indicated additional testing with an alternate test  methodology [id number]) is advised. The SARS-CoV-2 RNA is generally  detectable in upper and lower respiratory sp ecimens during the acute  phase of infection. The expected result is Negative. Fact Sheet for Patients:  StrictlyIdeas.no Fact Sheet for Healthcare Providers: BankingDealers.co.za This test is not yet approved or cleared by the [place] FDA and has been authorized for detection and/or diagnosis of SARS-CoV-2 by FDA under an Emergency Use Authorization (EUA).  This EUA will remain in effect (meaning this test can be used) for the duration of the COVID-19 declaration under Section 564(b)(1) of the Act, 21 U.S.C. section 360bbb-3(b)(1), unless the authorization is terminated or revoked sooner. Performed at [facility], [address] [place], [place] [address]   Culture, blood (routine x 2)     Status: None   Collection Time: 04/08/19  6:21 PM   Specimen: BLOOD  Result Value Ref Range Status   Specimen Description BLOOD RIGHT ANTECUBITAL  Final   Special Requests   Final    BOTTLES DRAWN AEROBIC AND ANAEROBIC Blood Culture adequate volume   Culture   Final    NO GROWTH 5 DAYS Performed at [facility], [address], [place], [place] [address]    Report Status 04/13/2019 FINAL  Final  Culture, blood (routine x 2)     Status: None   Collection Time: 04/08/19  6:22 PM   Specimen: BLOOD  Result Value Ref Range Status   Specimen Description BLOOD RIGHT ANTECUBITAL  Final   Special  Requests   Final    BOTTLES DRAWN AEROBIC AND ANAEROBIC Blood Culture adequate volume   Culture   Final    NO GROWTH 5 DAYS Performed at [facility], [address], [place], [place] [address]    Report Status 04/13/2019 FINAL  Final  MRSA PCR Screening     Status: None   Collection Time: 04/09/19  9:45 PM   Specimen: Nasopharyngeal  Result Value Ref Range Status   MRSA by PCR NEGATIVE NEGATIVE Final    Comment:        The GeneXpert MRSA Assay (FDA approved for NASAL specimens only), is one component of a comprehensive MRSA colonization surveillance program. It is not intended to diagnose MRSA infection nor to guide or monitor treatment for MRSA infections. Performed at [facility], [address], [place], [place] [address]   Culture, sputum-assessment     Status: None   Collection Time: 04/13/19  9:30 AM   Specimen: Sputum  Result Value Ref Range Status   Specimen Description SPUTUM  Final   Special Requests NONE  Final  Sputum evaluation   Final    Sputum specimen not acceptable for testing.  Please recollect.   NOTIFIED L. HILTON,RN @1014   09/06/2020KAY  SPECIMEN TO BE RECOLLECTED Performed at [facility], [address], [place], [place] [address]    Report Status 04/13/2019 FINAL  Final  MRSA PCR Screening     Status: None   Collection Time: 04/14/19  2:38 PM   Specimen: Nasal Mucosa; Nasopharyngeal  Result Value Ref Range Status   MRSA by PCR NEGATIVE NEGATIVE Final    Comment:        The GeneXpert MRSA Assay (FDA approved for NASAL specimens only), is one component of a comprehensive MRSA colonization surveillance program. It is not intended to diagnose MRSA infection nor to guide or monitor treatment for MRSA infections. Performed at [facility], [address], [place], [place] [address]      Scheduled Meds: . amoxicillin-clavulanate  1 tablet Oral Q24H  . amoxicillin-clavulanate  1 tablet Oral Once in dialysis  . budesonide (PULMICORT) nebulizer  solution  0.5 mg Nebulization BID  . Chlorhexidine Gluconate Cloth  6 each Topical Daily  . Chlorhexidine Gluconate Cloth  6 each Topical Q0600  . epoetin (EPOGEN/PROCRIT) injection  2,800 Units Intravenous Q M,W,F-HD  . gabapentin  100 mg Oral BID  . ipratropium  0.5 mg Nebulization QID  . mouth rinse  15 mL Mouth Rinse BID  . metoprolol tartrate  25 mg Oral BID  . midodrine  5 mg Oral Q M,W,F-HD  . pantoprazole  40 mg Oral Daily  . predniSONE  20 mg Oral Q breakfast  . sevelamer carbonate  2,400 mg Oral TID WC  . warfarin  7.5 mg Oral Once  . Warfarin - Pharmacist Dosing Inpatient   Does not apply q1800   Continuous Infusions: . sodium chloride    . sodium chloride    . esmolol 150 mcg/kg/min (04/16/19 1014)    Procedures/Studies: Dg Chest 2 View  Result Date: 04/10/2019 CLINICAL DATA:  Shortness of breath. EXAM: CHEST - 2 VIEW COMPARISON:  04/08/2019 and 08/18/2018 FINDINGS: Cardiac size is within normal limits. Aortic atherosclerosis. New pulmonary vascular congestion. New bilateral perihilar infiltrates. Persistent hazy infiltrate at the right lung base posteriorly. Minimal blunting of the posterior costophrenic angles. No acute bone abnormality. IMPRESSION: New bilateral perihilar infiltrates. Persistent infiltrate at the right lung base. This could represent pulmonary edema or multifocal progressive pneumonia. Aortic Atherosclerosis (ICD10-I70.0). Electronically Signed   By: Lorriane Shire M.D.   On: 04/10/2019 11:04   Ct Head Wo Contrast  Result Date: 04/11/2019 CLINICAL DATA:  Transient ischemic attack.  Slurred speech. EXAM: CT HEAD WITHOUT CONTRAST TECHNIQUE: Contiguous axial images were obtained from the base of the skull through the vertex without intravenous contrast. COMPARISON:  CT scan of September 05, 2014. FINDINGS: Brain: No evidence of acute infarction, hemorrhage, hydrocephalus, extra-axial collection or mass lesion/mass effect. Vascular: No hyperdense vessel or  unexpected calcification. Skull: Normal. Negative for fracture or focal lesion. Sinuses/Orbits: No acute finding. Other: None. IMPRESSION: No acute intracranial abnormality seen. Electronically Signed   By: Marijo Conception M.D.   On: 04/11/2019 12:08   Dg Chest Port 1 View  Result Date: 04/15/2019 CLINICAL DATA:  Shortness of breath. EXAM: PORTABLE CHEST 1 VIEW COMPARISON:  Radiographs of April 13, 2019. FINDINGS: Stable cardiomegaly. No pneumothorax or definite pleural effusion is noted. Stable bilateral lung opacities are noted concerning for edema or inflammation. Bony thorax is unremarkable. IMPRESSION: Stable bilateral lung opacities as  described above. Electronically Signed   By: Marijo Conception M.D.   On: 04/15/2019 07:31   Dg Chest Port 1 View  Result Date: 04/13/2019 CLINICAL DATA:  Shortness of breath and hypoxia in a patient with a history of end-stage renal disease. EXAM: PORTABLE CHEST 1 VIEW COMPARISON:  Single-view of the chest 04/11/2019. PA and lateral chest 04/10/2019. FINDINGS: Bilateral airspace disease has improved but there is persistent focal airspace opacity in the right mid and lower lung zones. There is cardiomegaly. No pneumothorax. Small bilateral pleural effusions. IMPRESSION: Persistent but improved bilateral airspace disease compatible with decreased pulmonary edema. More focal airspace opacity in the right midlung could be due to edema or pneumonia. Electronically Signed   By: Inge Rise M.D.   On: 04/13/2019 09:08   Dg Chest Port 1 View  Result Date: 04/11/2019 CLINICAL DATA:  Increasing shortness of breath following dialysis. EXAM: PORTABLE CHEST 1 VIEW COMPARISON:  04/10/2019. FINDINGS: Poor inspiration. Progressive enlargement of the cardiac silhouette with increased prominence of the pulmonary vasculature interstitial markings. Progressive bilateral airspace opacities, greater on the right. No definite pleural fluid seen. Unremarkable bones. IMPRESSION: 1.  Progressive cardiomegaly and changes of congestive heart failure. 2. Progressive bilateral alveolar edema or pneumonia, greater on the right. Electronically Signed   By: Claudie Revering M.D.   On: 04/11/2019 19:54   Dg Chest Port 1 View  Result Date: 04/08/2019 CLINICAL DATA:  Difficulty breathing for past few days, dyspnea on exertion, denies fever, history atrial fibrillation, heart failure, end-stage renal disease on dialysis, systemic hypertension, uterine cancer EXAM: PORTABLE CHEST 1 VIEW COMPARISON:  Portable exam 1645 hours compared to 08/18/2018 FINDINGS: Enlargement of cardiac silhouette. Mediastinal contours and pulmonary vascularity normal. Atherosclerotic calcification aorta. RIGHT lower lobe consolidation consistent with pneumonia. Remaining lungs clear. No pleural effusion or pneumothorax. Bones demineralized. IMPRESSION: Enlargement of cardiac silhouette. RIGHT lower lobe consolidation consistent with pneumonia. Electronically Signed   By: Lavonia Dana M.D.   On: 04/08/2019 17:18    Orson Eva, DO  [facility] Pager [phone]  If 7PM-7AM, please contact night-coverage [email or web address] Password [email or web address] 04/16/2019, 10:56 AM   LOS: 6 days

## 2019-04-16 NOTE — Progress Notes (Signed)
[facility] Progress Note    Assessment/ Plan:   1. Dyspnea on exertion- multifactorial with hx of fluid overload as well as PNA and a fib.  UF with HD as able as below. Will need PT/OT as well as home O2 eval if persists  2. ESRD- Normally MWF schedule at [facility] s/p HD on 9/6 for dyspnea/overload (3 kg net off) and had HD on 9/7 as well.  S/p UF only tx on 9/8.  - HD today, 9/9, per MWF schedule.  Next tx after today is planned for 9/11  - Midodrine prior to HD to help maintain BP and help with UF.  Reduced dose.  She is nearing euvolemic on my exam   - Note that she is charted as being below her outpatient dry weight.  Standing weights if possible   3. A fib with RVR - rate control per primary team and cardiology.  Spoke with cardiology 9/9 and they may titrate oral regimen.  They feel a fib multifactorial with PNA, steroids  4. Hypertension/volume - Does not have a history of large idwg per her primary Nephrology PA.  - s/p daily UF/HD as above.  Weigh standing up if able.    5. HCAP-  abx per primary svc.  on augmentin.   6. Anemiaof CKD- Cont with ESA and follow.   7. Secondary hyperparathyroidism- Hyperphos - on sevelamer.  Hectorol on hold for hypercalcemia as below  8. Hypercalcemia - hectorol is outpatient regimen as below.  Hectorol is on hold for now with hypercalcemia.  Check PTH to guide adjustment of dose.  Low calcium bath with HD.  Discontinued vitamin D (cholecalciferol) as well  9. Nutrition- would recommend renal diet    Subjective:   She underwent UF-only treatment on 9/8 with 2.5 kg off.  (Gave orders to nursing to perform UF only instead of HD.)  She is breathing better today per her assessment and nursing as well.  CXR with stable bilateral lung opacities read as concerning for edema or inflammation per radiology; no definite pleural effusion.  Spoke with cardiology - Dr. Harl Bowie today as above.  Review of systems:   Patient reports shortness of breath which is improving.  Denies chest pain.  Denies nausea or vomiting     Objective:   BP 91/65   Pulse (!) 108   Temp 98.6 F (37 C) (Oral)   Resp 18   Ht 5\' 4"  (1.626 m)   Wt 96.5 kg   SpO2 96%   BMI 36.52 kg/m   Intake/Output Summary (Last 24 hours) at 04/16/2019 0827 Last data filed at 04/15/2019 1630 Gross per 24 hour  Intake 65.92 ml  Output 2528 ml  Net -2462.08 ml   Weight change: -8.15 kg  Physical Exam: General elderly female in bed in no acute distress HEENT normocephalic atraumatic extraocular movements intact sclera anicteric Neck supple trachea midline Lungs breath sounds reduced at bases; unlabored at rest; no crackles or wheezing noted.  On oxygen at 2liters per nasal cannula Heart tachycardic and irregular; no rub; HR 110 on exam  Abdomen soft nontender obese body habitus  Extremities no edema appreciated Psych normal mood and affect Neuro - alert and oriented and provides history Access: LUE AVF bruit and thrill +  Imaging: Dg Chest Port 1 View  Result Date: 04/15/2019 CLINICAL DATA:  Shortness of breath. EXAM: PORTABLE CHEST 1 VIEW COMPARISON:  Radiographs of April 13, 2019. FINDINGS: Stable cardiomegaly. No pneumothorax or definite pleural effusion  is noted. Stable bilateral lung opacities are noted concerning for edema or inflammation. Bony thorax is unremarkable. IMPRESSION: Stable bilateral lung opacities as described above. Electronically Signed   By: Marijo Conception M.D.   On: 04/15/2019 07:31    Labs: BMET Recent Labs  Lab 04/10/19 0437 04/11/19 2637 04/12/19 0348 04/13/19 0410 04/14/19 0420 04/15/19 0434 04/16/19 0434  NA 137 135 136 136 140 140 137  K 3.7 5.0 4.2 4.6 4.1 4.1 4.0  CL 97* 96* 95* 97* 100 100 96*  CO2 30 26 28 25 25 25 28   GLUCOSE 117* 123* 154* 165* 128* 127* 131*  BUN 24* 46* 25* 52* 44* 38* 38*  CREATININE 6.04* 9.25* 5.08* 7.28* 5.67* 4.87*  4.87* 4.60*  CALCIUM 9.5 9.6 9.8 10.3  10.6* 10.3 10.6*  PHOS 4.3 7.5* 6.1* 5.5* 5.2* 4.8* 5.2*   CBC Recent Labs  Lab 04/11/19 0937 04/14/19 1730 04/15/19 0434 04/16/19 0434  WBC 6.8 12.1* 10.2 10.9*  HGB 9.3* 10.2* 10.2* 10.6*  HCT 28.8* 31.6* 31.9* 33.2*  MCV 109.9* 109.7* 109.6* 110.3*  PLT 154 266 256 277    Medications:    . amoxicillin-clavulanate  1 tablet Oral Q24H  . amoxicillin-clavulanate  1 tablet Oral Once in dialysis  . budesonide (PULMICORT) nebulizer solution  0.5 mg Nebulization BID  . Chlorhexidine Gluconate Cloth  6 each Topical Daily  . Chlorhexidine Gluconate Cloth  6 each Topical Q0600  . epoetin (EPOGEN/PROCRIT) injection  2,800 Units Intravenous Q M,W,F-HD  . gabapentin  100 mg Oral BID  . ipratropium  0.5 mg Nebulization QID  . mouth rinse  15 mL Mouth Rinse BID  . metoprolol tartrate  25 mg Oral BID  . pantoprazole  40 mg Oral Daily  . predniSONE  20 mg Oral Q breakfast  . sevelamer carbonate  2,400 mg Oral TID WC  . Warfarin - Pharmacist Dosing Inpatient   Does not apply q1800    [facility]: [id number] Bath 2K/2.5CaTime 3.5 hrsHeparin none. AccessLAVFBFR 400DFR 600  Hectoral 9.24mcg IV/HD Epogen 2,800Units IV/HD   Claudia Desanctis 04/16/2019, 8:27 AM

## 2019-04-16 NOTE — Progress Notes (Addendum)
Progress Note  Patient Name: Sabrina Beck Date of Encounter: 04/16/2019  Primary Cardiologist: Sanda Klein, MD   Subjective   No chest pain and no SOB feels weak, just eating BK is fatiguing    Inpatient Medications    Scheduled Meds: . amoxicillin-clavulanate  1 tablet Oral Q24H  . amoxicillin-clavulanate  1 tablet Oral Once in dialysis  . budesonide (PULMICORT) nebulizer solution  0.5 mg Nebulization BID  . Chlorhexidine Gluconate Cloth  6 each Topical Daily  . Chlorhexidine Gluconate Cloth  6 each Topical Q0600  . cholecalciferol  2,000 Units Oral QPM  . doxercalciferol  8 mcg Intravenous Q M,W,F-HD  . epoetin (EPOGEN/PROCRIT) injection  2,800 Units Intravenous Q M,W,F-HD  . gabapentin  100 mg Oral BID  . ipratropium  0.5 mg Nebulization QID  . mouth rinse  15 mL Mouth Rinse BID  . metoprolol tartrate  25 mg Oral BID  . pantoprazole  40 mg Oral Daily  . predniSONE  20 mg Oral Q breakfast  . sevelamer carbonate  2,400 mg Oral TID WC  . Warfarin - Pharmacist Dosing Inpatient   Does not apply q1800   Continuous Infusions: . sodium chloride    . sodium chloride    . esmolol 149.893 mcg/kg/min (04/16/19 0743)   PRN Meds: sodium chloride, sodium chloride, acetaminophen, albuterol, lidocaine (PF), lidocaine-prilocaine, midodrine, ondansetron (ZOFRAN) IV, pentafluoroprop-tetrafluoroeth   Vital Signs    Vitals:   04/16/19 0400 04/16/19 0500 04/16/19 0600 04/16/19 0700  BP: 105/71 103/67 100/72 91/65  Pulse: (!) 120 (!) 107 (!) 136 (!) 108  Resp: 19 20 18 18   Temp: 98.6 F (37 C)     TempSrc: Oral     SpO2: 96% 96% 96% 96%  Weight:      Height:        Intake/Output Summary (Last 24 hours) at 04/16/2019 0758 Last data filed at 04/15/2019 1630 Gross per 24 hour  Intake 65.92 ml  Output 2528 ml  Net -2462.08 ml   Last 3 Weights 04/15/2019 04/15/2019 04/15/2019  Weight (lbs) 212 lb 11.9 oz 205 lb 14.6 oz 205 lb 12.8 oz  Weight (kg) 96.5 kg 93.4 kg 93.35 kg       Telemetry    A fib RVR with any moviemnt u pto 137 other times 115 to 120 - Personally Reviewed  ECG    No new - Personally Reviewed  Physical Exam   GEN: No acute distress.   Neck: No JVD Cardiac: irreg irreg, no murmurs, rubs, or gallops.  Respiratory: Clear to auscultation bilaterally. Improved occ rhonchi GI: Soft, nontender, non-distended  MS: No edema; No deformity. Neuro:  Nonfocal  Psych: Normal affect   Labs    High Sensitivity Troponin:   Recent Labs  Lab 04/08/19 1600 04/08/19 1822  TROPONINIHS 20* 22*      Chemistry Recent Labs  Lab 04/14/19 0420 04/15/19 0434 04/16/19 0434  NA 140 140 137  K 4.1 4.1 4.0  CL 100 100 96*  CO2 25 25 28   GLUCOSE 128* 127* 131*  BUN 44* 38* 38*  CREATININE 5.67* 4.87*  4.87* 4.60*  CALCIUM 10.6* 10.3 10.6*  ALBUMIN 3.8 3.9 3.7  GFRNONAA 7* 8*  8* 8*  GFRAA 8* 9*  9* 10*  ANIONGAP 15 15 13      Hematology Recent Labs  Lab 04/14/19 1730 04/15/19 0434 04/16/19 0434  WBC 12.1* 10.2 10.9*  RBC 2.88* 2.91* 3.01*  HGB 10.2* 10.2* 10.6*  HCT 31.6* 31.9*  33.2*  MCV 109.7* 109.6* 110.3*  MCH 35.4* 35.1* 35.2*  MCHC 32.3 32.0 31.9  RDW 13.3 13.2 13.2  PLT 266 256 277    BNPNo results for input(s): BNP, PROBNP in the last 168 hours.   DDimer No results for input(s): DDIMER in the last 168 hours.   Radiology    Dg Chest Port 1 View  Result Date: 04/15/2019 CLINICAL DATA:  Shortness of breath. EXAM: PORTABLE CHEST 1 VIEW COMPARISON:  Radiographs of April 13, 2019. FINDINGS: Stable cardiomegaly. No pneumothorax or definite pleural effusion is noted. Stable bilateral lung opacities are noted concerning for edema or inflammation. Bony thorax is unremarkable. IMPRESSION: Stable bilateral lung opacities as described above. Electronically Signed   By: Marijo Conception M.D.   On: 04/15/2019 07:31    Cardiac Studies   Echo 08/22/18  Study Conclusions  - Left ventricle: The cavity size was normal. Wall thickness  was   increased in a pattern of moderate LVH. The estimated ejection   fraction was 45%. There is hypokinesis of the midinferoseptal   myocardium. The study was not technically sufficient to allow   evaluation of LV diastolic dysfunction due to atrial   fibrillation. - Aortic valve: Mildly calcified annulus. Trileaflet; mildly   calcified leaflets. - Mitral valve: Mildly calcified annulus. There was mild   regurgitation. - Left atrium: The atrium was moderately dilated. - Right atrium: The atrium was mildly to moderately dilated. - Atrial septum: No defect or patent foramen ovale was identified. - Tricuspid valve: There was mild regurgitation. - Pulmonary arteries: PA peak pressure: 21 mm Hg (S). - Pericardium, extracardiac: There was no pericardial effusion.   Patient Profile     78 y.o. female with a hx of  permanent atrial fib,  anticoagulation on coumadin,  HTN, ESRD on HD, EF 45% in Jan with acute illness, now admitted with PNA and A fib RVR.   Assessment & Plan    1. Atrial fib with RVR on lopressor 25 BID at home was on 50 mg BID,  BP is elevated as well.  May need Increase to 50 mg BID but with soft BP with dialysis this is difficult. .  Dr. Harl Bowie to see.   .   placed on esmolol. HR still 108 to 136 in her permanent a fib.  2. anticoagulation on coumadin.  INR 2.2.   3. PNA per IM on antibiotics  4. ESRD on HD MWF.  Did have dialysis yesterday.    5. Chronic diastolic and systolic HF last EF in Jan was 45%.  Mod LVH. Possible recheck this admit.  Neg 3779 and wt down from 106 to 96.5 Kg.   6. HTN though soft BPs with dialysis.  Plan for midodrine prior to dialysis.   now BP 117/80 to 91/65 7. Anemia of chronic disease per IM and Renal.   Hgb 10.6       For questions or updates, please contact [place] Please consult [email or web address] for contact info under        Signed, Cecilie Kicks, NP  04/16/2019, 7:58 AM    Attending note  Patient seen and discussed with PA  Dorene Ar, I agree with her documentation above. Permanent afib historically difficult to manage given low bp's during HD sessions. This admission her afib rates have been elevated/exacerbated by pneumonia/HF/steroids/bronchodilators. In setting of elevated rates and significant hypotension during HD this admission we started esmolol yesterday in hopes that b1 selectivity and short half life would be  favorable given her issues with HD and hypotension. Would avoid digoxin in setting of ESRD and elderly patient.  SHe is on esmolol at 151mcg/kg/min, remains on oral lopressor 25mg  bid. We suspect that as her acute systemic illnesses improve the drive for tachycardia will also improve and return to her prior baseline. Was able to have 2.5L taken off yesterday with HD, transient SBP <100 but otherwise tolerated, rates were 120s-140s. Plans for HD again today, she is nearing euvolemia. Continue esmolol today, I think we are going to have to tolerate some higher rates today to avoid significant hypotension with HD, likely titrate up her oral lopressor tomorrow since no HD planned tomorrow. Would repeat echo once better rate controlled.    Zandra Abts MD

## 2019-04-16 NOTE — Progress Notes (Signed)
ANTICOAGULATION CONSULT NOTE   Pharmacy Consult for Coumadin Indication: atrial fibrillation  No Known Allergies  Patient Measurements: Height: 5\' 4"  (162.6 cm) Weight: 212 lb 11.9 oz (96.5 kg) IBW/kg (Calculated) : 54.7  Vital Signs: Temp: 97.8 F (36.6 C) (09/09 0700) Temp Source: Axillary (09/09 0700) BP: 91/65 (09/09 0700) Pulse Rate: 108 (09/09 0700)  Labs: Recent Labs    04/14/19 0419 04/14/19 0420  04/14/19 1730 04/15/19 0434 04/16/19 0434  HGB  --   --    < > 10.2* 10.2* 10.6*  HCT  --   --   --  31.6* 31.9* 33.2*  PLT  --   --   --  266 256 277  LABPROT 27.1*  --   --   --  25.3* 24.3*  INR 2.6*  --   --   --  2.3* 2.2*  CREATININE  --  5.67*  --   --  4.87*  4.87* 4.60*   < > = values in this interval not displayed.    Estimated Creatinine Clearance: 11.2 mL/min (A) (by C-G formula based on SCr of 4.6 mg/dL (H)).   Medical History: Past Medical History:  Diagnosis Date  . Anemia   . Atrial fibrillation ([place])   . Chest pain 05/15/2006   Stress test negative for ischemia  . Diastolic heart failure ([employer])   . Diverticulosis   . ESRD on hemodialysis ([place])   . GERD (gastroesophageal reflux disease)   . Headache   . Mixed hyperlipidemia   . Peripheral neuropathy   . PONV (postoperative nausea and vomiting)   . Systemic hypertension   . Uterine cancer ([facility])     Medications:  Medications Prior to Admission  Medication Sig Dispense Refill Last Dose  . acetaminophen (TYLENOL) 500 MG tablet Take 1,000 mg by mouth every 6 (six) hours as needed for moderate pain.    Past Week at Unknown time  . Cholecalciferol (VITAMIN D) 2000 UNITS CAPS Take 2,000 Units by mouth every evening.    04/07/2019 at Unknown time  . gabapentin (NEURONTIN) 100 MG capsule Take 100 mg by mouth 2 (two) times daily.    04/08/2019 at Unknown time  . metoprolol tartrate (LOPRESSOR) 50 MG tablet Take 1 tablet (50 mg total) by mouth 2 (two) times daily. (Patient taking differently: Take 50  mg by mouth See admin instructions. Take one tablet every day at bedtime. Take one tablet every day in the morning on days that are non-dialysis days (T-TH-S-S)) 60 tablet 6 04/08/2019 at [place]  . Multiple Vitamins-Minerals (PRESERVISION AREDS 2 PO) Take 1 tablet by mouth 2 (two) times daily.   04/08/2019 at Unknown time  . multivitamin (RENA-VIT) TABS tablet Take 1 tablet by mouth every evening.    04/07/2019 at Unknown time  . Omega-3 Fatty Acids (OMEGA-3 FISH OIL) 300 MG CAPS Take 300 mg by mouth 2 (two) times daily.   04/08/2019 at Unknown time  . omeprazole (PRILOSEC) 20 MG capsule Take 20 mg by mouth every morning.    04/08/2019 at Unknown time  . Propylene Glycol (SYSTANE BALANCE) 0.6 % SOLN Place 1 drop into both eyes 3 (three) times daily as needed (for burning or dry eyes).    Past Month at Unknown time  . RENVELA 800 MG tablet Take 2,400 mg by mouth 3 (three) times daily with meals. Takes 1 tablet with snacks   04/08/2019 at Unknown time  . warfarin (COUMADIN) 5 MG tablet Take 1.5 to 2 tablets by mouth  daily as directed by coumadin clinic (Patient taking differently: Take 7.5-10 mg by mouth See admin instructions. 10mg  (10mg  total) on Mondays, then take 7.5mg  (1.5 tablets) on all other days in the evening.) 150 tablet 1 04/07/2019 at 2000    Assessment: 78 y.o.female,with history of uterine cancer, hypertension, peripheral neuropathy, hyperlipidemia, GERD, ESRD on dialysis M-W-F, diastolic heart failure, atrial fibrillation, and anemia presents to the ED with shortness of breath. Patient had been getting progressively short of breath over 5 days. Patient chronically anticoagulated with coumadin. Pharmacy asked to monitor and dose coumadin.   Home dose is 10mg  on Monday and 7.5mg  all other days.  INR 2.2  Goal of Therapy:  INR 2-3 Monitor platelets by anticoagulation protocol: Yes  Plan:  Coumadin 7.5mg  po x 1 today Daily PT-INR Monitor for s/s of bleeding  Margot Ables, PharmD Clinical  Pharmacist 04/16/2019 9:14 AM

## 2019-04-17 LAB — RENAL FUNCTION PANEL
Albumin: 3.6 g/dL (ref 3.5–5.0)
Anion gap: 12 (ref 5–15)
BUN: 32 mg/dL — ABNORMAL HIGH (ref 8–23)
CO2: 27 mmol/L (ref 22–32)
Calcium: 9.8 mg/dL (ref 8.9–10.3)
Chloride: 98 mmol/L (ref 98–111)
Creatinine, Ser: 4.11 mg/dL — ABNORMAL HIGH (ref 0.44–1.00)
GFR calc Af Amer: 11 mL/min — ABNORMAL LOW (ref >60)
GFR calc non Af Amer: 10 mL/min — ABNORMAL LOW (ref >60)
Glucose, Bld: 114 mg/dL — ABNORMAL HIGH (ref 70–99)
Phosphorus: 5.1 mg/dL — ABNORMAL HIGH (ref 2.5–4.6)
Potassium: 3.7 mmol/L (ref 3.5–5.1)
Sodium: 137 mmol/L (ref 135–145)

## 2019-04-17 LAB — PARATHYROID HORMONE, INTACT (NO CA): PTH: 154 pg/mL — ABNORMAL HIGH (ref 15–65)

## 2019-04-17 LAB — PROTIME-INR
INR: 2.3 — ABNORMAL HIGH (ref 0.8–1.2)
Prothrombin Time: 24.8 seconds — ABNORMAL HIGH (ref 11.4–15.2)

## 2019-04-17 MED ORDER — CHLORHEXIDINE GLUCONATE CLOTH 2 % EX PADS
6.0000 | MEDICATED_PAD | Freq: Every day | CUTANEOUS | Status: DC
  Administered 2019-04-17 – 2019-04-22 (×6): 6 via TOPICAL

## 2019-04-17 MED ORDER — AMIODARONE HCL IN DEXTROSE 360-4.14 MG/200ML-% IV SOLN
30.0000 mg/h | INTRAVENOUS | Status: DC
  Administered 2019-04-17 – 2019-04-22 (×12): 30 mg/h via INTRAVENOUS
  Filled 2019-04-17 (×14): qty 200

## 2019-04-17 MED ORDER — WARFARIN SODIUM 7.5 MG PO TABS
7.5000 mg | ORAL_TABLET | Freq: Once | ORAL | Status: AC
  Administered 2019-04-17: 7.5 mg via ORAL
  Filled 2019-04-17: qty 1

## 2019-04-17 MED ORDER — SODIUM CHLORIDE 0.9% FLUSH: 10.0000 mL | INTRAVENOUS | Status: DC | PRN

## 2019-04-17 MED ORDER — SODIUM CHLORIDE 0.9% FLUSH
10.0000 mL | Freq: Two times a day (BID) | INTRAVENOUS | Status: DC
  Administered 2019-04-17: 10 mL
  Administered 2019-04-17: 20 mL
  Administered 2019-04-18 – 2019-04-25 (×15): 10 mL

## 2019-04-17 MED ORDER — AMIODARONE HCL IN DEXTROSE 360-4.14 MG/200ML-% IV SOLN
60.0000 mg/h | INTRAVENOUS | Status: AC
  Administered 2019-04-17 (×2): 60 mg/h via INTRAVENOUS
  Filled 2019-04-17: qty 200

## 2019-04-17 MED ORDER — AMIODARONE LOAD VIA INFUSION
150.0000 mg | Freq: Once | INTRAVENOUS | Status: AC
  Administered 2019-04-17: 150 mg via INTRAVENOUS
  Filled 2019-04-17: qty 83.34

## 2019-04-17 MED ORDER — HYDROCODONE-ACETAMINOPHEN 5-325 MG PO TABS
2.0000 | ORAL_TABLET | Freq: Once | ORAL | Status: AC
  Administered 2019-04-17: 2 via ORAL
  Filled 2019-04-17: qty 2

## 2019-04-17 NOTE — Progress Notes (Signed)
Sabrina Beck Progress Note    Assessment/ Plan:   1. Dyspnea on exertion- multifactorial with hx of fluid overload as well as PNA and a fib.  Improved.  S/p UF with HD as below.    2. ESRD- Normally MWF schedule at [facility] s/p HD on 9/6 for dyspnea/overload (3 kg net off) and had HD on 9/7 as well.  S/p UF only tx on 9/8 then had HD on 9/9  - No acute indication for HD today - HD per MWF schedule on 9/11   - Midodrine prior to HD to help maintain BP and help with UF.  Have reduced dose.  She is nearing euvolemic on my exam   - Note that she is charted as being below her outpatient dry weight.  Standing weights if possible   3. A fib with RVR - rate control per primary team and cardiology.  Cardiology feels a fib multifactorial with PNA, steroids  4. Fluid volume overload - Does not have a history of large idwg per her primary Nephrology PA.  - s/p daily UF/HD as above.  Weigh standing up if able.  Unsure if weights accurate however she has been below reported EDW   5. HCAP-  abx per primary svc.  on augmentin.    6. Anemiaof CKD- Cont with ESA and follow.  CBC in AM  7. Secondary hyperparathyroidism- Hyperphos - on sevelamer.  Hectorol is on hold for hypercalcemia as below.  Intact PTH is pending to guide adjustment of dose as she will need restarted  8. Hypercalcemia - hectorol is outpatient regimen as below.  Hectorol is on hold for now with hypercalcemia as above.  Low calcium bath with HD.  Discontinued vitamin D (cholecalciferol) as well   9. Nutrition- would recommend renal diet    Subjective:   Had HD on 9/9 with 1.7 kg net UF.   Feels better today.  She has been on esmolol at 150 mcg/kg/min.  She thinks her abdomen feels less swollen.  Review of systems:  She has shortness of breath on exertion which is improving.   Denies chest pain.   Denies nausea or vomiting.  BM yesterday Has been out of bed to chair No dizziness   Objective:    BP 96/60   Pulse (!) 111   Temp 98 F (36.7 C) (Oral)   Resp 17   Ht 5\' 4"  (1.626 m)   Wt 97.7 kg   SpO2 94%   BMI 36.97 kg/m   Intake/Output Summary (Last 24 hours) at 04/17/2019 0754 Last data filed at 04/16/2019 1800 Gross per 24 hour  Intake 240 ml  Output 1744 ml  Net -1504 ml   Weight change: 4.35 kg  Physical Exam: General elderly female in bed in no acute distress  HEENT normocephalic atraumatic extraocular movements intact sclera anicteric Neck supple trachea midline Lungs breath sounds reduced at bases but improved air movement; unlabored at rest; no crackles or wheezing noted.  On oxygen at 1 liter per nasal cannula with sat 99%  Heart tachycardic and irregular; no rub   Abdomen soft nontender obese body habitus  Extremities no edema appreciated Psych normal mood and affect Neuro - alert and oriented and provides history Access: LUE AVF bruit and thrill +  Imaging: No results found.  Labs: BMET Recent Labs  Lab 04/11/19 7341 04/12/19 0348 04/13/19 0410 04/14/19 0420 04/15/19 0434 04/16/19 0434 04/17/19 0402  NA 135 136 136 140 140 137 137  K  5.0 4.2 4.6 4.1 4.1 4.0 3.7  CL 96* 95* 97* 100 100 96* 98  CO2 26 28 25 25 25 28 27   GLUCOSE 123* 154* 165* 128* 127* 131* 114*  BUN 46* 25* 52* 44* 38* 38* 32*  CREATININE 9.25* 5.08* 7.28* 5.67* 4.87*  4.87* 4.60* 4.11*  CALCIUM 9.6 9.8 10.3 10.6* 10.3 10.6* 9.8  PHOS 7.5* 6.1* 5.5* 5.2* 4.8* 5.2* 5.1*   CBC Recent Labs  Lab 04/11/19 0937 04/14/19 1730 04/15/19 0434 04/16/19 0434  WBC 6.8 12.1* 10.2 10.9*  HGB 9.3* 10.2* 10.2* 10.6*  HCT 28.8* 31.6* 31.9* 33.2*  MCV 109.9* 109.7* 109.6* 110.3*  PLT 154 266 256 277    Medications:    . amoxicillin-clavulanate  1 tablet Oral Q24H  . budesonide (PULMICORT) nebulizer solution  0.5 mg Nebulization BID  . Chlorhexidine Gluconate Cloth  6 each Topical Daily  . Chlorhexidine Gluconate Cloth  6 each Topical Q0600  . epoetin (EPOGEN/PROCRIT)  injection  2,800 Units Intravenous Q M,W,F-HD  . gabapentin  100 mg Oral BID  . ipratropium  0.5 mg Nebulization TID  . levalbuterol  1.25 mg Nebulization TID  . mouth rinse  15 mL Mouth Rinse BID  . metoprolol tartrate  25 mg Oral BID  . midodrine  5 mg Oral Q M,W,F-HD  . pantoprazole  40 mg Oral Daily  . predniSONE  20 mg Oral Q breakfast  . sevelamer carbonate  2,400 mg Oral TID WC  . Warfarin - Pharmacist Dosing Inpatient   Does not apply q1800    [id number] Bath 2K/2.5CaTime 3.5 hrsHeparin none. AccessLAVFBFR 400DFR 600  Hectoral 9.43mcg IV/HD Epogen 2,800Units IV/HD   Claudia Desanctis 04/17/2019, 7:54 AM

## 2019-04-17 NOTE — Progress Notes (Signed)
[place] for Coumadin Indication: atrial fibrillation  No Known Allergies  Patient Measurements: Height: 5\' 4"  (162.6 cm) Weight: 215 lb 6.2 oz (97.7 kg) IBW/kg (Calculated) : 54.7  Vital Signs: Temp: 98 F (36.7 C) (09/10 0400) Temp Source: Oral (09/10 0400) BP: 96/60 (09/10 0600) Pulse Rate: 111 (09/10 0600)  Labs: Recent Labs    04/14/19 1730 04/15/19 0434 04/16/19 0434 04/17/19 0402  HGB 10.2* 10.2* 10.6*  --   HCT 31.6* 31.9* 33.2*  --   PLT 266 256 277  --   LABPROT  --  25.3* 24.3* 24.8*  INR  --  2.3* 2.2* 2.3*  CREATININE  --  4.87*  4.87* 4.60* 4.11*    Estimated Creatinine Clearance: 12.6 mL/min (A) (by C-G formula based on SCr of 4.11 mg/dL (H)).   Medical History: Past Medical History:  Diagnosis Date  . Anemia   . Atrial fibrillation ([place])   . Chest pain 05/15/2006   Stress test negative for ischemia  . Diastolic heart failure ([place])   . Diverticulosis   . ESRD on hemodialysis ([place])   . GERD (gastroesophageal reflux disease)   . Headache   . Mixed hyperlipidemia   . Peripheral neuropathy   . PONV (postoperative nausea and vomiting)   . Systemic hypertension   . Uterine cancer ([facility])     Medications:  Medications Prior to Admission  Medication Sig Dispense Refill Last Dose  . acetaminophen (TYLENOL) 500 MG tablet Take 1,000 mg by mouth every 6 (six) hours as needed for moderate pain.    Past Week at Unknown time  . Cholecalciferol (VITAMIN D) 2000 UNITS CAPS Take 2,000 Units by mouth every evening.    04/07/2019 at Unknown time  . gabapentin (NEURONTIN) 100 MG capsule Take 100 mg by mouth 2 (two) times daily.    04/08/2019 at Unknown time  . metoprolol tartrate (LOPRESSOR) 50 MG tablet Take 1 tablet (50 mg total) by mouth 2 (two) times daily. (Patient taking differently: Take 50 mg by mouth See admin instructions. Take one tablet every day at bedtime. Take one tablet every day in the morning on days that are  non-dialysis days (T-TH-S-S)) 60 tablet 6 04/08/2019 at [place]  . Multiple Vitamins-Minerals (PRESERVISION AREDS 2 PO) Take 1 tablet by mouth 2 (two) times daily.   04/08/2019 at Unknown time  . multivitamin (RENA-VIT) TABS tablet Take 1 tablet by mouth every evening.    04/07/2019 at Unknown time  . Omega-3 Fatty Acids (OMEGA-3 FISH OIL) 300 MG CAPS Take 300 mg by mouth 2 (two) times daily.   04/08/2019 at Unknown time  . omeprazole (PRILOSEC) 20 MG capsule Take 20 mg by mouth every morning.    04/08/2019 at Unknown time  . Propylene Glycol (SYSTANE BALANCE) 0.6 % SOLN Place 1 drop into both eyes 3 (three) times daily as needed (for burning or dry eyes).    Past Month at Unknown time  . RENVELA 800 MG tablet Take 2,400 mg by mouth 3 (three) times daily with meals. Takes 1 tablet with snacks   04/08/2019 at Unknown time  . warfarin (COUMADIN) 5 MG tablet Take 1.5 to 2 tablets by mouth daily as directed by coumadin clinic (Patient taking differently: Take 7.5-10 mg by mouth See admin instructions. 10mg  (10mg  total) on Mondays, then take 7.5mg  (1.5 tablets) on all other days in the evening.) 150 tablet 1 04/07/2019 at 2000    Assessment: 78 y.o.female,with history of uterine cancer, hypertension,  peripheral neuropathy, hyperlipidemia, GERD, ESRD on dialysis M-W-F, diastolic heart failure, atrial fibrillation, and anemia presents to the ED with shortness of breath. Patient had been getting progressively short of breath over 5 days. Patient chronically anticoagulated with coumadin. Pharmacy asked to monitor and dose coumadin.   Home dose is 10mg  on Monday and 7.5mg  all other days.  INR 2.3  Goal of Therapy:  INR 2-3 Monitor platelets by anticoagulation protocol: Yes  Plan:  Coumadin 7.5mg  po x 1 today Daily PT-INR Monitor for s/s of bleeding  Margot Ables, PharmD Clinical Pharmacist 04/17/2019 8:26 AM

## 2019-04-17 NOTE — Progress Notes (Addendum)
Progress Note  Patient Name: Sabrina Beck Date of Encounter: 04/17/2019  Primary Cardiologist: Sanda Klein, MD   Subjective   No pain, up in chair, having neb treatment  Inpatient Medications    Scheduled Meds: . amoxicillin-clavulanate  1 tablet Oral Q24H  . budesonide (PULMICORT) nebulizer solution  0.5 mg Nebulization BID  . Chlorhexidine Gluconate Cloth  6 each Topical Daily  . Chlorhexidine Gluconate Cloth  6 each Topical Q0600  . epoetin (EPOGEN/PROCRIT) injection  2,800 Units Intravenous Q M,W,F-HD  . gabapentin  100 mg Oral BID  . ipratropium  0.5 mg Nebulization TID  . levalbuterol  1.25 mg Nebulization TID  . mouth rinse  15 mL Mouth Rinse BID  . metoprolol tartrate  25 mg Oral BID  . midodrine  5 mg Oral Q M,W,F-HD  . pantoprazole  40 mg Oral Daily  . predniSONE  20 mg Oral Q breakfast  . sevelamer carbonate  2,400 mg Oral TID WC  . Warfarin - Pharmacist Dosing Inpatient   Does not apply q1800   Continuous Infusions: . sodium chloride    . sodium chloride    . esmolol 71.378 mcg/kg/min (04/17/19 0616)   PRN Meds: sodium chloride, sodium chloride, acetaminophen, albuterol, lidocaine (PF), lidocaine-prilocaine, ondansetron (ZOFRAN) IV, pentafluoroprop-tetrafluoroeth   Vital Signs    Vitals:   04/17/19 0300 04/17/19 0400 04/17/19 0500 04/17/19 0600  BP: 107/62 108/72 108/68 96/60  Pulse: (!) 106 (!) 109 (!) 119 (!) 111  Resp: 20 (!) 26 17   Temp:  98 F (36.7 C)    TempSrc:  Oral    SpO2: 96% 96% 97% 94%  Weight:   97.7 kg   Height:        Intake/Output Summary (Last 24 hours) at 04/17/2019 0808 Last data filed at 04/16/2019 1800 Gross per 24 hour  Intake 240 ml  Output 1744 ml  Net -1504 ml   Last 3 Weights 04/17/2019 04/16/2019 04/15/2019  Weight (lbs) 215 lb 6.2 oz 215 lb 6.2 oz 212 lb 11.9 oz  Weight (kg) 97.7 kg 97.7 kg 96.5 kg      Telemetry    A fib with average HR 110-120 but overall improved control - Personally Reviewed  ECG     No new - Personally Reviewed  Physical Exam   GEN: No acute distress.   Neck: No JVD Cardiac: irreg irreg no murmurs, rubs, or gallops.  Respiratory: Clear to auscultation bilaterally. Improved GI: Soft, nontender, non-distended  MS: No edema; No deformity. Neuro:  Nonfocal  Psych: Normal affect   Labs    High Sensitivity Troponin:   Recent Labs  Lab 04/08/19 1600 04/08/19 1822  TROPONINIHS 20* 22*      Chemistry Recent Labs  Lab 04/15/19 0434 04/16/19 0434 04/17/19 0402  NA 140 137 137  K 4.1 4.0 3.7  CL 100 96* 98  CO2 25 28 27   GLUCOSE 127* 131* 114*  BUN 38* 38* 32*  CREATININE 4.87*  4.87* 4.60* 4.11*  CALCIUM 10.3 10.6* 9.8  ALBUMIN 3.9 3.7 3.6  GFRNONAA 8*  8* 8* 10*  GFRAA 9*  9* 10* 11*  ANIONGAP 15 13 12      Hematology Recent Labs  Lab 04/14/19 1730 04/15/19 0434 04/16/19 0434  WBC 12.1* 10.2 10.9*  RBC 2.88* 2.91* 3.01*  HGB 10.2* 10.2* 10.6*  HCT 31.6* 31.9* 33.2*  MCV 109.7* 109.6* 110.3*  MCH 35.4* 35.1* 35.2*  MCHC 32.3 32.0 31.9  RDW 13.3 13.2 13.2  PLT 266 256 277    BNPNo results for input(s): BNP, PROBNP in the last 168 hours.   DDimer No results for input(s): DDIMER in the last 168 hours.   Radiology    No results found.  Cardiac Studies   Echo 08/22/18  Study Conclusions  - Left ventricle: The cavity size was normal. Wall thickness was increased in a pattern of moderate LVH. The estimated ejection fraction was 45%. There is hypokinesis of the midinferoseptal myocardium. The study was not technically sufficient to allow evaluation of LV diastolic dysfunction due to atrial fibrillation. - Aortic valve: Mildly calcified annulus. Trileaflet; mildly calcified leaflets. - Mitral valve: Mildly calcified annulus. There was mild regurgitation. - Left atrium: The atrium was moderately dilated. - Right atrium: The atrium was mildly to moderately dilated. - Atrial septum: No defect or patent foramen ovale  was identified. - Tricuspid valve: There was mild regurgitation. - Pulmonary arteries: PA peak pressure: 21 mm Hg (S). - Pericardium, extracardiac: There was no pericardial effusion.   Patient Profile     78 y.o. female with a hx of permanent atrial fib, anticoagulation on coumadin, HTN, ESRD on HD, EF 45% in Jan with acute illness, now admitted with PNA and A fib RVR  Assessment & Plan    1. Atrial fib with RVR on lopressor 25 BID at home was on 50 mg BID, BP is elevated as well. May need Increase to 50 mg BID but with soft BP with dialysis this is difficult. .  Placed on esmolol. HR still 108 to 120 in her permanent a fib.  2. anticoagulation on coumadin. INR 2.3.  3. PNA per IM on antibiotics  4. ESRD on HD MWF. Did have dialysis yesterday.   5. Chronic diastolic and systolic HF last EF in Jan was 45%. Mod LVH. Possible recheckthis admit.  Neg 5602 since admit and wt down from 106 to 97.7  Kg.  continued improvement will improve HR as well.  6. HTN though soft BPs with dialysis. Plan for midodrine prior to dialysis.  now BP 117/80 to 91/65 similar to yesterday. Still drops in dialysis to 80s  7. Anemia of chronic disease per IM and Renal. Hgb 10.6           For questions or updates, please contact [place] Please consult [email or web address] for contact info under        Signed, Cecilie Kicks, NP  04/17/2019, 8:08 AM    Patient seen and disucssed with PA Dorene Ar, I agree with her documentation above. History of permanent afib historically difficult to manage due to significant issues with hypotension during HD. Issues with afib with RVR this admit by pneumonia/HF/steroids/bronchodilators. In setting of elevated rates and significant hypotension during HD this admission we started esmolol in hopes that b1 selectivity and short half life would be favorable given her issues with HD and hypotension. Unable to reach HD goal yesterday due to low bp's, she was given midodrine  5mg  prior, only able to remove 822 mL. Of note she has had a significant HD regimen this week in setting of fluid overload and is below her dry weight. Currently on esmolol 19mcg/kg/min and metoprolol 25mg  bid. Pressures remain soft SBPs high 90s to low 100s. Would avoid digoxin given ESRD and elderly. We hope that as her systemic illnessess as reported above resolve her rates will begin to improve and will be able to settle back into her home regimen. She is permanent afib so would not attempt rhythm  control/DCCV strategy, may consider adding amio on for additional rate control in setting of low bp's, she had gotten some boluses a few days ago but never committed to drip.  Room to titrate midodrine doses prior to HD if needed. If bp stabilizes would titrate her oral lopressor. Recheck echo once rates better controlled   Carlyle Dolly MD

## 2019-04-17 NOTE — Op Note (Signed)
Patient:  Sabrina Beck  DOB:  1940/10/08  MRN:  [id number]   Preop Diagnosis: End-stage renal disease, need for central venous access  Postop Diagnosis: Same  Procedure: Central line insertion  Surgeon: Aviva Signs, MD  Anes: Local  Indications: Patient is a 78 year old white female with end-stage renal disease on dialysis who needs central venous access due to multiple IV medications being given.  She has a lack of peripheral access.  The risks and benefits of the procedure including bleeding and infection were fully explained to the patient, who gave informed consent.  Of note is the fact the patient is on Coumadin.  Procedure note: The patient had the procedure done at bedside.  Informed consent was obtained.  A timeout was performed.  The right groin area was prepped and draped using usual sterile technique with ChloraPrep.  Mask, gown, gloves, and sterile drape were all used.  The right femoral vein was accessed using the Seldinger technique without difficulty.  The guidewire was then advanced without difficulty.  An introducer was placed over the guidewire.  A triple-lumen catheter was then inserted and the guidewire was removed.  Good backflow of venous blood was noted on aspiration of all 3 ports.  All 3 ports were flushed with saline.  The cath was secured in place using a 3-0 silk suture.  A dry sterile dressing was placed.  Patient tolerated the procedure well.  Complications: None  EBL: Minimal  Specimen: None

## 2019-04-17 NOTE — Progress Notes (Signed)
Physical Therapy Treatment Patient Details Name: Sabrina Beck MRN: [id number] DOB: [birth year] Today's Date: 04/17/2019    History of Present Illness 78 y.o. 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  Patient had been getting progressively short of breath over 5 days.  She reports that it felt like she just could not take a deep breath in.  Patient denies any chest pain, coughing, fever, dizziness.    PT Comments    Patient presents seated in chair and agreeable for therapy.  Patient demonstrates good return for completing BLE ROM/strengthening exercises with verbal cues, slow slightly labored cadence during ambulation, limited to doorway and back to bedside due to c/o fatigue, on room air with SpO2 at 90-92% during ambulation and SpO2 at 92-94% while resting in chair on room air - RN notified.  Patient will benefit from continued physical therapy in hospital and recommended venue below to increase strength, balance, endurance for safe ADLs and gait.    Follow Up Recommendations  Home health PT;Supervision for mobility/OOB     Equipment Recommendations  None recommended by PT    Recommendations for Other Services       Precautions / Restrictions Precautions Precautions: Fall Restrictions Weight Bearing Restrictions: No    Mobility  Bed Mobility               General bed mobility comments: Patient presents up in chair  Transfers Overall transfer level: Needs assistance Equipment used: Quad cane Transfers: Sit to/from [facility] Sit to Stand: Supervision;Min guard Stand pivot transfers: Supervision;Min guard       General transfer comment: increased time, labored movement  Ambulation/Gait Ambulation/Gait assistance: Supervision;Min guard Gait Distance  (Feet): 25 Feet Assistive device: Quad cane Gait Pattern/deviations: Step-through pattern;Decreased step length - right;Decreased step length - left;Decreased stride length Gait velocity: decreased   General Gait Details: slow, slightly labored cadence without occasional unsteadiness especially when making turns without loss of balance   Stairs             Wheelchair Mobility    Modified Rankin (Stroke Patients Only)       Balance Overall balance assessment: Needs assistance Sitting-balance support: Feet supported;No upper extremity supported Sitting balance-Leahy Scale: Good Sitting balance - Comments: seated in chair   Standing balance support: During functional activity;Single extremity supported Standing balance-Leahy Scale: Fair Standing balance comment: using quad cane                            Cognition Arousal/Alertness: Awake/alert Behavior During Therapy: WFL for tasks assessed/performed Overall Cognitive Status: Within Functional Limits for tasks assessed                                        Exercises General Exercises - Lower Extremity Long Arc Quad: Seated;AROM;Strengthening;Both;10 reps Hip Flexion/Marching: Seated;AROM;Strengthening;Both;10 reps Toe Raises: Seated;AROM;Strengthening;Both;10 reps Heel Raises: Seated;AROM;Strengthening;Both;10 reps    General Comments        Pertinent Vitals/Pain Pain Assessment: No/denies pain    Home Living                      Prior Function            PT Goals (  current goals can now be found in the care plan section) Acute Rehab PT Goals Patient Stated Goal: return home with family to assist Time For Goal Achievement: 04/28/19 Potential to Achieve Goals: Good Progress towards PT goals: Progressing toward goals    Frequency    Min 3X/week      PT Plan Current plan remains appropriate    Co-evaluation              AM-PAC PT "6 Clicks" Mobility    Outcome Measure  Help needed turning from your back to your side while in a flat bed without using bedrails?: A Little Help needed moving from lying on your back to sitting on the side of a flat bed without using bedrails?: A Little Help needed moving to and from a bed to a chair (including a wheelchair)?: A Little Help needed standing up from a chair using your arms (e.g., wheelchair or bedside chair)?: A Little Help needed to walk in hospital room?: A Little Help needed climbing 3-5 steps with a railing? : A Lot 6 Click Score: 17    End of Session   Activity Tolerance: Patient tolerated treatment well;Patient limited by fatigue Patient left: in chair;with call bell/phone within reach Nurse Communication: Mobility status PT Visit Diagnosis: Unsteadiness on feet (R26.81);Other abnormalities of gait and mobility (R26.89);Muscle weakness (generalized) (M62.81)     Time: 6945-0388 PT Time Calculation (min) (ACUTE ONLY): 25 min  Charges:  $Therapeutic Exercise: 8-22 mins $Therapeutic Activity: 8-22 mins                     1:52 PM, 04/17/19 Lonell Grandchild, MPT Physical Therapist with [facility] [phone] office [phone] mobile phone

## 2019-04-17 NOTE — Progress Notes (Signed)
PROGRESS NOTE  Sabrina Beck OTL:[id number] DOB: 1941-02-12 DOA: 04/08/2019 PCP: [employer], MD Brief History:  78 y.o.femalewith medical history significant ofend-stage renal disease, permanent atrial fibrillation on chronic anticoagulation with Coumadin, chronic systolic and diastolic CHF, hypertension, GERD, presents to the hospital with chief complaint of significant shortness of breath x 5 days.  She reports that it felt like she just could not take a deep breath in. Patient denies any chest pain, coughing, fever, dizziness, n/v/d.  ED course Chemistry shows a glucose of 140, BUN of 45, creatinine of 9.98, and GFR of 3. Patient has a BNP of 344. Initial troponin is 20. Hematology reveals no leukocytosis, but a macrocytic anemia with a hemoglobin of 11. Chest x-ray was done that shows enlargement of cardiac silhouette. Right lower lobe consolidation consistent with pneumonia. Blood cultures drawn. Lactic acid 1.6. Troponin stable at 22. Second lactic acid 1.3.  Assessment/Plan: Acute hypoxic respiratory failure with hypoxia due tolobar pneumoniaas well as pulmonary edema -Patient was admitted to stepdown and placed on supplemental oxygen.  -now weaned to RA -Nephrology follow up appreciated -Wheezing has improved -Continuepulmicort to 0.5 mg bid during hospitalization -pt endorsed some indiscretion with fluidsat home -1/16--Echo--EF 45%, mid inferior septal HK, PASP 21, mild TR/MR -finished 8 days abx during hospitalization  chronic systolic and diastolic CHF -Given the decrease in EF, discontinued diltiazem in Jan 2020 -08/22/18--Echo--EF 45%, mid inferior septal HK, PASP 21, mild TR/MR -continue metoprolol tartrate 25 mg bid -wt down from 106 to 96.5 Kg. -now appears near euvolemia  ESRD -last HD 04/15/19--UF only -HD on 9/9, planning on 9/11 -appreciate nephrology -planning HD today -midodrine on HD to help maintain BP  Bronchospasm  -Continue Pulmicort to 0.5 mg bid -d/c prednisone -addedatrovent neb -improved -continue atrovent -added xopenex  Atrial fibrillation, permanent, with RVR -This is likely in the setting of fluid overload, -she was placed on esmolol infusion initially--continue -rateworse due to acute hypoxia and fluid overload. -Continue Coumadin per pharmacy -continue metoprolol and esmolol drip -rate control difficult due to low BPs -Appreciate cardiology consultation  -amiodarone drip started 9/10 -anticipate HR will continue to improve with improvement of her acute illness  Hypercalcemia -Hectorol on hold -per nephrology  Hypertension -Blood pressure remained stable/soft  Anemia of end-stage renal disease -Hemoglobin stable, no bleeding -continue ESA  GERD -Continue home PPI      Disposition Plan:   Home 1-2 days when HR better controlled Family Communication:   Family at bedside  Consultants: renal, cardiology   Code Status:  FULL   DVT Prophylaxis:  coumadin   Procedures: Femoral CVL--9/10>>>>  Antibiotics: vanc 9/2 Ceftriaxone 9/1 Cefepime 9/2>>>9/5 Amox/clav 9/6>>>    Subjective: Patient is feeling better.  She says her breathing is improving.  She denies any headache, chest pain, coughing, hemoptysis, nausea, vomiting, diarrhea, abdominal pain.  There is no coughing or hemoptysis.  Objective: Vitals:   04/17/19 1300 04/17/19 1334 04/17/19 1400 04/17/19 1403  BP:      Pulse: (!) 112  (!) 117 (!) 106  Resp: 19  15 17   Temp:      TempSrc:      SpO2: 94% 95% 96% 98%  Weight:      Height:        Intake/Output Summary (Last 24 hours) at 04/17/2019 1631 Last data filed at 04/17/2019 1442 Gross per 24 hour  Intake 2264.39 ml  Output 1744 ml  Net 520.39 ml  Weight change: 4.35 kg Exam:   General:  Pt is alert, follows commands appropriately, not in acute distress  HEENT: No icterus, No thrush, No neck mass, [place]/AT   Cardiovascular: RRR, S1/S2, no rubs, no gallops  Respiratory: Bibasilar crackles, recurrent left.  No wheezing.  Abdomen: Soft/+BS, non tender, non distended, no guarding  Extremities: No edema, No lymphangitis, No petechiae, No rashes, no synovitis   Data Reviewed: I have personally reviewed following labs and imaging studies Basic Metabolic Panel: Recent Labs  Lab 04/13/19 0410 04/14/19 0420 04/15/19 0434 04/16/19 0434 04/17/19 0402  NA 136 140 140 137 137  K 4.6 4.1 4.1 4.0 3.7  CL 97* 100 100 96* 98  CO2 25 25 25 28 27   GLUCOSE 165* 128* 127* 131* 114*  BUN 52* 44* 38* 38* 32*  CREATININE 7.28* 5.67* 4.87*  4.87* 4.60* 4.11*  CALCIUM 10.3 10.6* 10.3 10.6* 9.8  PHOS 5.5* 5.2* 4.8* 5.2* 5.1*   Liver Function Tests: Recent Labs  Lab 04/13/19 0410 04/14/19 0420 04/15/19 0434 04/16/19 0434 04/17/19 0402  ALBUMIN 3.8 3.8 3.9 3.7 3.6   No results for input(s): LIPASE, AMYLASE in the last 168 hours. No results for input(s): AMMONIA in the last 168 hours. Coagulation Profile: Recent Labs  Lab 04/13/19 0409 04/14/19 0419 04/15/19 0434 04/16/19 0434 04/17/19 0402  INR 2.1* 2.6* 2.3* 2.2* 2.3*   CBC: Recent Labs  Lab 04/11/19 0937 04/14/19 1730 04/15/19 0434 04/16/19 0434  WBC 6.8 12.1* 10.2 10.9*  HGB 9.3* 10.2* 10.2* 10.6*  HCT 28.8* 31.6* 31.9* 33.2*  MCV 109.9* 109.7* 109.6* 110.3*  PLT 154 266 256 277   Cardiac Enzymes: No results for input(s): CKTOTAL, CKMB, CKMBINDEX, TROPONINI in the last 168 hours. BNP: Invalid input(s): POCBNP CBG: Recent Labs  Lab 04/11/19 1131  GLUCAP 101*   HbA1C: No results for input(s): HGBA1C in the last 72 hours. Urine analysis:    Component Value Date/Time   COLORURINE YELLOW 03/03/2012 1500   APPEARANCEUR CLEAR 03/03/2012 1500   LABSPEC 1.020 03/03/2012 1500   PHURINE 6.0 03/03/2012 1500   GLUCOSEU 100 (A) 03/03/2012 1500   HGBUR TRACE (A) 03/03/2012 1500   BILIRUBINUR NEGATIVE 03/03/2012 1500    KETONESUR NEGATIVE 03/03/2012 1500   PROTEINUR 100 (A) 03/03/2012 1500   UROBILINOGEN 0.2 03/03/2012 1500   NITRITE NEGATIVE 03/03/2012 1500   LEUKOCYTESUR NEGATIVE 03/03/2012 1500   Sepsis Labs: @LABRCNTIP (procalcitonin:4,lacticidven:4) ) Recent Results (from the past 240 hour(s))  SARS Coronavirus 2 [facility] order, Performed in [facility] hospital lab) Nasopharyngeal Nasopharyngeal Swab     Status: None   Collection Time: 04/08/19  5:50 PM   Specimen: Nasopharyngeal Swab  Result Value Ref Range Status   SARS Coronavirus 2 NEGATIVE NEGATIVE Final    Comment: (NOTE) If result is NEGATIVE SARS-CoV-2 target nucleic acids are NOT DETECTED. The SARS-CoV-2 RNA is generally detectable in upper and lower  respiratory specimens during the acute phase of infection. The lowest  concentration of SARS-CoV-2 viral copies this assay can detect is 250  copies / mL. A negative result does not preclude SARS-CoV-2 infection  and should not be used as the sole basis for treatment or other  patient management decisions.  A negative result may occur with  improper specimen collection / handling, submission of specimen other  than nasopharyngeal swab, presence of viral mutation(s) within the  areas targeted by this assay, and inadequate number of viral copies  (<250 copies / mL). A negative result must be combined with clinical  observations, patient history, and epidemiological information. If result is POSITIVE SARS-CoV-2 target nucleic acids are DETECTED. The SARS-CoV-2 RNA is generally detectable in upper and lower  respiratory specimens dur ing the acute phase of infection.  Positive  results are indicative of active infection with SARS-CoV-2.  Clinical  correlation with patient history and other diagnostic information is  necessary to determine patient infection status.  Positive results do  not rule out bacterial infection or co-infection with other viruses. If result is PRESUMPTIVE POSTIVE  SARS-CoV-2 nucleic acids MAY BE PRESENT.   A presumptive positive result was obtained on the submitted specimen  and confirmed on repeat testing.  While 2019 novel coronavirus  (SARS-CoV-2) nucleic acids may be present in the submitted sample  additional confirmatory testing may be necessary for epidemiological  and / or clinical management purposes  to differentiate between  SARS-CoV-2 and other Sarbecovirus currently known to infect humans.  If clinically indicated additional testing with an alternate test  methodology [phone]) is advised. The SARS-CoV-2 RNA is generally  detectable in upper and lower respiratory sp ecimens during the acute  phase of infection. The expected result is Negative. Fact Sheet for Patients:  StrictlyIdeas.no Fact Sheet for Healthcare Providers: BankingDealers.co.za This test is not yet approved or cleared by the [place] FDA and has been authorized for detection and/or diagnosis of SARS-CoV-2 by FDA under an Emergency Use Authorization (EUA).  This EUA will remain in effect (meaning this test can be used) for the duration of the COVID-19 declaration under Section 564(b)(1) of the Act, 21 U.S.C. section 360bbb-3(b)(1), unless the authorization is terminated or revoked sooner. Performed at [facility], [address], [place], [place] [address]   Culture, blood (routine x 2)     Status: None   Collection Time: 04/08/19  6:21 PM   Specimen: BLOOD  Result Value Ref Range Status   Specimen Description BLOOD RIGHT ANTECUBITAL  Final   Special Requests   Final    BOTTLES DRAWN AEROBIC AND ANAEROBIC Blood Culture adequate volume   Culture   Final    NO GROWTH 5 DAYS Performed at [facility], [address], [place], [place] [address]    Report Status 04/13/2019 FINAL  Final  Culture, blood (routine x 2)     Status: None   Collection Time: 04/08/19  6:22 PM   Specimen: BLOOD  Result Value Ref Range  Status   Specimen Description BLOOD RIGHT ANTECUBITAL  Final   Special Requests   Final    BOTTLES DRAWN AEROBIC AND ANAEROBIC Blood Culture adequate volume   Culture   Final    NO GROWTH 5 DAYS Performed at [facility], [address], [place], [place] [address]    Report Status 04/13/2019 FINAL  Final  MRSA PCR Screening     Status: None   Collection Time: 04/09/19  9:45 PM   Specimen: Nasopharyngeal  Result Value Ref Range Status   MRSA by PCR NEGATIVE NEGATIVE Final    Comment:        The GeneXpert MRSA Assay (FDA approved for NASAL specimens only), is one component of a comprehensive MRSA colonization surveillance program. It is not intended to diagnose MRSA infection nor to guide or monitor treatment for MRSA infections. Performed at [facility], [address], [place], [place] [address]   Culture, sputum-assessment     Status: None   Collection Time: 04/13/19  9:30 AM   Specimen: Sputum  Result Value Ref Range Status   Specimen  Description SPUTUM  Final   Special Requests NONE  Final   Sputum evaluation   Final    Sputum specimen not acceptable for testing.  Please recollect.   NOTIFIED L. HILTON,RN @1014   09/06/2020KAY  SPECIMEN TO BE RECOLLECTED Performed at [facility], [address] [place], [place] [address]    Report Status 04/13/2019 FINAL  Final  MRSA PCR Screening     Status: None   Collection Time: 04/14/19  2:38 PM   Specimen: Nasal Mucosa; Nasopharyngeal  Result Value Ref Range Status   MRSA by PCR NEGATIVE NEGATIVE Final    Comment:        The GeneXpert MRSA Assay (FDA approved for NASAL specimens only), is one component of a comprehensive MRSA colonization surveillance program. It is not intended to diagnose MRSA infection nor to guide or monitor treatment for MRSA infections. Performed at [facility], [address], [place], [place] [address]      Scheduled Meds: . amoxicillin-clavulanate  1 tablet Oral Q24H  . budesonide (PULMICORT)  nebulizer solution  0.5 mg Nebulization BID  . Chlorhexidine Gluconate Cloth  6 each Topical Daily  . Chlorhexidine Gluconate Cloth  6 each Topical Q0600  . Chlorhexidine Gluconate Cloth  6 each Topical Q0600  . epoetin (EPOGEN/PROCRIT) injection  2,800 Units Intravenous Q M,W,F-HD  . gabapentin  100 mg Oral BID  . ipratropium  0.5 mg Nebulization TID  . levalbuterol  1.25 mg Nebulization TID  . mouth rinse  15 mL Mouth Rinse BID  . metoprolol tartrate  25 mg Oral BID  . midodrine  5 mg Oral Q M,W,F-HD  . pantoprazole  40 mg Oral Daily  . predniSONE  20 mg Oral Q breakfast  . sevelamer carbonate  2,400 mg Oral TID WC  . sodium chloride flush  10-40 mL Intracatheter Q12H  . warfarin  7.5 mg Oral Once  . Warfarin - Pharmacist Dosing Inpatient   Does not apply q1800   Continuous Infusions: . sodium chloride    . sodium chloride    . amiodarone    . esmolol 125 mcg/kg/min (04/17/19 1442)    Procedures/Studies: Dg Chest 2 View  Result Date: 04/10/2019 CLINICAL DATA:  Shortness of breath. EXAM: CHEST - 2 VIEW COMPARISON:  04/08/2019 and 08/18/2018 FINDINGS: Cardiac size is within normal limits. Aortic atherosclerosis. New pulmonary vascular congestion. New bilateral perihilar infiltrates. Persistent hazy infiltrate at the right lung base posteriorly. Minimal blunting of the posterior costophrenic angles. No acute bone abnormality. IMPRESSION: New bilateral perihilar infiltrates. Persistent infiltrate at the right lung base. This could represent pulmonary edema or multifocal progressive pneumonia. Aortic Atherosclerosis (ICD10-I70.0). Electronically Signed   By: Lorriane Shire M.D.   On: 04/10/2019 11:04   Ct Head Wo Contrast  Result Date: 04/11/2019 CLINICAL DATA:  Transient ischemic attack.  Slurred speech. EXAM: CT HEAD WITHOUT CONTRAST TECHNIQUE: Contiguous axial images were obtained from the base of the skull through the vertex without intravenous contrast. COMPARISON:  CT scan of September 05, 2014. FINDINGS: Brain: No evidence of acute infarction, hemorrhage, hydrocephalus, extra-axial collection or mass lesion/mass effect. Vascular: No hyperdense vessel or unexpected calcification. Skull: Normal. Negative for fracture or focal lesion. Sinuses/Orbits: No acute finding. Other: None. IMPRESSION: No acute intracranial abnormality seen. Electronically Signed   By: Marijo Conception M.D.   On: 04/11/2019 12:08   Dg Chest Port 1 View  Result Date: 04/15/2019 CLINICAL DATA:  Shortness of breath. EXAM: PORTABLE CHEST 1 VIEW COMPARISON:  Radiographs  of April 13, 2019. FINDINGS: Stable cardiomegaly. No pneumothorax or definite pleural effusion is noted. Stable bilateral lung opacities are noted concerning for edema or inflammation. Bony thorax is unremarkable. IMPRESSION: Stable bilateral lung opacities as described above. Electronically Signed   By: Marijo Conception M.D.   On: 04/15/2019 07:31   Dg Chest Port 1 View  Result Date: 04/13/2019 CLINICAL DATA:  Shortness of breath and hypoxia in a patient with a history of end-stage renal disease. EXAM: PORTABLE CHEST 1 VIEW COMPARISON:  Single-view of the chest 04/11/2019. PA and lateral chest 04/10/2019. FINDINGS: Bilateral airspace disease has improved but there is persistent focal airspace opacity in the right mid and lower lung zones. There is cardiomegaly. No pneumothorax. Small bilateral pleural effusions. IMPRESSION: Persistent but improved bilateral airspace disease compatible with decreased pulmonary edema. More focal airspace opacity in the right midlung could be due to edema or pneumonia. Electronically Signed   By: Inge Rise M.D.   On: 04/13/2019 09:08   Dg Chest Port 1 View  Result Date: 04/11/2019 CLINICAL DATA:  Increasing shortness of breath following dialysis. EXAM: PORTABLE CHEST 1 VIEW COMPARISON:  04/10/2019. FINDINGS: Poor inspiration. Progressive enlargement of the cardiac silhouette with increased prominence of the pulmonary  vasculature interstitial markings. Progressive bilateral airspace opacities, greater on the right. No definite pleural fluid seen. Unremarkable bones. IMPRESSION: 1. Progressive cardiomegaly and changes of congestive heart failure. 2. Progressive bilateral alveolar edema or pneumonia, greater on the right. Electronically Signed   By: Claudie Revering M.D.   On: 04/11/2019 19:54   Dg Chest Port 1 View  Result Date: 04/08/2019 CLINICAL DATA:  Difficulty breathing for past few days, dyspnea on exertion, denies fever, history atrial fibrillation, heart failure, end-stage renal disease on dialysis, systemic hypertension, uterine cancer EXAM: PORTABLE CHEST 1 VIEW COMPARISON:  Portable exam 1645 hours compared to 08/18/2018 FINDINGS: Enlargement of cardiac silhouette. Mediastinal contours and pulmonary vascularity normal. Atherosclerotic calcification aorta. RIGHT lower lobe consolidation consistent with pneumonia. Remaining lungs clear. No pleural effusion or pneumothorax. Bones demineralized. IMPRESSION: Enlargement of cardiac silhouette. RIGHT lower lobe consolidation consistent with pneumonia. Electronically Signed   By: Lavonia Dana M.D.   On: 04/08/2019 17:18    Orson Eva, DO  [facility] Pager [phone]  If 7PM-7AM, please contact night-coverage [email or web address] Password [email or web address] 04/17/2019, 4:31 PM   LOS: 7 days

## 2019-04-17 NOTE — TOC Progression Note (Signed)
Transition of Care [facility]) - Progression Note    Patient Details  Name: ADALYNN CORNE MRN: [id number] Date of Birth: [birth year]  [facility]) CM/SW Contact  Ihor Gully, LCSW Phone Number: 04/17/2019, 8:49 AM  Clinical Narrative:    PT recommendation for HHPT was discussed with patient. Choices were provided to patient. Patient states that she will have to talk to her sons before she decides if she wants [facility] services.  TOC will follow up with patient once she speak with sons.   Expected Discharge Plan: [place] Barriers to Discharge: No Barriers Identified  Expected Discharge Plan and Services Expected Discharge Plan: [place]                                               Social Determinants of Health (SDOH) Interventions    Readmission Risk Interventions Readmission Risk Prevention Plan 04/15/2019 04/11/2019 04/10/2019  Transportation Screening Complete - Complete  PCP or Specialist Appt within 3-5 Days Complete Complete -  HRI or Home Care Consult Not Complete - -  Social Work Consult for [place] Planning/Counseling Complete - Complete  Palliative Care Screening Not Complete - Not Applicable  Medication Review Press photographer) Complete - Complete  Some recent data might be hidden

## 2019-04-18 ENCOUNTER — Other Ambulatory Visit: Payer: Self-pay | Admitting: Adult Health

## 2019-04-18 DIAGNOSIS — Z992 Dependence on renal dialysis: Secondary | ICD-10-CM

## 2019-04-18 DIAGNOSIS — J181 Lobar pneumonia, unspecified organism: Principal | ICD-10-CM

## 2019-04-18 DIAGNOSIS — I5042 Chronic combined systolic (congestive) and diastolic (congestive) heart failure: Secondary | ICD-10-CM

## 2019-04-18 DIAGNOSIS — D638 Anemia in other chronic diseases classified elsewhere: Secondary | ICD-10-CM

## 2019-04-18 DIAGNOSIS — I959 Hypotension, unspecified: Secondary | ICD-10-CM

## 2019-04-18 LAB — CBC
HCT: 31.7 % — ABNORMAL LOW (ref 36.0–46.0)
Hemoglobin: 10.1 g/dL — ABNORMAL LOW (ref 12.0–15.0)
MCH: 34.9 pg — ABNORMAL HIGH (ref 26.0–34.0)
MCHC: 31.9 g/dL (ref 30.0–36.0)
MCV: 109.7 fL — ABNORMAL HIGH (ref 80.0–100.0)
Platelets: 271 10*3/uL (ref 150–400)
RBC: 2.89 MIL/uL — ABNORMAL LOW (ref 3.87–5.11)
RDW: 13 % (ref 11.5–15.5)
WBC: 10.6 10*3/uL — ABNORMAL HIGH (ref 4.0–10.5)
nRBC: 0 % (ref 0.0–0.2)

## 2019-04-18 LAB — RENAL FUNCTION PANEL
Albumin: 3.2 g/dL — ABNORMAL LOW (ref 3.5–5.0)
Anion gap: 17 — ABNORMAL HIGH (ref 5–15)
BUN: 57 mg/dL — ABNORMAL HIGH (ref 8–23)
CO2: 22 mmol/L (ref 22–32)
Calcium: 9.6 mg/dL (ref 8.9–10.3)
Chloride: 97 mmol/L — ABNORMAL LOW (ref 98–111)
Creatinine, Ser: 7 mg/dL — ABNORMAL HIGH (ref 0.44–1.00)
GFR calc Af Amer: 6 mL/min — ABNORMAL LOW (ref >60)
GFR calc non Af Amer: 5 mL/min — ABNORMAL LOW (ref >60)
Glucose, Bld: 116 mg/dL — ABNORMAL HIGH (ref 70–99)
Phosphorus: 8.6 mg/dL — ABNORMAL HIGH (ref 2.5–4.6)
Potassium: 4.4 mmol/L (ref 3.5–5.1)
Sodium: 136 mmol/L (ref 135–145)

## 2019-04-18 LAB — PROCALCITONIN: Procalcitonin: 1.38 ng/mL

## 2019-04-18 LAB — MAGNESIUM: Magnesium: 2.5 mg/dL — ABNORMAL HIGH (ref 1.7–2.4)

## 2019-04-18 LAB — PROTIME-INR
INR: 2.8 — ABNORMAL HIGH (ref 0.8–1.2)
Prothrombin Time: 29.2 seconds — ABNORMAL HIGH (ref 11.4–15.2)

## 2019-04-18 MED ORDER — WARFARIN SODIUM 5 MG PO TABS
5.0000 mg | ORAL_TABLET | Freq: Once | ORAL | Status: AC
  Administered 2019-04-18: 5 mg via ORAL
  Filled 2019-04-18: qty 1

## 2019-04-18 NOTE — Progress Notes (Signed)
PROGRESS NOTE  KARENANN MCGRORY ZJQ:[id number] DOB: 12/29/1940 DOA: 04/08/2019 PCP: Redmond School, MD  Brief History: 78 y.o.femalewith medical history significant ofend-stage renal disease, permanent atrial fibrillation on chronic anticoagulation with Coumadin, chronicsystolic anddiastolic CHF, hypertension, GERD, presents to the hospital with chief complaint of significant shortness of breathx 5 days.She reports that it felt like she just could not take a deep breath in. Patient denies any chest pain, coughing, fever, dizziness, n/v/d.  ED course Chemistry shows a glucose of 140, BUN of 45, creatinine of 9.98, and GFR of 3. Patient has a BNP of 344. Initial troponin is 20. Hematology reveals no leukocytosis, but a macrocytic anemia with a hemoglobin of 11. Chest x-ray was done that shows enlargement of cardiac silhouette. Right lower lobe consolidation consistent with pneumonia. Blood cultures drawn. Lactic acid 1.6. Troponin stable at 22. Second lactic acid 1.3.  Assessment/Plan: Acute hypoxic respiratory failure with hypoxia due tolobar pneumoniaas well as pulmonary edema -Patient was admitted to stepdown and placed on supplemental oxygen.  -now weaned to RA -Nephrology follow up appreciated -Wheezing has improved -Continuepulmicort to 0.5 mg bid during hospitalization -pt endorsed some indiscretion with fluidsat home -1/16--Echo--EF 45%, mid inferior septal HK, PASP 21, mild TR/MR -finished9days abx during hospitalization  chronicsystolic and diastolicCHF -Given the decrease in EF, discontinueddiltiazem in Jan 2020 -08/22/18--Echo--EF 45%, mid inferior septal HK, PASP 21, mild TR/MR -continue metoprolol tartrate 25 mg bid -wt down from 106 to 97.7 Kg. -now appears euvolemic  ESRD -last HD 04/15/19--UF only -HD on 9/9 and 9/11 -appreciate nephrology -midodrine on HD to help maintain BP  Bronchospasm -Continue Pulmicort to 0.5 mg bid  -d/c prednisone -addedatrovent neb -improved -continue atrovent -addedxopenex  Atrial fibrillation, permanent, with RVR -This is likely in the setting of fluid overload, pneumonia, hypoxia -she was placed onesmololinfusion by cardiology-->weaned off 9/11 -HR up to 120s off esmolol, but partly due to metoprolol being held for HD -restart metoprolol -Continue Coumadin per pharmacy -rate control difficult due to low BPs for nearly one week -Appreciate cardiology consultation  -amiodarone drip started 04/17/19 -anticipate HR will continue to improve with improvement of her acute illness  Hypercalcemia -Hectorol on hold -per nephrology  Hypertension -Blood pressure remained stable/soft  Anemia of end-stage renal disease -Hemoglobin stable, no bleeding -continue ESA  GERD -Continue home PPI      Disposition Plan: Home 1-2 days when HR better controlled Family Communication: Family at bedside  Consultants:renal, cardiology  Code Status: FULL   DVT Prophylaxis:coumadin   Procedures: Femoral CVL--9/10>>>>  Antibiotics: vanc 9/2 Ceftriaxone 9/1 Cefepime 9/2>>>9/5 Amox/clav 9/6>>>9/10      Subjective: Patient denies fevers, chills, headache, chest pain, dyspnea, nausea, vomiting, diarrhea, abdominal pain, dysuria, hematuria, hematochezia, and melena.   Objective: Vitals:   04/18/19 1500 04/18/19 1511 04/18/19 1544 04/18/19 1600  BP: 140/82 127/72 138/78 123/77  Pulse: (!) 129 (!) 117 (!) 120 (!) 121  Resp: (!) 23 20  15   Temp:  97.7 F (36.5 C)    TempSrc:  Oral    SpO2: (!) 88% 98%  90%  Weight:  97.7 kg    Height:        Intake/Output Summary (Last 24 hours) at 04/18/2019 1635 Last data filed at 04/18/2019 1511 Gross per 24 hour  Intake 1080.03 ml  Output 2000 ml  Net -919.97 ml   Weight change: 1.1 kg Exam:   General:  Pt is alert, follows commands appropriately, not in acute  distress  HEENT: No icterus, No  thrush, No neck mass, [place]/AT  Cardiovascular: IRRR, S1/S2, no rubs, no gallops  Respiratory: fine bibasilar crackles, no wheeze  Abdomen: Soft/+BS, non tender, non distended, no guarding  Extremities: No edema, No lymphangitis, No petechiae, No rashes, no synovitis   Data Reviewed: I have personally reviewed following labs and imaging studies Basic Metabolic Panel: Recent Labs  Lab 04/14/19 0420 04/15/19 0434 04/16/19 0434 04/17/19 0402 04/18/19 0456  NA 140 140 137 137 136  K 4.1 4.1 4.0 3.7 4.4  CL 100 100 96* 98 97*  CO2 25 25 28 27 22   GLUCOSE 128* 127* 131* 114* 116*  BUN 44* 38* 38* 32* 57*  CREATININE 5.67* 4.87*  4.87* 4.60* 4.11* 7.00*  CALCIUM 10.6* 10.3 10.6* 9.8 9.6  MG  --   --   --   --  2.5*  PHOS 5.2* 4.8* 5.2* 5.1* 8.6*   Liver Function Tests: Recent Labs  Lab 04/14/19 0420 04/15/19 0434 04/16/19 0434 04/17/19 0402 04/18/19 0456  ALBUMIN 3.8 3.9 3.7 3.6 3.2*   No results for input(s): LIPASE, AMYLASE in the last 168 hours. No results for input(s): AMMONIA in the last 168 hours. Coagulation Profile: Recent Labs  Lab 04/14/19 0419 04/15/19 0434 04/16/19 0434 04/17/19 0402 04/18/19 0456  INR 2.6* 2.3* 2.2* 2.3* 2.8*   CBC: Recent Labs  Lab 04/14/19 1730 04/15/19 0434 04/16/19 0434 04/18/19 0456  WBC 12.1* 10.2 10.9* 10.6*  HGB 10.2* 10.2* 10.6* 10.1*  HCT 31.6* 31.9* 33.2* 31.7*  MCV 109.7* 109.6* 110.3* 109.7*  PLT 266 256 277 271   Cardiac Enzymes: No results for input(s): CKTOTAL, CKMB, CKMBINDEX, TROPONINI in the last 168 hours. BNP: Invalid input(s): POCBNP CBG: No results for input(s): GLUCAP in the last 168 hours. HbA1C: No results for input(s): HGBA1C in the last 72 hours. Urine analysis:    Component Value Date/Time   COLORURINE YELLOW 03/03/2012 1500   APPEARANCEUR CLEAR 03/03/2012 1500   LABSPEC 1.020 03/03/2012 1500   PHURINE 6.0 03/03/2012 1500   GLUCOSEU 100 (A) 03/03/2012 1500   HGBUR TRACE (A) 03/03/2012  1500   BILIRUBINUR NEGATIVE 03/03/2012 1500   KETONESUR NEGATIVE 03/03/2012 1500   PROTEINUR 100 (A) 03/03/2012 1500   UROBILINOGEN 0.2 03/03/2012 1500   NITRITE NEGATIVE 03/03/2012 1500   LEUKOCYTESUR NEGATIVE 03/03/2012 1500   Sepsis Labs: @LABRCNTIP (procalcitonin:4,lacticidven:4) ) Recent Results (from the past 240 hour(s))  SARS Coronavirus 2 [facility] order, Performed in [facility] hospital lab) Nasopharyngeal Nasopharyngeal Swab     Status: None   Collection Time: 04/08/19  5:50 PM   Specimen: Nasopharyngeal Swab  Result Value Ref Range Status   SARS Coronavirus 2 NEGATIVE NEGATIVE Final    Comment: (NOTE) If result is NEGATIVE SARS-CoV-2 target nucleic acids are NOT DETECTED. The SARS-CoV-2 RNA is generally detectable in upper and lower  respiratory specimens during the acute phase of infection. The lowest  concentration of SARS-CoV-2 viral copies this assay can detect is 250  copies / mL. A negative result does not preclude SARS-CoV-2 infection  and should not be used as the sole basis for treatment or other  patient management decisions.  A negative result may occur with  improper specimen collection / handling, submission of specimen other  than nasopharyngeal swab, presence of viral mutation(s) within the  areas targeted by this assay, and inadequate number of viral copies  (<250 copies / mL). A negative result must be combined with clinical  observations, patient history, and epidemiological  information. If result is POSITIVE SARS-CoV-2 target nucleic acids are DETECTED. The SARS-CoV-2 RNA is generally detectable in upper and lower  respiratory specimens dur ing the acute phase of infection.  Positive  results are indicative of active infection with SARS-CoV-2.  Clinical  correlation with patient history and other diagnostic information is  necessary to determine patient infection status.  Positive results do  not rule out bacterial infection or co-infection with  other viruses. If result is PRESUMPTIVE POSTIVE SARS-CoV-2 nucleic acids MAY BE PRESENT.   A presumptive positive result was obtained on the submitted specimen  and confirmed on repeat testing.  While 2019 novel coronavirus  (SARS-CoV-2) nucleic acids may be present in the submitted sample  additional confirmatory testing may be necessary for epidemiological  and / or clinical management purposes  to differentiate between  SARS-CoV-2 and other Sarbecovirus currently known to infect humans.  If clinically indicated additional testing with an alternate test  methodology [phone]) is advised. The SARS-CoV-2 RNA is generally  detectable in upper and lower respiratory sp ecimens during the acute  phase of infection. The expected result is Negative. Fact Sheet for Patients:  StrictlyIdeas.no Fact Sheet for Healthcare Providers: BankingDealers.co.za This test is not yet approved or cleared by the [place] FDA and has been authorized for detection and/or diagnosis of SARS-CoV-2 by FDA under an Emergency Use Authorization (EUA).  This EUA will remain in effect (meaning this test can be used) for the duration of the COVID-19 declaration under Section 564(b)(1) of the Act, 21 U.S.C. section 360bbb-3(b)(1), unless the authorization is terminated or revoked sooner. Performed at [facility], [address], [place], [place] [address]   Culture, blood (routine x 2)     Status: None   Collection Time: 04/08/19  6:21 PM   Specimen: BLOOD  Result Value Ref Range Status   Specimen Description BLOOD RIGHT ANTECUBITAL  Final   Special Requests   Final    BOTTLES DRAWN AEROBIC AND ANAEROBIC Blood Culture adequate volume   Culture   Final    NO GROWTH 5 DAYS Performed at [facility], [address] [place], [place] [address]    Report Status 04/13/2019 FINAL  Final  Culture, blood (routine x 2)     Status: None   Collection Time: 04/08/19  6:22 PM    Specimen: BLOOD  Result Value Ref Range Status   Specimen Description BLOOD RIGHT ANTECUBITAL  Final   Special Requests   Final    BOTTLES DRAWN AEROBIC AND ANAEROBIC Blood Culture adequate volume   Culture   Final    NO GROWTH 5 DAYS Performed at [facility], [address] [place], [place] [address]    Report Status 04/13/2019 FINAL  Final  MRSA PCR Screening     Status: None   Collection Time: 04/09/19  9:45 PM   Specimen: Nasopharyngeal  Result Value Ref Range Status   MRSA by PCR NEGATIVE NEGATIVE Final    Comment:        The GeneXpert MRSA Assay (FDA approved for NASAL specimens only), is one component of a comprehensive MRSA colonization surveillance program. It is not intended to diagnose MRSA infection nor to guide or monitor treatment for MRSA infections. Performed at [facility], [address] [place], [place] [address]   Culture, sputum-assessment     Status: None   Collection Time: 04/13/19  9:30 AM   Specimen: Sputum  Result Value Ref Range Status   Specimen Description SPUTUM  Final  Special Requests NONE  Final   Sputum evaluation   Final    Sputum specimen not acceptable for testing.  Please recollect.   NOTIFIED L. HILTON,RN @1014   09/06/2020KAY  SPECIMEN TO BE RECOLLECTED Performed at [facility], [address] [place], [place] [address]    Report Status 04/13/2019 FINAL  Final  MRSA PCR Screening     Status: None   Collection Time: 04/14/19  2:38 PM   Specimen: Nasal Mucosa; Nasopharyngeal  Result Value Ref Range Status   MRSA by PCR NEGATIVE NEGATIVE Final    Comment:        The GeneXpert MRSA Assay (FDA approved for NASAL specimens only), is one component of a comprehensive MRSA colonization surveillance program. It is not intended to diagnose MRSA infection nor to guide or monitor treatment for MRSA infections. Performed at [facility], [address], [place], [place] [address]      Scheduled Meds: . budesonide (PULMICORT)  nebulizer solution  0.5 mg Nebulization BID  . Chlorhexidine Gluconate Cloth  6 each Topical Daily  . Chlorhexidine Gluconate Cloth  6 each Topical Q0600  . Chlorhexidine Gluconate Cloth  6 each Topical Q0600  . epoetin (EPOGEN/PROCRIT) injection  2,800 Units Intravenous Q M,W,F-HD  . gabapentin  100 mg Oral BID  . ipratropium  0.5 mg Nebulization TID  . levalbuterol  1.25 mg Nebulization TID  . mouth rinse  15 mL Mouth Rinse BID  . metoprolol tartrate  25 mg Oral BID  . midodrine  5 mg Oral Q M,W,F-HD  . pantoprazole  40 mg Oral Daily  . predniSONE  20 mg Oral Q breakfast  . sevelamer carbonate  2,400 mg Oral TID WC  . sodium chloride flush  10-40 mL Intracatheter Q12H  . warfarin  5 mg Oral Once  . Warfarin - Pharmacist Dosing Inpatient   Does not apply q1800   Continuous Infusions: . sodium chloride    . sodium chloride    . amiodarone 30 mg/hr (04/18/19 1428)  . esmolol Stopped (04/18/19 1143)    Procedures/Studies: Dg Chest 2 View  Result Date: 04/10/2019 CLINICAL DATA:  Shortness of breath. EXAM: CHEST - 2 VIEW COMPARISON:  04/08/2019 and 08/18/2018 FINDINGS: Cardiac size is within normal limits. Aortic atherosclerosis. New pulmonary vascular congestion. New bilateral perihilar infiltrates. Persistent hazy infiltrate at the right lung base posteriorly. Minimal blunting of the posterior costophrenic angles. No acute bone abnormality. IMPRESSION: New bilateral perihilar infiltrates. Persistent infiltrate at the right lung base. This could represent pulmonary edema or multifocal progressive pneumonia. Aortic Atherosclerosis (ICD10-I70.0). Electronically Signed   By: Lorriane Shire M.D.   On: 04/10/2019 11:04   Ct Head Wo Contrast  Result Date: 04/11/2019 CLINICAL DATA:  Transient ischemic attack.  Slurred speech. EXAM: CT HEAD WITHOUT CONTRAST TECHNIQUE: Contiguous axial images were obtained from the base of the skull through the vertex without intravenous contrast. COMPARISON:  CT  scan of September 05, 2014. FINDINGS: Brain: No evidence of acute infarction, hemorrhage, hydrocephalus, extra-axial collection or mass lesion/mass effect. Vascular: No hyperdense vessel or unexpected calcification. Skull: Normal. Negative for fracture or focal lesion. Sinuses/Orbits: No acute finding. Other: None. IMPRESSION: No acute intracranial abnormality seen. Electronically Signed   By: Marijo Conception M.D.   On: 04/11/2019 12:08   Dg Chest Port 1 View  Result Date: 04/15/2019 CLINICAL DATA:  Shortness of breath. EXAM: PORTABLE CHEST 1 VIEW COMPARISON:  Radiographs of April 13, 2019. FINDINGS: Stable cardiomegaly. No pneumothorax or definite pleural effusion  is noted. Stable bilateral lung opacities are noted concerning for edema or inflammation. Bony thorax is unremarkable. IMPRESSION: Stable bilateral lung opacities as described above. Electronically Signed   By: Marijo Conception M.D.   On: 04/15/2019 07:31   Dg Chest Port 1 View  Result Date: 04/13/2019 CLINICAL DATA:  Shortness of breath and hypoxia in a patient with a history of end-stage renal disease. EXAM: PORTABLE CHEST 1 VIEW COMPARISON:  Single-view of the chest 04/11/2019. PA and lateral chest 04/10/2019. FINDINGS: Bilateral airspace disease has improved but there is persistent focal airspace opacity in the right mid and lower lung zones. There is cardiomegaly. No pneumothorax. Small bilateral pleural effusions. IMPRESSION: Persistent but improved bilateral airspace disease compatible with decreased pulmonary edema. More focal airspace opacity in the right midlung could be due to edema or pneumonia. Electronically Signed   By: Inge Rise M.D.   On: 04/13/2019 09:08   Dg Chest Port 1 View  Result Date: 04/11/2019 CLINICAL DATA:  Increasing shortness of breath following dialysis. EXAM: PORTABLE CHEST 1 VIEW COMPARISON:  04/10/2019. FINDINGS: Poor inspiration. Progressive enlargement of the cardiac silhouette with increased prominence  of the pulmonary vasculature interstitial markings. Progressive bilateral airspace opacities, greater on the right. No definite pleural fluid seen. Unremarkable bones. IMPRESSION: 1. Progressive cardiomegaly and changes of congestive heart failure. 2. Progressive bilateral alveolar edema or pneumonia, greater on the right. Electronically Signed   By: Claudie Revering M.D.   On: 04/11/2019 19:54   Dg Chest Port 1 View  Result Date: 04/08/2019 CLINICAL DATA:  Difficulty breathing for past few days, dyspnea on exertion, denies fever, history atrial fibrillation, heart failure, end-stage renal disease on dialysis, systemic hypertension, uterine cancer EXAM: PORTABLE CHEST 1 VIEW COMPARISON:  Portable exam 1645 hours compared to 08/18/2018 FINDINGS: Enlargement of cardiac silhouette. Mediastinal contours and pulmonary vascularity normal. Atherosclerotic calcification aorta. RIGHT lower lobe consolidation consistent with pneumonia. Remaining lungs clear. No pleural effusion or pneumothorax. Bones demineralized. IMPRESSION: Enlargement of cardiac silhouette. RIGHT lower lobe consolidation consistent with pneumonia. Electronically Signed   By: Lavonia Dana M.D.   On: 04/08/2019 17:18    Orson Eva, DO  [facility] Pager [phone]  If 7PM-7AM, please contact night-coverage [email or web address] Password [facility] 04/18/2019, 4:35 PM   LOS: 8 days

## 2019-04-18 NOTE — Care Management Important Message (Signed)
Important Message  Patient Details  Name: Sabrina Beck MRN: [id number] Date of Birth: [birth year]   Medicare Important Message Given:  Yes     Tommy Medal 04/18/2019, 1:17 PM

## 2019-04-18 NOTE — TOC Progression Note (Signed)
Transition of Care [facility]) - Progression Note    Patient Details  Name: Sabrina Beck MRN: [id number] Date of Birth: [birth year]  Transition of Care [facility] CM/SW Contact  Ihor Gully, LCSW Phone Number: 04/18/2019, 4:37 PM  Clinical Narrative:    Patient states that she has not spoken to her sons about the option of HH.  TOC will continue to follow and address needs.    Expected Discharge Plan: [place] Barriers to Discharge: No Barriers Identified  Expected Discharge Plan and Services Expected Discharge Plan: [place]                                               Social Determinants of Health (SDOH) Interventions    Readmission Risk Interventions Readmission Risk Prevention Plan 04/15/2019 04/11/2019 04/10/2019  Transportation Screening Complete - Complete  PCP or Specialist Appt within 3-5 Days Complete Complete -  HRI or Home Care Consult Not Complete - -  Social Work Consult for Stem Planning/Counseling Complete - Complete  Palliative Care Screening Not Complete - Not Applicable  Medication Review Press photographer) Complete - Complete  Some recent data might be hidden

## 2019-04-18 NOTE — Progress Notes (Signed)
Patient ID: Sabrina Beck, female   DOB: 1941-07-29, 78 y.o.   MRN: [id number] S: Feels better today, no SOB O:BP (!) 88/64   Pulse 93   Temp 97.6 F (36.4 C) (Oral)   Resp 15   Ht 5\' 4"  (1.626 m)   Wt 98.8 kg   SpO2 91%   BMI 37.39 kg/m   Intake/Output Summary (Last 24 hours) at 04/18/2019 0949 Last data filed at 04/18/2019 4627 Gross per 24 hour  Intake 3092.86 ml  Output -  Net 3092.86 ml   Intake/Output: I/O last 3 completed shifts: In: 2144.4 [I.V.:2144.4] Out: -   Intake/Output this shift:  Total I/O In: 948.5 [I.V.:948.5] Out: -  Weight change: 1.1 kg Gen: elderly, frail, chronically ill-appearing WF in NAD CVS: irr irr no rub Resp:cta Abd: +BS, soft, NT Ext: no edema, LAVF +T/B  Recent Labs  Lab 04/12/19 0348 04/13/19 0410 04/14/19 0420 04/15/19 0434 04/16/19 0434 04/17/19 0402 04/18/19 0456  NA 136 136 140 140 137 137 136  K 4.2 4.6 4.1 4.1 4.0 3.7 4.4  CL 95* 97* 100 100 96* 98 97*  CO2 28 25 25 25 28 27 22   GLUCOSE 154* 165* 128* 127* 131* 114* 116*  BUN 25* 52* 44* 38* 38* 32* 57*  CREATININE 5.08* 7.28* 5.67* 4.87*  4.87* 4.60* 4.11* 7.00*  ALBUMIN 4.0 3.8 3.8 3.9 3.7 3.6 3.2*  CALCIUM 9.8 10.3 10.6* 10.3 10.6* 9.8 9.6  PHOS 6.1* 5.5* 5.2* 4.8* 5.2* 5.1* 8.6*   Liver Function Tests: Recent Labs  Lab 04/16/19 0434 04/17/19 0402 04/18/19 0456  ALBUMIN 3.7 3.6 3.2*   No results for input(s): LIPASE, AMYLASE in the last 168 hours. No results for input(s): AMMONIA in the last 168 hours. CBC: Recent Labs  Lab 04/14/19 1730 04/15/19 0434 04/16/19 0434 04/18/19 0456  WBC 12.1* 10.2 10.9* 10.6*  HGB 10.2* 10.2* 10.6* 10.1*  HCT 31.6* 31.9* 33.2* 31.7*  MCV 109.7* 109.6* 110.3* 109.7*  PLT 266 256 277 271   Cardiac Enzymes: No results for input(s): CKTOTAL, CKMB, CKMBINDEX, TROPONINI in the last 168 hours. CBG: Recent Labs  Lab 04/11/19 1131  GLUCAP 101*    Iron Studies: No results for input(s): IRON, TIBC, TRANSFERRIN, FERRITIN  in the last 72 hours. Studies/Results: No results found. . budesonide (PULMICORT) nebulizer solution  0.5 mg Nebulization BID  . Chlorhexidine Gluconate Cloth  6 each Topical Daily  . Chlorhexidine Gluconate Cloth  6 each Topical Q0600  . Chlorhexidine Gluconate Cloth  6 each Topical Q0600  . epoetin (EPOGEN/PROCRIT) injection  2,800 Units Intravenous Q M,W,F-HD  . gabapentin  100 mg Oral BID  . ipratropium  0.5 mg Nebulization TID  . levalbuterol  1.25 mg Nebulization TID  . mouth rinse  15 mL Mouth Rinse BID  . metoprolol tartrate  25 mg Oral BID  . midodrine  5 mg Oral Q M,W,F-HD  . pantoprazole  40 mg Oral Daily  . predniSONE  20 mg Oral Q breakfast  . sevelamer carbonate  2,400 mg Oral TID WC  . sodium chloride flush  10-40 mL Intracatheter Q12H  . warfarin  5 mg Oral Once  . Warfarin - Pharmacist Dosing Inpatient   Does not apply q1800    BMET    Component Value Date/Time   NA 136 04/18/2019 0456   NA 142 06/08/2015 0735   K 4.4 04/18/2019 0456   CL 97 (L) 04/18/2019 0456   CO2 22 04/18/2019 0456   GLUCOSE 116 (  H) 04/18/2019 0456   BUN 57 (H) 04/18/2019 0456   BUN 37 (H) 06/08/2015 0735   CREATININE 7.00 (H) 04/18/2019 0456   CALCIUM 9.6 04/18/2019 0456   CALCIUM 8.1 (L) 01/11/2012 1039   GFRNONAA 5 (L) 04/18/2019 0456   GFRAA 6 (L) 04/18/2019 0456   CBC    Component Value Date/Time   WBC 10.6 (H) 04/18/2019 0456   RBC 2.89 (L) 04/18/2019 0456   HGB 10.1 (L) 04/18/2019 0456   HCT 31.7 (L) 04/18/2019 0456   HCT 28.8 (L) 04/09/2019 0558   PLT 271 04/18/2019 0456   MCV 109.7 (H) 04/18/2019 0456   MCH 34.9 (H) 04/18/2019 0456   MCHC 31.9 04/18/2019 0456   RDW 13.0 04/18/2019 0456   LYMPHSABS 1.4 04/09/2019 0558   MONOABS 0.7 04/09/2019 0558   EOSABS 0.1 04/09/2019 0558   BASOSABS 0.1 04/09/2019 0558     [id number] Bath 2K/2.5CaTime 3.5 hrsHeparin none. AccessLAVFBFR 400DFR 600  Hectoral 9.16mcg  IV/HD Epogen 2,800Units IV/HD   Assessment/Plan: 1. HCAP- finished 8 days of antibiotics during hospitalization and now on room air.. 2. A Fib with RVR- rate controlled with esmolol but BP is low.  Discussed with cardiology and will titrate esmolol.   3. ESRD- She has had daily HD with improvement of her pulmonary edema.  Off yesterday and will plan on HD today to keep on schedule.  Will continue with midodrine prior to HD to help maintain BP and help with UF as well as IV albumin but will need to decrease esmolol to help with low BP. 4. Hypertension/volume- has improved with daily dialysis. Plan for uf as bp tolerates today and weigh standing up if able 5. Anemia- Cont with ESA and follow. Hgbcontinues to drop. Guaiac stools and follow h/h. 1. Will check cbc prior to HD today. 6. Metabolic bone disease with hypercalcemia- hectoral on hold and Ca has improved.  Her iPTH was 154 so likely too high of a dose of vit D. F/u with outpatient HD unit and cont to hold vit D for now.  7. Nutrition- renal diet  Donetta Potts, MD [facility], [place] [phone]

## 2019-04-18 NOTE — Progress Notes (Signed)
[place] for Coumadin Indication: atrial fibrillation  No Known Allergies  Patient Measurements: Height: 5\' 4"  (162.6 cm) Weight: 217 lb 13 oz (98.8 kg) IBW/kg (Calculated) : 54.7  Vital Signs: Temp: 97.6 F (36.4 C) (09/11 0400) Temp Source: Oral (09/11 0400) BP: 101/57 (09/11 0700) Pulse Rate: 64 (09/11 0700)  Labs: Recent Labs    04/16/19 0434 04/17/19 0402 04/18/19 0456  HGB 10.6*  --  10.1*  HCT 33.2*  --  31.7*  PLT 277  --  271  LABPROT 24.3* 24.8* 29.2*  INR 2.2* 2.3* 2.8*  CREATININE 4.60* 4.11* 7.00*    Estimated Creatinine Clearance: 7.4 mL/min (A) (by C-G formula based on SCr of 7 mg/dL (H)).    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 She presented to the ED with shortness of breath. Patient had been getting progressively short of breath over 5 days. Patient chronically anticoagulated with warfarin    Home dose:  10mg  on Monday and 7.5mg  all other days. Drug Interactions: amiodarone  INR is 2.8 today, so will not give home dose of 7.5mg , but will back off due to 0.5 jump in INR since yesterday.   Goal of Therapy:  INR 2-3 Monitor platelets by anticoagulation protocol: Yes  Plan:  Give Coumadin 5 mg po x 1 today Daily PT-INR Monitor for s/s of bleeding  [name], Pharm. D. Clinical Pharmacist 04/18/2019 8:54 AM

## 2019-04-18 NOTE — Progress Notes (Addendum)
Progress Note  Patient Name: Sabrina Beck Date of Encounter: 04/18/2019  Primary Cardiologist: Sanda Klein, MD   Subjective   No chest pain and breathing improved HR controlled  Inpatient Medications    Scheduled Meds: . budesonide (PULMICORT) nebulizer solution  0.5 mg Nebulization BID  . Chlorhexidine Gluconate Cloth  6 each Topical Daily  . Chlorhexidine Gluconate Cloth  6 each Topical Q0600  . Chlorhexidine Gluconate Cloth  6 each Topical Q0600  . epoetin (EPOGEN/PROCRIT) injection  2,800 Units Intravenous Q M,W,F-HD  . gabapentin  100 mg Oral BID  . ipratropium  0.5 mg Nebulization TID  . levalbuterol  1.25 mg Nebulization TID  . mouth rinse  15 mL Mouth Rinse BID  . metoprolol tartrate  25 mg Oral BID  . midodrine  5 mg Oral Q M,W,F-HD  . pantoprazole  40 mg Oral Daily  . predniSONE  20 mg Oral Q breakfast  . sevelamer carbonate  2,400 mg Oral TID WC  . sodium chloride flush  10-40 mL Intracatheter Q12H  . warfarin  5 mg Oral Once  . Warfarin - Pharmacist Dosing Inpatient   Does not apply q1800   Continuous Infusions: . sodium chloride    . sodium chloride    . amiodarone 30 mg/hr (04/18/19 0924)  . esmolol 75 mcg/kg/min (04/18/19 0907)   PRN Meds: sodium chloride, sodium chloride, acetaminophen, albuterol, lidocaine (PF), lidocaine-prilocaine, ondansetron (ZOFRAN) IV, pentafluoroprop-tetrafluoroeth, sodium chloride flush   Vital Signs    Vitals:   04/18/19 0743 04/18/19 0800 04/18/19 0900 04/18/19 0901  BP:  98/61 (!) 89/64 (!) 88/64  Pulse:  (!) 101 92 93  Resp:  16 15   Temp:      TempSrc:      SpO2: 96% 96% 91%   Weight:      Height:        Intake/Output Summary (Last 24 hours) at 04/18/2019 1037 Last data filed at 04/18/2019 0907 Gross per 24 hour  Intake 3092.86 ml  Output -  Net 3092.86 ml   Last 3 Weights 04/18/2019 04/17/2019 04/16/2019  Weight (lbs) 217 lb 13 oz 215 lb 6.2 oz 215 lb 6.2 oz  Weight (kg) 98.8 kg 97.7 kg 97.7 kg       Telemetry    A fib with rate control - Personally Reviewed  ECG    No new - Personally Reviewed  Physical Exam   GEN: No acute distress.   Neck: No JVD Cardiac: irreg irreg, no murmurs, rubs, or gallops.  Respiratory: Clear to auscultation bilaterally. GI: Soft, nontender, non-distended  MS: No edema; No deformity. Neuro:  Nonfocal  Psych: Normal affect   Labs    High Sensitivity Troponin:   Recent Labs  Lab 04/08/19 1600 04/08/19 1822  TROPONINIHS 20* 22*      Chemistry Recent Labs  Lab 04/16/19 0434 04/17/19 0402 04/18/19 0456  NA 137 137 136  K 4.0 3.7 4.4  CL 96* 98 97*  CO2 28 27 22   GLUCOSE 131* 114* 116*  BUN 38* 32* 57*  CREATININE 4.60* 4.11* 7.00*  CALCIUM 10.6* 9.8 9.6  ALBUMIN 3.7 3.6 3.2*  GFRNONAA 8* 10* 5*  GFRAA 10* 11* 6*  ANIONGAP 13 12 17*     Hematology Recent Labs  Lab 04/15/19 0434 04/16/19 0434 04/18/19 0456  WBC 10.2 10.9* 10.6*  RBC 2.91* 3.01* 2.89*  HGB 10.2* 10.6* 10.1*  HCT 31.9* 33.2* 31.7*  MCV 109.6* 110.3* 109.7*  MCH 35.1* 35.2* 34.9*  MCHC 32.0 31.9 31.9  RDW 13.2 13.2 13.0  PLT 256 277 271    BNPNo results for input(s): BNP, PROBNP in the last 168 hours.   DDimer No results for input(s): DDIMER in the last 168 hours.   Radiology    No results found.  Cardiac Studies   Echo 08/22/18 Study Conclusions  - Left ventricle: The cavity size was normal. Wall thickness was increased in a pattern of moderate LVH. The estimated ejection fraction was 45%. There is hypokinesis of the midinferoseptal myocardium. The study was not technically sufficient to allow evaluation of LV diastolic dysfunction due to atrial fibrillation. - Aortic valve: Mildly calcified annulus. Trileaflet; mildly calcified leaflets. - Mitral valve: Mildly calcified annulus. There was mild regurgitation. - Left atrium: The atrium was moderately dilated. - Right atrium: The atrium was mildly to moderately dilated. -  Atrial septum: No defect or patent foramen ovale was identified. - Tricuspid valve: There was mild regurgitation. - Pulmonary arteries: PA peak pressure: 21 mm Hg (S). - Pericardium, extracardiac: There was no pericardial effusion.   Patient Profile     78 y.o. female with a hx of permanent atrial fib, anticoagulation on coumadin, HTN, ESRD on HD, EF 45% in Jan with acute illness, now admitted with PNA and A fib RVR  Assessment & Plan    1. Atrial fib with RVR on lopressor 25 BID at home was on 50 mg BID, BP is elevated as well. May need Increase to 50 mg BID but with soft BP with dialysis this is difficult. .  Placed on esmolol. HR now in 80-90sin her permanent a fib. now rate controlled will wean esmolol is on IV amiodarone.   2. anticoagulation on coumadin. INR 2.8.  3. PNA per IM on antibiotics  4. ESRD on HD MWF. Did have dialysis yesterday.  5. Chronic diastolic and systolic HF last EF in Jan was 45%. Mod LVH. Possible recheckthis admit.Neg 3929 - hd been neg 7,022 since admit.  wt down from 106 to 98.8  Kg.continued improvement will improve HR as well.  6. HTN though soft BPs with dialysis. Plan for midodrineprior to dialysis. now BP 91/59 similar to yesterday. Still drops in dialysis to 80s  7. Anemia of chronic disease per IM and Renal.Hgb 10.6      For questions or updates, please contact [place] Please consult [email or web address] for contact info under        Signed, Cecilie Kicks, NP  04/18/2019, 10:37 AM    The patient was seen and examined, and I agree with the history, physical exam, assessment and plan as documented above.  She is feeling better today with respect to shortness of breath. On hemodialysis at present, family member present. Will wean off IV esmolol. Rates better controlled today. Also on Lopressor and IV amiodarone. On warfarin. Receiving midodrine prior to HD. Hgb stable today at 10.1. Spoke with nephrology regarding plan.   Kate Sable, MD, [facility]  04/18/2019 11:24 AM

## 2019-04-19 ENCOUNTER — Inpatient Hospital Stay (HOSPITAL_COMMUNITY): Payer: Medicare HMO

## 2019-04-19 LAB — CBC
HCT: 36 % (ref 36.0–46.0)
HCT: 35.6 % — ABNORMAL LOW (ref 36.0–46.0)
Hemoglobin: 11.4 g/dL — ABNORMAL LOW (ref 12.0–15.0)
Hemoglobin: 11.4 g/dL — ABNORMAL LOW (ref 12.0–15.0)
MCH: 35.1 pg — ABNORMAL HIGH (ref 26.0–34.0)
MCH: 35.3 pg — ABNORMAL HIGH (ref 26.0–34.0)
MCHC: 31.7 g/dL (ref 30.0–36.0)
MCHC: 32 g/dL (ref 30.0–36.0)
MCV: 110.8 fL — ABNORMAL HIGH (ref 80.0–100.0)
MCV: 110.2 fL — ABNORMAL HIGH (ref 80.0–100.0)
Platelets: 318 10*3/uL (ref 150–400)
Platelets: 294 10*3/uL (ref 150–400)
RBC: 3.25 MIL/uL — ABNORMAL LOW (ref 3.87–5.11)
RBC: 3.23 MIL/uL — ABNORMAL LOW (ref 3.87–5.11)
RDW: 13.2 % (ref 11.5–15.5)
RDW: 13.4 % (ref 11.5–15.5)
WBC: 21.3 10*3/uL — ABNORMAL HIGH (ref 4.0–10.5)
WBC: 23.2 10*3/uL — ABNORMAL HIGH (ref 4.0–10.5)
nRBC: 0 % (ref 0.0–0.2)
nRBC: 0 % (ref 0.0–0.2)

## 2019-04-19 LAB — RENAL FUNCTION PANEL
Albumin: 3.4 g/dL — ABNORMAL LOW (ref 3.5–5.0)
Anion gap: 14 (ref 5–15)
BUN: 35 mg/dL — ABNORMAL HIGH (ref 8–23)
CO2: 27 mmol/L (ref 22–32)
Calcium: 10 mg/dL (ref 8.9–10.3)
Chloride: 95 mmol/L — ABNORMAL LOW (ref 98–111)
Creatinine, Ser: 4.89 mg/dL — ABNORMAL HIGH (ref 0.44–1.00)
GFR calc Af Amer: 9 mL/min — ABNORMAL LOW (ref >60)
GFR calc non Af Amer: 8 mL/min — ABNORMAL LOW (ref >60)
Glucose, Bld: 124 mg/dL — ABNORMAL HIGH (ref 70–99)
Phosphorus: 6.5 mg/dL — ABNORMAL HIGH (ref 2.5–4.6)
Potassium: 4.6 mmol/L (ref 3.5–5.1)
Sodium: 136 mmol/L (ref 135–145)

## 2019-04-19 LAB — PROCALCITONIN: Procalcitonin: 3.14 ng/mL

## 2019-04-19 LAB — PROTIME-INR
INR: 2.6 — ABNORMAL HIGH (ref 0.8–1.2)
Prothrombin Time: 27.7 seconds — ABNORMAL HIGH (ref 11.4–15.2)

## 2019-04-19 MED ORDER — POLYETHYLENE GLYCOL 3350 17 G PO PACK
17.0000 g | PACK | Freq: Every day | ORAL | Status: DC
  Administered 2019-04-20 – 2019-04-23 (×3): 17 g via ORAL
  Filled 2019-04-19 (×5): qty 1

## 2019-04-19 MED ORDER — WARFARIN SODIUM 7.5 MG PO TABS
7.5000 mg | ORAL_TABLET | Freq: Once | ORAL | Status: AC
  Administered 2019-04-19: 7.5 mg via ORAL
  Filled 2019-04-19: qty 1

## 2019-04-19 MED ORDER — VANCOMYCIN HCL 10 G IV SOLR
1500.0000 mg | Freq: Once | INTRAVENOUS | Status: AC
  Administered 2019-04-19: 1500 mg via INTRAVENOUS
  Filled 2019-04-19: qty 1500

## 2019-04-19 MED ORDER — PIPERACILLIN-TAZOBACTAM 3.375 G IVPB
3.3750 g | Freq: Two times a day (BID) | INTRAVENOUS | Status: AC
  Administered 2019-04-19 – 2019-04-25 (×12): 3.375 g via INTRAVENOUS
  Filled 2019-04-19 (×12): qty 50

## 2019-04-19 MED ORDER — VANCOMYCIN HCL 10 G IV SOLR
1500.0000 mg | Freq: Once | INTRAVENOUS | Status: DC
  Filled 2019-04-19 (×2): qty 1500

## 2019-04-19 MED ORDER — METOPROLOL TARTRATE 25 MG PO TABS
12.5000 mg | ORAL_TABLET | Freq: Once | ORAL | Status: AC
  Administered 2019-04-19: 12.5 mg via ORAL
  Filled 2019-04-19: qty 1

## 2019-04-19 MED ORDER — PIPERACILLIN-TAZOBACTAM IN DEX 2-0.25 GM/50ML IV SOLN
2.2500 g | Freq: Three times a day (TID) | INTRAVENOUS | Status: DC
  Filled 2019-04-19 (×9): qty 50

## 2019-04-19 MED ORDER — IOHEXOL 300 MG/ML  SOLN: 30.0000 mL | Freq: Once | INTRAMUSCULAR | Status: DC | PRN

## 2019-04-19 MED ORDER — PIPERACILLIN-TAZOBACTAM IN DEX 2-0.25 GM/50ML IV SOLN: 2.2500 g | Freq: Three times a day (TID) | INTRAVENOUS | Status: DC

## 2019-04-19 MED ORDER — METOPROLOL TARTRATE 25 MG PO TABS
37.5000 mg | ORAL_TABLET | Freq: Two times a day (BID) | ORAL | Status: DC
  Administered 2019-04-19 – 2019-04-20 (×3): 37.5 mg via ORAL
  Filled 2019-04-19 (×3): qty 2

## 2019-04-19 MED ORDER — SENNA 8.6 MG PO TABS
2.0000 | ORAL_TABLET | Freq: Every day | ORAL | Status: DC
  Administered 2019-04-19 – 2019-04-23 (×4): 17.2 mg via ORAL
  Filled 2019-04-19 (×5): qty 2

## 2019-04-19 NOTE — Progress Notes (Signed)
PROGRESS NOTE  Sabrina Beck JXB:[id number] DOB: 10/14/1940 DOA: 04/08/2019 PCP: Redmond School, MD  Brief History: 78 y.o.femalewith medical history significant ofend-stage renal disease, permanent atrial fibrillation on chronic anticoagulation with Coumadin, chronicsystolic anddiastolic CHF, hypertension, GERD, presents to the hospital with chief complaint of significant shortness of breathx 5 days.She reports that it felt like she just could not take a deep breath in. Patient denied any chest pain, coughing, fever, dizziness, n/v/d.  ED course Chemistry shows a glucose of 140, BUN of 45, creatinine of 9.98, and GFR of 3. Patient has a BNP of 344. Initial troponin is 20. Hematology reveals no leukocytosis, but a macrocytic anemia with a hemoglobin of 11. Chest x-ray was done that shows enlargement of cardiac silhouette. Right lower lobe consolidation consistent with pneumonia. Blood cultures drawn. Lactic acid 1.6. Troponin stable at 22. Second lactic acid 1.3.  On 04/19/19, the patient's WBC increased, but she remained afebrile and hemodynamically stable.  Blood cultures x 2 sets were repeated and pt was restarted on zosyn and vanco empirically.  Assessment/Plan: Acute hypoxic respiratory failure with hypoxia due tolobar pneumoniaas well as pulmonary edema -Patient was admitted to stepdown and placed on supplemental oxygen.  -now weaned to RA -Nephrologyfollow up appreciated -Wheezing has improved -Continuepulmicort to 0.5 mg bid during hospitalization -pt endorsed some indiscretion with fluidsat home -1/16--Echo--EF 45%, mid inferior septal HK, PASP 21, mild TR/MR -finished9days abx during hospitalization  Leukocytosis -04/19/19 WBC went up -check procalcitonin -CT abd/pelvis as pt c/o abd pain -blood cultures x 2 sets -empiric vanc and zosyn  chronicsystolic and diastolicCHF -Given the decrease in EF, discontinueddiltiazem in Jan 2020  -08/22/18--Echo--EF 45%, mid inferior septal HK, PASP 21, mild TR/MR -continue metoprolol tartrate 25 mg bid -wt down from 106 to 97.7 Kg. -now appears euvolemic  ESRD -last HD 04/15/19--UF only -HD on 9/9 and 9/11 -appreciate nephrology -midodrine on HD to help maintain BP  Bronchospasm -Continue Pulmicort to 0.5 mg bid -d/c prednisone -addedatrovent neb -improved -continue atrovent -addedxopenex  Atrial fibrillation, permanent, with RVR -This is likely in the setting of fluid overload, pneumonia, hypoxia -she was placed onesmololinfusion by cardiology-->weaned off 9/11 -HR up to 120s off esmolol, but partly due to metoprolol being held for HD -restarted metoprolol--increase to 37.5 mg bid -Continue Coumadin per pharmacy -rate control difficult due to low BPs for nearly one week -Appreciate cardiology consultation -amiodarone drip started 04/17/19 -anticipate HR will continue to improve with improvement of her acute illness  Hypercalcemia -Hectorol on hold -per nephrology  Hypertension -Blood pressure remained stable/soft  Anemia of end-stage renal disease -Hemoglobin stable, no bleeding -continue ESA  GERD -Continue home PPI      Disposition Plan: Home 1-2 days when HR better controlled Family Communication: NoFamily at bedside  Consultants:renal, cardiology  Code Status: FULL   DVT Prophylaxis:coumadin   Procedures: Femoral CVL--9/10>>>>  Antibiotics: vanc 9/2 Ceftriaxone 9/1 Cefepime 9/2>>>9/5 Amox/clav 9/6>>>9/10      Subjective: Patient denies fevers, chills, headache, chest pain, dyspnea, nausea, vomiting, diarrhea,  dysuria, hematuria, hematochezia, and melena.  She has periumbilical pain but no BM in 2 days.   Objective: Vitals:   04/19/19 0800 04/19/19 0806 04/19/19 1445 04/19/19 1600  BP: 126/73     Pulse: 100     Resp:      Temp:    98.4 F (36.9 C)  TempSrc:    Oral  SpO2: 94% 100% 93%    Weight:  Height:        Intake/Output Summary (Last 24 hours) at 04/19/2019 1712 Last data filed at 04/19/2019 0831 Gross per 24 hour  Intake 252.26 ml  Output -  Net 252.26 ml   Weight change: 1.1 kg Exam:   General:  Pt is alert, follows commands appropriately, not in acute distress  HEENT: No icterus, No thrush, No neck mass, [place]/AT  Cardiovascular: RRR, S1/S2, no rubs, no gallops  Respiratory:fine basilar crackles. No wheeze  Abdomen: Soft/+BS, periumbilical tender, non distended, no guarding  Extremities: No edema, No lymphangitis, No petechiae, No rashes, no synovitis   Data Reviewed: I have personally reviewed following labs and imaging studies Basic Metabolic Panel: Recent Labs  Lab 04/15/19 0434 04/16/19 0434 04/17/19 0402 04/18/19 0456 04/19/19 0522  NA 140 137 137 136 136  K 4.1 4.0 3.7 4.4 4.6  CL 100 96* 98 97* 95*  CO2 25 28 27 22 27   GLUCOSE 127* 131* 114* 116* 124*  BUN 38* 38* 32* 57* 35*  CREATININE 4.87*  4.87* 4.60* 4.11* 7.00* 4.89*  CALCIUM 10.3 10.6* 9.8 9.6 10.0  MG  --   --   --  2.5*  --   PHOS 4.8* 5.2* 5.1* 8.6* 6.5*   Liver Function Tests: Recent Labs  Lab 04/15/19 0434 04/16/19 0434 04/17/19 0402 04/18/19 0456 04/19/19 0522  ALBUMIN 3.9 3.7 3.6 3.2* 3.4*   No results for input(s): LIPASE, AMYLASE in the last 168 hours. No results for input(s): AMMONIA in the last 168 hours. Coagulation Profile: Recent Labs  Lab 04/15/19 0434 04/16/19 0434 04/17/19 0402 04/18/19 0456 04/19/19 0521  INR 2.3* 2.2* 2.3* 2.8* 2.6*   CBC: Recent Labs  Lab 04/15/19 0434 04/16/19 0434 04/18/19 0456 04/19/19 0521 04/19/19 1621  WBC 10.2 10.9* 10.6* 21.3* 23.2*  HGB 10.2* 10.6* 10.1* 11.4* 11.4*  HCT 31.9* 33.2* 31.7* 36.0 35.6*  MCV 109.6* 110.3* 109.7* 110.8* 110.2*  PLT 256 277 271 318 294   Cardiac Enzymes: No results for input(s): CKTOTAL, CKMB, CKMBINDEX, TROPONINI in the last 168 hours. BNP: Invalid input(s): POCBNP  CBG: No results for input(s): GLUCAP in the last 168 hours. HbA1C: No results for input(s): HGBA1C in the last 72 hours. Urine analysis:    Component Value Date/Time   COLORURINE YELLOW 03/03/2012 1500   APPEARANCEUR CLEAR 03/03/2012 1500   LABSPEC 1.020 03/03/2012 1500   PHURINE 6.0 03/03/2012 1500   GLUCOSEU 100 (A) 03/03/2012 1500   HGBUR TRACE (A) 03/03/2012 1500   BILIRUBINUR NEGATIVE 03/03/2012 1500   KETONESUR NEGATIVE 03/03/2012 1500   PROTEINUR 100 (A) 03/03/2012 1500   UROBILINOGEN 0.2 03/03/2012 1500   NITRITE NEGATIVE 03/03/2012 1500   LEUKOCYTESUR NEGATIVE 03/03/2012 1500   Sepsis Labs: @LABRCNTIP (procalcitonin:4,lacticidven:4) ) Recent Results (from the past 240 hour(s))  MRSA PCR Screening     Status: None   Collection Time: 04/09/19  9:45 PM   Specimen: Nasopharyngeal  Result Value Ref Range Status   MRSA by PCR NEGATIVE NEGATIVE Final    Comment:        The GeneXpert MRSA Assay (FDA approved for NASAL specimens only), is one component of a comprehensive MRSA colonization surveillance program. It is not intended to diagnose MRSA infection nor to guide or monitor treatment for MRSA infections. Performed at [facility], [address], [place], [place] [address]   Culture, sputum-assessment     Status: None   Collection Time: 04/13/19  9:30 AM   Specimen: Sputum  Result Value Ref Range Status  Specimen Description SPUTUM  Final   Special Requests NONE  Final   Sputum evaluation   Final    Sputum specimen not acceptable for testing.  Please recollect.   NOTIFIED L. HILTON,RN @1014   09/06/2020KAY  SPECIMEN TO BE RECOLLECTED Performed at [facility], [address], [place], [place] [address]    Report Status 04/13/2019 FINAL  Final  MRSA PCR Screening     Status: None   Collection Time: 04/14/19  2:38 PM   Specimen: Nasal Mucosa; Nasopharyngeal  Result Value Ref Range Status   MRSA by PCR NEGATIVE NEGATIVE Final    Comment:        The GeneXpert  MRSA Assay (FDA approved for NASAL specimens only), is one component of a comprehensive MRSA colonization surveillance program. It is not intended to diagnose MRSA infection nor to guide or monitor treatment for MRSA infections. Performed at [facility], [address], [place], [place] [address]   Culture, blood (Routine X 2) w Reflex to ID Panel     Status: None (Preliminary result)   Collection Time: 04/19/19  4:21 PM   Specimen: BLOOD RIGHT HAND  Result Value Ref Range Status   Specimen Description   Final    BLOOD RIGHT HAND BOTTLES DRAWN AEROBIC AND ANAEROBIC   Special Requests   Final    Blood Culture adequate volume Performed at [facility], [address] [place], [place] [address]    Culture PENDING  Incomplete   Report Status PENDING  Incomplete  Culture, blood (Routine X 2) w Reflex to ID Panel     Status: None (Preliminary result)   Collection Time: 04/19/19  4:29 PM   Specimen: Leg; Blood  Result Value Ref Range Status   Specimen Description LEG BOTTLES DRAWN AEROBIC AND ANAEROBIC  Final   Special Requests   Final    Blood Culture adequate volume Performed at [facility], [address] [place], [place] [address]    Culture PENDING  Incomplete   Report Status PENDING  Incomplete     Scheduled Meds: . budesonide (PULMICORT) nebulizer solution  0.5 mg Nebulization BID  . Chlorhexidine Gluconate Cloth  6 each Topical Daily  . Chlorhexidine Gluconate Cloth  6 each Topical Q0600  . Chlorhexidine Gluconate Cloth  6 each Topical Q0600  . epoetin (EPOGEN/PROCRIT) injection  2,800 Units Intravenous Q M,W,F-HD  . gabapentin  100 mg Oral BID  . ipratropium  0.5 mg Nebulization TID  . levalbuterol  1.25 mg Nebulization TID  . mouth rinse  15 mL Mouth Rinse BID  . metoprolol tartrate  37.5 mg Oral BID  . midodrine  5 mg Oral Q M,W,F-HD  . pantoprazole  40 mg Oral Daily  . polyethylene glycol  17 g Oral Daily  . senna  2 tablet Oral Daily  . sevelamer carbonate   2,400 mg Oral TID WC  . sodium chloride flush  10-40 mL Intracatheter Q12H  . Warfarin - Pharmacist Dosing Inpatient   Does not apply q1800   Continuous Infusions: . sodium chloride    . sodium chloride    . amiodarone 30 mg/hr (04/19/19 0825)    Procedures/Studies: Dg Chest 2 View  Result Date: 04/10/2019 CLINICAL DATA:  Shortness of breath. EXAM: CHEST - 2 VIEW COMPARISON:  04/08/2019 and 08/18/2018 FINDINGS: Cardiac size is within normal limits. Aortic atherosclerosis. New pulmonary vascular congestion. New bilateral perihilar infiltrates. Persistent hazy infiltrate at the right lung base posteriorly. Minimal blunting of the posterior costophrenic angles. No  acute bone abnormality. IMPRESSION: New bilateral perihilar infiltrates. Persistent infiltrate at the right lung base. This could represent pulmonary edema or multifocal progressive pneumonia. Aortic Atherosclerosis (ICD10-I70.0). Electronically Signed   By: Lorriane Shire M.D.   On: 04/10/2019 11:04   Ct Head Wo Contrast  Result Date: 04/11/2019 CLINICAL DATA:  Transient ischemic attack.  Slurred speech. EXAM: CT HEAD WITHOUT CONTRAST TECHNIQUE: Contiguous axial images were obtained from the base of the skull through the vertex without intravenous contrast. COMPARISON:  CT scan of September 05, 2014. FINDINGS: Brain: No evidence of acute infarction, hemorrhage, hydrocephalus, extra-axial collection or mass lesion/mass effect. Vascular: No hyperdense vessel or unexpected calcification. Skull: Normal. Negative for fracture or focal lesion. Sinuses/Orbits: No acute finding. Other: None. IMPRESSION: No acute intracranial abnormality seen. Electronically Signed   By: Marijo Conception M.D.   On: 04/11/2019 12:08   Dg Chest Port 1 View  Result Date: 04/15/2019 CLINICAL DATA:  Shortness of breath. EXAM: PORTABLE CHEST 1 VIEW COMPARISON:  Radiographs of April 13, 2019. FINDINGS: Stable cardiomegaly. No pneumothorax or definite pleural effusion is  noted. Stable bilateral lung opacities are noted concerning for edema or inflammation. Bony thorax is unremarkable. IMPRESSION: Stable bilateral lung opacities as described above. Electronically Signed   By: Marijo Conception M.D.   On: 04/15/2019 07:31   Dg Chest Port 1 View  Result Date: 04/13/2019 CLINICAL DATA:  Shortness of breath and hypoxia in a patient with a history of end-stage renal disease. EXAM: PORTABLE CHEST 1 VIEW COMPARISON:  Single-view of the chest 04/11/2019. PA and lateral chest 04/10/2019. FINDINGS: Bilateral airspace disease has improved but there is persistent focal airspace opacity in the right mid and lower lung zones. There is cardiomegaly. No pneumothorax. Small bilateral pleural effusions. IMPRESSION: Persistent but improved bilateral airspace disease compatible with decreased pulmonary edema. More focal airspace opacity in the right midlung could be due to edema or pneumonia. Electronically Signed   By: Inge Rise M.D.   On: 04/13/2019 09:08   Dg Chest Port 1 View  Result Date: 04/11/2019 CLINICAL DATA:  Increasing shortness of breath following dialysis. EXAM: PORTABLE CHEST 1 VIEW COMPARISON:  04/10/2019. FINDINGS: Poor inspiration. Progressive enlargement of the cardiac silhouette with increased prominence of the pulmonary vasculature interstitial markings. Progressive bilateral airspace opacities, greater on the right. No definite pleural fluid seen. Unremarkable bones. IMPRESSION: 1. Progressive cardiomegaly and changes of congestive heart failure. 2. Progressive bilateral alveolar edema or pneumonia, greater on the right. Electronically Signed   By: Claudie Revering M.D.   On: 04/11/2019 19:54   Dg Chest Port 1 View  Result Date: 04/08/2019 CLINICAL DATA:  Difficulty breathing for past few days, dyspnea on exertion, denies fever, history atrial fibrillation, heart failure, end-stage renal disease on dialysis, systemic hypertension, uterine cancer EXAM: PORTABLE CHEST 1  VIEW COMPARISON:  Portable exam 1645 hours compared to 08/18/2018 FINDINGS: Enlargement of cardiac silhouette. Mediastinal contours and pulmonary vascularity normal. Atherosclerotic calcification aorta. RIGHT lower lobe consolidation consistent with pneumonia. Remaining lungs clear. No pleural effusion or pneumothorax. Bones demineralized. IMPRESSION: Enlargement of cardiac silhouette. RIGHT lower lobe consolidation consistent with pneumonia. Electronically Signed   By: Lavonia Dana M.D.   On: 04/08/2019 17:18    Orson Eva, DO  [facility] Pager [phone]  If 7PM-7AM, please contact night-coverage [email or web address] Password [email or web address] 04/19/2019, 5:12 PM   LOS: 9 days

## 2019-04-19 NOTE — Progress Notes (Signed)
Pharmacy Antibiotic Note  Sabrina Beck is a 78 y.o. female admitted on 04/08/2019 with HCAP. Pharmacy has been consulted to re-start vancomycin and add Zosyn therapy. This patient has ESRD and is dialyzed on M-W-F.  Plan: Start Zosyn 3.375g IV q12h (4-hr infusion) Give vancomycin 1.5g IV x1 dose Give vancomycin 1g IV q Mon-Wed-Fri after each HD session Target pre-HD vancomycin range: 15-25 mcg/mL Target post-HD vancomycin range: 15-25 mcg/mL Pharmacy will continue to monitor labs, cultures and vancomycin levels as appropriate.   Height: 5\' 4"  (162.6 cm) Weight: 215 lb 6.2 oz (97.7 kg) IBW/kg (Calculated) : 54.7  Temp (24hrs), Avg:98.4 F (36.9 C), Min:98.4 F (36.9 C), Max:98.4 F (36.9 C)  Recent Labs  Lab 04/15/19 0434 04/16/19 0434 04/17/19 0402 04/18/19 0456 04/19/19 0521 04/19/19 0522 04/19/19 1621  WBC 10.2 10.9*  --  10.6* 21.3*  --  23.2*  CREATININE 4.87*  4.87* 4.60* 4.11* 7.00*  --  4.89*  --     Estimated Creatinine Clearance: 10.6 mL/min (A) (by C-G formula based on SCr of 4.89 mg/dL (H)).    No Known Allergies  Antimicrobials this admission: Cefepime 9/2>>9/6 Vancomycin 9/2 >>9/3-->re-started 9/12>> Azithromycin 9/2>> 9/4 Ceftriaxone 1gm IV x 1 in ED 9/2 Augmentin 9/6 >>9/10   Microbiology results: 9/12 [facility] x2: pending 9/1 BCx: ng (final) 9/2 MRSA PCR: negative 9/6 sputum: neg (final)    Thank you for allowing pharmacy to participate in this patient's care.  [name] 04/19/2019 7:01 PM

## 2019-04-19 NOTE — Progress Notes (Signed)
ANTICOAGULATION CONSULT NOTE   Pharmacy Consult for Coumadin Indication: atrial fibrillation  No Known Allergies  Patient Measurements: Height: 5\' 4"  (162.6 cm) Weight: 215 lb 6.2 oz (97.7 kg) IBW/kg (Calculated) : 54.7  Vital Signs: Temp: 98.4 F (36.9 C) (09/12 0400) Temp Source: Oral (09/12 0400) BP: 126/73 (09/12 0800) Pulse Rate: 100 (09/12 0800)  Labs: Recent Labs    04/17/19 0402 04/18/19 0456 04/19/19 0521 04/19/19 0522  HGB  --  10.1* 11.4*  --   HCT  --  31.7* 36.0  --   PLT  --  271 318  --   LABPROT 24.8* 29.2* 27.7*  --   INR 2.3* 2.8* 2.6*  --   CREATININE 4.11* 7.00*  --  4.89*    Estimated Creatinine Clearance: 10.6 mL/min (A) (by C-G formula based on SCr of 4.89 mg/dL (H)).    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 She presented to the ED with shortness of breath. Patient had been getting progressively short of breath over 5 days. Patient chronically anticoagulated with warfarin.    Home dose:  10mg  on Monday and 7.5mg  all other days. Drug Interactions: amiodarone  INR is 2.5 today, so will   give home dose of 7.5mg .  Goal of Therapy:  INR 2-3 Monitor platelets by anticoagulation protocol: Yes  Plan:  Give Coumadin 7.5 mg po x 1 today Daily PT-INR Monitor for s/s of bleeding  [name], Pharm. D. Clinical Pharmacist 04/19/2019 9:05 AM

## 2019-04-20 ENCOUNTER — Inpatient Hospital Stay (HOSPITAL_COMMUNITY): Payer: Medicare HMO

## 2019-04-20 DIAGNOSIS — E782 Mixed hyperlipidemia: Secondary | ICD-10-CM

## 2019-04-20 DIAGNOSIS — Z7901 Long term (current) use of anticoagulants: Secondary | ICD-10-CM

## 2019-04-20 DIAGNOSIS — I4821 Permanent atrial fibrillation: Secondary | ICD-10-CM

## 2019-04-20 LAB — CBC
HCT: 33.6 % — ABNORMAL LOW (ref 36.0–46.0)
Hemoglobin: 10.8 g/dL — ABNORMAL LOW (ref 12.0–15.0)
MCH: 35.8 pg — ABNORMAL HIGH (ref 26.0–34.0)
MCHC: 32.1 g/dL (ref 30.0–36.0)
MCV: 111.3 fL — ABNORMAL HIGH (ref 80.0–100.0)
Platelets: 247 10*3/uL (ref 150–400)
RBC: 3.02 MIL/uL — ABNORMAL LOW (ref 3.87–5.11)
RDW: 13.6 % (ref 11.5–15.5)
WBC: 23.8 10*3/uL — ABNORMAL HIGH (ref 4.0–10.5)
nRBC: 0 % (ref 0.0–0.2)

## 2019-04-20 LAB — RENAL FUNCTION PANEL
Albumin: 2.9 g/dL — ABNORMAL LOW (ref 3.5–5.0)
Anion gap: 18 — ABNORMAL HIGH (ref 5–15)
BUN: 63 mg/dL — ABNORMAL HIGH (ref 8–23)
CO2: 23 mmol/L (ref 22–32)
Calcium: 9.7 mg/dL (ref 8.9–10.3)
Chloride: 92 mmol/L — ABNORMAL LOW (ref 98–111)
Creatinine, Ser: 7.49 mg/dL — ABNORMAL HIGH (ref 0.44–1.00)
GFR calc Af Amer: 5 mL/min — ABNORMAL LOW (ref >60)
GFR calc non Af Amer: 5 mL/min — ABNORMAL LOW (ref >60)
Glucose, Bld: 117 mg/dL — ABNORMAL HIGH (ref 70–99)
Phosphorus: 8.2 mg/dL — ABNORMAL HIGH (ref 2.5–4.6)
Potassium: 4.5 mmol/L (ref 3.5–5.1)
Sodium: 133 mmol/L — ABNORMAL LOW (ref 135–145)

## 2019-04-20 LAB — PROTIME-INR
INR: 3.6 — ABNORMAL HIGH (ref 0.8–1.2)
Prothrombin Time: 35.2 seconds — ABNORMAL HIGH (ref 11.4–15.2)

## 2019-04-20 MED ORDER — VANCOMYCIN HCL IN DEXTROSE 1-5 GM/200ML-% IV SOLN
1000.0000 mg | INTRAVENOUS | Status: DC
  Administered 2019-04-21: 1000 mg via INTRAVENOUS
  Filled 2019-04-20: qty 200

## 2019-04-20 MED ORDER — CHLORHEXIDINE GLUCONATE CLOTH 2 % EX PADS
6.0000 | MEDICATED_PAD | Freq: Every day | CUTANEOUS | Status: DC
  Administered 2019-04-21 – 2019-04-22 (×2): 6 via TOPICAL

## 2019-04-20 MED ORDER — HYDROCODONE-ACETAMINOPHEN 5-325 MG PO TABS
1.0000 | ORAL_TABLET | Freq: Four times a day (QID) | ORAL | Status: DC | PRN
  Administered 2019-04-20 – 2019-04-22 (×4): 2 via ORAL
  Filled 2019-04-20 (×4): qty 2

## 2019-04-20 NOTE — Progress Notes (Signed)
PROGRESS NOTE  Sabrina Beck NAT:[id number] DOB: [birth year] DOA: 04/08/2019 PCP: Redmond School, MD   Brief History: 78 y.o.femalewith medical history significant ofend-stage renal disease, permanent atrial fibrillation on chronic anticoagulation with Coumadin, chronicsystolic anddiastolic CHF, hypertension, GERD, presents to the hospital with chief complaint of significant shortness of breathx 5 days.She reports that it felt like she just could not take a deep breath in. Patient denied any chest pain, coughing, fever, dizziness, n/v/d.  ED course Chemistry shows a glucose of 140, BUN of 45, creatinine of 9.98, and GFR of 3. Patient has a BNP of 344. Initial troponin is 20. Hematology reveals no leukocytosis, but a macrocytic anemia with a hemoglobin of 11. Chest x-ray was done that shows enlargement of cardiac silhouette. Right lower lobe consolidation consistent with pneumonia. Blood cultures drawn. Lactic acid 1.6. Troponin stable at 22. Second lactic acid 1.3.  On 04/19/19, the patient's WBC increased, but she remained afebrile and hemodynamically stable.  Blood cultures x 2 sets were repeated and pt was restarted on zosyn and vanco empirically.  Assessment/Plan: Acute hypoxic respiratory failure with hypoxia due tolobar pneumoniaas well as pulmonary edema -04/08/19--Patient was admitted to stepdown and placed on supplemental oxygen.  -weaned to RA, but new oxygen requirement again on 9/12 -Nephrologyfollow up appreciated -Wheezing has improved -Continuepulmicort to 0.5 mg bid during hospitalization -pt endorsed some indiscretion with fluidsat home -1/16--Echo--EF 45%, mid inferior septal HK, PASP 21, mild TR/MR -finished9days abx during hospitalization  HCAP/Lobar Pneumonia -04/19/19 WBC went up -04/19/19--procalcitonin 3.14 -CT abd/pelvis-- -blood cultures x 2 sets -empiric vanc and zosyn pending repeat culture data -CT  chest  Colitis -suspect ischemic colitis -04/19/19--CT abd/pelvis--segmental thickening+adjacent percolonic hazy mesenteric stranding -consult GI -continue Zosyn  Poor IV access -femoral CVL placed 04/17/19 -plan to remove CVL if able to place IJ central line  chronicsystolic and diastolicCHF -Given the decrease in EF, discontinueddiltiazem in Jan 2020 -08/22/18--Echo--EF 45%, mid inferior septal HK, PASP 21, mild TR/MR -continue metoprolol tartrate 25 mg bid -wt down from 106 to97.7Kg. -now appears euvolemic  ESRD -last HD 04/15/19--UF only -HD on 9/9and9/11 -appreciate nephrology -midodrine on HD to help maintain BP  Bronchospasm -Continue Pulmicort to 0.5 mg bid -d/c prednisone -Continueatrovent/Xopenex neb -improved  Atrial fibrillation, permanent, with RVR -This is likely in the setting of fluid overload,pneumonia, hypoxia -she was placed onesmololinfusionby cardiology-->weaned off 9/11 -restarted metoprolol--increase to 37.5 mg bid -Continue Coumadin per pharmacy -rate control difficult due to low BPsfor nearly one week -Appreciate cardiology consultation -amiodarone drip started 04/17/19-->continue for now in setting of new infectious process and high risk of RVR -anticipate HR will continue to improve with improvement of her acute illness  Hypercalcemia -Hectorol on hold -per nephrology  Hypertension -Blood pressure remained stable/soft  Anemia of end-stage renal disease -Hemoglobin stable, no bleeding -continue ESA  GERD -Continue home PPI      Disposition Plan: remain in SDU Family Communication: son updated at bedside 04/19/19  Consultants:renal, cardiology  Code Status: FULL   DVT Prophylaxis:coumadin   Procedures: Femoral CVL--9/10>>>>  Antibiotics: vanc 9/2 Ceftriaxone 9/1 Cefepime 9/2>>>9/5 Amox/clav 9/6>>>9/10 vanco 9/12>>> Zosyn 9/12>>>  The patient is critically ill with multiple organ  systems failure and requires high complexity decision making for assessment and support, frequent evaluation and titration of therapies, application of advanced monitoring technologies and extensive interpretation of multiple databases.  Critical care time - 35 mins.      Subjective: Pt states abd pain is a  little better than yesterday.  Denies diarrhea, hematochezia, melena.  Denies f/c, headache, cp, sob, cough, hemoptysis.  Objective: Vitals:   04/20/19 0700 04/20/19 0731 04/20/19 0840 04/20/19 0844  BP: (!) 99/57 102/66    Pulse: 97 95    Resp: 17 (!) 22    Temp:  98.4 F (36.9 C)    TempSrc:  Oral    SpO2: 93% 95% 94% 98%  Weight:      Height:        Intake/Output Summary (Last 24 hours) at 04/20/2019 0847 Last data filed at 04/20/2019 0300 Gross per 24 hour  Intake 641.95 ml  Output --  Net 641.95 ml   Weight change: -1.9 kg Exam:   General:  Pt is alert, follows commands appropriately, not in acute distress  HEENT: No icterus, No thrush, No neck mass, [place]/AT  Cardiovascular: RRR, S1/S2, no rubs, no gallops  Respiratory: bibasilar rales. No wheeze  Abdomen: Soft/+BS, peribumibilical tender, non distended, no guarding  Extremities: No edema, No lymphangitis, No petechiae, No rashes, no synovitis   Data Reviewed: I have personally reviewed following labs and imaging studies Basic Metabolic Panel: Recent Labs  Lab 04/16/19 0434 04/17/19 0402 04/18/19 0456 04/19/19 0522 04/20/19 0510  NA 137 137 136 136 133*  K 4.0 3.7 4.4 4.6 4.5  CL 96* 98 97* 95* 92*  CO2 28 27 22 27 23   GLUCOSE 131* 114* 116* 124* 117*  BUN 38* 32* 57* 35* 63*  CREATININE 4.60* 4.11* 7.00* 4.89* 7.49*  CALCIUM 10.6* 9.8 9.6 10.0 9.7  MG  --   --  2.5*  --   --   PHOS 5.2* 5.1* 8.6* 6.5* 8.2*   Liver Function Tests: Recent Labs  Lab 04/16/19 0434 04/17/19 0402 04/18/19 0456 04/19/19 0522 04/20/19 0510  ALBUMIN 3.7 3.6 3.2* 3.4* 2.9*   No results for input(s): LIPASE,  AMYLASE in the last 168 hours. No results for input(s): AMMONIA in the last 168 hours. Coagulation Profile: Recent Labs  Lab 04/16/19 0434 04/17/19 0402 04/18/19 0456 04/19/19 0521 04/20/19 0510  INR 2.2* 2.3* 2.8* 2.6* 3.6*   CBC: Recent Labs  Lab 04/16/19 0434 04/18/19 0456 04/19/19 0521 04/19/19 1621 04/20/19 0510  WBC 10.9* 10.6* 21.3* 23.2* 23.8*  HGB 10.6* 10.1* 11.4* 11.4* 10.8*  HCT 33.2* 31.7* 36.0 35.6* 33.6*  MCV 110.3* 109.7* 110.8* 110.2* 111.3*  PLT 277 271 318 294 247   Cardiac Enzymes: No results for input(s): CKTOTAL, CKMB, CKMBINDEX, TROPONINI in the last 168 hours. BNP: Invalid input(s): POCBNP CBG: No results for input(s): GLUCAP in the last 168 hours. HbA1C: No results for input(s): HGBA1C in the last 72 hours. Urine analysis:    Component Value Date/Time   COLORURINE YELLOW 03/03/2012 1500   APPEARANCEUR CLEAR 03/03/2012 1500   LABSPEC 1.020 03/03/2012 1500   PHURINE 6.0 03/03/2012 1500   GLUCOSEU 100 (A) 03/03/2012 1500   HGBUR TRACE (A) 03/03/2012 1500   BILIRUBINUR NEGATIVE 03/03/2012 1500   KETONESUR NEGATIVE 03/03/2012 1500   PROTEINUR 100 (A) 03/03/2012 1500   UROBILINOGEN 0.2 03/03/2012 1500   NITRITE NEGATIVE 03/03/2012 1500   LEUKOCYTESUR NEGATIVE 03/03/2012 1500   Sepsis Labs: @LABRCNTIP (procalcitonin:4,lacticidven:4) ) Recent Results (from the past 240 hour(s))  Culture, sputum-assessment     Status: None   Collection Time: 04/13/19  9:30 AM   Specimen: Sputum  Result Value Ref Range Status   Specimen Description SPUTUM  Final   Special Requests NONE  Final   Sputum evaluation  Final    Sputum specimen not acceptable for testing.  Please recollect.   NOTIFIED L. HILTON,RN @1014   09/06/2020KAY  SPECIMEN TO BE RECOLLECTED Performed at [facility], [address] [place], [place] [address]    Report Status 04/13/2019 FINAL  Final  MRSA PCR Screening     Status: None   Collection Time: 04/14/19  2:38 PM   Specimen:  Nasal Mucosa; Nasopharyngeal  Result Value Ref Range Status   MRSA by PCR NEGATIVE NEGATIVE Final    Comment:        The GeneXpert MRSA Assay (FDA approved for NASAL specimens only), is one component of a comprehensive MRSA colonization surveillance program. It is not intended to diagnose MRSA infection nor to guide or monitor treatment for MRSA infections. Performed at [facility], [address], [place], [place] [address]   Culture, blood (Routine X 2) w Reflex to ID Panel     Status: None (Preliminary result)   Collection Time: 04/19/19  4:21 PM   Specimen: BLOOD RIGHT HAND  Result Value Ref Range Status   Specimen Description   Final    BLOOD RIGHT HAND BOTTLES DRAWN AEROBIC AND ANAEROBIC   Special Requests   Final    Blood Culture adequate volume Performed at [facility], [address] [place], [place] [address]    Culture PENDING  Incomplete   Report Status PENDING  Incomplete  Culture, blood (Routine X 2) w Reflex to ID Panel     Status: None (Preliminary result)   Collection Time: 04/19/19  4:29 PM   Specimen: Leg; Blood  Result Value Ref Range Status   Specimen Description LEG BOTTLES DRAWN AEROBIC AND ANAEROBIC  Final   Special Requests   Final    Blood Culture adequate volume Performed at [facility], [address], [place], [place] [address]    Culture PENDING  Incomplete   Report Status PENDING  Incomplete     Scheduled Meds:  budesonide (PULMICORT) nebulizer solution  0.5 mg Nebulization BID   Chlorhexidine Gluconate Cloth  6 each Topical Daily   Chlorhexidine Gluconate Cloth  6 each Topical Q0600   Chlorhexidine Gluconate Cloth  6 each Topical Q0600   epoetin (EPOGEN/PROCRIT) injection  2,800 Units Intravenous Q M,W,F-HD   gabapentin  100 mg Oral BID   ipratropium  0.5 mg Nebulization TID   levalbuterol  1.25 mg Nebulization TID   mouth rinse  15 mL Mouth Rinse BID   metoprolol tartrate  37.5 mg Oral BID   midodrine  5 mg Oral Q  M,W,F-HD   pantoprazole  40 mg Oral Daily   polyethylene glycol  17 g Oral Daily   senna  2 tablet Oral Daily   sevelamer carbonate  2,400 mg Oral TID WC   sodium chloride flush  10-40 mL Intracatheter Q12H   Warfarin - Pharmacist Dosing Inpatient   Does not apply q1800   Continuous Infusions:  sodium chloride     sodium chloride     amiodarone 30 mg/hr (04/20/19 0524)   piperacillin-tazobactam (ZOSYN)  IV Stopped (04/20/19 0012)   [START ON 04/21/2019] vancomycin      Procedures/Studies: Ct Abdomen Pelvis Wo Contrast  Result Date: 04/19/2019 CLINICAL DATA:  Abdominal pain, gastroenteritis or colitis suspected EXAM: CT ABDOMEN AND PELVIS WITHOUT CONTRAST TECHNIQUE: Multidetector CT imaging of the abdomen and pelvis was performed following the standard protocol without IV contrast. COMPARISON:  None. FINDINGS: Lower chest: Multifocal consolidative opacities are present in the lung bases. More  masslike consolidation is present in the left lower lobe. Diffuse airways thickening is noted in the bases. Dependent atelectatic changes noted posteriorly. Trace effusions are present as well. There is cardiomegaly with biatrial enlargement. Atherosclerotic calcification of the coronary arteries. Atherosclerotic calcifications are present upon the aortic leaflets and within the mitral valve annulus. Hepatobiliary: No focal liver abnormality is seen. Patient is post cholecystectomy. Slight prominence of the biliary tree likely related to reservoir effect. No calcified intraductal gallstones. Pancreas: Unremarkable. No pancreatic ductal dilatation or surrounding inflammatory changes. Spleen: Normal in size without focal abnormality. Adrenals/Urinary Tract: Normal adrenal glands. Severe atrophy of both kidneys with multiple vascular calcifications in the renal hila and few lobular subcentimeter renal lesions likely reflecting cysts. These are too small to fully characterize on CT imaging but  statistically likely benign. Stomach/Bowel: Distal esophagus, stomach and duodenal sweep are unremarkable. No bowel wall thickening or dilatation. No evidence of obstruction. A normal appendix is visualized. There is segmental thickening and adjacent pericolonic hazy mesenteric stranding involving the transverse colon and proximal descending colon. There are numerous pancolonic diverticula however inflammation is not appear centered upon a focal culprit diverticulum. No resulting obstruction. Vascular/Lymphatic: Severe atherosclerotic calcification. Evaluation of vascular patency is limited by the absence of intravenous contrast. No suspicious or enlarged lymph nodes in the included lymphatic chains. Reproductive: Uterus is surgically absent. No concerning adnexal lesions. Other: No abdominopelvic free fluid or free gas. No bowel containing hernias. Fat containing ventral/umbilical hernia. Anterior abdominal wall laxity. Small amount of soft tissue stranding/edema along the left lateral abdominal wall. Musculoskeletal: Multilevel degenerative changes are present in the imaged portions of the spine. IMPRESSION: 1. Segmental thickening and adjacent pericolonic hazy mesenteric stranding involving the transverse colon and proximal descending colon, consistent with colitis, which could be infectious, inflammatory or potentially vascular in etiology. Evaluation of the vasculature is limited in the absence of contrast media. 2. Multifocal areas of airspace opacity in both lower lobes. More masslike consolidation is present in the left lung base. Recommend reimaging following therapeutic intervention to ensure resolution. 3. Extensive pancolonic diverticulosis without evidence of acute diverticulitis. 4. Bilateral renal atrophy. Scattered cystic lesions in both kidneys, too small characterize on CT imaging. 5. Small fat containing ventral hernia. 6. Aortic Atherosclerosis (ICD10-I70.0). Electronically Signed   By: Lovena Le M.D.   On: 04/19/2019 22:36   Dg Chest 2 View  Result Date: 04/10/2019 CLINICAL DATA:  Shortness of breath. EXAM: CHEST - 2 VIEW COMPARISON:  04/08/2019 and 08/18/2018 FINDINGS: Cardiac size is within normal limits. Aortic atherosclerosis. New pulmonary vascular congestion. New bilateral perihilar infiltrates. Persistent hazy infiltrate at the right lung base posteriorly. Minimal blunting of the posterior costophrenic angles. No acute bone abnormality. IMPRESSION: New bilateral perihilar infiltrates. Persistent infiltrate at the right lung base. This could represent pulmonary edema or multifocal progressive pneumonia. Aortic Atherosclerosis (ICD10-I70.0). Electronically Signed   By: Lorriane Shire M.D.   On: 04/10/2019 11:04   Ct Head Wo Contrast  Result Date: 04/11/2019 CLINICAL DATA:  Transient ischemic attack.  Slurred speech. EXAM: CT HEAD WITHOUT CONTRAST TECHNIQUE: Contiguous axial images were obtained from the base of the skull through the vertex without intravenous contrast. COMPARISON:  CT scan of September 05, 2014. FINDINGS: Brain: No evidence of acute infarction, hemorrhage, hydrocephalus, extra-axial collection or mass lesion/mass effect. Vascular: No hyperdense vessel or unexpected calcification. Skull: Normal. Negative for fracture or focal lesion. Sinuses/Orbits: No acute finding. Other: None. IMPRESSION: No acute intracranial abnormality seen. Electronically Signed   By:  Marijo Conception M.D.   On: 04/11/2019 12:08   Dg Chest Port 1 View  Result Date: 04/15/2019 CLINICAL DATA:  Shortness of breath. EXAM: PORTABLE CHEST 1 VIEW COMPARISON:  Radiographs of April 13, 2019. FINDINGS: Stable cardiomegaly. No pneumothorax or definite pleural effusion is noted. Stable bilateral lung opacities are noted concerning for edema or inflammation. Bony thorax is unremarkable. IMPRESSION: Stable bilateral lung opacities as described above. Electronically Signed   By: Marijo Conception M.D.   On:  04/15/2019 07:31   Dg Chest Port 1 View  Result Date: 04/13/2019 CLINICAL DATA:  Shortness of breath and hypoxia in a patient with a history of end-stage renal disease. EXAM: PORTABLE CHEST 1 VIEW COMPARISON:  Single-view of the chest 04/11/2019. PA and lateral chest 04/10/2019. FINDINGS: Bilateral airspace disease has improved but there is persistent focal airspace opacity in the right mid and lower lung zones. There is cardiomegaly. No pneumothorax. Small bilateral pleural effusions. IMPRESSION: Persistent but improved bilateral airspace disease compatible with decreased pulmonary edema. More focal airspace opacity in the right midlung could be due to edema or pneumonia. Electronically Signed   By: Inge Rise M.D.   On: 04/13/2019 09:08   Dg Chest Port 1 View  Result Date: 04/11/2019 CLINICAL DATA:  Increasing shortness of breath following dialysis. EXAM: PORTABLE CHEST 1 VIEW COMPARISON:  04/10/2019. FINDINGS: Poor inspiration. Progressive enlargement of the cardiac silhouette with increased prominence of the pulmonary vasculature interstitial markings. Progressive bilateral airspace opacities, greater on the right. No definite pleural fluid seen. Unremarkable bones. IMPRESSION: 1. Progressive cardiomegaly and changes of congestive heart failure. 2. Progressive bilateral alveolar edema or pneumonia, greater on the right. Electronically Signed   By: Claudie Revering M.D.   On: 04/11/2019 19:54   Dg Chest Port 1 View  Result Date: 04/08/2019 CLINICAL DATA:  Difficulty breathing for past few days, dyspnea on exertion, denies fever, history atrial fibrillation, heart failure, end-stage renal disease on dialysis, systemic hypertension, uterine cancer EXAM: PORTABLE CHEST 1 VIEW COMPARISON:  Portable exam 1645 hours compared to 08/18/2018 FINDINGS: Enlargement of cardiac silhouette. Mediastinal contours and pulmonary vascularity normal. Atherosclerotic calcification aorta. RIGHT lower lobe consolidation  consistent with pneumonia. Remaining lungs clear. No pleural effusion or pneumothorax. Bones demineralized. IMPRESSION: Enlargement of cardiac silhouette. RIGHT lower lobe consolidation consistent with pneumonia. Electronically Signed   By: Lavonia Dana M.D.   On: 04/08/2019 17:18    Orson Eva, DO  [facility] Pager [phone]  If 7PM-7AM, please contact night-coverage [email or web address] Password [facility] 04/20/2019, 8:47 AM   LOS: 10 days

## 2019-04-20 NOTE — Progress Notes (Signed)
Removed right femoral line per order Dr. Arnoldo Morale. Measured 20cm out. Pressure applied to site for 3mins. No bruising, bleeding noted at the time of removal. Pt now has right IJ line placed by [facility]. Tolerated well.

## 2019-04-20 NOTE — Progress Notes (Signed)
ANTICOAGULATION CONSULT NOTE   Pharmacy Consult for Coumadin Indication: atrial fibrillation  No Known Allergies  Patient Measurements: Height: 5\' 4"  (162.6 cm) Weight: 216 lb 0.8 oz (98 kg) IBW/kg (Calculated) : 54.7  Vital Signs: Temp: 98 F (36.7 C) (09/13 1057) Temp Source: Oral (09/13 1057) BP: 98/59 (09/13 1000) Pulse Rate: 90 (09/13 1057)  Labs: Recent Labs    04/18/19 0456 04/19/19 0521 04/19/19 0522 04/19/19 1621 04/20/19 0510  HGB 10.1* 11.4*  --  11.4* 10.8*  HCT 31.7* 36.0  --  35.6* 33.6*  PLT 271 318  --  294 247  LABPROT 29.2* 27.7*  --   --  35.2*  INR 2.8* 2.6*  --   --  3.6*  CREATININE 7.00*  --  4.89*  --  7.49*    Estimated Creatinine Clearance: 6.9 mL/min (A) (by C-G formula based on SCr of 7.49 mg/dL (H)).    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 She presented to the ED with shortness of breath.  Patient chronically anticoagulated with warfarin.    Home dose:  10mg  on Monday and 7.5mg  all other days. Drug Interactions: amiodarone  9/13 AM UPDATE:  INR is 3.6, which is one whole point higher than INR of 2.6 yesterday. Will hold warfarin until INR is <3 and re-start at much lower dose due to amiodarone interaction/CHF exacerbation.  Goal of Therapy:  INR 2-3 Monitor platelets by anticoagulation protocol: Yes  Plan:  HOLD warfarin today for INR 3.6   Daily PT-INR Monitor for s/s of bleeding  [name], Pharm. D. Clinical Pharmacist 04/20/2019 11:42 AM

## 2019-04-21 ENCOUNTER — Encounter (HOSPITAL_COMMUNITY): Payer: Self-pay | Admitting: Gastroenterology

## 2019-04-21 DIAGNOSIS — D689 Coagulation defect, unspecified: Secondary | ICD-10-CM

## 2019-04-21 DIAGNOSIS — K559 Vascular disorder of intestine, unspecified: Secondary | ICD-10-CM

## 2019-04-21 DIAGNOSIS — Z7189 Other specified counseling: Secondary | ICD-10-CM

## 2019-04-21 DIAGNOSIS — A419 Sepsis, unspecified organism: Secondary | ICD-10-CM

## 2019-04-21 DIAGNOSIS — Z515 Encounter for palliative care: Secondary | ICD-10-CM

## 2019-04-21 DIAGNOSIS — R6521 Severe sepsis with septic shock: Secondary | ICD-10-CM

## 2019-04-21 LAB — PROTIME-INR
INR: 4.3 (ref 0.8–1.2)
Prothrombin Time: 40.6 seconds — ABNORMAL HIGH (ref 11.4–15.2)

## 2019-04-21 LAB — RENAL FUNCTION PANEL
Albumin: 2.5 g/dL — ABNORMAL LOW (ref 3.5–5.0)
Anion gap: 17 — ABNORMAL HIGH (ref 5–15)
BUN: 85 mg/dL — ABNORMAL HIGH (ref 8–23)
CO2: 24 mmol/L (ref 22–32)
Calcium: 9.4 mg/dL (ref 8.9–10.3)
Chloride: 92 mmol/L — ABNORMAL LOW (ref 98–111)
Creatinine, Ser: 9.19 mg/dL — ABNORMAL HIGH (ref 0.44–1.00)
GFR calc Af Amer: 4 mL/min — ABNORMAL LOW (ref >60)
GFR calc non Af Amer: 4 mL/min — ABNORMAL LOW (ref >60)
Glucose, Bld: 100 mg/dL — ABNORMAL HIGH (ref 70–99)
Phosphorus: 9.6 mg/dL — ABNORMAL HIGH (ref 2.5–4.6)
Potassium: 4.5 mmol/L (ref 3.5–5.1)
Sodium: 133 mmol/L — ABNORMAL LOW (ref 135–145)

## 2019-04-21 LAB — CBC
HCT: 31.4 % — ABNORMAL LOW (ref 36.0–46.0)
Hemoglobin: 10 g/dL — ABNORMAL LOW (ref 12.0–15.0)
MCH: 34.7 pg — ABNORMAL HIGH (ref 26.0–34.0)
MCHC: 31.8 g/dL (ref 30.0–36.0)
MCV: 109 fL — ABNORMAL HIGH (ref 80.0–100.0)
Platelets: 226 10*3/uL (ref 150–400)
RBC: 2.88 MIL/uL — ABNORMAL LOW (ref 3.87–5.11)
RDW: 13.3 % (ref 11.5–15.5)
WBC: 19.9 10*3/uL — ABNORMAL HIGH (ref 4.0–10.5)
nRBC: 0 % (ref 0.0–0.2)

## 2019-04-21 LAB — PROCALCITONIN: Procalcitonin: 4.02 ng/mL

## 2019-04-21 MED ORDER — PHYTONADIONE 5 MG PO TABS
2.5000 mg | ORAL_TABLET | Freq: Once | ORAL | Status: AC
  Administered 2019-04-21: 12:00:00 2.5 mg via ORAL
  Filled 2019-04-21: qty 1

## 2019-04-21 MED ORDER — SORBITOL 70 % SOLN: 30.0000 mL | Freq: Once | Status: DC

## 2019-04-21 MED ORDER — METOPROLOL TARTRATE 25 MG PO TABS
25.0000 mg | ORAL_TABLET | Freq: Two times a day (BID) | ORAL | Status: DC
  Administered 2019-04-21 – 2019-04-24 (×7): 25 mg via ORAL
  Filled 2019-04-21 (×8): qty 1

## 2019-04-21 MED ORDER — ALBUMIN HUMAN 25 % IV SOLN
25.0000 g | Freq: Once | INTRAVENOUS | Status: AC
  Administered 2019-04-21: 12:00:00 25 g via INTRAVENOUS

## 2019-04-21 MED ORDER — SORBITOL 70 % SOLN: 30.0000 mL | Freq: Every day | Status: DC | PRN

## 2019-04-21 MED ORDER — ALBUMIN HUMAN 25 % IV SOLN
INTRAVENOUS | Status: AC
  Administered 2019-04-21: 25 g via INTRAVENOUS
  Filled 2019-04-21: qty 100

## 2019-04-21 MED ORDER — ALBUMIN HUMAN 25 % IV SOLN
25.0000 g | Freq: Once | INTRAVENOUS | Status: AC
  Administered 2019-04-21: 25 g via INTRAVENOUS

## 2019-04-21 MED ORDER — MIDODRINE HCL 5 MG PO TABS
10.0000 mg | ORAL_TABLET | ORAL | Status: DC
  Administered 2019-04-23 – 2019-04-25 (×2): 10 mg via ORAL
  Filled 2019-04-21 (×2): qty 2

## 2019-04-21 NOTE — Consult Note (Signed)
Consultation Note Date: 04/21/2019   Patient Name: Sabrina Beck  DOB: 08-27-1940  MRN: [id number]  Age / Sex: 78 y.o., female  PCP: Redmond School, MD Referring Physician: Orson Eva, MD  Reason for Consultation: Establishing goals of care  HPI/Patient Profile: 78 y.o. 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 Had slow improvement with pneumonia but hospitalization complicated by subsequent issues with atrial fibrillation and hypotension complicated efficacy of dialysis. 9/12 she developed worsening leukocytosis and concern for ischemic colitis and possible continued pneumonia continued issues with hypotension.   Clinical Assessment and Goals of Care: I met today with Sabrina Beck. She is currently receiving dialysis. She is somewhat sleepy but otherwise appropriate and able to converse and participate in [place]. We discussed her chronic illness (reports she has been on HD x 15 years) along with acute pneumonia (previously thought to be resolved) and now with concern for ischemic colitis and pending GI consult. I explained our concern for ischemic colitis given her hypotension and ongoing acute illness. I explained that we continue to explore and treat and support her. I reassured her that this does not mean that we are in any way giving up on her and will continue our best efforts to treat her. She expresses understanding. I asked her if I had her permission to explain this to her sons and she agrees that they should know. I left her to get some rest.   I returned to bedside when son, [name], was present. [name] has been in communication with Dr. Carles Collet over the weekend. I explained to [name] her decline over the weekend and concern for ischemic colitis. I explained that she  continues to be very ill but that she has a good team of doctors working with her to support her. I explained my role of palliative care in supporting patients and families with difficult illness. I explained my goal is to support them and ensuring we all stay on the same page. I also explained that my job is also to discuss some of the more difficult decisions that arise with worsening health concerns. I explained my conversation with his mother and that she expressed her desire for no resuscitation (explained this would be worst case scenario) but her desire to continue aggressive treatment to do our best to prevent [place] from getting to that point. [name] understands but is very overwhelmed by this information and conversation. I allowed him time to process and ask questions. In the end he agrees that he would not want his mother to suffer at the end of her life. He understands that she is critically ill but is still hopeful. Sabrina Beck confirmed that these are her wishes and says "I'm not ready to just give up and just roll over and die but I don't want to lay here and just be a vegetable either." I confirmed that we  understand and that we are continuing aggressive treatment but these limits are just in the worst case scenario that she worsens towards end of life (either now or in the future).   I left [name] to process and think about our conversation. I provided him with my contact info and encouraged him to share this with his brother. Emotional support provided.   Primary Decision Maker PATIENT    SUMMARY OF RECOMMENDATIONS   - Partial code limit set (would would entertain short term intubation for reversible condition - would need further discussion) - Continue aggressive care otherwise  Code Status/Advance Care Planning:  Limited code   Symptom Management:   Per primary, nephrology, GI.   Constipation: Miralax and senokot already in place. Sorbitol ordered today x 1.   Palliative  Prophylaxis:   Aspiration and Frequent Pain Assessment, Bowel Regimen  Psycho-social/Spiritual:   Desire for further Chaplaincy support:yes  Additional Recommendations: Caregiving  Support/Resources and Grief/Bereavement Support  Prognosis:   Overall poor prognosis with ESRD and newly diagnosed ischemic colitis.   Discharge Planning: To Be Determined      Primary Diagnoses: Present on Admission: . CAP (community acquired pneumonia) . Acute respiratory failure with hypoxia ([place]) . Lobar pneumonia ([place]) . Permanent atrial fibrillation . Mixed hyperlipidemia   I have reviewed the medical record, interviewed the patient and family, and examined the patient. The following aspects are pertinent.  Past Medical History:  Diagnosis Date  . Anemia   . Atrial fibrillation ([place])   . Chest pain 05/15/2006   Stress test negative for ischemia  . Diastolic heart failure ([place])   . Diverticulosis   . ESRD on hemodialysis ([place])   . GERD (gastroesophageal reflux disease)   . Headache   . Mixed hyperlipidemia   . Peripheral neuropathy   . PONV (postoperative nausea and vomiting)   . Systemic hypertension   . Uterine cancer [facility])    Social History   Socioeconomic History  . Marital status: Widowed    Spouse name: Not on file  . Number of children: Not on file  . Years of education: Not on file  . Highest education level: Not on file  Occupational History  . Not on file  Social Needs  . Financial resource strain: Not on file  . Food insecurity    Worry: Not on file    Inability: Not on file  . Transportation needs    Medical: Not on file    Non-medical: Not on file  Tobacco Use  . Smoking status: Former Smoker    Quit date: 08/08/1999    Years since quitting: 19.7  . Smokeless tobacco: Never Used  Substance and Sexual Activity  . Alcohol use: No    Alcohol/week: 0.0 standard drinks  . Drug use: No  . Sexual activity: Not Currently  Lifestyle  . Physical activity     Days per week: Not on file    Minutes per session: Not on file  . Stress: Not on file  Relationships  . Social Herbalist on phone: Not on file    Gets together: Not on file    Attends religious service: Not on file    Active member of club or organization: Not on file    Attends meetings of clubs or organizations: Not on file    Relationship status: Not on file  Other Topics Concern  . Not on file  Social History Narrative   Lives in a one story home.  One of her sons lives with her.  Has 2 sons.  Retired Oncologist.  Eduction: high school.   Family History  Problem Relation Age of Onset  . Diabetes Mother   . Hypertension Mother   . Diabetes Father   . Heart disease Father        before age 63  . Heart attack Father   . Diabetes Sister   . Hyperlipidemia Sister   . Hypertension Sister   . Cancer Brother   . Diabetes Brother   . Heart disease Brother   . Hypertension Brother   . Heart attack Brother    Scheduled Meds: . budesonide (PULMICORT) nebulizer solution  0.5 mg Nebulization BID  . Chlorhexidine Gluconate Cloth  6 each Topical Daily  . Chlorhexidine Gluconate Cloth  6 each Topical Q0600  . Chlorhexidine Gluconate Cloth  6 each Topical Q0600  . Chlorhexidine Gluconate Cloth  6 each Topical Q0600  . epoetin (EPOGEN/PROCRIT) injection  2,800 Units Intravenous Q M,W,F-HD  . gabapentin  100 mg Oral BID  . ipratropium  0.5 mg Nebulization TID  . levalbuterol  1.25 mg Nebulization TID  . mouth rinse  15 mL Mouth Rinse BID  . metoprolol tartrate  25 mg Oral BID  . [START ON 04/23/2019] midodrine  10 mg Oral Q M,W,F-HD  . pantoprazole  40 mg Oral Daily  . phytonadione  2.5 mg Oral Once  . polyethylene glycol  17 g Oral Daily  . senna  2 tablet Oral Daily  . sevelamer carbonate  2,400 mg Oral TID WC  . sodium chloride flush  10-40 mL Intracatheter Q12H  . Warfarin - Pharmacist Dosing Inpatient   Does not apply q1800   Continuous Infusions: . sodium  chloride    . sodium chloride    . amiodarone 30 mg/hr (04/21/19 0827)  . piperacillin-tazobactam (ZOSYN)  IV 3.375 g (04/21/19 0836)  . vancomycin     PRN Meds:.sodium chloride, sodium chloride, acetaminophen, albuterol, HYDROcodone-acetaminophen, iohexol, lidocaine (PF), lidocaine-prilocaine, ondansetron (ZOFRAN) IV, pentafluoroprop-tetrafluoroeth, sodium chloride flush No Known Allergies Review of Systems  Constitutional: Positive for activity change, appetite change and fatigue.  Respiratory: Negative for cough and shortness of breath.   Gastrointestinal: Positive for constipation.  Neurological: Positive for weakness.    Physical Exam Vitals signs and nursing note reviewed.  Constitutional:      Appearance: She is ill-appearing.  Cardiovascular:     Rate and Rhythm: Normal rate.  Pulmonary:     Effort: Pulmonary effort is normal. No tachypnea, accessory muscle usage or respiratory distress.  Abdominal:     Palpations: Abdomen is soft.     Tenderness: There is no abdominal tenderness.  Neurological:     Mental Status: She is alert and oriented to person, place, and time.     Comments: Sleepy     Vital Signs: BP (!) 89/51   Pulse 97   Temp 98 F (36.7 C) (Oral)   Resp (!) 22   Ht '5\' 4"'$  (1.626 m)   Wt 100.5 kg   SpO2 95%   BMI 38.03 kg/m  Pain Scale: 0-10 POSS *See Group Information*: 1-Acceptable,Awake and alert Pain Score: 0-No pain   SpO2: SpO2: 95 % O2 Device:SpO2: 95 % O2 Flow Rate: .O2 Flow Rate (L/min): 2 L/min  IO: Intake/output summary: No intake or output data in the 24 hours ending 04/21/19 1028  LBM: Last BM Date: 04/17/19 Baseline Weight: Weight: 109.8 kg Most recent weight: Weight: 100.5 kg  Palliative Assessment/Data:     Time In/Out: 1020-1050, 0626-9485 Time Total: 50 min Greater than 50%  of this time was spent counseling and coordinating care related to the above assessment and plan.  Signed by: Vinie Sill, NP Palliative  Medicine Team Pager # [phone] (M-F 8a-5p) Team Phone # [phone] (Nights/Weekends)

## 2019-04-21 NOTE — Care Management Important Message (Signed)
Important Message  Patient Details  Name: Sabrina Beck MRN: [id number] Date of Birth: [birth year]   Medicare Important Message Given:  Yes     Tommy Medal 04/21/2019, 3:49 PM

## 2019-04-21 NOTE — Progress Notes (Signed)
PROGRESS NOTE  Sabrina Beck GEX:[id number] DOB: [birth year] DOA: 04/08/2019 PCP: Redmond School, MD  Brief History: 78 y.o.femalewith medical history significant ofend-stage renal disease, permanent atrial fibrillation on chronic anticoagulation with Coumadin, chronicsystolic anddiastolic CHF, hypertension, GERD, presents to the hospital with chief complaint of significant shortness of breathx 5 days.She reports that it felt like she just could not take a deep breath in. Patient deniedany chest pain, coughing, fever, dizziness, n/v/d.  The patient was started on IV vanco and cefepime and treated for PNA. Her abx were de-escalated.She overall improved, but she developed Afib with RVR with soft BPs.  Cardiology was consulted and pt was started on IV amio and esmolol.  Her esmolol was weaned off as she improved.  Unfortunately, her WBC increased up to 23K on 04/19/19.    ED course Chemistry shows a glucose of 140, BUN of 45, creatinine of 9.98, and GFR of 3. Patient has a BNP of 344. Initial troponin is 20. Hematology reveals no leukocytosis, but a macrocytic anemia with a hemoglobin of 11. Chest x-ray was done that shows enlargement of cardiac silhouette. Right lower lobe consolidation consistent with pneumonia. Blood cultures drawn. Lactic acid 1.6. Troponin stable at 22. Second lactic acid 1.3.  On 04/19/19, the patient's WBC increased, but she remained afebrile and hemodynamically stable. Blood cultures x 2 sets were repeated and pt was restarted on zosyn and vanco empirically. Her BPs became soft again, and she remained on amiodarone.  GI was consulted for colitis.  Assessment/Plan: Acute hypoxic respiratory failure with hypoxia due tolobar pneumoniaas well as pulmonary edema -04/08/19--Patient was admitted to stepdown and placed on supplemental oxygen.  -weaned to RA, but new oxygen requirement again on 9/12 -Nephrologyfollow up appreciated -Wheezing has  improved -Continuepulmicort to 0.5 mg bid during hospitalization -pt endorsed some indiscretion with fluidsat home -1/16--Echo--EF 45%, mid inferior septal HK, PASP 21, mild TR/MR -finished9days abx during hospitalization -on 04/19/19--WBC went up with procalcitonin  Sepsis -due to HCAP and colitis -started zosyn and vanco -04/19/19--procalcitonin 3.14>>>4.02 -BPs soft again-->increase midodrine  HCAP/Lobar Pneumonia -04/19/19 WBC went up -04/19/19--procalcitonin 3.14>>>4.02 -blood cultures x 2 sets -empiric vanc and zosyn pending repeat culture data -04/20/19 CT chest--collapse, consolidation in bilateral LL, small bilateral pleural effusion, ULN mediastinal LNs  Colitis -suspect ischemic colitis -04/19/19--CT abd/pelvis--segmental thickening+adjacent percolonic hazy mesenteric stranding -consult GI -continue Zosyn  Coagulopathy -INR trending up even without warfarin -due to sepsis -give small dose vitamin K--2.5 mg  Poor IV access -femoral CVL placed 04/17/19-->removed 04/20/19 -R-IJ PICC line placed 0/10/27  chronicsystolic and diastolicCHF -Given the decrease in EF, discontinueddiltiazem in Jan 2020 -08/22/18--Echo--EF 45%, mid inferior septal HK, PASP 21, mild TR/MR -continue metoprolol tartrate 25 mg bid -wt down from 106 to97.7Kg. -now appears euvolemic  ESRD -last HD 04/15/19--UF only -HD on 9/9and9/11 -appreciate nephrology -midodrine on HD to help maintain BP  Bronchospasm -Continue Pulmicort to 0.5 mg bid -d/c prednisone -Continueatrovent/Xopenex neb -improved  Atrial fibrillation, permanent, with RVR -This is likely in the setting of fluid overload,pneumonia, hypoxia -she was placed onesmololinfusionby cardiology-->weaned off 9/11 -restartedmetoprolol--increase to 37.5 mg bid -Continue Coumadin per pharmacy -rate control difficult due to low BPsfor nearly one week -Appreciate cardiology consultation -amiodarone drip started  04/17/19-->continue for now in setting of new infectious process and high risk of RVR -anticipate HR will continue to improve with improvement of her acute illness  Hypercalcemia -Hectorol on hold -per nephrology  Hypertension -Blood pressure soft again -decrease  metoprolol  Anemia of end-stage renal disease -Hemoglobin stable, no bleeding -continue ESA  GERD -Continue home PPI  Goals of Care -consult palliative medicine for goals of care discussion      Disposition Plan: remain in SDU Family Communication: son updated at bedside 04/20/19  Consultants:renal, cardiology  Code Status: FULL   DVT Prophylaxis:coumadin   Procedures: Femoral CVL--9/10>>>>9/13 R-IJ PICC 9/13>>>  Antibiotics: vanc 9/2 Ceftriaxone 9/1 Cefepime 9/2>>>9/5 Amox/clav 9/6>>>9/10 vanco 9/12>>> Zosyn 9/12>>>  The patient is critically ill with multiple organ systems failure and requires high complexity decision making for assessment and support, frequent evaluation and titration of therapies, application of advanced monitoring technologies and extensive interpretation of multiple databases.  Critical care time - 35 mins.       Subjective: Pt feels that abd pain is slowly improving.  She also feels sob is slowly improving.  Denies f/c, cp, n/v/d, headache, neck pain.  Objective: Vitals:   04/21/19 0200 04/21/19 0757 04/21/19 0839 04/21/19 0843  BP: (!) 82/49     Pulse: 94     Resp: (!) 25     Temp:  97.7 F (36.5 C)    TempSrc:  Oral    SpO2: 97%  95% 98%  Weight:      Height:       No intake or output data in the 24 hours ending 04/21/19 0849 Weight change:  Exam:   General:  Pt is alert, follows commands appropriately, not in acute distress  HEENT: No icterus, No thrush, No neck mass, [place]/AT  Cardiovascular: IRRR, S1/S2, no rubs, no gallops  Respiratory: bibasilar rales. No wheeze  Abdomen: Soft/+BS, mild upper abd tender, non distended, no  guarding  Extremities: trace edema, No lymphangitis, No petechiae, No rashes, no synovitis   Data Reviewed: I have personally reviewed following labs and imaging studies Basic Metabolic Panel: Recent Labs  Lab 04/17/19 0402 04/18/19 0456 04/19/19 0522 04/20/19 0510 04/21/19 0453  NA 137 136 136 133* 133*  K 3.7 4.4 4.6 4.5 4.5  CL 98 97* 95* 92* 92*  CO2 27 22 27 23 24   GLUCOSE 114* 116* 124* 117* 100*  BUN 32* 57* 35* 63* 85*  CREATININE 4.11* 7.00* 4.89* 7.49* 9.19*  CALCIUM 9.8 9.6 10.0 9.7 9.4  MG  --  2.5*  --   --   --   PHOS 5.1* 8.6* 6.5* 8.2* 9.6*   Liver Function Tests: Recent Labs  Lab 04/17/19 0402 04/18/19 0456 04/19/19 0522 04/20/19 0510 04/21/19 0453  ALBUMIN 3.6 3.2* 3.4* 2.9* 2.5*   No results for input(s): LIPASE, AMYLASE in the last 168 hours. No results for input(s): AMMONIA in the last 168 hours. Coagulation Profile: Recent Labs  Lab 04/17/19 0402 04/18/19 0456 04/19/19 0521 04/20/19 0510 04/21/19 0453  INR 2.3* 2.8* 2.6* 3.6* 4.3*   CBC: Recent Labs  Lab 04/18/19 0456 04/19/19 0521 04/19/19 1621 04/20/19 0510 04/21/19 0453  WBC 10.6* 21.3* 23.2* 23.8* 19.9*  HGB 10.1* 11.4* 11.4* 10.8* 10.0*  HCT 31.7* 36.0 35.6* 33.6* 31.4*  MCV 109.7* 110.8* 110.2* 111.3* 109.0*  PLT 271 318 294 247 226   Cardiac Enzymes: No results for input(s): CKTOTAL, CKMB, CKMBINDEX, TROPONINI in the last 168 hours. BNP: Invalid input(s): POCBNP CBG: No results for input(s): GLUCAP in the last 168 hours. HbA1C: No results for input(s): HGBA1C in the last 72 hours. Urine analysis:    Component Value Date/Time   COLORURINE YELLOW 03/03/2012 1500   APPEARANCEUR CLEAR 03/03/2012 1500   LABSPEC  1.020 03/03/2012 1500   PHURINE 6.0 03/03/2012 1500   GLUCOSEU 100 (A) 03/03/2012 1500   HGBUR TRACE (A) 03/03/2012 1500   BILIRUBINUR NEGATIVE 03/03/2012 1500   KETONESUR NEGATIVE 03/03/2012 1500   PROTEINUR 100 (A) 03/03/2012 1500   UROBILINOGEN 0.2  03/03/2012 1500   NITRITE NEGATIVE 03/03/2012 1500   LEUKOCYTESUR NEGATIVE 03/03/2012 1500   Sepsis Labs: @LABRCNTIP (procalcitonin:4,lacticidven:4) ) Recent Results (from the past 240 hour(s))  Culture, sputum-assessment     Status: None   Collection Time: 04/13/19  9:30 AM   Specimen: Sputum  Result Value Ref Range Status   Specimen Description SPUTUM  Final   Special Requests NONE  Final   Sputum evaluation   Final    Sputum specimen not acceptable for testing.  Please recollect.   NOTIFIED L. HILTON,RN @1014   09/06/2020KAY  SPECIMEN TO BE RECOLLECTED Performed at [facility], [address], [place], [place] [address]    Report Status 04/13/2019 FINAL  Final  MRSA PCR Screening     Status: None   Collection Time: 04/14/19  2:38 PM   Specimen: Nasal Mucosa; Nasopharyngeal  Result Value Ref Range Status   MRSA by PCR NEGATIVE NEGATIVE Final    Comment:        The GeneXpert MRSA Assay (FDA approved for NASAL specimens only), is one component of a comprehensive MRSA colonization surveillance program. It is not intended to diagnose MRSA infection nor to guide or monitor treatment for MRSA infections. Performed at [facility], [address], [place], [place] [address]   Culture, blood (Routine X 2) w Reflex to ID Panel     Status: None (Preliminary result)   Collection Time: 04/19/19  4:21 PM   Specimen: BLOOD RIGHT HAND  Result Value Ref Range Status   Specimen Description   Final    BLOOD RIGHT HAND BOTTLES DRAWN AEROBIC AND ANAEROBIC   Special Requests Blood Culture adequate volume  Final   Culture   Final    NO GROWTH 2 DAYS Performed at [facility], [address], [place], [place] [address]    Report Status PENDING  Incomplete  Culture, blood (Routine X 2) w Reflex to ID Panel     Status: None (Preliminary result)   Collection Time: 04/19/19  4:29 PM   Specimen: Leg; Blood  Result Value Ref Range Status   Specimen Description LEG BOTTLES DRAWN AEROBIC AND  ANAEROBIC  Final   Special Requests Blood Culture adequate volume  Final   Culture   Final    NO GROWTH 2 DAYS Performed at [facility], [address] [place], [place] [address]    Report Status PENDING  Incomplete     Scheduled Meds:  budesonide (PULMICORT) nebulizer solution  0.5 mg Nebulization BID   Chlorhexidine Gluconate Cloth  6 each Topical Daily   Chlorhexidine Gluconate Cloth  6 each Topical Q0600   Chlorhexidine Gluconate Cloth  6 each Topical Q0600   Chlorhexidine Gluconate Cloth  6 each Topical Q0600   epoetin (EPOGEN/PROCRIT) injection  2,800 Units Intravenous Q M,W,F-HD   gabapentin  100 mg Oral BID   ipratropium  0.5 mg Nebulization TID   levalbuterol  1.25 mg Nebulization TID   mouth rinse  15 mL Mouth Rinse BID   metoprolol tartrate  25 mg Oral BID   midodrine  5 mg Oral Q M,W,F-HD   pantoprazole  40 mg Oral Daily   polyethylene glycol  17 g Oral Daily   senna  2 tablet Oral  Daily   sevelamer carbonate  2,400 mg Oral TID WC   sodium chloride flush  10-40 mL Intracatheter Q12H   Warfarin - Pharmacist Dosing Inpatient   Does not apply q1800   Continuous Infusions:  sodium chloride     sodium chloride     amiodarone 30 mg/hr (04/21/19 0827)   piperacillin-tazobactam (ZOSYN)  IV 3.375 g (04/21/19 0836)   vancomycin      Procedures/Studies: Ct Abdomen Pelvis Wo Contrast  Result Date: 04/19/2019 CLINICAL DATA:  Abdominal pain, gastroenteritis or colitis suspected EXAM: CT ABDOMEN AND PELVIS WITHOUT CONTRAST TECHNIQUE: Multidetector CT imaging of the abdomen and pelvis was performed following the standard protocol without IV contrast. COMPARISON:  None. FINDINGS: Lower chest: Multifocal consolidative opacities are present in the lung bases. More masslike consolidation is present in the left lower lobe. Diffuse airways thickening is noted in the bases. Dependent atelectatic changes noted posteriorly. Trace effusions are present as well. There  is cardiomegaly with biatrial enlargement. Atherosclerotic calcification of the coronary arteries. Atherosclerotic calcifications are present upon the aortic leaflets and within the mitral valve annulus. Hepatobiliary: No focal liver abnormality is seen. Patient is post cholecystectomy. Slight prominence of the biliary tree likely related to reservoir effect. No calcified intraductal gallstones. Pancreas: Unremarkable. No pancreatic ductal dilatation or surrounding inflammatory changes. Spleen: Normal in size without focal abnormality. Adrenals/Urinary Tract: Normal adrenal glands. Severe atrophy of both kidneys with multiple vascular calcifications in the renal hila and few lobular subcentimeter renal lesions likely reflecting cysts. These are too small to fully characterize on CT imaging but statistically likely benign. Stomach/Bowel: Distal esophagus, stomach and duodenal sweep are unremarkable. No bowel wall thickening or dilatation. No evidence of obstruction. A normal appendix is visualized. There is segmental thickening and adjacent pericolonic hazy mesenteric stranding involving the transverse colon and proximal descending colon. There are numerous pancolonic diverticula however inflammation is not appear centered upon a focal culprit diverticulum. No resulting obstruction. Vascular/Lymphatic: Severe atherosclerotic calcification. Evaluation of vascular patency is limited by the absence of intravenous contrast. No suspicious or enlarged lymph nodes in the included lymphatic chains. Reproductive: Uterus is surgically absent. No concerning adnexal lesions. Other: No abdominopelvic free fluid or free gas. No bowel containing hernias. Fat containing ventral/umbilical hernia. Anterior abdominal wall laxity. Small amount of soft tissue stranding/edema along the left lateral abdominal wall. Musculoskeletal: Multilevel degenerative changes are present in the imaged portions of the spine. IMPRESSION: 1. Segmental  thickening and adjacent pericolonic hazy mesenteric stranding involving the transverse colon and proximal descending colon, consistent with colitis, which could be infectious, inflammatory or potentially vascular in etiology. Evaluation of the vasculature is limited in the absence of contrast media. 2. Multifocal areas of airspace opacity in both lower lobes. More masslike consolidation is present in the left lung base. Recommend reimaging following therapeutic intervention to ensure resolution. 3. Extensive pancolonic diverticulosis without evidence of acute diverticulitis. 4. Bilateral renal atrophy. Scattered cystic lesions in both kidneys, too small characterize on CT imaging. 5. Small fat containing ventral hernia. 6. Aortic Atherosclerosis (ICD10-I70.0). Electronically Signed   By: Lovena Le M.D.   On: 04/19/2019 22:36   Dg Chest 2 View  Result Date: 04/10/2019 CLINICAL DATA:  Shortness of breath. EXAM: CHEST - 2 VIEW COMPARISON:  04/08/2019 and 08/18/2018 FINDINGS: Cardiac size is within normal limits. Aortic atherosclerosis. New pulmonary vascular congestion. New bilateral perihilar infiltrates. Persistent hazy infiltrate at the right lung base posteriorly. Minimal blunting of the posterior costophrenic angles. No acute bone abnormality. IMPRESSION:  New bilateral perihilar infiltrates. Persistent infiltrate at the right lung base. This could represent pulmonary edema or multifocal progressive pneumonia. Aortic Atherosclerosis (ICD10-I70.0). Electronically Signed   By: Lorriane Shire M.D.   On: 04/10/2019 11:04   Ct Head Wo Contrast  Result Date: 04/11/2019 CLINICAL DATA:  Transient ischemic attack.  Slurred speech. EXAM: CT HEAD WITHOUT CONTRAST TECHNIQUE: Contiguous axial images were obtained from the base of the skull through the vertex without intravenous contrast. COMPARISON:  CT scan of September 05, 2014. FINDINGS: Brain: No evidence of acute infarction, hemorrhage, hydrocephalus, extra-axial  collection or mass lesion/mass effect. Vascular: No hyperdense vessel or unexpected calcification. Skull: Normal. Negative for fracture or focal lesion. Sinuses/Orbits: No acute finding. Other: None. IMPRESSION: No acute intracranial abnormality seen. Electronically Signed   By: Marijo Conception M.D.   On: 04/11/2019 12:08   Ct Chest Wo Contrast  Result Date: 04/20/2019 CLINICAL DATA:  Masslike opacity seen in the lower lobes on abdomen CT yesterday. EXAM: CT CHEST WITHOUT CONTRAST TECHNIQUE: Multidetector CT imaging of the chest was performed following the standard protocol without IV contrast. COMPARISON:  Abdomen CT 04/19/2019 FINDINGS: Cardiovascular: Heart is enlarged. Coronary artery calcification is evident. Atherosclerotic calcification is noted in the wall of the thoracic aorta. Right IJ central line tip is in the distal SVC. Mediastinum/Nodes: Upper normal to borderline enlarged precarinal and subcarinal lymph nodes are identified. Precarinal 10 mm node identified on 52/2 in there is 11 mm subcarinal node on 63/2. No evidence for gross hilar lymphadenopathy although assessment is limited by the lack of intravenous contrast on today's study. The esophagus has normal imaging features. There is no axillary lymphadenopathy. Lungs/Pleura: Collapse/consolidative changes seen in both lower lobes tracking from the inferior hilar regions posteriorly and inferiorly into the lung bases. Probable atelectasis anterior right upper lobe. No overt pulmonary edema. Small bilateral pleural effusions noted. Upper Abdomen: Unremarkable. Musculoskeletal: No worrisome lytic or sclerotic osseous abnormality. IMPRESSION: Bilateral lower lobe collapse/consolidation compatible with pneumonia. Small bilateral pleural effusions. Upper normal to borderline enlarged central mediastinal lymph nodes, presumably reactive. Follow-up CT chest in 3 months could be used to ensure stability. Electronically Signed   By: Misty Stanley M.D.    On: 04/20/2019 17:30   Dg Chest Port 1 View  Result Date: 04/20/2019 CLINICAL DATA:  Central line placement EXAM: PORTABLE CHEST 1 VIEW COMPARISON:  04/15/2019 FINDINGS: Limited rotated portable exam to the left. Right IJ central line tip projects over the spine related to the rotation with the tip in the mid right atrium level. This can be retracted 3.5 cm toi the SVC RA junction. Once completed, consider repeat chest x-ray without rotation to confirm tip position. Stable cardiomegaly and vascular congestion. Lower lung volumes with increased bibasilar atelectasis/airspace disease worse in the left lower lobe obscuring the left hemidiaphragm. Small left effusion not excluded. No pneumothorax. Aorta atherosclerotic. IMPRESSION: Right IJ central line tip as detailed above. See above comment and recommendation. Cardiomegaly with vascular congestion Lower lung volumes with increased basilar atelectasis/consolidation worse on the left. Electronically Signed   By: Jerilynn Mages.  Shick M.D.   On: 04/20/2019 10:58   Dg Chest Port 1 View  Result Date: 04/15/2019 CLINICAL DATA:  Shortness of breath. EXAM: PORTABLE CHEST 1 VIEW COMPARISON:  Radiographs of April 13, 2019. FINDINGS: Stable cardiomegaly. No pneumothorax or definite pleural effusion is noted. Stable bilateral lung opacities are noted concerning for edema or inflammation. Bony thorax is unremarkable. IMPRESSION: Stable bilateral lung opacities as described above. Electronically Signed  By: Marijo Conception M.D.   On: 04/15/2019 07:31   Dg Chest Port 1 View  Result Date: 04/13/2019 CLINICAL DATA:  Shortness of breath and hypoxia in a patient with a history of end-stage renal disease. EXAM: PORTABLE CHEST 1 VIEW COMPARISON:  Single-view of the chest 04/11/2019. PA and lateral chest 04/10/2019. FINDINGS: Bilateral airspace disease has improved but there is persistent focal airspace opacity in the right mid and lower lung zones. There is cardiomegaly. No  pneumothorax. Small bilateral pleural effusions. IMPRESSION: Persistent but improved bilateral airspace disease compatible with decreased pulmonary edema. More focal airspace opacity in the right midlung could be due to edema or pneumonia. Electronically Signed   By: Inge Rise M.D.   On: 04/13/2019 09:08   Dg Chest Port 1 View  Result Date: 04/11/2019 CLINICAL DATA:  Increasing shortness of breath following dialysis. EXAM: PORTABLE CHEST 1 VIEW COMPARISON:  04/10/2019. FINDINGS: Poor inspiration. Progressive enlargement of the cardiac silhouette with increased prominence of the pulmonary vasculature interstitial markings. Progressive bilateral airspace opacities, greater on the right. No definite pleural fluid seen. Unremarkable bones. IMPRESSION: 1. Progressive cardiomegaly and changes of congestive heart failure. 2. Progressive bilateral alveolar edema or pneumonia, greater on the right. Electronically Signed   By: Claudie Revering M.D.   On: 04/11/2019 19:54   Dg Chest Port 1 View  Result Date: 04/08/2019 CLINICAL DATA:  Difficulty breathing for past few days, dyspnea on exertion, denies fever, history atrial fibrillation, heart failure, end-stage renal disease on dialysis, systemic hypertension, uterine cancer EXAM: PORTABLE CHEST 1 VIEW COMPARISON:  Portable exam 1645 hours compared to 08/18/2018 FINDINGS: Enlargement of cardiac silhouette. Mediastinal contours and pulmonary vascularity normal. Atherosclerotic calcification aorta. RIGHT lower lobe consolidation consistent with pneumonia. Remaining lungs clear. No pleural effusion or pneumothorax. Bones demineralized. IMPRESSION: Enlargement of cardiac silhouette. RIGHT lower lobe consolidation consistent with pneumonia. Electronically Signed   By: Lavonia Dana M.D.   On: 04/08/2019 17:18    Orson Eva, DO  [facility] Pager [phone]  If 7PM-7AM, please contact night-coverage [email or web address] Password [email or web address] 04/21/2019, 8:49 AM   LOS: 11  days

## 2019-04-21 NOTE — Progress Notes (Signed)
Subjective:  Seen on HD this AM.  BP had been soft and appears lower this AM , still in Afib.  I am afraid not going to be able to remove any fluid today with HD-- she denies specific complaints  Objective Vital signs in last 24 hours: Vitals:   04/21/19 0900 04/21/19 0915 04/21/19 0930 04/21/19 0945  BP: (!) 84/50 (!) 80/56 (!) 78/53 (!) 89/44  Pulse: 87 95 96 95  Resp: (!) 22 19 (!) 21 20  Temp: 98 F (36.7 C)     TempSrc: Oral     SpO2: 95%     Weight: 100.5 kg     Height:       Weight change:  No intake or output data in the 24 hours ending 04/21/19 0954  [facility]: [id number] 2K/2.5CaTime 3.5 hrsHeparin none. AccessLAVFBFR 400DFR 600  Hectoral 9.25mcg IV/HD Epogen 2,800Units IV/HD     Assessment/ Plan: Pt is a 78 y.o. yo female with ESRD who was admitted on 04/08/2019 with initially SOB-- able to pull volume with HD but then went into Afib and now with elevated WBC  Assessment/Plan: 1. ID-- initially on Abx for PNA- improved and abx were stopped.  Now with elevated WBC and concern for colitis, abx resumed- per primary team 2. ESRD - normally MWF via AVF-  Initially successful with needed volume removal down to as low as 93.5 from 110.  Today 100.5- not obviously volume overloaded, good O2 sat on 1.5 liters.  Will likely only get cleaning today  3. Anemia- hgb staying above 10 on maintenance epo  4. HTN/volume- as above- volume down overall this hosp-  No volume removal today - will increase midodrine to 10 mg pre HD.  Afib is complicating  5. [place]-  Previous hypercalcemia- vit D on hold, has improved.  Phos is high on renvela  6. Dispo- agree with goals of care discussion   Louis Meckel    Labs: Basic Metabolic Panel: Recent Labs  Lab 04/19/19 0522 04/20/19 0510 04/21/19 0453  NA 136 133* 133*  K 4.6 4.5 4.5  CL 95* 92* 92*  CO2 27 23 24   GLUCOSE 124* 117* 100*  BUN 35* 63* 85*  CREATININE  4.89* 7.49* 9.19*  CALCIUM 10.0 9.7 9.4  PHOS 6.5* 8.2* 9.6*   Liver Function Tests: Recent Labs  Lab 04/19/19 0522 04/20/19 0510 04/21/19 0453  ALBUMIN 3.4* 2.9* 2.5*   No results for input(s): LIPASE, AMYLASE in the last 168 hours. No results for input(s): AMMONIA in the last 168 hours. CBC: Recent Labs  Lab 04/18/19 0456 04/19/19 0521 04/19/19 1621 04/20/19 0510 04/21/19 0453  WBC 10.6* 21.3* 23.2* 23.8* 19.9*  HGB 10.1* 11.4* 11.4* 10.8* 10.0*  HCT 31.7* 36.0 35.6* 33.6* 31.4*  MCV 109.7* 110.8* 110.2* 111.3* 109.0*  PLT 271 318 294 247 226   Cardiac Enzymes: No results for input(s): CKTOTAL, CKMB, CKMBINDEX, TROPONINI in the last 168 hours. CBG: No results for input(s): GLUCAP in the last 168 hours.  Iron Studies: No results for input(s): IRON, TIBC, TRANSFERRIN, FERRITIN in the last 72 hours. Studies/Results: Ct Abdomen Pelvis Wo Contrast  Result Date: 04/19/2019 CLINICAL DATA:  Abdominal pain, gastroenteritis or colitis suspected EXAM: CT ABDOMEN AND PELVIS WITHOUT CONTRAST TECHNIQUE: Multidetector CT imaging of the abdomen and pelvis was performed following the standard protocol without IV contrast. COMPARISON:  None. FINDINGS: Lower chest: Multifocal consolidative opacities are present in the lung bases. More masslike consolidation is present  in the left lower lobe. Diffuse airways thickening is noted in the bases. Dependent atelectatic changes noted posteriorly. Trace effusions are present as well. There is cardiomegaly with biatrial enlargement. Atherosclerotic calcification of the coronary arteries. Atherosclerotic calcifications are present upon the aortic leaflets and within the mitral valve annulus. Hepatobiliary: No focal liver abnormality is seen. Patient is post cholecystectomy. Slight prominence of the biliary tree likely related to reservoir effect. No calcified intraductal gallstones. Pancreas: Unremarkable. No pancreatic ductal dilatation or surrounding  inflammatory changes. Spleen: Normal in size without focal abnormality. Adrenals/Urinary Tract: Normal adrenal glands. Severe atrophy of both kidneys with multiple vascular calcifications in the renal hila and few lobular subcentimeter renal lesions likely reflecting cysts. These are too small to fully characterize on CT imaging but statistically likely benign. Stomach/Bowel: Distal esophagus, stomach and duodenal sweep are unremarkable. No bowel wall thickening or dilatation. No evidence of obstruction. A normal appendix is visualized. There is segmental thickening and adjacent pericolonic hazy mesenteric stranding involving the transverse colon and proximal descending colon. There are numerous pancolonic diverticula however inflammation is not appear centered upon a focal culprit diverticulum. No resulting obstruction. Vascular/Lymphatic: Severe atherosclerotic calcification. Evaluation of vascular patency is limited by the absence of intravenous contrast. No suspicious or enlarged lymph nodes in the included lymphatic chains. Reproductive: Uterus is surgically absent. No concerning adnexal lesions. Other: No abdominopelvic free fluid or free gas. No bowel containing hernias. Fat containing ventral/umbilical hernia. Anterior abdominal wall laxity. Small amount of soft tissue stranding/edema along the left lateral abdominal wall. Musculoskeletal: Multilevel degenerative changes are present in the imaged portions of the spine. IMPRESSION: 1. Segmental thickening and adjacent pericolonic hazy mesenteric stranding involving the transverse colon and proximal descending colon, consistent with colitis, which could be infectious, inflammatory or potentially vascular in etiology. Evaluation of the vasculature is limited in the absence of contrast media. 2. Multifocal areas of airspace opacity in both lower lobes. More masslike consolidation is present in the left lung base. Recommend reimaging following therapeutic  intervention to ensure resolution. 3. Extensive pancolonic diverticulosis without evidence of acute diverticulitis. 4. Bilateral renal atrophy. Scattered cystic lesions in both kidneys, too small characterize on CT imaging. 5. Small fat containing ventral hernia. 6. Aortic Atherosclerosis (ICD10-I70.0). Electronically Signed   By: Lovena Le M.D.   On: 04/19/2019 22:36   Ct Chest Wo Contrast  Result Date: 04/20/2019 CLINICAL DATA:  Masslike opacity seen in the lower lobes on abdomen CT yesterday. EXAM: CT CHEST WITHOUT CONTRAST TECHNIQUE: Multidetector CT imaging of the chest was performed following the standard protocol without IV contrast. COMPARISON:  Abdomen CT 04/19/2019 FINDINGS: Cardiovascular: Heart is enlarged. Coronary artery calcification is evident. Atherosclerotic calcification is noted in the wall of the thoracic aorta. Right IJ central line tip is in the distal SVC. Mediastinum/Nodes: Upper normal to borderline enlarged precarinal and subcarinal lymph nodes are identified. Precarinal 10 mm node identified on 52/2 in there is 11 mm subcarinal node on 63/2. No evidence for gross hilar lymphadenopathy although assessment is limited by the lack of intravenous contrast on today's study. The esophagus has normal imaging features. There is no axillary lymphadenopathy. Lungs/Pleura: Collapse/consolidative changes seen in both lower lobes tracking from the inferior hilar regions posteriorly and inferiorly into the lung bases. Probable atelectasis anterior right upper lobe. No overt pulmonary edema. Small bilateral pleural effusions noted. Upper Abdomen: Unremarkable. Musculoskeletal: No worrisome lytic or sclerotic osseous abnormality. IMPRESSION: Bilateral lower lobe collapse/consolidation compatible with pneumonia. Small bilateral pleural effusions. Upper normal to  borderline enlarged central mediastinal lymph nodes, presumably reactive. Follow-up CT chest in 3 months could be used to ensure stability.  Electronically Signed   By: Misty Stanley M.D.   On: 04/20/2019 17:30   Dg Chest Port 1 View  Result Date: 04/20/2019 CLINICAL DATA:  Central line placement EXAM: PORTABLE CHEST 1 VIEW COMPARISON:  04/15/2019 FINDINGS: Limited rotated portable exam to the left. Right IJ central line tip projects over the spine related to the rotation with the tip in the mid right atrium level. This can be retracted 3.5 cm toi the SVC RA junction. Once completed, consider repeat chest x-ray without rotation to confirm tip position. Stable cardiomegaly and vascular congestion. Lower lung volumes with increased bibasilar atelectasis/airspace disease worse in the left lower lobe obscuring the left hemidiaphragm. Small left effusion not excluded. No pneumothorax. Aorta atherosclerotic. IMPRESSION: Right IJ central line tip as detailed above. See above comment and recommendation. Cardiomegaly with vascular congestion Lower lung volumes with increased basilar atelectasis/consolidation worse on the left. Electronically Signed   By: Jerilynn Mages.  Shick M.D.   On: 04/20/2019 10:58   Medications: Infusions: . sodium chloride    . sodium chloride    . amiodarone 30 mg/hr (04/21/19 0827)  . piperacillin-tazobactam (ZOSYN)  IV 3.375 g (04/21/19 0836)  . vancomycin      Scheduled Medications: . budesonide (PULMICORT) nebulizer solution  0.5 mg Nebulization BID  . Chlorhexidine Gluconate Cloth  6 each Topical Daily  . Chlorhexidine Gluconate Cloth  6 each Topical Q0600  . Chlorhexidine Gluconate Cloth  6 each Topical Q0600  . Chlorhexidine Gluconate Cloth  6 each Topical Q0600  . epoetin (EPOGEN/PROCRIT) injection  2,800 Units Intravenous Q M,W,F-HD  . gabapentin  100 mg Oral BID  . ipratropium  0.5 mg Nebulization TID  . levalbuterol  1.25 mg Nebulization TID  . mouth rinse  15 mL Mouth Rinse BID  . metoprolol tartrate  25 mg Oral BID  . midodrine  5 mg Oral Q M,W,F-HD  . pantoprazole  40 mg Oral Daily  . phytonadione  2.5 mg  Oral Once  . polyethylene glycol  17 g Oral Daily  . senna  2 tablet Oral Daily  . sevelamer carbonate  2,400 mg Oral TID WC  . sodium chloride flush  10-40 mL Intracatheter Q12H  . Warfarin - Pharmacist Dosing Inpatient   Does not apply q1800    have reviewed scheduled and prn medications.  Physical Exam: General: pale, slightly slow mentally NAD Heart: irreg Lungs: dec BS at bases Abdomen: soft, non tender Extremities: trace pitting edema Dialysis Access: left AVF- accessed     04/21/2019,9:54 AM  LOS: 11 days

## 2019-04-21 NOTE — Consult Note (Signed)
Referring Provider: Dr. Carles Collet Primary Care Physician:  Redmond School, MD Primary Gastroenterologist:  Dr. Gala Romney   Date of Admission: 04/08/2019 Date of Consultation: 04/21/2019  Reason for Consultation:  Colitis   HPI:  Sabrina Beck is a 78 y.o. year old female with multiple comorbidities, presenting with sepsis in setting of pneumonia, acute respiratory failure, slow to improve this hospitalization. CT abd/pelvis without contrast was obtained 9/12 showing segmental thickening and hazy mesenteric stranding of transverse colon and proximal descending colon. GI consulted for any further recommendations.  Patient states she has had a poor appetite prior to admission. Denies any postprandial abdominal pain. Denies abdominal pain. No N/V. Feels tired. No melena or hematochezia. States she hasn't had a BM in several days but is not uncomfortable. Notes dysphagia if eating bread or tough textures. No prior EGD. Colon[place] completed [place] ago [place].   Hypotensive this morning. Palliative care has been consulted to help with goals of care.   Past Medical History:  Diagnosis Date  . Anemia   . Atrial fibrillation ([place])   . Chest pain 05/15/2006   Stress test negative for ischemia  . Diastolic heart failure ([place])   . Diverticulosis   . ESRD on hemodialysis ([place])   . GERD (gastroesophageal reflux disease)   . Headache   . Mixed hyperlipidemia   . Peripheral neuropathy   . PONV (postoperative nausea and vomiting)   . Systemic hypertension   . Uterine cancer [facility])     Past Surgical History:  Procedure Laterality Date  . ABDOMINAL HYSTERECTOMY    . APPENDECTOMY    . AV FISTULA PLACEMENT Left 11/17/2013   Procedure: CREATION OF LEFT RADIOCEPHALIC ARTERIOVENOUS (AV) FISTULA ;  Surgeon: [name], MD;  Location: [place];  Service: Vascular;  Laterality: Left;  . BREAST SURGERY Left    tumors non canerous 2- 3 different surgeries  . CATARACT EXTRACTION W/PHACO Right  10/26/2016   Procedure: CATARACT EXTRACTION PHACO AND INTRAOCULAR LENS PLACEMENT (IOC) CDE= 11.91;  Surgeon: Tonny Branch, MD;  Location: [facility];  Service: Ophthalmology;  Laterality: Right;  right - pt knows to arrive at 6:30  . CATARACT EXTRACTION W/PHACO Left 11/30/2016   Procedure: CATARACT EXTRACTION PHACO AND INTRAOCULAR LENS PLACEMENT (IOC) CDE - 7.92 ;  Surgeon: Tonny Branch, MD;  Location: [facility];  Service: Ophthalmology;  Laterality: Left;  left  . CHOLECYSTECTOMY    . COLONOSCOPY W/ BIOPSIES AND POLYPECTOMY    . FISTULA SUPERFICIALIZATION Left 05/19/2014   Procedure: FISTULA SUPERFICIALIZATION-LEFT ARM;  Surgeon: [name], MD;  Location: [place];  Service: Vascular;  Laterality: Left;  . INSERTION OF DIALYSIS CATHETER N/A 01/24/2014   Procedure: INSERTION OF DIALYSIS CATHETER;  Surgeon: Rosetta Posner, MD;  Location: [facility] OR;  Service: Vascular;  Laterality: N/A;  . KNEE ARTHROSCOPY    . TUBAL LIGATION      Prior to Admission medications   Medication Sig Start Date End Date Taking? Authorizing Provider  acetaminophen (TYLENOL) 500 MG tablet Take 1,000 mg by mouth every 6 (six) hours as needed for moderate pain.    Yes [provider]  Cholecalciferol (VITAMIN D) 2000 UNITS CAPS Take 2,000 Units by mouth every evening.    Yes [provider]  gabapentin (NEURONTIN) 100 MG capsule Take 100 mg by mouth 2 (two) times daily.  09/13/18  Yes [provider]  Multiple Vitamins-Minerals (PRESERVISION AREDS 2 PO) Take 1 tablet by mouth 2 (two) times daily.  Yes [provider]  multivitamin (RENA-VIT) TABS tablet Take 1 tablet by mouth every evening.  07/06/18  Yes [provider]  Omega-3 Fatty Acids (OMEGA-3 FISH OIL) 300 MG CAPS Take 300 mg by mouth 2 (two) times daily.   Yes [provider]  omeprazole (PRILOSEC) 20 MG capsule Take 20 mg by mouth every morning.    Yes [provider]  Propylene Glycol (SYSTANE BALANCE) 0.6 % SOLN  Place 1 drop into both eyes 3 (three) times daily as needed (for burning or dry eyes).    Yes [provider]  RENVELA 800 MG tablet Take 2,400 mg by mouth 3 (three) times daily with meals. Takes 1 tablet with snacks 04/17/15  Yes [provider]  warfarin (COUMADIN) 5 MG tablet Take 1.5 to 2 tablets by mouth daily as directed by coumadin clinic Patient taking differently: Take 7.5-10 mg by mouth See admin instructions. 10mg  (10mg  total) on Mondays, then take 7.5mg  (1.5 tablets) on all other days in the evening. 10/15/18  Yes Lendon Colonel, NP  metoprolol tartrate (LOPRESSOR) 50 MG tablet Take 1 tablet by mouth twice daily 04/18/19   Lendon Colonel, NP    Current Facility-Administered Medications  Medication Dose Route Frequency Provider Last Rate Last Dose  . 0.9 %  sodium chloride infusion  100 mL Intravenous PRN Barton Dubois, MD      . 0.9 %  sodium chloride infusion  100 mL Intravenous PRN Barton Dubois, MD      . acetaminophen (TYLENOL) tablet 1,000 mg  1,000 mg Oral Q6H PRN Barton Dubois, MD   1,000 mg at 04/20/19 2009  . albumin human 25 % solution 25 g  25 g Intravenous Once Corliss Parish, MD      . albuterol (PROVENTIL) (2.5 MG/3ML) 0.083% nebulizer solution 2.5 mg  2.5 mg Inhalation Q2H PRN Truett Mainland, DO      . amiodarone (NEXTERONE PREMIX) 360-4.14 MG/200ML-% (1.8 mg/mL) IV infusion  30 mg/hr Intravenous Continuous Arnoldo Lenis, MD 16.67 mL/hr at 04/21/19 0827 30 mg/hr at 04/21/19 0827  . budesonide (PULMICORT) nebulizer solution 0.5 mg  0.5 mg Nebulization BID Barton Dubois, MD   0.5 mg at 04/21/19 0843  . Chlorhexidine Gluconate Cloth 2 % PADS 6 each  6 each Topical Daily Barton Dubois, MD   6 each at 04/21/19 [phone]  . Chlorhexidine Gluconate Cloth 2 % PADS 6 each  6 each Topical Q0600 Rosita Fire, MD   6 each at 04/21/19 [phone]  . Chlorhexidine Gluconate Cloth 2 % PADS 6 each  6 each Topical Q0600 Claudia Desanctis, MD   6 each  at 04/21/19 [phone]  . Chlorhexidine Gluconate Cloth 2 % PADS 6 each  6 each Topical Q0600 Corliss Parish, MD   6 each at 04/21/19 0511  . epoetin alfa (EPOGEN) injection 2,800 Units  2,800 Units Intravenous Q M,W,F-HD Barton Dubois, MD   2,800 Units at 04/18/19 1317  . gabapentin (NEURONTIN) capsule 100 mg  100 mg Oral BID Barton Dubois, MD   100 mg at 04/21/19 0900  . HYDROcodone-acetaminophen (NORCO/VICODIN) 5-325 MG per tablet 1-2 tablet  1-2 tablet Oral Q6H PRN Vertis Kelch, NP   2 tablet at 04/21/19 0322  . iohexol (OMNIPAQUE) 300 MG/ML solution 30 mL  30 mL Oral Once PRN Tat, David, MD      . ipratropium (ATROVENT) nebulizer solution 0.5 mg  0.5 mg Nebulization TID Tat, Shanon Brow, MD   0.5 mg at  04/21/19 0839  . levalbuterol (XOPENEX) nebulizer solution 1.25 mg  1.25 mg Nebulization TID Orson Eva, MD   1.25 mg at 04/21/19 0839  . lidocaine (PF) (XYLOCAINE) 1 % injection 5 mL  5 mL Intradermal PRN Barton Dubois, MD      . lidocaine-prilocaine (EMLA) cream 1 application  1 application Topical PRN Barton Dubois, MD      . MEDLINE mouth rinse  15 mL Mouth Rinse BID Barton Dubois, MD   15 mL at 04/21/19 0837  . metoprolol tartrate (LOPRESSOR) tablet 25 mg  25 mg Oral BID Tat, David, MD      . midodrine (PROAMATINE) tablet 5 mg  5 mg Oral Q M,W,F-HD Claudia Desanctis, MD   5 mg at 04/18/19 1228  . ondansetron (ZOFRAN) injection 4 mg  4 mg Intravenous Q6H PRN Barton Dubois, MD   4 mg at 04/18/19 1419  . pantoprazole (PROTONIX) EC tablet 40 mg  40 mg Oral Daily Barton Dubois, MD   40 mg at 04/21/19 0900  . pentafluoroprop-tetrafluoroeth (GEBAUERS) aerosol 1 application  1 application Topical PRN Barton Dubois, MD      . phytonadione (VITAMIN K) tablet 2.5 mg  2.5 mg Oral Once Tat, David, MD      . piperacillin-tazobactam (ZOSYN) IVPB 3.375 g  3.375 g Intravenous Therisa Doyne, MD 12.5 mL/hr at 04/21/19 0836 3.375 g at 04/21/19 0836  . polyethylene glycol (MIRALAX / GLYCOLAX) packet  17 g  17 g Oral Daily Tat, David, MD   17 g at 04/21/19 0900  . senna (SENOKOT) tablet 17.2 mg  2 tablet Oral Daily Tat, David, MD   17.2 mg at 04/21/19 0900  . sevelamer carbonate (RENVELA) tablet 2,400 mg  2,400 mg Oral TID WC Barton Dubois, MD   2,400 mg at 04/21/19 0828  . sodium chloride flush (NS) 0.9 % injection 10-40 mL  10-40 mL Intracatheter Q12H Aviva Signs, MD   10 mL at 04/21/19 0837  . sodium chloride flush (NS) 0.9 % injection 10-40 mL  10-40 mL Intracatheter PRN Aviva Signs, MD      . vancomycin (VANCOCIN) IVPB 1000 mg/200 mL premix  1,000 mg Intravenous Q M,W,F-1800 Orson Eva, MD      . Warfarin - Pharmacist Dosing Inpatient   Does not apply [id number] Barton Dubois, MD        Allergies as of 04/08/2019  . (No Known Allergies)    Family History  Problem Relation Age of Onset  . Diabetes Mother   . Hypertension Mother   . Diabetes Father   . Heart disease Father        before age 25  . Heart attack Father   . Diabetes Sister   . Hyperlipidemia Sister   . Hypertension Sister   . Cancer Brother   . Diabetes Brother   . Heart disease Brother   . Hypertension Brother   . Heart attack Brother     Social History   Socioeconomic History  . Marital status: Widowed    Spouse name: Not on file  . Number of children: Not on file  . Years of education: Not on file  . Highest education level: Not on file  Occupational History  . Not on file  Social Needs  . Financial resource strain: Not on file  . Food insecurity    Worry: Not on file    Inability: Not on file  . Transportation needs    Medical: Not on file  Non-medical: Not on file  Tobacco Use  . Smoking status: Former Smoker    Quit date: 08/08/1999    Years since quitting: 19.7  . Smokeless tobacco: Never Used  Substance and Sexual Activity  . Alcohol use: No    Alcohol/week: 0.0 standard drinks  . Drug use: No  . Sexual activity: Not Currently  Lifestyle  . Physical activity    Days per week:  Not on file    Minutes per session: Not on file  . Stress: Not on file  Relationships  . Social Herbalist on phone: Not on file    Gets together: Not on file    Attends religious service: Not on file    Active member of club or organization: Not on file    Attends meetings of clubs or organizations: Not on file    Relationship status: Not on file  . Intimate partner violence    Fear of current or ex partner: Not on file    Emotionally abused: Not on file    Physically abused: Not on file    Forced sexual activity: Not on file  Other Topics Concern  . Not on file  Social History Narrative   Lives in a one story home.  One of her sons lives with her.  Has 2 sons.  Retired Oncologist.  Eduction: high school.    Review of Systems: Gen: see HPI CV: Denies chest pain, heart palpitations, syncope, edema  Resp: see HPI GI:see HPI  GU : Denies urinary burning, urinary frequency, urinary incontinence.  MS: Denies joint pain,swelling, cramping Derm: Denies rash, itching, dry skin Psych: Denies depression, anxiety,confusion, or memory loss Heme: see HPI.  Physical Exam: Vital signs in last 24 hours: Temp:  [97.7 F (36.5 C)-98.1 F (36.7 C)] 97.7 F (36.5 C) (09/14 0757) Pulse Rate:  [85-101] 94 (09/14 0200) Resp:  [17-25] 25 (09/14 0200) BP: (78-107)/(42-71) 82/49 (09/14 0200) SpO2:  [91 %-99 %] 98 % (09/14 0843) Last BM Date: 04/17/19 General:   Alert and oriented, chronically ill-appearing and weak Head:  Normocephalic and atraumatic. Eyes:  Sclera clear, no icterus Ears:  Normal auditory acuity. Nose:  No deformity, discharge,  or lesions. Lungs:  Scattered rhonchi Heart:  S1 S2 present, irregularly irregular. Abdomen:  Obese, soft, +BS, nontender and nondistended. No masses, hepatosplenomegaly or hernias noted.  Rectal:  Deferred  Msk:  Symmetrical without gross deformities.  Extremities:  Without edema. Neurologic:  Alert and  oriented  x4  Intake/Output from previous day: No intake/output data recorded. Intake/Output this shift: No intake/output data recorded.  Lab Results: Recent Labs    04/19/19 1621 04/20/19 0510 04/21/19 0453  WBC 23.2* 23.8* 19.9*  HGB 11.4* 10.8* 10.0*  HCT 35.6* 33.6* 31.4*  PLT 294 247 226   BMET Recent Labs    04/19/19 0522 04/20/19 0510 04/21/19 0453  NA 136 133* 133*  K 4.6 4.5 4.5  CL 95* 92* 92*  CO2 27 23 24   GLUCOSE 124* 117* 100*  BUN 35* 63* 85*  CREATININE 4.89* 7.49* 9.19*  CALCIUM 10.0 9.7 9.4   LFT Recent Labs    04/19/19 0522 04/20/19 0510 04/21/19 0453  ALBUMIN 3.4* 2.9* 2.5*   PT/INR Recent Labs    04/20/19 0510 04/21/19 0453  LABPROT 35.2* 40.6*  INR 3.6* 4.3*    Studies/Results: Ct Abdomen Pelvis Wo Contrast  Result Date: 04/19/2019 CLINICAL DATA:  Abdominal pain, gastroenteritis or colitis suspected EXAM: CT ABDOMEN AND  PELVIS WITHOUT CONTRAST TECHNIQUE: Multidetector CT imaging of the abdomen and pelvis was performed following the standard protocol without IV contrast. COMPARISON:  None. FINDINGS: Lower chest: Multifocal consolidative opacities are present in the lung bases. More masslike consolidation is present in the left lower lobe. Diffuse airways thickening is noted in the bases. Dependent atelectatic changes noted posteriorly. Trace effusions are present as well. There is cardiomegaly with biatrial enlargement. Atherosclerotic calcification of the coronary arteries. Atherosclerotic calcifications are present upon the aortic leaflets and within the mitral valve annulus. Hepatobiliary: No focal liver abnormality is seen. Patient is post cholecystectomy. Slight prominence of the biliary tree likely related to reservoir effect. No calcified intraductal gallstones. Pancreas: Unremarkable. No pancreatic ductal dilatation or surrounding inflammatory changes. Spleen: Normal in size without focal abnormality. Adrenals/Urinary Tract: Normal adrenal  glands. Severe atrophy of both kidneys with multiple vascular calcifications in the renal hila and few lobular subcentimeter renal lesions likely reflecting cysts. These are too small to fully characterize on CT imaging but statistically likely benign. Stomach/Bowel: Distal esophagus, stomach and duodenal sweep are unremarkable. No bowel wall thickening or dilatation. No evidence of obstruction. A normal appendix is visualized. There is segmental thickening and adjacent pericolonic hazy mesenteric stranding involving the transverse colon and proximal descending colon. There are numerous pancolonic diverticula however inflammation is not appear centered upon a focal culprit diverticulum. No resulting obstruction. Vascular/Lymphatic: Severe atherosclerotic calcification. Evaluation of vascular patency is limited by the absence of intravenous contrast. No suspicious or enlarged lymph nodes in the included lymphatic chains. Reproductive: Uterus is surgically absent. No concerning adnexal lesions. Other: No abdominopelvic free fluid or free gas. No bowel containing hernias. Fat containing ventral/umbilical hernia. Anterior abdominal wall laxity. Small amount of soft tissue stranding/edema along the left lateral abdominal wall. Musculoskeletal: Multilevel degenerative changes are present in the imaged portions of the spine. IMPRESSION: 1. Segmental thickening and adjacent pericolonic hazy mesenteric stranding involving the transverse colon and proximal descending colon, consistent with colitis, which could be infectious, inflammatory or potentially vascular in etiology. Evaluation of the vasculature is limited in the absence of contrast media. 2. Multifocal areas of airspace opacity in both lower lobes. More masslike consolidation is present in the left lung base. Recommend reimaging following therapeutic intervention to ensure resolution. 3. Extensive pancolonic diverticulosis without evidence of acute diverticulitis. 4.  Bilateral renal atrophy. Scattered cystic lesions in both kidneys, too small characterize on CT imaging. 5. Small fat containing ventral hernia. 6. Aortic Atherosclerosis (ICD10-I70.0). Electronically Signed   By: Lovena Le M.D.   On: 04/19/2019 22:36   Ct Chest Wo Contrast  Result Date: 04/20/2019 CLINICAL DATA:  Masslike opacity seen in the lower lobes on abdomen CT yesterday. EXAM: CT CHEST WITHOUT CONTRAST TECHNIQUE: Multidetector CT imaging of the chest was performed following the standard protocol without IV contrast. COMPARISON:  Abdomen CT 04/19/2019 FINDINGS: Cardiovascular: Heart is enlarged. Coronary artery calcification is evident. Atherosclerotic calcification is noted in the wall of the thoracic aorta. Right IJ central line tip is in the distal SVC. Mediastinum/Nodes: Upper normal to borderline enlarged precarinal and subcarinal lymph nodes are identified. Precarinal 10 mm node identified on 52/2 in there is 11 mm subcarinal node on 63/2. No evidence for gross hilar lymphadenopathy although assessment is limited by the lack of intravenous contrast on today's study. The esophagus has normal imaging features. There is no axillary lymphadenopathy. Lungs/Pleura: Collapse/consolidative changes seen in both lower lobes tracking from the inferior hilar regions posteriorly and inferiorly into the lung bases.  Probable atelectasis anterior right upper lobe. No overt pulmonary edema. Small bilateral pleural effusions noted. Upper Abdomen: Unremarkable. Musculoskeletal: No worrisome lytic or sclerotic osseous abnormality. IMPRESSION: Bilateral lower lobe collapse/consolidation compatible with pneumonia. Small bilateral pleural effusions. Upper normal to borderline enlarged central mediastinal lymph nodes, presumably reactive. Follow-up CT chest in 3 months could be used to ensure stability. Electronically Signed   By: Misty Stanley M.D.   On: 04/20/2019 17:30   Dg Chest Port 1 View  Result Date:  04/20/2019 CLINICAL DATA:  Central line placement EXAM: PORTABLE CHEST 1 VIEW COMPARISON:  04/15/2019 FINDINGS: Limited rotated portable exam to the left. Right IJ central line tip projects over the spine related to the rotation with the tip in the mid right atrium level. This can be retracted 3.5 cm toi the SVC RA junction. Once completed, consider repeat chest x-ray without rotation to confirm tip position. Stable cardiomegaly and vascular congestion. Lower lung volumes with increased bibasilar atelectasis/airspace disease worse in the left lower lobe obscuring the left hemidiaphragm. Small left effusion not excluded. No pneumothorax. Aorta atherosclerotic. IMPRESSION: Right IJ central line tip as detailed above. See above comment and recommendation. Cardiomegaly with vascular congestion Lower lung volumes with increased basilar atelectasis/consolidation worse on the left. Electronically Signed   By: Jerilynn Mages.  Shick M.D.   On: 04/20/2019 10:58    Impression: 78 year old female with multiple comorbidities, presenting with acute respiratory failure and sepsis in setting of pneumonia. Non-contrast CT showed findings of colitis involving transverse and proximal descending colon. She denies any abdominal pain, rectal bleeding, postprandial abdominal pain at time of consultation today or previously. Most likely CT findings are due to ischemic colitis. Unable to adequately assess vasculature due to lack of IV contrast. Ongoing hypotension remains an issue. Palliative care has been consulted to assist with goals of care, which is appropriate in light of multiple health issues. Limited interventions available for patient at this time; would continue supportive measures.   Plan: Continue supportive measures Agree with palliative involvement Monitor for acute onset of abdominal pain   Annitta Needs, PhD, ANP-BC [facility]     LOS: 11 days    04/21/2019, 9:29 AM

## 2019-04-21 NOTE — Procedures (Signed)
Patient was seen on dialysis and the procedure was supervised.  BFR 400  Via AVF BP is  low.   Patient appears to be tolerating treatment well even though BP is low in spite of midodrine and albumin-  Likely to get cleaning only today , no UF   Sabrina Beck 04/21/2019

## 2019-04-21 NOTE — Progress Notes (Signed)
INR 4.3 critical value called at 0640 04/21/19. Reported to J.Kim, MD

## 2019-04-21 NOTE — Progress Notes (Signed)
Sabrina Beck for Coumadin Indication: atrial fibrillation  No Known Allergies  Patient Measurements: Height: 5\' 4"  (162.6 cm) Weight: 221 lb 9 oz (100.5 kg) IBW/kg (Calculated) : 54.7  Vital Signs: Temp: 98 F (36.7 C) (09/14 0900) Temp Source: Oral (09/14 0900) BP: 80/45 (09/14 1100) Pulse Rate: 96 (09/14 1100)  Labs: Recent Labs    04/19/19 0521 04/19/19 0522 04/19/19 1621 04/20/19 0510 04/21/19 0453  HGB 11.4*  --  11.4* 10.8* 10.0*  HCT 36.0  --  35.6* 33.6* 31.4*  PLT 318  --  294 247 226  LABPROT 27.7*  --   --  35.2* 40.6*  INR 2.6*  --   --  3.6* 4.3*  CREATININE  --  4.89*  --  7.49* 9.19*    Estimated Creatinine Clearance: 5.7 mL/min (A) (by C-G formula based on SCr of 9.19 mg/dL (H)).    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 She presented to the ED with shortness of breath.  Patient chronically anticoagulated with warfarin.    Home dose:  10mg  on Monday and 7.5mg  all other days. Drug Interactions: amiodarone  9/14 AM UPDATE:  INR is 4.3. Vitamin K 2.5 mg PO given.  Will hold warfarin until INR is < 3 and re-start at  lower dose due to amiodarone interaction/CHF exacerbation.  Goal of Therapy:  INR 2-3 Monitor platelets by anticoagulation protocol: Yes  Plan:  HOLD warfarin today  Daily PT-INR Monitor for s/s of bleeding  Margot Ables, PharmD Clinical Pharmacist 04/21/2019 11:02 AM

## 2019-04-21 NOTE — Procedures (Signed)
    HEMODIALYSIS TREATMENT NOTE:  After d/w Dr. Moshe Cipro, HD initiated with Albumin 25g bolus for BP 84/50.  NO UF.  Midodrine 5 mg was given 30 minutes prior to starting treatment.  SBP 80s to high 70s x 2hours, then dropped to 62/53.  200cc NS bolus was given. She was asymptomatic, alert, and conversing with her son and Palliative Care NP, Elmo Putt.  After 15 minutes, BP 63/50.  After d/w Dr. Moshe Cipro, an additional Albumin 25g was given.  SBP ^ to 90s and treatment was completed without further incident.    All blood was returned and hemostasis was achieved in 15 minutes.  +1000  Fluid balance.  Rockwell Alexandria, RN

## 2019-04-22 DIAGNOSIS — D689 Coagulation defect, unspecified: Secondary | ICD-10-CM

## 2019-04-22 LAB — CBC
HCT: 29 % — ABNORMAL LOW (ref 36.0–46.0)
Hemoglobin: 9.4 g/dL — ABNORMAL LOW (ref 12.0–15.0)
MCH: 35.1 pg — ABNORMAL HIGH (ref 26.0–34.0)
MCHC: 32.4 g/dL (ref 30.0–36.0)
MCV: 108.2 fL — ABNORMAL HIGH (ref 80.0–100.0)
Platelets: 201 10*3/uL (ref 150–400)
RBC: 2.68 MIL/uL — ABNORMAL LOW (ref 3.87–5.11)
RDW: 13.4 % (ref 11.5–15.5)
WBC: 14.4 10*3/uL — ABNORMAL HIGH (ref 4.0–10.5)
nRBC: 0 % (ref 0.0–0.2)

## 2019-04-22 LAB — CREATININE, SERUM
Creatinine, Ser: 5.69 mg/dL — ABNORMAL HIGH (ref 0.44–1.00)
GFR calc Af Amer: 8 mL/min — ABNORMAL LOW (ref >60)
GFR calc non Af Amer: 7 mL/min — ABNORMAL LOW (ref >60)

## 2019-04-22 LAB — RENAL FUNCTION PANEL
Albumin: 2.8 g/dL — ABNORMAL LOW (ref 3.5–5.0)
Anion gap: 16 — ABNORMAL HIGH (ref 5–15)
BUN: 39 mg/dL — ABNORMAL HIGH (ref 8–23)
CO2: 27 mmol/L (ref 22–32)
Calcium: 9.3 mg/dL (ref 8.9–10.3)
Chloride: 90 mmol/L — ABNORMAL LOW (ref 98–111)
Creatinine, Ser: 5.77 mg/dL — ABNORMAL HIGH (ref 0.44–1.00)
GFR calc Af Amer: 7 mL/min — ABNORMAL LOW (ref >60)
GFR calc non Af Amer: 6 mL/min — ABNORMAL LOW (ref >60)
Glucose, Bld: 100 mg/dL — ABNORMAL HIGH (ref 70–99)
Phosphorus: 5.8 mg/dL — ABNORMAL HIGH (ref 2.5–4.6)
Potassium: 3.1 mmol/L — ABNORMAL LOW (ref 3.5–5.1)
Sodium: 133 mmol/L — ABNORMAL LOW (ref 135–145)

## 2019-04-22 LAB — PROTIME-INR
INR: 3 — ABNORMAL HIGH (ref 0.8–1.2)
Prothrombin Time: 30.5 seconds — ABNORMAL HIGH (ref 11.4–15.2)

## 2019-04-22 MED ORDER — NEPRO/CARBSTEADY PO LIQD
237.0000 mL | Freq: Every day | ORAL | Status: DC
  Administered 2019-04-22 – 2019-04-24 (×3): 237 mL via ORAL

## 2019-04-22 MED ORDER — POTASSIUM CHLORIDE CRYS ER 20 MEQ PO TBCR
40.0000 meq | EXTENDED_RELEASE_TABLET | Freq: Once | ORAL | Status: AC
  Administered 2019-04-22: 10:00:00 40 meq via ORAL
  Filled 2019-04-22: qty 2

## 2019-04-22 MED ORDER — WARFARIN SODIUM 2 MG PO TABS
3.0000 mg | ORAL_TABLET | Freq: Once | ORAL | Status: AC
  Administered 2019-04-22: 3 mg via ORAL
  Filled 2019-04-22: qty 1

## 2019-04-22 MED ORDER — CHLORHEXIDINE GLUCONATE CLOTH 2 % EX PADS: 6.0000 | MEDICATED_PAD | Freq: Every day | CUTANEOUS | Status: DC

## 2019-04-22 NOTE — Progress Notes (Addendum)
ANTICOAGULATION CONSULT NOTE   Pharmacy Consult for Coumadin Indication: atrial fibrillation  No Known Allergies  Patient Measurements: Height: 5\' 4"  (162.6 cm) Weight: 223 lb 12.3 oz (101.5 kg) IBW/kg (Calculated) : 54.7  Vital Signs: Temp: 98.5 F (36.9 C) (09/15 1112) Temp Source: Oral (09/15 1112) BP: 88/40 (09/15 0900) Pulse Rate: 84 (09/15 0932)  Labs: Recent Labs    04/20/19 0510 04/21/19 0453 04/22/19 0500  HGB 10.8* 10.0* 9.4*  HCT 33.6* 31.4* 29.0*  PLT 247 226 201  LABPROT 35.2* 40.6* 30.5*  INR 3.6* 4.3* 3.0*  CREATININE 7.49* 9.19* 5.77*  5.69*    Estimated Creatinine Clearance: 9.3 mL/min (A) (by C-G formula based on SCr of 5.69 mg/dL (H)).   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 She presented to the ED with shortness of breath.  Patient chronically anticoagulated with warfarin.    Home dose:  10mg  on Monday and 7.5mg  all other days. Drug Interactions: amiodarone  9/14 AM UPDATE:  INR is 3.0 today after Vitamin K 2.5 mg PO given yesterday.  Will re-start at  lower dose due to amiodarone interaction/CHF exacerbation (recommend 25-40% lower dose)  Goal of Therapy:  INR 2-3 Monitor platelets by anticoagulation protocol: Yes  Plan:  Warfarin 3 mg x 1 dose. Daily PT-INR Monitor for s/s of bleeding  Margot Ables, PharmD Clinical Pharmacist 04/22/2019 12:52 PM

## 2019-04-22 NOTE — Progress Notes (Signed)
Pharmacy Antibiotic Note  Sabrina Beck is a 78 y.o. female admitted on 04/08/2019 with HCAP and colitis. Pharmacy has been consulted to dose Zosyn therapy. This patient has ESRD and is dialyzed on M-W-F.  Plan: Continue Zosyn 3.375g IV q12h (4-hr infusion) Pharmacy will continue to monitor labs, cultures and patient improvement.   Height: 5\' 4"  (162.6 cm) Weight: 223 lb 12.3 oz (101.5 kg) IBW/kg (Calculated) : 54.7  Temp (24hrs), Avg:98.4 F (36.9 C), Min:98 F (36.7 C), Max:99.5 F (37.5 C)  Recent Labs  Lab 04/18/19 0456 04/19/19 0521 04/19/19 0522 04/19/19 1621 04/20/19 0510 04/21/19 0453 04/22/19 0500  WBC 10.6* 21.3*  --  23.2* 23.8* 19.9* 14.4*  CREATININE 7.00*  --  4.89*  --  7.49* 9.19* 5.77*  5.69*    Estimated Creatinine Clearance: 9.3 mL/min (A) (by C-G formula based on SCr of 5.69 mg/dL (H)).    No Known Allergies  Antimicrobials this admission: Cefepime 9/2>>9/6 Vancomycin 9/2 >>9/3-->re-started 9/12>>9/15 Azithromycin 9/2>> 9/4 Ceftriaxone 1gm IV x 1 in ED 9/2 Augmentin 9/6 >>9/10 Zosyn 9/15 >>   Microbiology results: 9/12 BC x2: ngtd 9/1 BCx: ng (final) 9/2 MRSA PCR: negative 9/6 sputum: neg (final)    Thank you for allowing pharmacy to participate in this patient's care.  Margot Ables, PharmD Clinical Pharmacist 04/22/2019 1:40 PM

## 2019-04-22 NOTE — Progress Notes (Addendum)
Palliative:  HPI: 78 y.o. 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 Had slow improvement with pneumonia but hospitalization complicated by subsequent issues with atrial fibrillation and hypotension complicated efficacy of dialysis. 9/12 she developed worsening leukocytosis and concern for ischemic colitis and possible continued pneumonia continued issues with hypotension. 9/15 BP is more stable and she is more alert and up in chair; WBC trending down.   I met again today with Sabrina Beck and her son, Sabrina Beck. I explained that treatment plans are not changed at this stage. Explained that we are limited for further work up as with her renal disease she cannot receive IV contrast and she is not a candidate for colonoscopy at this time. They understand. She is feeling better today so they are happy to continue with current therapies. She remains free of abd pain which is reassuring. Her only complaint is weakness and we discussed simple movements and exercises she can do in chair or bed and recommended movement during each commercial break. They are pleased with her progress today but understand this continues to be a long road for her.   All questions/concerns addressed. Emotional support provided.   Exam: More alert and awake today. Oriented. Sitting up in chair. Breathing regular, unlabored. Abd flat. Generalized weakness.   Plan: - Partial code: would consider short term intubation.  - Continue with aggressive treatment with hopes of slow improvement.  - Constipation resolved LBM 9/15.  - Trial of Nepro supplement as appetite is decreased.   25 min  Vinie Sill, NP Palliative Medicine Team Pager [phone] (Please see [email or web address] for schedule) Team Phone [phone]    Greater than 50%  of this time was spent counseling and coordinating  care related to the above assessment and plan

## 2019-04-22 NOTE — Progress Notes (Signed)
PROGRESS NOTE  Sabrina Beck GLO:[id number] DOB: Feb 26, 1941 DOA: 04/08/2019 PCP: Redmond School, MD   Brief History: 78 y.o.femalewith medical history significant ofend-stage renal disease, permanent atrial fibrillation on chronic anticoagulation with Coumadin, chronicsystolic anddiastolic CHF, hypertension, GERD, presents to the hospital with chief complaint of significant shortness of breathx 5 days.She reports that it felt like she just could not take a deep breath in. Patient deniedany chest pain, coughing, fever, dizziness, n/v/d.  The patient was started on IV vanco and cefepime and treated for PNA. Her abx were de-escalated.She overall improved, but she developed Afib with RVR with soft BPs.  Cardiology was consulted and pt was started on IV amio and esmolol.  Her esmolol was weaned off as she improved.  Unfortunately, her WBC increased up to 23K on 04/19/19 and she subsequently became hypotensive again.  Vanco and zosyn were restarted.  ED course Chemistry shows a glucose of 140, BUN of 45, creatinine of 9.98, and GFR of 3. Patient has a BNP of 344. Initial troponin is 20. Hematology reveals no leukocytosis, but a macrocytic anemia with a hemoglobin of 11. Chest x-ray was done that shows enlargement of cardiac silhouette. Right lower lobe consolidation consistent with pneumonia. Blood cultures drawn. Lactic acid 1.6. Troponin stable at 22. Second lactic acid 1.3.  On 04/19/19, the patient's WBC increased, but she remained afebrile.  Subsequently, pt became hypotensive again. Blood cultures x 2 sets were repeated and pt was restarted on zosyn and vanco empirically. Her BPs became soft again, and she remained on amiodarone and metoprolol dose was decreased.  GI was consulted for colitis.  CT chest showed bibasilar consolidation.  Femoral central line was discontinued and right IJ PICC line was placed.  Fortunately, repeat blood cultures remained neg. Vancomycin was  discontinued and zosyn continued.    Assessment/Plan: Acute hypoxic respiratory failure with hypoxia due tolobar pneumoniaas well as pulmonary edema -04/08/19--Patient was admitted to stepdown and placed on supplemental oxygen.  -weaned to RA, but new oxygen requirement again on 9/12 -Nephrologyfollow up appreciated -Continuepulmicort to 0.5 mg bid during hospitalization -pt endorsed some indiscretion with fluidsat home -1/16--Echo--EF 45%, mid inferior septal HK, PASP 21, mild TR/MR -finished9days abx during hospitalization -on 04/19/19--WBC went up with procalcitonin  Sepsis -due to HCAP and colitis -d/c vanco -continue zosyn -04/19/19--procalcitonin3.14>>>4.02 -04/21/19 BPs hypotensive -04/22/19 BP slowly improving with decreasing WBC  HCAP/Lobar Pneumonia -04/19/19 WBC went up -04/19/19--procalcitonin3.14>>>4.02 -04/19/19 blood cultures x 2 sets--neg to date -continue zosyn -04/20/19 CT chest--collapse, consolidation in bilateral LL, small bilateral pleural effusion, ULN mediastinal LNs  Colitis -suspect ischemic colitis -04/19/19--CT abd/pelvis--segmental thickening+adjacent percolonic hazy mesenteric stranding -consult GI--appreciated-->manage medically -continue Zosyn  Atrial fibrillation, permanent, with RVR -Due to fluid overload,pneumonia, hypoxia -she was placed onesmololinfusionby cardiology-->weaned off 9/11 -restartedmetoprolol--increase to 37.5 mg bid>>>decrease to 25 bid -Continue Coumadin per pharmacy -rate control difficult due to low BPsfor nearly one week-->rate now improving -Appreciate cardiology consultation -amiodarone drip started 04/17/19-->continue for now in setting of new infectious process and high risk of RVR -End point for amiodarone drip is improvement of sepsis, stabilization of BP and HR on metoprolol -do not need to transition to po amiodarone as pt has permanent afib  Coagulopathy -INR trending up even without  warfarin -due to sepsis -04/21/19 give small dose vitamin K--2.5 mg  Poor IV access -femoral CVL placed 04/17/19-->removed 04/20/19 -R-IJ PICC line placed 1/88/41  chronicsystolic and diastolicCHF -Given the decrease in EF, discontinueddiltiazem in Jan  2020 -08/22/18--Echo--EF 45%, mid inferior septal HK, PASP 21, mild TR/MR -continue metoprolol tartrate 25 mg bid -wt down from 106 to97.7Kg. -now appears euvolemic  ESRD -last HD 04/15/19--UF only -HD on 9/9and9/11 -appreciate nephrology -midodrine on HD to help maintain BP  Bronchospasm -Continue Pulmicort to 0.5 mg bid -d/c prednisone -Continueatrovent/Xopenexneb -improved  Hypercalcemia -Hectorol on hold -per nephrology  Hypertension -Blood pressure soft again -decrease metoprolol  Anemia of end-stage renal disease -Hemoglobin stable, no bleeding -continue ESA  GERD -Continue home PPI   Goals of Care -consult palliative medicine for goals of care discussion -no CPR, but yes to intubation      Disposition Plan: remain in SDU Family Communication:son updated at bedside 04/22/19  Consultants:renal, cardiology  Code Status: FULL   DVT Prophylaxis:coumadin   Procedures: Femoral CVL--9/10>>>>9/13 R-IJ PICC 9/13>>>  Antibiotics: vanc 9/2 Ceftriaxone 9/1 Cefepime 9/2>>>9/5 Amox/clav 9/6>>>9/10 vanco 9/12>>>9/15 Zosyn 9/12>>>  The patient is critically ill with multiple organ systems failure and requires high complexity decision making for assessment and support, frequent evaluation and titration of therapies, application of advanced monitoring technologies and extensive interpretation of multiple databases.  Critical care time - 35 mins.   Subjective:   Objective: Vitals:   04/22/19 0856 04/22/19 0900 04/22/19 0932 04/22/19 1112  BP:  (!) 88/40    Pulse:  79 84   Resp:  11    Temp:    98.5 F (36.9 C)  TempSrc:    Oral  SpO2: 92% 100%    Weight:       Height:        Intake/Output Summary (Last 24 hours) at 04/22/2019 1226 Last data filed at 04/22/2019 0934 Gross per 24 hour  Intake 718.74 ml  Output -1000 ml  Net 1718.74 ml   Weight change:  Exam:   General:  Pt is alert, follows commands appropriately, not in acute distress  HEENT: No icterus, No thrush, No neck mass, [place]/AT  Cardiovascular: IRRR, S1/S2, no rubs, no gallops  Respiratory: bibasilar rales  Abdomen: Soft/+BS, non tender, non distended, no guarding  Extremities: trace LE edema, No lymphangitis, No petechiae, No rashes, no synovitis   Data Reviewed: I have personally reviewed following labs and imaging studies Basic Metabolic Panel: Recent Labs  Lab 04/18/19 0456 04/19/19 0522 04/20/19 0510 04/21/19 0453 04/22/19 0500  NA 136 136 133* 133* 133*  K 4.4 4.6 4.5 4.5 3.1*  CL 97* 95* 92* 92* 90*  CO2 22 27 23 24 27   GLUCOSE 116* 124* 117* 100* 100*  BUN 57* 35* 63* 85* 39*  CREATININE 7.00* 4.89* 7.49* 9.19* 5.77*   5.69*  CALCIUM 9.6 10.0 9.7 9.4 9.3  MG 2.5*  --   --   --   --   PHOS 8.6* 6.5* 8.2* 9.6* 5.8*   Liver Function Tests: Recent Labs  Lab 04/18/19 0456 04/19/19 0522 04/20/19 0510 04/21/19 0453 04/22/19 0500  ALBUMIN 3.2* 3.4* 2.9* 2.5* 2.8*   No results for input(s): LIPASE, AMYLASE in the last 168 hours. No results for input(s): AMMONIA in the last 168 hours. Coagulation Profile: Recent Labs  Lab 04/18/19 0456 04/19/19 0521 04/20/19 0510 04/21/19 0453 04/22/19 0500  INR 2.8* 2.6* 3.6* 4.3* 3.0*   CBC: Recent Labs  Lab 04/19/19 0521 04/19/19 1621 04/20/19 0510 04/21/19 0453 04/22/19 0500  WBC 21.3* 23.2* 23.8* 19.9* 14.4*  HGB 11.4* 11.4* 10.8* 10.0* 9.4*  HCT 36.0 35.6* 33.6* 31.4* 29.0*  MCV 110.8* 110.2* 111.3* 109.0* 108.2*  PLT 318 294 247  226 201   Cardiac Enzymes: No results for input(s): CKTOTAL, CKMB, CKMBINDEX, TROPONINI in the last 168 hours. BNP: Invalid input(s): POCBNP CBG: No results for  input(s): GLUCAP in the last 168 hours. HbA1C: No results for input(s): HGBA1C in the last 72 hours. Urine analysis:    Component Value Date/Time   COLORURINE YELLOW 03/03/2012 1500   APPEARANCEUR CLEAR 03/03/2012 1500   LABSPEC 1.020 03/03/2012 1500   PHURINE 6.0 03/03/2012 1500   GLUCOSEU 100 (A) 03/03/2012 1500   HGBUR TRACE (A) 03/03/2012 1500   BILIRUBINUR NEGATIVE 03/03/2012 1500   KETONESUR NEGATIVE 03/03/2012 1500   PROTEINUR 100 (A) 03/03/2012 1500   UROBILINOGEN 0.2 03/03/2012 1500   NITRITE NEGATIVE 03/03/2012 1500   LEUKOCYTESUR NEGATIVE 03/03/2012 1500   Sepsis Labs: @LABRCNTIP (procalcitonin:4,lacticidven:4) ) Recent Results (from the past 240 hour(s))  Culture, sputum-assessment     Status: None   Collection Time: 04/13/19  9:30 AM   Specimen: Sputum  Result Value Ref Range Status   Specimen Description SPUTUM  Final   Special Requests NONE  Final   Sputum evaluation   Final    Sputum specimen not acceptable for testing.  Please recollect.   NOTIFIED L. HILTON,RN @1014   09/06/2020KAY  SPECIMEN TO BE RECOLLECTED Performed at [facility], [address], [place], [place] [address]    Report Status 04/13/2019 FINAL  Final  MRSA PCR Screening     Status: None   Collection Time: 04/14/19  2:38 PM   Specimen: Nasal Mucosa; Nasopharyngeal  Result Value Ref Range Status   MRSA by PCR NEGATIVE NEGATIVE Final    Comment:        The GeneXpert MRSA Assay (FDA approved for NASAL specimens only), is one component of a comprehensive MRSA colonization surveillance program. It is not intended to diagnose MRSA infection nor to guide or monitor treatment for MRSA infections. Performed at [facility], [address], [place], [place] [address]   Culture, blood (Routine X 2) w Reflex to ID Panel     Status: None (Preliminary result)   Collection Time: 04/19/19  4:21 PM   Specimen: BLOOD RIGHT HAND  Result Value Ref Range Status   Specimen Description   Final    BLOOD  RIGHT HAND BOTTLES DRAWN AEROBIC AND ANAEROBIC   Special Requests Blood Culture adequate volume  Final   Culture   Final    NO GROWTH 3 DAYS Performed at [facility], [address] [place], [place] [address]    Report Status PENDING  Incomplete  Culture, blood (Routine X 2) w Reflex to ID Panel     Status: None (Preliminary result)   Collection Time: 04/19/19  4:29 PM   Specimen: Leg; Blood  Result Value Ref Range Status   Specimen Description LEG BOTTLES DRAWN AEROBIC AND ANAEROBIC  Final   Special Requests Blood Culture adequate volume  Final   Culture   Final    NO GROWTH 3 DAYS Performed at [facility], [address] [place], [place] [address]    Report Status PENDING  Incomplete     Scheduled Meds:  budesonide (PULMICORT) nebulizer solution  0.5 mg Nebulization BID   Chlorhexidine Gluconate Cloth  6 each Topical Daily   Chlorhexidine Gluconate Cloth  6 each Topical Q0600   Chlorhexidine Gluconate Cloth  6 each Topical Q0600   Chlorhexidine Gluconate Cloth  6 each Topical Q0600   Chlorhexidine Gluconate Cloth  6 each Topical Q0600   epoetin (EPOGEN/PROCRIT) injection  2,800 Units Intravenous  Q M,W,F-HD   gabapentin  100 mg Oral BID   ipratropium  0.5 mg Nebulization TID   levalbuterol  1.25 mg Nebulization TID   mouth rinse  15 mL Mouth Rinse BID   metoprolol tartrate  25 mg Oral BID   [START ON 04/23/2019] midodrine  10 mg Oral Q M,W,F-HD   pantoprazole  40 mg Oral Daily   polyethylene glycol  17 g Oral Daily   senna  2 tablet Oral Daily   sevelamer carbonate  2,400 mg Oral TID WC   sodium chloride flush  10-40 mL Intracatheter Q12H   sorbitol  30 mL Oral Once   Warfarin - Pharmacist Dosing Inpatient   Does not apply q1800   Continuous Infusions:  sodium chloride     sodium chloride     amiodarone 30 mg/hr (04/22/19 0744)   piperacillin-tazobactam (ZOSYN)  IV 3.375 g (04/22/19 0743)   vancomycin 1,000 mg (04/21/19 1734)     Procedures/Studies: Ct Abdomen Pelvis Wo Contrast  Result Date: 04/19/2019 CLINICAL DATA:  Abdominal pain, gastroenteritis or colitis suspected EXAM: CT ABDOMEN AND PELVIS WITHOUT CONTRAST TECHNIQUE: Multidetector CT imaging of the abdomen and pelvis was performed following the standard protocol without IV contrast. COMPARISON:  None. FINDINGS: Lower chest: Multifocal consolidative opacities are present in the lung bases. More masslike consolidation is present in the left lower lobe. Diffuse airways thickening is noted in the bases. Dependent atelectatic changes noted posteriorly. Trace effusions are present as well. There is cardiomegaly with biatrial enlargement. Atherosclerotic calcification of the coronary arteries. Atherosclerotic calcifications are present upon the aortic leaflets and within the mitral valve annulus. Hepatobiliary: No focal liver abnormality is seen. Patient is post cholecystectomy. Slight prominence of the biliary tree likely related to reservoir effect. No calcified intraductal gallstones. Pancreas: Unremarkable. No pancreatic ductal dilatation or surrounding inflammatory changes. Spleen: Normal in size without focal abnormality. Adrenals/Urinary Tract: Normal adrenal glands. Severe atrophy of both kidneys with multiple vascular calcifications in the renal hila and few lobular subcentimeter renal lesions likely reflecting cysts. These are too small to fully characterize on CT imaging but statistically likely benign. Stomach/Bowel: Distal esophagus, stomach and duodenal sweep are unremarkable. No bowel wall thickening or dilatation. No evidence of obstruction. A normal appendix is visualized. There is segmental thickening and adjacent pericolonic hazy mesenteric stranding involving the transverse colon and proximal descending colon. There are numerous pancolonic diverticula however inflammation is not appear centered upon a focal culprit diverticulum. No resulting obstruction.  Vascular/Lymphatic: Severe atherosclerotic calcification. Evaluation of vascular patency is limited by the absence of intravenous contrast. No suspicious or enlarged lymph nodes in the included lymphatic chains. Reproductive: Uterus is surgically absent. No concerning adnexal lesions. Other: No abdominopelvic free fluid or free gas. No bowel containing hernias. Fat containing ventral/umbilical hernia. Anterior abdominal wall laxity. Small amount of soft tissue stranding/edema along the left lateral abdominal wall. Musculoskeletal: Multilevel degenerative changes are present in the imaged portions of the spine. IMPRESSION: 1. Segmental thickening and adjacent pericolonic hazy mesenteric stranding involving the transverse colon and proximal descending colon, consistent with colitis, which could be infectious, inflammatory or potentially vascular in etiology. Evaluation of the vasculature is limited in the absence of contrast media. 2. Multifocal areas of airspace opacity in both lower lobes. More masslike consolidation is present in the left lung base. Recommend reimaging following therapeutic intervention to ensure resolution. 3. Extensive pancolonic diverticulosis without evidence of acute diverticulitis. 4. Bilateral renal atrophy. Scattered cystic lesions in both kidneys, too small characterize  on CT imaging. 5. Small fat containing ventral hernia. 6. Aortic Atherosclerosis (ICD10-I70.0). Electronically Signed   By: Lovena Le M.D.   On: 04/19/2019 22:36   Dg Chest 2 View  Result Date: 04/10/2019 CLINICAL DATA:  Shortness of breath. EXAM: CHEST - 2 VIEW COMPARISON:  04/08/2019 and 08/18/2018 FINDINGS: Cardiac size is within normal limits. Aortic atherosclerosis. New pulmonary vascular congestion. New bilateral perihilar infiltrates. Persistent hazy infiltrate at the right lung base posteriorly. Minimal blunting of the posterior costophrenic angles. No acute bone abnormality. IMPRESSION: New bilateral  perihilar infiltrates. Persistent infiltrate at the right lung base. This could represent pulmonary edema or multifocal progressive pneumonia. Aortic Atherosclerosis (ICD10-I70.0). Electronically Signed   By: Lorriane Shire M.D.   On: 04/10/2019 11:04   Ct Head Wo Contrast  Result Date: 04/11/2019 CLINICAL DATA:  Transient ischemic attack.  Slurred speech. EXAM: CT HEAD WITHOUT CONTRAST TECHNIQUE: Contiguous axial images were obtained from the base of the skull through the vertex without intravenous contrast. COMPARISON:  CT scan of September 05, 2014. FINDINGS: Brain: No evidence of acute infarction, hemorrhage, hydrocephalus, extra-axial collection or mass lesion/mass effect. Vascular: No hyperdense vessel or unexpected calcification. Skull: Normal. Negative for fracture or focal lesion. Sinuses/Orbits: No acute finding. Other: None. IMPRESSION: No acute intracranial abnormality seen. Electronically Signed   By: Marijo Conception M.D.   On: 04/11/2019 12:08   Ct Chest Wo Contrast  Result Date: 04/20/2019 CLINICAL DATA:  Masslike opacity seen in the lower lobes on abdomen CT yesterday. EXAM: CT CHEST WITHOUT CONTRAST TECHNIQUE: Multidetector CT imaging of the chest was performed following the standard protocol without IV contrast. COMPARISON:  Abdomen CT 04/19/2019 FINDINGS: Cardiovascular: Heart is enlarged. Coronary artery calcification is evident. Atherosclerotic calcification is noted in the wall of the thoracic aorta. Right IJ central line tip is in the distal SVC. Mediastinum/Nodes: Upper normal to borderline enlarged precarinal and subcarinal lymph nodes are identified. Precarinal 10 mm node identified on 52/2 in there is 11 mm subcarinal node on 63/2. No evidence for gross hilar lymphadenopathy although assessment is limited by the lack of intravenous contrast on today's study. The esophagus has normal imaging features. There is no axillary lymphadenopathy. Lungs/Pleura: Collapse/consolidative changes  seen in both lower lobes tracking from the inferior hilar regions posteriorly and inferiorly into the lung bases. Probable atelectasis anterior right upper lobe. No overt pulmonary edema. Small bilateral pleural effusions noted. Upper Abdomen: Unremarkable. Musculoskeletal: No worrisome lytic or sclerotic osseous abnormality. IMPRESSION: Bilateral lower lobe collapse/consolidation compatible with pneumonia. Small bilateral pleural effusions. Upper normal to borderline enlarged central mediastinal lymph nodes, presumably reactive. Follow-up CT chest in 3 months could be used to ensure stability. Electronically Signed   By: Misty Stanley M.D.   On: 04/20/2019 17:30   Dg Chest Port 1 View  Result Date: 04/20/2019 CLINICAL DATA:  Central line placement EXAM: PORTABLE CHEST 1 VIEW COMPARISON:  04/15/2019 FINDINGS: Limited rotated portable exam to the left. Right IJ central line tip projects over the spine related to the rotation with the tip in the mid right atrium level. This can be retracted 3.5 cm toi the SVC RA junction. Once completed, consider repeat chest x-ray without rotation to confirm tip position. Stable cardiomegaly and vascular congestion. Lower lung volumes with increased bibasilar atelectasis/airspace disease worse in the left lower lobe obscuring the left hemidiaphragm. Small left effusion not excluded. No pneumothorax. Aorta atherosclerotic. IMPRESSION: Right IJ central line tip as detailed above. See above comment and recommendation. Cardiomegaly with vascular  congestion Lower lung volumes with increased basilar atelectasis/consolidation worse on the left. Electronically Signed   By: Jerilynn Mages.  Shick M.D.   On: 04/20/2019 10:58   Dg Chest Port 1 View  Result Date: 04/15/2019 CLINICAL DATA:  Shortness of breath. EXAM: PORTABLE CHEST 1 VIEW COMPARISON:  Radiographs of April 13, 2019. FINDINGS: Stable cardiomegaly. No pneumothorax or definite pleural effusion is noted. Stable bilateral lung opacities  are noted concerning for edema or inflammation. Bony thorax is unremarkable. IMPRESSION: Stable bilateral lung opacities as described above. Electronically Signed   By: Marijo Conception M.D.   On: 04/15/2019 07:31   Dg Chest Port 1 View  Result Date: 04/13/2019 CLINICAL DATA:  Shortness of breath and hypoxia in a patient with a history of end-stage renal disease. EXAM: PORTABLE CHEST 1 VIEW COMPARISON:  Single-view of the chest 04/11/2019. PA and lateral chest 04/10/2019. FINDINGS: Bilateral airspace disease has improved but there is persistent focal airspace opacity in the right mid and lower lung zones. There is cardiomegaly. No pneumothorax. Small bilateral pleural effusions. IMPRESSION: Persistent but improved bilateral airspace disease compatible with decreased pulmonary edema. More focal airspace opacity in the right midlung could be due to edema or pneumonia. Electronically Signed   By: Inge Rise M.D.   On: 04/13/2019 09:08   Dg Chest Port 1 View  Result Date: 04/11/2019 CLINICAL DATA:  Increasing shortness of breath following dialysis. EXAM: PORTABLE CHEST 1 VIEW COMPARISON:  04/10/2019. FINDINGS: Poor inspiration. Progressive enlargement of the cardiac silhouette with increased prominence of the pulmonary vasculature interstitial markings. Progressive bilateral airspace opacities, greater on the right. No definite pleural fluid seen. Unremarkable bones. IMPRESSION: 1. Progressive cardiomegaly and changes of congestive heart failure. 2. Progressive bilateral alveolar edema or pneumonia, greater on the right. Electronically Signed   By: Claudie Revering M.D.   On: 04/11/2019 19:54   Dg Chest Port 1 View  Result Date: 04/08/2019 CLINICAL DATA:  Difficulty breathing for past few days, dyspnea on exertion, denies fever, history atrial fibrillation, heart failure, end-stage renal disease on dialysis, systemic hypertension, uterine cancer EXAM: PORTABLE CHEST 1 VIEW COMPARISON:  Portable exam 1645 hours  compared to 08/18/2018 FINDINGS: Enlargement of cardiac silhouette. Mediastinal contours and pulmonary vascularity normal. Atherosclerotic calcification aorta. RIGHT lower lobe consolidation consistent with pneumonia. Remaining lungs clear. No pleural effusion or pneumothorax. Bones demineralized. IMPRESSION: Enlargement of cardiac silhouette. RIGHT lower lobe consolidation consistent with pneumonia. Electronically Signed   By: Lavonia Dana M.D.   On: 04/08/2019 17:18    Orson Eva, DO  [facility] Pager [phone]  If 7PM-7AM, please contact night-coverage [email or web address] Password [email or web address] 04/22/2019, 12:26 PM   LOS: 12 days

## 2019-04-22 NOTE — Progress Notes (Signed)
Subjective:  HD was tenuous yesterday, required much support from a BP standpoint- ended p positive fluid balance- for whatever reason BP seems better this AM--- noted palliative care discussion - partial code (no CPR, cardioversion or ACLS meds) but yes to bipap and possible short term intubation  Objective Vital signs in last 24 hours: Vitals:   04/22/19 0600 04/22/19 0650 04/22/19 0700 04/22/19 0800  BP:  (!) 100/55 (!) 101/55 122/63  Pulse: 82 84 83 94  Resp: 19 19 (!) 21 19  Temp:      TempSrc:      SpO2: 97% 95% 90% 91%  Weight:      Height:       Weight change:   Intake/Output Summary (Last 24 hours) at 04/22/2019 0854 Last data filed at 04/21/2019 1651 Gross per 24 hour  Intake 708.74 ml  Output -1000 ml  Net 1708.74 ml    [facility]: [id number] Bath 2K/2.5CaTime 3.5 hrsHeparin none. AccessLAVFBFR 400DFR 600  Hectoral 9.62mcg IV/HD Epogen 2,800Units IV/HD     Assessment/ Plan: Pt is a 78 y.o. yo female with ESRD who was admitted on 04/08/2019 with initially SOB-- able to pull volume with HD but then went into Afib and now with elevated WBC - concern for ischemic colitis Assessment/Plan: 1. ID-- initially on Abx for PNA- improved and abx were stopped.  Now with elevated WBC and concern for colitis, abx resumed- per primary team- WBC is trending down 2. ESRD - normally MWF via AVF-  Initially successful with needed volume removal down to as low as 93.5 kg from 110.  Today 101.5 kg- not obviously volume overloaded, good O2 sat now on RA.  Will UF as able tomorrow   3. Anemia- hgb staying above 10 on maintenance epo- went down to 9.4 today , follow  4. HTN/volume- as above- volume down overall this hosp-  No volume removal with HD yest - will increase midodrine to 10 mg pre HD.  Afib is complicating  5. [place]-  Previous hypercalcemia- vit D on hold, has improved.  Phos is high on renvela but improving.  K a little low today  , will give some repletion and do HD on 3 K bath tomorrow  6. Dispo- agree with goals of care discussion - partial code but not ready to "give up"  Will likely need placement if wasn't placed previously   [place]: Basic Metabolic Panel: Recent Labs  Lab 04/20/19 0510 04/21/19 0453 04/22/19 0500  NA 133* 133* 133*  K 4.5 4.5 3.1*  CL 92* 92* 90*  CO2 23 24 27   GLUCOSE 117* 100* 100*  BUN 63* 85* 39*  CREATININE 7.49* 9.19* 5.77*  5.69*  CALCIUM 9.7 9.4 9.3  PHOS 8.2* 9.6* 5.8*   Liver Function Tests: Recent Labs  Lab 04/20/19 0510 04/21/19 0453 04/22/19 0500  ALBUMIN 2.9* 2.5* 2.8*   No results for input(s): LIPASE, AMYLASE in the last 168 hours. No results for input(s): AMMONIA in the last 168 hours. CBC: Recent Labs  Lab 04/19/19 0521 04/19/19 1621 04/20/19 0510 04/21/19 0453 04/22/19 0500  WBC 21.3* 23.2* 23.8* 19.9* 14.4*  HGB 11.4* 11.4* 10.8* 10.0* 9.4*  HCT 36.0 35.6* 33.6* 31.4* 29.0*  MCV 110.8* 110.2* 111.3* 109.0* 108.2*  PLT 318 294 247 226 201   Cardiac Enzymes: No results for input(s): CKTOTAL, CKMB, CKMBINDEX, TROPONINI in the last 168 hours. CBG: No results for input(s): GLUCAP in the last 168  hours.  Iron Studies: No results for input(s): IRON, TIBC, TRANSFERRIN, FERRITIN in the last 72 hours. Studies/Results: Ct Chest Wo Contrast  Result Date: 04/20/2019 CLINICAL DATA:  Masslike opacity seen in the lower lobes on abdomen CT yesterday. EXAM: CT CHEST WITHOUT CONTRAST TECHNIQUE: Multidetector CT imaging of the chest was performed following the standard protocol without IV contrast. COMPARISON:  Abdomen CT 04/19/2019 FINDINGS: Cardiovascular: Heart is enlarged. Coronary artery calcification is evident. Atherosclerotic calcification is noted in the wall of the thoracic aorta. Right IJ central line tip is in the distal SVC. Mediastinum/Nodes: Upper normal to borderline enlarged precarinal and subcarinal lymph nodes are  identified. Precarinal 10 mm node identified on 52/2 in there is 11 mm subcarinal node on 63/2. No evidence for gross hilar lymphadenopathy although assessment is limited by the lack of intravenous contrast on today's study. The esophagus has normal imaging features. There is no axillary lymphadenopathy. Lungs/Pleura: Collapse/consolidative changes seen in both lower lobes tracking from the inferior hilar regions posteriorly and inferiorly into the lung bases. Probable atelectasis anterior right upper lobe. No overt pulmonary edema. Small bilateral pleural effusions noted. Upper Abdomen: Unremarkable. Musculoskeletal: No worrisome lytic or sclerotic osseous abnormality. IMPRESSION: Bilateral lower lobe collapse/consolidation compatible with pneumonia. Small bilateral pleural effusions. Upper normal to borderline enlarged central mediastinal lymph nodes, presumably reactive. Follow-up CT chest in 3 months could be used to ensure stability. Electronically Signed   By: Misty Stanley M.D.   On: 04/20/2019 17:30   Dg Chest Port 1 View  Result Date: 04/20/2019 CLINICAL DATA:  Central line placement EXAM: PORTABLE CHEST 1 VIEW COMPARISON:  04/15/2019 FINDINGS: Limited rotated portable exam to the left. Right IJ central line tip projects over the spine related to the rotation with the tip in the mid right atrium level. This can be retracted 3.5 cm toi the SVC RA junction. Once completed, consider repeat chest x-ray without rotation to confirm tip position. Stable cardiomegaly and vascular congestion. Lower lung volumes with increased bibasilar atelectasis/airspace disease worse in the left lower lobe obscuring the left hemidiaphragm. Small left effusion not excluded. No pneumothorax. Aorta atherosclerotic. IMPRESSION: Right IJ central line tip as detailed above. See above comment and recommendation. Cardiomegaly with vascular congestion Lower lung volumes with increased basilar atelectasis/consolidation worse on the  left. Electronically Signed   By: Jerilynn Mages.  Shick M.D.   On: 04/20/2019 10:58   Medications: Infusions: . sodium chloride    . sodium chloride    . amiodarone 30 mg/hr (04/22/19 0744)  . piperacillin-tazobactam (ZOSYN)  IV 3.375 g (04/22/19 0743)  . vancomycin 1,000 mg (04/21/19 1734)    Scheduled Medications: . budesonide (PULMICORT) nebulizer solution  0.5 mg Nebulization BID  . Chlorhexidine Gluconate Cloth  6 each Topical Daily  . Chlorhexidine Gluconate Cloth  6 each Topical Q0600  . Chlorhexidine Gluconate Cloth  6 each Topical Q0600  . Chlorhexidine Gluconate Cloth  6 each Topical Q0600  . epoetin (EPOGEN/PROCRIT) injection  2,800 Units Intravenous Q M,W,F-HD  . gabapentin  100 mg Oral BID  . ipratropium  0.5 mg Nebulization TID  . levalbuterol  1.25 mg Nebulization TID  . mouth rinse  15 mL Mouth Rinse BID  . metoprolol tartrate  25 mg Oral BID  . [START ON 04/23/2019] midodrine  10 mg Oral Q M,W,F-HD  . pantoprazole  40 mg Oral Daily  . polyethylene glycol  17 g Oral Daily  . senna  2 tablet Oral Daily  . sevelamer carbonate  2,400 mg Oral  TID WC  . sodium chloride flush  10-40 mL Intracatheter Q12H  . sorbitol  30 mL Oral Once  . Warfarin - Pharmacist Dosing Inpatient   Does not apply q1800    have reviewed scheduled and prn medications.  Physical Exam: General: pale, slightly slow mentally NAD- seems a little brighter today- up in bedside chair  Heart: irreg Lungs: dec BS at bases Abdomen: soft, non tender Extremities: trace pitting edema Dialysis Access: left AVF- patent    04/22/2019,8:54 AM  LOS: 12 days

## 2019-04-23 LAB — CBC WITH DIFFERENTIAL/PLATELET
Abs Immature Granulocytes: 0.11 10*3/uL — ABNORMAL HIGH (ref 0.00–0.07)
Basophils Absolute: 0.1 10*3/uL (ref 0.0–0.1)
Basophils Relative: 1 %
Eosinophils Absolute: 0.5 10*3/uL (ref 0.0–0.5)
Eosinophils Relative: 4 %
HCT: 30.4 % — ABNORMAL LOW (ref 36.0–46.0)
Hemoglobin: 9.7 g/dL — ABNORMAL LOW (ref 12.0–15.0)
Immature Granulocytes: 1 %
Lymphocytes Relative: 23 %
Lymphs Abs: 2.6 10*3/uL (ref 0.7–4.0)
MCH: 34.5 pg — ABNORMAL HIGH (ref 26.0–34.0)
MCHC: 31.9 g/dL (ref 30.0–36.0)
MCV: 108.2 fL — ABNORMAL HIGH (ref 80.0–100.0)
Monocytes Absolute: 0.6 10*3/uL (ref 0.1–1.0)
Monocytes Relative: 6 %
Neutro Abs: 7.7 10*3/uL (ref 1.7–7.7)
Neutrophils Relative %: 65 %
Platelets: 235 10*3/uL (ref 150–400)
RBC: 2.81 MIL/uL — ABNORMAL LOW (ref 3.87–5.11)
RDW: 13.5 % (ref 11.5–15.5)
WBC: 11.7 10*3/uL — ABNORMAL HIGH (ref 4.0–10.5)
nRBC: 0 % (ref 0.0–0.2)

## 2019-04-23 LAB — RENAL FUNCTION PANEL
Albumin: 2.8 g/dL — ABNORMAL LOW (ref 3.5–5.0)
Anion gap: 18 — ABNORMAL HIGH (ref 5–15)
BUN: 52 mg/dL — ABNORMAL HIGH (ref 8–23)
CO2: 25 mmol/L (ref 22–32)
Calcium: 9.9 mg/dL (ref 8.9–10.3)
Chloride: 92 mmol/L — ABNORMAL LOW (ref 98–111)
Creatinine, Ser: 7.23 mg/dL — ABNORMAL HIGH (ref 0.44–1.00)
GFR calc Af Amer: 6 mL/min — ABNORMAL LOW (ref >60)
GFR calc non Af Amer: 5 mL/min — ABNORMAL LOW (ref >60)
Glucose, Bld: 109 mg/dL — ABNORMAL HIGH (ref 70–99)
Phosphorus: 5.2 mg/dL — ABNORMAL HIGH (ref 2.5–4.6)
Potassium: 3.2 mmol/L — ABNORMAL LOW (ref 3.5–5.1)
Sodium: 135 mmol/L (ref 135–145)

## 2019-04-23 LAB — PROTIME-INR
INR: 1.4 — ABNORMAL HIGH (ref 0.8–1.2)
Prothrombin Time: 16.9 seconds — ABNORMAL HIGH (ref 11.4–15.2)

## 2019-04-23 LAB — MAGNESIUM: Magnesium: 2.2 mg/dL (ref 1.7–2.4)

## 2019-04-23 MED ORDER — WARFARIN SODIUM 5 MG PO TABS
5.0000 mg | ORAL_TABLET | Freq: Once | ORAL | Status: AC
  Administered 2019-04-23: 5 mg via ORAL
  Filled 2019-04-23: qty 1

## 2019-04-23 NOTE — Progress Notes (Signed)
PROGRESS NOTE  Sabrina Beck HYQ:[id number] DOB: 1940/11/14 DOA: 04/08/2019 PCP: Redmond School, MD   Brief History: 78 y.o.femalewith medical history significant ofend-stage renal disease, permanent atrial fibrillation on chronic anticoagulation with Coumadin, chronicsystolic anddiastolic CHF, hypertension, GERD, presents to the hospital with chief complaint of significant shortness of breathx 5 days.She reports that it felt like she just could not take a deep breath in. Patient deniedany chest pain, coughing, fever, dizziness, n/v/d.  The patient was started on IV vanco and cefepime and treated for PNA. Her abx were de-escalated.She overall improved, but she developed Afib with RVR with soft BPs.  Cardiology was consulted and pt was started on IV amio and esmolol.  Her esmolol was weaned off as she improved.  Unfortunately, her WBC increased up to 23K on 04/19/19 and she subsequently became hypotensive again.  Vanco and zosyn were restarted.  ED course Chemistry shows a glucose of 140, BUN of 45, creatinine of 9.98, and GFR of 3. Patient has a BNP of 344. Initial troponin is 20. Hematology reveals no leukocytosis, but a macrocytic anemia with a hemoglobin of 11. Chest x-ray was done that shows enlargement of cardiac silhouette. Right lower lobe consolidation consistent with pneumonia. Blood cultures drawn. Lactic acid 1.6. Troponin stable at 22. Second lactic acid 1.3.  On 04/19/19, the patient's WBC increased, but she remained afebrile.  Subsequently, pt became hypotensive again. Blood cultures x 2 sets were repeated and pt was restarted on zosyn and vanco empirically. Her BPs became soft again, and she remained on amiodarone and metoprolol dose was decreased.  GI was consulted for colitis.  CT chest showed bibasilar consolidation.  Femoral central line was discontinued and right IJ PICC line was placed.  Fortunately, repeat blood cultures remained neg. Vancomycin was  discontinued and zosyn continued.    Assessment/Plan: Acute hypoxic respiratory failure with hypoxia due tolobar pneumoniaas well as pulmonary edema -04/08/19--Patient was admitted to stepdown and placed on supplemental oxygen.  -weaned to RA, but new oxygen requirement again on 9/12 -Nephrologyfollow up appreciated -Continuepulmicort to 0.5 mg bid during hospitalization -pt endorsed some indiscretion with fluidsat home -1/16--Echo--EF 45%, mid inferior septal HK, PASP 21, mild TR/MR -on 04/19/19--WBC started trending back up, restarted antibiotics.   Sepsis - resolved now -due to HCAP and colitis -d/c vanco -continue zosyn -04/19/19--procalcitonin3.14>>>4.02 -04/21/19 BPs hypotensive -04/22/19 BP slowly improving with decreasing WBC  HCAP/Lobar Pneumonia -04/19/19 WBC went up -04/19/19--procalcitonin3.14>>>4.02 -04/19/19 blood cultures x 2 sets--neg to date -continue zosyn -04/20/19 CT chest--collapse, consolidation in bilateral LL, small bilateral pleural effusion, ULN mediastinal LNs - repeat CXR in AM 9/17  Colitis -suspected ischemic colitis per GI  -04/19/19--CT abd/pelvis--segmental thickening+adjacent percolonic hazy mesenteric stranding -consult GI--appreciated-->manage medically -continue Zosyn  Atrial fibrillation, permanent, with RVR -Due to fluid overload,pneumonia, hypoxia -she was placed onesmololinfusionby cardiology-->weaned off 9/11 -restartedmetoprolol--increase to 37.5 mg bid>>>decrease to 25 bid -Continue Coumadin per pharmacy -rate control difficult due to low BPsfor nearly one week-->rate now improving -Appreciate cardiology consultation -amiodarone drip started 04/17/19-->continue for now in setting of new infectious process and high risk of RVR -DC amiodarone infusion.   Coagulopathy -INR trending up even without warfarin -due to sepsis -04/21/19 give small dose vitamin K--2.5 mg  Poor IV access -femoral CVL placed 04/17/19-->removed  04/20/19 -R-IJ PICC line placed 9/52/84  chronicsystolic and diastolicCHF -Given the decrease in EF, discontinueddiltiazem in Jan 2020 -08/22/18--Echo--EF 45%, mid inferior septal HK, PASP 21, mild TR/MR -continue metoprolol tartrate  25 mg bid -wt down from 106 to97.7Kg. -now appears euvolemic  ESRD -last HD 04/15/19--UF only -HD on 9/9and9/11 -appreciate nephrology -midodrine on HD to help maintain BP  Bronchospasm -Continue Pulmicort to 0.5 mg bid -prednisone was discontinued -Continueatrovent/Xopenexneb -improved  Hypercalcemia -Hectorol on hold -per nephrology  Hypertension -Blood pressure soft again -decrease metoprolol  Anemia of end-stage renal disease -Hemoglobin stable, no bleeding -continue ESA  GERD -Continue home PPI   Goals of Care -consult palliative medicine for goals of care discussion -no CPR, but yes to intubation  Disposition Plan: remain in SDU Family Communication:son updated at bedside 04/22/19  Consultants:renal, cardiology  Code Status: Partial   DVT Prophylaxis:warfarin  Procedures: Femoral CVL--9/10>>>>9/13 R-IJ PICC 9/13>>>  Antibiotics: vanc 9/2 Ceftriaxone 9/1 Cefepime 9/2>>>9/5 Amox/clav 9/6>>>9/10 vanco 9/12>>>9/15 Zosyn 9/12>>>  The patient is critically ill with multiple organ systems failure and requires high complexity decision making for assessment and support, frequent evaluation and titration of therapies, application of advanced monitoring technologies and extensive interpretation of multiple databases.   Subjective: Pt reports that she is feeling better and sitting up in chair today. Tolerating diet, no specific complaints.    Objective: Vitals:   04/23/19 1345 04/23/19 1400 04/23/19 1415 04/23/19 1430  BP: 107/64 109/63 112/73 105/71  Pulse: 82 85 89 93  Resp: 18 16 16 14   Temp:      TempSrc:      SpO2:  97%    Weight:      Height:        Intake/Output Summary (Last 24  hours) at 04/23/2019 1435 Last data filed at 04/23/2019 1430 Gross per 24 hour  Intake 1306.2 ml  Output 1200 ml  Net 106.2 ml   Weight change: -0.8 kg Exam:   General:  Pt is alert, follows commands appropriately, not in acute distress, oriented x 3.   HEENT: No icterus, No thrush, No neck mass, [place]/AT  Cardiovascular: irregular, normal S1/S2, no rubs, no gallops  Respiratory: BBS with fine basilar crackles.   Abdomen: Soft/+BS, non tender, non distended, no guarding  Extremities: no cyanosis or clubbing.   Neurological: nonfocal exam.   Data Reviewed: I have personally reviewed following labs and imaging studies Basic Metabolic Panel: Recent Labs  Lab 04/18/19 0456 04/19/19 0522 04/20/19 0510 04/21/19 0453 04/22/19 0500 04/23/19 0514 04/23/19 0515  NA 136 136 133* 133* 133* 135  --   K 4.4 4.6 4.5 4.5 3.1* 3.2*  --   CL 97* 95* 92* 92* 90* 92*  --   CO2 22 27 23 24 27 25   --   GLUCOSE 116* 124* 117* 100* 100* 109*  --   BUN 57* 35* 63* 85* 39* 52*  --   CREATININE 7.00* 4.89* 7.49* 9.19* 5.77*   5.69* 7.23*  --   CALCIUM 9.6 10.0 9.7 9.4 9.3 9.9  --   MG 2.5*  --   --   --   --   --  2.2  PHOS 8.6* 6.5* 8.2* 9.6* 5.8* 5.2*  --    Liver Function Tests: Recent Labs  Lab 04/19/19 0522 04/20/19 0510 04/21/19 0453 04/22/19 0500 04/23/19 0514  ALBUMIN 3.4* 2.9* 2.5* 2.8* 2.8*   No results for input(s): LIPASE, AMYLASE in the last 168 hours. No results for input(s): AMMONIA in the last 168 hours. Coagulation Profile: Recent Labs  Lab 04/19/19 0521 04/20/19 0510 04/21/19 0453 04/22/19 0500 04/23/19 0514  INR 2.6* 3.6* 4.3* 3.0* 1.4*   CBC: Recent Labs  Lab 04/19/19  1621 04/20/19 0510 04/21/19 0453 04/22/19 0500 04/23/19 0515  WBC 23.2* 23.8* 19.9* 14.4* 11.7*  NEUTROABS  --   --   --   --  7.7  HGB 11.4* 10.8* 10.0* 9.4* 9.7*  HCT 35.6* 33.6* 31.4* 29.0* 30.4*  MCV 110.2* 111.3* 109.0* 108.2* 108.2*  PLT 294 247 226 201 235   Cardiac  Enzymes: No results for input(s): CKTOTAL, CKMB, CKMBINDEX, TROPONINI in the last 168 hours. BNP: Invalid input(s): POCBNP CBG: No results for input(s): GLUCAP in the last 168 hours. HbA1C: No results for input(s): HGBA1C in the last 72 hours. Urine analysis:    Component Value Date/Time   COLORURINE YELLOW 03/03/2012 1500   APPEARANCEUR CLEAR 03/03/2012 1500   LABSPEC 1.020 03/03/2012 1500   PHURINE 6.0 03/03/2012 1500   GLUCOSEU 100 (A) 03/03/2012 1500   HGBUR TRACE (A) 03/03/2012 1500   BILIRUBINUR NEGATIVE 03/03/2012 1500   KETONESUR NEGATIVE 03/03/2012 1500   PROTEINUR 100 (A) 03/03/2012 1500   UROBILINOGEN 0.2 03/03/2012 1500   NITRITE NEGATIVE 03/03/2012 1500   LEUKOCYTESUR NEGATIVE 03/03/2012 1500    Recent Results (from the past 240 hour(s))  MRSA PCR Screening     Status: None   Collection Time: 04/14/19  2:38 PM   Specimen: Nasal Mucosa; Nasopharyngeal  Result Value Ref Range Status   MRSA by PCR NEGATIVE NEGATIVE Final    Comment:        The GeneXpert MRSA Assay (FDA approved for NASAL specimens only), is one component of a comprehensive MRSA colonization surveillance program. It is not intended to diagnose MRSA infection nor to guide or monitor treatment for MRSA infections. Performed at [facility], [address] [place], [place] [address]   Culture, blood (Routine X 2) w Reflex to ID Panel     Status: None (Preliminary result)   Collection Time: 04/19/19  4:21 PM   Specimen: BLOOD RIGHT HAND  Result Value Ref Range Status   Specimen Description   Final    BLOOD RIGHT HAND BOTTLES DRAWN AEROBIC AND ANAEROBIC   Special Requests Blood Culture adequate volume  Final   Culture   Final    NO GROWTH 4 DAYS Performed at [facility], [address], [place], [place] [address]    Report Status PENDING  Incomplete  Culture, blood (Routine X 2) w Reflex to ID Panel     Status: None (Preliminary result)   Collection Time: 04/19/19  4:29 PM   Specimen: Leg;  Blood  Result Value Ref Range Status   Specimen Description LEG BOTTLES DRAWN AEROBIC AND ANAEROBIC  Final   Special Requests Blood Culture adequate volume  Final   Culture   Final    NO GROWTH 4 DAYS Performed at [facility], [address] [place], [place] [address]    Report Status PENDING  Incomplete     Scheduled Meds:  budesonide (PULMICORT) nebulizer solution  0.5 mg Nebulization BID   Chlorhexidine Gluconate Cloth  6 each Topical Daily   epoetin (EPOGEN/PROCRIT) injection  2,800 Units Intravenous Q M,W,F-HD   feeding supplement (NEPRO CARB STEADY)  237 mL Oral Q1400   gabapentin  100 mg Oral BID   ipratropium  0.5 mg Nebulization TID   levalbuterol  1.25 mg Nebulization TID   mouth rinse  15 mL Mouth Rinse BID   metoprolol tartrate  25 mg Oral BID   midodrine  10 mg Oral Q M,W,F-HD   pantoprazole  40 mg Oral Daily   polyethylene glycol  17 g Oral Daily   senna  2 tablet Oral Daily   sevelamer carbonate  2,400 mg Oral TID WC   sodium chloride flush  10-40 mL Intracatheter Q12H   warfarin  5 mg Oral Once   Warfarin - Pharmacist Dosing Inpatient   Does not apply q1800   Continuous Infusions:  sodium chloride     sodium chloride     piperacillin-tazobactam (ZOSYN)  IV 3.375 g (04/23/19 0817)    Procedures/Studies: Ct Abdomen Pelvis Wo Contrast  Result Date: 04/19/2019 CLINICAL DATA:  Abdominal pain, gastroenteritis or colitis suspected EXAM: CT ABDOMEN AND PELVIS WITHOUT CONTRAST TECHNIQUE: Multidetector CT imaging of the abdomen and pelvis was performed following the standard protocol without IV contrast. COMPARISON:  None. FINDINGS: Lower chest: Multifocal consolidative opacities are present in the lung bases. More masslike consolidation is present in the left lower lobe. Diffuse airways thickening is noted in the bases. Dependent atelectatic changes noted posteriorly. Trace effusions are present as well. There is cardiomegaly with biatrial  enlargement. Atherosclerotic calcification of the coronary arteries. Atherosclerotic calcifications are present upon the aortic leaflets and within the mitral valve annulus. Hepatobiliary: No focal liver abnormality is seen. Patient is post cholecystectomy. Slight prominence of the biliary tree likely related to reservoir effect. No calcified intraductal gallstones. Pancreas: Unremarkable. No pancreatic ductal dilatation or surrounding inflammatory changes. Spleen: Normal in size without focal abnormality. Adrenals/Urinary Tract: Normal adrenal glands. Severe atrophy of both kidneys with multiple vascular calcifications in the renal hila and few lobular subcentimeter renal lesions likely reflecting cysts. These are too small to fully characterize on CT imaging but statistically likely benign. Stomach/Bowel: Distal esophagus, stomach and duodenal sweep are unremarkable. No bowel wall thickening or dilatation. No evidence of obstruction. A normal appendix is visualized. There is segmental thickening and adjacent pericolonic hazy mesenteric stranding involving the transverse colon and proximal descending colon. There are numerous pancolonic diverticula however inflammation is not appear centered upon a focal culprit diverticulum. No resulting obstruction. Vascular/Lymphatic: Severe atherosclerotic calcification. Evaluation of vascular patency is limited by the absence of intravenous contrast. No suspicious or enlarged lymph nodes in the included lymphatic chains. Reproductive: Uterus is surgically absent. No concerning adnexal lesions. Other: No abdominopelvic free fluid or free gas. No bowel containing hernias. Fat containing ventral/umbilical hernia. Anterior abdominal wall laxity. Small amount of soft tissue stranding/edema along the left lateral abdominal wall. Musculoskeletal: Multilevel degenerative changes are present in the imaged portions of the spine. IMPRESSION: 1. Segmental thickening and adjacent  pericolonic hazy mesenteric stranding involving the transverse colon and proximal descending colon, consistent with colitis, which could be infectious, inflammatory or potentially vascular in etiology. Evaluation of the vasculature is limited in the absence of contrast media. 2. Multifocal areas of airspace opacity in both lower lobes. More masslike consolidation is present in the left lung base. Recommend reimaging following therapeutic intervention to ensure resolution. 3. Extensive pancolonic diverticulosis without evidence of acute diverticulitis. 4. Bilateral renal atrophy. Scattered cystic lesions in both kidneys, too small characterize on CT imaging. 5. Small fat containing ventral hernia. 6. Aortic Atherosclerosis (ICD10-I70.0). Electronically Signed   By: Lovena Le M.D.   On: 04/19/2019 22:36   Dg Chest 2 View  Result Date: 04/10/2019 CLINICAL DATA:  Shortness of breath. EXAM: CHEST - 2 VIEW COMPARISON:  04/08/2019 and 08/18/2018 FINDINGS: Cardiac size is within normal limits. Aortic atherosclerosis. New pulmonary vascular congestion. New bilateral perihilar infiltrates. Persistent hazy infiltrate at the right lung base posteriorly. Minimal blunting of the posterior  costophrenic angles. No acute bone abnormality. IMPRESSION: New bilateral perihilar infiltrates. Persistent infiltrate at the right lung base. This could represent pulmonary edema or multifocal progressive pneumonia. Aortic Atherosclerosis (ICD10-I70.0). Electronically Signed   By: Lorriane Shire M.D.   On: 04/10/2019 11:04   Ct Head Wo Contrast  Result Date: 04/11/2019 CLINICAL DATA:  Transient ischemic attack.  Slurred speech. EXAM: CT HEAD WITHOUT CONTRAST TECHNIQUE: Contiguous axial images were obtained from the base of the skull through the vertex without intravenous contrast. COMPARISON:  CT scan of September 05, 2014. FINDINGS: Brain: No evidence of acute infarction, hemorrhage, hydrocephalus, extra-axial collection or mass  lesion/mass effect. Vascular: No hyperdense vessel or unexpected calcification. Skull: Normal. Negative for fracture or focal lesion. Sinuses/Orbits: No acute finding. Other: None. IMPRESSION: No acute intracranial abnormality seen. Electronically Signed   By: Marijo Conception M.D.   On: 04/11/2019 12:08   Ct Chest Wo Contrast  Result Date: 04/20/2019 CLINICAL DATA:  Masslike opacity seen in the lower lobes on abdomen CT yesterday. EXAM: CT CHEST WITHOUT CONTRAST TECHNIQUE: Multidetector CT imaging of the chest was performed following the standard protocol without IV contrast. COMPARISON:  Abdomen CT 04/19/2019 FINDINGS: Cardiovascular: Heart is enlarged. Coronary artery calcification is evident. Atherosclerotic calcification is noted in the wall of the thoracic aorta. Right IJ central line tip is in the distal SVC. Mediastinum/Nodes: Upper normal to borderline enlarged precarinal and subcarinal lymph nodes are identified. Precarinal 10 mm node identified on 52/2 in there is 11 mm subcarinal node on 63/2. No evidence for gross hilar lymphadenopathy although assessment is limited by the lack of intravenous contrast on today's study. The esophagus has normal imaging features. There is no axillary lymphadenopathy. Lungs/Pleura: Collapse/consolidative changes seen in both lower lobes tracking from the inferior hilar regions posteriorly and inferiorly into the lung bases. Probable atelectasis anterior right upper lobe. No overt pulmonary edema. Small bilateral pleural effusions noted. Upper Abdomen: Unremarkable. Musculoskeletal: No worrisome lytic or sclerotic osseous abnormality. IMPRESSION: Bilateral lower lobe collapse/consolidation compatible with pneumonia. Small bilateral pleural effusions. Upper normal to borderline enlarged central mediastinal lymph nodes, presumably reactive. Follow-up CT chest in 3 months could be used to ensure stability. Electronically Signed   By: Misty Stanley M.D.   On: 04/20/2019  17:30   Dg Chest Port 1 View  Result Date: 04/20/2019 CLINICAL DATA:  Central line placement EXAM: PORTABLE CHEST 1 VIEW COMPARISON:  04/15/2019 FINDINGS: Limited rotated portable exam to the left. Right IJ central line tip projects over the spine related to the rotation with the tip in the mid right atrium level. This can be retracted 3.5 cm toi the SVC RA junction. Once completed, consider repeat chest x-ray without rotation to confirm tip position. Stable cardiomegaly and vascular congestion. Lower lung volumes with increased bibasilar atelectasis/airspace disease worse in the left lower lobe obscuring the left hemidiaphragm. Small left effusion not excluded. No pneumothorax. Aorta atherosclerotic. IMPRESSION: Right IJ central line tip as detailed above. See above comment and recommendation. Cardiomegaly with vascular congestion Lower lung volumes with increased basilar atelectasis/consolidation worse on the left. Electronically Signed   By: Jerilynn Mages.  Shick M.D.   On: 04/20/2019 10:58   Dg Chest Port 1 View  Result Date: 04/15/2019 CLINICAL DATA:  Shortness of breath. EXAM: PORTABLE CHEST 1 VIEW COMPARISON:  Radiographs of April 13, 2019. FINDINGS: Stable cardiomegaly. No pneumothorax or definite pleural effusion is noted. Stable bilateral lung opacities are noted concerning for edema or inflammation. Bony thorax is unremarkable. IMPRESSION: Stable bilateral  lung opacities as described above. Electronically Signed   By: Marijo Conception M.D.   On: 04/15/2019 07:31   Dg Chest Port 1 View  Result Date: 04/13/2019 CLINICAL DATA:  Shortness of breath and hypoxia in a patient with a history of end-stage renal disease. EXAM: PORTABLE CHEST 1 VIEW COMPARISON:  Single-view of the chest 04/11/2019. PA and lateral chest 04/10/2019. FINDINGS: Bilateral airspace disease has improved but there is persistent focal airspace opacity in the right mid and lower lung zones. There is cardiomegaly. No pneumothorax. Small  bilateral pleural effusions. IMPRESSION: Persistent but improved bilateral airspace disease compatible with decreased pulmonary edema. More focal airspace opacity in the right midlung could be due to edema or pneumonia. Electronically Signed   By: Inge Rise M.D.   On: 04/13/2019 09:08   Dg Chest Port 1 View  Result Date: 04/11/2019 CLINICAL DATA:  Increasing shortness of breath following dialysis. EXAM: PORTABLE CHEST 1 VIEW COMPARISON:  04/10/2019. FINDINGS: Poor inspiration. Progressive enlargement of the cardiac silhouette with increased prominence of the pulmonary vasculature interstitial markings. Progressive bilateral airspace opacities, greater on the right. No definite pleural fluid seen. Unremarkable bones. IMPRESSION: 1. Progressive cardiomegaly and changes of congestive heart failure. 2. Progressive bilateral alveolar edema or pneumonia, greater on the right. Electronically Signed   By: Claudie Revering M.D.   On: 04/11/2019 19:54   Dg Chest Port 1 View  Result Date: 04/08/2019 CLINICAL DATA:  Difficulty breathing for past few days, dyspnea on exertion, denies fever, history atrial fibrillation, heart failure, end-stage renal disease on dialysis, systemic hypertension, uterine cancer EXAM: PORTABLE CHEST 1 VIEW COMPARISON:  Portable exam 1645 hours compared to 08/18/2018 FINDINGS: Enlargement of cardiac silhouette. Mediastinal contours and pulmonary vascularity normal. Atherosclerotic calcification aorta. RIGHT lower lobe consolidation consistent with pneumonia. Remaining lungs clear. No pleural effusion or pneumothorax. Bones demineralized. IMPRESSION: Enlargement of cardiac silhouette. RIGHT lower lobe consolidation consistent with pneumonia. Electronically Signed   By: Lavonia Dana M.D.   On: 04/08/2019 17:18   Critical Care Time Spent: 38 minutes   Lafern Brinkley Wynetta Emery, MD  [facility] How to contact the [facility] Attending or Consulting provider [place] or covering provider during after  hours [place], for this patient?  1. Check the care team in [facility] and look for a) attending/consulting [facility] provider listed and b) the [facility] team listed 2. Log into [email or web address] and use [place]'s universal password to access. If you do not have the password, please contact the hospital operator. 3. Locate the [facility] provider you are looking for under [facility] and page to a number that you can be directly reached. 4. If you still have difficulty reaching the provider, please page the [facility] (Director on Call) for the Hospitalists listed on amion for assistance.  If 7PM-7AM, please contact night-coverage [email or web address] Password [email or web address] 04/23/2019, 2:35 PM   LOS: 13 days

## 2019-04-23 NOTE — Progress Notes (Signed)
Subjective:  BP seems to be a little better today - WBC coming down- on room air this AM Objective Vital signs in last 24 hours: Vitals:   04/23/19 0600 04/23/19 0726 04/23/19 0800 04/23/19 0843  BP: (!) 88/63  (!) 111/56   Pulse: 83 81 83   Resp: 17 14 (!) 21   Temp:  98.5 F (36.9 C)    TempSrc:  Oral    SpO2: 94% 96% 97% 100%  Weight:      Height:       Weight change: -0.8 kg  Intake/Output Summary (Last 24 hours) at 04/23/2019 0905 Last data filed at 04/22/2019 2123 Gross per 24 hour  Intake 250 ml  Output -  Net 250 ml    [facility]: [id number] Bath 2K/2.5CaTime 3.5 hrsHeparin none. AccessLAVFBFR 400DFR 600  Hectoral 9.92mcg IV/HD Epogen 2,800Units IV/HD     Assessment/ Plan: Pt is a 78 y.o. yo female with ESRD who was admitted on 04/08/2019 with initially SOB-- able to pull volume with HD but then went into Afib and now with elevated WBC - concern for ischemic colitis Assessment/Plan: 1. ID-- initially on Abx for PNA- improved and abx were stopped.  Now with elevated WBC and concern for colitis, abx resumed- per primary team- WBC is trending down 2. ESRD - normally MWF via AVF-  Initially successful with needed volume removal down to as low as 93.5 kg from 110.  Today 99.7 kg- not obviously volume overloaded, good O2 sat now on RA.  Will UF as able today  3. Anemia- hgb staying above 10 on maintenance epo- went down to 9.7  , follow  4. HTN/volume- as above- volume down overall this hosp-  No volume removal with HD Mon- have increased midodrine to 10 mg pre HD.  Afib is complicating  5. [place]-  Previous hypercalcemia- vit D on hold, has improved.  Phos is high on renvela but improving.  K a little low, gave repletion yest and do HD on 3 K bath today 6. Dispo- agree with goals of care discussion - partial code but not ready to "give up"  Will maybe need placement - is resisting right now   [place]: Basic Metabolic Panel: Recent Labs  Lab 04/21/19 0453 04/22/19 0500 04/23/19 0514  NA 133* 133* 135  K 4.5 3.1* 3.2*  CL 92* 90* 92*  CO2 24 27 25   GLUCOSE 100* 100* 109*  BUN 85* 39* 52*  CREATININE 9.19* 5.77*  5.69* 7.23*  CALCIUM 9.4 9.3 9.9  PHOS 9.6* 5.8* 5.2*   Liver Function Tests: Recent Labs  Lab 04/21/19 0453 04/22/19 0500 04/23/19 0514  ALBUMIN 2.5* 2.8* 2.8*   No results for input(s): LIPASE, AMYLASE in the last 168 hours. No results for input(s): AMMONIA in the last 168 hours. CBC: Recent Labs  Lab 04/19/19 1621 04/20/19 0510 04/21/19 0453 04/22/19 0500 04/23/19 0515  WBC 23.2* 23.8* 19.9* 14.4* 11.7*  NEUTROABS  --   --   --   --  7.7  HGB 11.4* 10.8* 10.0* 9.4* 9.7*  HCT 35.6* 33.6* 31.4* 29.0* 30.4*  MCV 110.2* 111.3* 109.0* 108.2* 108.2*  PLT 294 247 226 201 235   Cardiac Enzymes: No results for input(s): CKTOTAL, CKMB, CKMBINDEX, TROPONINI in the last 168 hours. CBG: No results for input(s): GLUCAP in the last 168 hours.  Iron Studies: No results for input(s): IRON, TIBC, TRANSFERRIN, FERRITIN in the last 72 hours.  Studies/Results: No results found. Medications: Infusions: . sodium chloride    . sodium chloride    . amiodarone 30 mg/hr (04/22/19 2027)  . piperacillin-tazobactam (ZOSYN)  IV 3.375 g (04/23/19 0817)    Scheduled Medications: . budesonide (PULMICORT) nebulizer solution  0.5 mg Nebulization BID  . Chlorhexidine Gluconate Cloth  6 each Topical Daily  . epoetin (EPOGEN/PROCRIT) injection  2,800 Units Intravenous Q M,W,F-HD  . feeding supplement (NEPRO CARB STEADY)  237 mL Oral Q1400  . gabapentin  100 mg Oral BID  . ipratropium  0.5 mg Nebulization TID  . levalbuterol  1.25 mg Nebulization TID  . mouth rinse  15 mL Mouth Rinse BID  . metoprolol tartrate  25 mg Oral BID  . midodrine  10 mg Oral Q M,W,F-HD  . pantoprazole  40 mg Oral Daily  . polyethylene glycol  17 g Oral Daily  . senna  2  tablet Oral Daily  . sevelamer carbonate  2,400 mg Oral TID WC  . sodium chloride flush  10-40 mL Intracatheter Q12H  . Warfarin - Pharmacist Dosing Inpatient   Does not apply q1800    have reviewed scheduled and prn medications.  Physical Exam: General: pale, slightly slow mentally NAD- seems a little brighter today still - up in bedside chair  Heart: irreg Lungs: dec BS at bases Abdomen: soft, non tender Extremities: trace edema Dialysis Access: left AVF- patent    04/23/2019,9:05 AM  LOS: 13 days

## 2019-04-23 NOTE — Procedures (Signed)
   HEMODIALYSIS TREATMENT NOTE:  3.5 hour heparin-free dialysis session completed via left forearm AVF (15g/antegrade). Goal met: 1.2 liters removed without interruption in ultrafiltration.  Hemodynamically stable throughout session with only use of Midodrine.  All blood was returned and hemostasis was achieved in 15 minutes.  Rockwell Alexandria, RN

## 2019-04-23 NOTE — TOC Progression Note (Signed)
Transition of Care [facility]) - Progression Note    Patient Details  Name: [name] MRN: [id number] Date of Birth: 1941-05-22  Transition of Care [facility]) CM/SW Contact  Ihor Gully, LCSW Phone Number: 04/23/2019, 2:47 PM  Clinical Narrative:    Patient states that she still has not spoken to her sons about [facility] services when she discharges. TOC will continue to follow patients for needs.    Expected Discharge Plan: [place] Barriers to Discharge: No Barriers Identified  Expected Discharge Plan and Services Expected Discharge Plan: [place]                                               Social Determinants of Health (SDOH) Interventions    Readmission Risk Interventions Readmission Risk Prevention Plan 04/15/2019 04/11/2019 04/10/2019  Transportation Screening Complete - Complete  PCP or Specialist Appt within 3-5 Days Complete Complete -  HRI or Home Care Consult Not Complete - -  Social Work Consult for [place] Planning/Counseling Complete - Complete  Palliative Care Screening Not Complete - Not Applicable  Medication Review Press photographer) Complete - Complete  Some recent data might be hidden

## 2019-04-23 NOTE — Progress Notes (Signed)
ANTICOAGULATION CONSULT NOTE   Pharmacy Consult for Coumadin Indication: atrial fibrillation  No Known Allergies  Patient Measurements: Height: 5\' 4"  (162.6 cm) Weight: 219 lb 12.8 oz (99.7 kg) IBW/kg (Calculated) : 54.7  Vital Signs: Temp: 98.5 F (36.9 C) (09/16 0726) Temp Source: Oral (09/16 0726) BP: 111/56 (09/16 0800) Pulse Rate: 83 (09/16 0800)  Labs: Recent Labs    04/21/19 0453 04/22/19 0500 04/23/19 0514 04/23/19 0515  HGB 10.0* 9.4*  --  9.7*  HCT 31.4* 29.0*  --  30.4*  PLT 226 201  --  235  LABPROT 40.6* 30.5* 16.9*  --   INR 4.3* 3.0* 1.4*  --   CREATININE 9.19* 5.77*  5.69* 7.23*  --     Estimated Creatinine Clearance: 7.2 mL/min (A) (by C-G formula based on SCr of 7.23 mg/dL (H)).   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 She presented to the ED with shortness of breath.  Patient chronically anticoagulated with warfarin.  Was started on amiodarone 9/10 and  will require lower dose due to amiodarone interaction/CHF exacerbation (recommend 25-40% lower dose).  9/14 Vitamin K 2.5mg  po given. 9/16 INR has full affect of vitamin K at INR is down to subtherapeutic dose of 1.4.   Home dose PTA:  10mg  on Monday and 7.5mg  all other days. Drug Interactions: amiodarone--> will require lower dose due to amiodarone interaction/CHF exacerbation (recommend 25-40% lower dose).   Goal of Therapy:  INR 2-3 Monitor platelets by anticoagulation protocol: Yes  Plan:  Warfarin 5 mg x 1 dose. Daily PT-INR Monitor for s/s of bleeding  Isac Sarna, BS [place], [place] Clinical Pharmacist Pager [phone] 04/23/2019 9:12 AM

## 2019-04-23 NOTE — Care Management Important Message (Signed)
Important Message  Patient Details  Name: Sabrina Beck MRN: [id number] Date of Birth: 01/16/1941   Medicare Important Message Given:  Yes     Tommy Medal 04/23/2019, 11:54 AM

## 2019-04-24 LAB — CBC WITH DIFFERENTIAL/PLATELET
Abs Immature Granulocytes: 0.12 10*3/uL — ABNORMAL HIGH (ref 0.00–0.07)
Basophils Absolute: 0.1 10*3/uL (ref 0.0–0.1)
Basophils Relative: 1 %
Eosinophils Absolute: 0.3 10*3/uL (ref 0.0–0.5)
Eosinophils Relative: 5 %
HCT: 29.2 % — ABNORMAL LOW (ref 36.0–46.0)
Hemoglobin: 9.3 g/dL — ABNORMAL LOW (ref 12.0–15.0)
Immature Granulocytes: 2 %
Lymphocytes Relative: 24 %
Lymphs Abs: 1.8 10*3/uL (ref 0.7–4.0)
MCH: 34.6 pg — ABNORMAL HIGH (ref 26.0–34.0)
MCHC: 31.8 g/dL (ref 30.0–36.0)
MCV: 108.6 fL — ABNORMAL HIGH (ref 80.0–100.0)
Monocytes Absolute: 0.9 10*3/uL (ref 0.1–1.0)
Monocytes Relative: 11 %
Neutro Abs: 4.4 10*3/uL (ref 1.7–7.7)
Neutrophils Relative %: 57 %
Platelets: 204 10*3/uL (ref 150–400)
RBC: 2.69 MIL/uL — ABNORMAL LOW (ref 3.87–5.11)
RDW: 13.9 % (ref 11.5–15.5)
WBC: 7.6 10*3/uL (ref 4.0–10.5)
nRBC: 0 % (ref 0.0–0.2)

## 2019-04-24 LAB — RENAL FUNCTION PANEL
Albumin: 2.7 g/dL — ABNORMAL LOW (ref 3.5–5.0)
Anion gap: 14 (ref 5–15)
BUN: 22 mg/dL (ref 8–23)
CO2: 27 mmol/L (ref 22–32)
Calcium: 9.6 mg/dL (ref 8.9–10.3)
Chloride: 97 mmol/L — ABNORMAL LOW (ref 98–111)
Creatinine, Ser: 4.81 mg/dL — ABNORMAL HIGH (ref 0.44–1.00)
GFR calc Af Amer: 9 mL/min — ABNORMAL LOW (ref >60)
GFR calc non Af Amer: 8 mL/min — ABNORMAL LOW (ref >60)
Glucose, Bld: 101 mg/dL — ABNORMAL HIGH (ref 70–99)
Phosphorus: 3.1 mg/dL (ref 2.5–4.6)
Potassium: 2.9 mmol/L — ABNORMAL LOW (ref 3.5–5.1)
Sodium: 138 mmol/L (ref 135–145)

## 2019-04-24 LAB — CULTURE, BLOOD (ROUTINE X 2)
Culture: NO GROWTH
Culture: NO GROWTH
Special Requests: ADEQUATE
Special Requests: ADEQUATE

## 2019-04-24 LAB — MAGNESIUM: Magnesium: 2 mg/dL (ref 1.7–2.4)

## 2019-04-24 LAB — PROTIME-INR
INR: 1.2 (ref 0.8–1.2)
Prothrombin Time: 15.3 seconds — ABNORMAL HIGH (ref 11.4–15.2)

## 2019-04-24 MED ORDER — WARFARIN SODIUM 5 MG PO TABS
10.0000 mg | ORAL_TABLET | Freq: Once | ORAL | Status: AC
  Administered 2019-04-24: 10 mg via ORAL
  Filled 2019-04-24: qty 2

## 2019-04-24 MED ORDER — POTASSIUM CHLORIDE CRYS ER 20 MEQ PO TBCR
40.0000 meq | EXTENDED_RELEASE_TABLET | Freq: Once | ORAL | Status: AC
  Administered 2019-04-24: 40 meq via ORAL
  Filled 2019-04-24: qty 2

## 2019-04-24 NOTE — TOC Initial Note (Signed)
Transition of Care [facility]) - Initial/Assessment Note    Patient Details  Name: Sabrina Beck MRN: [id number] Date of Birth: 1941-04-05  Transition of Care [facility]) CM/SW Contact:    Ihor Gully, LCSW Phone Number: 04/24/2019, 4:14 PM  Clinical Narrative:                 Patient reviewed [place] choices and referral was sent to [place] of choice and that contracts with her insurance.   Expected Discharge Plan: [place] Barriers to Discharge: No Barriers Identified   Patient Goals and CMS Choice Patient states their goals for this hospitalization and ongoing recovery are:: return home and get back to baseline. CMS Medicare.gov Compare Post Acute Care list provided to:: Patient Choice offered to / list presented to : Patient  Expected Discharge Plan and Services Expected Discharge Plan: [facility] Arranged: RN, PT [facility] Agency: [place] ([place]) Date [facility] Agency Contacted: 04/24/19 Time [place]: [phone] Representative spoke with at [place]: Early Arrangements/Services   Lives with:: Adult Children Patient language and need for interpreter reviewed:: Yes Do you feel safe going back to the place where you live?: Yes      Need for Family Participation in Patient Care: Yes (Comment) Care giver support system in place?: Yes (comment) Current home services: ([facility]) Criminal Activity/Legal Involvement Pertinent to Current Situation/Hospitalization: No - Comment as needed  Activities of Daily Living Home Assistive Devices/Equipment: Cane (specify quad or straight)(quad) ADL Screening (condition at time of admission) Patient's cognitive ability adequate to safely complete daily activities?: Yes Is the patient deaf or have difficulty hearing?: No Does the patient have difficulty seeing, even when wearing glasses/contacts?: No Does the patient have difficulty  concentrating, remembering, or making decisions?: No Patient able to express need for assistance with ADLs?: Yes Does the patient have difficulty dressing or bathing?: No Independently performs ADLs?: Yes (appropriate for developmental age) Does the patient have difficulty walking or climbing stairs?: Yes Weakness of Legs: None Weakness of Arms/Hands: None  Permission Sought/Granted Permission sought to share information with : PCP                Emotional Assessment Appearance:: Appears older than stated age   Affect (typically observed): Accepting, Calm Orientation: : Oriented to Self, Oriented to Place, Oriented to  Time, Oriented to Situation Alcohol / Substance Use: Not Applicable Psych Involvement: No (comment)  Admission diagnosis:  Shortness of breath [R06.02] ESRD (end stage renal disease) ([place]) [N18.6] Pneumonia of right lower lobe due to infectious organism [facility]) [J18.1] Patient Active Problem List   Diagnosis Date Noted  . Sepsis due to undetermined organism ([place]) 04/21/2019  . Coagulopathy ([place]) 04/21/2019  . Ischemic colitis ([place])   . CAP (community acquired pneumonia) 04/08/2019  . Permanent atrial fibrillation 08/22/2018  . Acute systolic CHF (congestive heart failure) ([place]) 08/22/2018  . Community acquired pneumonia   . HCAP (healthcare-associated pneumonia) 08/18/2018  . Acute respiratory failure with hypoxia ([place]) 08/18/2018  . Pulmonary edema 08/18/2018  . Lobar pneumonia ([place]) 08/18/2018  . Dependence on renal dialysis ([place]) 10/19/2015  . Pure hypercholesterolemia 10/19/2015  . ESRD (end stage renal disease) ([place]) 05/19/2014  . Complications due to renal dialysis device, implant, and  graft 05/14/2014  . Abscess re-check 01/19/2014  . Breast abscess of female 01/19/2014  . Chronic diastolic CHF (congestive heart failure) ([place]) 12/25/2013  . Dyspnea 12/21/2013  . CHF (congestive heart failure) ([place]) 12/21/2013  . End stage renal disease ([place])  11/06/2013  . CKD (chronic kidney disease) stage 5, GFR less than 15 ml/min (HCC) 10/21/2013  . HTN (hypertension) 10/21/2013  . Mixed hyperlipidemia 10/21/2013  . Encounter for therapeutic drug monitoring 09/03/2013  . Atrial fibrillation ([place]) 10/11/2012  . Long term current use of anticoagulant therapy 10/11/2012  . Anemia 03/27/2011  . Gastro-esophageal reflux disease without esophagitis 05/27/2010  . Malignant neoplasm of uterus ([place]) 05/27/2010   PCP:  Redmond School, MD Pharmacy:   [place], [place] - [place] [address] [place] [address] [place] [address] Phone: [phone] Fax: [phone]     Social Determinants of Health (SDOH) Interventions    Readmission Risk Interventions Readmission Risk Prevention Plan 04/15/2019 04/11/2019 04/10/2019  Transportation Screening Complete - Complete  PCP or Specialist Appt within 3-5 Days Complete Complete -  HRI or Home Care Consult Not Complete - -  Social Work Consult for [place] Planning/Counseling Complete - Complete  Palliative Care Screening Not Complete - Not Applicable  Medication Review Press photographer) Complete - Complete  Some recent data might be hidden

## 2019-04-24 NOTE — Progress Notes (Signed)
Wound care performed to R forearm per order, patient tolerated well.

## 2019-04-24 NOTE — Progress Notes (Signed)
ANTICOAGULATION CONSULT NOTE   Pharmacy Consult for Coumadin Indication: atrial fibrillation  No Known Allergies  Patient Measurements: Height: 5\' 4"  (162.6 cm) Weight: 217 lb 13 oz (98.8 kg) IBW/kg (Calculated) : 54.7  Vital Signs: Temp: 98.6 F (37 C) (09/17 0743) Temp Source: Oral (09/17 0743) BP: 120/60 (09/17 0800) Pulse Rate: 100 (09/17 0900)  Labs: Recent Labs    04/22/19 0500 04/23/19 0514 04/23/19 0515 04/24/19 0420  HGB 9.4*  --  9.7* 9.3*  HCT 29.0*  --  30.4* 29.2*  PLT 201  --  235 204  LABPROT 30.5* 16.9*  --  15.3*  INR 3.0* 1.4*  --  1.2  CREATININE 5.77*  5.69* 7.23*  --  4.81*    Estimated Creatinine Clearance: 10.8 mL/min (A) (by C-G formula based on SCr of 4.81 mg/dL (H)).   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 She presented to the ED with shortness of breath.  9/14 Vitamin K 2.5mg  po given. 9/16  INR is down to subtherapeutic dose of 1.4 due to vitamin K- amiodarone stopped 9/17: INR 1.2: will increase back to patient's usual dose since amiodarone discontinued.   Home dose PTA:  10mg  on Monday and 7.5mg  all other days.  Goal of Therapy:  INR 2-3 Monitor platelets by anticoagulation protocol: Yes  Plan:  Warfarin 10 mg x 1 dose. Daily PT-INR Monitor for s/s of bleeding  Margot Ables, PharmD Clinical Pharmacist 04/24/2019 10:54 AM

## 2019-04-24 NOTE — Progress Notes (Signed)
PROGRESS NOTE  Sabrina Beck WGY:[id number] DOB: July 18, 1941 DOA: 04/08/2019 PCP: Redmond School, MD   Brief History: 78 y.o.femalewith medical history significant ofend-stage renal disease, permanent atrial fibrillation on chronic anticoagulation with Coumadin, chronicsystolic anddiastolic CHF, hypertension, GERD, presents to the hospital with chief complaint of significant shortness of breathx 5 days.She reports that it felt like she just could not take a deep breath in. Patient deniedany chest pain, coughing, fever, dizziness, n/v/d.  The patient was started on IV vanco and cefepime and treated for PNA. Her abx were de-escalated.She overall improved, but she developed Afib with RVR with soft BPs.  Cardiology was consulted and pt was started on IV amio and esmolol.  Her esmolol was weaned off as she improved.  Unfortunately, her WBC increased up to 23K on 04/19/19 and she subsequently became hypotensive again.  Vanco and zosyn were restarted.  ED course Chemistry shows a glucose of 140, BUN of 45, creatinine of 9.98, and GFR of 3. Patient has a BNP of 344. Initial troponin is 20. Hematology reveals no leukocytosis, but a macrocytic anemia with a hemoglobin of 11. Chest x-ray was done that shows enlargement of cardiac silhouette. Right lower lobe consolidation consistent with pneumonia. Blood cultures drawn. Lactic acid 1.6. Troponin stable at 22. Second lactic acid 1.3.  On 04/19/19, the patient's WBC increased, but she remained afebrile.  Subsequently, pt became hypotensive again. Blood cultures x 2 sets were repeated and pt was restarted on zosyn and vanco empirically. Her BPs became soft again, and she remained on amiodarone and metoprolol dose was decreased.  GI was consulted for colitis.  CT chest showed bibasilar consolidation.  Femoral central line was discontinued and right IJ PICC line was placed.  Fortunately, repeat blood cultures remained neg. Vancomycin was  discontinued and zosyn continued.    Assessment/Plan: Acute hypoxic respiratory failure with hypoxia due tolobar pneumoniaas well as pulmonary edema -04/08/19--Patient was admitted to stepdown and placed on supplemental oxygen.  -weaned to RA, but new oxygen requirement again on 9/12 -Nephrologyfollow up appreciated -Continuepulmicort to 0.5 mg bid during hospitalization -pt endorsed some indiscretion with fluidsat home -1/16--Echo--EF 45%, mid inferior septal HK, PASP 21, mild TR/MR -on 04/19/19--WBC started trending back up, restarted antibiotics.   Sepsis - resolved now -due to HCAP and colitis -d/c vanco -continue zosyn -04/19/19--procalcitonin3.14>>>4.02 -04/21/19 BPs hypotensive -04/22/19 BP slowly improving with decreasing WBC  HCAP/Lobar Pneumonia -04/19/19 WBC went up -04/19/19--procalcitonin3.14>>>4.02 -04/19/19 blood cultures x 2 sets--neg to date -continue zosyn -04/20/19 CT chest--collapse, consolidation in bilateral LL, small bilateral pleural effusion, ULN mediastinal LNs  IV infiltration right forearm - continue warm compresses and appreciate wound care consultation and recommendations.  This seems to be improving.    Colitis -suspected ischemic colitis per GI  -04/19/19--CT abd/pelvis--segmental thickening+adjacent percolonic hazy mesenteric stranding -consult GI--appreciated-->manage medically -continue Zosynthru 9/17  Atrial fibrillation, permanent, with RVR -Due to fluid overload,pneumonia, hypoxia -she was placed onesmololinfusionby cardiology-->weaned off 9/11 -restartedmetoprolol--increase to 37.5 mg bid>>>decrease to 25 bid -Continue Coumadin per pharmacy -rate control difficult due to low BPsfor nearly one week-->rate now improving -Appreciate cardiology consultation -amiodarone drip started 04/17/19-->continue for now in setting of new infectious process and high risk of RVR -DC amiodarone infusion.   Coagulopathy -INR trending up even  without warfarin -due to sepsis -04/21/19 give small dose vitamin K--2.5 mg  Poor IV access -femoral CVL placed 04/17/19-->removed 04/20/19 -R-IJ PICC line placed 7/79/39  chronicsystolic and diastolicCHF -Given the decrease  in EF, discontinueddiltiazem in Jan 2020 -08/22/18--Echo--EF 45%, mid inferior septal HK, PASP 21, mild TR/MR -continue metoprolol tartrate 25 mg bid -wt down from 106 to97.7Kg. -now appears euvolemic  ESRD -last HD 04/15/19--UF only -HD on 9/9and9/11 -appreciate nephrology -midodrine on HD to help maintain BP  Bronchospasm -Continue Pulmicort to 0.5 mg bid -prednisone was discontinued -Continueatrovent/Xopenexneb -improved  Hypercalcemia -Hectorol on hold -per nephrology  Hypertension -Blood pressure soft again -decrease metoprolol  Anemia of end-stage renal disease -Hemoglobin stable, no bleeding -continue ESA  GERD -Continue home PPI   Goals of Care -consulted palliative medicine for goals of care discussion -no CPR, but yes to intubation  Disposition Plan: remain in SDU Family Communication:son updated at bedside 04/22/19  Consultants:renal, cardiology  Code Status: Partial   DVT Prophylaxis:warfarin  Procedures: Femoral CVL--9/10>>>>9/13 R-IJ PICC 9/13>>>  Antibiotics: vanc 9/2 Ceftriaxone 9/1 Cefepime 9/2>>>9/5 Amox/clav 9/6>>>9/10 vanco 9/12>>>9/15 Zosyn 9/12>>>  The patient is critically ill with multiple organ systems failure and requires high complexity decision making for assessment and support, frequent evaluation and titration of therapies, application of advanced monitoring technologies and extensive interpretation of multiple databases.   Subjective: Pt reports less pain in right forearm.   Objective: Vitals:   04/24/19 0800 04/24/19 0900 04/24/19 1000 04/24/19 1134  BP: 120/60     Pulse: 93 100 89 89  Resp: 14 19 18 16   Temp:    98.5 F (36.9 C)  TempSrc:    Oral  SpO2: 99%  94% 99% 99%  Weight:      Height:        Intake/Output Summary (Last 24 hours) at 04/24/2019 1238 Last data filed at 04/24/2019 0831 Gross per 24 hour  Intake 399.69 ml  Output 1200 ml  Net -800.31 ml   Weight change: 0.2 kg Exam:   General:  Pt is alert, follows commands appropriately, not in acute distress, oriented x 3.   HEENT: No icterus, No thrush, No neck mass, [place]/AT  Cardiovascular: irregular, normal S1/S2, no rubs, no gallops  Respiratory: BBS with fine basilar crackles.   Abdomen: Soft/+BS, non tender, non distended, no guarding  Extremities: IV infiltration right forearm, less edema, no drainage, no cyanosis or clubbing.   Neurological: nonfocal exam.   Data Reviewed: I have personally reviewed following labs and imaging studies Basic Metabolic Panel: Recent Labs  Lab 04/18/19 0456  04/20/19 0510 04/21/19 0453 04/22/19 0500 04/23/19 0514 04/23/19 0515 04/24/19 0420  NA 136   < > 133* 133* 133* 135  --  138  K 4.4   < > 4.5 4.5 3.1* 3.2*  --  2.9*  CL 97*   < > 92* 92* 90* 92*  --  97*  CO2 22   < > 23 24 27 25   --  27  GLUCOSE 116*   < > 117* 100* 100* 109*  --  101*  BUN 57*   < > 63* 85* 39* 52*  --  22  CREATININE 7.00*   < > 7.49* 9.19* 5.77*   5.69* 7.23*  --  4.81*  CALCIUM 9.6   < > 9.7 9.4 9.3 9.9  --  9.6  MG 2.5*  --   --   --   --   --  2.2 2.0  PHOS 8.6*   < > 8.2* 9.6* 5.8* 5.2*  --  3.1   < > = values in this interval not displayed.   Liver Function Tests: Recent Labs  Lab 04/20/19 0510 04/21/19 0453  04/22/19 0500 04/23/19 0514 04/24/19 0420  ALBUMIN 2.9* 2.5* 2.8* 2.8* 2.7*   No results for input(s): LIPASE, AMYLASE in the last 168 hours. No results for input(s): AMMONIA in the last 168 hours. Coagulation Profile: Recent Labs  Lab 04/20/19 0510 04/21/19 0453 04/22/19 0500 04/23/19 0514 04/24/19 0420  INR 3.6* 4.3* 3.0* 1.4* 1.2   CBC: Recent Labs  Lab 04/20/19 0510 04/21/19 0453 04/22/19 0500 04/23/19 0515  04/24/19 0420  WBC 23.8* 19.9* 14.4* 11.7* 7.6  NEUTROABS  --   --   --  7.7 4.4  HGB 10.8* 10.0* 9.4* 9.7* 9.3*  HCT 33.6* 31.4* 29.0* 30.4* 29.2*  MCV 111.3* 109.0* 108.2* 108.2* 108.6*  PLT 247 226 201 235 204   Cardiac Enzymes: No results for input(s): CKTOTAL, CKMB, CKMBINDEX, TROPONINI in the last 168 hours. BNP: Invalid input(s): POCBNP CBG: No results for input(s): GLUCAP in the last 168 hours. HbA1C: No results for input(s): HGBA1C in the last 72 hours. Urine analysis:    Component Value Date/Time   COLORURINE YELLOW 03/03/2012 1500   APPEARANCEUR CLEAR 03/03/2012 1500   LABSPEC 1.020 03/03/2012 1500   PHURINE 6.0 03/03/2012 1500   GLUCOSEU 100 (A) 03/03/2012 1500   HGBUR TRACE (A) 03/03/2012 1500   BILIRUBINUR NEGATIVE 03/03/2012 1500   KETONESUR NEGATIVE 03/03/2012 1500   PROTEINUR 100 (A) 03/03/2012 1500   UROBILINOGEN 0.2 03/03/2012 1500   NITRITE NEGATIVE 03/03/2012 1500   LEUKOCYTESUR NEGATIVE 03/03/2012 1500    Recent Results (from the past 240 hour(s))  MRSA PCR Screening     Status: None   Collection Time: 04/14/19  2:38 PM   Specimen: Nasal Mucosa; Nasopharyngeal  Result Value Ref Range Status   MRSA by PCR NEGATIVE NEGATIVE Final    Comment:        The GeneXpert MRSA Assay (FDA approved for NASAL specimens only), is one component of a comprehensive MRSA colonization surveillance program. It is not intended to diagnose MRSA infection nor to guide or monitor treatment for MRSA infections. Performed at [facility], [address], [place], [place] [address]   Culture, blood (Routine X 2) w Reflex to ID Panel     Status: None   Collection Time: 04/19/19  4:21 PM   Specimen: BLOOD RIGHT HAND  Result Value Ref Range Status   Specimen Description   Final    BLOOD RIGHT HAND BOTTLES DRAWN AEROBIC AND ANAEROBIC   Special Requests Blood Culture adequate volume  Final   Culture   Final    NO GROWTH 5 DAYS Performed at [facility], [address], [place], [place] [address]    Report Status 04/24/2019 FINAL  Final  Culture, blood (Routine X 2) w Reflex to ID Panel     Status: None   Collection Time: 04/19/19  4:29 PM   Specimen: Leg; Blood  Result Value Ref Range Status   Specimen Description LEG BOTTLES DRAWN AEROBIC AND ANAEROBIC  Final   Special Requests Blood Culture adequate volume  Final   Culture   Final    NO GROWTH 5 DAYS Performed at [facility], [address] [place], [place] [address]    Report Status 04/24/2019 FINAL  Final     Scheduled Meds:  budesonide (PULMICORT) nebulizer solution  0.5 mg Nebulization BID   Chlorhexidine Gluconate Cloth  6 each Topical Daily   epoetin (EPOGEN/PROCRIT) injection  2,800 Units Intravenous Q M,W,F-HD   feeding supplement (NEPRO CARB STEADY)  237 mL Oral Q1400  gabapentin  100 mg Oral BID   ipratropium  0.5 mg Nebulization TID   levalbuterol  1.25 mg Nebulization TID   mouth rinse  15 mL Mouth Rinse BID   metoprolol tartrate  25 mg Oral BID   midodrine  10 mg Oral Q M,W,F-HD   pantoprazole  40 mg Oral Daily   polyethylene glycol  17 g Oral Daily   senna  2 tablet Oral Daily   sevelamer carbonate  2,400 mg Oral TID WC   sodium chloride flush  10-40 mL Intracatheter Q12H   warfarin  10 mg Oral Once   Warfarin - Pharmacist Dosing Inpatient   Does not apply q1800   Continuous Infusions:  sodium chloride     sodium chloride     piperacillin-tazobactam (ZOSYN)  IV 3.375 g (04/24/19 0828)    Procedures/Studies: Ct Abdomen Pelvis Wo Contrast  Result Date: 04/19/2019 CLINICAL DATA:  Abdominal pain, gastroenteritis or colitis suspected EXAM: CT ABDOMEN AND PELVIS WITHOUT CONTRAST TECHNIQUE: Multidetector CT imaging of the abdomen and pelvis was performed following the standard protocol without IV contrast. COMPARISON:  None. FINDINGS: Lower chest: Multifocal consolidative opacities are present in the lung bases. More masslike consolidation is present in the  left lower lobe. Diffuse airways thickening is noted in the bases. Dependent atelectatic changes noted posteriorly. Trace effusions are present as well. There is cardiomegaly with biatrial enlargement. Atherosclerotic calcification of the coronary arteries. Atherosclerotic calcifications are present upon the aortic leaflets and within the mitral valve annulus. Hepatobiliary: No focal liver abnormality is seen. Patient is post cholecystectomy. Slight prominence of the biliary tree likely related to reservoir effect. No calcified intraductal gallstones. Pancreas: Unremarkable. No pancreatic ductal dilatation or surrounding inflammatory changes. Spleen: Normal in size without focal abnormality. Adrenals/Urinary Tract: Normal adrenal glands. Severe atrophy of both kidneys with multiple vascular calcifications in the renal hila and few lobular subcentimeter renal lesions likely reflecting cysts. These are too small to fully characterize on CT imaging but statistically likely benign. Stomach/Bowel: Distal esophagus, stomach and duodenal sweep are unremarkable. No bowel wall thickening or dilatation. No evidence of obstruction. A normal appendix is visualized. There is segmental thickening and adjacent pericolonic hazy mesenteric stranding involving the transverse colon and proximal descending colon. There are numerous pancolonic diverticula however inflammation is not appear centered upon a focal culprit diverticulum. No resulting obstruction. Vascular/Lymphatic: Severe atherosclerotic calcification. Evaluation of vascular patency is limited by the absence of intravenous contrast. No suspicious or enlarged lymph nodes in the included lymphatic chains. Reproductive: Uterus is surgically absent. No concerning adnexal lesions. Other: No abdominopelvic free fluid or free gas. No bowel containing hernias. Fat containing ventral/umbilical hernia. Anterior abdominal wall laxity. Small amount of soft tissue stranding/edema along  the left lateral abdominal wall. Musculoskeletal: Multilevel degenerative changes are present in the imaged portions of the spine. IMPRESSION: 1. Segmental thickening and adjacent pericolonic hazy mesenteric stranding involving the transverse colon and proximal descending colon, consistent with colitis, which could be infectious, inflammatory or potentially vascular in etiology. Evaluation of the vasculature is limited in the absence of contrast media. 2. Multifocal areas of airspace opacity in both lower lobes. More masslike consolidation is present in the left lung base. Recommend reimaging following therapeutic intervention to ensure resolution. 3. Extensive pancolonic diverticulosis without evidence of acute diverticulitis. 4. Bilateral renal atrophy. Scattered cystic lesions in both kidneys, too small characterize on CT imaging. 5. Small fat containing ventral hernia. 6. Aortic Atherosclerosis (ICD10-I70.0). Electronically Signed   By: Lovena Le  M.D.   On: 04/19/2019 22:36   Dg Chest 2 View  Result Date: 04/10/2019 CLINICAL DATA:  Shortness of breath. EXAM: CHEST - 2 VIEW COMPARISON:  04/08/2019 and 08/18/2018 FINDINGS: Cardiac size is within normal limits. Aortic atherosclerosis. New pulmonary vascular congestion. New bilateral perihilar infiltrates. Persistent hazy infiltrate at the right lung base posteriorly. Minimal blunting of the posterior costophrenic angles. No acute bone abnormality. IMPRESSION: New bilateral perihilar infiltrates. Persistent infiltrate at the right lung base. This could represent pulmonary edema or multifocal progressive pneumonia. Aortic Atherosclerosis (ICD10-I70.0). Electronically Signed   By: Lorriane Shire M.D.   On: 04/10/2019 11:04   Ct Head Wo Contrast  Result Date: 04/11/2019 CLINICAL DATA:  Transient ischemic attack.  Slurred speech. EXAM: CT HEAD WITHOUT CONTRAST TECHNIQUE: Contiguous axial images were obtained from the base of the skull through the vertex without  intravenous contrast. COMPARISON:  CT scan of September 05, 2014. FINDINGS: Brain: No evidence of acute infarction, hemorrhage, hydrocephalus, extra-axial collection or mass lesion/mass effect. Vascular: No hyperdense vessel or unexpected calcification. Skull: Normal. Negative for fracture or focal lesion. Sinuses/Orbits: No acute finding. Other: None. IMPRESSION: No acute intracranial abnormality seen. Electronically Signed   By: Marijo Conception M.D.   On: 04/11/2019 12:08   Ct Chest Wo Contrast  Result Date: 04/20/2019 CLINICAL DATA:  Masslike opacity seen in the lower lobes on abdomen CT yesterday. EXAM: CT CHEST WITHOUT CONTRAST TECHNIQUE: Multidetector CT imaging of the chest was performed following the standard protocol without IV contrast. COMPARISON:  Abdomen CT 04/19/2019 FINDINGS: Cardiovascular: Heart is enlarged. Coronary artery calcification is evident. Atherosclerotic calcification is noted in the wall of the thoracic aorta. Right IJ central line tip is in the distal SVC. Mediastinum/Nodes: Upper normal to borderline enlarged precarinal and subcarinal lymph nodes are identified. Precarinal 10 mm node identified on 52/2 in there is 11 mm subcarinal node on 63/2. No evidence for gross hilar lymphadenopathy although assessment is limited by the lack of intravenous contrast on today's study. The esophagus has normal imaging features. There is no axillary lymphadenopathy. Lungs/Pleura: Collapse/consolidative changes seen in both lower lobes tracking from the inferior hilar regions posteriorly and inferiorly into the lung bases. Probable atelectasis anterior right upper lobe. No overt pulmonary edema. Small bilateral pleural effusions noted. Upper Abdomen: Unremarkable. Musculoskeletal: No worrisome lytic or sclerotic osseous abnormality. IMPRESSION: Bilateral lower lobe collapse/consolidation compatible with pneumonia. Small bilateral pleural effusions. Upper normal to borderline enlarged central  mediastinal lymph nodes, presumably reactive. Follow-up CT chest in 3 months could be used to ensure stability. Electronically Signed   By: Misty Stanley M.D.   On: 04/20/2019 17:30   Dg Chest Port 1 View  Result Date: 04/20/2019 CLINICAL DATA:  Central line placement EXAM: PORTABLE CHEST 1 VIEW COMPARISON:  04/15/2019 FINDINGS: Limited rotated portable exam to the left. Right IJ central line tip projects over the spine related to the rotation with the tip in the mid right atrium level. This can be retracted 3.5 cm toi the SVC RA junction. Once completed, consider repeat chest x-ray without rotation to confirm tip position. Stable cardiomegaly and vascular congestion. Lower lung volumes with increased bibasilar atelectasis/airspace disease worse in the left lower lobe obscuring the left hemidiaphragm. Small left effusion not excluded. No pneumothorax. Aorta atherosclerotic. IMPRESSION: Right IJ central line tip as detailed above. See above comment and recommendation. Cardiomegaly with vascular congestion Lower lung volumes with increased basilar atelectasis/consolidation worse on the left. Electronically Signed   By: Jerilynn Mages.  Shick M.D.  On: 04/20/2019 10:58   Dg Chest Port 1 View  Result Date: 04/15/2019 CLINICAL DATA:  Shortness of breath. EXAM: PORTABLE CHEST 1 VIEW COMPARISON:  Radiographs of April 13, 2019. FINDINGS: Stable cardiomegaly. No pneumothorax or definite pleural effusion is noted. Stable bilateral lung opacities are noted concerning for edema or inflammation. Bony thorax is unremarkable. IMPRESSION: Stable bilateral lung opacities as described above. Electronically Signed   By: Marijo Conception M.D.   On: 04/15/2019 07:31   Dg Chest Port 1 View  Result Date: 04/13/2019 CLINICAL DATA:  Shortness of breath and hypoxia in a patient with a history of end-stage renal disease. EXAM: PORTABLE CHEST 1 VIEW COMPARISON:  Single-view of the chest 04/11/2019. PA and lateral chest 04/10/2019. FINDINGS:  Bilateral airspace disease has improved but there is persistent focal airspace opacity in the right mid and lower lung zones. There is cardiomegaly. No pneumothorax. Small bilateral pleural effusions. IMPRESSION: Persistent but improved bilateral airspace disease compatible with decreased pulmonary edema. More focal airspace opacity in the right midlung could be due to edema or pneumonia. Electronically Signed   By: Inge Rise M.D.   On: 04/13/2019 09:08   Dg Chest Port 1 View  Result Date: 04/11/2019 CLINICAL DATA:  Increasing shortness of breath following dialysis. EXAM: PORTABLE CHEST 1 VIEW COMPARISON:  04/10/2019. FINDINGS: Poor inspiration. Progressive enlargement of the cardiac silhouette with increased prominence of the pulmonary vasculature interstitial markings. Progressive bilateral airspace opacities, greater on the right. No definite pleural fluid seen. Unremarkable bones. IMPRESSION: 1. Progressive cardiomegaly and changes of congestive heart failure. 2. Progressive bilateral alveolar edema or pneumonia, greater on the right. Electronically Signed   By: Claudie Revering M.D.   On: 04/11/2019 19:54   Dg Chest Port 1 View  Result Date: 04/08/2019 CLINICAL DATA:  Difficulty breathing for past few days, dyspnea on exertion, denies fever, history atrial fibrillation, heart failure, end-stage renal disease on dialysis, systemic hypertension, uterine cancer EXAM: PORTABLE CHEST 1 VIEW COMPARISON:  Portable exam 1645 hours compared to 08/18/2018 FINDINGS: Enlargement of cardiac silhouette. Mediastinal contours and pulmonary vascularity normal. Atherosclerotic calcification aorta. RIGHT lower lobe consolidation consistent with pneumonia. Remaining lungs clear. No pleural effusion or pneumothorax. Bones demineralized. IMPRESSION: Enlargement of cardiac silhouette. RIGHT lower lobe consolidation consistent with pneumonia. Electronically Signed   By: Lavonia Dana M.D.   On: 04/08/2019 17:18   Critical  Care Time Spent: 32 minutes   Devin Foskey Wynetta Emery, MD  [facility] How to contact the [facility] Attending or Consulting provider [place] or covering provider during after hours [place], for this patient?  1. Check the care team in [facility] and look for a) attending/consulting [facility] provider listed and b) the [facility] team listed 2. Log into [email or web address] and use [place]'s universal password to access. If you do not have the password, please contact the hospital operator. 3. Locate the [facility] provider you are looking for under [facility] and page to a number that you can be directly reached. 4. If you still have difficulty reaching the provider, please page the [facility] (Director on Call) for the Hospitalists listed on amion for assistance.  If 7PM-7AM, please contact night-coverage [email or web address] Password [email or web address] 04/24/2019, 12:38 PM   LOS: 14 days

## 2019-04-24 NOTE — Consult Note (Signed)
[place] Nurse wound consult note Patient receiving care in [place].  Consult completed via [place] and assistance from primary RN, [employer]. Reason for Consult: forearm wound, ventral surface Wound type: Sloughing and discoloration from an infiltrated IV from unknown IV fluid and unknown exact length of time wound has been present. Measurement: The area was covered by a 4 x 4 inch foam dressing and the discoloration extended to the borders of the foam dressing Wound bed: Tissue is sloughing off.  Where the tissue is off, the wound bed is pink.  It is surrounded by purple discoloration.  There is a small black spot to the medial aspect of the wound. Drainage (amount, consistency, odor) Some yellow on the existing dressing Periwound: Beyond the borders of discoloration, intact and normal in appearance.  This wound may deteriorate further, and in that circumstance other care team members, such as surgery, may need to be consulted. Dressing procedure/placement/frequency: Cleanse right forearm wound with soap and water. Pat dry. Cover with Xeroform gauze Kellie Simmering [phone]), and then a foam dressing.  Change the Xeroform every day. Change the foam every 3 days and prn. Monitor the wound area(s) for worsening of condition such as: Signs/symptoms of infection,  Increase in size,  Development of or worsening of odor, Development of pain, or increased pain at the affected locations.  Notify the medical team if any of these develop.  Thank you for the consult.  Discussed plan of care with the patient and bedside nurse.  [place] nurse will not follow at this time.  Please re-consult the [place] team if needed.  Val Riles, RN, MSN, CWOCN, CNS-BC, pager [phone]

## 2019-04-24 NOTE — Progress Notes (Signed)
Patient ID: Sabrina Beck, female   DOB: 07-01-1941, 78 y.o.   MRN: [id number] S: No new complaints today. O:BP 120/60   Pulse 100   Temp 98.6 F (37 C) (Oral)   Resp 19   Ht 5\' 4"  (1.626 m)   Wt 98.8 kg   SpO2 94%   BMI 37.39 kg/m   Intake/Output Summary (Last 24 hours) at 04/24/2019 1020 Last data filed at 04/24/2019 0831 Gross per 24 hour  Intake 1245.89 ml  Output 1200 ml  Net 45.89 ml   Intake/Output: I/O last 3 completed shifts: In: 1306.2 [P.O.:460; I.V.:660.8; IV Piggyback:185.4] Out: 1200 [Other:1200]  Intake/Output this shift:  Total I/O In: 179.7 [IV Piggyback:179.7] Out: -  Weight change: 0.2 kg Gen: NAD CVS: no rub Resp: cta Abd: +BS, soft, NT Ext: no edema, LAVF +T/B  Recent Labs  Lab 04/18/19 0456 04/19/19 0522 04/20/19 0510 04/21/19 0453 04/22/19 0500 04/23/19 0514 04/24/19 0420  NA 136 136 133* 133* 133* 135 138  K 4.4 4.6 4.5 4.5 3.1* 3.2* 2.9*  CL 97* 95* 92* 92* 90* 92* 97*  CO2 22 27 23 24 27 25 27   GLUCOSE 116* 124* 117* 100* 100* 109* 101*  BUN 57* 35* 63* 85* 39* 52* 22  CREATININE 7.00* 4.89* 7.49* 9.19* 5.77*  5.69* 7.23* 4.81*  ALBUMIN 3.2* 3.4* 2.9* 2.5* 2.8* 2.8* 2.7*  CALCIUM 9.6 10.0 9.7 9.4 9.3 9.9 9.6  PHOS 8.6* 6.5* 8.2* 9.6* 5.8* 5.2* 3.1   Liver Function Tests: Recent Labs  Lab 04/22/19 0500 04/23/19 0514 04/24/19 0420  ALBUMIN 2.8* 2.8* 2.7*   No results for input(s): LIPASE, AMYLASE in the last 168 hours. No results for input(s): AMMONIA in the last 168 hours. CBC: Recent Labs  Lab 04/20/19 0510 04/21/19 0453 04/22/19 0500 04/23/19 0515 04/24/19 0420  WBC 23.8* 19.9* 14.4* 11.7* 7.6  NEUTROABS  --   --   --  7.7 4.4  HGB 10.8* 10.0* 9.4* 9.7* 9.3*  HCT 33.6* 31.4* 29.0* 30.4* 29.2*  MCV 111.3* 109.0* 108.2* 108.2* 108.6*  PLT 247 226 201 235 204   Cardiac Enzymes: No results for input(s): CKTOTAL, CKMB, CKMBINDEX, TROPONINI in the last 168 hours. CBG: No results for input(s): GLUCAP in the last 168  hours.  Iron Studies: No results for input(s): IRON, TIBC, TRANSFERRIN, FERRITIN in the last 72 hours. Studies/Results: No results found. . budesonide (PULMICORT) nebulizer solution  0.5 mg Nebulization BID  . Chlorhexidine Gluconate Cloth  6 each Topical Daily  . epoetin (EPOGEN/PROCRIT) injection  2,800 Units Intravenous Q M,W,F-HD  . feeding supplement (NEPRO CARB STEADY)  237 mL Oral Q1400  . gabapentin  100 mg Oral BID  . ipratropium  0.5 mg Nebulization TID  . levalbuterol  1.25 mg Nebulization TID  . mouth rinse  15 mL Mouth Rinse BID  . metoprolol tartrate  25 mg Oral BID  . midodrine  10 mg Oral Q M,W,F-HD  . pantoprazole  40 mg Oral Daily  . polyethylene glycol  17 g Oral Daily  . potassium chloride  40 mEq Oral Once  . senna  2 tablet Oral Daily  . sevelamer carbonate  2,400 mg Oral TID WC  . sodium chloride flush  10-40 mL Intracatheter Q12H  . Warfarin - Pharmacist Dosing Inpatient   Does not apply q1800    BMET    Component Value Date/Time   NA 138 04/24/2019 0420   NA 142 06/08/2015 0735   K 2.9 (L) 04/24/2019 3790  CL 97 (L) 04/24/2019 0420   CO2 27 04/24/2019 0420   GLUCOSE 101 (H) 04/24/2019 0420   BUN 22 04/24/2019 0420   BUN 37 (H) 06/08/2015 0735   CREATININE 4.81 (H) 04/24/2019 0420   CALCIUM 9.6 04/24/2019 0420   CALCIUM 8.1 (L) 01/11/2012 1039   GFRNONAA 8 (L) 04/24/2019 0420   GFRAA 9 (L) 04/24/2019 0420   CBC    Component Value Date/Time   WBC 7.6 04/24/2019 0420   RBC 2.69 (L) 04/24/2019 0420   HGB 9.3 (L) 04/24/2019 0420   HCT 29.2 (L) 04/24/2019 0420   HCT 28.8 (L) 04/09/2019 0558   PLT 204 04/24/2019 0420   MCV 108.6 (H) 04/24/2019 0420   MCH 34.6 (H) 04/24/2019 0420   MCHC 31.8 04/24/2019 0420   RDW 13.9 04/24/2019 0420   LYMPHSABS 1.8 04/24/2019 0420   MONOABS 0.9 04/24/2019 0420   EOSABS 0.3 04/24/2019 0420   BASOSABS 0.1 04/24/2019 0420     [id number] [place]  2K/2.5CaTime 3.5 hrsHeparin none. AccessLAVFBFR 400DFR 600  Hectoral 9.76mcg IV/HD Epogen 2,800Units IV/HD   Assessment/Plan: 1. ID- finished 8 days of antibiotics during hospitalization for presumed HCAP and now with rising WBC and concern for colitis.  Now on zosyn per primary svc 2. A Fib with RVR- rate controlled with metoprolol and bp improved.  Coumadin per primary svc.  Cardiology had been following.   3. ESRD- She has had daily HD with improvement of her pulmonary edema.  Off yesterday and will plan on HD tomorrowto keep on schedule.  Will continue with midodrine prior to HD to help maintain BP and help with UF as well as IV albumin but tolerated HD yesterday. 4. Hypertension/volume- has improved with daily dialysis. Plan for uf as bp tolerates and weigh standing up if able 5. Anemia- Cont with ESA and follow. Hgbcontinues to drop. Guaiac stools and follow h/h. 1. Will check cbc prior to HD today. 6. Metabolic bone disease with hypercalcemia- hectoral on hold and Ca has improved.  Her iPTH was 154 so likely too high of a dose of vit D. F/u with outpatient HD unit and cont to hold vit D for now.  7. Nutrition- renal diet 8. Disposition- Agree with partial code status.  Pt with significant debilitation and will need PT/OT and likely SNF placement.    Donetta Potts, MD [employer] [phone]

## 2019-04-24 NOTE — Progress Notes (Signed)
Physical Therapy Treatment Patient Details Name: Sabrina Beck MRN: [id number] DOB: May 19, 1941 Today's Date: 04/24/2019    History of Present Illness 78 y.o. 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  Patient had been getting progressively short of breath over 5 days.  She reports that it felt like she just could not take a deep breath in.  Patient denies any chest pain, coughing, fever, dizziness.    PT Comments    REASSESSMENT:  Patient demonstrates slow labored movement requiring use of bed rail for sitting up at bedside, completed BLE ROM/strengthening exercises while seated at bedside with SpO2 dropping from 95% to 87% while on room air and put back on 2 LPM O2 for gait training.  Patient unsteady on feet and has to lean on nearby objects for support when using quad-cane, safer using RW and demonstrated slightly increased distance for ambulation out of room and tolerated sitting up in chair after therapy - RN notified.  Patient will benefit from continued physical therapy in hospital and recommended venue below to increase strength, balance, endurance for safe ADLs and gait.   Follow Up Recommendations  Home health PT;Supervision for mobility/OOB     Equipment Recommendations  None recommended by PT    Recommendations for Other Services       Precautions / Restrictions Precautions Precautions: Fall Restrictions Weight Bearing Restrictions: No    Mobility  Bed Mobility Overal bed mobility: Needs Assistance Bed Mobility: Supine to Sit     Supine to sit: Supervision     General bed mobility comments: slow labored movement  Transfers Overall transfer level: Needs assistance Equipment used: Quad cane;Rolling walker (2 wheeled) Transfers: Sit to/from [employer] Sit to Stand: Supervision;Min guard Stand pivot transfers: Supervision;Min guard       General transfer comment: slow labored movement, had to lean on armrest of chair using quad-cane, safer using RW  Ambulation/Gait Ambulation/Gait assistance: Supervision;Min guard Gait Distance (Feet): 40 Feet Assistive device: Rolling walker (2 wheeled) Gait Pattern/deviations: Decreased step length - right;Decreased step length - left;Decreased stride length Gait velocity: decreased   General Gait Details: slow labored cadence with leaning on armrest of chair for support when using quad-cane, used RW for safety and demonstrates slightly increased tolerance for ambulation out of room, on 2 LPM with SpO2 at 95-97%, limited secondary to fatigue and mild SOB   Stairs             Wheelchair Mobility    Modified Rankin (Stroke Patients Only)       Balance Overall balance assessment: Needs assistance Sitting-balance support: Feet supported;No upper extremity supported Sitting balance-Leahy Scale: Good Sitting balance - Comments: seated at bedside   Standing balance support: During functional activity;Single extremity supported Standing balance-Leahy Scale: Fair Standing balance comment: fair/poor using quad-cane, fair/good using RW                            Cognition Arousal/Alertness: Awake/alert Behavior During Therapy: WFL for tasks assessed/performed Overall Cognitive Status: Within Functional Limits for tasks assessed                                        Exercises General Exercises - Lower Extremity Ankle  Circles/Pumps: Seated;AROM;Strengthening;Left;10 reps Long Arc Quad: Seated;AROM;Strengthening;Both;10 reps Hip Flexion/Marching: Seated;AROM;Strengthening;Both;10 reps    General Comments        Pertinent Vitals/Pain Pain Assessment: No/denies pain    Home Living                      Prior Function             PT Goals (current goals can now be found in the care plan section) Acute Rehab PT Goals Patient Stated Goal: return home with family to assist Time For Goal Achievement: 04/28/19 Potential to Achieve Goals: Good Progress towards PT goals: Progressing toward goals    Frequency    Min 3X/week      PT Plan Current plan remains appropriate    Co-evaluation              AM-PAC PT "6 Clicks" Mobility   Outcome Measure  Help needed turning from your back to your side while in a flat bed without using bedrails?: None Help needed moving from lying on your back to sitting on the side of a flat bed without using bedrails?: A Little Help needed moving to and from a bed to a chair (including a wheelchair)?: A Little Help needed standing up from a chair using your arms (e.g., wheelchair or bedside chair)?: A Little Help needed to walk in hospital room?: A Little Help needed climbing 3-5 steps with a railing? : A Lot 6 Click Score: 18    End of Session Equipment Utilized During Treatment: Oxygen Activity Tolerance: Patient tolerated treatment well;Patient limited by fatigue Patient left: in chair;with call bell/phone within reach Nurse Communication: Mobility status PT Visit Diagnosis: Unsteadiness on feet (R26.81);Other abnormalities of gait and mobility (R26.89);Muscle weakness (generalized) (M62.81)     Time: 1282-0813 PT Time Calculation (min) (ACUTE ONLY): 31 min  Charges:  $Therapeutic Exercise: 8-22 mins $Therapeutic Activity: 8-22 mins                     4:04 PM, 04/24/19 Lonell Grandchild, MPT Physical Therapist with [facility] [phone] office [phone] mobile phone w

## 2019-04-25 LAB — PROTIME-INR
INR: 1.3 — ABNORMAL HIGH (ref 0.8–1.2)
Prothrombin Time: 16.2 seconds — ABNORMAL HIGH (ref 11.4–15.2)

## 2019-04-25 LAB — RENAL FUNCTION PANEL
Albumin: 2.7 g/dL — ABNORMAL LOW (ref 3.5–5.0)
Anion gap: 14 (ref 5–15)
BUN: 30 mg/dL — ABNORMAL HIGH (ref 8–23)
CO2: 27 mmol/L (ref 22–32)
Calcium: 10 mg/dL (ref 8.9–10.3)
Chloride: 100 mmol/L (ref 98–111)
Creatinine, Ser: 7.12 mg/dL — ABNORMAL HIGH (ref 0.44–1.00)
GFR calc Af Amer: 6 mL/min — ABNORMAL LOW (ref >60)
GFR calc non Af Amer: 5 mL/min — ABNORMAL LOW (ref >60)
Glucose, Bld: 107 mg/dL — ABNORMAL HIGH (ref 70–99)
Phosphorus: 3.8 mg/dL (ref 2.5–4.6)
Potassium: 3.3 mmol/L — ABNORMAL LOW (ref 3.5–5.1)
Sodium: 141 mmol/L (ref 135–145)

## 2019-04-25 MED ORDER — ALBUTEROL SULFATE HFA 108 (90 BASE) MCG/ACT IN AERS: 2.0000 | INHALATION_SPRAY | RESPIRATORY_TRACT | 1 refills | Status: AC | PRN

## 2019-04-25 MED ORDER — FLUTICASONE PROPIONATE HFA 44 MCG/ACT IN AERO: 2.0000 | INHALATION_SPRAY | Freq: Two times a day (BID) | RESPIRATORY_TRACT | 2 refills | Status: DC

## 2019-04-25 MED ORDER — ALBUMIN HUMAN 25 % IV SOLN
12.5000 g | Freq: Once | INTRAVENOUS | Status: AC
  Administered 2019-04-25: 12.5 g via INTRAVENOUS
  Filled 2019-04-25: qty 50

## 2019-04-25 MED ORDER — MIDODRINE HCL 10 MG PO TABS: 10.0000 mg | ORAL_TABLET | ORAL | 1 refills | Status: AC

## 2019-04-25 MED ORDER — NEPRO/CARBSTEADY PO LIQD: 237.0000 mL | Freq: Every day | ORAL | 0 refills | Status: AC

## 2019-04-25 MED ORDER — WARFARIN SODIUM 5 MG PO TABS: 10.0000 mg | ORAL_TABLET | Freq: Once | ORAL | Status: DC

## 2019-04-25 MED ORDER — METOPROLOL TARTRATE 25 MG PO TABS: 25.0000 mg | ORAL_TABLET | Freq: Two times a day (BID) | ORAL | 1 refills | Status: DC

## 2019-04-25 NOTE — Progress Notes (Signed)
Physical Therapy Treatment Patient Details Name: Sabrina Beck MRN: [id number] DOB: 1941/06/30 Today's Date: 04/25/2019    History of Present Illness 78 y.o. 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  Patient had been getting progressively short of breath over 5 days.  She reports that it felt like she just could not take a deep breath in.  Patient denies any chest pain, coughing, fever, dizziness.    PT Comments    Patient agreeable for therapy and required verbals for proper hand placement during supine to sitting without use of bed rails with good return demonstrating for propping up on elbows to hands.  Patient demonstrates slightly increased endurance/distance for gait training without loss of balance while on room air with SpO2 remaining between 90-93% and after sitting up in chair SpO2 increased to 95-97% - RN notified.  Patient will benefit from continued physical therapy in hospital and recommended venue below to increase strength, balance, endurance for safe ADLs and gait.    Follow Up Recommendations  Home health PT;Supervision for mobility/OOB;Supervision - Intermittent     Equipment Recommendations  None recommended by PT    Recommendations for Other Services       Precautions / Restrictions Precautions Precautions: Fall Restrictions Weight Bearing Restrictions: No    Mobility  Bed Mobility Overal bed mobility: Needs Assistance Bed Mobility: Supine to Sit     Supine to sit: Modified independent (Device/Increase time);Supervision     General bed mobility comments: slow labored movement with good return for propping up on elbows during supine to sitting  Transfers Overall transfer level: Needs assistance Equipment used: Quad cane;Rolling walker (2  wheeled) Transfers: Sit to/from [employer] Sit to Stand: Supervision Stand pivot transfers: Supervision;Min guard       General transfer comment: slow labored movement, had to lean on armrest of chair using quad-cane, safer using RW  Ambulation/Gait Ambulation/Gait assistance: Supervision Gait Distance (Feet): 50 Feet Assistive device: Rolling walker (2 wheeled) Gait Pattern/deviations: Decreased step length - right;Decreased step length - left;Decreased stride length Gait velocity: decreased   General Gait Details: continues to lean on nearby objects for support when using quad-cane, safer using RW and demonstrateds increased endurance/distance for ambulation while on room air with SpO2 at 90-93%, no loss of balance   Stairs             Wheelchair Mobility    Modified Rankin (Stroke Patients Only)       Balance Overall balance assessment: Needs assistance Sitting-balance support: Feet supported;No upper extremity supported Sitting balance-Leahy Scale: Good Sitting balance - Comments: seated at bedside   Standing balance support: During functional activity;Single extremity supported Standing balance-Leahy Scale: Fair Standing balance comment: fair/poor using quad-cane, fair/good using RW                            Cognition Arousal/Alertness: Awake/alert Behavior During Therapy: WFL for tasks assessed/performed Overall Cognitive Status: Within Functional Limits for tasks assessed                                        Exercises General Exercises - Lower Extremity Long Arc Quad: Seated;AROM;Strengthening;Both;10 reps Hip Flexion/Marching: Seated;AROM;Strengthening;Both;10 reps Toe Raises: Seated;AROM;Strengthening;Both;10 reps Heel Raises: Seated;AROM;Strengthening;Both;10  reps    General Comments        Pertinent Vitals/Pain Pain Assessment: No/denies pain    Home Living                      Prior  Function            PT Goals (current goals can now be found in the care plan section) Acute Rehab PT Goals Patient Stated Goal: return home with family to assist Time For Goal Achievement: 04/28/19 Potential to Achieve Goals: Good Progress towards PT goals: Progressing toward goals    Frequency    Min 3X/week      PT Plan Current plan remains appropriate    Co-evaluation              AM-PAC PT "6 Clicks" Mobility   Outcome Measure  Help needed turning from your back to your side while in a flat bed without using bedrails?: None Help needed moving from lying on your back to sitting on the side of a flat bed without using bedrails?: A Little Help needed moving to and from a bed to a chair (including a wheelchair)?: A Little Help needed standing up from a chair using your arms (e.g., wheelchair or bedside chair)?: None Help needed to walk in hospital room?: A Little Help needed climbing 3-5 steps with a railing? : A Lot 6 Click Score: 19    End of Session   Activity Tolerance: Patient tolerated treatment well;Patient limited by fatigue Patient left: in chair;with call bell/phone within reach Nurse Communication: Mobility status PT Visit Diagnosis: Unsteadiness on feet (R26.81);Other abnormalities of gait and mobility (R26.89);Muscle weakness (generalized) (M62.81)     Time: 5852-7782 PT Time Calculation (min) (ACUTE ONLY): 29 min  Charges:  $Gait Training: 8-22 mins $Therapeutic Exercise: 8-22 mins                     11:44 AM, 04/25/19 Lonell Grandchild, MPT Physical Therapist with [facility] [phone] office [phone] mobile phone

## 2019-04-25 NOTE — Care Management Important Message (Signed)
Important Message  Patient Details  Name: Sabrina Beck MRN: [id number] Date of Birth: [birth year]   Medicare Important Message Given:  Yes     Tommy Medal 04/25/2019, 10:59 AM

## 2019-04-25 NOTE — TOC Transition Note (Signed)
Transition of Care [facility]) - CM/SW Discharge Note   Patient Details  Name: Sabrina Beck MRN: [id number] Date of Birth: 04-21-1941  Transition of Care [facility]) CM/SW Contact:  Ihor Gully, LCSW Phone Number: 04/25/2019, 2:17 PM   Clinical Narrative:  Vaughan Basta at [facility] notified of discharge. PCP appointment scheduled for 04/30/2019 at 1:30.  LCSW signing off.      Barriers to Discharge: No Barriers Identified   Patient Goals and CMS Choice Patient states their goals for this hospitalization and ongoing recovery are:: return home and get back to baseline. CMS Medicare.gov Compare Post Acute Care list provided to:: Patient Choice offered to / list presented to : Patient  Discharge Placement                       Discharge Plan and Services                          HH Arranged: RN, PT [facility] Agency: [place] ([place]) Date [place]: 04/24/19 Time [place]: [phone] Representative spoke with at [place]: [place] ([place]) Interventions     Readmission Risk Interventions Readmission Risk Prevention Plan 04/25/2019 04/15/2019 04/11/2019  Transportation Screening Complete Complete -  PCP or Specialist Appt within 3-5 Days - Complete Complete  HRI or Home Care Consult - Not Complete -  Social Work Consult for [place] Planning/Counseling - Complete -  Palliative Care Screening - Not Complete -  Medication Review Press photographer) Complete Complete -  PCP or Specialist appointment within 3-5 days of discharge Complete - -  [place] or Home Care Consult Complete - -  SW Recovery Care/Counseling Consult Complete - -  Palliative Care Screening Not Applicable - -  [place] Not Applicable - -  Some recent data might be hidden

## 2019-04-25 NOTE — Progress Notes (Signed)
ANTICOAGULATION CONSULT NOTE   Pharmacy Consult for Coumadin Indication: atrial fibrillation  No Known Allergies  Patient Measurements: Height: 5\' 4"  (162.6 cm) Weight: 218 lb 0.6 oz (98.9 kg) IBW/kg (Calculated) : 54.7  Vital Signs: Temp: 97.9 F (36.6 C) (09/18 0753) Temp Source: Oral (09/18 0753) BP: 113/56 (09/18 0600) Pulse Rate: 89 (09/18 0600)  Labs: Recent Labs    04/23/19 0514 04/23/19 0515 04/24/19 0420 04/25/19 0414  HGB  --  9.7* 9.3*  --   HCT  --  30.4* 29.2*  --   PLT  --  235 204  --   LABPROT 16.9*  --  15.3* 16.2*  INR 1.4*  --  1.2 1.3*  CREATININE 7.23*  --  4.81* 7.12*    Estimated Creatinine Clearance: 7.3 mL/min (A) (by C-G formula based on SCr of 7.12 mg/dL (H)).   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 She presented to the ED with shortness of breath.  9/14 Vitamin K 2.5mg  po given. 9/16  INR is down to subtherapeutic dose of 1.4 due to vitamin K- amiodarone stopped 9/17: INR 1.2: will increase back to patient's usual dose since amiodarone discontinued. 9/18 INR 1.3 continue with home regimen  Home dose PTA:  10mg  on Monday and 7.5mg  all other days.  Goal of Therapy:  INR 2-3 Monitor platelets by anticoagulation protocol: Yes  Plan:  Warfarin 10 mg po x 1 dose. Daily PT-INR Monitor for s/s of bleeding  Isac Sarna, BS [place], [place] Clinical Pharmacist Pager [phone] 04/25/2019 8:58 AM

## 2019-04-25 NOTE — Progress Notes (Signed)
Patient ID: Sabrina Beck, female   DOB: Dec 22, 1940, 78 y.o.   MRN: [id number] S: Feels much better and was able to walk in the hallway today. O:BP (!) 113/56   Pulse 89   Temp 97.9 F (36.6 C) (Oral)   Resp 18   Ht 5\' 4"  (1.626 m)   Wt 98.9 kg   SpO2 98%   BMI 37.43 kg/m   Intake/Output Summary (Last 24 hours) at 04/25/2019 1046 Last data filed at 04/25/2019 0200 Gross per 24 hour  Intake 221.98 ml  Output -  Net 221.98 ml   Intake/Output: I/O last 3 completed shifts: In: 401.7 [P.O.:120; IV Piggyback:281.7] Out: -   Intake/Output this shift:  No intake/output data recorded. Weight change: -1 kg Gen:NAD sitting in recliner CVS: no rub Resp: cta Abd: benign Ext: no edema, LAVF +T/B  Recent Labs  Lab 04/19/19 0522 04/20/19 0510 04/21/19 0453 04/22/19 0500 04/23/19 0514 04/24/19 0420 04/25/19 0414  NA 136 133* 133* 133* 135 138 141  K 4.6 4.5 4.5 3.1* 3.2* 2.9* 3.3*  CL 95* 92* 92* 90* 92* 97* 100  CO2 27 23 24 27 25 27 27   GLUCOSE 124* 117* 100* 100* 109* 101* 107*  BUN 35* 63* 85* 39* 52* 22 30*  CREATININE 4.89* 7.49* 9.19* 5.77*  5.69* 7.23* 4.81* 7.12*  ALBUMIN 3.4* 2.9* 2.5* 2.8* 2.8* 2.7* 2.7*  CALCIUM 10.0 9.7 9.4 9.3 9.9 9.6 10.0  PHOS 6.5* 8.2* 9.6* 5.8* 5.2* 3.1 3.8   Liver Function Tests: Recent Labs  Lab 04/23/19 0514 04/24/19 0420 04/25/19 0414  ALBUMIN 2.8* 2.7* 2.7*   No results for input(s): LIPASE, AMYLASE in the last 168 hours. No results for input(s): AMMONIA in the last 168 hours. CBC: Recent Labs  Lab 04/20/19 0510 04/21/19 0453 04/22/19 0500 04/23/19 0515 04/24/19 0420  WBC 23.8* 19.9* 14.4* 11.7* 7.6  NEUTROABS  --   --   --  7.7 4.4  HGB 10.8* 10.0* 9.4* 9.7* 9.3*  HCT 33.6* 31.4* 29.0* 30.4* 29.2*  MCV 111.3* 109.0* 108.2* 108.2* 108.6*  PLT 247 226 201 235 204   Cardiac Enzymes: No results for input(s): CKTOTAL, CKMB, CKMBINDEX, TROPONINI in the last 168 hours. CBG: No results for input(s): GLUCAP in the last 168  hours.  Iron Studies: No results for input(s): IRON, TIBC, TRANSFERRIN, FERRITIN in the last 72 hours. Studies/Results: No results found. . budesonide (PULMICORT) nebulizer solution  0.5 mg Nebulization BID  . Chlorhexidine Gluconate Cloth  6 each Topical Daily  . epoetin (EPOGEN/PROCRIT) injection  2,800 Units Intravenous Q M,W,F-HD  . feeding supplement (NEPRO CARB STEADY)  237 mL Oral Q1400  . gabapentin  100 mg Oral BID  . ipratropium  0.5 mg Nebulization TID  . levalbuterol  1.25 mg Nebulization TID  . mouth rinse  15 mL Mouth Rinse BID  . metoprolol tartrate  25 mg Oral BID  . midodrine  10 mg Oral Q M,W,F-HD  . pantoprazole  40 mg Oral Daily  . polyethylene glycol  17 g Oral Daily  . senna  2 tablet Oral Daily  . sevelamer carbonate  2,400 mg Oral TID WC  . sodium chloride flush  10-40 mL Intracatheter Q12H  . warfarin  10 mg Oral Once  . Warfarin - Pharmacist Dosing Inpatient   Does not apply q1800    BMET    Component Value Date/Time   NA 141 04/25/2019 0414   NA 142 06/08/2015 0735   K 3.3 (L) 04/25/2019  0414   CL 100 04/25/2019 0414   CO2 27 04/25/2019 0414   GLUCOSE 107 (H) 04/25/2019 0414   BUN 30 (H) 04/25/2019 0414   BUN 37 (H) 06/08/2015 0735   CREATININE 7.12 (H) 04/25/2019 0414   CALCIUM 10.0 04/25/2019 0414   CALCIUM 8.1 (L) 01/11/2012 1039   GFRNONAA 5 (L) 04/25/2019 0414   GFRAA 6 (L) 04/25/2019 0414   CBC    Component Value Date/Time   WBC 7.6 04/24/2019 0420   RBC 2.69 (L) 04/24/2019 0420   HGB 9.3 (L) 04/24/2019 0420   HCT 29.2 (L) 04/24/2019 0420   HCT 28.8 (L) 04/09/2019 0558   PLT 204 04/24/2019 0420   MCV 108.6 (H) 04/24/2019 0420   MCH 34.6 (H) 04/24/2019 0420   MCHC 31.8 04/24/2019 0420   RDW 13.9 04/24/2019 0420   LYMPHSABS 1.8 04/24/2019 0420   MONOABS 0.9 04/24/2019 0420   EOSABS 0.3 04/24/2019 0420   BASOSABS 0.1 04/24/2019 0420    [facility](new edw 99kg[facility]HD [facility]  2K/2.5CaTime 3.5 hrsHeparin none. AccessLAVFBFR 400DFR 600  Hectoral 9.48mcg IV/HD Epogen 2,800Units IV/HD   Assessment/Plan: 1. ID-finished 8 days of antibiotics during hospitalization for presumed HCAP and now with rising WBC and concern for colitis.  Now on zosyn per primary svc 2. A Fib with RVR-rate controlled with metoprolol and bp improved.  Coumadin per primary svc.  Cardiology had been following.  3. ESRD- She has had dailyHDwith improvement of her pulmonary edema. Off yesterday and will plan on HD todayto keep on schedule. Willcontinue withmidodrine prior to HD to help maintain BP and help with UFas well as IV albumin. 4. Hypertension/volume- hasimproved with daily dialysis.Plan for uf as bp tolerates and weigh standing up if able 5. Anemia- Cont with ESA and follow. Hgbcontinues to drop. Guaiac stools and follow h/h. 1. Will check cbc prior to HD today. 6. Metabolic bone diseasewith hypercalcemia- hectoral on hold and Ca has improved. Her iPTH was 154 so likely too high of a dose of vit D. F/u with outpatient HD unit and cont to hold vit D for now.  7. Nutrition- renal diet 8. Disposition- Agree with partial code status.  Pt with significant debilitation and will need HHC with ongoing PT/OT.  Possible discharge today after HD per nsg staff  Donetta Potts, MD [facility] [phone]

## 2019-04-25 NOTE — Discharge Summary (Signed)
Physician Discharge Summary  Sabrina Beck NFA:[id number] DOB: 12/01/1940 DOA: 04/08/2019  PCP: Redmond School, MD Cardiologist: Croituru  Admit date: 04/08/2019 Discharge date: 04/25/2019  Admitted From:  Home  Disposition: Home with [facility] services   Recommendations for Outpatient Follow-up:  1. Follow up with PCP in 1 weeks 2. Follow up with cardiology in 2 weeks 3. Resume regular hemodialysis schedule.   Home Health:  PT OT RN SW AIDE  Discharge Condition: STABLE   CODE STATUS: PARTIAL  In the event of cardiac or respiratory ARREST: Initiate Code Blue, Call Rapid Response Yes   In the event of cardiac or respiratory ARREST: Perform CPR No   In the event of cardiac or respiratory ARREST: Perform Intubation/Mechanical Ventilation Yes   In the event of cardiac or respiratory ARREST: Use NIPPV/BiPAp only if indicated Yes   In the event of cardiac or respiratory ARREST: Administer ACLS medications if indicated No   In the event of cardiac or respiratory ARREST: Perform Defibrillation or Cardioversion if indicated No     Brief Hospitalization Summary: Please see all hospital notes, images, labs for full details of the hospitalization. Brief History: 78 y.o.femalewith medical history significant ofend-stage renal disease, permanent atrial fibrillation on chronic anticoagulation with Coumadin, chronicsystolic anddiastolic CHF, hypertension, GERD, presents to the hospital with chief complaint of significant shortness of breathx 5 days.She reports that it felt like she just could not take a deep breath in. Patient deniedany chest pain, coughing, fever, dizziness, n/v/d.The patient was started on IV vanco and cefepime and treated for PNA. Her abx were de-escalated.She overall improved, but she developed Afib with RVR with soft BPs. Cardiology was consulted and pt was started on IV amio and esmolol. Her esmolol was weaned off as she improved. Unfortunately, her WBC increased up to 23K  on 04/19/19 and she subsequently became hypotensive again. Vanco and zosyn were restarted.  ED course Chemistry shows a glucose of 140, BUN of 45, creatinine of 9.98, and GFR of 3. Patient has a BNP of 344. Initial troponin is 20. Hematology reveals no leukocytosis, but a macrocytic anemia with a hemoglobin of 11. Chest x-ray was done that shows enlargement of cardiac silhouette. Right lower lobe consolidation consistent with pneumonia. Blood cultures drawn. Lactic acid 1.6. Troponin stable at 22. Second lactic acid 1.3.  On 04/19/19, the patient's WBC increased, but she remained afebrile.  Subsequently, pt became hypotensive again. Blood cultures x 2 sets were repeated and pt was restarted on zosyn and vanco empirically.Her BPs became soft again, and she remained on amiodarone and metoprolol dose was decreased. GI was consulted for colitis.  CT chest showed bibasilar consolidation.  Femoral central line was discontinued and right IJ PICC line was placed.  Fortunately, repeat blood cultures remained neg. Vancomycin was discontinued and zosyn continued.    Assessment/Plan: Acute hypoxic respiratory failure with hypoxia due tolobar pneumoniaas well as pulmonary edema -04/08/19--Patient was admitted to stepdown and placed on supplemental oxygen.  -weaned to RA, but new oxygen requirement again on 9/12 -Nephrologyfollow up appreciated -Continuepulmicort to 0.5 mg bid during hospitalization -pt endorsed some indiscretion with fluidsat home -1/16--Echo--EF 45%, mid inferior septal HK, PASP 21, mild TR/MR -on 04/19/19--WBC started trending back up, restarted antibiotics.   Sepsis - resolved now -due to HCAP and colitis -d/c vanco -continue zosyn -04/19/19--procalcitonin3.14>>>4.02 -04/21/19 BPs hypotensive -04/22/19 BP slowly improving with decreasing WBC  HCAP/Lobar Pneumonia -04/19/19 WBC went up -04/19/19--procalcitonin3.14>>>4.02 -04/19/19 blood cultures x 2 sets--neg to  date -continue zosyn -9/13/20CT chest--collapse,  consolidation in bilateral LL, small bilateral pleural effusion, ULN mediastinal LNs  IV infiltration right forearm - IMPROVING.  Continue warm compresses and appreciate wound care consultation and recommendations.  This seems to be improving.    Colitis - TREATED  -suspected ischemic colitis per GI  -04/19/19--CT abd/pelvis--segmental thickening+adjacent percolonic hazy mesenteric stranding -consult GI--appreciated-->manage medically -continue Zosynthru 9/18.  WBC normalized.    Atrial fibrillation, permanent, with RVR -Due to fluid overload,pneumonia, hypoxia -she was placed onesmololinfusionby cardiology-->weaned off 9/11 -restartedmetoprolol--increase to 37.5 mg bid>>>decrease to 25 bid - Coumadin per pharmacy, resume home warfarin dosing and monitoring.  HH to check PT/INR on Monday 9/21 -rate control difficult due to low BPsfor nearly one week-->rate now improving -Appreciate cardiology consultation -amiodarone drip started 04/17/19-->continue for now in setting of new infectious process and high risk of RVR -DC amiodarone infusion.  RATE CONTROLLED ON METOPROLOL 25 mg BID.  Follow up with cardiologist.   Coagulopathy -INR trending up even without warfarin -due to sepsis -resume home warfarin dosing.  HHRN to check PT/INR on 9/21.   Poor IV access -femoral CVL placed 04/17/19-->removed 04/20/19 -R-IJ PICC line placed 04/20/19 - DC prior to discharge.   chronicsystolic and diastolicCHF -Given the decrease in EF, discontinueddiltiazem in Jan 2020 -08/22/18--Echo--EF 45%, mid inferior septal HK, PASP 21, mild TR/MR -continue metoprolol tartrate 25 mg bid -wt down from 106 to97.7Kg. -now appears euvolemic  ESRD -last HD 04/15/19--UF only -HD on 9/9and9/11 -appreciate nephrology -midodrine on HD to help maintain BP  Bronchospasm -Continue Pulmicort to 0.5 mg bid -prednisone was discontinued -Treated  withatrovent/Xopenexneb -improved  Hypercalcemia -Hectorol on hold -per nephrology  Hypertension -Blood pressuresoft again -decrease metoprolol  Anemia of end-stage renal disease -Hemoglobin stable, no bleeding -continue ESA  GERD -Continue home PPI   Goals of Care -consulted palliative medicine for goals of care discussion -no CPR, but yes to intubation  Disposition Plan: remain in SDU Family Communication:son updated at bedside 04/22/19  Consultants:renal, cardiology  Code Status: Partial   DVT Prophylaxis:warfarin  Procedures: Femoral CVL--9/10>>>>9/13 R-IJ PICC 9/13>>>  Antibiotics: vanc 9/2 Ceftriaxone 9/1 Cefepime 9/2>>>9/5 Amox/clav 9/6>>>9/10 vanco 9/12>>>9/15 Zosyn 9/12>>>9/18  Discharge Diagnoses:  Active Problems:   Long term current use of anticoagulant therapy   Mixed hyperlipidemia   Acute respiratory failure with hypoxia (HCC)   Lobar pneumonia (HCC)   Permanent atrial fibrillation   CAP (community acquired pneumonia)   Sepsis due to undetermined organism ([place])   Coagulopathy ([place])   Ischemic colitis [facility])   Discharge Instructions:  Allergies as of 04/25/2019   No Known Allergies     Medication List    TAKE these medications   acetaminophen 500 MG tablet Commonly known as: TYLENOL Take 1,000 mg by mouth every 6 (six) hours as needed for moderate pain.   albuterol 108 (90 Base) MCG/ACT inhaler Commonly known as: VENTOLIN HFA Inhale 2 puffs into the lungs every 4 (four) hours as needed for wheezing or shortness of breath (cough, shortness of breath or wheezing.).   feeding supplement (NEPRO CARB STEADY) Liqd Take 237 mLs by mouth daily at 2 PM.   fluticasone 44 MCG/ACT inhaler Commonly known as: FLOVENT HFA Inhale 2 puffs into the lungs 2 (two) times daily.   gabapentin 100 MG capsule Commonly known as: NEURONTIN Take 100 mg by mouth 2 (two) times daily.   metoprolol tartrate 25 MG tablet Commonly  known as: LOPRESSOR Take 1 tablet (25 mg total) by mouth 2 (two) times daily. What changed:   medication strength  how much to take   midodrine 10 MG tablet Commonly known as: PROAMATINE Take 1 tablet (10 mg total) by mouth every Monday, Wednesday, and Friday with hemodialysis. Start taking on: April 28, 2019   multivitamin Tabs tablet Take 1 tablet by mouth every evening.   Omega-3 Fish Oil 300 MG Caps Take 300 mg by mouth 2 (two) times daily.   omeprazole 20 MG capsule Commonly known as: PRILOSEC Take 20 mg by mouth every morning.   PRESERVISION AREDS 2 PO Take 1 tablet by mouth 2 (two) times daily.   Renvela 800 MG tablet Generic drug: sevelamer carbonate Take 2,400 mg by mouth 3 (three) times daily with meals. Takes 1 tablet with snacks   Systane Balance 0.6 % Soln Generic drug: Propylene Glycol Place 1 drop into both eyes 3 (three) times daily as needed (for burning or dry eyes).   Vitamin D 50 MCG (2000 UT) Caps Take 2,000 Units by mouth every evening.   warfarin 5 MG tablet Commonly known as: COUMADIN Take as directed. If you are unsure how to take this medication, talk to your nurse or doctor. Original instructions: Take 1.5 to 2 tablets by mouth daily as directed by coumadin clinic What changed:   how much to take  how to take this  when to take this  additional instructions      Follow-up Information    Redmond School, MD. Schedule an appointment as soon as possible for a visit in 1 week(s).   Specialty: Internal Medicine Why: Hospital Follow Up  Contact information: [address] [place] [address] [phone]        Croitoru, Dani Gobble, MD. Schedule an appointment as soon as possible for a visit in 2 week(s).   Specialty: Cardiology Contact information: [address] [place] [address] [phone]        Ozella Rocks, MD .   Specialty: Surgery Contact information: [facility] [address] [address] [facility]  [id number] [phone]          No Known Allergies Allergies as of 04/25/2019   No Known Allergies     Medication List    TAKE these medications   acetaminophen 500 MG tablet Commonly known as: TYLENOL Take 1,000 mg by mouth every 6 (six) hours as needed for moderate pain.   albuterol 108 (90 Base) MCG/ACT inhaler Commonly known as: VENTOLIN HFA Inhale 2 puffs into the lungs every 4 (four) hours as needed for wheezing or shortness of breath (cough, shortness of breath or wheezing.).   feeding supplement (NEPRO CARB STEADY) Liqd Take 237 mLs by mouth daily at 2 PM.   fluticasone 44 MCG/ACT inhaler Commonly known as: FLOVENT HFA Inhale 2 puffs into the lungs 2 (two) times daily.   gabapentin 100 MG capsule Commonly known as: NEURONTIN Take 100 mg by mouth 2 (two) times daily.   metoprolol tartrate 25 MG tablet Commonly known as: LOPRESSOR Take 1 tablet (25 mg total) by mouth 2 (two) times daily. What changed:   medication strength  how much to take   midodrine 10 MG tablet Commonly known as: PROAMATINE Take 1 tablet (10 mg total) by mouth every Monday, Wednesday, and Friday with hemodialysis. Start taking on: April 28, 2019   multivitamin Tabs tablet Take 1 tablet by mouth every evening.   Omega-3 Fish Oil 300 MG Caps Take 300 mg by mouth 2 (two) times daily.   omeprazole 20 MG capsule Commonly known as: PRILOSEC Take 20 mg by mouth every morning.  PRESERVISION AREDS 2 PO Take 1 tablet by mouth 2 (two) times daily.   Renvela 800 MG tablet Generic drug: sevelamer carbonate Take 2,400 mg by mouth 3 (three) times daily with meals. Takes 1 tablet with snacks   Systane Balance 0.6 % Soln Generic drug: Propylene Glycol Place 1 drop into both eyes 3 (three) times daily as needed (for burning or dry eyes).   Vitamin D 50 MCG (2000 UT) Caps Take 2,000 Units by mouth every evening.   warfarin 5 MG tablet Commonly known as: COUMADIN Take as directed. If  you are unsure how to take this medication, talk to your nurse or doctor. Original instructions: Take 1.5 to 2 tablets by mouth daily as directed by coumadin clinic What changed:   how much to take  how to take this  when to take this  additional instructions       Procedures/Studies: Ct Abdomen Pelvis Wo Contrast  Result Date: 04/19/2019 CLINICAL DATA:  Abdominal pain, gastroenteritis or colitis suspected EXAM: CT ABDOMEN AND PELVIS WITHOUT CONTRAST TECHNIQUE: Multidetector CT imaging of the abdomen and pelvis was performed following the standard protocol without IV contrast. COMPARISON:  None. FINDINGS: Lower chest: Multifocal consolidative opacities are present in the lung bases. More masslike consolidation is present in the left lower lobe. Diffuse airways thickening is noted in the bases. Dependent atelectatic changes noted posteriorly. Trace effusions are present as well. There is cardiomegaly with biatrial enlargement. Atherosclerotic calcification of the coronary arteries. Atherosclerotic calcifications are present upon the aortic leaflets and within the mitral valve annulus. Hepatobiliary: No focal liver abnormality is seen. Patient is post cholecystectomy. Slight prominence of the biliary tree likely related to reservoir effect. No calcified intraductal gallstones. Pancreas: Unremarkable. No pancreatic ductal dilatation or surrounding inflammatory changes. Spleen: Normal in size without focal abnormality. Adrenals/Urinary Tract: Normal adrenal glands. Severe atrophy of both kidneys with multiple vascular calcifications in the renal hila and few lobular subcentimeter renal lesions likely reflecting cysts. These are too small to fully characterize on CT imaging but statistically likely benign. Stomach/Bowel: Distal esophagus, stomach and duodenal sweep are unremarkable. No bowel wall thickening or dilatation. No evidence of obstruction. A normal appendix is visualized. There is segmental  thickening and adjacent pericolonic hazy mesenteric stranding involving the transverse colon and proximal descending colon. There are numerous pancolonic diverticula however inflammation is not appear centered upon a focal culprit diverticulum. No resulting obstruction. Vascular/Lymphatic: Severe atherosclerotic calcification. Evaluation of vascular patency is limited by the absence of intravenous contrast. No suspicious or enlarged lymph nodes in the included lymphatic chains. Reproductive: Uterus is surgically absent. No concerning adnexal lesions. Other: No abdominopelvic free fluid or free gas. No bowel containing hernias. Fat containing ventral/umbilical hernia. Anterior abdominal wall laxity. Small amount of soft tissue stranding/edema along the left lateral abdominal wall. Musculoskeletal: Multilevel degenerative changes are present in the imaged portions of the spine. IMPRESSION: 1. Segmental thickening and adjacent pericolonic hazy mesenteric stranding involving the transverse colon and proximal descending colon, consistent with colitis, which could be infectious, inflammatory or potentially vascular in etiology. Evaluation of the vasculature is limited in the absence of contrast media. 2. Multifocal areas of airspace opacity in both lower lobes. More masslike consolidation is present in the left lung base. Recommend reimaging following therapeutic intervention to ensure resolution. 3. Extensive pancolonic diverticulosis without evidence of acute diverticulitis. 4. Bilateral renal atrophy. Scattered cystic lesions in both kidneys, too small characterize on CT imaging. 5. Small fat containing ventral hernia. 6. Aortic Atherosclerosis (  ICD10-I70.0). Electronically Signed   By: Lovena Le M.D.   On: 04/19/2019 22:36   Dg Chest 2 View  Result Date: 04/10/2019 CLINICAL DATA:  Shortness of breath. EXAM: CHEST - 2 VIEW COMPARISON:  04/08/2019 and 08/18/2018 FINDINGS: Cardiac size is within normal limits.  Aortic atherosclerosis. New pulmonary vascular congestion. New bilateral perihilar infiltrates. Persistent hazy infiltrate at the right lung base posteriorly. Minimal blunting of the posterior costophrenic angles. No acute bone abnormality. IMPRESSION: New bilateral perihilar infiltrates. Persistent infiltrate at the right lung base. This could represent pulmonary edema or multifocal progressive pneumonia. Aortic Atherosclerosis (ICD10-I70.0). Electronically Signed   By: Lorriane Shire M.D.   On: 04/10/2019 11:04   Ct Head Wo Contrast  Result Date: 04/11/2019 CLINICAL DATA:  Transient ischemic attack.  Slurred speech. EXAM: CT HEAD WITHOUT CONTRAST TECHNIQUE: Contiguous axial images were obtained from the base of the skull through the vertex without intravenous contrast. COMPARISON:  CT scan of September 05, 2014. FINDINGS: Brain: No evidence of acute infarction, hemorrhage, hydrocephalus, extra-axial collection or mass lesion/mass effect. Vascular: No hyperdense vessel or unexpected calcification. Skull: Normal. Negative for fracture or focal lesion. Sinuses/Orbits: No acute finding. Other: None. IMPRESSION: No acute intracranial abnormality seen. Electronically Signed   By: Marijo Conception M.D.   On: 04/11/2019 12:08   Ct Chest Wo Contrast  Result Date: 04/20/2019 CLINICAL DATA:  Masslike opacity seen in the lower lobes on abdomen CT yesterday. EXAM: CT CHEST WITHOUT CONTRAST TECHNIQUE: Multidetector CT imaging of the chest was performed following the standard protocol without IV contrast. COMPARISON:  Abdomen CT 04/19/2019 FINDINGS: Cardiovascular: Heart is enlarged. Coronary artery calcification is evident. Atherosclerotic calcification is noted in the wall of the thoracic aorta. Right IJ central line tip is in the distal SVC. Mediastinum/Nodes: Upper normal to borderline enlarged precarinal and subcarinal lymph nodes are identified. Precarinal 10 mm node identified on 52/2 in there is 11 mm subcarinal node  on 63/2. No evidence for gross hilar lymphadenopathy although assessment is limited by the lack of intravenous contrast on today's study. The esophagus has normal imaging features. There is no axillary lymphadenopathy. Lungs/Pleura: Collapse/consolidative changes seen in both lower lobes tracking from the inferior hilar regions posteriorly and inferiorly into the lung bases. Probable atelectasis anterior right upper lobe. No overt pulmonary edema. Small bilateral pleural effusions noted. Upper Abdomen: Unremarkable. Musculoskeletal: No worrisome lytic or sclerotic osseous abnormality. IMPRESSION: Bilateral lower lobe collapse/consolidation compatible with pneumonia. Small bilateral pleural effusions. Upper normal to borderline enlarged central mediastinal lymph nodes, presumably reactive. Follow-up CT chest in 3 months could be used to ensure stability. Electronically Signed   By: Misty Stanley M.D.   On: 04/20/2019 17:30   Dg Chest Port 1 View  Result Date: 04/20/2019 CLINICAL DATA:  Central line placement EXAM: PORTABLE CHEST 1 VIEW COMPARISON:  04/15/2019 FINDINGS: Limited rotated portable exam to the left. Right IJ central line tip projects over the spine related to the rotation with the tip in the mid right atrium level. This can be retracted 3.5 cm toi the SVC RA junction. Once completed, consider repeat chest x-ray without rotation to confirm tip position. Stable cardiomegaly and vascular congestion. Lower lung volumes with increased bibasilar atelectasis/airspace disease worse in the left lower lobe obscuring the left hemidiaphragm. Small left effusion not excluded. No pneumothorax. Aorta atherosclerotic. IMPRESSION: Right IJ central line tip as detailed above. See above comment and recommendation. Cardiomegaly with vascular congestion Lower lung volumes with increased basilar atelectasis/consolidation worse on the left.  Electronically Signed   By: Jerilynn Mages.  Shick M.D.   On: 04/20/2019 10:58   Dg Chest Port  1 View  Result Date: 04/15/2019 CLINICAL DATA:  Shortness of breath. EXAM: PORTABLE CHEST 1 VIEW COMPARISON:  Radiographs of April 13, 2019. FINDINGS: Stable cardiomegaly. No pneumothorax or definite pleural effusion is noted. Stable bilateral lung opacities are noted concerning for edema or inflammation. Bony thorax is unremarkable. IMPRESSION: Stable bilateral lung opacities as described above. Electronically Signed   By: Marijo Conception M.D.   On: 04/15/2019 07:31   Dg Chest Port 1 View  Result Date: 04/13/2019 CLINICAL DATA:  Shortness of breath and hypoxia in a patient with a history of end-stage renal disease. EXAM: PORTABLE CHEST 1 VIEW COMPARISON:  Single-view of the chest 04/11/2019. PA and lateral chest 04/10/2019. FINDINGS: Bilateral airspace disease has improved but there is persistent focal airspace opacity in the right mid and lower lung zones. There is cardiomegaly. No pneumothorax. Small bilateral pleural effusions. IMPRESSION: Persistent but improved bilateral airspace disease compatible with decreased pulmonary edema. More focal airspace opacity in the right midlung could be due to edema or pneumonia. Electronically Signed   By: Inge Rise M.D.   On: 04/13/2019 09:08   Dg Chest Port 1 View  Result Date: 04/11/2019 CLINICAL DATA:  Increasing shortness of breath following dialysis. EXAM: PORTABLE CHEST 1 VIEW COMPARISON:  04/10/2019. FINDINGS: Poor inspiration. Progressive enlargement of the cardiac silhouette with increased prominence of the pulmonary vasculature interstitial markings. Progressive bilateral airspace opacities, greater on the right. No definite pleural fluid seen. Unremarkable bones. IMPRESSION: 1. Progressive cardiomegaly and changes of congestive heart failure. 2. Progressive bilateral alveolar edema or pneumonia, greater on the right. Electronically Signed   By: Claudie Revering M.D.   On: 04/11/2019 19:54   Dg Chest Port 1 View  Result Date: 04/08/2019 CLINICAL  DATA:  Difficulty breathing for past few days, dyspnea on exertion, denies fever, history atrial fibrillation, heart failure, end-stage renal disease on dialysis, systemic hypertension, uterine cancer EXAM: PORTABLE CHEST 1 VIEW COMPARISON:  Portable exam 1645 hours compared to 08/18/2018 FINDINGS: Enlargement of cardiac silhouette. Mediastinal contours and pulmonary vascularity normal. Atherosclerotic calcification aorta. RIGHT lower lobe consolidation consistent with pneumonia. Remaining lungs clear. No pleural effusion or pneumothorax. Bones demineralized. IMPRESSION: Enlargement of cardiac silhouette. RIGHT lower lobe consolidation consistent with pneumonia. Electronically Signed   By: Lavonia Dana M.D.   On: 04/08/2019 17:18      Subjective: Pt without complaints.  Eating and drinking well.  No distress.  Ambulating with PT. Agreeable to going home.   Discharge Exam: Vitals:   04/25/19 1245 04/25/19 1300  BP: (!) 108/59 (!) 117/58  Pulse:  (!) 108  Resp:  19  Temp:    SpO2:  96%   Vitals:   04/25/19 1130 04/25/19 1200 04/25/19 1245 04/25/19 1300  BP: (!) 110/52 107/81 (!) 108/59 (!) 117/58  Pulse: 92 95  (!) 108  Resp:  18  19  Temp:      TempSrc:      SpO2:  99%  96%  Weight:      Height:        General:  Pt is alert, follows commands appropriately, not in acute distress, oriented x 3.   HEENT: No icterus, No thrush, No neck mass, [place]/AT  Cardiovascular: irregular, normal S1/S2, no rubs, no gallops  Respiratory: BBS with fine basilar crackles.   Abdomen: Soft/+BS, non tender, non distended, no guarding  Extremities: improving IV  infiltration right forearm, less edema, no drainage, no cyanosis or clubbing.   Neurological: nonfocal exam.    The results of significant diagnostics from this hospitalization (including imaging, microbiology, ancillary and laboratory) are listed below for reference.     Microbiology: Recent Results (from the past 240 hour(s))  Culture,  blood (Routine X 2) w Reflex to ID Panel     Status: None   Collection Time: 04/19/19  4:21 PM   Specimen: BLOOD RIGHT HAND  Result Value Ref Range Status   Specimen Description   Final    BLOOD RIGHT HAND BOTTLES DRAWN AEROBIC AND ANAEROBIC   Special Requests Blood Culture adequate volume  Final   Culture   Final    NO GROWTH 5 DAYS Performed at [facility], [address] [place], [place] [address]    Report Status 04/24/2019 FINAL  Final  Culture, blood (Routine X 2) w Reflex to ID Panel     Status: None   Collection Time: 04/19/19  4:29 PM   Specimen: Leg; Blood  Result Value Ref Range Status   Specimen Description LEG BOTTLES DRAWN AEROBIC AND ANAEROBIC  Final   Special Requests Blood Culture adequate volume  Final   Culture   Final    NO GROWTH 5 DAYS Performed at [facility], [address], [place], [place] [address]    Report Status 04/24/2019 FINAL  Final     Labs: BNP (last 3 results) Recent Labs    04/08/19 1600  BNP 035.0*   Basic Metabolic Panel: Recent Labs  Lab 04/21/19 0453 04/22/19 0500 04/23/19 0514 04/23/19 0515 04/24/19 0420 04/25/19 0414  NA 133* 133* 135  --  138 141  K 4.5 3.1* 3.2*  --  2.9* 3.3*  CL 92* 90* 92*  --  97* 100  CO2 24 27 25   --  27 27  GLUCOSE 100* 100* 109*  --  101* 107*  BUN 85* 39* 52*  --  22 30*  CREATININE 9.19* 5.77*  5.69* 7.23*  --  4.81* 7.12*  CALCIUM 9.4 9.3 9.9  --  9.6 10.0  MG  --   --   --  2.2 2.0  --   PHOS 9.6* 5.8* 5.2*  --  3.1 3.8   Liver Function Tests: Recent Labs  Lab 04/21/19 0453 04/22/19 0500 04/23/19 0514 04/24/19 0420 04/25/19 0414  ALBUMIN 2.5* 2.8* 2.8* 2.7* 2.7*   No results for input(s): LIPASE, AMYLASE in the last 168 hours. No results for input(s): AMMONIA in the last 168 hours. CBC: Recent Labs  Lab 04/20/19 0510 04/21/19 0453 04/22/19 0500 04/23/19 0515 04/24/19 0420  WBC 23.8* 19.9* 14.4* 11.7* 7.6  NEUTROABS  --   --   --  7.7 4.4  HGB 10.8* 10.0* 9.4* 9.7* 9.3*   HCT 33.6* 31.4* 29.0* 30.4* 29.2*  MCV 111.3* 109.0* 108.2* 108.2* 108.6*  PLT 247 226 201 235 204   Cardiac Enzymes: No results for input(s): CKTOTAL, CKMB, CKMBINDEX, TROPONINI in the last 168 hours. BNP: Invalid input(s): POCBNP CBG: No results for input(s): GLUCAP in the last 168 hours. D-Dimer No results for input(s): DDIMER in the last 72 hours. Hgb A1c No results for input(s): HGBA1C in the last 72 hours. Lipid Profile No results for input(s): CHOL, HDL, LDLCALC, TRIG, CHOLHDL, LDLDIRECT in the last 72 hours. Thyroid function studies No results for input(s): TSH, T4TOTAL, T3FREE, THYROIDAB in the last 72 hours.  Invalid input(s): FREET3 Anemia work up No results for input(s):  VITAMINB12, FOLATE, FERRITIN, TIBC, IRON, RETICCTPCT in the last 72 hours. Urinalysis    Component Value Date/Time   COLORURINE YELLOW 03/03/2012 1500   APPEARANCEUR CLEAR 03/03/2012 1500   LABSPEC 1.020 03/03/2012 1500   PHURINE 6.0 03/03/2012 1500   GLUCOSEU 100 (A) 03/03/2012 1500   HGBUR TRACE (A) 03/03/2012 1500   BILIRUBINUR NEGATIVE 03/03/2012 1500   KETONESUR NEGATIVE 03/03/2012 1500   PROTEINUR 100 (A) 03/03/2012 1500   UROBILINOGEN 0.2 03/03/2012 1500   NITRITE NEGATIVE 03/03/2012 1500   LEUKOCYTESUR NEGATIVE 03/03/2012 1500   Sepsis Labs Invalid input(s): PROCALCITONIN,  WBC,  LACTICIDVEN Microbiology Recent Results (from the past 240 hour(s))  Culture, blood (Routine X 2) w Reflex to ID Panel     Status: None   Collection Time: 04/19/19  4:21 PM   Specimen: BLOOD RIGHT HAND  Result Value Ref Range Status   Specimen Description   Final    BLOOD RIGHT HAND BOTTLES DRAWN AEROBIC AND ANAEROBIC   Special Requests Blood Culture adequate volume  Final   Culture   Final    NO GROWTH 5 DAYS Performed at [facility], [address] [place], [place] [address]    Report Status 04/24/2019 FINAL  Final  Culture, blood (Routine X 2) w Reflex to ID Panel     Status: None   Collection  Time: 04/19/19  4:29 PM   Specimen: Leg; Blood  Result Value Ref Range Status   Specimen Description LEG BOTTLES DRAWN AEROBIC AND ANAEROBIC  Final   Special Requests Blood Culture adequate volume  Final   Culture   Final    NO GROWTH 5 DAYS Performed at [facility], [address], [place], [place] [address]    Report Status 04/24/2019 FINAL  Final   Time coordinating discharge:   SIGNED:  Irwin Brakeman, MD  [facility] 04/25/2019, 1:34 PM How to contact the [facility] Attending or Consulting provider Pr[place] or covering provider during after hours [place], for this patient?  1. Check the care team in [facility] and look for a) attending/consulting TRH provider listed and b) the [facility] team listed 2. Log into [email or web address] and use 's universal password to access. If you do not have the password, please contact the hospital operator. 3. Locate the [facility] provider you are looking for under [facility] and page to a number that you can be directly reached. 4. If you still have difficulty reaching the provider, please page the [facility] (Director on Call) for the Hospitalists listed on amion for assistance.

## 2019-04-26 ENCOUNTER — Encounter (HOSPITAL_COMMUNITY): Payer: Self-pay | Admitting: Nephrology

## 2019-04-27 DIAGNOSIS — J9601 Acute respiratory failure with hypoxia: Secondary | ICD-10-CM | POA: Diagnosis not present

## 2019-04-27 DIAGNOSIS — I4821 Permanent atrial fibrillation: Secondary | ICD-10-CM | POA: Diagnosis not present

## 2019-04-27 DIAGNOSIS — J181 Lobar pneumonia, unspecified organism: Secondary | ICD-10-CM | POA: Diagnosis not present

## 2019-04-27 DIAGNOSIS — K559 Vascular disorder of intestine, unspecified: Secondary | ICD-10-CM | POA: Diagnosis not present

## 2019-04-27 DIAGNOSIS — K5790 Diverticulosis of intestine, part unspecified, without perforation or abscess without bleeding: Secondary | ICD-10-CM | POA: Diagnosis not present

## 2019-04-27 DIAGNOSIS — I132 Hypertensive heart and chronic kidney disease with heart failure and with stage 5 chronic kidney disease, or end stage renal disease: Secondary | ICD-10-CM | POA: Diagnosis not present

## 2019-04-27 DIAGNOSIS — K219 Gastro-esophageal reflux disease without esophagitis: Secondary | ICD-10-CM | POA: Diagnosis not present

## 2019-04-27 DIAGNOSIS — D631 Anemia in chronic kidney disease: Secondary | ICD-10-CM | POA: Diagnosis not present

## 2019-04-27 DIAGNOSIS — T80818D Extravasation of other vesicant agent, subsequent encounter: Secondary | ICD-10-CM | POA: Diagnosis not present

## 2019-04-27 DIAGNOSIS — N186 End stage renal disease: Secondary | ICD-10-CM | POA: Diagnosis not present

## 2019-04-27 DIAGNOSIS — E782 Mixed hyperlipidemia: Secondary | ICD-10-CM | POA: Diagnosis not present

## 2019-04-27 DIAGNOSIS — I5042 Chronic combined systolic (congestive) and diastolic (congestive) heart failure: Secondary | ICD-10-CM | POA: Diagnosis not present

## 2019-04-28 ENCOUNTER — Other Ambulatory Visit: Payer: Self-pay | Admitting: *Deleted

## 2019-04-28 ENCOUNTER — Other Ambulatory Visit: Payer: Self-pay

## 2019-04-28 ENCOUNTER — Encounter: Payer: Self-pay | Admitting: *Deleted

## 2019-04-28 ENCOUNTER — Telehealth: Payer: Self-pay | Admitting: *Deleted

## 2019-04-28 ENCOUNTER — Ambulatory Visit (INDEPENDENT_AMBULATORY_CARE_PROVIDER_SITE_OTHER): Payer: Medicare HMO | Admitting: *Deleted

## 2019-04-28 DIAGNOSIS — E782 Mixed hyperlipidemia: Secondary | ICD-10-CM | POA: Diagnosis not present

## 2019-04-28 DIAGNOSIS — Z5181 Encounter for therapeutic drug level monitoring: Secondary | ICD-10-CM | POA: Diagnosis not present

## 2019-04-28 DIAGNOSIS — N186 End stage renal disease: Secondary | ICD-10-CM | POA: Diagnosis not present

## 2019-04-28 DIAGNOSIS — D631 Anemia in chronic kidney disease: Secondary | ICD-10-CM | POA: Diagnosis not present

## 2019-04-28 DIAGNOSIS — I5042 Chronic combined systolic (congestive) and diastolic (congestive) heart failure: Secondary | ICD-10-CM | POA: Diagnosis not present

## 2019-04-28 DIAGNOSIS — N2581 Secondary hyperparathyroidism of renal origin: Secondary | ICD-10-CM | POA: Diagnosis not present

## 2019-04-28 DIAGNOSIS — J9601 Acute respiratory failure with hypoxia: Secondary | ICD-10-CM | POA: Diagnosis not present

## 2019-04-28 DIAGNOSIS — J181 Lobar pneumonia, unspecified organism: Secondary | ICD-10-CM

## 2019-04-28 DIAGNOSIS — I132 Hypertensive heart and chronic kidney disease with heart failure and with stage 5 chronic kidney disease, or end stage renal disease: Secondary | ICD-10-CM | POA: Diagnosis not present

## 2019-04-28 DIAGNOSIS — I482 Chronic atrial fibrillation, unspecified: Secondary | ICD-10-CM

## 2019-04-28 DIAGNOSIS — T80818D Extravasation of other vesicant agent, subsequent encounter: Secondary | ICD-10-CM | POA: Diagnosis not present

## 2019-04-28 DIAGNOSIS — I4821 Permanent atrial fibrillation: Secondary | ICD-10-CM | POA: Diagnosis not present

## 2019-04-28 DIAGNOSIS — D509 Iron deficiency anemia, unspecified: Secondary | ICD-10-CM | POA: Diagnosis not present

## 2019-04-28 DIAGNOSIS — K219 Gastro-esophageal reflux disease without esophagitis: Secondary | ICD-10-CM | POA: Diagnosis not present

## 2019-04-28 DIAGNOSIS — Z992 Dependence on renal dialysis: Secondary | ICD-10-CM | POA: Diagnosis not present

## 2019-04-28 LAB — POCT INR: INR: 1.6 — AB (ref 2.0–3.0)

## 2019-04-28 NOTE — Telephone Encounter (Signed)
Done.  See coumadin note. 

## 2019-04-28 NOTE — Patient Instructions (Signed)
Take coumadin 2 tablets tonight and tomorrow night then resume 1.5 tablets daily except 2 tablets on Mondays. Recheck on 05/02/19 by [facility] on M,W,F Order given to Inocencio Homes [facility]

## 2019-04-28 NOTE — Patient Outreach (Addendum)
[facility]) Care Management  04/28/2019  RAMATOULAYE PACK 1941-07-04 [id number]   Telephone screen for Complex CM Referral  Referral Date: 04/28/19 Referral Source: [facility] hospital liaison, [place] Reason for consult Post hospital follow up   Diagnoses of Heart Failure  COPD/ Pneumonia  Date of Admission: 04/08/19 Diagnosis: acute hypoxic respiratory failure with hypoxia due to lobar pneumonia as well as pulmonary edema, sepsis due to HCAP and Colitis, Afib with RVR Date of Discharge: 04/25/19 Facility[facility]  Insurance: [place] [employer]   Transition of care services noted to be completed by primary care MD office staff [facility] [employer]   Patient returned a call to [facility] RN CM Patient is able to verify HIPAA, DOB and Address Reviewed and addressed complex CM referral after her d/c from [facility] and that CM would complete some Transition of care services  [facility] RN CM discussed that the primary care office staff may also call her and the Home health RN may ask similar questions She voiced understanding   Consent: [facility] RN CM reviewed [facility] services with patient. Patient gave verbal consent for [facility] telephonic RN CM services. Advised patient that other post discharge calls may occur to assess how the patient is doing following the recent hospitalization. Patient voiced understanding and was appreciative of f/u call. Designated Party release form dated 06/04/15 indicates all [facility] practice staff can speak with Sabrina Beck and Sabrina Beck, her sons, per pt permission  She confirms this during this call   [facility] RN CM completed Epic Transition of care assessment in this call screening    Assessment needs identified   Need Nepro Sabrina Beck reports she did not purchase the Nepro the cost was $100 and she is on a fixed income. Diet ---She confirms she is following a low sodium heart health diet and per her dialysis staff  she is following a fluid restriction recommendation of not over 32 ounces of fluid   She reports her right arm is better after an iv infiltration She states it is only sore and the bruising is resolve. She reports the home health RN examined and dressed the site during the last 2 home visits  Hospital colitis symptoms are reported to be better    CHF She confirms she has scales but forgot to weigh 04/28/19 am She is able to tell RN CM she knows if she gains more than 2 lbs during the night or retains fluid she is to call her MD. [facility] RN CM went over the CHF action plan listed in her after summary discharge sheets and reminded her to review them She states her Cardiologist has mentioned them. 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 She reports being tired today after her dialysis session. She is independent/assist with her ADLs and is needing assist with iADLs at this time. She reports she has transportation to get to medical appointments and her sons assist her to get to appointments She lives in a one story home    Conditions: acute hypoxic respiratory failure with hypoxia due to lobar pneumonia as well as pulmonary edema, sepsis due to HCAP  and Colitis, AFIB with RVR, end-stage renal disease, permanent atrial fibrillation on chronic anticoagulation with Coumadin, chronicsystolic anddiastolic CHF, HTN, GERD, mixed HLD, Pt reports she ws a former smoker macular degenerative disease, CKD stage 5, ESRD, malignant neoplasm of uterus, coagulopathy, anemia  Covid negative on 04/08/19  Falls Denied by pt but 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  DME: cane , eyeglasses, scales  Medications: She denies concerns with  taking medications as prescribed, affording medications, side effects of medications and questions about medications  Appointments: She is scheduled to see Dr Gerarda Fraction on 04/30/19  She has to call to make a hospital f/u with Dr Recardo Evangelist  CM discussed the importance of f/u with cardiologist after hospitalization to be updated on any changes in cardiology tx plan She voiced understanding   Advance directives: Denies need for assist with advance directives She does not have written advance directives but reports she has spoke with her sons about her wishes. [facility] RN CM discussed advance directives and offered [facility] SW assistance but she at this time is not interested She informs CM she will contact CM when and if she is ready     Plan: [facility] RN CM follow up with Sabrina Beck within 7-10 business days to continue complex CM services  Pt encouraged to return a call to [facility] CM prn  [facility] RN CM sent a successful outreach letter as discussed with [facility] brochure enclosed for review  Routed note to MDs  Joelene Millin L. Lavina Hamman, RN, BSN, [place] Management Care Coordinator Direct Number [phone] Mobile number [phone]  Main THN number [phone] Fax number [phone]

## 2019-04-28 NOTE — Telephone Encounter (Signed)
[place] [phone]  INR 1.6

## 2019-04-28 NOTE — Patient Outreach (Signed)
[facility]) Care Management  04/28/2019  Sabrina Beck Jan 14, 1941 [id number]   Telephone screen for Complex CM Referral   Referral Date: 04/28/19 Referral Source: [facility] hospital liaison, [place] Reason for consult Post hospital follow up   Diagnoses of Heart Failure  COPD/ Pneumonia  Date of Admission: 04/08/19 Diagnosis: acute hypoxic respiratory failure with hypoxia due to lobar pneumonia as well as pulmonary edema, sepsis due to HCAP and Colitis, afib with RVR Date of Discharge: 04/25/19 Facility[facility]  Insurance: [place] medicare HMO/PPO   Transition of care services noted to be completed by primary care MD office staff [facility] [employer]   Outreach attempt # 1 Unsuccessful  A female answered an reported the home health RN arrived about 15 minutes ago, is working with the patient and attempting to "call in her coumadin level" He took a message to include [facility] RN CM name and contact number for a return call   Social: pending  Conditions: acute hypoxic respiratory failure with hypoxia due to lobar pneumonia as well as pulmonary edema, sepsis due to HCAP and Colitis, AFIB with RVR, end-stage renal disease, permanent atrial fibrillation on chronic anticoagulation with Coumadin, chronicsystolic anddiastolic CHF, HTN, GERD, mixd HLD  Medications: pending   Appointments: pending  Advance directives: pending  Advised patient that other post discharge calls may occur to assess how the patient is doing following the recent hospitalization. Patient voiced understanding and was appreciative of f/u call.  Plan: [facility] RN CM will speak with patient if she returns a call or will make another call attempt within 1-3 business days   Uday Jantz L. Lavina Hamman, RN, BSN, [place] Management Care Coordinator Direct Number [phone] Mobile number [phone]  Main THN number [phone] Fax number [phone]

## 2019-04-29 DIAGNOSIS — J181 Lobar pneumonia, unspecified organism: Secondary | ICD-10-CM | POA: Diagnosis not present

## 2019-04-29 DIAGNOSIS — J9601 Acute respiratory failure with hypoxia: Secondary | ICD-10-CM | POA: Diagnosis not present

## 2019-04-29 DIAGNOSIS — K219 Gastro-esophageal reflux disease without esophagitis: Secondary | ICD-10-CM | POA: Diagnosis not present

## 2019-04-29 DIAGNOSIS — N186 End stage renal disease: Secondary | ICD-10-CM | POA: Diagnosis not present

## 2019-04-29 DIAGNOSIS — I4821 Permanent atrial fibrillation: Secondary | ICD-10-CM | POA: Diagnosis not present

## 2019-04-29 DIAGNOSIS — I5042 Chronic combined systolic (congestive) and diastolic (congestive) heart failure: Secondary | ICD-10-CM | POA: Diagnosis not present

## 2019-04-29 DIAGNOSIS — D631 Anemia in chronic kidney disease: Secondary | ICD-10-CM | POA: Diagnosis not present

## 2019-04-29 DIAGNOSIS — I132 Hypertensive heart and chronic kidney disease with heart failure and with stage 5 chronic kidney disease, or end stage renal disease: Secondary | ICD-10-CM | POA: Diagnosis not present

## 2019-04-29 DIAGNOSIS — T80818D Extravasation of other vesicant agent, subsequent encounter: Secondary | ICD-10-CM | POA: Diagnosis not present

## 2019-04-29 DIAGNOSIS — E782 Mixed hyperlipidemia: Secondary | ICD-10-CM | POA: Diagnosis not present

## 2019-04-30 ENCOUNTER — Other Ambulatory Visit: Payer: Self-pay | Admitting: *Deleted

## 2019-04-30 DIAGNOSIS — N19 Unspecified kidney failure: Secondary | ICD-10-CM | POA: Diagnosis not present

## 2019-04-30 DIAGNOSIS — D631 Anemia in chronic kidney disease: Secondary | ICD-10-CM | POA: Diagnosis not present

## 2019-04-30 DIAGNOSIS — I132 Hypertensive heart and chronic kidney disease with heart failure and with stage 5 chronic kidney disease, or end stage renal disease: Secondary | ICD-10-CM | POA: Diagnosis not present

## 2019-04-30 DIAGNOSIS — J181 Lobar pneumonia, unspecified organism: Secondary | ICD-10-CM | POA: Diagnosis not present

## 2019-04-30 DIAGNOSIS — E669 Obesity, unspecified: Secondary | ICD-10-CM | POA: Diagnosis not present

## 2019-04-30 DIAGNOSIS — J9601 Acute respiratory failure with hypoxia: Secondary | ICD-10-CM | POA: Diagnosis not present

## 2019-04-30 DIAGNOSIS — Z992 Dependence on renal dialysis: Secondary | ICD-10-CM | POA: Diagnosis not present

## 2019-04-30 DIAGNOSIS — Z6839 Body mass index (BMI) 39.0-39.9, adult: Secondary | ICD-10-CM | POA: Diagnosis not present

## 2019-04-30 DIAGNOSIS — I959 Hypotension, unspecified: Secondary | ICD-10-CM | POA: Diagnosis not present

## 2019-04-30 DIAGNOSIS — T80818D Extravasation of other vesicant agent, subsequent encounter: Secondary | ICD-10-CM | POA: Diagnosis not present

## 2019-04-30 DIAGNOSIS — N186 End stage renal disease: Secondary | ICD-10-CM | POA: Diagnosis not present

## 2019-04-30 DIAGNOSIS — I5042 Chronic combined systolic (congestive) and diastolic (congestive) heart failure: Secondary | ICD-10-CM | POA: Diagnosis not present

## 2019-04-30 DIAGNOSIS — I4891 Unspecified atrial fibrillation: Secondary | ICD-10-CM | POA: Diagnosis not present

## 2019-04-30 DIAGNOSIS — I4821 Permanent atrial fibrillation: Secondary | ICD-10-CM | POA: Diagnosis not present

## 2019-04-30 DIAGNOSIS — K219 Gastro-esophageal reflux disease without esophagitis: Secondary | ICD-10-CM | POA: Diagnosis not present

## 2019-04-30 DIAGNOSIS — E782 Mixed hyperlipidemia: Secondary | ICD-10-CM | POA: Diagnosis not present

## 2019-04-30 NOTE — Patient Outreach (Signed)
[facility]) Care Management  04/30/2019  KAYSEA RAYA 1941/03/24 [id number]   Care coordination- Nepro supplement  TH[facility] RN CM collaborated with [facility] CMA and [employer] staff, Haylee and Daina via e mail to inquire about assistance with Mrs Hadasa Gasner No available coupons at [facility] office [place]) Rachel offered to send Mrs Candy coupons in the mail and provided the application for [employer]'s patient assistance program, Pathway, as a reference for when patients are solely dependent upon ONS, but financially unable to purchase [facility] RN CM sent Mrs [place] address to [facility] for coupons to be sent to the patient's home and sent Mrs Pech a copy of the application via mail   Plan: [facility] RN CM follow up with Mrs Boesen within 7-10 business days to continue complex CM services, to discuss Nepro coupons and patient assistance application that is being mailed by CM to her today  Pt encouraged to return a call to [facility] RN CM prn  Routed note to MDs  Joelene Millin L. Lavina Hamman, RN, BSN, [place] Coordinator Office number [phone] Mobile number [phone]  Main THN number [phone] Fax number [phone]

## 2019-05-01 DIAGNOSIS — H26493 Other secondary cataract, bilateral: Secondary | ICD-10-CM | POA: Diagnosis not present

## 2019-05-01 DIAGNOSIS — D23121 Other benign neoplasm of skin of left upper eyelid, including canthus: Secondary | ICD-10-CM | POA: Diagnosis not present

## 2019-05-02 ENCOUNTER — Ambulatory Visit (INDEPENDENT_AMBULATORY_CARE_PROVIDER_SITE_OTHER): Payer: Medicare HMO | Admitting: Pharmacist

## 2019-05-02 DIAGNOSIS — E782 Mixed hyperlipidemia: Secondary | ICD-10-CM | POA: Diagnosis not present

## 2019-05-02 DIAGNOSIS — I482 Chronic atrial fibrillation, unspecified: Secondary | ICD-10-CM | POA: Diagnosis not present

## 2019-05-02 DIAGNOSIS — Z992 Dependence on renal dialysis: Secondary | ICD-10-CM | POA: Diagnosis not present

## 2019-05-02 DIAGNOSIS — I4821 Permanent atrial fibrillation: Secondary | ICD-10-CM | POA: Diagnosis not present

## 2019-05-02 DIAGNOSIS — N186 End stage renal disease: Secondary | ICD-10-CM | POA: Diagnosis not present

## 2019-05-02 DIAGNOSIS — Z5181 Encounter for therapeutic drug level monitoring: Secondary | ICD-10-CM

## 2019-05-02 DIAGNOSIS — I5042 Chronic combined systolic (congestive) and diastolic (congestive) heart failure: Secondary | ICD-10-CM | POA: Diagnosis not present

## 2019-05-02 DIAGNOSIS — D631 Anemia in chronic kidney disease: Secondary | ICD-10-CM | POA: Diagnosis not present

## 2019-05-02 DIAGNOSIS — K219 Gastro-esophageal reflux disease without esophagitis: Secondary | ICD-10-CM | POA: Diagnosis not present

## 2019-05-02 DIAGNOSIS — T80818D Extravasation of other vesicant agent, subsequent encounter: Secondary | ICD-10-CM | POA: Diagnosis not present

## 2019-05-02 DIAGNOSIS — J181 Lobar pneumonia, unspecified organism: Secondary | ICD-10-CM | POA: Diagnosis not present

## 2019-05-02 DIAGNOSIS — I132 Hypertensive heart and chronic kidney disease with heart failure and with stage 5 chronic kidney disease, or end stage renal disease: Secondary | ICD-10-CM | POA: Diagnosis not present

## 2019-05-02 DIAGNOSIS — J9601 Acute respiratory failure with hypoxia: Secondary | ICD-10-CM | POA: Diagnosis not present

## 2019-05-02 LAB — POCT INR: INR: 2 (ref 2.0–3.0)

## 2019-05-05 DIAGNOSIS — I132 Hypertensive heart and chronic kidney disease with heart failure and with stage 5 chronic kidney disease, or end stage renal disease: Secondary | ICD-10-CM | POA: Diagnosis not present

## 2019-05-05 DIAGNOSIS — I4821 Permanent atrial fibrillation: Secondary | ICD-10-CM | POA: Diagnosis not present

## 2019-05-05 DIAGNOSIS — N186 End stage renal disease: Secondary | ICD-10-CM | POA: Diagnosis not present

## 2019-05-05 DIAGNOSIS — E782 Mixed hyperlipidemia: Secondary | ICD-10-CM | POA: Diagnosis not present

## 2019-05-05 DIAGNOSIS — J9601 Acute respiratory failure with hypoxia: Secondary | ICD-10-CM | POA: Diagnosis not present

## 2019-05-05 DIAGNOSIS — I5042 Chronic combined systolic (congestive) and diastolic (congestive) heart failure: Secondary | ICD-10-CM | POA: Diagnosis not present

## 2019-05-05 DIAGNOSIS — Z992 Dependence on renal dialysis: Secondary | ICD-10-CM | POA: Diagnosis not present

## 2019-05-05 DIAGNOSIS — T80818D Extravasation of other vesicant agent, subsequent encounter: Secondary | ICD-10-CM | POA: Diagnosis not present

## 2019-05-05 DIAGNOSIS — K219 Gastro-esophageal reflux disease without esophagitis: Secondary | ICD-10-CM | POA: Diagnosis not present

## 2019-05-05 DIAGNOSIS — J181 Lobar pneumonia, unspecified organism: Secondary | ICD-10-CM | POA: Diagnosis not present

## 2019-05-05 DIAGNOSIS — D631 Anemia in chronic kidney disease: Secondary | ICD-10-CM | POA: Diagnosis not present

## 2019-05-06 ENCOUNTER — Other Ambulatory Visit: Payer: Self-pay | Admitting: *Deleted

## 2019-05-06 ENCOUNTER — Other Ambulatory Visit: Payer: Self-pay

## 2019-05-06 DIAGNOSIS — I5042 Chronic combined systolic (congestive) and diastolic (congestive) heart failure: Secondary | ICD-10-CM | POA: Diagnosis not present

## 2019-05-06 DIAGNOSIS — D631 Anemia in chronic kidney disease: Secondary | ICD-10-CM | POA: Diagnosis not present

## 2019-05-06 DIAGNOSIS — T80818D Extravasation of other vesicant agent, subsequent encounter: Secondary | ICD-10-CM | POA: Diagnosis not present

## 2019-05-06 DIAGNOSIS — K219 Gastro-esophageal reflux disease without esophagitis: Secondary | ICD-10-CM | POA: Diagnosis not present

## 2019-05-06 DIAGNOSIS — J181 Lobar pneumonia, unspecified organism: Secondary | ICD-10-CM | POA: Diagnosis not present

## 2019-05-06 DIAGNOSIS — I132 Hypertensive heart and chronic kidney disease with heart failure and with stage 5 chronic kidney disease, or end stage renal disease: Secondary | ICD-10-CM | POA: Diagnosis not present

## 2019-05-06 DIAGNOSIS — J9601 Acute respiratory failure with hypoxia: Secondary | ICD-10-CM | POA: Diagnosis not present

## 2019-05-06 DIAGNOSIS — N186 End stage renal disease: Secondary | ICD-10-CM | POA: Diagnosis not present

## 2019-05-06 DIAGNOSIS — E782 Mixed hyperlipidemia: Secondary | ICD-10-CM | POA: Diagnosis not present

## 2019-05-06 DIAGNOSIS — I4821 Permanent atrial fibrillation: Secondary | ICD-10-CM | POA: Diagnosis not present

## 2019-05-06 NOTE — Patient Outreach (Signed)
[facility]) Care Management  05/06/2019  Sabrina Beck 01/22/1941 [id number]   Telephone screen for Complex CMReferral- week 2   Referral Date:04/28/19 Referral Source:[facility] hospital liaison, Sabrina Beck for consult Post hospital follow up   Diagnoses of Heart Failure, COPD/ Pneumonia  Date of Admission:04/08/19 Diagnosis:acute hypoxic respiratory failure with hypoxia due to lobar pneumonia as well as pulmonary edema, sepsis due to HCAP and Colitis, Afib with RVR Date of Discharge:04/25/19 [facility] Insurance:[employer] medicareHMO/PPO  Transition of care services noted to be completed by primary care MD office staff [facility] [employer]  Sabrina Avacyn Beck has been out of the hospital for 11 days   Patient is able to verify HIPAA, DOB and Address Reviewed the reason for the call to follow up on need for Nepro, resources sent and further assessment for complex after her d/c from [facility] She voiced understanding   Consent: [facility] RN CM reviewed [facility] services with patient. Patient gave verbal consent for [facility] telephonic RN CM services. Advised patient that other post discharge calls may occur to assess how the patient is doing following the recent hospitalization. Patient voiced understanding and was appreciative of f/u call. Designated Party release form dated 06/04/15 indicates all [facility] practice staff can speak with Sabrina Beck and Sabrina Beck, her sons, per pt permission  She confirms this during this call   Assessment needs identified   Need Nepro Sabrina Beck reports she did receive the [employer] application for Nepro She and [facility] RN CM reviewed it together to indicate the areas to be filled out. She is going to take the application to her dialysis treatment on 05/07/19 to get assist from there dialysis staff to complete and fax to [employer] She will call [facility] RN CM if further questions She agrees to a  follow up call after forms faxed  She reports the Nepro coupons have not arrived at this time   Diet ---She confirms she is still following a low sodium heart health diet and per her dialysis staff she is following a fluid restriction recommendation of not over 32 ounces of fluid   She reports her right arm is better after an iv infiltration   She reports the home health RN examined and dressed the site during the home visits today 05/06/19   Hospital colitis symptoms are reported resolved  She denies any medical concern today She reports she is doing better   CHF She confirms she has scales  She weighed 218 lbs today 05/06/19  She reports her home health RN was pleased with the management of her wt She was at 215 lbs on the day of hospital d/c She was admitted at 80 Lbs She is monitoring for edema and reports she is doing well today Denies CHF s/s. 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 She reports being tired today after her dialysis session. She is independent/assist with her ADLs and is needing assist with iADLs at this time. She reports she has transportation to get to medical appointments and her sons assist her to get to appointments She lives  in a one story home    Conditions:acute hypoxic respiratory failure with hypoxia due to lobar pneumonia as well as pulmonary edema, sepsis due to HCAP and Colitis, AFIB with RVR,end-stage renal disease, permanent atrial fibrillation on chronic anticoagulation with Coumadin, chronicsystolic anddiastolic CHF,HTN, GERD, mixed HLD, Pt reports she ws a former smoker macular degenerative  disease, CKD stage 5, ESRD, malignant neoplasm of uterus, coagulopathy, anemia  Covid negative on 04/08/19  Falls Denied by pt but 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  DME: cane , eyeglasses, scales  Medications:She denies concerns with taking medications as prescribed, affording medications, side effects of medications and questions about medications  Appointments:She had appointment with  Dr Gerarda Fraction on 04/30/19 and she called for cardiology appointment She reports all her appointments "went well"    Advance directives:Denies need for assist with advance directives She does not have written advance directives but reports she has spoke with her sons about her wishes. [facility] RN CM discussed advance directives and offered [facility]N SW assistance but she at this time is not interested She informs CM she will contact CM when and if she is ready     Plan: [facility] RN CM follow up with Sabrina Wyble within 7-10 business days to continue complex CM services  Pt encouraged to return a call to [facility] RN CM prn  Routed note to MDs  [facility] CM Care Plan Problem One     Most Recent Value  Care Plan Problem One  Risk for hospital readmission secondary to recently diagnosed CHF  Role Documenting the Problem One  Care Management Telephonic Coordinator  Care Plan for Problem One  Active  THN Long Term Goal   Over the next 31 days, patient will not experience hospital readmission, as evidenced by patient reporting and review of EMR during [facility] RN CCM outreach  [place] Term Goal Start Date  04/28/19  Interventions for Problem One Long Term Goal  Discussed home management of CHF and completion of [employer] patient assistance application with help of her dialysis center staff on 05/07/19 with faxing back to [employer]  THN CM Short Term Goal #1   Over the next 7 days, patient will schedule hospital follow up office visit appointments with cardiologisT, as evidenced by patient reporting during [facility] RN CCM outreach   [facility] CM Short Term Goal #1 Start Date  04/28/19  [facility] CM Short Term Goal #1 Met Date  05/06/19  THN CM Short Term Goal #2   over the next 14-21 days patient Nepro discount information will be offiered to the patient so she can take as ordered as evidence by her verbalizing that Nepro obtained during contact with her  [facility] CM Short Term Goal #2 Start Date  04/28/19  Interventions for Short Term Goal #2  Assessedarrival of application from [employer], reviewed it together to indicate the areas to be filled out.      Kimberly L. Lavina Hamman, RN, BSN, [place] Management Care Coordinator Direct Number [phone] Mobile number [phone]  Main THN number [phone] Fax number [phone]

## 2019-05-07 DIAGNOSIS — Z992 Dependence on renal dialysis: Secondary | ICD-10-CM | POA: Diagnosis not present

## 2019-05-07 DIAGNOSIS — N186 End stage renal disease: Secondary | ICD-10-CM | POA: Diagnosis not present

## 2019-05-08 DIAGNOSIS — E782 Mixed hyperlipidemia: Secondary | ICD-10-CM | POA: Diagnosis not present

## 2019-05-08 DIAGNOSIS — J9601 Acute respiratory failure with hypoxia: Secondary | ICD-10-CM | POA: Diagnosis not present

## 2019-05-08 DIAGNOSIS — J181 Lobar pneumonia, unspecified organism: Secondary | ICD-10-CM | POA: Diagnosis not present

## 2019-05-08 DIAGNOSIS — N186 End stage renal disease: Secondary | ICD-10-CM | POA: Diagnosis not present

## 2019-05-08 DIAGNOSIS — T80818D Extravasation of other vesicant agent, subsequent encounter: Secondary | ICD-10-CM | POA: Diagnosis not present

## 2019-05-08 DIAGNOSIS — K219 Gastro-esophageal reflux disease without esophagitis: Secondary | ICD-10-CM | POA: Diagnosis not present

## 2019-05-08 DIAGNOSIS — I5042 Chronic combined systolic (congestive) and diastolic (congestive) heart failure: Secondary | ICD-10-CM | POA: Diagnosis not present

## 2019-05-08 DIAGNOSIS — I4821 Permanent atrial fibrillation: Secondary | ICD-10-CM | POA: Diagnosis not present

## 2019-05-08 DIAGNOSIS — I132 Hypertensive heart and chronic kidney disease with heart failure and with stage 5 chronic kidney disease, or end stage renal disease: Secondary | ICD-10-CM | POA: Diagnosis not present

## 2019-05-08 DIAGNOSIS — D631 Anemia in chronic kidney disease: Secondary | ICD-10-CM | POA: Diagnosis not present

## 2019-05-09 ENCOUNTER — Ambulatory Visit (INDEPENDENT_AMBULATORY_CARE_PROVIDER_SITE_OTHER): Payer: Medicare HMO | Admitting: Interventional Cardiology

## 2019-05-09 DIAGNOSIS — J9601 Acute respiratory failure with hypoxia: Secondary | ICD-10-CM | POA: Diagnosis not present

## 2019-05-09 DIAGNOSIS — D631 Anemia in chronic kidney disease: Secondary | ICD-10-CM | POA: Diagnosis not present

## 2019-05-09 DIAGNOSIS — I482 Chronic atrial fibrillation, unspecified: Secondary | ICD-10-CM | POA: Diagnosis not present

## 2019-05-09 DIAGNOSIS — Z992 Dependence on renal dialysis: Secondary | ICD-10-CM | POA: Diagnosis not present

## 2019-05-09 DIAGNOSIS — I5042 Chronic combined systolic (congestive) and diastolic (congestive) heart failure: Secondary | ICD-10-CM | POA: Diagnosis not present

## 2019-05-09 DIAGNOSIS — J181 Lobar pneumonia, unspecified organism: Secondary | ICD-10-CM | POA: Diagnosis not present

## 2019-05-09 DIAGNOSIS — Z5181 Encounter for therapeutic drug level monitoring: Secondary | ICD-10-CM

## 2019-05-09 DIAGNOSIS — T80818D Extravasation of other vesicant agent, subsequent encounter: Secondary | ICD-10-CM | POA: Diagnosis not present

## 2019-05-09 DIAGNOSIS — I132 Hypertensive heart and chronic kidney disease with heart failure and with stage 5 chronic kidney disease, or end stage renal disease: Secondary | ICD-10-CM | POA: Diagnosis not present

## 2019-05-09 DIAGNOSIS — N186 End stage renal disease: Secondary | ICD-10-CM | POA: Diagnosis not present

## 2019-05-09 DIAGNOSIS — E782 Mixed hyperlipidemia: Secondary | ICD-10-CM | POA: Diagnosis not present

## 2019-05-09 DIAGNOSIS — Z23 Encounter for immunization: Secondary | ICD-10-CM | POA: Diagnosis not present

## 2019-05-09 DIAGNOSIS — K219 Gastro-esophageal reflux disease without esophagitis: Secondary | ICD-10-CM | POA: Diagnosis not present

## 2019-05-09 DIAGNOSIS — I4821 Permanent atrial fibrillation: Secondary | ICD-10-CM | POA: Diagnosis not present

## 2019-05-09 LAB — POCT INR: INR: 4 — AB (ref 2.0–3.0)

## 2019-05-09 NOTE — Patient Instructions (Signed)
Description   Spoke with Malachy Mood RN with [facility] while in the home and instructed pt to hold today's dose take 1 tablet tomorrow then start taking the dose you should be taking which is 1.5 tablets daily except 2 tablets on Mondays. Recheck on 05/15/2019 by [facility]. Dialysis on M,W,F

## 2019-05-12 DIAGNOSIS — N2581 Secondary hyperparathyroidism of renal origin: Secondary | ICD-10-CM | POA: Diagnosis not present

## 2019-05-12 DIAGNOSIS — Z992 Dependence on renal dialysis: Secondary | ICD-10-CM | POA: Diagnosis not present

## 2019-05-12 DIAGNOSIS — Z23 Encounter for immunization: Secondary | ICD-10-CM | POA: Diagnosis not present

## 2019-05-12 DIAGNOSIS — N186 End stage renal disease: Secondary | ICD-10-CM | POA: Diagnosis not present

## 2019-05-13 ENCOUNTER — Other Ambulatory Visit: Payer: Self-pay | Admitting: *Deleted

## 2019-05-13 ENCOUNTER — Other Ambulatory Visit: Payer: Self-pay

## 2019-05-13 DIAGNOSIS — I5042 Chronic combined systolic (congestive) and diastolic (congestive) heart failure: Secondary | ICD-10-CM | POA: Diagnosis not present

## 2019-05-13 DIAGNOSIS — J9601 Acute respiratory failure with hypoxia: Secondary | ICD-10-CM | POA: Diagnosis not present

## 2019-05-13 DIAGNOSIS — I132 Hypertensive heart and chronic kidney disease with heart failure and with stage 5 chronic kidney disease, or end stage renal disease: Secondary | ICD-10-CM | POA: Diagnosis not present

## 2019-05-13 DIAGNOSIS — E782 Mixed hyperlipidemia: Secondary | ICD-10-CM | POA: Diagnosis not present

## 2019-05-13 DIAGNOSIS — N186 End stage renal disease: Secondary | ICD-10-CM | POA: Diagnosis not present

## 2019-05-13 DIAGNOSIS — K219 Gastro-esophageal reflux disease without esophagitis: Secondary | ICD-10-CM | POA: Diagnosis not present

## 2019-05-13 DIAGNOSIS — I4821 Permanent atrial fibrillation: Secondary | ICD-10-CM | POA: Diagnosis not present

## 2019-05-13 DIAGNOSIS — J181 Lobar pneumonia, unspecified organism: Secondary | ICD-10-CM | POA: Diagnosis not present

## 2019-05-13 DIAGNOSIS — T80818D Extravasation of other vesicant agent, subsequent encounter: Secondary | ICD-10-CM | POA: Diagnosis not present

## 2019-05-13 DIAGNOSIS — D631 Anemia in chronic kidney disease: Secondary | ICD-10-CM | POA: Diagnosis not present

## 2019-05-13 NOTE — Patient Outreach (Signed)
[facility]) Care Management  05/13/2019  Sabrina Beck Apr 02, 1941 [id number]   Telephone assessment for Complex CMReferral- week 3   Referral Date:04/28/19 Referral Source:[facility]N hospital liaison, Sabrina Beck for consult Post hospital follow up   Diagnoses of Heart Failure, COPD/ Pneumonia  Date of Admission:04/08/19 Diagnosis:acute hypoxic respiratory failure with hypoxia due to lobar pneumonia as well as pulmonary edema, sepsis due to HCAP and Colitis,Afibwith RVR Date of Discharge:04/25/19 Facility:[facility] Insurance:AetnamedicareHMO/PPO  Transition of care services noted to be completed by primary care MD office staff Sabrina Beck  Sabrina Beck has been out of the hospital for 18 days  She denies any medical concerns today and reports she is doing "good'  Patient is able to verify HIPAA, DOB and Address Reviewed the reason for the call to follow up on Nepro, resources sent and further assessment forcomplex after her d/c from [facility] She voiced understanding   Consent:THN RN CM reviewed [facility] services with patient. Patient gave verbal consent for [facility] telephonic RN CM services. Advised patient that other post discharge calls may occur to assess how the patient is doing following the recent hospitalization. Patient voiced understanding and was appreciative of f/u call. DesignatedParty release form dated 06/04/15 indicates all [facility] practice staff can speak with Sabrina Beck and Sabrina Beck, her sons, per pt permission   Nepro Sabrina Beck reports she did take the [employer] patient assistance application to her [facility] dialysis staff but the form has not been returned to her and she does not know the status of it at this time. She reports a change in Nephrologist for [place] from Dr Hinda Lenis to Dr Juleen China which may be the pending issues. She has not yet received the Nepro coupons from the  [facility] representative. 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 anticoagulation with Coumadin, chronicsystolic anddiastolic CHF,HTN, GERD,mixedHLD, Pt reports she ws a former smokermacular degenerative disease, CKD stage 5, ESRD, malignant neoplasm of uterus, coagulopathy, anemia  Covid negativeon 04/08/19  FallsDenied by pt but 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  TGG:YIRS , eyeglasses, scales  Medications:Shedenies concerns with taking medications as prescribed, affording medications, side effects of medications and questions about medications   Upcoming appointments [facility] Mondays, Wednesdays and Fridays- next 05/14/19 Wednesday Cardiology Sabrina Beck 06/05/19 - Sabrina Beck states this appointment is a rescheduled appointment   Plan: [facility] RN CMfollow up with Sabrina Belk within 14-17 business days to continue complex CM services  Pt encouraged to return a call to [facility] RN CM prn  Routed note to MDs  [facility] CM Care Plan Problem One     Most Recent Value  Care  Plan Problem One  Risk for hospital readmission secondary to  recently diagnosed CHF  Role Documenting the Problem One  Care Management Telephonic [place] for Problem One  Active  THN Long Term Goal   Over the next 31 days, patient will not experience hospital readmission, as evidenced by patient reporting and review of EMR during [facility] RN CCM outreach  [place] Term Goal Start Date  04/28/19  Interventions for Problem One Long Term Goal  Assessed - No admissions or ED visits in the last 18 days, attending dialysis with issues, assessed medical concerns, to follow up   [facility] CM Short Term Goal #1   Over the next 7 days, patient will schedule hospital follow up office visit appointments with cardiologisT, as evidenced by patient reporting during [facility] RN CCM outreach  [facility] CM Short Term Goal #1 Start Date  04/28/19  [facility] CM Short Term Goal #1 Met Date  05/06/19  THN CM Short Term Goal #2   over the next 14-21 days patient Nepro discount information will be offiered to the patient so she can take as ordered as evidence by her verbalizing that Nepro obtained during contact with her  [facility] CM Short Term Goal #2 Start Date  04/28/19  Interventions for Short Term Goal #2  assess for completion of patient assistance form and coupons mailed, discussed follow up, offered assistance to follow up       [place]. Sabrina Hamman, RN, BSN, [place] Coordinator Office number [phone] Mobile number [phone]  Main [facility] number [phone] Fax number [phone]

## 2019-05-14 DIAGNOSIS — Z23 Encounter for immunization: Secondary | ICD-10-CM | POA: Diagnosis not present

## 2019-05-14 DIAGNOSIS — Z992 Dependence on renal dialysis: Secondary | ICD-10-CM | POA: Diagnosis not present

## 2019-05-14 DIAGNOSIS — N186 End stage renal disease: Secondary | ICD-10-CM | POA: Diagnosis not present

## 2019-05-15 ENCOUNTER — Ambulatory Visit (INDEPENDENT_AMBULATORY_CARE_PROVIDER_SITE_OTHER): Payer: Medicare HMO | Admitting: *Deleted

## 2019-05-15 DIAGNOSIS — D631 Anemia in chronic kidney disease: Secondary | ICD-10-CM | POA: Diagnosis not present

## 2019-05-15 DIAGNOSIS — N186 End stage renal disease: Secondary | ICD-10-CM | POA: Diagnosis not present

## 2019-05-15 DIAGNOSIS — J181 Lobar pneumonia, unspecified organism: Secondary | ICD-10-CM | POA: Diagnosis not present

## 2019-05-15 DIAGNOSIS — K219 Gastro-esophageal reflux disease without esophagitis: Secondary | ICD-10-CM | POA: Diagnosis not present

## 2019-05-15 DIAGNOSIS — T80818D Extravasation of other vesicant agent, subsequent encounter: Secondary | ICD-10-CM | POA: Diagnosis not present

## 2019-05-15 DIAGNOSIS — I4821 Permanent atrial fibrillation: Secondary | ICD-10-CM | POA: Diagnosis not present

## 2019-05-15 DIAGNOSIS — E782 Mixed hyperlipidemia: Secondary | ICD-10-CM | POA: Diagnosis not present

## 2019-05-15 DIAGNOSIS — J9601 Acute respiratory failure with hypoxia: Secondary | ICD-10-CM | POA: Diagnosis not present

## 2019-05-15 DIAGNOSIS — Z5181 Encounter for therapeutic drug level monitoring: Secondary | ICD-10-CM | POA: Diagnosis not present

## 2019-05-15 DIAGNOSIS — I5042 Chronic combined systolic (congestive) and diastolic (congestive) heart failure: Secondary | ICD-10-CM | POA: Diagnosis not present

## 2019-05-15 DIAGNOSIS — I132 Hypertensive heart and chronic kidney disease with heart failure and with stage 5 chronic kidney disease, or end stage renal disease: Secondary | ICD-10-CM | POA: Diagnosis not present

## 2019-05-15 LAB — POCT INR: INR: 1.2 — AB (ref 2.0–3.0)

## 2019-05-15 NOTE — Patient Instructions (Signed)
Spoke with Janett Billow RN with [facility] while in the home and instructed pt to take 2 tablets x 3 days (10/8, 10/9, 10/10) then Sunday 10/11 resume 1.5 tablets daily except 2 tablets on Mondays. Recheck on 05/22/2019 by [facility]. Dialysis on M,W,F

## 2019-05-16 DIAGNOSIS — N186 End stage renal disease: Secondary | ICD-10-CM | POA: Diagnosis not present

## 2019-05-16 DIAGNOSIS — Z992 Dependence on renal dialysis: Secondary | ICD-10-CM | POA: Diagnosis not present

## 2019-05-16 DIAGNOSIS — Z23 Encounter for immunization: Secondary | ICD-10-CM | POA: Diagnosis not present

## 2019-05-19 DIAGNOSIS — N186 End stage renal disease: Secondary | ICD-10-CM | POA: Diagnosis not present

## 2019-05-19 DIAGNOSIS — Z992 Dependence on renal dialysis: Secondary | ICD-10-CM | POA: Diagnosis not present

## 2019-05-19 DIAGNOSIS — Z23 Encounter for immunization: Secondary | ICD-10-CM | POA: Diagnosis not present

## 2019-05-19 DIAGNOSIS — N2581 Secondary hyperparathyroidism of renal origin: Secondary | ICD-10-CM | POA: Diagnosis not present

## 2019-05-19 DIAGNOSIS — D509 Iron deficiency anemia, unspecified: Secondary | ICD-10-CM | POA: Diagnosis not present

## 2019-05-20 DIAGNOSIS — T80818D Extravasation of other vesicant agent, subsequent encounter: Secondary | ICD-10-CM | POA: Diagnosis not present

## 2019-05-20 DIAGNOSIS — N186 End stage renal disease: Secondary | ICD-10-CM | POA: Diagnosis not present

## 2019-05-20 DIAGNOSIS — I5042 Chronic combined systolic (congestive) and diastolic (congestive) heart failure: Secondary | ICD-10-CM | POA: Diagnosis not present

## 2019-05-20 DIAGNOSIS — D631 Anemia in chronic kidney disease: Secondary | ICD-10-CM | POA: Diagnosis not present

## 2019-05-20 DIAGNOSIS — I132 Hypertensive heart and chronic kidney disease with heart failure and with stage 5 chronic kidney disease, or end stage renal disease: Secondary | ICD-10-CM | POA: Diagnosis not present

## 2019-05-20 DIAGNOSIS — E782 Mixed hyperlipidemia: Secondary | ICD-10-CM | POA: Diagnosis not present

## 2019-05-20 DIAGNOSIS — J181 Lobar pneumonia, unspecified organism: Secondary | ICD-10-CM | POA: Diagnosis not present

## 2019-05-20 DIAGNOSIS — J9601 Acute respiratory failure with hypoxia: Secondary | ICD-10-CM | POA: Diagnosis not present

## 2019-05-20 DIAGNOSIS — K219 Gastro-esophageal reflux disease without esophagitis: Secondary | ICD-10-CM | POA: Diagnosis not present

## 2019-05-20 DIAGNOSIS — I4821 Permanent atrial fibrillation: Secondary | ICD-10-CM | POA: Diagnosis not present

## 2019-05-21 DIAGNOSIS — Z992 Dependence on renal dialysis: Secondary | ICD-10-CM | POA: Diagnosis not present

## 2019-05-21 DIAGNOSIS — N186 End stage renal disease: Secondary | ICD-10-CM | POA: Diagnosis not present

## 2019-05-21 DIAGNOSIS — Z23 Encounter for immunization: Secondary | ICD-10-CM | POA: Diagnosis not present

## 2019-05-22 ENCOUNTER — Ambulatory Visit (INDEPENDENT_AMBULATORY_CARE_PROVIDER_SITE_OTHER): Payer: Medicare HMO | Admitting: *Deleted

## 2019-05-22 ENCOUNTER — Telehealth: Payer: Self-pay | Admitting: Cardiovascular Disease

## 2019-05-22 DIAGNOSIS — J181 Lobar pneumonia, unspecified organism: Secondary | ICD-10-CM | POA: Diagnosis not present

## 2019-05-22 DIAGNOSIS — N186 End stage renal disease: Secondary | ICD-10-CM | POA: Diagnosis not present

## 2019-05-22 DIAGNOSIS — I4821 Permanent atrial fibrillation: Secondary | ICD-10-CM

## 2019-05-22 DIAGNOSIS — K219 Gastro-esophageal reflux disease without esophagitis: Secondary | ICD-10-CM | POA: Diagnosis not present

## 2019-05-22 DIAGNOSIS — D631 Anemia in chronic kidney disease: Secondary | ICD-10-CM | POA: Diagnosis not present

## 2019-05-22 DIAGNOSIS — Z5181 Encounter for therapeutic drug level monitoring: Secondary | ICD-10-CM | POA: Diagnosis not present

## 2019-05-22 DIAGNOSIS — J9601 Acute respiratory failure with hypoxia: Secondary | ICD-10-CM | POA: Diagnosis not present

## 2019-05-22 DIAGNOSIS — E782 Mixed hyperlipidemia: Secondary | ICD-10-CM | POA: Diagnosis not present

## 2019-05-22 DIAGNOSIS — T80818D Extravasation of other vesicant agent, subsequent encounter: Secondary | ICD-10-CM | POA: Diagnosis not present

## 2019-05-22 DIAGNOSIS — I5042 Chronic combined systolic (congestive) and diastolic (congestive) heart failure: Secondary | ICD-10-CM | POA: Diagnosis not present

## 2019-05-22 DIAGNOSIS — I132 Hypertensive heart and chronic kidney disease with heart failure and with stage 5 chronic kidney disease, or end stage renal disease: Secondary | ICD-10-CM | POA: Diagnosis not present

## 2019-05-22 LAB — POCT INR: INR: 1.7 — AB (ref 2.0–3.0)

## 2019-05-22 NOTE — Telephone Encounter (Signed)
PT 19.9 INR 1.7

## 2019-05-22 NOTE — Patient Instructions (Signed)
Increase dose to 1 1/2 tablets daily except 2 tablets on Mondays and Thursdays  Recheck on 05/29/2019 by [facility]. Dialysis on M,W,F Order given to [facility]

## 2019-05-22 NOTE — Telephone Encounter (Signed)
Done.  See coumadin note. 

## 2019-05-23 DIAGNOSIS — N186 End stage renal disease: Secondary | ICD-10-CM | POA: Diagnosis not present

## 2019-05-23 DIAGNOSIS — Z992 Dependence on renal dialysis: Secondary | ICD-10-CM | POA: Diagnosis not present

## 2019-05-23 DIAGNOSIS — Z23 Encounter for immunization: Secondary | ICD-10-CM | POA: Diagnosis not present

## 2019-05-26 DIAGNOSIS — Z23 Encounter for immunization: Secondary | ICD-10-CM | POA: Diagnosis not present

## 2019-05-26 DIAGNOSIS — N186 End stage renal disease: Secondary | ICD-10-CM | POA: Diagnosis not present

## 2019-05-26 DIAGNOSIS — Z992 Dependence on renal dialysis: Secondary | ICD-10-CM | POA: Diagnosis not present

## 2019-05-27 ENCOUNTER — Other Ambulatory Visit: Payer: Self-pay

## 2019-05-27 ENCOUNTER — Other Ambulatory Visit: Payer: Self-pay | Admitting: *Deleted

## 2019-05-27 NOTE — Patient Outreach (Signed)
[facility]) Care Management  05/27/2019  Sabrina Beck [birth year] [id number]   Telephone assessment for Complex CMReferral- week 4  Referral Date:04/28/19 Referral Source:[facility]N hospital liaison, Rochele Raring for consult Post hospital follow up   Diagnoses of Heart Failure,COPD/ Pneumonia  Date of Admission:04/08/19 Diagnosis:acute hypoxic respiratory failure with hypoxia due to lobar pneumonia as well as pulmonary edema, sepsis due to HCAP and Colitis,Afibwith RVR Date of Discharge:04/25/19 Facility:[facility] Insurance:AetnamedicareHMO/PPO  Transition of care services noted to be completed by primary care MD office staff [facility] [employer]  [name] has been out of the hospital for 32 days She denies any medical concerns today and reports she is doing "good'  Patient is able to verify HIPAA, DOB and Address Reviewedthe reason for the call to follow up on Nepro, resources sent and further assessment forcomplex after her d/c from [facility] She voiced understanding   Consent:[facility]N RN CM reviewed [facility] services with patient. Patient gave verbal consent for [facility] telephonic RN CM services. Advised patient that other post discharge calls may occur to assess how the patient is doing following the recent hospitalization. Patient voiced understanding and was appreciative of f/u call. DesignatedParty release form dated 06/04/15 indicates all [facility] practice staff can speak with Sabrina Beck and Sabrina Beck, her sons, per pt permission   Assessment ant education on major medical conditions [name] wanted to review her hospital d/c diagnoses. Her after summary reviewed CHF  [facility] RN CM reviewed the listed diagnoses (pneumonia, sepsis, colitis, atrial fibrillation) listed in her d/c summary.  [facility] RN CM discussed the differences in viral and bacteria pneumonia She had a PNA shot in 2015 [facility] RN CM  discussed with her that she is able to get it every 5 years Discussed how smoking increases the risks of PNA [facility] RN CM discussed sepsis related to her pneumonia and hypotension noted in the hospital.  [facility] RN CM discussed colitis, referred her to the CT abdomen/[pelvic results during her hospital visit and also discussed GERD. She reports she takes Prilosec but has had some episodes of having "things go down the wrong way" [facility] RN CM taught her how to do deep breathing and coughing to avoid repeated lung infections. Cm discussed incentive spirometer use. She voiced understanding [facility]N RN CM discussed Atrial fibrillation (Afib), her home plan and reviewed Afib action plan. Pt voiced interest in purchasing a BP cuff or pulse oximeter to assist with the check of her heart rate She was encouraged to check with the [facility] customer service to see if she has an OTC program as she reports she does not receive catalogs. She states she has had Afib for years She is familiar with sob when "I am rushing"  [facility] RN CM discussed CHF and the CHF action plan She does have a scale and attempts to check daily but admits at time she forgets. 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 anticoagulation with Coumadin, chronicsystolic anddiastolic CHF,HTN, GERD,mixedHLD, Pt reports she was a former smokermacular degenerative disease, CKD stage 5, ESRD on dialysis Monday, Wednesday, Friday, malignant neoplasm of uterus, coagulopathy, anemia  Covid negativeon 04/08/19  FallsDenied by pt but 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  LKJ:ZPHX , eyeglasses, scales  Medications:Shedenies concerns with taking medications as prescribed, affording medications, side effects of medications and questions about medications   Upcoming appointments [facility] Mondays, Wednesdays and Fridays- next 05/14/19 Wednesday Cardiology [name] 06/05/19 - [name] states this appointment is a rescheduled appointment   Plan: [facility] RN CMfollow up with [name] within 21-28 business days to continue complex CM services  Pt encouraged to return a call to [facility] RN CM prn  Routed note to MDs  MD involvement barriers letter sent   [facility] CM Care Plan Problem One     Most Recent Value  Care Plan Problem One  Risk for hospital readmission secondary to recently diagnosed CHF  Role Documenting the Problem One  Care Management Telephonic Coordinator  Care Plan for Problem One  Active  THN Long Term Goal   Over the next 31 days, patient will not experience hospital readmission, as evidenced by patient reporting and review of EMR during [facility] RN CCM outreach  [facility] Long Term Goal Start Date  04/28/19  [facility] Long Term Goal Met Date  05/27/19  THN CM Short Term Goal #1   Over the next 7 days, patient will  schedule hospital follow up office visit appointments with cardiologisT, as evidenced by patient reporting during [facility] RN CCM outreach  [facility] CM Short Term Goal #1 Start Date  04/28/19  [facility] CM Short Term Goal #1 Met Date  05/06/19  THN CM Short Term Goal #2   over the next 14-21 days patient Nepro discount information will be offiered to the patient so she can take as ordered as evidence by her verbalizing that Nepro obtained during contact with her  [facility] CM Short Term Goal #2 Start Date  04/28/19  Interventions for Short Term Goal #2  assessed for nepro     [facility] CM Care Plan Problem Two     Most Recent Value  Care Plan Problem Two  Knowledge deficit of major medical conditions, PNA, Colitis, AFib, CHF  Role Documenting the Problem Two  Care Management Telephonic Coordinator  Care Plan for Problem Two  Active  Interventions for Problem Two Long Term Goal   Assesed for home care of major medical diagnoses, reviwed action plans and DME needs for home care, answered questions, EMMI materials mailed   [facility] Long Term Goal  over the next 30 days patient will be able to verbalize understanding of home care for major medical diagnoses as verbalized during follow up calls  [facility] Long Term Goal Start Date  05/27/19  [facility] CM Short Term Goal #1   over the next 21 days patient will be  able to verbalize 2-3 home interventions for managment of her major medical diagnoses with follow up call  [facility] CM Short Term Goal #1 Start Date  05/27/19  Interventions for Short Term Goal #2   Assesed for home care of major medical diagnoses, reviwed action plans and DME needs for home care, answered questions, EMMI materials mailed , discussed  and offered [facility] health coach services       Kimberly L. Lavina Hamman, RN, BSN, [place] Coordinator Office number [phone] Mobile number [phone]  Main [facility] number [phone] Fax number [phone]

## 2019-05-28 ENCOUNTER — Ambulatory Visit (INDEPENDENT_AMBULATORY_CARE_PROVIDER_SITE_OTHER): Payer: Medicare HMO | Admitting: *Deleted

## 2019-05-28 ENCOUNTER — Telehealth: Payer: Self-pay | Admitting: Cardiovascular Disease

## 2019-05-28 DIAGNOSIS — Z5181 Encounter for therapeutic drug level monitoring: Secondary | ICD-10-CM | POA: Diagnosis not present

## 2019-05-28 DIAGNOSIS — Z23 Encounter for immunization: Secondary | ICD-10-CM | POA: Diagnosis not present

## 2019-05-28 DIAGNOSIS — I5042 Chronic combined systolic (congestive) and diastolic (congestive) heart failure: Secondary | ICD-10-CM | POA: Diagnosis not present

## 2019-05-28 DIAGNOSIS — K219 Gastro-esophageal reflux disease without esophagitis: Secondary | ICD-10-CM | POA: Diagnosis not present

## 2019-05-28 DIAGNOSIS — I4821 Permanent atrial fibrillation: Secondary | ICD-10-CM

## 2019-05-28 DIAGNOSIS — I132 Hypertensive heart and chronic kidney disease with heart failure and with stage 5 chronic kidney disease, or end stage renal disease: Secondary | ICD-10-CM | POA: Diagnosis not present

## 2019-05-28 DIAGNOSIS — Z992 Dependence on renal dialysis: Secondary | ICD-10-CM | POA: Diagnosis not present

## 2019-05-28 DIAGNOSIS — N186 End stage renal disease: Secondary | ICD-10-CM | POA: Diagnosis not present

## 2019-05-28 DIAGNOSIS — E782 Mixed hyperlipidemia: Secondary | ICD-10-CM | POA: Diagnosis not present

## 2019-05-28 DIAGNOSIS — J181 Lobar pneumonia, unspecified organism: Secondary | ICD-10-CM | POA: Diagnosis not present

## 2019-05-28 DIAGNOSIS — J9601 Acute respiratory failure with hypoxia: Secondary | ICD-10-CM | POA: Diagnosis not present

## 2019-05-28 DIAGNOSIS — T80818D Extravasation of other vesicant agent, subsequent encounter: Secondary | ICD-10-CM | POA: Diagnosis not present

## 2019-05-28 DIAGNOSIS — D631 Anemia in chronic kidney disease: Secondary | ICD-10-CM | POA: Diagnosis not present

## 2019-05-28 LAB — POCT INR: INR: 1.9 — AB (ref 2.0–3.0)

## 2019-05-28 NOTE — Patient Instructions (Signed)
Take warfarin 2 tablets tonight then resume 1 1/2 tablets daily except 2 tablets on Mondays and Thursdays  Recheck on 06/05/2019 by [facility]. Dialysis on M,W,F [facility] to [facility]

## 2019-05-28 NOTE — Telephone Encounter (Signed)
INR 1.9  °

## 2019-05-28 NOTE — Telephone Encounter (Signed)
Done.  See coumadin note. 

## 2019-05-29 DIAGNOSIS — Z6839 Body mass index (BMI) 39.0-39.9, adult: Secondary | ICD-10-CM | POA: Diagnosis not present

## 2019-05-29 DIAGNOSIS — S51801D Unspecified open wound of right forearm, subsequent encounter: Secondary | ICD-10-CM | POA: Diagnosis not present

## 2019-05-30 ENCOUNTER — Other Ambulatory Visit (HOSPITAL_COMMUNITY): Payer: Self-pay | Admitting: Internal Medicine

## 2019-05-30 DIAGNOSIS — Z992 Dependence on renal dialysis: Secondary | ICD-10-CM | POA: Diagnosis not present

## 2019-05-30 DIAGNOSIS — Z1231 Encounter for screening mammogram for malignant neoplasm of breast: Secondary | ICD-10-CM

## 2019-05-30 DIAGNOSIS — Z23 Encounter for immunization: Secondary | ICD-10-CM | POA: Diagnosis not present

## 2019-05-30 DIAGNOSIS — N186 End stage renal disease: Secondary | ICD-10-CM | POA: Diagnosis not present

## 2019-06-02 DIAGNOSIS — Z23 Encounter for immunization: Secondary | ICD-10-CM | POA: Diagnosis not present

## 2019-06-02 DIAGNOSIS — Z992 Dependence on renal dialysis: Secondary | ICD-10-CM | POA: Diagnosis not present

## 2019-06-02 DIAGNOSIS — N186 End stage renal disease: Secondary | ICD-10-CM | POA: Diagnosis not present

## 2019-06-02 NOTE — Progress Notes (Signed)
Cardiology Office Note:    Date:  06/05/2019   ID:  Sabrina Beck, DOB September 17, 1940, MRN [id number]  PCP:  Sabrina School, MD  Cardiologist:  Sabrina Klein, MD   Referring MD: Sabrina School, MD   Chief Complaint  Patient presents with  . Hospitalization Follow-up    Afib    History of Present Illness:    Sabrina Beck is a 78 y.o. female with ESRD on HD, permanent Afib on coumadin, chronic systolic and diastolic heart failure, HTN, and GERD. She was recently seen in consult in the hospital for Afib RVR in the setting of acute illness with HCAP and respiratory failure.  She required IV esmolol and IV amiodarone for rate control. She was weaned off both drips and discharged on lopressor 25 mg BID (has been on lopressor 200 mg BID in the past for HR control, now with marginal pressures requiring midodrine prior to HD). Echo that admission with EF of 45%.  She presents today for follow up. Her son is with her and helps with history.  She is feeling much better since discharge, although she does not have much recollection of her hospitalization. She is doing well on 25 mg lopressor BID and INR yesterday was 2.5. Her main complaint is about neuropathy in her feet and hands. She is on a very low dose of gabapentin. She will speak with her new neurologist to see if this can be increased. She had albuterol inhaler to use PRN, but has not used this. We discussed that if she started using albuterol regularly that we may need to switch her to xopenex. She has transitioned from her walker to a cane and mobility is improving.   Past Medical History:  Diagnosis Date  . Anemia   . Atrial fibrillation ([place])   . Chest pain 05/15/2006   Stress test negative for ischemia  . Diastolic heart failure ([place])   . Diverticulosis   . ESRD on hemodialysis ([place])   . GERD (gastroesophageal reflux disease)   . Headache   . Mixed hyperlipidemia   . Peripheral neuropathy   . PONV (postoperative nausea and  vomiting)   . Systemic hypertension   . Uterine cancer [facility])     Past Surgical History:  Procedure Laterality Date  . ABDOMINAL HYSTERECTOMY    . APPENDECTOMY    . AV FISTULA PLACEMENT Left 11/17/2013   Procedure: CREATION OF LEFT RADIOCEPHALIC ARTERIOVENOUS (AV) FISTULA ;  Surgeon: [name], MD;  Location: [place];  Service: Vascular;  Laterality: Left;  . BREAST SURGERY Left    tumors non canerous 2- 3 different surgeries  . CATARACT EXTRACTION W/PHACO Right 10/26/2016   Procedure: CATARACT EXTRACTION PHACO AND INTRAOCULAR LENS PLACEMENT (IOC) CDE= 11.91;  Surgeon: Tonny Branch, MD;  Location: [facility];  Service: Ophthalmology;  Laterality: Right;  right - pt knows to arrive at 6:30  . CATARACT EXTRACTION W/PHACO Left 11/30/2016   Procedure: CATARACT EXTRACTION PHACO AND INTRAOCULAR LENS PLACEMENT (IOC) CDE - 7.92 ;  Surgeon: Tonny Branch, MD;  Location: [facility];  Service: Ophthalmology;  Laterality: Left;  left  . CHOLECYSTECTOMY    . COLONOSCOPY W/ BIOPSIES AND POLYPECTOMY    . FISTULA SUPERFICIALIZATION Left 05/19/2014   Procedure: FISTULA SUPERFICIALIZATION-LEFT ARM;  Surgeon: [name], MD;  Location: [place];  Service: Vascular;  Laterality: Left;  . INSERTION OF DIALYSIS CATHETER N/A 01/24/2014   Procedure: INSERTION OF DIALYSIS CATHETER;  Surgeon: Rosetta Posner, MD;  Location: [place];  Service: Vascular;  Laterality: N/A;  . KNEE ARTHROSCOPY    . TUBAL LIGATION      Current Medications: Current Meds  Medication Sig  . acetaminophen (TYLENOL) 500 MG tablet Take 1,000 mg by mouth every 6 (six) hours as needed for moderate pain.   Marland Kitchen albuterol (VENTOLIN HFA) 108 (90 Base) MCG/ACT inhaler Inhale 2 puffs into the lungs every 4 (four) hours as needed for wheezing or shortness of breath (cough, shortness of breath or wheezing.).  Marland Kitchen cephALEXin (KEFLEX) 500 MG capsule Take 500 mg by mouth 2 (two) times daily.  . Cholecalciferol (VITAMIN D) 2000 UNITS CAPS Take 2,000 Units by mouth  every evening.   . fluticasone (FLOVENT HFA) 44 MCG/ACT inhaler Inhale 2 puffs into the lungs 2 (two) times daily.  Marland Kitchen gabapentin (NEURONTIN) 100 MG capsule Take 100 mg by mouth 2 (two) times daily.   . metoprolol tartrate (LOPRESSOR) 25 MG tablet Take 1 tablet (25 mg total) by mouth 2 (two) times daily.  . Multiple Vitamins-Minerals (PRESERVISION AREDS 2 PO) Take 1 tablet by mouth 2 (two) times daily.  . multivitamin (RENA-VIT) TABS tablet Take 1 tablet by mouth every evening.   . Omega-3 Fatty Acids (OMEGA-3 FISH OIL) 300 MG CAPS Take 300 mg by mouth 2 (two) times daily.  Marland Kitchen omeprazole (PRILOSEC) 20 MG capsule Take 20 mg by mouth every morning.   Marland Kitchen Propylene Glycol (SYSTANE BALANCE) 0.6 % SOLN Place 1 drop into both eyes 3 (three) times daily as needed (for burning or dry eyes).   . RENVELA 800 MG tablet Take 2,400 mg by mouth 3 (three) times daily with meals. Takes 1 tablet with snacks  . SSD 1 % cream APPLY A THICK LAYER OF CREAM TO ENTIRE WOUND AREA ONCE A DAY  . warfarin (COUMADIN) 5 MG tablet Take 1.5 to 2 tablets by mouth daily as directed by coumadin clinic (Patient taking differently: Take 7.5-10 mg by mouth See admin instructions. 10mg  (10mg  total) on Mondays, then take 7.5mg  (1.5 tablets) on all other days in the evening.)     Allergies:   Patient has no known allergies.   Social History   Socioeconomic History  . Marital status: Widowed    Spouse name: Not on file  . Number of children: 2  . Years of education: Not on file  . Highest education level: [employer] graduate  Occupational History  . Occupation: retired Oncologist   Social Needs  . Financial resource strain: Not on file  . Food insecurity    Worry: Not on file    Inability: Not on file  . Transportation needs    Medical: No    Non-medical: No  Tobacco Use  . Smoking status: Former Smoker    Quit date: 08/08/1999    Years since quitting: 19.8  . Smokeless tobacco: Never Used  Substance and Sexual  Activity  . Alcohol use: No    Alcohol/week: 0.0 standard drinks  . Drug use: No  . Sexual activity: Not Currently  Lifestyle  . Physical activity    Days per week: Not on file    Minutes per session: Not on file  . Stress: Not on file  Relationships  . Social Herbalist on phone: Not on file    Gets together: Not on file    Attends religious service: Not on file    Active member of club or organization: Not on file    Attends meetings of clubs or  organizations: Not on file    Relationship status: Widowed  Other Topics Concern  . Not on file  Social History Narrative   Lives in a one story home.  One of her sons lives with her.  Has 2 sons.  Retired Oncologist.  Eduction: high [employer].     Family History: The patient's family history includes Cancer in her brother; Diabetes in her brother, father, mother, and sister; Heart attack in her brother and father; Heart disease in her brother and father; Hyperlipidemia in her sister; Hypertension in her brother, mother, and sister. There is no history of Colon cancer.  ROS:   Please see the history of present illness.     All other systems reviewed and are negative.  EKGs/Labs/Other Studies Reviewed:    The following studies were reviewed today:  Echo 08/22/18: Study Conclusions - Left ventricle: The cavity size was normal. Wall thickness was   increased in a pattern of moderate LVH. The estimated ejection   fraction was 45%. There is hypokinesis of the midinferoseptal   myocardium. The study was not technically sufficient to allow   evaluation of LV diastolic dysfunction due to atrial   fibrillation. - Aortic valve: Mildly calcified annulus. Trileaflet; mildly   calcified leaflets. - Mitral valve: Mildly calcified annulus. There was mild   regurgitation. - Left atrium: The atrium was moderately dilated. - Right atrium: The atrium was mildly to moderately dilated. - Atrial septum: No defect or patent foramen  ovale was identified. - Tricuspid valve: There was mild regurgitation. - Pulmonary arteries: PA peak pressure: 21 mm Hg (S). - Pericardium, extracardiac: There was no pericardial effusion.   EKG:  EKG is ordered today.  The ekg ordered today demonstrates atrial fibrillation with ventricular rate 97, anteroseptal Q waves (old)  Recent Labs: 04/08/2019: B Natriuretic Peptide 344.0 04/09/2019: ALT 22 04/24/2019: Hemoglobin 9.3; Magnesium 2.0; Platelets 204 04/25/2019: BUN 30; Creatinine, Ser 7.12; Potassium 3.3; Sodium 141  Recent Lipid Panel    Component Value Date/Time   CHOL 87 (L) 06/08/2015 0735   TRIG 272 (H) 06/08/2015 0735   HDL 25 (L) 06/08/2015 0735   LDLCALC 8 06/08/2015 0735    Physical Exam:    VS:  BP 138/77   Pulse 96   Temp (!) 97.1 F (36.2 C)   Ht 5\' 4"  (1.626 m)   Wt 220 lb (99.8 kg)   SpO2 95%   BMI 37.76 kg/m     Wt Readings from Last 3 Encounters:  06/05/19 220 lb (99.8 kg)  05/06/19 218 lb (98.9 kg)  04/25/19 216 lb 0.8 oz (98 kg)     GEN:  Well nourished, well developed in no acute distress HEENT: Normal NECK: No JVD; No carotid bruits LYMPHATICS: No lymphadenopathy CARDIAC: irregular rhythm, regular rate RESPIRATORY:  Clear to auscultation without rales, wheezing or rhonchi  ABDOMEN: Soft, non-tender, non-distended MUSCULOSKELETAL:  trace edema; Left UE fistula SKIN: Warm and dry NEUROLOGIC:  Alert and oriented x 3 PSYCHIATRIC:  Normal affect   ASSESSMENT:    1. Permanent atrial fibrillation ([place])   2. ESRD (end stage renal disease) ([place])   3. Chronic combined systolic and diastolic CHF, NYHA class 2 ([place])   4. Chronic anticoagulation    PLAN:    In order of problems listed above:  Permanent atrial fibrillation ([place]) - Plan: EKG 12-Lead Chronic anticoagulation Rate controlled today. HR improved to the 80s on recheck. She feels well. INR is 2.5. Continue 25 mg lopressor BID.  ESRD (end stage renal disease) ([place]) On HD Per  nephrology.   Chronic combined systolic and diastolic CHF, NYHA class 2 ([place]) She has chronic orthopnea. Fluid status managed with HD.  Continue BB   Follow up in person with Dr. Sallyanne Kuster in 6 months.  She declined virtual visit when I offered.    Medication Adjustments/Labs and Tests Ordered: Current medicines are reviewed at length with the patient today.  Concerns regarding medicines are outlined above.  Orders Placed This Encounter  Procedures  . EKG 12-Lead   No orders of the defined types were placed in this encounter.   Signed, Ledora Bottcher, PA  06/05/2019 4:09 PM    [facility]

## 2019-06-04 ENCOUNTER — Ambulatory Visit (INDEPENDENT_AMBULATORY_CARE_PROVIDER_SITE_OTHER): Payer: Medicare HMO | Admitting: *Deleted

## 2019-06-04 ENCOUNTER — Telehealth: Payer: Self-pay | Admitting: *Deleted

## 2019-06-04 DIAGNOSIS — E782 Mixed hyperlipidemia: Secondary | ICD-10-CM | POA: Diagnosis not present

## 2019-06-04 DIAGNOSIS — I4891 Unspecified atrial fibrillation: Secondary | ICD-10-CM

## 2019-06-04 DIAGNOSIS — Z992 Dependence on renal dialysis: Secondary | ICD-10-CM | POA: Diagnosis not present

## 2019-06-04 DIAGNOSIS — D631 Anemia in chronic kidney disease: Secondary | ICD-10-CM | POA: Diagnosis not present

## 2019-06-04 DIAGNOSIS — J9601 Acute respiratory failure with hypoxia: Secondary | ICD-10-CM | POA: Diagnosis not present

## 2019-06-04 DIAGNOSIS — Z5181 Encounter for therapeutic drug level monitoring: Secondary | ICD-10-CM | POA: Diagnosis not present

## 2019-06-04 DIAGNOSIS — T80818D Extravasation of other vesicant agent, subsequent encounter: Secondary | ICD-10-CM | POA: Diagnosis not present

## 2019-06-04 DIAGNOSIS — J181 Lobar pneumonia, unspecified organism: Secondary | ICD-10-CM | POA: Diagnosis not present

## 2019-06-04 DIAGNOSIS — Z23 Encounter for immunization: Secondary | ICD-10-CM | POA: Diagnosis not present

## 2019-06-04 DIAGNOSIS — I5042 Chronic combined systolic (congestive) and diastolic (congestive) heart failure: Secondary | ICD-10-CM | POA: Diagnosis not present

## 2019-06-04 DIAGNOSIS — K219 Gastro-esophageal reflux disease without esophagitis: Secondary | ICD-10-CM | POA: Diagnosis not present

## 2019-06-04 DIAGNOSIS — I132 Hypertensive heart and chronic kidney disease with heart failure and with stage 5 chronic kidney disease, or end stage renal disease: Secondary | ICD-10-CM | POA: Diagnosis not present

## 2019-06-04 DIAGNOSIS — N186 End stage renal disease: Secondary | ICD-10-CM | POA: Diagnosis not present

## 2019-06-04 DIAGNOSIS — I4821 Permanent atrial fibrillation: Secondary | ICD-10-CM | POA: Diagnosis not present

## 2019-06-04 LAB — POCT INR: INR: 2.5 (ref 2.0–3.0)

## 2019-06-04 NOTE — Telephone Encounter (Signed)
[facility] [phone]  INR 2.5  FYI  Patient started taking Cephalexin 500 mg b.i.d. on 05/29/2019

## 2019-06-04 NOTE — Telephone Encounter (Signed)
Done.  See coumadin note. 

## 2019-06-04 NOTE — Patient Instructions (Signed)
Continue warfarin 1 1/2 tablets daily except 2 tablets on Mondays and Thursdays  Recheck in 1 week by [facility]. Dialysis on M,W,F Order given to Inoc[facility]

## 2019-06-05 ENCOUNTER — Other Ambulatory Visit: Payer: Self-pay

## 2019-06-05 ENCOUNTER — Telehealth: Payer: Self-pay | Admitting: Physician Assistant

## 2019-06-05 ENCOUNTER — Encounter: Payer: Self-pay | Admitting: Physician Assistant

## 2019-06-05 ENCOUNTER — Ambulatory Visit: Payer: Medicare HMO | Admitting: Physician Assistant

## 2019-06-05 VITALS — BP 138/77 | HR 96 | Temp 97.1°F | Ht 64.0 in | Wt 220.0 lb

## 2019-06-05 DIAGNOSIS — N186 End stage renal disease: Secondary | ICD-10-CM

## 2019-06-05 DIAGNOSIS — I5042 Chronic combined systolic (congestive) and diastolic (congestive) heart failure: Secondary | ICD-10-CM

## 2019-06-05 DIAGNOSIS — I4821 Permanent atrial fibrillation: Secondary | ICD-10-CM | POA: Diagnosis not present

## 2019-06-05 DIAGNOSIS — Z7901 Long term (current) use of anticoagulants: Secondary | ICD-10-CM

## 2019-06-05 NOTE — Telephone Encounter (Signed)
LMTCB. Per Angie, PA---would like pt to come in around 3:15 if she is able. LM on son, Leandrew Koyanagi as well, ok per DPR.

## 2019-06-05 NOTE — Telephone Encounter (Signed)
Heard back from pt son who stated he will be able to bring pt into the office early at 3:15 pm.

## 2019-06-05 NOTE — Patient Instructions (Addendum)
Medication Instructions:  Your physician recommends that you continue on your current medications as directed. Please refer to the Current Medication list given to you today.  *If you need a refill on your cardiac medications before your next appointment, please call your pharmacy*   Follow-Up: At [facility], you and your health needs are our priority.  As part of our continuing mission to provide you with exceptional heart care, we have created designated Provider Care Teams.  These Care Teams include your primary Cardiologist (physician) and Advanced Practice Providers (APPs -  Physician Assistants and Nurse Practitioners) who all work together to provide you with the care you need, when you need it.  Your next appointment:   6 months  Please call out office in 4 months (February 2021) to schedule your 6 month (April) follow-up appointment.  The format for your next appointment:   In Person  Provider:   You may see Sanda Klein, MD or one of the following Advanced Practice Providers on your designated Care Team:    Almyra Deforest, PA-C  Fabian Sharp, PA-C or   Roby Lofts, [place]

## 2019-06-06 DIAGNOSIS — N186 End stage renal disease: Secondary | ICD-10-CM | POA: Diagnosis not present

## 2019-06-06 DIAGNOSIS — Z23 Encounter for immunization: Secondary | ICD-10-CM | POA: Diagnosis not present

## 2019-06-06 DIAGNOSIS — Z992 Dependence on renal dialysis: Secondary | ICD-10-CM | POA: Diagnosis not present

## 2019-06-07 DIAGNOSIS — N186 End stage renal disease: Secondary | ICD-10-CM | POA: Diagnosis not present

## 2019-06-07 DIAGNOSIS — Z992 Dependence on renal dialysis: Secondary | ICD-10-CM | POA: Diagnosis not present

## 2019-06-09 DIAGNOSIS — Z992 Dependence on renal dialysis: Secondary | ICD-10-CM | POA: Diagnosis not present

## 2019-06-09 DIAGNOSIS — N186 End stage renal disease: Secondary | ICD-10-CM | POA: Diagnosis not present

## 2019-06-09 DIAGNOSIS — N2581 Secondary hyperparathyroidism of renal origin: Secondary | ICD-10-CM | POA: Diagnosis not present

## 2019-06-11 ENCOUNTER — Ambulatory Visit (INDEPENDENT_AMBULATORY_CARE_PROVIDER_SITE_OTHER): Payer: Medicare HMO | Admitting: *Deleted

## 2019-06-11 ENCOUNTER — Telehealth: Payer: Self-pay | Admitting: *Deleted

## 2019-06-11 DIAGNOSIS — I4821 Permanent atrial fibrillation: Secondary | ICD-10-CM

## 2019-06-11 DIAGNOSIS — E782 Mixed hyperlipidemia: Secondary | ICD-10-CM | POA: Diagnosis not present

## 2019-06-11 DIAGNOSIS — K219 Gastro-esophageal reflux disease without esophagitis: Secondary | ICD-10-CM | POA: Diagnosis not present

## 2019-06-11 DIAGNOSIS — D631 Anemia in chronic kidney disease: Secondary | ICD-10-CM | POA: Diagnosis not present

## 2019-06-11 DIAGNOSIS — J9601 Acute respiratory failure with hypoxia: Secondary | ICD-10-CM | POA: Diagnosis not present

## 2019-06-11 DIAGNOSIS — J181 Lobar pneumonia, unspecified organism: Secondary | ICD-10-CM | POA: Diagnosis not present

## 2019-06-11 DIAGNOSIS — Z5181 Encounter for therapeutic drug level monitoring: Secondary | ICD-10-CM

## 2019-06-11 DIAGNOSIS — I132 Hypertensive heart and chronic kidney disease with heart failure and with stage 5 chronic kidney disease, or end stage renal disease: Secondary | ICD-10-CM | POA: Diagnosis not present

## 2019-06-11 DIAGNOSIS — Z992 Dependence on renal dialysis: Secondary | ICD-10-CM | POA: Diagnosis not present

## 2019-06-11 DIAGNOSIS — T80818D Extravasation of other vesicant agent, subsequent encounter: Secondary | ICD-10-CM | POA: Diagnosis not present

## 2019-06-11 DIAGNOSIS — N186 End stage renal disease: Secondary | ICD-10-CM | POA: Diagnosis not present

## 2019-06-11 DIAGNOSIS — I5042 Chronic combined systolic (congestive) and diastolic (congestive) heart failure: Secondary | ICD-10-CM | POA: Diagnosis not present

## 2019-06-11 LAB — POCT INR: INR: 2.3 (ref 2.0–3.0)

## 2019-06-11 NOTE — Telephone Encounter (Signed)
Done.  See coumadin note. 

## 2019-06-11 NOTE — Telephone Encounter (Signed)
Janett Billow with [place]    [phone])   PT 27.2 INR 2.3

## 2019-06-11 NOTE — Patient Instructions (Signed)
Continue warfarin 1 1/2 tablets daily except 2 tablets on Mondays and Thursdays  Recheck in 1 week by [facility]. Dialysis on M,W,F Order given to [facility]

## 2019-06-13 DIAGNOSIS — Z992 Dependence on renal dialysis: Secondary | ICD-10-CM | POA: Diagnosis not present

## 2019-06-13 DIAGNOSIS — N186 End stage renal disease: Secondary | ICD-10-CM | POA: Diagnosis not present

## 2019-06-16 DIAGNOSIS — N186 End stage renal disease: Secondary | ICD-10-CM | POA: Diagnosis not present

## 2019-06-16 DIAGNOSIS — D509 Iron deficiency anemia, unspecified: Secondary | ICD-10-CM | POA: Diagnosis not present

## 2019-06-16 DIAGNOSIS — N2581 Secondary hyperparathyroidism of renal origin: Secondary | ICD-10-CM | POA: Diagnosis not present

## 2019-06-16 DIAGNOSIS — Z992 Dependence on renal dialysis: Secondary | ICD-10-CM | POA: Diagnosis not present

## 2019-06-17 ENCOUNTER — Other Ambulatory Visit: Payer: Self-pay

## 2019-06-17 ENCOUNTER — Other Ambulatory Visit: Payer: Self-pay | Admitting: *Deleted

## 2019-06-17 DIAGNOSIS — Z0100 Encounter for examination of eyes and vision without abnormal findings: Secondary | ICD-10-CM | POA: Diagnosis not present

## 2019-06-17 NOTE — Patient Outreach (Addendum)
[facility]) Care Management  06/17/2019  TANELLE LANZO 1940/12/31 [id number]   Telephoneassessmentfor Complex CMReferral- week4  Referral Date:04/28/19 Referral Source:[facility] hospital liaison, Rochele Raring for consult Post hospital follow up   Diagnoses of Heart Failure,COPD/ Pneumonia  Date of Admission:04/08/19 Diagnosis:acute hypoxic respiratory failure with hypoxia due to lobar pneumonia as well as pulmonary edema, sepsis due to HCAP and Colitis,Afibwith RVR Date of Discharge:04/25/19 Facility:[facility] Insurance:AetnamedicareHMO/PPO  Transition of care services noted to be completed by primary care MD office staff [facility] Dr Redmond School  Sabrina Amorie Rentz has been out of the hospital for 53days   Patient is able to verify HIPAA, DOB and Address Reviewedthe reason for the call to follow up on Nepro, resources sent and further assessment for [facility] health coach services She voiced understanding   Consent:[facility] RN CM reviewed [facility] services with patient. Patient gave verbal consent for [facility] telephonic RN CM services.  Over the counter benefit program Sabrina Rigdon voiced interest in getting a new BP cuff. [facility] RN CM shared with her the cost of a BP cuff from [facility] and also the [employer] over the counter benefit program health solutions. She was encouraged to call her customer services number on the back of her insurance card  She denies any medical concerns today and reports she is doing "good' Assessed the completion of the Nepro supplement assistance program application by her and the HD staff  She reports it has not been completed. 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 anticoagulation with Coumadin, chronicsystolic anddiastolic CHF,HTN, GERD,mixedHLD, Pt reports she was a former smokermacular degenerative disease, CKD stage 5, ESRD on dialysis Monday, Wednesday, Friday, malignant neoplasm of uterus, coagulopathy, anemia  Covid negativeon 04/08/19  FallsDenied by pt but 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  TGY:BWLS , eyeglasses, scales, walker   Medications:Shedenies concerns with taking medications as prescribed, affording medications, side effects of medications and questions about medications   Upcoming appointments [facility] Mondays, Wednesdays and Fridays- next 05/14/19 Wednesday Cardiology [name] 06/05/19 - Sabrina Towanda Octave states this appointment is a rescheduled appointment  Plans  [facility] RN CM will close case at this time as patient has been assessed and no further needs identified/needs resolved. Pt prefers not to be transferred to [facility] health coach services at this time   Pt encouraged to  return a call to [place] CM prn  Routed note to MDs/NP/PA  [facility] CM Care Plan  Problem One     Most Recent Value  Care Plan Problem One  Risk for hospital readmission secondary to recently diagnosed CHF  Role Documenting the Problem One  Care Management Telephonic Coordinator  Care Plan for Problem One  Active  [facility] Long Term Goal   Over the next 31 days, patient will not experience hospital readmission, as evidenced by patient reporting and review of EMR during [facility] RN CCM outreach  [place] Term Goal Start Date  04/28/19  [facility] Long Term Goal Met Date  05/27/19  [facility] CM Short Term Goal #1   Over the next 7 days, patient will schedule hospital follow up office visit appointments with cardiologisT, as evidenced by patient reporting during [facility] RN CCM outreach  [facility] CM Short Term Goal #1 Start Date  04/28/19  [facility] CM Short Term Goal #1 Met Date  05/06/19  [facility] CM Short Term Goal #2   over the next 14-21 days patient Nepro discount information will be offiered to the patient so she can take as ordered as evidence by her verbalizing that Nepro obtained during contact with her  [facility] CM Short Term Goal #2 Start Date  04/28/19  [facility] CM Short Term Goal #2 Met Date  06/17/19    [facility] CM Care Plan Problem Two     Most Recent Value  Care Plan Problem Two  Knowledge deficit of major medical conditions, PNA, Colitis, AFib, CHF  Role Documenting the Problem Two  Care Management Telephonic [place] for Problem Two  Active  [facility] Long Term Goal  over the next 30 days patient will be able to verbalize understanding of home care for major medical diagnoses as verbalized during follow up calls  [place] Goal Start Date  05/27/19  [facility] Long Term Goal Met Date  06/17/19  [facility] CM Short Term Goal #1   over the next 21 days patient will be able to verbalize 2-3 home interventions for managment of her major medical diagnoses with follow up call  [facility] CM Short Term Goal #1 Start Date  05/27/19  [facility] CM Short Term Goal #1 Met Date   06/17/19      Joelene Millin L. Lavina Hamman, RN, BSN, [place] Coordinator Office number [phone] Mobile number [phone]  Main [facility] number [phone] Fax number [phone]

## 2019-06-18 ENCOUNTER — Ambulatory Visit (INDEPENDENT_AMBULATORY_CARE_PROVIDER_SITE_OTHER): Payer: Medicare HMO | Admitting: Pharmacist

## 2019-06-18 ENCOUNTER — Telehealth: Payer: Self-pay | Admitting: *Deleted

## 2019-06-18 DIAGNOSIS — N186 End stage renal disease: Secondary | ICD-10-CM | POA: Diagnosis not present

## 2019-06-18 DIAGNOSIS — I5042 Chronic combined systolic (congestive) and diastolic (congestive) heart failure: Secondary | ICD-10-CM | POA: Diagnosis not present

## 2019-06-18 DIAGNOSIS — I132 Hypertensive heart and chronic kidney disease with heart failure and with stage 5 chronic kidney disease, or end stage renal disease: Secondary | ICD-10-CM | POA: Diagnosis not present

## 2019-06-18 DIAGNOSIS — J181 Lobar pneumonia, unspecified organism: Secondary | ICD-10-CM | POA: Diagnosis not present

## 2019-06-18 DIAGNOSIS — J9601 Acute respiratory failure with hypoxia: Secondary | ICD-10-CM | POA: Diagnosis not present

## 2019-06-18 DIAGNOSIS — E782 Mixed hyperlipidemia: Secondary | ICD-10-CM | POA: Diagnosis not present

## 2019-06-18 DIAGNOSIS — Z5181 Encounter for therapeutic drug level monitoring: Secondary | ICD-10-CM | POA: Diagnosis not present

## 2019-06-18 DIAGNOSIS — I4821 Permanent atrial fibrillation: Secondary | ICD-10-CM | POA: Diagnosis not present

## 2019-06-18 DIAGNOSIS — D631 Anemia in chronic kidney disease: Secondary | ICD-10-CM | POA: Diagnosis not present

## 2019-06-18 DIAGNOSIS — I4891 Unspecified atrial fibrillation: Secondary | ICD-10-CM | POA: Diagnosis not present

## 2019-06-18 DIAGNOSIS — K219 Gastro-esophageal reflux disease without esophagitis: Secondary | ICD-10-CM | POA: Diagnosis not present

## 2019-06-18 DIAGNOSIS — T80818D Extravasation of other vesicant agent, subsequent encounter: Secondary | ICD-10-CM | POA: Diagnosis not present

## 2019-06-18 DIAGNOSIS — Z992 Dependence on renal dialysis: Secondary | ICD-10-CM | POA: Diagnosis not present

## 2019-06-18 LAB — POCT INR: INR: 2.1 (ref 2.0–3.0)

## 2019-06-18 NOTE — Telephone Encounter (Signed)
See anticoag encounter from 11/11

## 2019-06-18 NOTE — Telephone Encounter (Signed)
Sabrina Beck from [place] care  - [phone]  PT 25.7 INR 2.1

## 2019-06-20 DIAGNOSIS — N186 End stage renal disease: Secondary | ICD-10-CM | POA: Diagnosis not present

## 2019-06-20 DIAGNOSIS — Z992 Dependence on renal dialysis: Secondary | ICD-10-CM | POA: Diagnosis not present

## 2019-06-23 DIAGNOSIS — Z992 Dependence on renal dialysis: Secondary | ICD-10-CM | POA: Diagnosis not present

## 2019-06-23 DIAGNOSIS — N186 End stage renal disease: Secondary | ICD-10-CM | POA: Diagnosis not present

## 2019-06-24 DIAGNOSIS — J9601 Acute respiratory failure with hypoxia: Secondary | ICD-10-CM | POA: Diagnosis not present

## 2019-06-24 DIAGNOSIS — I4821 Permanent atrial fibrillation: Secondary | ICD-10-CM | POA: Diagnosis not present

## 2019-06-24 DIAGNOSIS — T80818D Extravasation of other vesicant agent, subsequent encounter: Secondary | ICD-10-CM | POA: Diagnosis not present

## 2019-06-24 DIAGNOSIS — E782 Mixed hyperlipidemia: Secondary | ICD-10-CM | POA: Diagnosis not present

## 2019-06-24 DIAGNOSIS — I132 Hypertensive heart and chronic kidney disease with heart failure and with stage 5 chronic kidney disease, or end stage renal disease: Secondary | ICD-10-CM | POA: Diagnosis not present

## 2019-06-24 DIAGNOSIS — J181 Lobar pneumonia, unspecified organism: Secondary | ICD-10-CM | POA: Diagnosis not present

## 2019-06-24 DIAGNOSIS — N186 End stage renal disease: Secondary | ICD-10-CM | POA: Diagnosis not present

## 2019-06-24 DIAGNOSIS — D631 Anemia in chronic kidney disease: Secondary | ICD-10-CM | POA: Diagnosis not present

## 2019-06-24 DIAGNOSIS — K219 Gastro-esophageal reflux disease without esophagitis: Secondary | ICD-10-CM | POA: Diagnosis not present

## 2019-06-24 DIAGNOSIS — I5042 Chronic combined systolic (congestive) and diastolic (congestive) heart failure: Secondary | ICD-10-CM | POA: Diagnosis not present

## 2019-06-25 DIAGNOSIS — N186 End stage renal disease: Secondary | ICD-10-CM | POA: Diagnosis not present

## 2019-06-25 DIAGNOSIS — Z992 Dependence on renal dialysis: Secondary | ICD-10-CM | POA: Diagnosis not present

## 2019-06-27 DIAGNOSIS — N186 End stage renal disease: Secondary | ICD-10-CM | POA: Diagnosis not present

## 2019-06-27 DIAGNOSIS — Z992 Dependence on renal dialysis: Secondary | ICD-10-CM | POA: Diagnosis not present

## 2019-06-30 DIAGNOSIS — B351 Tinea unguium: Secondary | ICD-10-CM | POA: Diagnosis not present

## 2019-06-30 DIAGNOSIS — I739 Peripheral vascular disease, unspecified: Secondary | ICD-10-CM | POA: Diagnosis not present

## 2019-06-30 DIAGNOSIS — L851 Acquired keratosis [keratoderma] palmaris et plantaris: Secondary | ICD-10-CM | POA: Diagnosis not present

## 2019-06-30 DIAGNOSIS — Z992 Dependence on renal dialysis: Secondary | ICD-10-CM | POA: Diagnosis not present

## 2019-06-30 DIAGNOSIS — N186 End stage renal disease: Secondary | ICD-10-CM | POA: Diagnosis not present

## 2019-06-30 DIAGNOSIS — L600 Ingrowing nail: Secondary | ICD-10-CM | POA: Diagnosis not present

## 2019-06-30 DIAGNOSIS — N2581 Secondary hyperparathyroidism of renal origin: Secondary | ICD-10-CM | POA: Diagnosis not present

## 2019-07-02 ENCOUNTER — Ambulatory Visit (INDEPENDENT_AMBULATORY_CARE_PROVIDER_SITE_OTHER): Payer: Medicare HMO | Admitting: *Deleted

## 2019-07-02 ENCOUNTER — Ambulatory Visit
Admission: EM | Admit: 2019-07-02 | Discharge: 2019-07-02 | Disposition: A | Discharge status: Home / Self Care | Payer: Medicare HMO | Attending: Emergency Medicine | Admitting: Emergency Medicine

## 2019-07-02 ENCOUNTER — Telehealth: Payer: Self-pay | Admitting: *Deleted

## 2019-07-02 ENCOUNTER — Other Ambulatory Visit: Payer: Self-pay

## 2019-07-02 DIAGNOSIS — Z5181 Encounter for therapeutic drug level monitoring: Secondary | ICD-10-CM

## 2019-07-02 DIAGNOSIS — L03113 Cellulitis of right upper limb: Secondary | ICD-10-CM

## 2019-07-02 DIAGNOSIS — B029 Zoster without complications: Secondary | ICD-10-CM

## 2019-07-02 DIAGNOSIS — K5790 Diverticulosis of intestine, part unspecified, without perforation or abscess without bleeding: Secondary | ICD-10-CM | POA: Diagnosis not present

## 2019-07-02 DIAGNOSIS — I132 Hypertensive heart and chronic kidney disease with heart failure and with stage 5 chronic kidney disease, or end stage renal disease: Secondary | ICD-10-CM | POA: Diagnosis not present

## 2019-07-02 DIAGNOSIS — L089 Local infection of the skin and subcutaneous tissue, unspecified: Secondary | ICD-10-CM

## 2019-07-02 DIAGNOSIS — T148XXA Other injury of unspecified body region, initial encounter: Secondary | ICD-10-CM | POA: Diagnosis not present

## 2019-07-02 DIAGNOSIS — I5042 Chronic combined systolic (congestive) and diastolic (congestive) heart failure: Secondary | ICD-10-CM | POA: Diagnosis not present

## 2019-07-02 DIAGNOSIS — D631 Anemia in chronic kidney disease: Secondary | ICD-10-CM | POA: Diagnosis not present

## 2019-07-02 DIAGNOSIS — N186 End stage renal disease: Secondary | ICD-10-CM | POA: Diagnosis not present

## 2019-07-02 DIAGNOSIS — E782 Mixed hyperlipidemia: Secondary | ICD-10-CM | POA: Diagnosis not present

## 2019-07-02 DIAGNOSIS — Z992 Dependence on renal dialysis: Secondary | ICD-10-CM | POA: Diagnosis not present

## 2019-07-02 DIAGNOSIS — I4821 Permanent atrial fibrillation: Secondary | ICD-10-CM | POA: Diagnosis not present

## 2019-07-02 DIAGNOSIS — K559 Vascular disorder of intestine, unspecified: Secondary | ICD-10-CM | POA: Diagnosis not present

## 2019-07-02 DIAGNOSIS — T80818D Extravasation of other vesicant agent, subsequent encounter: Secondary | ICD-10-CM | POA: Diagnosis not present

## 2019-07-02 DIAGNOSIS — K219 Gastro-esophageal reflux disease without esophagitis: Secondary | ICD-10-CM | POA: Diagnosis not present

## 2019-07-02 LAB — POCT INR: INR: 2.6 (ref 2.0–3.0)

## 2019-07-02 MED ORDER — VALACYCLOVIR HCL 1 G PO TABS: 1000.0000 mg | ORAL_TABLET | Freq: Three times a day (TID) | ORAL | 0 refills | Status: DC

## 2019-07-02 MED ORDER — CEPHALEXIN 500 MG PO CAPS: 500.0000 mg | ORAL_CAPSULE | Freq: Four times a day (QID) | ORAL | 0 refills | Status: DC

## 2019-07-02 MED ORDER — PREDNISONE 10 MG (21) PO TBPK: ORAL_TABLET | Freq: Every day | ORAL | 0 refills | Status: DC

## 2019-07-02 NOTE — Telephone Encounter (Signed)
Done.  See coumadin note. 

## 2019-07-02 NOTE — ED Provider Notes (Signed)
[place]   [id number] 07/02/19 Arrival Time: 1608  CC: wound check, infection, rash  SUBJECTIVE:  Sabrina Beck is a 78 y.o. female who presents with wound check.  Has noticed increased redness around wound for the past few days.  Wound was caused by IV infiltration in RT arm approximately 2 months ago, while she was hospitalized.  Home health has been managing wound, but home health concerned for infection.  Wound localized to RT forearm.  Describes it as red, but not painful.  Has tried cleaning, and keeping it covered without relief.  Denies aggravating factors.  Reports hx of wound infection in the past and has been treated with antibiotics.    Patient also complains of rash to RT low back that occurred 1 day ago.  Denies precipitating event or trauma to area.  Describes as intermittently painful and somewhat itchy in character.  Has not tried OTC medications/ topical creams.  Denies aggravating factors.  Does report hx of shingles in the past in the same location, but does not remember what it felt like.    Denies fever, chills, nausea, vomiting, swelling, discharge, SOB, chest pain, abdominal pain, changes in bowel or bladder function.    ROS: As per HPI.  All other pertinent ROS negative.     Past Medical History:  Diagnosis Date  . Anemia   . Atrial fibrillation ([place])   . Chest pain 05/15/2006   Stress test negative for ischemia  . Diastolic heart failure ([place])   . Diverticulosis   . ESRD on hemodialysis ([place])   . GERD (gastroesophageal reflux disease)   . Headache   . Mixed hyperlipidemia   . Peripheral neuropathy   . PONV (postoperative nausea and vomiting)   . Systemic hypertension   . Uterine cancer [facility])    Past Surgical History:  Procedure Laterality Date  . ABDOMINAL HYSTERECTOMY    . APPENDECTOMY    . AV FISTULA PLACEMENT Left 11/17/2013   Procedure: CREATION OF LEFT RADIOCEPHALIC ARTERIOVENOUS (AV) FISTULA ;  Surgeon: [name], MD;   Location: [place];  Service: Vascular;  Laterality: Left;  . BREAST SURGERY Left    tumors non canerous 2- 3 different surgeries  . CATARACT EXTRACTION W/PHACO Right 10/26/2016   Procedure: CATARACT EXTRACTION PHACO AND INTRAOCULAR LENS PLACEMENT (IOC) CDE= 11.91;  Surgeon: Tonny Branch, MD;  Location: [facility];  Service: Ophthalmology;  Laterality: Right;  right - pt knows to arrive at 6:30  . CATARACT EXTRACTION W/PHACO Left 11/30/2016   Procedure: CATARACT EXTRACTION PHACO AND INTRAOCULAR LENS PLACEMENT (IOC) CDE - 7.92 ;  Surgeon: Tonny Branch, MD;  Location: [facility];  Service: Ophthalmology;  Laterality: Left;  left  . CHOLECYSTECTOMY    . COLONOSCOPY W/ BIOPSIES AND POLYPECTOMY    . FISTULA SUPERFICIALIZATION Left 05/19/2014   Procedure: FISTULA SUPERFICIALIZATION-LEFT ARM;  Surgeon: [name], MD;  Location: [place];  Service: Vascular;  Laterality: Left;  . INSERTION OF DIALYSIS CATHETER N/A 01/24/2014   Procedure: INSERTION OF DIALYSIS CATHETER;  Surgeon: Rosetta Posner, MD;  Location: [facility] OR;  Service: Vascular;  Laterality: N/A;  . KNEE ARTHROSCOPY    . TUBAL LIGATION     No Known Allergies No current facility-administered medications on file prior to encounter.    Current Outpatient Medications on File Prior to Encounter  Medication Sig Dispense Refill  . acetaminophen (TYLENOL) 500 MG tablet Take 1,000 mg by mouth every 6 (six) hours as needed for moderate pain.     Marland Kitchen  albuterol (VENTOLIN HFA) 108 (90 Base) MCG/ACT inhaler Inhale 2 puffs into the lungs every 4 (four) hours as needed for wheezing or shortness of breath (cough, shortness of breath or wheezing.). 18 g 1  . Cholecalciferol (VITAMIN D) 2000 UNITS CAPS Take 2,000 Units by mouth every evening.     . fluticasone (FLOVENT HFA) 44 MCG/ACT inhaler Inhale 2 puffs into the lungs 2 (two) times daily. 1 Inhaler 2  . gabapentin (NEURONTIN) 100 MG capsule Take 100 mg by mouth 2 (two) times daily.     . metoprolol tartrate (LOPRESSOR) 25  MG tablet Take 1 tablet (25 mg total) by mouth 2 (two) times daily. 60 tablet 1  . Multiple Vitamins-Minerals (PRESERVISION AREDS 2 PO) Take 1 tablet by mouth 2 (two) times daily.    . multivitamin (RENA-VIT) TABS tablet Take 1 tablet by mouth every evening.     . Omega-3 Fatty Acids (OMEGA-3 FISH OIL) 300 MG CAPS Take 300 mg by mouth 2 (two) times daily.    Marland Kitchen omeprazole (PRILOSEC) 20 MG capsule Take 20 mg by mouth every morning.     Marland Kitchen Propylene Glycol (SYSTANE BALANCE) 0.6 % SOLN Place 1 drop into both eyes 3 (three) times daily as needed (for burning or dry eyes).     . RENVELA 800 MG tablet Take 2,400 mg by mouth 3 (three) times daily with meals. Takes 1 tablet with snacks    . SSD 1 % cream APPLY A THICK LAYER OF CREAM TO ENTIRE WOUND AREA ONCE A DAY    . warfarin (COUMADIN) 5 MG tablet Take 1.5 to 2 tablets by mouth daily as directed by coumadin clinic (Patient taking differently: Take 7.5-10 mg by mouth See admin instructions. 10mg  (10mg  total) on Mondays, then take 7.5mg  (1.5 tablets) on all other days in the evening.) 150 tablet 1   Social History   Socioeconomic History  . Marital status: Widowed    Spouse name: Not on file  . Number of children: 2  . Years of education: Not on file  . Highest education level: High school graduate  Occupational History  . Occupation: retired Oncologist   Social Needs  . Financial resource strain: Not on file  . Food insecurity    Worry: Not on file    Inability: Not on file  . Transportation needs    Medical: No    Non-medical: No  Tobacco Use  . Smoking status: Former Smoker    Quit date: 08/08/1999    Years since quitting: 19.9  . Smokeless tobacco: Never Used  Substance and Sexual Activity  . Alcohol use: No    Alcohol/week: 0.0 standard drinks  . Drug use: No  . Sexual activity: Not Currently  Lifestyle  . Physical activity    Days per week: Not on file    Minutes per session: Not on file  . Stress: Not on file  Relationships   . Social Herbalist on phone: Not on file    Gets together: Not on file    Attends religious service: Not on file    Active member of club or organization: Not on file    Attends meetings of clubs or organizations: Not on file    Relationship status: Widowed  . Intimate partner violence    Fear of current or ex partner: Not on file    Emotionally abused: Not on file    Physically abused: Not on file    Forced sexual activity:  Not on file  Other Topics Concern  . Not on file  Social History Narrative   Lives in a one story home.  One of her sons lives with her.  Has 2 sons.  Retired Oncologist.  Eduction: high school.   Family History  Problem Relation Age of Onset  . Diabetes Mother   . Hypertension Mother   . Diabetes Father   . Heart disease Father        before age 55  . Heart attack Father   . Diabetes Sister   . Hyperlipidemia Sister   . Hypertension Sister   . Cancer Brother   . Diabetes Brother   . Heart disease Brother   . Hypertension Brother   . Heart attack Brother   . Colon cancer Neg Hx     OBJECTIVE: Vitals:   07/02/19 1631  BP: 106/66  Pulse: 94  Resp: 18  Temp: 97.9 F (36.6 C)  SpO2: 96%    General appearance: alert; no distress Head: NCAT Lungs: clear to auscultation bilaterally Heart: regular rate and rhythm.  Radial pulse 2+ bilaterally Extremities: no edema Skin: warm and dry; wound present to RT posterior forearm with eschar and surrounding erythema (8 x 6 cm) (see pictures below); crops of erythematous papules over T10/L1 dermatome on the RT mid to low back, no obvious drainage or discharge, mildly TTP Psychological: alert and cooperative; normal mood and affect       ASSESSMENT & PLAN:  1. Wound infection   2. Cellulitis of right upper extremity   3. Herpes zoster without complication     Meds ordered this encounter  Medications  . valACYclovir (VALTREX) 1000 MG tablet    Sig: Take 1 tablet (1,000 mg total)  by mouth 3 (three) times daily for 10 days.    Dispense:  30 tablet    Refill:  0    Order Specific Question:   Supervising Provider    Answer:   Raylene Everts [[id number]]  . predniSONE (STERAPRED UNI-PAK 21 TAB) 10 MG (21) TBPK tablet    Sig: Take by mouth daily. Take 6 tabs by mouth daily  for 2 days, then 5 tabs for 2 days, then 4 tabs for 2 days, then 3 tabs for 2 days, 2 tabs for 2 days, then 1 tab by mouth daily for 2 days    Dispense:  42 tablet    Refill:  0    Order Specific Question:   Supervising Provider    Answer:   Raylene Everts [[id number]]  . cephALEXin (KEFLEX) 500 MG capsule    Sig: Take 1 capsule (500 mg total) by mouth 4 (four) times daily for 5 days.    Dispense:  20 capsule    Refill:  0    Order Specific Question:   Supervising Provider    Answer:   Raylene Everts [[id number]]   Wound check/ skin infection: Prescribed keflex take as directed and to completion Continue to alternate tylenol as needed for pain and fever Follow up with PCP or wound management Return or go to the ED if you have any new or worsening symptoms such as increased pain, redness, swelling, discharge, high fever, night sweats, abdominal pain, etc...  Shingles: Rest and use ice/heat as needed for symptomatic relief Prescribed valacyclovir 1000mg  3x/day for 10 days Prescribed prednisone taper for inflammation and pain Use OTC medications such as tylenol.  Follow up with PCP in 7-10 days if rash is still present  Follow up with PCP if symptoms of burning, stinging, tingling or numbness occur after rash resolves, you may need additional treatment Return here or go to ER if you have any new or worsening symptoms (such as eye involvement, severe pain, or signs of secondary infection such as fever, chills, nausea, vomiting, discharge, redness or warmth over site of rash)   Reviewed expectations re: course of current medical issues. Questions answered. Outlined signs and symptoms indicating  need for more acute intervention. Patient verbalized understanding. After Visit Summary given.   Lestine Box, PA-C 07/02/19 1739

## 2019-07-02 NOTE — Discharge Instructions (Addendum)
Skin infection: Prescribed keflex take as directed and to completion Continue to alternate tylenol as needed for pain and fever Follow up with PCP or wound management Return or go to the ED if you have any new or worsening symptoms such as increased pain, redness, swelling, discharge, high fever, night sweats, abdominal pain, etc...  Shingles: Rest and use ice/heat as needed for symptomatic relief Prescribed valacyclovir 1000mg  3x/day for 10 days Prescribed prednisone taper for inflammation and pain Use OTC medications such as tylenol.  Follow up with PCP in 7-10 days if rash is still present Follow up with PCP if symptoms of burning, stinging, tingling or numbness occur after rash resolves, you may need additional treatment Return here or go to ER if you have any new or worsening symptoms (such as eye involvement, severe pain, or signs of secondary infection such as fever, chills, nausea, vomiting, discharge, redness or warmth over site of rash)

## 2019-07-02 NOTE — Telephone Encounter (Signed)
Sabrina Beck with [facility] [phone]  INR  2.6 PT 31.4

## 2019-07-02 NOTE — ED Triage Notes (Signed)
Pt has wound from iv infiltration that occurred last month , home care nurse suggested to be rechecked due to increased redness. Pt also states she has rash on back, concerned for shingles

## 2019-07-02 NOTE — Patient Instructions (Signed)
Spoke with Janett Billow from [facility]. Continue warfarin 1 1/2 tablets daily except 2 tablets on Mondays and Thursdays Recheck in 2 week by [facility]. Dialysis on M,W,F Order given to [facility]

## 2019-07-04 ENCOUNTER — Other Ambulatory Visit: Payer: Self-pay

## 2019-07-04 ENCOUNTER — Emergency Department (HOSPITAL_COMMUNITY)
Admission: EM | Admit: 2019-07-04 | Discharge: 2019-07-04 | Disposition: A | Discharge status: Home / Self Care | Payer: Medicare HMO | Attending: Emergency Medicine | Admitting: Emergency Medicine

## 2019-07-04 ENCOUNTER — Encounter (HOSPITAL_COMMUNITY): Payer: Self-pay

## 2019-07-04 ENCOUNTER — Emergency Department (HOSPITAL_COMMUNITY): Payer: Medicare HMO

## 2019-07-04 ENCOUNTER — Ambulatory Visit
Admission: EM | Admit: 2019-07-04 | Discharge: 2019-07-04 | Disposition: A | Discharge status: Home / Self Care | Payer: Medicare HMO | Source: Home / Self Care | Attending: Emergency Medicine | Admitting: Emergency Medicine

## 2019-07-04 DIAGNOSIS — I4891 Unspecified atrial fibrillation: Secondary | ICD-10-CM | POA: Diagnosis not present

## 2019-07-04 DIAGNOSIS — I509 Heart failure, unspecified: Secondary | ICD-10-CM | POA: Diagnosis not present

## 2019-07-04 DIAGNOSIS — N186 End stage renal disease: Secondary | ICD-10-CM | POA: Insufficient documentation

## 2019-07-04 DIAGNOSIS — R531 Weakness: Secondary | ICD-10-CM

## 2019-07-04 DIAGNOSIS — T361X1A Poisoning by cephalosporins and other beta-lactam antibiotics, accidental (unintentional), initial encounter: Secondary | ICD-10-CM | POA: Diagnosis not present

## 2019-07-04 DIAGNOSIS — Z992 Dependence on renal dialysis: Secondary | ICD-10-CM | POA: Diagnosis not present

## 2019-07-04 DIAGNOSIS — T50901A Poisoning by unspecified drugs, medicaments and biological substances, accidental (unintentional), initial encounter: Secondary | ICD-10-CM | POA: Diagnosis not present

## 2019-07-04 DIAGNOSIS — T375X1A Poisoning by antiviral drugs, accidental (unintentional), initial encounter: Secondary | ICD-10-CM | POA: Diagnosis not present

## 2019-07-04 DIAGNOSIS — R441 Visual hallucinations: Secondary | ICD-10-CM | POA: Diagnosis not present

## 2019-07-04 DIAGNOSIS — R404 Transient alteration of awareness: Secondary | ICD-10-CM | POA: Diagnosis not present

## 2019-07-04 LAB — COMPREHENSIVE METABOLIC PANEL
ALT: 25 U/L (ref 0–44)
AST: 24 U/L (ref 15–41)
Albumin: 3.9 g/dL (ref 3.5–5.0)
Alkaline Phosphatase: 83 U/L (ref 38–126)
Anion gap: 18 — ABNORMAL HIGH (ref 5–15)
BUN: 51 mg/dL — ABNORMAL HIGH (ref 8–23)
CO2: 20 mmol/L — ABNORMAL LOW (ref 22–32)
Calcium: 10.3 mg/dL (ref 8.9–10.3)
Chloride: 99 mmol/L (ref 98–111)
Creatinine, Ser: 10.74 mg/dL — ABNORMAL HIGH (ref 0.44–1.00)
GFR calc Af Amer: 3 mL/min — ABNORMAL LOW (ref >60)
GFR calc non Af Amer: 3 mL/min — ABNORMAL LOW (ref >60)
Glucose, Bld: 188 mg/dL — ABNORMAL HIGH (ref 70–99)
Potassium: 4.8 mmol/L (ref 3.5–5.1)
Sodium: 137 mmol/L (ref 135–145)
Total Bilirubin: 0.9 mg/dL (ref 0.3–1.2)
Total Protein: 6.7 g/dL (ref 6.5–8.1)

## 2019-07-04 LAB — CBC
HCT: 36.9 % (ref 36.0–46.0)
Hemoglobin: 11.9 g/dL — ABNORMAL LOW (ref 12.0–15.0)
MCH: 36.2 pg — ABNORMAL HIGH (ref 26.0–34.0)
MCHC: 32.2 g/dL (ref 30.0–36.0)
MCV: 112.2 fL — ABNORMAL HIGH (ref 80.0–100.0)
Platelets: 153 10*3/uL (ref 150–400)
RBC: 3.29 MIL/uL — ABNORMAL LOW (ref 3.87–5.11)
RDW: 14.8 % (ref 11.5–15.5)
WBC: 6.4 10*3/uL (ref 4.0–10.5)
nRBC: 0 % (ref 0.0–0.2)

## 2019-07-04 MED ORDER — VALACYCLOVIR HCL 500 MG PO TABS: 500.0000 mg | ORAL_TABLET | Freq: Every day | ORAL | 0 refills | Status: AC

## 2019-07-04 MED ORDER — CEPHALEXIN 500 MG PO CAPS: 500.0000 mg | ORAL_CAPSULE | Freq: Every day | ORAL | 0 refills | Status: AC

## 2019-07-04 NOTE — ED Triage Notes (Signed)
Pt reports waking up feeling weak and unwell, pt's son states pts speech is more delayed and is having a more difficult time walking. Pt is concerned she is having a reaction to prednisone .

## 2019-07-04 NOTE — Discharge Instructions (Addendum)
There were no changes on your EKG today.  I feel that we need more information to make sure that you are not having a serious cause of your weakness such as elevated potassium, problem with your heart, worsening pneumonia, acute anemia.

## 2019-07-04 NOTE — ED Provider Notes (Signed)
HPI  SUBJECTIVE:  Sabrina Beck is a 78 y.o. female who presents with generalized weakness starting this morning.  States that she feels "slightly dizzy".  Son states the patient did not need a walker yesterday, but is requiring 1 today.  She started prednisone, Valtrex and Keflex 2 days ago, but has not had any other medication changes.  No nausea, vomiting, fevers, coughing, wheezing, chest pain, shortness of breath, palpitations.  No abdominal pain.  No melena, hematochezia, epistaxis, hematuria.  Son states that the patient was not talking as fast as she normally does this morning, but denies slurred speech.  States that she seems to be at baseline mental status now.  No facial droop, unilateral arm or leg weakness, headache, numbness or tingling in her arms or legs.  She has a history of end-stage renal disease on hemodialysis Monday Wednesday Friday, last dialysis was on Wednesday.  She also has a history of atrial fibrillation for which she takes warfarin.  No history of anemia, CHF.  She was discharged from the hospital for pneumonia 1 month ago.  She currently has a skin infection from an infiltrated IV.  She also has shingles currently.  No history of diabetes, hypertension, stroke, TIA.  UEK:CMKLK, Purcell Nails, MD    Past Medical History:  Diagnosis Date  . Anemia   . Atrial fibrillation ([place])   . Chest pain 05/15/2006   Stress test negative for ischemia  . Diastolic heart failure ([place])   . Diverticulosis   . ESRD on hemodialysis ([place])   . GERD (gastroesophageal reflux disease)   . Headache   . Mixed hyperlipidemia   . Peripheral neuropathy   . PONV (postoperative nausea and vomiting)   . Systemic hypertension   . Uterine cancer [facility])     Past Surgical History:  Procedure Laterality Date  . ABDOMINAL HYSTERECTOMY    . APPENDECTOMY    . AV FISTULA PLACEMENT Left 11/17/2013   Procedure: CREATION OF LEFT RADIOCEPHALIC ARTERIOVENOUS (AV) FISTULA ;  Surgeon: [name], MD;   Location: [place];  Service: Vascular;  Laterality: Left;  . BREAST SURGERY Left    tumors non canerous 2- 3 different surgeries  . CATARACT EXTRACTION W/PHACO Right 10/26/2016   Procedure: CATARACT EXTRACTION PHACO AND INTRAOCULAR LENS PLACEMENT (IOC) CDE= 11.91;  Surgeon: Tonny Branch, MD;  Location: [facility];  Service: Ophthalmology;  Laterality: Right;  right - pt knows to arrive at 6:30  . CATARACT EXTRACTION W/PHACO Left 11/30/2016   Procedure: CATARACT EXTRACTION PHACO AND INTRAOCULAR LENS PLACEMENT (IOC) CDE - 7.92 ;  Surgeon: Tonny Branch, MD;  Location: [facility];  Service: Ophthalmology;  Laterality: Left;  left  . CHOLECYSTECTOMY    . COLONOSCOPY W/ BIOPSIES AND POLYPECTOMY    . FISTULA SUPERFICIALIZATION Left 05/19/2014   Procedure: FISTULA SUPERFICIALIZATION-LEFT ARM;  Surgeon: [name], MD;  Location: [place];  Service: Vascular;  Laterality: Left;  . INSERTION OF DIALYSIS CATHETER N/A 01/24/2014   Procedure: INSERTION OF DIALYSIS CATHETER;  Surgeon: Rosetta Posner, MD;  Location: [facility] OR;  Service: Vascular;  Laterality: N/A;  . KNEE ARTHROSCOPY    . TUBAL LIGATION      Family History  Problem Relation Age of Onset  . Diabetes Mother   . Hypertension Mother   . Diabetes Father   . Heart disease Father        before age 22  . Heart attack Father   . Diabetes Sister   . Hyperlipidemia Sister   .  Hypertension Sister   . Cancer Brother   . Diabetes Brother   . Heart disease Brother   . Hypertension Brother   . Heart attack Brother   . Colon cancer Neg Hx     Social History   Tobacco Use  . Smoking status: Former Smoker    Quit date: 08/08/1999    Years since quitting: 19.9  . Smokeless tobacco: Never Used  Substance Use Topics  . Alcohol use: No    Alcohol/week: 0.0 standard drinks  . Drug use: No    No current facility-administered medications for this encounter.   Current Outpatient Medications:  .  acetaminophen (TYLENOL) 500 MG tablet, Take 1,000 mg by mouth  every 6 (six) hours as needed for moderate pain. , Disp: , Rfl:  .  albuterol (VENTOLIN HFA) 108 (90 Base) MCG/ACT inhaler, Inhale 2 puffs into the lungs every 4 (four) hours as needed for wheezing or shortness of breath (cough, shortness of breath or wheezing.)., Disp: 18 g, Rfl: 1 .  cephALEXin (KEFLEX) 500 MG capsule, Take 1 capsule (500 mg total) by mouth 4 (four) times daily for 5 days., Disp: 20 capsule, Rfl: 0 .  Cholecalciferol (VITAMIN D) 2000 UNITS CAPS, Take 2,000 Units by mouth every evening. , Disp: , Rfl:  .  fluticasone (FLOVENT HFA) 44 MCG/ACT inhaler, Inhale 2 puffs into the lungs 2 (two) times daily., Disp: 1 Inhaler, Rfl: 2 .  gabapentin (NEURONTIN) 100 MG capsule, Take 100 mg by mouth 2 (two) times daily. , Disp: , Rfl:  .  metoprolol tartrate (LOPRESSOR) 25 MG tablet, Take 1 tablet (25 mg total) by mouth 2 (two) times daily., Disp: 60 tablet, Rfl: 1 .  Multiple Vitamins-Minerals (PRESERVISION AREDS 2 PO), Take 1 tablet by mouth 2 (two) times daily., Disp: , Rfl:  .  multivitamin (RENA-VIT) TABS tablet, Take 1 tablet by mouth every evening. , Disp: , Rfl:  .  Omega-3 Fatty Acids (OMEGA-3 FISH OIL) 300 MG CAPS, Take 300 mg by mouth 2 (two) times daily., Disp: , Rfl:  .  omeprazole (PRILOSEC) 20 MG capsule, Take 20 mg by mouth every morning. , Disp: , Rfl:  .  predniSONE (STERAPRED UNI-PAK 21 TAB) 10 MG (21) TBPK tablet, Take by mouth daily. Take 6 tabs by mouth daily  for 2 days, then 5 tabs for 2 days, then 4 tabs for 2 days, then 3 tabs for 2 days, 2 tabs for 2 days, then 1 tab by mouth daily for 2 days, Disp: 42 tablet, Rfl: 0 .  Propylene Glycol (SYSTANE BALANCE) 0.6 % SOLN, Place 1 drop into both eyes 3 (three) times daily as needed (for burning or dry eyes). , Disp: , Rfl:  .  RENVELA 800 MG tablet, Take 2,400 mg by mouth 3 (three) times daily with meals. Takes 1 tablet with snacks, Disp: , Rfl:  .  SSD 1 % cream, APPLY A THICK LAYER OF CREAM TO ENTIRE WOUND AREA ONCE A DAY,  Disp: , Rfl:  .  valACYclovir (VALTREX) 1000 MG tablet, Take 1 tablet (1,000 mg total) by mouth 3 (three) times daily for 10 days., Disp: 30 tablet, Rfl: 0 .  warfarin (COUMADIN) 5 MG tablet, Take 1.5 to 2 tablets by mouth daily as directed by coumadin clinic (Patient taking differently: Take 7.5-10 mg by mouth See admin instructions. 10mg  (10mg  total) on Mondays, then take 7.5mg  (1.5 tablets) on all other days in the evening.), Disp: 150 tablet, Rfl: 1  No Known Allergies  ROS  As noted in HPI.   Physical Exam  BP 123/66   Pulse 100   Temp 97.7 F (36.5 C)   Resp 20   SpO2 94%   Constitutional: Well developed, well nourished, no acute distress Eyes: PERRL, EOMI, conjunctiva normal bilaterally HENT: Normocephalic, atraumatic,mucus membranes moist Respiratory: Clear to auscultation bilaterally, no rales, no wheezing, no rhonchi Cardiovascular: Irregularly irregular, no murmurs, no gallops, no rubs GI: Nondistended skin: No rash, skin intact Musculoskeletal: No edema, no tenderness, no deformities Neurologic: Alert & oriented x 3, CN III-XII intact, no pronator drift upper or lower extremities.  Grip strength 5/5 and equal bilaterally, speech fluent, mentation at baseline per son.  No facial droop.  No motor deficits, sensation grossly intact.  Patient able to ambulate with a walker with difficulty. Psychiatric: Speech and behavior appropriate   ED Course   Medications - No data to display  Orders Placed This Encounter  Procedures  . ED EKG    Standing Status:   Standing    Number of Occurrences:   1    Order Specific Question:   Reason for Exam    Answer:   Weakness   No results found for this or any previous visit (from the past 24 hour(s)). No results found.  ED Clinical Impression  1. Weakness      ED Assessment/Plan  Differential of weakness is vast, especially with multiple medical comorbidities.  Feel that we need more information beyond what I can obtain  here at the urgent care to rule out serious causes.  Does not appear to be a stroke as this is generalized weakness.  Could be worsening A. fib, pneumonia, anemia, electrolyte imbalance.  Will get EKG.  Plan to transfer to the [facility] emergency department.    EKG: Atrial fibrillation, left axis deviation.  Age indeterminant septal infarct.  No ST elevation.  No changes compared to previous EKG from 06/05/2019  Feel that she is stable to go by private vehicle.  Discussed  MDM, rationale for transfer to the emergency department with patient and son.  They agree to go.  No orders of the defined types were placed in this encounter.   *This clinic note was created using Dragon dictation software. Therefore, there may be occasional mistakes despite careful proofreading.  ?    Melynda Ripple, MD 07/04/19 1231

## 2019-07-04 NOTE — ED Triage Notes (Addendum)
Pt reports she felt good yesterday, but woke up during the night and felt different. Pt reports that TV kept running down the road and then could only see half of couch and TV Pt says she took all meds last night. Pt went Wednesday to urgent care  and was dx with shigles and started prednisone, cipro and anti viral

## 2019-07-04 NOTE — Discharge Instructions (Addendum)
I suspect your symptoms are from inadvertent medication overdose.  You were written for 1,000 mg of valacyclovir 3x/day. Because of your renal failure you should be getting 500 mg once daily.  You are also taking too much cephalexin but this probably isn't contributing significantly to your symptoms. Prednisone can potentially cause symptoms of weakness, fatigue, confusion, etc. It's not necessary to change the dose with dialysis though. I suspect that main problem is the valacyclovir.  -Keep taking prednisone as prescribed. -Do not take any additional cephalexin of valacylovir until you receive dialysis tomorrow. -After dialysis tomorrow, take valacyclovir 500 mg once daily and cephalexin 500 mg once daily. Cephalexin and valacyclovir should be taken after dialysis on dialysis days.   If you do not notice a significant improvement in your symptoms by tomorrow afternoon then I want you to be re-evaluated.

## 2019-07-04 NOTE — ED Provider Notes (Signed)
[facility] EMERGENCY DEPARTMENT Provider Note   CSN: [id number] Arrival date & time: 07/04/19  1203     History   Chief Complaint Chief Complaint  Patient presents with  . Altered Mental Status  . Hallucinations    HPI Sabrina Beck is a 78 y.o. female.     HPI   78 year old female brought in by her family for evaluation of change in mental status and some hallucinations.  Onset yesterday.  She felt relatively fine in the morning yesterday.  Sometime in the afternoon to late evening she began feeling "different."  She has a hard time describing symptoms further.  She has experienced some visual changes or visual hallucinations.  At one point she felt like the TV was moving and going down the street.  At times she feels like objects have seem distorted to her.  Denies any acute pain aside from her left back where she was recently diagnosed with shingles.  She was started on prednisone, valacyclovir.  She was also started on Keflex for a wound/cellulitis to her right forearm.  No fevers or chills.  Past Medical History:  Diagnosis Date  . Anemia   . Atrial fibrillation ([place])   . Chest pain 05/15/2006   Stress test negative for ischemia  . Diastolic heart failure ([place])   . Diverticulosis   . ESRD on hemodialysis ([place])   . GERD (gastroesophageal reflux disease)   . Headache   . Mixed hyperlipidemia   . Peripheral neuropathy   . PONV (postoperative nausea and vomiting)   . Systemic hypertension   . Uterine cancer [facility])     Patient Active Problem List   Diagnosis Date Noted  . Sepsis due to undetermined organism ([place]) 04/21/2019  . Coagulopathy ([place]) 04/21/2019  . Ischemic colitis ([place])   . CAP (community acquired pneumonia) 04/08/2019  . Permanent atrial fibrillation ([place]) 08/22/2018  . Acute systolic CHF (congestive heart failure) ([place]) 08/22/2018  . Community acquired pneumonia   . HCAP (healthcare-associated pneumonia) 08/18/2018  . Acute respiratory failure with  hypoxia ([place]) 08/18/2018  . Pulmonary edema 08/18/2018  . Lobar pneumonia ([place]) 08/18/2018  . Dependence on renal dialysis ([place]) 10/19/2015  . Pure hypercholesterolemia 10/19/2015  . ESRD (end stage renal disease) ([place]) 05/19/2014  . 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 Abscess re-check 01/19/2014  . Breast abscess of female 01/19/2014  . Chronic diastolic CHF (congestive heart failure) ([place]) 12/25/2013  . Dyspnea 12/21/2013  . CHF (congestive heart failure) ([place]) 12/21/2013  . End stage renal disease ([place]) 11/06/2013  . CKD (chronic kidney disease) stage 5, GFR less than 15 ml/min (HCC) 10/21/2013  . HTN (hypertension) 10/21/2013  . Mixed hyperlipidemia 10/21/2013  . Encounter for therapeutic drug monitoring 09/03/2013  . Atrial fibrillation ([place]) 10/11/2012  . Long term current use of anticoagulant therapy 10/11/2012  . Anemia 03/27/2011  . Gastro-esophageal reflux disease without esophagitis 05/27/2010  . Malignant neoplasm of uterus ([place]) 05/27/2010    Past Surgical History:  Procedure Laterality Date  . ABDOMINAL HYSTERECTOMY    . APPENDECTOMY    . AV FISTULA PLACEMENT Left 11/17/2013   Procedure: CREATION OF LEFT RADIOCEPHALIC ARTERIOVENOUS (AV) FISTULA ;  Surgeon: [name], MD;  Location: [place];  Service: Vascular;  Laterality: Left;  . BREAST SURGERY Left    tumors non canerous 2- 3 different surgeries  . CATARACT EXTRACTION W/PHACO Right 10/26/2016   Procedure: CATARACT EXTRACTION PHACO AND INTRAOCULAR LENS PLACEMENT (IOC) CDE=  11.91;  Surgeon: Tonny Branch, MD;  Location: [facility];  Service: Ophthalmology;  Laterality: Right;  right - pt knows to arrive at 6:30  . CATARACT EXTRACTION W/PHACO Left 11/30/2016   Procedure: CATARACT EXTRACTION PHACO AND INTRAOCULAR LENS PLACEMENT (IOC) CDE - 7.92 ;  Surgeon: Tonny Branch, MD;  Location: [facility];  Service: Ophthalmology;  Laterality: Left;  left  . CHOLECYSTECTOMY    . COLONOSCOPY W/  BIOPSIES AND POLYPECTOMY    . FISTULA SUPERFICIALIZATION Left 05/19/2014   Procedure: FISTULA SUPERFICIALIZATION-LEFT ARM;  Surgeon: [name], MD;  Location: [place];  Service: Vascular;  Laterality: Left;  . INSERTION OF DIALYSIS CATHETER N/A 01/24/2014   Procedure: INSERTION OF DIALYSIS CATHETER;  Surgeon: Rosetta Posner, MD;  Location: [facility] OR;  Service: Vascular;  Laterality: N/A;  . KNEE ARTHROSCOPY    . TUBAL LIGATION       OB History    Gravida  3   Para  2   Term  2   Preterm      AB  1   Living        SAB  1   TAB      Ectopic      Multiple      Live Births               Home Medications    Prior to Admission medications   Medication Sig Start Date End Date Taking? Authorizing Provider  acetaminophen (TYLENOL) 500 MG tablet Take 1,000 mg by mouth every 6 (six) hours as needed for moderate pain.     [provider]  albuterol (VENTOLIN HFA) 108 (90 Base) MCG/ACT inhaler Inhale 2 puffs into the lungs every 4 (four) hours as needed for wheezing or shortness of breath (cough, shortness of breath or wheezing.). 04/25/19   Wynetta Emery, Clanford L, MD  cephALEXin (KEFLEX) 500 MG capsule Take 1 capsule (500 mg total) by mouth 4 (four) times daily for 5 days. 07/02/19 07/07/19  Wurst, Marye Round, PA-C  Cholecalciferol (VITAMIN D) 2000 UNITS CAPS Take 2,000 Units by mouth every evening.     [provider]  fluticasone (FLOVENT HFA) 44 MCG/ACT inhaler Inhale 2 puffs into the lungs 2 (two) times daily. 04/25/19 04/24/20  Johnson, Clanford L, MD  gabapentin (NEURONTIN) 100 MG capsule Take 100 mg by mouth 2 (two) times daily.  09/13/18   [provider]  metoprolol tartrate (LOPRESSOR) 25 MG tablet Take 1 tablet (25 mg total) by mouth 2 (two) times daily. 04/25/19   Johnson, Clanford L, MD  Multiple Vitamins-Minerals (PRESERVISION AREDS 2 PO) Take 1 tablet by mouth 2 (two) times daily.    [provider]  multivitamin (RENA-VIT) TABS tablet Take  1 tablet by mouth every evening.  07/06/18   [provider]  Omega-3 Fatty Acids (OMEGA-3 FISH OIL) 300 MG CAPS Take 300 mg by mouth 2 (two) times daily.    [provider]  omeprazole (PRILOSEC) 20 MG capsule Take 20 mg by mouth every morning.     [provider]  predniSONE (STERAPRED UNI-PAK 21 TAB) 10 MG (21) TBPK tablet Take by mouth daily. Take 6 tabs by mouth daily  for 2 days, then 5 tabs for 2 days, then 4 tabs for 2 days, then 3 tabs for 2 days, 2 tabs for 2 days, then 1 tab by mouth daily for 2 days 07/02/19   Wurst, [place], PA-C  Propylene Glycol (SYSTANE BALANCE) 0.6 % SOLN Place 1 drop  into both eyes 3 (three) times daily as needed (for burning or dry eyes).     [provider]  RENVELA 800 MG tablet Take 2,400 mg by mouth 3 (three) times daily with meals. Takes 1 tablet with snacks 04/17/15   [provider]  SSD 1 % cream APPLY A THICK LAYER OF CREAM TO ENTIRE WOUND AREA ONCE A DAY 05/29/19   [provider]  valACYclovir (VALTREX) 1000 MG tablet Take 1 tablet (1,000 mg total) by mouth 3 (three) times daily for 10 days. 07/02/19 07/12/19  Lestine Box, PA-C  warfarin (COUMADIN) 5 MG tablet Take 1.5 to 2 tablets by mouth daily as directed by coumadin clinic Patient taking differently: Take 7.5-10 mg by mouth See admin instructions. 10mg  (10mg  total) on Mondays, then take 7.5mg  (1.5 tablets) on all other days in the evening. 10/15/18   Lendon Colonel, NP    Family History Family History  Problem Relation Age of Onset  . Diabetes Mother   . Hypertension Mother   . Diabetes Father   . Heart disease Father        before age 67  . Heart attack Father   . Diabetes Sister   . Hyperlipidemia Sister   . Hypertension Sister   . Cancer Brother   . Diabetes Brother   . Heart disease Brother   . Hypertension Brother   . Heart attack Brother   . Colon cancer Neg Hx     Social History Social History   Tobacco Use  .  Smoking status: Former Smoker    Quit date: 08/08/1999    Years since quitting: 19.9  . Smokeless tobacco: Never Used  Substance Use Topics  . Alcohol use: No    Alcohol/week: 0.0 standard drinks  . Drug use: No     Allergies   Patient has no known allergies.   Review of Systems Review of Systems  All systems reviewed and negative, other than as noted in HPI.  Physical Exam Updated Vital Signs BP 130/81 (BP Location: Right Arm)   Pulse 91   Temp 98.9 F (37.2 C) (Oral)   Resp 18   SpO2 100%   Physical Exam Vitals signs and nursing note reviewed.  Constitutional:      General: She is not in acute distress.    Appearance: She is well-developed.  HENT:     Head: Normocephalic and atraumatic.  Eyes:     General:        Right eye: No discharge.        Left eye: No discharge.     Conjunctiva/sclera: Conjunctivae normal.  Neck:     Musculoskeletal: Neck supple.  Cardiovascular:     Rate and Rhythm: Normal rate and regular rhythm.     Heart sounds: Normal heart sounds. No murmur. No friction rub. No gallop.   Pulmonary:     Effort: Pulmonary effort is normal. No respiratory distress.     Breath sounds: Normal breath sounds.  Abdominal:     General: There is no distension.     Palpations: Abdomen is soft.     Tenderness: There is no abdominal tenderness.  Musculoskeletal:     Comments: Wound with eschar RUE as pictured in note from 07/02/19. Decreased erythema from that time.   Skin:    General: Skin is warm and dry.  Neurological:     Mental Status: She is alert and oriented to person, place, and time.     Comments: Speech  clear. Answering questions appropriately but looses train of thought at times. CN 2-12 intact. Strength normal b/l u/l ext.   Psychiatric:        Behavior: Behavior normal.        Thought Content: Thought content normal.      ED Treatments / Results  Labs (all labs ordered are listed, but only abnormal results are displayed) Labs Reviewed   COMPREHENSIVE METABOLIC PANEL - Abnormal; Notable for the following components:      Result Value   CO2 20 (*)    Glucose, Bld 188 (*)    BUN 51 (*)    Creatinine, Ser 10.74 (*)    GFR calc non Af Amer 3 (*)    GFR calc Af Amer 3 (*)    Anion gap 18 (*)    All other components within normal limits  CBC - Abnormal; Notable for the following components:   RBC 3.29 (*)    Hemoglobin 11.9 (*)    MCV 112.2 (*)    MCH 36.2 (*)    All other components within normal limits   EKG None  Radiology No results found.  Procedures Procedures (including critical care time)  Medications Ordered in ED Medications - No data to display   Initial Impression / Assessment and Plan / ED Course  I have reviewed the triage vital signs and the nursing notes.  Pertinent labs & imaging results that were available during my care of the patient were reviewed by me and considered in my medical decision making (see chart for details).  I suspect symptoms are from inadvertent medication overdose, particularly valacyclovir.    "Symptoms and Signs  Acute renal failure and neurological symptoms, including confusion, hallucinations, agitation, decreased consciousness and coma, have been reported in patients receiving overdoses of valaciclovir. Nausea and vomiting may also occur. Caution is required to prevent inadvertent overdosing. Many of the reported cases involved renally impaired and elderly patients receiving repeated overdoses, due to lack of appropriate dosage reduction.  Treatment  Patients should be observed closely for signs of toxicity. Haemodialysis significantly enhances the removal of aciclovir from the blood and may, therefore, be considered a management option in the event of symptomatic overdose."  She was written for 1,000 mg of valacyclovir 3x/day. With ESRD she should be getting 500mg  once daily.  She is also taking too much cephalexin but this probably isn't contributing significantly  to presenting symptoms. Prednisone can potentially cause symptoms of weakness, fatigue, confusion, hallucinations, etc but will have her continue for now.   She was advised to (verbally with family present and written instructions):  -Keep taking prednisone as prescribed. -Do not take any additional cephalexin of valacylovir until after dialysis tomorrow. -After dialysis tomorrow, take valacyclovir 500 mg once daily and cephalexin 500 mg once daily. Cephalexin and valacyclovir should be taken after dialysis on dialysis days.   She was provided with new prescriptions to facilitate this and hopefully reduce potential confusion about changed dosing. I advised that I expect symptoms to continue until she has HD. She says she goes at [facility]. She is symptomatic but not to the degree that I think admission for emergent HD is needed. I do not expect getting HD ~12 hours later than I could emergently will place her at significantly higher risk for a bad clinical outcome. Pt/family comfortable with going home at this time. She and family understand that if her symptoms do not show significant improvement after HD then I want her re-evaluated.  Final Clinical Impressions(s) / ED Diagnoses   Final diagnoses:  Accidental drug overdose, initial encounter  Generalized weakness    ED Discharge Orders    None       Virgel Manifold, MD 07/06/19 [phone]

## 2019-07-05 DIAGNOSIS — N186 End stage renal disease: Secondary | ICD-10-CM | POA: Diagnosis not present

## 2019-07-05 DIAGNOSIS — Z992 Dependence on renal dialysis: Secondary | ICD-10-CM | POA: Diagnosis not present

## 2019-07-07 DIAGNOSIS — N186 End stage renal disease: Secondary | ICD-10-CM | POA: Diagnosis not present

## 2019-07-07 DIAGNOSIS — Z992 Dependence on renal dialysis: Secondary | ICD-10-CM | POA: Diagnosis not present

## 2019-07-08 DIAGNOSIS — N186 End stage renal disease: Secondary | ICD-10-CM | POA: Diagnosis not present

## 2019-07-08 DIAGNOSIS — T80818D Extravasation of other vesicant agent, subsequent encounter: Secondary | ICD-10-CM | POA: Diagnosis not present

## 2019-07-08 DIAGNOSIS — D631 Anemia in chronic kidney disease: Secondary | ICD-10-CM | POA: Diagnosis not present

## 2019-07-08 DIAGNOSIS — K219 Gastro-esophageal reflux disease without esophagitis: Secondary | ICD-10-CM | POA: Diagnosis not present

## 2019-07-08 DIAGNOSIS — I132 Hypertensive heart and chronic kidney disease with heart failure and with stage 5 chronic kidney disease, or end stage renal disease: Secondary | ICD-10-CM | POA: Diagnosis not present

## 2019-07-08 DIAGNOSIS — K559 Vascular disorder of intestine, unspecified: Secondary | ICD-10-CM | POA: Diagnosis not present

## 2019-07-08 DIAGNOSIS — K5790 Diverticulosis of intestine, part unspecified, without perforation or abscess without bleeding: Secondary | ICD-10-CM | POA: Diagnosis not present

## 2019-07-08 DIAGNOSIS — I5042 Chronic combined systolic (congestive) and diastolic (congestive) heart failure: Secondary | ICD-10-CM | POA: Diagnosis not present

## 2019-07-08 DIAGNOSIS — I4821 Permanent atrial fibrillation: Secondary | ICD-10-CM | POA: Diagnosis not present

## 2019-07-08 DIAGNOSIS — E782 Mixed hyperlipidemia: Secondary | ICD-10-CM | POA: Diagnosis not present

## 2019-07-09 DIAGNOSIS — N186 End stage renal disease: Secondary | ICD-10-CM | POA: Diagnosis not present

## 2019-07-09 DIAGNOSIS — Z992 Dependence on renal dialysis: Secondary | ICD-10-CM | POA: Diagnosis not present

## 2019-07-09 DIAGNOSIS — L98499 Non-pressure chronic ulcer of skin of other sites with unspecified severity: Secondary | ICD-10-CM | POA: Diagnosis not present

## 2019-07-11 DIAGNOSIS — Z992 Dependence on renal dialysis: Secondary | ICD-10-CM | POA: Diagnosis not present

## 2019-07-11 DIAGNOSIS — L98499 Non-pressure chronic ulcer of skin of other sites with unspecified severity: Secondary | ICD-10-CM | POA: Diagnosis not present

## 2019-07-11 DIAGNOSIS — N186 End stage renal disease: Secondary | ICD-10-CM | POA: Diagnosis not present

## 2019-07-14 DIAGNOSIS — N2581 Secondary hyperparathyroidism of renal origin: Secondary | ICD-10-CM | POA: Diagnosis not present

## 2019-07-14 DIAGNOSIS — L98499 Non-pressure chronic ulcer of skin of other sites with unspecified severity: Secondary | ICD-10-CM | POA: Diagnosis not present

## 2019-07-14 DIAGNOSIS — N186 End stage renal disease: Secondary | ICD-10-CM | POA: Diagnosis not present

## 2019-07-14 DIAGNOSIS — Z992 Dependence on renal dialysis: Secondary | ICD-10-CM | POA: Diagnosis not present

## 2019-07-15 ENCOUNTER — Ambulatory Visit (INDEPENDENT_AMBULATORY_CARE_PROVIDER_SITE_OTHER): Payer: Medicare HMO | Admitting: *Deleted

## 2019-07-15 DIAGNOSIS — I4821 Permanent atrial fibrillation: Secondary | ICD-10-CM | POA: Diagnosis not present

## 2019-07-15 DIAGNOSIS — Z5181 Encounter for therapeutic drug level monitoring: Secondary | ICD-10-CM

## 2019-07-15 DIAGNOSIS — K219 Gastro-esophageal reflux disease without esophagitis: Secondary | ICD-10-CM | POA: Diagnosis not present

## 2019-07-15 DIAGNOSIS — K5790 Diverticulosis of intestine, part unspecified, without perforation or abscess without bleeding: Secondary | ICD-10-CM | POA: Diagnosis not present

## 2019-07-15 DIAGNOSIS — I132 Hypertensive heart and chronic kidney disease with heart failure and with stage 5 chronic kidney disease, or end stage renal disease: Secondary | ICD-10-CM | POA: Diagnosis not present

## 2019-07-15 DIAGNOSIS — I5042 Chronic combined systolic (congestive) and diastolic (congestive) heart failure: Secondary | ICD-10-CM | POA: Diagnosis not present

## 2019-07-15 DIAGNOSIS — E782 Mixed hyperlipidemia: Secondary | ICD-10-CM | POA: Diagnosis not present

## 2019-07-15 DIAGNOSIS — N186 End stage renal disease: Secondary | ICD-10-CM | POA: Diagnosis not present

## 2019-07-15 DIAGNOSIS — T80818D Extravasation of other vesicant agent, subsequent encounter: Secondary | ICD-10-CM | POA: Diagnosis not present

## 2019-07-15 DIAGNOSIS — D631 Anemia in chronic kidney disease: Secondary | ICD-10-CM | POA: Diagnosis not present

## 2019-07-15 DIAGNOSIS — K559 Vascular disorder of intestine, unspecified: Secondary | ICD-10-CM | POA: Diagnosis not present

## 2019-07-15 LAB — POCT INR: INR: 3.1 — AB (ref 2.0–3.0)

## 2019-07-15 NOTE — Patient Instructions (Signed)
Spoke with Verdis Frederickson from [facility]. Take warfarin 1/2 tablet tonight then resume 1 1/2 tablets daily except 2 tablets on Mondays and Thursdays Recheck in 1 week by [facility]. Dialysis on M,W,F

## 2019-07-16 DIAGNOSIS — Z992 Dependence on renal dialysis: Secondary | ICD-10-CM | POA: Diagnosis not present

## 2019-07-16 DIAGNOSIS — N186 End stage renal disease: Secondary | ICD-10-CM | POA: Diagnosis not present

## 2019-07-16 DIAGNOSIS — L98499 Non-pressure chronic ulcer of skin of other sites with unspecified severity: Secondary | ICD-10-CM | POA: Diagnosis not present

## 2019-07-18 DIAGNOSIS — L98499 Non-pressure chronic ulcer of skin of other sites with unspecified severity: Secondary | ICD-10-CM | POA: Diagnosis not present

## 2019-07-18 DIAGNOSIS — N186 End stage renal disease: Secondary | ICD-10-CM | POA: Diagnosis not present

## 2019-07-18 DIAGNOSIS — Z992 Dependence on renal dialysis: Secondary | ICD-10-CM | POA: Diagnosis not present

## 2019-07-21 DIAGNOSIS — N186 End stage renal disease: Secondary | ICD-10-CM | POA: Diagnosis not present

## 2019-07-21 DIAGNOSIS — Z992 Dependence on renal dialysis: Secondary | ICD-10-CM | POA: Diagnosis not present

## 2019-07-21 DIAGNOSIS — N2581 Secondary hyperparathyroidism of renal origin: Secondary | ICD-10-CM | POA: Diagnosis not present

## 2019-07-21 DIAGNOSIS — L98499 Non-pressure chronic ulcer of skin of other sites with unspecified severity: Secondary | ICD-10-CM | POA: Diagnosis not present

## 2019-07-23 ENCOUNTER — Ambulatory Visit (INDEPENDENT_AMBULATORY_CARE_PROVIDER_SITE_OTHER): Payer: Medicare HMO | Admitting: *Deleted

## 2019-07-23 DIAGNOSIS — N186 End stage renal disease: Secondary | ICD-10-CM | POA: Diagnosis not present

## 2019-07-23 DIAGNOSIS — K559 Vascular disorder of intestine, unspecified: Secondary | ICD-10-CM | POA: Diagnosis not present

## 2019-07-23 DIAGNOSIS — I4821 Permanent atrial fibrillation: Secondary | ICD-10-CM | POA: Diagnosis not present

## 2019-07-23 DIAGNOSIS — I4891 Unspecified atrial fibrillation: Secondary | ICD-10-CM | POA: Diagnosis not present

## 2019-07-23 DIAGNOSIS — Z992 Dependence on renal dialysis: Secondary | ICD-10-CM | POA: Diagnosis not present

## 2019-07-23 DIAGNOSIS — I5042 Chronic combined systolic (congestive) and diastolic (congestive) heart failure: Secondary | ICD-10-CM | POA: Diagnosis not present

## 2019-07-23 DIAGNOSIS — T80818D Extravasation of other vesicant agent, subsequent encounter: Secondary | ICD-10-CM | POA: Diagnosis not present

## 2019-07-23 DIAGNOSIS — D631 Anemia in chronic kidney disease: Secondary | ICD-10-CM | POA: Diagnosis not present

## 2019-07-23 DIAGNOSIS — Z5181 Encounter for therapeutic drug level monitoring: Secondary | ICD-10-CM

## 2019-07-23 DIAGNOSIS — K5790 Diverticulosis of intestine, part unspecified, without perforation or abscess without bleeding: Secondary | ICD-10-CM | POA: Diagnosis not present

## 2019-07-23 DIAGNOSIS — K219 Gastro-esophageal reflux disease without esophagitis: Secondary | ICD-10-CM | POA: Diagnosis not present

## 2019-07-23 DIAGNOSIS — L98499 Non-pressure chronic ulcer of skin of other sites with unspecified severity: Secondary | ICD-10-CM | POA: Diagnosis not present

## 2019-07-23 DIAGNOSIS — I132 Hypertensive heart and chronic kidney disease with heart failure and with stage 5 chronic kidney disease, or end stage renal disease: Secondary | ICD-10-CM | POA: Diagnosis not present

## 2019-07-23 DIAGNOSIS — E782 Mixed hyperlipidemia: Secondary | ICD-10-CM | POA: Diagnosis not present

## 2019-07-23 LAB — POCT INR: INR: 2.5 (ref 2.0–3.0)

## 2019-07-23 NOTE — Patient Instructions (Signed)
Spoke with Judson Roch from [facility].  Pt to continue warfarin 1 1/2 tablets daily except 2 tablets on Mondays and Thursdays Recheck in 1 week by [facility]. Dialysis on M,W,F

## 2019-07-24 DIAGNOSIS — I132 Hypertensive heart and chronic kidney disease with heart failure and with stage 5 chronic kidney disease, or end stage renal disease: Secondary | ICD-10-CM | POA: Diagnosis not present

## 2019-07-24 DIAGNOSIS — I5042 Chronic combined systolic (congestive) and diastolic (congestive) heart failure: Secondary | ICD-10-CM | POA: Diagnosis not present

## 2019-07-24 DIAGNOSIS — N186 End stage renal disease: Secondary | ICD-10-CM | POA: Diagnosis not present

## 2019-07-24 DIAGNOSIS — E782 Mixed hyperlipidemia: Secondary | ICD-10-CM | POA: Diagnosis not present

## 2019-07-25 DIAGNOSIS — N186 End stage renal disease: Secondary | ICD-10-CM | POA: Diagnosis not present

## 2019-07-25 DIAGNOSIS — Z992 Dependence on renal dialysis: Secondary | ICD-10-CM | POA: Diagnosis not present

## 2019-07-25 DIAGNOSIS — L98499 Non-pressure chronic ulcer of skin of other sites with unspecified severity: Secondary | ICD-10-CM | POA: Diagnosis not present

## 2019-07-28 DIAGNOSIS — N186 End stage renal disease: Secondary | ICD-10-CM | POA: Diagnosis not present

## 2019-07-28 DIAGNOSIS — Z992 Dependence on renal dialysis: Secondary | ICD-10-CM | POA: Diagnosis not present

## 2019-07-28 DIAGNOSIS — L98499 Non-pressure chronic ulcer of skin of other sites with unspecified severity: Secondary | ICD-10-CM | POA: Diagnosis not present

## 2019-07-30 ENCOUNTER — Ambulatory Visit (INDEPENDENT_AMBULATORY_CARE_PROVIDER_SITE_OTHER): Payer: Medicare HMO | Admitting: *Deleted

## 2019-07-30 DIAGNOSIS — T80818D Extravasation of other vesicant agent, subsequent encounter: Secondary | ICD-10-CM | POA: Diagnosis not present

## 2019-07-30 DIAGNOSIS — I132 Hypertensive heart and chronic kidney disease with heart failure and with stage 5 chronic kidney disease, or end stage renal disease: Secondary | ICD-10-CM | POA: Diagnosis not present

## 2019-07-30 DIAGNOSIS — Z5181 Encounter for therapeutic drug level monitoring: Secondary | ICD-10-CM

## 2019-07-30 DIAGNOSIS — I4821 Permanent atrial fibrillation: Secondary | ICD-10-CM

## 2019-07-30 DIAGNOSIS — D631 Anemia in chronic kidney disease: Secondary | ICD-10-CM | POA: Diagnosis not present

## 2019-07-30 DIAGNOSIS — I5042 Chronic combined systolic (congestive) and diastolic (congestive) heart failure: Secondary | ICD-10-CM | POA: Diagnosis not present

## 2019-07-30 DIAGNOSIS — E782 Mixed hyperlipidemia: Secondary | ICD-10-CM | POA: Diagnosis not present

## 2019-07-30 DIAGNOSIS — K219 Gastro-esophageal reflux disease without esophagitis: Secondary | ICD-10-CM | POA: Diagnosis not present

## 2019-07-30 DIAGNOSIS — N186 End stage renal disease: Secondary | ICD-10-CM | POA: Diagnosis not present

## 2019-07-30 DIAGNOSIS — Z992 Dependence on renal dialysis: Secondary | ICD-10-CM | POA: Diagnosis not present

## 2019-07-30 DIAGNOSIS — K5790 Diverticulosis of intestine, part unspecified, without perforation or abscess without bleeding: Secondary | ICD-10-CM | POA: Diagnosis not present

## 2019-07-30 DIAGNOSIS — L98499 Non-pressure chronic ulcer of skin of other sites with unspecified severity: Secondary | ICD-10-CM | POA: Diagnosis not present

## 2019-07-30 DIAGNOSIS — K559 Vascular disorder of intestine, unspecified: Secondary | ICD-10-CM | POA: Diagnosis not present

## 2019-07-30 LAB — POCT INR: INR: 3.5 — AB (ref 2.0–3.0)

## 2019-07-30 NOTE — Patient Instructions (Signed)
Spoke with Sabrina Beck from [facility].  Hold coumadin tonight the resume 1 1/2 tablets daily except 2 tablets on Mondays and Thursdays Recheck in 1 week by [facility]. Dialysis on M,W,F

## 2019-08-02 DIAGNOSIS — L98499 Non-pressure chronic ulcer of skin of other sites with unspecified severity: Secondary | ICD-10-CM | POA: Diagnosis not present

## 2019-08-02 DIAGNOSIS — Z992 Dependence on renal dialysis: Secondary | ICD-10-CM | POA: Diagnosis not present

## 2019-08-02 DIAGNOSIS — N186 End stage renal disease: Secondary | ICD-10-CM | POA: Diagnosis not present

## 2019-08-04 DIAGNOSIS — L98499 Non-pressure chronic ulcer of skin of other sites with unspecified severity: Secondary | ICD-10-CM | POA: Diagnosis not present

## 2019-08-04 DIAGNOSIS — Z992 Dependence on renal dialysis: Secondary | ICD-10-CM | POA: Diagnosis not present

## 2019-08-04 DIAGNOSIS — N186 End stage renal disease: Secondary | ICD-10-CM | POA: Diagnosis not present

## 2019-08-05 ENCOUNTER — Other Ambulatory Visit: Payer: Self-pay | Admitting: Cardiovascular Disease

## 2019-08-06 ENCOUNTER — Ambulatory Visit (INDEPENDENT_AMBULATORY_CARE_PROVIDER_SITE_OTHER): Payer: Medicare HMO | Admitting: *Deleted

## 2019-08-06 DIAGNOSIS — D631 Anemia in chronic kidney disease: Secondary | ICD-10-CM | POA: Diagnosis not present

## 2019-08-06 DIAGNOSIS — I4821 Permanent atrial fibrillation: Secondary | ICD-10-CM

## 2019-08-06 DIAGNOSIS — I5042 Chronic combined systolic (congestive) and diastolic (congestive) heart failure: Secondary | ICD-10-CM | POA: Diagnosis not present

## 2019-08-06 DIAGNOSIS — K5790 Diverticulosis of intestine, part unspecified, without perforation or abscess without bleeding: Secondary | ICD-10-CM | POA: Diagnosis not present

## 2019-08-06 DIAGNOSIS — L98499 Non-pressure chronic ulcer of skin of other sites with unspecified severity: Secondary | ICD-10-CM | POA: Diagnosis not present

## 2019-08-06 DIAGNOSIS — Z5181 Encounter for therapeutic drug level monitoring: Secondary | ICD-10-CM | POA: Diagnosis not present

## 2019-08-06 DIAGNOSIS — K219 Gastro-esophageal reflux disease without esophagitis: Secondary | ICD-10-CM | POA: Diagnosis not present

## 2019-08-06 DIAGNOSIS — N186 End stage renal disease: Secondary | ICD-10-CM | POA: Diagnosis not present

## 2019-08-06 DIAGNOSIS — Z992 Dependence on renal dialysis: Secondary | ICD-10-CM | POA: Diagnosis not present

## 2019-08-06 DIAGNOSIS — E782 Mixed hyperlipidemia: Secondary | ICD-10-CM | POA: Diagnosis not present

## 2019-08-06 DIAGNOSIS — K559 Vascular disorder of intestine, unspecified: Secondary | ICD-10-CM | POA: Diagnosis not present

## 2019-08-06 DIAGNOSIS — I132 Hypertensive heart and chronic kidney disease with heart failure and with stage 5 chronic kidney disease, or end stage renal disease: Secondary | ICD-10-CM | POA: Diagnosis not present

## 2019-08-06 DIAGNOSIS — T80818D Extravasation of other vesicant agent, subsequent encounter: Secondary | ICD-10-CM | POA: Diagnosis not present

## 2019-08-06 LAB — POCT INR: INR: 2.9 (ref 2.0–3.0)

## 2019-08-06 NOTE — Patient Instructions (Signed)
Spoke with Lucina Mellow from [facility].  Continue warfarin 1 1/2 tablets daily except 2 tablets on Mondays and Thursdays Recheck in 1 week by [facility]. Dialysis on M,W,F

## 2019-08-07 DIAGNOSIS — N186 End stage renal disease: Secondary | ICD-10-CM | POA: Diagnosis not present

## 2019-08-07 DIAGNOSIS — Z992 Dependence on renal dialysis: Secondary | ICD-10-CM | POA: Diagnosis not present

## 2019-08-08 DIAGNOSIS — Z992 Dependence on renal dialysis: Secondary | ICD-10-CM | POA: Diagnosis not present

## 2019-08-08 DIAGNOSIS — N186 End stage renal disease: Secondary | ICD-10-CM | POA: Diagnosis not present

## 2019-08-11 DIAGNOSIS — N186 End stage renal disease: Secondary | ICD-10-CM | POA: Diagnosis not present

## 2019-08-11 DIAGNOSIS — Z992 Dependence on renal dialysis: Secondary | ICD-10-CM | POA: Diagnosis not present

## 2019-08-13 ENCOUNTER — Telehealth: Payer: Self-pay | Admitting: *Deleted

## 2019-08-13 ENCOUNTER — Ambulatory Visit (INDEPENDENT_AMBULATORY_CARE_PROVIDER_SITE_OTHER): Payer: Medicare HMO | Admitting: *Deleted

## 2019-08-13 DIAGNOSIS — E782 Mixed hyperlipidemia: Secondary | ICD-10-CM | POA: Diagnosis not present

## 2019-08-13 DIAGNOSIS — K219 Gastro-esophageal reflux disease without esophagitis: Secondary | ICD-10-CM | POA: Diagnosis not present

## 2019-08-13 DIAGNOSIS — K559 Vascular disorder of intestine, unspecified: Secondary | ICD-10-CM | POA: Diagnosis not present

## 2019-08-13 DIAGNOSIS — I132 Hypertensive heart and chronic kidney disease with heart failure and with stage 5 chronic kidney disease, or end stage renal disease: Secondary | ICD-10-CM | POA: Diagnosis not present

## 2019-08-13 DIAGNOSIS — K5790 Diverticulosis of intestine, part unspecified, without perforation or abscess without bleeding: Secondary | ICD-10-CM | POA: Diagnosis not present

## 2019-08-13 DIAGNOSIS — I4821 Permanent atrial fibrillation: Secondary | ICD-10-CM | POA: Diagnosis not present

## 2019-08-13 DIAGNOSIS — Z5181 Encounter for therapeutic drug level monitoring: Secondary | ICD-10-CM

## 2019-08-13 DIAGNOSIS — Z992 Dependence on renal dialysis: Secondary | ICD-10-CM | POA: Diagnosis not present

## 2019-08-13 DIAGNOSIS — T80818D Extravasation of other vesicant agent, subsequent encounter: Secondary | ICD-10-CM | POA: Diagnosis not present

## 2019-08-13 DIAGNOSIS — D631 Anemia in chronic kidney disease: Secondary | ICD-10-CM | POA: Diagnosis not present

## 2019-08-13 DIAGNOSIS — N186 End stage renal disease: Secondary | ICD-10-CM | POA: Diagnosis not present

## 2019-08-13 DIAGNOSIS — I5042 Chronic combined systolic (congestive) and diastolic (congestive) heart failure: Secondary | ICD-10-CM | POA: Diagnosis not present

## 2019-08-13 LAB — POCT INR: INR: 2.7 (ref 2.0–3.0)

## 2019-08-13 NOTE — Patient Instructions (Addendum)
Spoke with Lucina Mellow from [facility].  Continue warfarin 1 1/2 tablets daily except 2 tablets on Mondays and Thursdays Recheck in 1 week by [facility]. Dialysis on M,W,F

## 2019-08-13 NOTE — Telephone Encounter (Signed)
Jessica with [facility] [phone]   INR  2.7  PT   32.4

## 2019-08-13 NOTE — Telephone Encounter (Signed)
Done.  See coumadin note. 

## 2019-08-15 DIAGNOSIS — N186 End stage renal disease: Secondary | ICD-10-CM | POA: Diagnosis not present

## 2019-08-15 DIAGNOSIS — Z992 Dependence on renal dialysis: Secondary | ICD-10-CM | POA: Diagnosis not present

## 2019-08-18 DIAGNOSIS — N186 End stage renal disease: Secondary | ICD-10-CM | POA: Diagnosis not present

## 2019-08-18 DIAGNOSIS — D509 Iron deficiency anemia, unspecified: Secondary | ICD-10-CM | POA: Diagnosis not present

## 2019-08-18 DIAGNOSIS — Z992 Dependence on renal dialysis: Secondary | ICD-10-CM | POA: Diagnosis not present

## 2019-08-18 DIAGNOSIS — N2581 Secondary hyperparathyroidism of renal origin: Secondary | ICD-10-CM | POA: Diagnosis not present

## 2019-08-20 ENCOUNTER — Ambulatory Visit (INDEPENDENT_AMBULATORY_CARE_PROVIDER_SITE_OTHER): Payer: Medicare HMO | Admitting: *Deleted

## 2019-08-20 DIAGNOSIS — E782 Mixed hyperlipidemia: Secondary | ICD-10-CM | POA: Diagnosis not present

## 2019-08-20 DIAGNOSIS — I4891 Unspecified atrial fibrillation: Secondary | ICD-10-CM

## 2019-08-20 DIAGNOSIS — I4821 Permanent atrial fibrillation: Secondary | ICD-10-CM

## 2019-08-20 DIAGNOSIS — K219 Gastro-esophageal reflux disease without esophagitis: Secondary | ICD-10-CM | POA: Diagnosis not present

## 2019-08-20 DIAGNOSIS — T80818D Extravasation of other vesicant agent, subsequent encounter: Secondary | ICD-10-CM | POA: Diagnosis not present

## 2019-08-20 DIAGNOSIS — Z992 Dependence on renal dialysis: Secondary | ICD-10-CM | POA: Diagnosis not present

## 2019-08-20 DIAGNOSIS — I132 Hypertensive heart and chronic kidney disease with heart failure and with stage 5 chronic kidney disease, or end stage renal disease: Secondary | ICD-10-CM | POA: Diagnosis not present

## 2019-08-20 DIAGNOSIS — I5042 Chronic combined systolic (congestive) and diastolic (congestive) heart failure: Secondary | ICD-10-CM | POA: Diagnosis not present

## 2019-08-20 DIAGNOSIS — Z5181 Encounter for therapeutic drug level monitoring: Secondary | ICD-10-CM

## 2019-08-20 DIAGNOSIS — K5790 Diverticulosis of intestine, part unspecified, without perforation or abscess without bleeding: Secondary | ICD-10-CM | POA: Diagnosis not present

## 2019-08-20 DIAGNOSIS — D631 Anemia in chronic kidney disease: Secondary | ICD-10-CM | POA: Diagnosis not present

## 2019-08-20 DIAGNOSIS — K559 Vascular disorder of intestine, unspecified: Secondary | ICD-10-CM | POA: Diagnosis not present

## 2019-08-20 DIAGNOSIS — N186 End stage renal disease: Secondary | ICD-10-CM | POA: Diagnosis not present

## 2019-08-20 LAB — POCT INR: INR: 3.1 — AB (ref 2.0–3.0)

## 2019-08-20 NOTE — Patient Instructions (Signed)
Spoke with Larene Beach RN from [facility].  Take warfarin 1 tablet tonight then resume 1 1/2 tablets daily except 2 tablets on Mondays and Thursdays Recheck in 1 week by [facility]. Dialysis on M,W,F

## 2019-08-21 DIAGNOSIS — C44612 Basal cell carcinoma of skin of right upper limb, including shoulder: Secondary | ICD-10-CM | POA: Diagnosis not present

## 2019-08-22 DIAGNOSIS — Z992 Dependence on renal dialysis: Secondary | ICD-10-CM | POA: Diagnosis not present

## 2019-08-22 DIAGNOSIS — N186 End stage renal disease: Secondary | ICD-10-CM | POA: Diagnosis not present

## 2019-08-25 DIAGNOSIS — Z992 Dependence on renal dialysis: Secondary | ICD-10-CM | POA: Diagnosis not present

## 2019-08-25 DIAGNOSIS — N186 End stage renal disease: Secondary | ICD-10-CM | POA: Diagnosis not present

## 2019-08-27 ENCOUNTER — Telehealth: Payer: Self-pay | Admitting: Cardiovascular Disease

## 2019-08-27 ENCOUNTER — Ambulatory Visit (INDEPENDENT_AMBULATORY_CARE_PROVIDER_SITE_OTHER): Payer: Medicare HMO | Admitting: *Deleted

## 2019-08-27 DIAGNOSIS — K219 Gastro-esophageal reflux disease without esophagitis: Secondary | ICD-10-CM | POA: Diagnosis not present

## 2019-08-27 DIAGNOSIS — I4821 Permanent atrial fibrillation: Secondary | ICD-10-CM | POA: Diagnosis not present

## 2019-08-27 DIAGNOSIS — I132 Hypertensive heart and chronic kidney disease with heart failure and with stage 5 chronic kidney disease, or end stage renal disease: Secondary | ICD-10-CM | POA: Diagnosis not present

## 2019-08-27 DIAGNOSIS — K559 Vascular disorder of intestine, unspecified: Secondary | ICD-10-CM | POA: Diagnosis not present

## 2019-08-27 DIAGNOSIS — T80818D Extravasation of other vesicant agent, subsequent encounter: Secondary | ICD-10-CM | POA: Diagnosis not present

## 2019-08-27 DIAGNOSIS — D631 Anemia in chronic kidney disease: Secondary | ICD-10-CM | POA: Diagnosis not present

## 2019-08-27 DIAGNOSIS — I4891 Unspecified atrial fibrillation: Secondary | ICD-10-CM | POA: Diagnosis not present

## 2019-08-27 DIAGNOSIS — E782 Mixed hyperlipidemia: Secondary | ICD-10-CM | POA: Diagnosis not present

## 2019-08-27 DIAGNOSIS — Z5181 Encounter for therapeutic drug level monitoring: Secondary | ICD-10-CM | POA: Diagnosis not present

## 2019-08-27 DIAGNOSIS — K5790 Diverticulosis of intestine, part unspecified, without perforation or abscess without bleeding: Secondary | ICD-10-CM | POA: Diagnosis not present

## 2019-08-27 DIAGNOSIS — N186 End stage renal disease: Secondary | ICD-10-CM | POA: Diagnosis not present

## 2019-08-27 DIAGNOSIS — Z992 Dependence on renal dialysis: Secondary | ICD-10-CM | POA: Diagnosis not present

## 2019-08-27 DIAGNOSIS — I5042 Chronic combined systolic (congestive) and diastolic (congestive) heart failure: Secondary | ICD-10-CM | POA: Diagnosis not present

## 2019-08-27 LAB — POCT INR: INR: 3.2 — AB (ref 2.0–3.0)

## 2019-08-27 NOTE — Patient Instructions (Signed)
Spoke with Lucina Mellow from [facility].  Take warfarin 1 tablet tonight then decrease dose to 1 1/2 tablets daily except 2 tablets on Mondays Recheck in 1 week by [facility]. Dialysis on M,W,F

## 2019-08-27 NOTE — Telephone Encounter (Signed)
PT 37.9  INR 3.2

## 2019-08-27 NOTE — Telephone Encounter (Signed)
Done.  See coumadin note. 

## 2019-08-29 DIAGNOSIS — N186 End stage renal disease: Secondary | ICD-10-CM | POA: Diagnosis not present

## 2019-08-29 DIAGNOSIS — Z992 Dependence on renal dialysis: Secondary | ICD-10-CM | POA: Diagnosis not present

## 2019-09-01 DIAGNOSIS — Z992 Dependence on renal dialysis: Secondary | ICD-10-CM | POA: Diagnosis not present

## 2019-09-01 DIAGNOSIS — N186 End stage renal disease: Secondary | ICD-10-CM | POA: Diagnosis not present

## 2019-09-03 ENCOUNTER — Ambulatory Visit (INDEPENDENT_AMBULATORY_CARE_PROVIDER_SITE_OTHER): Payer: Medicare HMO | Admitting: *Deleted

## 2019-09-03 DIAGNOSIS — K559 Vascular disorder of intestine, unspecified: Secondary | ICD-10-CM | POA: Diagnosis not present

## 2019-09-03 DIAGNOSIS — Z5181 Encounter for therapeutic drug level monitoring: Secondary | ICD-10-CM

## 2019-09-03 DIAGNOSIS — Z992 Dependence on renal dialysis: Secondary | ICD-10-CM | POA: Diagnosis not present

## 2019-09-03 DIAGNOSIS — I4891 Unspecified atrial fibrillation: Secondary | ICD-10-CM | POA: Diagnosis not present

## 2019-09-03 DIAGNOSIS — I5042 Chronic combined systolic (congestive) and diastolic (congestive) heart failure: Secondary | ICD-10-CM | POA: Diagnosis not present

## 2019-09-03 DIAGNOSIS — D631 Anemia in chronic kidney disease: Secondary | ICD-10-CM | POA: Diagnosis not present

## 2019-09-03 DIAGNOSIS — I132 Hypertensive heart and chronic kidney disease with heart failure and with stage 5 chronic kidney disease, or end stage renal disease: Secondary | ICD-10-CM | POA: Diagnosis not present

## 2019-09-03 DIAGNOSIS — K5790 Diverticulosis of intestine, part unspecified, without perforation or abscess without bleeding: Secondary | ICD-10-CM | POA: Diagnosis not present

## 2019-09-03 DIAGNOSIS — I4821 Permanent atrial fibrillation: Secondary | ICD-10-CM | POA: Diagnosis not present

## 2019-09-03 DIAGNOSIS — K219 Gastro-esophageal reflux disease without esophagitis: Secondary | ICD-10-CM | POA: Diagnosis not present

## 2019-09-03 DIAGNOSIS — T80818D Extravasation of other vesicant agent, subsequent encounter: Secondary | ICD-10-CM | POA: Diagnosis not present

## 2019-09-03 DIAGNOSIS — N186 End stage renal disease: Secondary | ICD-10-CM | POA: Diagnosis not present

## 2019-09-03 DIAGNOSIS — E782 Mixed hyperlipidemia: Secondary | ICD-10-CM | POA: Diagnosis not present

## 2019-09-03 LAB — POCT INR: INR: 2.9 (ref 2.0–3.0)

## 2019-09-03 NOTE — Patient Instructions (Signed)
Spoke with Lucina Mellow from [facility].  Continue warfarin 1 1/2 tablets daily except 2 tablets on Mondays Recheck in 2 weeks by [facility]. Dialysis on M,W,F

## 2019-09-05 DIAGNOSIS — Z992 Dependence on renal dialysis: Secondary | ICD-10-CM | POA: Diagnosis not present

## 2019-09-05 DIAGNOSIS — N186 End stage renal disease: Secondary | ICD-10-CM | POA: Diagnosis not present

## 2019-09-05 DIAGNOSIS — N2581 Secondary hyperparathyroidism of renal origin: Secondary | ICD-10-CM | POA: Diagnosis not present

## 2019-09-07 DIAGNOSIS — N186 End stage renal disease: Secondary | ICD-10-CM | POA: Diagnosis not present

## 2019-09-07 DIAGNOSIS — Z992 Dependence on renal dialysis: Secondary | ICD-10-CM | POA: Diagnosis not present

## 2019-09-08 DIAGNOSIS — N2581 Secondary hyperparathyroidism of renal origin: Secondary | ICD-10-CM | POA: Diagnosis not present

## 2019-09-08 DIAGNOSIS — N186 End stage renal disease: Secondary | ICD-10-CM | POA: Diagnosis not present

## 2019-09-08 DIAGNOSIS — Z992 Dependence on renal dialysis: Secondary | ICD-10-CM | POA: Diagnosis not present

## 2019-09-10 DIAGNOSIS — K559 Vascular disorder of intestine, unspecified: Secondary | ICD-10-CM | POA: Diagnosis not present

## 2019-09-10 DIAGNOSIS — I132 Hypertensive heart and chronic kidney disease with heart failure and with stage 5 chronic kidney disease, or end stage renal disease: Secondary | ICD-10-CM | POA: Diagnosis not present

## 2019-09-10 DIAGNOSIS — I4821 Permanent atrial fibrillation: Secondary | ICD-10-CM | POA: Diagnosis not present

## 2019-09-10 DIAGNOSIS — T80818D Extravasation of other vesicant agent, subsequent encounter: Secondary | ICD-10-CM | POA: Diagnosis not present

## 2019-09-10 DIAGNOSIS — E782 Mixed hyperlipidemia: Secondary | ICD-10-CM | POA: Diagnosis not present

## 2019-09-10 DIAGNOSIS — K219 Gastro-esophageal reflux disease without esophagitis: Secondary | ICD-10-CM | POA: Diagnosis not present

## 2019-09-10 DIAGNOSIS — K5790 Diverticulosis of intestine, part unspecified, without perforation or abscess without bleeding: Secondary | ICD-10-CM | POA: Diagnosis not present

## 2019-09-10 DIAGNOSIS — I5042 Chronic combined systolic (congestive) and diastolic (congestive) heart failure: Secondary | ICD-10-CM | POA: Diagnosis not present

## 2019-09-10 DIAGNOSIS — Z992 Dependence on renal dialysis: Secondary | ICD-10-CM | POA: Diagnosis not present

## 2019-09-10 DIAGNOSIS — N186 End stage renal disease: Secondary | ICD-10-CM | POA: Diagnosis not present

## 2019-09-10 DIAGNOSIS — D631 Anemia in chronic kidney disease: Secondary | ICD-10-CM | POA: Diagnosis not present

## 2019-09-12 DIAGNOSIS — N186 End stage renal disease: Secondary | ICD-10-CM | POA: Diagnosis not present

## 2019-09-12 DIAGNOSIS — Z992 Dependence on renal dialysis: Secondary | ICD-10-CM | POA: Diagnosis not present

## 2019-09-15 DIAGNOSIS — N186 End stage renal disease: Secondary | ICD-10-CM | POA: Diagnosis not present

## 2019-09-15 DIAGNOSIS — N2581 Secondary hyperparathyroidism of renal origin: Secondary | ICD-10-CM | POA: Diagnosis not present

## 2019-09-15 DIAGNOSIS — Z992 Dependence on renal dialysis: Secondary | ICD-10-CM | POA: Diagnosis not present

## 2019-09-17 ENCOUNTER — Telehealth: Payer: Self-pay | Admitting: *Deleted

## 2019-09-17 ENCOUNTER — Ambulatory Visit (INDEPENDENT_AMBULATORY_CARE_PROVIDER_SITE_OTHER): Payer: Medicare HMO | Admitting: Cardiovascular Disease

## 2019-09-17 DIAGNOSIS — Z5181 Encounter for therapeutic drug level monitoring: Secondary | ICD-10-CM | POA: Diagnosis not present

## 2019-09-17 DIAGNOSIS — D631 Anemia in chronic kidney disease: Secondary | ICD-10-CM | POA: Diagnosis not present

## 2019-09-17 DIAGNOSIS — I4821 Permanent atrial fibrillation: Secondary | ICD-10-CM | POA: Diagnosis not present

## 2019-09-17 DIAGNOSIS — K219 Gastro-esophageal reflux disease without esophagitis: Secondary | ICD-10-CM | POA: Diagnosis not present

## 2019-09-17 DIAGNOSIS — E782 Mixed hyperlipidemia: Secondary | ICD-10-CM | POA: Diagnosis not present

## 2019-09-17 DIAGNOSIS — K5790 Diverticulosis of intestine, part unspecified, without perforation or abscess without bleeding: Secondary | ICD-10-CM | POA: Diagnosis not present

## 2019-09-17 DIAGNOSIS — N186 End stage renal disease: Secondary | ICD-10-CM | POA: Diagnosis not present

## 2019-09-17 DIAGNOSIS — I132 Hypertensive heart and chronic kidney disease with heart failure and with stage 5 chronic kidney disease, or end stage renal disease: Secondary | ICD-10-CM | POA: Diagnosis not present

## 2019-09-17 DIAGNOSIS — Z992 Dependence on renal dialysis: Secondary | ICD-10-CM | POA: Diagnosis not present

## 2019-09-17 DIAGNOSIS — K559 Vascular disorder of intestine, unspecified: Secondary | ICD-10-CM | POA: Diagnosis not present

## 2019-09-17 DIAGNOSIS — T80818D Extravasation of other vesicant agent, subsequent encounter: Secondary | ICD-10-CM | POA: Diagnosis not present

## 2019-09-17 DIAGNOSIS — I5042 Chronic combined systolic (congestive) and diastolic (congestive) heart failure: Secondary | ICD-10-CM | POA: Diagnosis not present

## 2019-09-17 LAB — POCT INR: INR: 2.1 (ref 2.0–3.0)

## 2019-09-17 NOTE — Telephone Encounter (Signed)
Currently taking 10 mg on Mondays and 7.5 mg all other days All negative to questions

## 2019-09-17 NOTE — Patient Instructions (Addendum)
Description   Spoke with Larene Beach from [facility], instructed for pt to continue warfarin 1.5  tablets daily except 2 tablets on Mondays.  Recheck INR in 1 week by [facility] and to call results to [place] at [phone] Dialysis on M,W,F

## 2019-09-17 NOTE — Telephone Encounter (Signed)
See Anti-coag encounter from 09/17/2019.

## 2019-09-19 DIAGNOSIS — Z992 Dependence on renal dialysis: Secondary | ICD-10-CM | POA: Diagnosis not present

## 2019-09-19 DIAGNOSIS — N186 End stage renal disease: Secondary | ICD-10-CM | POA: Diagnosis not present

## 2019-09-22 ENCOUNTER — Emergency Department (HOSPITAL_COMMUNITY): Payer: Medicare HMO

## 2019-09-22 ENCOUNTER — Inpatient Hospital Stay (HOSPITAL_COMMUNITY)
Admission: EM | Admit: 2019-09-22 | Discharge: 2019-09-30 | DRG: 871 | Disposition: A | Discharge status: Home Health | Payer: Medicare HMO | Attending: Family Medicine | Admitting: Family Medicine

## 2019-09-22 ENCOUNTER — Other Ambulatory Visit: Payer: Self-pay

## 2019-09-22 ENCOUNTER — Encounter (HOSPITAL_COMMUNITY): Payer: Self-pay | Admitting: *Deleted

## 2019-09-22 DIAGNOSIS — Z20822 Contact with and (suspected) exposure to covid-19: Secondary | ICD-10-CM | POA: Diagnosis present

## 2019-09-22 DIAGNOSIS — I4821 Permanent atrial fibrillation: Secondary | ICD-10-CM | POA: Diagnosis present

## 2019-09-22 DIAGNOSIS — I132 Hypertensive heart and chronic kidney disease with heart failure and with stage 5 chronic kidney disease, or end stage renal disease: Secondary | ICD-10-CM | POA: Diagnosis present

## 2019-09-22 DIAGNOSIS — Z7901 Long term (current) use of anticoagulants: Secondary | ICD-10-CM

## 2019-09-22 DIAGNOSIS — J9601 Acute respiratory failure with hypoxia: Secondary | ICD-10-CM | POA: Diagnosis present

## 2019-09-22 DIAGNOSIS — I361 Nonrheumatic tricuspid (valve) insufficiency: Secondary | ICD-10-CM | POA: Diagnosis not present

## 2019-09-22 DIAGNOSIS — Z90710 Acquired absence of both cervix and uterus: Secondary | ICD-10-CM | POA: Diagnosis not present

## 2019-09-22 DIAGNOSIS — Z79899 Other long term (current) drug therapy: Secondary | ICD-10-CM

## 2019-09-22 DIAGNOSIS — J440 Chronic obstructive pulmonary disease with acute lower respiratory infection: Secondary | ICD-10-CM | POA: Diagnosis present

## 2019-09-22 DIAGNOSIS — Z87891 Personal history of nicotine dependence: Secondary | ICD-10-CM | POA: Diagnosis not present

## 2019-09-22 DIAGNOSIS — I428 Other cardiomyopathies: Secondary | ICD-10-CM | POA: Diagnosis present

## 2019-09-22 DIAGNOSIS — R0600 Dyspnea, unspecified: Secondary | ICD-10-CM

## 2019-09-22 DIAGNOSIS — I959 Hypotension, unspecified: Secondary | ICD-10-CM | POA: Diagnosis not present

## 2019-09-22 DIAGNOSIS — N186 End stage renal disease: Secondary | ICD-10-CM | POA: Diagnosis not present

## 2019-09-22 DIAGNOSIS — R0602 Shortness of breath: Secondary | ICD-10-CM | POA: Diagnosis not present

## 2019-09-22 DIAGNOSIS — D649 Anemia, unspecified: Secondary | ICD-10-CM | POA: Diagnosis present

## 2019-09-22 DIAGNOSIS — Z992 Dependence on renal dialysis: Secondary | ICD-10-CM | POA: Diagnosis not present

## 2019-09-22 DIAGNOSIS — I340 Nonrheumatic mitral (valve) insufficiency: Secondary | ICD-10-CM | POA: Diagnosis not present

## 2019-09-22 DIAGNOSIS — A419 Sepsis, unspecified organism: Secondary | ICD-10-CM | POA: Diagnosis not present

## 2019-09-22 DIAGNOSIS — I5022 Chronic systolic (congestive) heart failure: Secondary | ICD-10-CM

## 2019-09-22 DIAGNOSIS — I953 Hypotension of hemodialysis: Secondary | ICD-10-CM | POA: Diagnosis not present

## 2019-09-22 DIAGNOSIS — R0689 Other abnormalities of breathing: Secondary | ICD-10-CM

## 2019-09-22 DIAGNOSIS — I10 Essential (primary) hypertension: Secondary | ICD-10-CM | POA: Diagnosis not present

## 2019-09-22 DIAGNOSIS — I5043 Acute on chronic combined systolic (congestive) and diastolic (congestive) heart failure: Secondary | ICD-10-CM | POA: Diagnosis present

## 2019-09-22 DIAGNOSIS — I4891 Unspecified atrial fibrillation: Secondary | ICD-10-CM | POA: Diagnosis present

## 2019-09-22 DIAGNOSIS — K219 Gastro-esophageal reflux disease without esophagitis: Secondary | ICD-10-CM | POA: Diagnosis present

## 2019-09-22 DIAGNOSIS — D631 Anemia in chronic kidney disease: Secondary | ICD-10-CM | POA: Diagnosis not present

## 2019-09-22 DIAGNOSIS — Z8542 Personal history of malignant neoplasm of other parts of uterus: Secondary | ICD-10-CM | POA: Diagnosis not present

## 2019-09-22 DIAGNOSIS — N2581 Secondary hyperparathyroidism of renal origin: Secondary | ICD-10-CM | POA: Diagnosis not present

## 2019-09-22 DIAGNOSIS — J189 Pneumonia, unspecified organism: Secondary | ICD-10-CM | POA: Diagnosis present

## 2019-09-22 DIAGNOSIS — I5032 Chronic diastolic (congestive) heart failure: Secondary | ICD-10-CM | POA: Diagnosis not present

## 2019-09-22 DIAGNOSIS — I952 Hypotension due to drugs: Secondary | ICD-10-CM | POA: Diagnosis not present

## 2019-09-22 DIAGNOSIS — I120 Hypertensive chronic kidney disease with stage 5 chronic kidney disease or end stage renal disease: Secondary | ICD-10-CM | POA: Diagnosis not present

## 2019-09-22 DIAGNOSIS — E782 Mixed hyperlipidemia: Secondary | ICD-10-CM | POA: Diagnosis present

## 2019-09-22 DIAGNOSIS — I5042 Chronic combined systolic (congestive) and diastolic (congestive) heart failure: Secondary | ICD-10-CM | POA: Diagnosis not present

## 2019-09-22 LAB — BASIC METABOLIC PANEL
Anion gap: 20 — ABNORMAL HIGH (ref 5–15)
BUN: 62 mg/dL — ABNORMAL HIGH (ref 8–23)
CO2: 21 mmol/L — ABNORMAL LOW (ref 22–32)
Calcium: 10 mg/dL (ref 8.9–10.3)
Chloride: 96 mmol/L — ABNORMAL LOW (ref 98–111)
Creatinine, Ser: 12.94 mg/dL — ABNORMAL HIGH (ref 0.44–1.00)
GFR calc Af Amer: 3 mL/min — ABNORMAL LOW (ref >60)
GFR calc non Af Amer: 2 mL/min — ABNORMAL LOW (ref >60)
Glucose, Bld: 165 mg/dL — ABNORMAL HIGH (ref 70–99)
Potassium: 4.7 mmol/L (ref 3.5–5.1)
Sodium: 137 mmol/L (ref 135–145)

## 2019-09-22 LAB — RESPIRATORY PANEL BY RT PCR (FLU A&B, COVID)
Influenza A by PCR: NEGATIVE
Influenza B by PCR: NEGATIVE
SARS Coronavirus 2 by RT PCR: NEGATIVE

## 2019-09-22 LAB — LACTIC ACID, PLASMA
Lactic Acid, Venous: 2.2 mmol/L (ref 0.5–1.9)
Lactic Acid, Venous: 2.3 mmol/L (ref 0.5–1.9)

## 2019-09-22 LAB — CBC WITH DIFFERENTIAL/PLATELET
Abs Immature Granulocytes: 0.14 10*3/uL — ABNORMAL HIGH (ref 0.00–0.07)
Basophils Absolute: 0.1 10*3/uL (ref 0.0–0.1)
Basophils Relative: 1 %
Eosinophils Absolute: 0.1 10*3/uL (ref 0.0–0.5)
Eosinophils Relative: 1 %
HCT: 35.7 % — ABNORMAL LOW (ref 36.0–46.0)
Hemoglobin: 11.6 g/dL — ABNORMAL LOW (ref 12.0–15.0)
Immature Granulocytes: 1 %
Lymphocytes Relative: 21 %
Lymphs Abs: 2.4 10*3/uL (ref 0.7–4.0)
MCH: 37.2 pg — ABNORMAL HIGH (ref 26.0–34.0)
MCHC: 32.5 g/dL (ref 30.0–36.0)
MCV: 114.4 fL — ABNORMAL HIGH (ref 80.0–100.0)
Monocytes Absolute: 0.9 10*3/uL (ref 0.1–1.0)
Monocytes Relative: 8 %
Neutro Abs: 7.4 10*3/uL (ref 1.7–7.7)
Neutrophils Relative %: 68 %
Platelets: 171 10*3/uL (ref 150–400)
RBC: 3.12 MIL/uL — ABNORMAL LOW (ref 3.87–5.11)
RDW: 14.7 % (ref 11.5–15.5)
WBC: 11 10*3/uL — ABNORMAL HIGH (ref 4.0–10.5)
nRBC: 0 % (ref 0.0–0.2)

## 2019-09-22 LAB — MAGNESIUM: Magnesium: 1.8 mg/dL (ref 1.7–2.4)

## 2019-09-22 LAB — PROTIME-INR
INR: 2.1 — ABNORMAL HIGH (ref 0.8–1.2)
Prothrombin Time: 23.6 seconds — ABNORMAL HIGH (ref 11.4–15.2)

## 2019-09-22 LAB — HEPATITIS B SURFACE ANTIGEN: Hepatitis B Surface Ag: NONREACTIVE

## 2019-09-22 LAB — TSH: TSH: 4.301 u[IU]/mL (ref 0.350–4.500)

## 2019-09-22 MED ORDER — SODIUM CHLORIDE 0.9 % IV SOLN
1.0000 g | INTRAVENOUS | Status: DC
  Administered 2019-09-23 – 2019-09-29 (×7): 1 g via INTRAVENOUS
  Filled 2019-09-22 (×7): qty 10

## 2019-09-22 MED ORDER — LIDOCAINE-PRILOCAINE 2.5-2.5 % EX CREA: 1.0000 "application " | TOPICAL_CREAM | CUTANEOUS | Status: DC | PRN

## 2019-09-22 MED ORDER — SODIUM CHLORIDE 0.9 % IV SOLN: 100.0000 mL | INTRAVENOUS | Status: DC | PRN

## 2019-09-22 MED ORDER — PENTAFLUOROPROP-TETRAFLUOROETH EX AERO: 1.0000 "application " | INHALATION_SPRAY | CUTANEOUS | Status: DC | PRN

## 2019-09-22 MED ORDER — SODIUM CHLORIDE 0.9 % IV SOLN
250.0000 mL | INTRAVENOUS | Status: DC | PRN
  Administered 2019-09-23: 250 mL via INTRAVENOUS

## 2019-09-22 MED ORDER — SEVELAMER CARBONATE 800 MG PO TABS
2400.0000 mg | ORAL_TABLET | Freq: Three times a day (TID) | ORAL | Status: DC
  Administered 2019-09-23 – 2019-09-30 (×22): 2400 mg via ORAL
  Filled 2019-09-22 (×27): qty 3

## 2019-09-22 MED ORDER — SODIUM CHLORIDE 0.9% FLUSH
3.0000 mL | Freq: Two times a day (BID) | INTRAVENOUS | Status: DC
  Administered 2019-09-22 – 2019-09-30 (×17): 3 mL via INTRAVENOUS

## 2019-09-22 MED ORDER — GUAIFENESIN ER 600 MG PO TB12
600.0000 mg | ORAL_TABLET | Freq: Two times a day (BID) | ORAL | Status: DC
  Administered 2019-09-22 – 2019-09-30 (×17): 600 mg via ORAL
  Filled 2019-09-22 (×17): qty 1

## 2019-09-22 MED ORDER — OMEGA-3-ACID ETHYL ESTERS 1 G PO CAPS
1000.0000 mg | ORAL_CAPSULE | Freq: Two times a day (BID) | ORAL | Status: DC
  Administered 2019-09-22 – 2019-09-30 (×16): 1000 mg via ORAL
  Filled 2019-09-22 (×21): qty 1

## 2019-09-22 MED ORDER — NITROGLYCERIN IN D5W 200-5 MCG/ML-% IV SOLN
5.0000 ug/min | INTRAVENOUS | Status: DC
  Administered 2019-09-22: 5 ug/min via INTRAVENOUS
  Filled 2019-09-22: qty 250

## 2019-09-22 MED ORDER — VANCOMYCIN HCL 1500 MG/300ML IV SOLN
1500.0000 mg | Freq: Once | INTRAVENOUS | Status: AC
  Administered 2019-09-22: 10:00:00 1500 mg via INTRAVENOUS
  Filled 2019-09-22: qty 300

## 2019-09-22 MED ORDER — POLYVINYL ALCOHOL 1.4 % OP SOLN
1.0000 [drp] | Freq: Three times a day (TID) | OPHTHALMIC | Status: DC | PRN
  Filled 2019-09-22: qty 15

## 2019-09-22 MED ORDER — WARFARIN SODIUM 5 MG PO TABS: 10.0000 mg | ORAL_TABLET | Freq: Once | ORAL | Status: AC

## 2019-09-22 MED ORDER — SODIUM CHLORIDE 0.9 % IV SOLN
1.0000 g | Freq: Once | INTRAVENOUS | Status: AC
  Administered 2019-09-22: 09:00:00 1 g via INTRAVENOUS
  Filled 2019-09-22: qty 10

## 2019-09-22 MED ORDER — GABAPENTIN 100 MG PO CAPS
100.0000 mg | ORAL_CAPSULE | Freq: Two times a day (BID) | ORAL | Status: DC
  Administered 2019-09-22 – 2019-09-30 (×17): 100 mg via ORAL
  Filled 2019-09-22 (×17): qty 1

## 2019-09-22 MED ORDER — VITAMIN D 25 MCG (1000 UNIT) PO TABS
2000.0000 [IU] | ORAL_TABLET | Freq: Every evening | ORAL | Status: DC
  Administered 2019-09-23 – 2019-09-29 (×7): 2000 [IU] via ORAL
  Filled 2019-09-22 (×11): qty 2

## 2019-09-22 MED ORDER — SODIUM CHLORIDE 0.9 % IV SOLN: 500.0000 mg | INTRAVENOUS | Status: DC

## 2019-09-22 MED ORDER — AMIODARONE LOAD VIA INFUSION
150.0000 mg | Freq: Once | INTRAVENOUS | Status: AC
  Administered 2019-09-22: 19:00:00 150 mg via INTRAVENOUS
  Filled 2019-09-22: qty 83.34

## 2019-09-22 MED ORDER — ACETAMINOPHEN 325 MG PO TABS: 650.0000 mg | ORAL_TABLET | Freq: Four times a day (QID) | ORAL | Status: DC | PRN

## 2019-09-22 MED ORDER — AMIODARONE HCL IN DEXTROSE 360-4.14 MG/200ML-% IV SOLN
60.0000 mg/h | INTRAVENOUS | Status: AC
  Administered 2019-09-22 (×2): 60 mg/h via INTRAVENOUS

## 2019-09-22 MED ORDER — ONDANSETRON HCL 4 MG/2ML IJ SOLN: 4.0000 mg | Freq: Four times a day (QID) | INTRAMUSCULAR | Status: DC | PRN

## 2019-09-22 MED ORDER — OCUVITE-LUTEIN PO CAPS
1.0000 | ORAL_CAPSULE | Freq: Two times a day (BID) | ORAL | Status: DC
  Administered 2019-09-22 – 2019-09-30 (×16): 1 via ORAL
  Filled 2019-09-22 (×21): qty 1

## 2019-09-22 MED ORDER — VANCOMYCIN HCL IN DEXTROSE 1-5 GM/200ML-% IV SOLN: 1000.0000 mg | INTRAVENOUS | Status: DC

## 2019-09-22 MED ORDER — ACETAMINOPHEN 650 MG RE SUPP: 650.0000 mg | Freq: Four times a day (QID) | RECTAL | Status: DC | PRN

## 2019-09-22 MED ORDER — PANTOPRAZOLE SODIUM 40 MG PO TBEC
40.0000 mg | DELAYED_RELEASE_TABLET | Freq: Every day | ORAL | Status: DC
  Administered 2019-09-22 – 2019-09-27 (×6): 40 mg via ORAL
  Filled 2019-09-22 (×6): qty 1

## 2019-09-22 MED ORDER — LIDOCAINE HCL (PF) 1 % IJ SOLN: 5.0000 mL | INTRAMUSCULAR | Status: DC | PRN

## 2019-09-22 MED ORDER — METOPROLOL TARTRATE 25 MG PO TABS
25.0000 mg | ORAL_TABLET | Freq: Two times a day (BID) | ORAL | Status: DC
  Administered 2019-09-22 (×2): 25 mg via ORAL
  Filled 2019-09-22 (×2): qty 1

## 2019-09-22 MED ORDER — WARFARIN - PHARMACIST DOSING INPATIENT: Freq: Every day | Status: DC

## 2019-09-22 MED ORDER — SODIUM CHLORIDE 0.9 % IV SOLN
500.0000 mg | INTRAVENOUS | Status: AC
  Administered 2019-09-23 – 2019-09-27 (×5): 500 mg via INTRAVENOUS
  Filled 2019-09-22 (×5): qty 500

## 2019-09-22 MED ORDER — GUAIFENESIN-DM 100-10 MG/5ML PO SYRP: 10.0000 mL | ORAL_SOLUTION | ORAL | Status: DC | PRN

## 2019-09-22 MED ORDER — SODIUM CHLORIDE 0.9 % IV SOLN: 1.0000 g | INTRAVENOUS | Status: DC

## 2019-09-22 MED ORDER — SODIUM CHLORIDE 0.9 % IV SOLN
500.0000 mg | Freq: Once | INTRAVENOUS | Status: AC
  Administered 2019-09-22: 09:00:00 500 mg via INTRAVENOUS
  Filled 2019-09-22: qty 500

## 2019-09-22 MED ORDER — LEVALBUTEROL HCL 0.63 MG/3ML IN NEBU
0.6300 mg | INHALATION_SOLUTION | Freq: Four times a day (QID) | RESPIRATORY_TRACT | Status: DC
  Administered 2019-09-22 – 2019-09-23 (×4): 0.63 mg via RESPIRATORY_TRACT
  Filled 2019-09-22 (×4): qty 3

## 2019-09-22 MED ORDER — ONDANSETRON HCL 4 MG PO TABS: 4.0000 mg | ORAL_TABLET | Freq: Four times a day (QID) | ORAL | Status: DC | PRN

## 2019-09-22 MED ORDER — SODIUM CHLORIDE 0.9% FLUSH: 3.0000 mL | INTRAVENOUS | Status: DC | PRN

## 2019-09-22 MED ORDER — POLYETHYLENE GLYCOL 3350 17 G PO PACK: 17.0000 g | PACK | Freq: Every day | ORAL | Status: DC | PRN

## 2019-09-22 MED ORDER — RENA-VITE PO TABS
1.0000 | ORAL_TABLET | Freq: Every evening | ORAL | Status: DC
  Administered 2019-09-24 – 2019-09-29 (×6): 1 via ORAL
  Filled 2019-09-22 (×10): qty 1

## 2019-09-22 MED ORDER — AMIODARONE HCL IN DEXTROSE 360-4.14 MG/200ML-% IV SOLN
30.0000 mg/h | INTRAVENOUS | Status: DC
  Administered 2019-09-23 – 2019-09-25 (×6): 30 mg/h via INTRAVENOUS
  Filled 2019-09-22 (×7): qty 200

## 2019-09-22 MED ORDER — CHLORHEXIDINE GLUCONATE CLOTH 2 % EX PADS
6.0000 | MEDICATED_PAD | Freq: Every day | CUTANEOUS | Status: DC
  Administered 2019-09-30: 6 via TOPICAL

## 2019-09-22 MED ORDER — BUDESONIDE 0.5 MG/2ML IN SUSP
0.5000 mg | Freq: Two times a day (BID) | RESPIRATORY_TRACT | Status: DC
  Administered 2019-09-22 – 2019-09-30 (×17): 0.5 mg via RESPIRATORY_TRACT
  Filled 2019-09-22 (×16): qty 2

## 2019-09-22 MED ORDER — TRAZODONE HCL 50 MG PO TABS
50.0000 mg | ORAL_TABLET | Freq: Every evening | ORAL | Status: DC | PRN
  Administered 2019-09-24 – 2019-09-25 (×2): 50 mg via ORAL
  Filled 2019-09-22 (×3): qty 1

## 2019-09-22 MED ORDER — DILTIAZEM HCL-DEXTROSE 125-5 MG/125ML-% IV SOLN (PREMIX)
5.0000 mg/h | INTRAVENOUS | Status: DC
  Administered 2019-09-22: 07:00:00 5 mg/h via INTRAVENOUS
  Filled 2019-09-22: qty 125

## 2019-09-22 NOTE — H&P (Signed)
Patient Demographics:    Sabrina Beck, is a 79 y.o. female  MRN: [id number]   DOB - [birth year]  Admit Date - 09/22/2019  Outpatient Primary MD for the patient is Redmond School, MD   Assessment & Plan:    Principal Problem:   PNA (pneumonia) Active Problems:   Atrial fibrillation ([place])   Long term current use of anticoagulant therapy   ESRD (end stage renal disease) ([place])   Anemia    1)Bilateral community-acquired pneumonia with hypoxic respiratory failure--- IV Rocephin and azithromycin as ordered, bronchodilators and mucolytics as ordered -Supplemental oxygen as ordered  2) chronic Afib --- with RVR, last known EF 45%, patient was treated with IV Cardizem became hypotensive Cardizem was discontinued, tachycardia reoccurred, IV Cardizem was tried again patient became hypotensive again -We will stop Cardizem at this time and switch to IV amiodarone -TSH is 4.3, magnesium is pending -Coumadin for anticoagulation, Coumadin is 2.1 -Cardiology consult in a.m.  3) sepsis secondary to pneumonia in a hemodialysis patient--- unable to be required IV fluid boluses due to hemodialysis/ESRD status -Patient met sepsis criteria with tachycardia, tachypnea as well as hypoxia, on leukocytosis, elevated lactic acid, -Treat as above #1  4)ESRD--nephrology consult for HD appreciated patient usually gets HD Monday Wednesdays and Fridays, she has not missed any HD sessions  5)-anemia of CKD--- hemoglobin is stable at 11.6, EPO/ESA agent per nephrology team  6) acute hypoxic respiratory failure secondary to #1 #2 above--- currently requiring 4 L of oxygen, hopefully hypoxia will improve with hemodialysis and treatment of pneumonia as above #1  With History of - Reviewed by me  Past Medical History:  Diagnosis Date  .  Anemia   . Atrial fibrillation ([place])   . Chest pain 05/15/2006   Stress test negative for ischemia  . Diastolic heart failure ([place])   . Diverticulosis   . ESRD on hemodialysis ([place])   . GERD (gastroesophageal reflux disease)   . Headache   . Mixed hyperlipidemia   . Peripheral neuropathy   . PONV (postoperative nausea and vomiting)   . Systemic hypertension   . Uterine cancer [facility])       Past Surgical History:  Procedure Laterality Date  . ABDOMINAL HYSTERECTOMY    . APPENDECTOMY    . AV FISTULA PLACEMENT Left 11/17/2013   Procedure: CREATION OF LEFT RADIOCEPHALIC ARTERIOVENOUS (AV) FISTULA ;  Surgeon: [name], MD;  Location: [facility] [place];  Service: Vascular;  Laterality: Left;  . BREAST SURGERY Left    tumors non canerous 2- 3 different surgeries  . CATARACT EXTRACTION W/PHACO Right 10/26/2016   Procedure: CATARACT EXTRACTION PHACO AND INTRAOCULAR LENS PLACEMENT (IOC) CDE= 11.91;  Surgeon: Tonny Branch, MD;  Location: [facility];  Service: Ophthalmology;  Laterality: Right;  right - pt knows to arrive at 6:30  . CATARACT EXTRACTION W/PHACO Left 11/30/2016   Procedure: CATARACT EXTRACTION PHACO AND INTRAOCULAR LENS PLACEMENT (IOC) CDE - 7.92 ;  Surgeon:  Tonny Branch, MD;  Location: [facility];  Service: Ophthalmology;  Laterality: Left;  left  . CHOLECYSTECTOMY    . COLONOSCOPY W/ BIOPSIES AND POLYPECTOMY    . FISTULA SUPERFICIALIZATION Left 05/19/2014   Procedure: FISTULA SUPERFICIALIZATION-LEFT ARM;  Surgeon: [name], MD;  Location: [place];  Service: Vascular;  Laterality: Left;  . INSERTION OF DIALYSIS CATHETER N/A 01/24/2014   Procedure: INSERTION OF DIALYSIS CATHETER;  Surgeon: Rosetta Posner, MD;  Location[facility];  Service: Vascular;  Laterality: N/A;  . KNEE ARTHROSCOPY    . TUBAL LIGATION        Chief Complaint  Patient presents with  . Shortness of Breath      HPI:    [name]  is a 79 y.o. female with past medical history relevant for chronic A. fib on Coumadin  for anticoagulation and metoprolol for rate control, as well as history of ESRD with hemodialysis on Mondays Wednesdays and Fridays, COPD, dCHF and anemia of ESRD who presents with shortness of breath and found to be hypoxic in the ED requiring up to 4 L of oxygen via nasal cannula in the setting of bilateral pneumonia -Patient met sepsis criteria with tachycardia, tachypnea as well as hypoxia, on leukocytosis, elevated lactic acid, -Chest x-ray consistent with bilateral pneumonia -Patient is found to be in A. fib with RVR with heart rate in the 140s in the ED initially treated with IV Cardizem but had hypotension -INR is 2.1, WBC is 11     Review of systems:    In addition to the HPI above,   A full Review of  Systems was done, all other systems reviewed are negative except as noted above in HPI , .    Social History:  Reviewed by me    Social History   Tobacco Use  . Smoking status: Former Smoker    Quit date: 08/08/1999    Years since quitting: 20.1  . Smokeless tobacco: Never Used  Substance Use Topics  . Alcohol use: No    Alcohol/week: 0.0 standard drinks       Family History :  Reviewed by me    Family History  Problem Relation Age of Onset  . Diabetes Mother   . Hypertension Mother   . Diabetes Father   . Heart disease Father        before age 72  . Heart attack Father   . Diabetes Sister   . Hyperlipidemia Sister   . Hypertension Sister   . Cancer Brother   . Diabetes Brother   . Heart disease Brother   . Hypertension Brother   . Heart attack Brother   . Colon cancer Neg Hx      Home Medications:   Prior to Admission medications   Medication Sig Start Date End Date Taking? Authorizing Provider  acetaminophen (TYLENOL) 500 MG tablet Take 1,000 mg by mouth every 6 (six) hours as needed for moderate pain.    Yes [provider]  albuterol (VENTOLIN HFA) 108 (90 Base) MCG/ACT inhaler Inhale 2 puffs into the lungs every 4 (four) hours as needed  for wheezing or shortness of breath (cough, shortness of breath or wheezing.). 04/25/19  Yes Johnson, Clanford L, MD  Cholecalciferol (VITAMIN D) 2000 UNITS CAPS Take 2,000 Units by mouth every evening.    Yes [provider]  fluticasone (FLOVENT HFA) 44 MCG/ACT inhaler Inhale 2 puffs into the lungs 2 (two) times daily. 04/25/19 04/24/20 Yes Murlean Iba, MD  gabapentin (NEURONTIN) 100 MG capsule Take 100 mg by mouth 2 (two) times daily.  09/13/18  Yes [provider]  metoprolol tartrate (LOPRESSOR) 25 MG tablet Take 1 tablet (25 mg total) by mouth 2 (two) times daily. 04/25/19  Yes Johnson, Clanford L, MD  Multiple Vitamins-Minerals (PRESERVISION AREDS 2 PO) Take 1 tablet by mouth 2 (two) times daily.   Yes [provider]  multivitamin (RENA-VIT) TABS tablet Take 1 tablet by mouth every evening.  07/06/18  Yes [provider]  Omega-3 Fatty Acids (OMEGA-3 FISH OIL) 300 MG CAPS Take 300 mg by mouth 2 (two) times daily.   Yes [provider]  omeprazole (PRILOSEC) 20 MG capsule Take 20 mg by mouth every morning.    Yes [provider]  Propylene Glycol (SYSTANE BALANCE) 0.6 % SOLN Place 1 drop into both eyes 3 (three) times daily as needed (for burning or dry eyes).    Yes [provider]  RENVELA 800 MG tablet Take 2,400 mg by mouth 3 (three) times daily with meals. Takes 1 tablet with snacks 04/17/15  Yes [provider]  warfarin (COUMADIN) 5 MG tablet TAKE 1 & 1/2 TABLETS BY MOUTH (ONE & ONE-HALF) TO 2 TABLETS ONCE DAILY AS DIRECTED BY COUMADIN CLINIC Patient taking differently: Take 5-7.5 mg by mouth daily at 6 PM. TAKE 1 TABLET BY MOUTH ON Monday AND 1 1/2 TABLETS BY MOUTH (ONE & ONE-HALF) ON Tuesday, Wednesday, Thursday,  Friday, Saturday, Sunday. 08/05/19  Yes Lendon Colonel, NP  predniSONE (STERAPRED UNI-PAK 21 TAB) 10 MG (21) TBPK tablet Take by mouth daily. Take 6 tabs by mouth daily  for 2 days, then 5 tabs for 2  days, then 4 tabs for 2 days, then 3 tabs for 2 days, 2 tabs for 2 days, then 1 tab by mouth daily for 2 days Patient not taking: Reported on 09/22/2019 07/02/19   Stacey Drain [place], PA-C     Allergies:    No Known Allergies   Physical Exam:   Vitals  Blood pressure (!) 75/54, pulse (!) 142, temperature 97.9 F (36.6 C), temperature source Oral, resp. rate (!) 27, height 5' 4" (1.626 m), weight 97.5 kg, SpO2 96 %.  Physical Examination: General appearance - alert,   and in no distress and  Mental status - alert, oriented to person, place, and time,  Eyes - sclera anicteric Nose- 4l/min Neck - supple,   Chest -diminished in bases, scattered rhonchi bilaterally, tachypneic with respiratory rate of 28 Heart - S1 and S2 normal, irregularly irregular, heart rate in 140s Abdomen - soft, nontender, nondistended, no masses or organomegaly Neurological - screening mental status exam normal, neck supple without rigidity, cranial nerves II through XII intact, DTR's normal and symmetric Extremities - no pedal edema noted, intact peripheral pulses  Skin - warm, dry     Data Review:    CBC Recent Labs  Lab 09/22/19 0530  WBC 11.0*  HGB 11.6*  HCT 35.7*  PLT 171  MCV 114.4*  MCH 37.2*  MCHC 32.5  RDW 14.7  LYMPHSABS 2.4  MONOABS 0.9  EOSABS 0.1  BASOSABS 0.1   ------------------------------------------------------------------------------------------------------------------  Chemistries  Recent Labs  Lab 09/22/19 0530  NA 137  K 4.7  CL 96*  CO2 21*  GLUCOSE 165*  BUN 62*  CREATININE 12.94*  CALCIUM 10.0   ------------------------------------------------------------------------------------------------------------------ estimated creatinine clearance is 3.9 mL/min (A) (by C-G formula based on SCr of 12.94 mg/dL (H)). ------------------------------------------------------------------------------------------------------------------ No results for input(s): TSH,  T4TOTAL,  T3FREE, THYROIDAB in the last 72 hours.  Invalid input(s): FREET3   Coagulation profile Recent Labs  Lab 09/17/19 0000 09/22/19 0530  INR 2.1 2.1*   ------------------------------------------------------------------------------------------------------------------- No results for input(s): DDIMER in the last 72 hours. -------------------------------------------------------------------------------------------------------------------  Cardiac Enzymes No results for input(s): CKMB, TROPONINI, MYOGLOBIN in the last 168 hours.  Invalid input(s): CK ------------------------------------------------------------------------------------------------------------------    Component Value Date/Time   BNP 344.0 (H) 04/08/2019 1600     ---------------------------------------------------------------------------------------------------------------  Urinalysis    Component Value Date/Time   COLORURINE YELLOW 03/03/2012 1500   APPEARANCEUR CLEAR 03/03/2012 1500   LABSPEC 1.020 03/03/2012 1500   PHURINE 6.0 03/03/2012 1500   GLUCOSEU 100 (A) 03/03/2012 1500   HGBUR TRACE (A) 03/03/2012 1500   BILIRUBINUR NEGATIVE 03/03/2012 1500   KETONESUR NEGATIVE 03/03/2012 1500   PROTEINUR 100 (A) 03/03/2012 1500   UROBILINOGEN 0.2 03/03/2012 1500   NITRITE NEGATIVE 03/03/2012 1500   LEUKOCYTESUR NEGATIVE 03/03/2012 1500    ----------------------------------------------------------------------------------------------------------------   Imaging Results:    DG Chest Port 1 View  Result Date: 09/22/2019 CLINICAL DATA:  Shortness of breath EXAM: PORTABLE CHEST 1 VIEW COMPARISON:  04/20/2019 FINDINGS: Patchy bilateral airspace opacity most dense in the peripheral left lung. Cardiomegaly. No effusion or pneumothorax. IMPRESSION: 1. Patchy bilateral pneumonia. 2. Chronic cardiomegaly. Electronically Signed   By: Monte Fantasia M.D.   On: 09/22/2019 05:55    Radiological Exams on Admission: DG Chest  Port 1 View  Result Date: 09/22/2019 CLINICAL DATA:  Shortness of breath EXAM: PORTABLE CHEST 1 VIEW COMPARISON:  04/20/2019 FINDINGS: Patchy bilateral airspace opacity most dense in the peripheral left lung. Cardiomegaly. No effusion or pneumothorax. IMPRESSION: 1. Patchy bilateral pneumonia. 2. Chronic cardiomegaly. Electronically Signed   By: Monte Fantasia M.D.   On: 09/22/2019 05:55    DVT Prophylaxis -SCD /heparin AM Labs Ordered, also please review Full Orders  Family Communication: Admission, patients condition and plan of care including tests being ordered have been discussed with the patient  who indicate understanding and agree with the plan   Code Status - Full Code  Likely DC to home over the next couple of days if significant improvement in respiratory and cardiovascular status  Condition   fair  Roxan Hockey M.D on 09/22/2019 at 6:57 PM Go to [email or web address] -  for contact info  [facility] - Office  [phone]

## 2019-09-22 NOTE — Progress Notes (Signed)
ANTIBIOTIC CONSULT NOTE-Preliminary  Pharmacy Consult for vancomycin Indication: pneumonia  No Known Allergies  Patient Measurements: Height: 5\' 4"  (162.6 cm) Weight: 215 lb (97.5 kg) IBW/kg (Calculated) : 54.7 Adjusted Body Weight: 67 kg  Vital Signs: Temp: 98.2 F (36.8 C) (02/15 0524) Temp Source: Oral (02/15 0524) BP: 148/89 (02/15 0600) Pulse Rate: 123 (02/15 0645)  Labs: Recent Labs    09/22/19 0530  WBC 11.0*  HGB 11.6*  PLT 171  CREATININE 12.94*    Estimated Creatinine Clearance: 3.9 mL/min (A) (by C-G formula based on SCr of 12.94 mg/dL (H)).  No results for input(s): VANCOTROUGH, VANCOPEAK, VANCORANDOM, GENTTROUGH, GENTPEAK, GENTRANDOM, TOBRATROUGH, TOBRAPEAK, TOBRARND, AMIKACINPEAK, AMIKACINTROU, AMIKACIN in the last 72 hours.   Microbiology: Recent Results (from the past 720 hour(s))  Respiratory Panel by RT PCR (Flu A&B, Covid) - Nasopharyngeal Swab     Status: None   Collection Time: 09/22/19  5:37 AM   Specimen: Nasopharyngeal Swab  Result Value Ref Range Status   SARS Coronavirus 2 by RT PCR NEGATIVE NEGATIVE Final    Comment: (NOTE) SARS-CoV-2 target nucleic acids are NOT DETECTED. The SARS-CoV-2 RNA is generally detectable in upper respiratoy specimens during the acute phase of infection. The lowest concentration of SARS-CoV-2 viral copies this assay can detect is 131 copies/mL. A negative result does not preclude SARS-Cov-2 infection and should not be used as the sole basis for treatment or other patient management decisions. A negative result may occur with  improper specimen collection/handling, submission of specimen other than nasopharyngeal swab, presence of viral mutation(s) within the areas targeted by this assay, and inadequate number of viral copies (<131 copies/mL). A negative result must be combined with clinical observations, patient history, and epidemiological information. The expected result is Negative. Fact Sheet for  Patients:  PinkCheek.be Fact Sheet for Healthcare Providers:  GravelBags.it This test is not yet ap proved or cleared by the [place] FDA and  has been authorized for detection and/or diagnosis of SARS-CoV-2 by FDA under an Emergency Use Authorization (EUA). This EUA will remain  in effect (meaning this test can be used) for the duration of the COVID-19 declaration under Section 564(b)(1) of the Act, 21 U.S.C. section 360bbb-3(b)(1), unless the authorization is terminated or revoked sooner.    Influenza A by PCR NEGATIVE NEGATIVE Final   Influenza B by PCR NEGATIVE NEGATIVE Final    Comment: (NOTE) The Xpert Xpress SARS-CoV-2/FLU/RSV assay is intended as an aid in  the diagnosis of influenza from Nasopharyngeal swab specimens and  should not be used as a sole basis for treatment. Nasal washings and  aspirates are unacceptable for Xpert Xpress SARS-CoV-2/FLU/RSV  testing. Fact Sheet for Patients: PinkCheek.be Fact Sheet for Healthcare Providers: GravelBags.it This test is not yet approved or cleared by the [place] FDA and  has been authorized for detection and/or diagnosis of SARS-CoV-2 by  FDA under an Emergency Use Authorization (EUA). This EUA will remain  in effect (meaning this test can be used) for the duration of the  Covid-19 declaration under Section 564(b)(1) of the Act, 21  U.S.C. section 360bbb-3(b)(1), unless the authorization is  terminated or revoked. Performed at [facility], [address], [place], [place] [address]   Blood culture (routine x 2)     Status: None (Preliminary result)   Collection Time: 09/22/19  6:32 AM   Specimen: BLOOD  Result Value Ref Range Status   Specimen Description BLOOD LEFT HAND  Final   Special Requests   Final  BOTTLES DRAWN AEROBIC AND ANAEROBIC Blood Culture adequate volume Performed at [facility], [address] [place], [place] [address]    Culture PENDING  Incomplete   Report Status PENDING  Incomplete  Blood culture (routine x 2)     Status: None (Preliminary result)   Collection Time: 09/22/19  6:39 AM   Specimen: BLOOD  Result Value Ref Range Status   Specimen Description BLOOD LEFT HAND  Final   Special Requests   Final    BOTTLES DRAWN AEROBIC AND ANAEROBIC Blood Culture adequate volume Performed at [facility], [address] [place], [place] [address]    Culture PENDING  Incomplete   Report Status PENDING  Incomplete    Medical History: Past Medical History:  Diagnosis Date  . Anemia   . Atrial fibrillation ([place])   . Chest pain 05/15/2006   Stress test negative for ischemia  . Diastolic heart failure ([place])   . Diverticulosis   . ESRD on hemodialysis ([place])   . GERD (gastroesophageal reflux disease)   . Headache   . Mixed hyperlipidemia   . Peripheral neuropathy   . PONV (postoperative nausea and vomiting)   . Systemic hypertension   . Uterine cancer ([facility])     Medications:  Anti-infectives (From admission, onward)   Start     Dose/Rate Route Frequency Ordered Stop   09/22/19 0715  vancomycin (VANCOREADY) IVPB 1500 mg/300 mL     1,500 mg 150 mL/hr over 120 Minutes Intravenous  Once 09/22/19 0704     09/22/19 0700  cefTRIAXone (ROCEPHIN) 1 g in sodium chloride 0.9 % 100 mL IVPB     1 g 200 mL/hr over 30 Minutes Intravenous  Once 09/22/19 0645     09/22/19 0700  azithromycin (ZITHROMAX) 500 mg in sodium chloride 0.9 % 250 mL IVPB     500 mg 250 mL/hr over 60 Minutes Intravenous  Once 09/22/19 0645        Assessment: 79 yo with shortness of breath.  PMH significant for atrial fibrillation on coumadin, ESRD on dialysis MWF and HTN.  Pharmacy has been asked to dose vancomycin for pneumonia.  Goal of Therapy:  Vancomycin trough level 15-20 mcg/ml  Plan:  Preliminary review of pertinent patient information completed.  Protocol will be initiated with  dose(s) of vancomycin 1500mg  once.  Forestine Na clinical pharmacist will complete review during morning rounds to assess patient and finalize treatment regimen if needed.  Nyra Capes, [facility] 09/22/2019,7:04 AM

## 2019-09-22 NOTE — ED Notes (Signed)
Date and time results received: 09/22/19 0908 (use smartphrase ".now" to insert current time)  Test: lac acid Critical Value: 2.3  Name of Provider Notified: courage   Orders Received? Or Actions Taken?: see chart

## 2019-09-22 NOTE — Progress Notes (Signed)
ANTICOAGULATION CONSULT NOTE - Initial Consult  Pharmacy Consult for warfarin Indication: atrial fibrillation  No Known Allergies  Patient Measurements: Height: 5\' 4"  (162.6 cm) Weight: 215 lb (97.5 kg) IBW/kg (Calculated) : 54.7   Vital Signs: Temp: 98.2 F (36.8 C) (02/15 0524) Temp Source: Oral (02/15 0524) BP: 122/68 (02/15 0914) Pulse Rate: 116 (02/15 0900)  Labs: Recent Labs    09/22/19 0530  HGB 11.6*  HCT 35.7*  PLT 171  LABPROT 23.6*  INR 2.1*  CREATININE 12.94*    Estimated Creatinine Clearance: 3.9 mL/min (A) (by C-G formula based on SCr of 12.94 mg/dL (H)).   Medical History: Past Medical History:  Diagnosis Date  . Anemia   . Atrial fibrillation ([place])   . Chest pain 05/15/2006   Stress test negative for ischemia  . Diastolic heart failure ([place])   . Diverticulosis   . ESRD on hemodialysis ([place])   . GERD (gastroesophageal reflux disease)   . Headache   . Mixed hyperlipidemia   . Peripheral neuropathy   . PONV (postoperative nausea and vomiting)   . Systemic hypertension   . Uterine cancer ([place])     Medications:  (Not in a hospital admission)   Assessment: Pharmacy consulted to dose warfarin in patient with atrial fibrillation.  Patient's INR on admission is 2.1 with last dose given 2/14 1830.  Home dose listed as 10 mg every Mon and 7.5 mg ROW.  Goal of Therapy:  INR 2-3 Monitor platelets by anticoagulation protocol: Yes   Plan:  Warfarin 10 mg x 1 dose. Monitor daily INR and s/s of bleeding.  Revonda Standard Dymir Neeson 09/22/2019,10:15 AM

## 2019-09-22 NOTE — ED Provider Notes (Signed)
[facility] EMERGENCY DEPARTMENT Provider Note   CSN: [id number] Arrival date & time: 09/22/19  0459     History Chief Complaint  Patient presents with  . Shortness of Breath   Level 5 caveat due to acuity of condition LENAY LOVEJOY is a 79 y.o. female.  The history is provided by the patient.  Shortness of Breath Severity:  Severe Onset quality:  Sudden Timing:  Constant Progression:  Worsening Chronicity:  New Relieved by:  None tried Worsened by:  Nothing Associated symptoms: no chest pain, no cough and no fever    Patient with extensive medical history including atrial fibrillation on Coumadin, ESRD on dialysis M/W/F, hypertension presents with shortness of breath.  She reports waking up this morning feeling severely short of breath and generalized weakness.  Denies any pain, no fevers or cough.  She has not missed any recent dialysis.  She is not on home oxygen.  She is a non-smoker    Past Medical History:  Diagnosis Date  . Anemia   . Atrial fibrillation ([place])   . Chest pain 05/15/2006   Stress test negative for ischemia  . Diastolic heart failure ([place])   . Diverticulosis   . ESRD on hemodialysis ([place])   . GERD (gastroesophageal reflux disease)   . Headache   . Mixed hyperlipidemia   . Peripheral neuropathy   . PONV (postoperative nausea and vomiting)   . Systemic hypertension   . Uterine cancer [facility])     Patient Active Problem List   Diagnosis Date Noted  . Sepsis due to undetermined organism ([place]) 04/21/2019  . Coagulopathy ([place]) 04/21/2019  . Ischemic colitis ([employer])   . CAP (community acquired pneumonia) 04/08/2019  . Permanent atrial fibrillation ([place]) 08/22/2018  . Acute systolic CHF (congestive heart failure) ([place]) 08/22/2018  . Community acquired pneumonia   . HCAP (healthcare-associated pneumonia) 08/18/2018  . Acute respiratory failure with hypoxia ([place]) 08/18/2018  . Pulmonary edema 08/18/2018  . Lobar pneumonia ([place]) 08/18/2018  .  Dependence on renal dialysis ([place]) 10/19/2015  . Pure hypercholesterolemia 10/19/2015  . ESRD (end stage renal disease) ([place]) 05/19/2014  . 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 Abscess re-check 01/19/2014  . Breast abscess of female 01/19/2014  . Chronic diastolic CHF (congestive heart failure) ([place]) 12/25/2013  . Dyspnea 12/21/2013  . CHF (congestive heart failure) ([place]) 12/21/2013  . End stage renal disease ([place]) 11/06/2013  . CKD (chronic kidney disease) stage 5, GFR less than 15 ml/min (H[facility]) 10/21/2013  . HTN (hypertension) 10/21/2013  . Mixed hyperlipidemia 10/21/2013  . Encounter for therapeutic drug monitoring 09/03/2013  . Atrial fibrillation ([place]) 10/11/2012  . Long term current use of anticoagulant therapy 10/11/2012  . Anemia 03/27/2011  . Gastro-esophageal reflux disease without esophagitis 05/27/2010  . Malignant neoplasm of uterus ([place]) 05/27/2010    Past Surgical History:  Procedure Laterality Date  . ABDOMINAL HYSTERECTOMY    . APPENDECTOMY    . AV FISTULA PLACEMENT Left 11/17/2013   Procedure: CREATION OF LEFT RADIOCEPHALIC ARTERIOVENOUS (AV) FISTULA ;  Surgeon: [name], MD;  Location: [place];  Service: Vascular;  Laterality: Left;  . BREAST SURGERY Left    tumors non canerous 2- 3 different surgeries  . CATARACT EXTRACTION W/PHACO Right 10/26/2016   Procedure: CATARACT EXTRACTION PHACO AND INTRAOCULAR LENS PLACEMENT (IOC) CDE= 11.91;  Surgeon: Tonny Branch, MD;  Location: [facility];  Service: Ophthalmology;  Laterality: Right;  right - pt knows to arrive at  6:30  . CATARACT EXTRACTION W/PHACO Left 11/30/2016   Procedure: CATARACT EXTRACTION PHACO AND INTRAOCULAR LENS PLACEMENT (IOC) CDE - 7.92 ;  Surgeon: Tonny Branch, MD;  Location: [facility];  Service: Ophthalmology;  Laterality: Left;  left  . CHOLECYSTECTOMY    . COLONOSCOPY W/ BIOPSIES AND POLYPECTOMY    . FISTULA SUPERFICIALIZATION Left 05/19/2014   Procedure: FISTULA  SUPERFICIALIZATION-LEFT ARM;  Surgeon: [name], MD;  Location: [place];  Service: Vascular;  Laterality: Left;  . INSERTION OF DIALYSIS CATHETER N/A 01/24/2014   Procedure: INSERTION OF DIALYSIS CATHETER;  Surgeon: Rosetta Posner, MD;  Location: [facility] OR;  Service: Vascular;  Laterality: N/A;  . KNEE ARTHROSCOPY    . TUBAL LIGATION       OB History    Gravida  3   Para  2   Term  2   Preterm      AB  1   Living        SAB  1   TAB      Ectopic      Multiple      Live Births              Family History  Problem Relation Age of Onset  . Diabetes Mother   . Hypertension Mother   . Diabetes Father   . Heart disease Father        before age 97  . Heart attack Father   . Diabetes Sister   . Hyperlipidemia Sister   . Hypertension Sister   . Cancer Brother   . Diabetes Brother   . Heart disease Brother   . Hypertension Brother   . Heart attack Brother   . Colon cancer Neg Hx     Social History   Tobacco Use  . Smoking status: Former Smoker    Quit date: 08/08/1999    Years since quitting: 20.1  . Smokeless tobacco: Never Used  Substance Use Topics  . Alcohol use: No    Alcohol/week: 0.0 standard drinks  . Drug use: No    Home Medications Prior to Admission medications   Medication Sig Start Date End Date Taking? Authorizing Provider  acetaminophen (TYLENOL) 500 MG tablet Take 1,000 mg by mouth every 6 (six) hours as needed for moderate pain.     [provider]  albuterol (VENTOLIN HFA) 108 (90 Base) MCG/ACT inhaler Inhale 2 puffs into the lungs every 4 (four) hours as needed for wheezing or shortness of breath (cough, shortness of breath or wheezing.). 04/25/19   Murlean Iba, MD  Cholecalciferol (VITAMIN D) 2000 UNITS CAPS Take 2,000 Units by mouth every evening.     [provider]  fluticasone (FLOVENT HFA) 44 MCG/ACT inhaler Inhale 2 puffs into the lungs 2 (two) times daily. 04/25/19 04/24/20  Johnson, Clanford L, MD    gabapentin (NEURONTIN) 100 MG capsule Take 100 mg by mouth 2 (two) times daily.  09/13/18   [provider]  metoprolol tartrate (LOPRESSOR) 25 MG tablet Take 1 tablet (25 mg total) by mouth 2 (two) times daily. 04/25/19   Johnson, Clanford L, MD  Multiple Vitamins-Minerals (PRESERVISION AREDS 2 PO) Take 1 tablet by mouth 2 (two) times daily.    [provider]  multivitamin (RENA-VIT) TABS tablet Take 1 tablet by mouth every evening.  07/06/18   [provider]  Omega-3 Fatty Acids (OMEGA-3 FISH OIL) 300 MG CAPS Take 300 mg by mouth 2 (two) times daily.  [provider]  omeprazole (PRILOSEC) 20 MG capsule Take 20 mg by mouth every morning.     [provider]  predniSONE (STERAPRED UNI-PAK 21 TAB) 10 MG (21) TBPK tablet Take by mouth daily. Take 6 tabs by mouth daily  for 2 days, then 5 tabs for 2 days, then 4 tabs for 2 days, then 3 tabs for 2 days, 2 tabs for 2 days, then 1 tab by mouth daily for 2 days 07/02/19   Stacey Drain, [place], PA-C  Propylene Glycol (SYSTANE BALANCE) 0.6 % SOLN Place 1 drop into both eyes 3 (three) times daily as needed (for burning or dry eyes).     [provider]  RENVELA 800 MG tablet Take 2,400 mg by mouth 3 (three) times daily with meals. Takes 1 tablet with snacks 04/17/15   [provider]  SSD 1 % cream APPLY A THICK LAYER OF CREAM TO ENTIRE WOUND AREA ONCE A DAY 05/29/19   [provider]  warfarin (COUMADIN) 5 MG tablet TAKE 1 & 1/2 TABLETS BY MOUTH (ONE & ONE-HALF) TO 2 TABLETS ONCE DAILY AS DIRECTED BY COUMADIN CLINIC 08/05/19   Lendon Colonel, NP    Allergies    Patient has no known allergies.  Review of Systems   Review of Systems  Unable to perform ROS: Acuity of condition  Constitutional: Negative for fever.  Respiratory: Positive for shortness of breath. Negative for cough.   Cardiovascular: Negative for chest pain.    Physical Exam Updated Vital Signs BP (!) 145/93    Pulse (!) 114   Temp 98.2 F (36.8 C) (Oral)   Resp (!) 30   Ht 1.626 m (5\' 4" )   Wt 97.5 kg   SpO2 95%   BMI 36.90 kg/m   Physical Exam CONSTITUTIONAL: Elderly, distress noted HEAD: Normocephalic/atraumatic EYES: EOMI ENMT: Mucous membranes moist NECK: supple no meningeal signs SPINE/BACK:entire spine nontender CV: Tachycardic, irregular LUNGS: Tachypnea, coarse breath sounds in the bases, scattered wheezing noted bilaterally ABDOMEN: soft, nontender, no rebound or guarding, bowel sounds noted throughout abdomen GU:no cva tenderness NEURO: Pt is awake/alert/appropriate, moves all extremitiesx4.  No facial droop.   EXTREMITIES: pulses normal/equal, full ROM, mild edema to bilateral lower extremities, dialysis access left arm with thrill noted SKIN: warm, color normal PSYCH: no abnormalities of mood noted, alert and oriented to situation  ED Results / Procedures / Treatments   Labs (all labs ordered are listed, but only abnormal results are displayed) Labs Reviewed  BASIC METABOLIC PANEL - Abnormal; Notable for the following components:      Result Value   Chloride 96 (*)    CO2 21 (*)    Glucose, Bld 165 (*)    BUN 62 (*)    Creatinine, Ser 12.94 (*)    GFR calc non Af Amer 2 (*)    GFR calc Af Amer 3 (*)    Anion gap 20 (*)    All other components within normal limits  CBC WITH DIFFERENTIAL/PLATELET - Abnormal; Notable for the following components:   WBC 11.0 (*)    RBC 3.12 (*)    Hemoglobin 11.6 (*)    HCT 35.7 (*)    MCV 114.4 (*)    MCH 37.2 (*)    Abs Immature Granulocytes 0.14 (*)    All other components within normal limits  PROTIME-INR - Abnormal; Notable for the following components:   Prothrombin Time 23.6 (*)    INR 2.1 (*)    All other  components within normal limits  LACTIC ACID, PLASMA - Abnormal; Notable for the following components:   Lactic Acid, Venous 2.2 (*)    All other components within normal limits  RESPIRATORY PANEL BY RT PCR (FLU  A&B, COVID)  CULTURE, BLOOD (ROUTINE X 2)  CULTURE, BLOOD (ROUTINE X 2)  LACTIC ACID, PLASMA    EKG EKG Interpretation  Date/Time:  Monday September 22 2019 05:25:39 EST Ventricular Rate:  131 PR Interval:    QRS Duration: 141 QT Interval:  347 QTC Calculation: 513 R Axis:   -86 Text Interpretation: Atrial fibrillation Nonspecific IVCD with LAD Left ventricular hypertrophy Anterior infarct, old Baseline wander in lead(s) V1 Confirmed by Ripley Fraise [phone]) on 09/22/2019 5:30:42 AM   Radiology DG Chest Port 1 View  Result Date: 09/22/2019 CLINICAL DATA:  Shortness of breath EXAM: PORTABLE CHEST 1 VIEW COMPARISON:  04/20/2019 FINDINGS: Patchy bilateral airspace opacity most dense in the peripheral left lung. Cardiomegaly. No effusion or pneumothorax. IMPRESSION: 1. Patchy bilateral pneumonia. 2. Chronic cardiomegaly. Electronically Signed   By: Monte Fantasia M.D.   On: 09/22/2019 05:55    Procedures .Critical Care Performed by: Ripley Fraise, MD Authorized by: Ripley Fraise, MD   Critical care provider statement:    Critical care time (minutes):  50   Critical care start time:  09/22/2019 5:55 AM   Critical care end time:  09/22/2019 6:45 AM   Critical care was necessary to treat or prevent imminent or life-threatening deterioration of the following conditions:  Respiratory failure and renal failure   Critical care was time spent personally by me on the following activities:  Ordering and review of radiographic studies, ordering and review of laboratory studies, ordering and performing treatments and interventions, pulse oximetry, re-evaluation of patient's condition, review of old charts, examination of patient, evaluation of patient's response to treatment, interpretation of cardiac output measurements and development of treatment plan with patient or surrogate   I assumed direction of critical care for this patient from another provider in my specialty: no         Medications Ordered in ED Medications  diltiazem (CARDIZEM) 125 mg in dextrose 5% 125 mL (1 mg/mL) infusion (5 mg/hr Intravenous New Bag/Given 09/22/19 0639)  cefTRIAXone (ROCEPHIN) 1 g in sodium chloride 0.9 % 100 mL IVPB (has no administration in time range)  azithromycin (ZITHROMAX) 500 mg in sodium chloride 0.9 % 250 mL IVPB (has no administration in time range)  vancomycin (VANCOREADY) IVPB 1500 mg/300 mL (has no administration in time range)    ED Course  I have reviewed the triage vital signs and the nursing notes.  Pertinent labs & imaging results that were available during my care of the patient were reviewed by me and considered in my medical decision making (see chart for details).    MDM Rules/Calculators/A&P                      5:55 AM Patient is a dialysis patient who presents with abrupt shortness of breath.  She is in mild distress, requiring oxygen.  Strong suspicion for acute pulmonary edema.  Nitroglycerin drip has been ordered due to severe hypertension. Labs and imaging are pending 7:07 AM Patient found to have pneumonia.  COVID-19 negative.  Stop nitroglycerin and restart Cardizem.  Heart rate is improving.  Patient reports feeling improved.  IV antibiotics have been ordered Signed out to Dr. Lacinda Axon with admission pending  ANNA BEAIRD was evaluated in Emergency Department on 09/22/2019  for the symptoms described in the history of present illness. She was evaluated in the context of the global COVID-19 pandemic, which necessitated consideration that the patient might be at risk for infection with the SARS-CoV-2 virus that causes COVID-19. Institutional protocols and algorithms that pertain to the evaluation of patients at risk for COVID-19 are in a state of rapid change based on information released by regulatory bodies including the CDC and federal and state organizations. These policies and algorithms were followed during the patient's care in the ED.  Final  Clinical Impression(s) / ED Diagnoses Final diagnoses:  Atrial fibrillation with rapid ventricular response ([place])  Community acquired pneumonia, unspecified laterality  ESRD (end stage renal disease) ([place])    Rx / DC Orders ED Discharge Orders    None       Ripley Fraise, MD 09/22/19 [phone]

## 2019-09-22 NOTE — ED Notes (Signed)
Pt advised that she left her cane on the wheelchair that she was in, RN went to look for cane and did not find one, RN contacted pt's son [name] who advised that he had pt's cane with him,

## 2019-09-22 NOTE — Procedures (Signed)
    HEMODIALYSIS TREATMENT NOTE:  HD performed in ED.  Afib 110-120 pre-treatment.  Primary RN reports patient had been on diltiazem infusion earlier this morning, but it was stopped at 1020 due to hypotension.  Metoprolol 25mg po was given at 1130 however level is reduced with dialysis.  HR 120-140 about 45 minutes into treatment.  BP stable and pt asymptomatic.  Dilt drip restarted (@ 5mg/h) by primary RN after d/w Dr. Emokpae.  HR remained in 130s.  Dilt rate was increased to 10mg/h but pt became hypotensive (SBPs 70s / asymptomatic) within minutes of rate increase.  Drip rate was subsequently decreased back to 5mg/h.  UF was also suspended and rate remained OFF for the remainder of the treatment.  Dilt drip was discontinued and Amiodarone was started by primary nurse as hemodialysis treatment concluded.  3 hour treatment completed via left forearm AVF (15g/antegrade). Goal NOT met: 2.3 liters removed.  All blood was returned and hemostasis was achieved in 15 minutes.   , RN 

## 2019-09-22 NOTE — Progress Notes (Signed)
Pharmacy Antibiotic Note  Sabrina Beck is a 79 y.o. female admitted on 09/22/2019 with sepsis.  Patient is ESRD on MWF schedule.   Pharmacy has been consulted for Vancomycin dosing.  Plan: Vancomycin 1500 mg IV x 1 dose. Vancomycin 1000 mg IV every MWF after HD. Goal trough 15-20 mcg/mL. Monitor labs ,c/s, and patient improvement.  Height: 5\' 4"  (162.6 cm) Weight: 215 lb (97.5 kg) IBW/kg (Calculated) : 54.7  Temp (24hrs), Avg:98.2 F (36.8 C), Min:98.2 F (36.8 C), Max:98.2 F (36.8 C)  Recent Labs  Lab 09/22/19 0530 09/22/19 0633 09/22/19 0835  WBC 11.0*  --   --   CREATININE 12.94*  --   --   LATICACIDVEN  --  2.2* 2.3*    Estimated Creatinine Clearance: 3.9 mL/min (A) (by C-G formula based on SCr of 12.94 mg/dL (H)).    No Known Allergies  Antimicrobials this admission: Vanco 2/15 >>  CTX 2/15 >>  Azith 2/15 >>  Dose adjustments this admission: Vanco  Microbiology results: 2/15 BCx: ngtd  2/15 MRSA PCR: pending  Thank you for allowing pharmacy to be a part of this patient's care.  Margot Ables, PharmD Clinical Pharmacist 09/22/2019 10:51 AM

## 2019-09-22 NOTE — ED Notes (Signed)
CRITICAL VALUE ALERT  Critical Value: Lactic Acid 2.2 Date & Time Notied:  09/22/19 @ 0703 Provider Notified: Dr Christy Gentles Orders Received/Actions taken: None

## 2019-09-22 NOTE — Progress Notes (Signed)
  Amiodarone Drug - Drug Interaction Consult Note  Recommendations:  Watch INR closely on warfarin and consider dose reduction.  Limit duration of azithromycin due QTc prolongation risk.  Watch for increased risk of bradycardia, AV block and myocardial depression on diltiazem  Amiodarone is metabolized by the cytochrome P450 system and therefore has the potential to cause many drug interactions. Amiodarone has an average plasma half-life of 50 days (range 20 to 100 days).   There is potential for drug interactions to occur several weeks or months after stopping treatment and the onset of drug interactions may be slow after initiating amiodarone.   []  Statins: Increased risk of myopathy. Simvastatin- restrict dose to 20mg  daily. Other statins: counsel patients to report any muscle pain or weakness immediately.  [x]  Anticoagulants: Amiodarone can increase anticoagulant effect. Consider warfarin dose reduction. Patients should be monitored closely and the dose of anticoagulant altered accordingly, remembering that amiodarone levels take several weeks to stabilize.  []  Antiepileptics: Amiodarone can increase plasma concentration of phenytoin, the dose should be reduced. Note that small changes in phenytoin dose can result in large changes in levels. Monitor patient and counsel on signs of toxicity.  []  Beta blockers: increased risk of bradycardia, AV block and myocardial depression. Sotalol - avoid concomitant use.  [x]   Calcium channel blockers (diltiazem and verapamil): increased risk of bradycardia, AV block and myocardial depression.   []   Cyclosporine: Amiodarone increases levels of cyclosporine. Reduced dose of cyclosporine is recommended.  []  Digoxin dose should be halved when amiodarone is started.  []  Diuretics: increased risk of cardiotoxicity if hypokalemia occurs.  []  Oral hypoglycemic agents (glyburide, glipizide, glimepiride): increased risk of hypoglycemia. Patient's glucose  levels should be monitored closely when initiating amiodarone therapy.   [x]  Drugs that prolong the QT interval:  Torsades de pointes risk may be increased with concurrent use - avoid if possible.  Monitor QTc, also keep magnesium/potassium WNL if concurrent therapy can't be avoided. Marland Kitchen Antibiotics: e.g. fluoroquinolones, erythromycin. . Antiarrhythmics: e.g. quinidine, procainamide, disopyramide, sotalol. . Antipsychotics: e.g. phenothiazines, haloperidol.  . Lithium, tricyclic antidepressants, and methadone. Thank Dennis Bast  Elicia Lamp [facility]  09/22/2019 6:49 PM

## 2019-09-22 NOTE — Consult Note (Signed)
[facility] Renal Consultation Note  Requesting MD: Roxan Hockey, MD Indication for Consultation: End-stage renal disease  Chief complaint: Shortness of breath  HPI:  Sabrina Beck is a 79 y.o. female with a history of end-stage renal disease, chronic diastolic CHF, anemia, and atrial fibrillation who presented to the hospital with shortness of breath.  She was found to have patchy bilateral pneumonia on chest x-ray.  Course here has also been complicated by A fib with RVR and she was ordered a diltiazem infusion.  She has been wheezing in the ER and has required supplemental oxygen at 3.5 - 4 L most recently.  She states that she noticed her shortness of breath when she woke up today.  I have discussed with the dialysis nurse to let her know the patient is here and to anticipate orders for dialysis soon.  She normally dialyzes Monday Wednesday Friday at [facility] [address].  States that she did go to dialysis on Friday and stayed her full treatment and normally does not have issues with tolerating dialysis.   She denies any nausea or vomiting or dizziness.  Denies any subjective fever or cough.  Just had a breathing treatment - still short of breath.  Thinks that she may carry fluid around her abdomen and lungs.   Attempted to call her HD unit; they did not answer my call for her orders.    PMHx:   Past Medical History:  Diagnosis Date  . Anemia   . Atrial fibrillation ([place])   . Chest pain 05/15/2006   Stress test negative for ischemia  . Diastolic heart failure ([place])   . Diverticulosis   . ESRD on hemodialysis ([place])   . GERD (gastroesophageal reflux disease)   . Headache   . Mixed hyperlipidemia   . Peripheral neuropathy   . PONV (postoperative nausea and vomiting)   . Systemic hypertension   . Uterine cancer [facility])     Past Surgical History:  Procedure Laterality Date  . ABDOMINAL HYSTERECTOMY    . APPENDECTOMY    . AV FISTULA PLACEMENT Left  11/17/2013   Procedure: CREATION OF LEFT RADIOCEPHALIC ARTERIOVENOUS (AV) FISTULA ;  Surgeon: [name], MD;  Location: [facility];  Service: Vascular;  Laterality: Left;  . BREAST SURGERY Left    tumors non canerous 2- 3 different surgeries  . CATARACT EXTRACTION W/PHACO Right 10/26/2016   Procedure: CATARACT EXTRACTION PHACO AND INTRAOCULAR LENS PLACEMENT (IOC) CDE= 11.91;  Surgeon: Tonny Branch, MD;  Location: [facility];  Service: Ophthalmology;  Laterality: Right;  right - pt knows to arrive at 6:30  . CATARACT EXTRACTION W/PHACO Left 11/30/2016   Procedure: CATARACT EXTRACTION PHACO AND INTRAOCULAR LENS PLACEMENT (IOC) CDE - 7.92 ;  Surgeon: Tonny Branch, MD;  Location: [facility];  Service: Ophthalmology;  Laterality: Left;  left  . CHOLECYSTECTOMY    . COLONOSCOPY W/ BIOPSIES AND POLYPECTOMY    . FISTULA SUPERFICIALIZATION Left 05/19/2014   Procedure: FISTULA SUPERFICIALIZATION-LEFT ARM;  Surgeon: [name], MD;  Location: [facility];  Service: Vascular;  Laterality: Left;  . INSERTION OF DIALYSIS CATHETER N/A 01/24/2014   Procedure: INSERTION OF DIALYSIS CATHETER;  Surgeon: Rosetta Posner, MD;  Location: [facility] OR;  Service: Vascular;  Laterality: N/A;  . KNEE ARTHROSCOPY    . TUBAL LIGATION      Family Hx:  Family History  Problem Relation Age of Onset  . Diabetes Mother   . Hypertension Mother   . Diabetes Father   .  Heart disease Father        before age 26  . Heart attack Father   . Diabetes Sister   . Hyperlipidemia Sister   . Hypertension Sister   . Cancer Brother   . Diabetes Brother   . Heart disease Brother   . Hypertension Brother   . Heart attack Brother   . Colon cancer Neg Hx     Social History:  reports that she quit smoking about 20 years ago. She has never used smokeless tobacco. She reports that she does not drink alcohol or use drugs.  Allergies: No Known Allergies  Medications: Prior to Admission medications   Medication Sig Start Date End Date Taking?  Authorizing Provider  acetaminophen (TYLENOL) 500 MG tablet Take 1,000 mg by mouth every 6 (six) hours as needed for moderate pain.    Yes [provider]  albuterol (VENTOLIN HFA) 108 (90 Base) MCG/ACT inhaler Inhale 2 puffs into the lungs every 4 (four) hours as needed for wheezing or shortness of breath (cough, shortness of breath or wheezing.). 04/25/19  Yes Johnson, Clanford L, MD  Cholecalciferol (VITAMIN D) 2000 UNITS CAPS Take 2,000 Units by mouth every evening.    Yes [provider]  fluticasone (FLOVENT HFA) 44 MCG/ACT inhaler Inhale 2 puffs into the lungs 2 (two) times daily. 04/25/19 04/24/20 Yes Johnson, Clanford L, MD  gabapentin (NEURONTIN) 100 MG capsule Take 100 mg by mouth 2 (two) times daily.  09/13/18  Yes [provider]  metoprolol tartrate (LOPRESSOR) 25 MG tablet Take 1 tablet (25 mg total) by mouth 2 (two) times daily. 04/25/19  Yes Johnson, Clanford L, MD  Multiple Vitamins-Minerals (PRESERVISION AREDS 2 PO) Take 1 tablet by mouth 2 (two) times daily.   Yes [provider]  multivitamin (RENA-VIT) TABS tablet Take 1 tablet by mouth every evening.  07/06/18  Yes [provider]  Omega-3 Fatty Acids (OMEGA-3 FISH OIL) 300 MG CAPS Take 300 mg by mouth 2 (two) times daily.   Yes [provider]  omeprazole (PRILOSEC) 20 MG capsule Take 20 mg by mouth every morning.    Yes [provider]  Propylene Glycol (SYSTANE BALANCE) 0.6 % SOLN Place 1 drop into both eyes 3 (three) times daily as needed (for burning or dry eyes).    Yes [provider]  RENVELA 800 MG tablet Take 2,400 mg by mouth 3 (three) times daily with meals. Takes 1 tablet with snacks 04/17/15  Yes [provider]  warfarin (COUMADIN) 5 MG tablet TAKE 1 & 1/2 TABLETS BY MOUTH (ONE & ONE-HALF) TO 2 TABLETS ONCE DAILY AS DIRECTED BY COUMADIN CLINIC Patient taking differently: Take 5-7.5 mg by mouth daily at 6 PM. TAKE 1 TABLET BY MOUTH ON Monday  AND 1 1/2 TABLETS BY MOUTH (ONE & ONE-HALF) ON Tuesday, Wednesday, Thursday,  Friday, Saturday, Sunday. 08/05/19  Yes Lendon Colonel, NP  predniSONE (STERAPRED UNI-PAK 21 TAB) 10 MG (21) TBPK tablet Take by mouth daily. Take 6 tabs by mouth daily  for 2 days, then 5 tabs for 2 days, then 4 tabs for 2 days, then 3 tabs for 2 days, 2 tabs for 2 days, then 1 tab by mouth daily for 2 days Patient not taking: Reported on 09/22/2019 07/02/19   Lestine Box, PA-C    I have reviewed the patient's reported prior to admission and current medications.  Labs:  BMP Latest Ref Rng & Units 09/22/2019 07/04/2019 04/25/2019  Glucose 70 - 99 mg/dL  165(H) 188(H) 107(H)  BUN 8 - 23 mg/dL 62(H) 51(H) 30(H)  Creatinine 0.44 - 1.00 mg/dL 12.94(H) 10.74(H) 7.12(H)  BUN/Creat Ratio 11 - 26 - - -  Sodium 135 - 145 mmol/L 137 137 141  Potassium 3.5 - 5.1 mmol/L 4.7 4.8 3.3(L)  Chloride 98 - 111 mmol/L 96(L) 99 100  CO2 22 - 32 mmol/L 21(L) 20(L) 27  Calcium 8.9 - 10.3 mg/dL 10.0 10.3 10.0     ROS:  Pertinent items noted in HPI and remainder of comprehensive ROS otherwise negative.   Physical Exam: Vitals:   09/22/19 1019 09/22/19 1030  BP:  107/83  Pulse:    Resp: (!) 24 (!) 27  Temp:    SpO2: 96% 95%     General: Elderly female in bed with some respiratory distress with exertion HEENT: Normocephalic atraumatic Eyes: Extraocular movements intact sclera anicteric Neck: Supple trachea midline Heart: S1-S2 tachy to 107 on my exam no rub Lungs: Reduced at the bases and with wheezing increased work of breathing with exertion Abdomen: Soft nontender distended/obese habitus Extremities: No pitting edema appreciated Skin: No rash on extremities exposed no cyanosis or clubbing Neuro: Alert and oriented x3 conversant and follows commands Access left upper extremity AV fistula with bruit and thrill  Assessment/Plan:  # End-stage renal disease on hemodialysis - Continue HD on a Monday Wednesday Friday  schedule while inpatient  # Acute hypoxic respiratory failure -Treatment for pneumonia per primary team - On Supplemental oxygen - Hemodialysis today to optimize respiratory status  # Bilateral pneumonia - healthcare associated - Therapies per primary team -see that she has received azithromycin ceftriaxone and is ordered Vanc  # Chronic diastolic CHF - HD to optimize respiratory status  # A. fib with RVR - per primary team - She is ordered for diltiazem GTT - On metoprolol p.o.  # Anemia CKD - No acute indication for ESA   # Secondary hyperpara/bone mineral metabolism - would continue renvela - renal diet   Claudia Desanctis 09/22/2019,11:36 AM

## 2019-09-22 NOTE — ED Triage Notes (Signed)
Pt c /o sob that started this am while pt was getting ready for dialysis, denies any pain, denies any fever, cough,

## 2019-09-22 NOTE — ED Notes (Signed)
Pt's son [name] can be contacted at [phone].

## 2019-09-22 NOTE — TOC Initial Note (Signed)
Transition of Care [facility]) - Initial/Assessment Note    Patient Details  Name: Sabrina Beck MRN: [id number] Date of Birth: Jul 04, 1941  Transition [facility]) CM/SW Contact:    Elyjah Hazan Dimitri Ped, LCSW Phone Number: 09/22/2019, 3:42 PM  Clinical Narrative:     CSW completed TOC assessment with the assistance of patients son Paulina Muchmore.   Pt  is an 79 y.o. female, admitted into observation on 10/02/2019 with a Hx of end-stage renal disease, chronic diastolic CHF, anemia, and atrial fibrillation who presented to the hospital with shortness of breath.  Prior to being seen in the ED for shortness of breath, patient resided at home with her son Saachi Zale. [name] reports that prior to today, patient was functioning "pretty independently". [name] goes into detail and explains that patient uses a cane to assist with ambulation. Patient is currently active with advanced home care and has a nurse come by 1x weekly to assess for vital, weight, and ect. [name] adds that patients last [place] visit is next week.  Patient receives dialysis Monday Wednesday Friday at [facility] in [place].   TOC team will continue to follow patient for discharge related needs.   [place] [facility] Transitions of Care  Clinical Social Worker  Ph: [phone]          Expected Discharge Plan: [place] [facility] Barriers to Discharge: Continued Medical Work up   Patient Goals and CMS Choice Patient states their goals for this hospitalization and ongoing recovery are:: family states that the goal is for her to get well and return home      Expected Discharge Plan and Services Expected Discharge Plan: [facility] In-house Referral: Clinical Social Work     Living arrangements for the past 2 months: [place] Agency: [place] ([facility])        Prior Living Arrangements/Services Living arrangements for the past 2 months: Single  Family Home Lives with:: Adult Children Patient language and need for interpreter reviewed:: Yes Do you feel safe going back to the place where you live?: Yes      Need for Family Participation in Patient Care: No (Comment) Care giver support system in place?: Yes (comment) Current home services: DME, Home RN Criminal Activity/Legal Involvement Pertinent to Current Situation/Hospitalization: No - Comment as needed  Activities of Daily Living Home Assistive Devices/Equipment: Eyeglasses, Cane (specify quad or straight), Walker (specify type) ADL Screening (condition at time of admission) Patient's cognitive ability adequate to safely complete daily activities?: Yes Is the patient deaf or have difficulty hearing?: No Does the patient have difficulty seeing, even when wearing glasses/contacts?: No Does the patient have difficulty concentrating, remembering, or making decisions?: No Patient able to express need for assistance with ADLs?: Yes Does the patient have difficulty dressing or bathing?: No Independently performs ADLs?: Yes (appropriate for developmental age) Does the patient have difficulty walking or climbing stairs?: No Weakness of Legs: None Weakness of Arms/Hands: None  Permission Sought/Granted Permission sought to share information with : Case Manager, Family Supports Permission granted to share information with : Yes, Release of Information Signed  Share Information with NAME: Vola Beneke  Permission granted to share info w AGENCY: [facility]  Permission granted to share info w Relationship: Son  Permission granted to share info w Contact  Information: PH:[phone]  Emotional Assessment Appearance:: Appears stated age Attitude/Demeanor/Rapport: Unable to Assess Affect (typically observed): Unable to Assess Orientation: : Oriented to Self, Oriented to Place, Oriented to  Time, Oriented to Situation Alcohol / Substance Use: Not Applicable Psych Involvement: No  (comment)  Admission diagnosis:  PNA (pneumonia) [J18.9] Patient Active Problem List   Diagnosis Date Noted  . PNA (pneumonia) 09/22/2019  . Sepsis due to undetermined organism ([place]) 04/21/2019  . Coagulopathy ([place]) 04/21/2019  . Ischemic colitis ([place])   . CAP (community acquired pneumonia) 04/08/2019  . Permanent atrial fibrillation ([place]) 08/22/2018  . Acute systolic CHF (congestive heart failure) ([place]) 08/22/2018  . Community acquired pneumonia   . HCAP (healthcare-associated pneumonia) 08/18/2018  . Acute respiratory failure with hypoxia ([place]) 08/18/2018  . Pulmonary edema 08/18/2018  . Lobar pneumonia ([place]) 08/18/2018  . Dependence on renal dialysis ([place]) 10/19/2015  . Pure hypercholesterolemia 10/19/2015  . ESRD (end stage renal disease) ([place]) 05/19/2014  . 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 Abscess re-check 01/19/2014  . Breast abscess of female 01/19/2014  . Chronic diastolic CHF (congestive heart failure) ([place]) 12/25/2013  . Dyspnea 12/21/2013  . CHF (congestive heart failure) ([place]) 12/21/2013  . End stage renal disease ([place]) 11/06/2013  . CKD (chronic kidney disease) stage 5, GFR less than 15 ml/min (HCC) 10/21/2013  . HTN (hypertension) 10/21/2013  . Mixed hyperlipidemia 10/21/2013  . Encounter for therapeutic drug monitoring 09/03/2013  . Atrial fibrillation ([place]) 10/11/2012  . Long term current use of anticoagulant therapy 10/11/2012  . Anemia 03/27/2011  . Gastro-esophageal reflux disease without esophagitis 05/27/2010  . Malignant neoplasm of uterus ([place]) 05/27/2010   PCP:  Redmond School, MD Pharmacy:   [facility] [address] [place] [address] [place] [address] [place] [address] Phone: [phone] Fax: [phone]     Social Determinants of Health (SDOH) Interventions    Readmission Risk Interventions Readmission Risk Prevention Plan 04/25/2019 04/15/2019 04/11/2019  Transportation  Screening Complete Complete -  PCP or Specialist Appt within 3-5 Days - Complete Complete  HRI or Home Care Consult - Not Complete -  Social Work Consult for [place] Planning/Counseling - Complete -  Palliative Care Screening - Not Complete -  Medication Review Press photographer) Complete Complete -  PCP or Specialist appointment within 3-5 days of discharge Complete - -  [place] or Home Care Consult Complete - -  SW Recovery Care/Counseling Consult Complete - -  Palliative Care Screening Not Applicable - -  [place] Not Applicable - -  Some recent data might be hidden

## 2019-09-23 DIAGNOSIS — Z992 Dependence on renal dialysis: Secondary | ICD-10-CM

## 2019-09-23 DIAGNOSIS — N186 End stage renal disease: Secondary | ICD-10-CM

## 2019-09-23 DIAGNOSIS — I5042 Chronic combined systolic (congestive) and diastolic (congestive) heart failure: Secondary | ICD-10-CM

## 2019-09-23 DIAGNOSIS — I4891 Unspecified atrial fibrillation: Secondary | ICD-10-CM

## 2019-09-23 DIAGNOSIS — I952 Hypotension due to drugs: Secondary | ICD-10-CM

## 2019-09-23 DIAGNOSIS — J189 Pneumonia, unspecified organism: Secondary | ICD-10-CM

## 2019-09-23 LAB — COMPREHENSIVE METABOLIC PANEL
ALT: 21 U/L (ref 0–44)
AST: 21 U/L (ref 15–41)
Albumin: 3.1 g/dL — ABNORMAL LOW (ref 3.5–5.0)
Alkaline Phosphatase: 63 U/L (ref 38–126)
Anion gap: 20 — ABNORMAL HIGH (ref 5–15)
BUN: 40 mg/dL — ABNORMAL HIGH (ref 8–23)
CO2: 25 mmol/L (ref 22–32)
Calcium: 9.6 mg/dL (ref 8.9–10.3)
Chloride: 91 mmol/L — ABNORMAL LOW (ref 98–111)
Creatinine, Ser: 8.79 mg/dL — ABNORMAL HIGH (ref 0.44–1.00)
GFR calc Af Amer: 4 mL/min — ABNORMAL LOW (ref >60)
GFR calc non Af Amer: 4 mL/min — ABNORMAL LOW (ref >60)
Glucose, Bld: 134 mg/dL — ABNORMAL HIGH (ref 70–99)
Potassium: 4.5 mmol/L (ref 3.5–5.1)
Sodium: 136 mmol/L (ref 135–145)
Total Bilirubin: 1 mg/dL (ref 0.3–1.2)
Total Protein: 6.4 g/dL — ABNORMAL LOW (ref 6.5–8.1)

## 2019-09-23 LAB — CBC
HCT: 32.9 % — ABNORMAL LOW (ref 36.0–46.0)
Hemoglobin: 10.7 g/dL — ABNORMAL LOW (ref 12.0–15.0)
MCH: 36.9 pg — ABNORMAL HIGH (ref 26.0–34.0)
MCHC: 32.5 g/dL (ref 30.0–36.0)
MCV: 113.4 fL — ABNORMAL HIGH (ref 80.0–100.0)
Platelets: 155 10*3/uL (ref 150–400)
RBC: 2.9 MIL/uL — ABNORMAL LOW (ref 3.87–5.11)
RDW: 14.8 % (ref 11.5–15.5)
WBC: 9.9 10*3/uL (ref 4.0–10.5)
nRBC: 0 % (ref 0.0–0.2)

## 2019-09-23 LAB — MRSA PCR SCREENING: MRSA by PCR: NEGATIVE

## 2019-09-23 LAB — PROTIME-INR
INR: 2.2 — ABNORMAL HIGH (ref 0.8–1.2)
Prothrombin Time: 24.1 seconds — ABNORMAL HIGH (ref 11.4–15.2)

## 2019-09-23 LAB — MAGNESIUM: Magnesium: 1.9 mg/dL (ref 1.7–2.4)

## 2019-09-23 MED ORDER — VANCOMYCIN HCL IN DEXTROSE 1-5 GM/200ML-% IV SOLN
1000.0000 mg | Freq: Once | INTRAVENOUS | Status: AC
  Administered 2019-09-23: 1000 mg via INTRAVENOUS
  Filled 2019-09-23: qty 200

## 2019-09-23 MED ORDER — CHLORHEXIDINE GLUCONATE CLOTH 2 % EX PADS
6.0000 | MEDICATED_PAD | Freq: Every day | CUTANEOUS | Status: DC
  Administered 2019-09-23 – 2019-09-29 (×7): 6 via TOPICAL

## 2019-09-23 MED ORDER — METOPROLOL TARTRATE 5 MG/5ML IV SOLN
2.5000 mg | Freq: Four times a day (QID) | INTRAVENOUS | Status: DC
  Administered 2019-09-23 – 2019-09-24 (×4): 2.5 mg via INTRAVENOUS
  Filled 2019-09-23 (×4): qty 5

## 2019-09-23 MED ORDER — LEVALBUTEROL HCL 0.63 MG/3ML IN NEBU
0.6300 mg | INHALATION_SOLUTION | Freq: Three times a day (TID) | RESPIRATORY_TRACT | Status: DC
  Administered 2019-09-23 – 2019-09-30 (×21): 0.63 mg via RESPIRATORY_TRACT
  Filled 2019-09-23 (×20): qty 3

## 2019-09-23 MED ORDER — WARFARIN SODIUM 7.5 MG PO TABS
7.5000 mg | ORAL_TABLET | Freq: Once | ORAL | Status: AC
  Administered 2019-09-23: 7.5 mg via ORAL
  Filled 2019-09-23: qty 1

## 2019-09-23 NOTE — Progress Notes (Addendum)
[facility] Progress Note  Name: Sabrina Beck MRN: [id number] DOB: 03/26/1941  Chief Complaint:  Shortness of breath   Subjective:  Pt in ICU.  Patient underwent hemodialysis in ER with 2.3 liters UF with HD.  UF was limited by hypotension associated with A. fib with RVR (up to 120-140 about 45 minutes into treatment).  Dilt drip was restarted (had been stopped earlier in the day for hypotension per charting.  Attempted to call HD unit again today and no answer.  She states EDW is 100 kg but has been below that here so suspect not really her EDW.  She declines additional HD treatment today and does not feel swollen.  She was transitioned to [facility]  Review of systems:  Shortness of breath is better No n/v Doesn't feel swollen  No cp Eating breakfast  ------ Background on consult:  Sabrina Beck is a 79 y.o. female with a history of end-stage renal disease, chronic diastolic CHF, anemia, and atrial fibrillation who presented to the hospital with shortness of breath.  She was found to have patchy bilateral pneumonia on chest x-ray.  Course here has also been complicated by A fib with RVR and she was ordered a diltiazem infusion.  She has been wheezing in the ER and has required supplemental oxygen at 3.5 - 4 L most recently.  She states that she noticed her shortness of breath when she woke up today.  I have discussed with the dialysis nurse to let her know the patient is here and to anticipate orders for dialysis soon.  She normally dialyzes Monday Wednesday Friday at [facility] [address]  States that she did go to dialysis on Friday and stayed her full treatment and normally does not have issues with tolerating dialysis.   She denies any nausea or vomiting or dizziness.  Denies any subjective fever or cough.  Just had a breathing treatment - still short of breath.  Thinks that she may carry fluid around her abdomen and lungs.   Attempted to call her HD unit; they did  not answer my call for her orders.    Intake/Output Summary (Last 24 hours) at 09/23/2019 0827 Last data filed at 09/23/2019 0745 Gross per 24 hour  Intake 682.04 ml  Output --  Net 682.04 ml    Vitals:  Vitals:   09/23/19 0400 09/23/19 0500 09/23/19 0700 09/23/19 0747  BP: 101/84  110/62   Pulse: (!) 113 (!) 116 (!) 115   Resp: (!) 34 (!) 25 (!) 23   Temp: 98.8 F (37.1 C)     TempSrc: Oral     SpO2: 93% 98% 95% 93%  Weight:  99.3 kg    Height:         Physical Exam:  General adult female in bed in no acute distress at rest HEENT normocephalic atraumatic extraocular movements intact sclera anicteric Neck supple trachea midline Lungs occ coarse breath sounds; normal work of breathing at rest; on 3 liters oxygen Heart S1S2 tachy to 120-130 on exam.  BP 115/68 on my exam with RN - cuff moved to forearm Abdomen soft nontender obese habitus Extremities no lower extremity edema appreciated Psych normal mood and affect Neuro alert and oriented x 3 Access LUE AVF bruit and thrill   Medications reviewed    Labs:  BMP Latest Ref Rng & Units 09/23/2019 09/22/2019 07/04/2019  Glucose 70 - 99 mg/dL 134(H) 165(H) 188(H)  BUN 8 - 23 mg/dL 40(H) 62(H) 51(H)  Creatinine 0.44 - 1.00 mg/dL 8.79(H) 12.94(H) 10.74(H)  BUN/Creat Ratio 11 - 26 - - -  Sodium 135 - 145 mmol/L 136 137 137  Potassium 3.5 - 5.1 mmol/L 4.5 4.7 4.8  Chloride 98 - 111 mmol/L 91(L) 96(L) 99  CO2 22 - 32 mmol/L 25 21(L) 20(L)  Calcium 8.9 - 10.3 mg/dL 9.6 10.0 10.3     Assessment/Plan:   # End-stage renal disease on hemodialysis - Continue HD on a Monday Wednesday Friday schedule while inpatient; monitoring for additional treatments as needed   # Acute hypoxic respiratory failure -Treatment for pneumonia per primary team - On Supplemental oxygen - manage volume with HD   # Bilateral pneumonia - healthcare associated - Therapies per primary team -  On azithromycin ceftriaxone and Vanc  # Chronic  diastolic CHF - optimize volume status with HD   # A. fib with RVR - control per primary team - On metoprolol p.o.  # Anemia CKD - No acute indication for ESA   # Secondary hyperpara/bone mineral metabolism - outpatient orders not available  - would continue renvela; check phos in AM - renal diet  Claudia Desanctis, MD 09/23/2019 8:51 AM

## 2019-09-23 NOTE — Progress Notes (Signed)
Patient Demographics:    Sabrina Beck, is a 79 y.o. female, DOB - 02/23/1941, WPY:[id number]  Admit date - 09/22/2019   Admitting Physician Jacqualine Weichel [place] Brick, MD  Outpatient Primary MD for the patient is Redmond School, MD  LOS - 1   Chief Complaint  Patient presents with  . Shortness of Breath        Subjective:    Sabrina Beck today has no fevers, no emesis,  No chest pain,   -Resting comfortably  Assessment  & Plan :    Principal Problem:   PNA (pneumonia) Active Problems:   Atrial fibrillation (HCC)   Long term current use of anticoagulant therapy   ESRD (end stage renal disease) (HCC)   Anemia   1)Bilateral community-acquired pneumonia with hypoxic respiratory failure--- hypoxia is not worse, continue IV Rocephin and azithromycin , bronchodilators and mucolytics as ordered  -Supplemental oxygen as ordered  2) chronic Afib --- with RVR, last known EF 45%, patient was treated with IV Cardizem became hypotensive Cardizem was discontinued, tachycardia reoccurred, IV Cardizem was tried again patient became hypotensive again -Continue IV amiodarone -Cardiology consult appreciated, okay to use IV metoprolol -TSH is 4.3,  -Coumadin for anticoagulation,   3) sepsis secondary to pneumonia in a hemodialysis patient--- unable to give required IV fluid boluses due to hemodialysis/ESRD status -Sepsis pathophysiology appears to be resolving -Treat as above #1  4)ESRD--nephrology consult for HD appreciated patient usually gets HD Monday Wednesdays and Fridays, she has not missed any HD sessions -underwent hemodialysis in ER with 2.3 liters UF with HD on 09/22/2019 limited by hypotension -Next HD planned for 09/24/2019  5)-anemia of CKD--- hemoglobin is stable , EPO/ESA agent per nephrology team  6) acute hypoxic respiratory failure secondary to #1 #2 above--- currently requiring oxygen,  hopefully hypoxia will improve with hemodialysis and treatment of pneumonia as above #1  Disposition/Need for in-Hospital Stay- patient unable to be discharged at this time due to --sepsis secondary to pneumonia requiring IV antibiotics, A. fib with RVR requiring IV amiodarone for rate control -Not medically ready for discharge home at this time  Code Status : full  Family Communication:   NA (patient is alert, awake and coherent)  Consults  : Cardiology/nephrology  DVT Prophylaxis  : Coumadin- SCDs   Lab Results  Component Value Date   PLT 155 09/23/2019    Inpatient Medications  Scheduled Meds: . budesonide  0.5 mg Inhalation BID  . Chlorhexidine Gluconate Cloth  6 each Topical Q0600  . Chlorhexidine Gluconate Cloth  6 each Topical Q0600  . cholecalciferol  2,000 Units Oral QPM  . gabapentin  100 mg Oral BID  . guaiFENesin  600 mg Oral BID  . levalbuterol  0.63 mg Nebulization TID  . metoprolol tartrate  2.5 mg Intravenous Q6H  . multivitamin  1 tablet Oral QPM  . multivitamin-lutein  1 capsule Oral BID  . omega-3 acid ethyl esters  1,000 mg Oral BID  . pantoprazole  40 mg Oral Daily  . sevelamer carbonate  2,400 mg Oral TID WC  . sodium chloride flush  3 mL Intravenous Q12H  . Warfarin - Pharmacist Dosing Inpatient   Does not apply q1800   Continuous Infusions: . sodium chloride 250 mL (09/23/19 0820)  .  sodium chloride    . sodium chloride    . amiodarone 30 mg/hr (09/23/19 0848)  . azithromycin 500 mg (09/23/19 1610)  . cefTRIAXone (ROCEPHIN)  IV 1 g (09/23/19 1455)  . diltiazem (CARDIZEM) infusion Stopped (09/22/19 1915)   PRN Meds:.sodium chloride, sodium chloride, sodium chloride, acetaminophen **OR** acetaminophen, guaiFENesin-dextromethorphan, lidocaine (PF), lidocaine-prilocaine, ondansetron (ZOFRAN) IV, ondansetron **OR** ondansetron (ZOFRAN) IV, pentafluoroprop-tetrafluoroeth, polyethylene glycol, polyvinyl alcohol, sodium chloride flush,  traZODone    Anti-infectives (From admission, onward)   Start     Dose/Rate Route Frequency Ordered Stop   09/23/19 1100  vancomycin (VANCOCIN) IVPB 1000 mg/200 mL premix     1,000 mg 200 mL/hr over 60 Minutes Intravenous  Once 09/23/19 1049 09/23/19 1233   09/23/19 1000  azithromycin (ZITHROMAX) 500 mg in sodium chloride 0.9 % 250 mL IVPB     500 mg 250 mL/hr over 60 Minutes Intravenous Every 24 hours 09/22/19 1018     09/23/19 1000  cefTRIAXone (ROCEPHIN) 1 g in sodium chloride 0.9 % 100 mL IVPB     1 g 200 mL/hr over 30 Minutes Intravenous Every 24 hours 09/22/19 1018     09/22/19 1800  vancomycin (VANCOCIN) IVPB 1000 mg/200 mL premix  Status:  Discontinued     1,000 mg 200 mL/hr over 60 Minutes Intravenous Every M-W-F (Hemodialysis) 09/22/19 1300 09/23/19 1049   09/22/19 1030  cefTRIAXone (ROCEPHIN) 1 g in sodium chloride 0.9 % 100 mL IVPB  Status:  Discontinued     1 g 200 mL/hr over 30 Minutes Intravenous Every 24 hours 09/22/19 1015 09/22/19 1018   09/22/19 1030  azithromycin (ZITHROMAX) 500 mg in sodium chloride 0.9 % 250 mL IVPB  Status:  Discontinued     500 mg 250 mL/hr over 60 Minutes Intravenous Every 24 hours 09/22/19 1015 09/22/19 1018   09/22/19 0715  vancomycin (VANCOREADY) IVPB 1500 mg/300 mL     1,500 mg 150 mL/hr over 120 Minutes Intravenous  Once 09/22/19 0704 09/22/19 1600   09/22/19 0700  cefTRIAXone (ROCEPHIN) 1 g in sodium chloride 0.9 % 100 mL IVPB     1 g 200 mL/hr over 30 Minutes Intravenous  Once 09/22/19 0645 09/22/19 0904   09/22/19 0700  azithromycin (ZITHROMAX) 500 mg in sodium chloride 0.9 % 250 mL IVPB     500 mg 250 mL/hr over 60 Minutes Intravenous  Once 09/22/19 0645 09/22/19 1017        Objective:   Vitals:   09/23/19 1400 09/23/19 1600 09/23/19 1700 09/23/19 1800  BP:   108/72 117/83  Pulse: (!) 120 (!) 125 (!) 30 (!) 153  Resp: 19 (!) 24 (!) 24 (!) 22  Temp:  98.1 F (36.7 C)    TempSrc:  Oral    SpO2: 93% 93% 92% 92%  Weight:       Height:        Wt Readings from Last 3 Encounters:  09/23/19 99.3 kg  06/05/19 99.8 kg  05/06/19 98.9 kg     Intake/Output Summary (Last 24 hours) at 09/23/2019 1859 Last data filed at 09/23/2019 1600 Gross per 24 hour  Intake 984.38 ml  Output --  Net 984.38 ml     Physical Exam  Gen:- Awake Alert, in no acute distress HEENT:- [place].AT, No sclera icterus Nose- [place] 2L/min Neck-Supple Neck,No JVD,.  Lungs-diminished in bases, no wheezing CV- S1, S2 normal, irregularly irregular heart rate in the 120s abd-  +ve B.Sounds, Abd Soft, No tenderness,    Extremity/Skin:-, pedal  pulses present  Psych-affect is appropriate, oriented x3 Neuro-generalized weakness, no new focal deficits, no tremors   Data Review:   Micro Results Recent Results (from the past 240 hour(s))  Respiratory Panel by RT PCR (Flu A&B, Covid) - Nasopharyngeal Swab     Status: None   Collection Time: 09/22/19  5:37 AM   Specimen: Nasopharyngeal Swab  Result Value Ref Range Status   SARS Coronavirus 2 by RT PCR NEGATIVE NEGATIVE Final    Comment: (NOTE) SARS-CoV-2 target nucleic acids are NOT DETECTED. The SARS-CoV-2 RNA is generally detectable in upper respiratoy specimens during the acute phase of infection. The lowest concentration of SARS-CoV-2 viral copies this assay can detect is 131 copies/mL. A negative result does not preclude SARS-Cov-2 infection and should not be used as the sole basis for treatment or other patient management decisions. A negative result may occur with  improper specimen collection/handling, submission of specimen other than nasopharyngeal swab, presence of viral mutation(s) within the areas targeted by this assay, and inadequate number of viral copies (<131 copies/mL). A negative result must be combined with clinical observations, patient history, and epidemiological information. The expected result is Negative. Fact Sheet for Patients:   PinkCheek.be Fact Sheet for Healthcare Providers:  GravelBags.it This test is not yet ap proved or cleared by the [place] FDA and  has been authorized for detection and/or diagnosis of SARS-CoV-2 by FDA under an Emergency Use Authorization (EUA). This EUA will remain  in effect (meaning this test can be used) for the duration of the COVID-19 declaration under Section 564(b)(1) of the Act, 21 U.S.C. section 360bbb-3(b)(1), unless the authorization is terminated or revoked sooner.    Influenza A by PCR NEGATIVE NEGATIVE Final   Influenza B by PCR NEGATIVE NEGATIVE Final    Comment: (NOTE) The Xpert Xpress SARS-CoV-2/FLU/RSV assay is intended as an aid in  the diagnosis of influenza from Nasopharyngeal swab specimens and  should not be used as a sole basis for treatment. Nasal washings and  aspirates are unacceptable for Xpert Xpress SARS-CoV-2/FLU/RSV  testing. Fact Sheet for Patients: PinkCheek.be Fact Sheet for Healthcare Providers: GravelBags.it This test is not yet approved or cleared by the [place] FDA and  has been authorized for detection and/or diagnosis of SARS-CoV-2 by  FDA under an Emergency Use Authorization (EUA). This EUA will remain  in effect (meaning this test can be used) for the duration of the  Covid-19 declaration under Section 564(b)(1) of the Act, 21  U.S.C. section 360bbb-3(b)(1), unless the authorization is  terminated or revoked. Performed at [facility], [address], [place], [place] [address]   Blood culture (routine x 2)     Status: None (Preliminary result)   Collection Time: 09/22/19  6:32 AM   Specimen: BLOOD  Result Value Ref Range Status   Specimen Description BLOOD LEFT HAND  Final   Special Requests   Final    BOTTLES DRAWN AEROBIC AND ANAEROBIC Blood Culture adequate volume   Culture   Final    NO GROWTH 1  DAY Performed at [facility], [address] [place], [place] [address]    Report Status PENDING  Incomplete  Blood culture (routine x 2)     Status: None (Preliminary result)   Collection Time: 09/22/19  6:39 AM   Specimen: BLOOD  Result Value Ref Range Status   Specimen Description BLOOD LEFT HAND  Final   Special Requests   Final    BOTTLES DRAWN AEROBIC AND ANAEROBIC Blood  Culture adequate volume   Culture   Final    NO GROWTH 1 DAY Performed at [facility], [address] [place], [place] [address]    Report Status PENDING  Incomplete  MRSA PCR Screening     Status: None   Collection Time: 09/22/19  8:45 PM   Specimen: Nasal Mucosa; Nasopharyngeal  Result Value Ref Range Status   MRSA by PCR NEGATIVE NEGATIVE Final    Comment:        The GeneXpert MRSA Assay (FDA approved for NASAL specimens only), is one component of a comprehensive MRSA colonization surveillance program. It is not intended to diagnose MRSA infection nor to guide or monitor treatment for MRSA infections. Performed at [facility], [address], [place], [place] [address]     Radiology Reports DG Chest [place] 1 View  Result Date: 09/22/2019 CLINICAL DATA:  Shortness of breath EXAM: PORTABLE CHEST 1 VIEW COMPARISON:  04/20/2019 FINDINGS: Patchy bilateral airspace opacity most dense in the peripheral left lung. Cardiomegaly. No effusion or pneumothorax. IMPRESSION: 1. Patchy bilateral pneumonia. 2. Chronic cardiomegaly. Electronically Signed   By: Monte Fantasia M.D.   On: 09/22/2019 05:55     CBC Recent Labs  Lab 09/22/19 0530 09/23/19 0535  WBC 11.0* 9.9  HGB 11.6* 10.7*  HCT 35.7* 32.9*  PLT 171 155  MCV 114.4* 113.4*  MCH 37.2* 36.9*  MCHC 32.5 32.5  RDW 14.7 14.8  LYMPHSABS 2.4  --   MONOABS 0.9  --   EOSABS 0.1  --   BASOSABS 0.1  --     Chemistries  Recent Labs  Lab 09/22/19 0530 09/22/19 1859 09/23/19 0535  NA 137  --  136  K 4.7  --  4.5  CL 96*  --  91*  CO2 21*  --   25  GLUCOSE 165*  --  134*  BUN 62*  --  40*  CREATININE 12.94*  --  8.79*  CALCIUM 10.0  --  9.6  MG  --  1.8 1.9  AST  --   --  21  ALT  --   --  21  ALKPHOS  --   --  63  BILITOT  --   --  1.0   ------------------------------------------------------------------------------------------------------------------ No results for input(s): CHOL, HDL, LDLCALC, TRIG, CHOLHDL, LDLDIRECT in the last 72 hours.  No results found for: HGBA1C ------------------------------------------------------------------------------------------------------------------ Recent Labs    09/22/19 1859  TSH 4.301   ------------------------------------------------------------------------------------------------------------------ No results for input(s): VITAMINB12, FOLATE, FERRITIN, TIBC, IRON, RETICCTPCT in the last 72 hours.  Coagulation profile Recent Labs  Lab 09/17/19 0000 09/22/19 0530 09/23/19 0535  INR 2.1 2.1* 2.2*    No results for input(s): DDIMER in the last 72 hours.  Cardiac Enzymes No results for input(s): CKMB, TROPONINI, MYOGLOBIN in the last 168 hours.  Invalid input(s): CK ------------------------------------------------------------------------------------------------------------------    Component Value Date/Time   BNP 344.0 (H) 04/08/2019 1600     Roxan Hockey M.D on 09/23/2019 at 6:59 PM  Go to [email or web address] - for contact info  [facility] - Office  [phone]

## 2019-09-23 NOTE — Progress Notes (Addendum)
ANTICOAGULATION CONSULT NOTE -  Pharmacy Consult for warfarin Indication: atrial fibrillation  No Known Allergies  Patient Measurements: Height: 5\' 4"  (162.6 cm) Weight: 218 lb 14.7 oz (99.3 kg) IBW/kg (Calculated) : 54.7   Vital Signs: Temp: 98.8 F (37.1 C) (02/16 0400) Temp Source: Oral (02/16 0400) BP: 110/62 (02/16 0700) Pulse Rate: 115 (02/16 0700)  Labs: Recent Labs    09/22/19 0530 09/23/19 0535  HGB 11.6* 10.7*  HCT 35.7* 32.9*  PLT 171 155  LABPROT 23.6* 24.1*  INR 2.1* 2.2*  CREATININE 12.94* 8.79*    Estimated Creatinine Clearance: 5.8 mL/min (A) (by C-G formula based on SCr of 8.79 mg/dL (H)).   Medical History: Past Medical History:  Diagnosis Date  . Anemia   . Atrial fibrillation ([place])   . Chest pain 05/15/2006   Stress test negative for ischemia  . Diastolic heart failure ([place])   . Diverticulosis   . ESRD on hemodialysis ([place])   . GERD (gastroesophageal reflux disease)   . Headache   . Mixed hyperlipidemia   . Peripheral neuropathy   . PONV (postoperative nausea and vomiting)   . Systemic hypertension   . Uterine cancer (HCC)     Medications:  Medications Prior to Admission  Medication Sig Dispense Refill Last Dose  . acetaminophen (TYLENOL) 500 MG tablet Take 1,000 mg by mouth every 6 (six) hours as needed for moderate pain.    Past Week at Unknown time  . albuterol (VENTOLIN HFA) 108 (90 Base) MCG/ACT inhaler Inhale 2 puffs into the lungs every 4 (four) hours as needed for wheezing or shortness of breath (cough, shortness of breath or wheezing.). 18 g 1 09/22/2019 at Unknown time  . Cholecalciferol (VITAMIN D) 2000 UNITS CAPS Take 2,000 Units by mouth every evening.    09/21/2019 at Unknown time  . fluticasone (FLOVENT HFA) 44 MCG/ACT inhaler Inhale 2 puffs into the lungs 2 (two) times daily. 1 Inhaler 2 Past Month at Unknown time  . gabapentin (NEURONTIN) 100 MG capsule Take 100 mg by mouth 2 (two) times daily.    09/21/2019 at Unknown  time  . metoprolol tartrate (LOPRESSOR) 25 MG tablet Take 1 tablet (25 mg total) by mouth 2 (two) times daily. 60 tablet 1 09/21/2019 at 1830  . Multiple Vitamins-Minerals (PRESERVISION AREDS 2 PO) Take 1 tablet by mouth 2 (two) times daily.   09/21/2019 at Unknown time  . multivitamin (RENA-VIT) TABS tablet Take 1 tablet by mouth every evening.    09/21/2019 at Unknown time  . Omega-3 Fatty Acids (OMEGA-3 FISH OIL) 300 MG CAPS Take 300 mg by mouth 2 (two) times daily.   09/21/2019 at Unknown time  . omeprazole (PRILOSEC) 20 MG capsule Take 20 mg by mouth every morning.    09/21/2019 at Unknown time  . Propylene Glycol (SYSTANE BALANCE) 0.6 % SOLN Place 1 drop into both eyes 3 (three) times daily as needed (for burning or dry eyes).    Past Month at Unknown time  . RENVELA 800 MG tablet Take 2,400 mg by mouth 3 (three) times daily with meals. Takes 1 tablet with snacks   09/21/2019 at Unknown time  . warfarin (COUMADIN) 5 MG tablet TAKE 1 & 1/2 TABLETS BY MOUTH (ONE & ONE-HALF) TO 2 TABLETS ONCE DAILY AS DIRECTED BY COUMADIN CLINIC (Patient taking differently: Take 5-7.5 mg by mouth daily at 6 PM. TAKE 1 TABLET BY MOUTH ON Monday AND 1 1/2 TABLETS BY MOUTH (ONE & ONE-HALF) ON Tuesday, Wednesday, Thursday,  Friday, Saturday, Sunday.) 150 tablet 0 09/21/2019 at 1830  . predniSONE (STERAPRED UNI-PAK 21 TAB) 10 MG (21) TBPK tablet Take by mouth daily. Take 6 tabs by mouth daily  for 2 days, then 5 tabs for 2 days, then 4 tabs for 2 days, then 3 tabs for 2 days, 2 tabs for 2 days, then 1 tab by mouth daily for 2 days (Patient not taking: Reported on 09/22/2019) 42 tablet 0 Not Taking at Unknown time    Assessment: Pharmacy consulted to dose warfarin in patient with atrial fibrillation.  Patient's INR on admission is 2.1 with last dose given 2/14 1830.  Coumadin 10mg  dose ordered 2/15 not given. Patient now started on amiodarone, will have to be careful with dosing coumadin d/t drug interaction. INR is therapeutic  today at 2.2. Will give usual dose today, since dose yesterday not given.  Home dose listed as 10 mg every Mon and 7.5 mg ROW per anticoag clinic.  Goal of Therapy:  INR 2-3 Monitor platelets by anticoagulation protocol: Yes   Plan:  Warfarin 7.5 mg x 1 dose. Monitor daily INR and s/s of bleeding.  Isac Sarna, BS Vena [place], BCPS Clinical Pharmacist Pager [phone] 09/23/2019,8:42 AM

## 2019-09-23 NOTE — Consult Note (Addendum)
Cardiology Consult    Patient ID: Sabrina Beck; [id number]; 1940-12-05   Admit date: 09/22/2019 Date of Consult: 09/23/2019  Primary Care Provider: Redmond School, MD Primary Cardiologist: Sanda Klein, MD   Patient Profile    Sabrina Beck is a 79 y.o. female with past medical history of permanent atrial fibrillation (on Coumadin for anticoagulation), chronic combined systolic and diastolic CHF (EF 95% by echo in 08/2018), HTN and ESRD who is being seen today for the evaluation of atrial fibrillation with RVR at the request of Dr. Denton Brick.   History of Present Illness    Sabrina Beck was last evaluated by the Cardiology service during an admission in 04/2019 for sepsis in the setting of HCAP and colitis, found to have atrial fibrillation with RVR. She had been on Lopressor 25 mg twice daily prior to admission and was placed on an Esmolol drip to avoid Digoxin in the setting of ESRD. Rates remained poorly controlled and she was also started on IV Amiodarone. As her underlying illness improved, heart rate also improved and she was discharged on Lopressor 25 mg twice daily and Amiodarone was fully discontinued given her permanent atrial fibrillation.  At her follow-up visit on 06/05/2019, rates overall remained stable in the 90's, therefore she was continued on Lopressor 25 mg twice daily.  She presented to [facility] ED yesterday morning for evaluation of worsening dyspnea and generalized weakness. Denied any associated fever or chills. Reported symptoms acutely developed Monday morning. Denies any associated chest pain or palpitations. She does not check her vitals regularly but Home Health comes out once weekly and she says everything has been "normal".   Initial labs showed WBC 11.0, Hgb 11.6, platelets 171, Na+ 137, K+ 4.7 and creatinine 12.94. INR 2.1. Negaitive for COVID and Influenza. Lactic Acid 2.2. Mg 1.8. TSH 4.301. CXR showed bilateral PNA and chronic cardiomegaly. EKG showed  atrial fibrillation with RVR, HR 131 with LAD and nonspecific IVCD.   She has been started on Rocephin and Azithromycin for her PNA. She was started on IV Cardizem at the time of admission but developed hypotension, specifically worsened with HD. She was switched to IV Amiodarone yesterday evening and has been continued on PTA Lopressor 25mg  BID.   Past Medical History:  Diagnosis Date  . Anemia   . Atrial fibrillation ([place])   . Chest pain 05/15/2006   Stress test negative for ischemia  . Diastolic heart failure ([place])   . Diverticulosis   . ESRD on hemodialysis ([place])   . GERD (gastroesophageal reflux disease)   . Headache   . Mixed hyperlipidemia   . Peripheral neuropathy   . PONV (postoperative nausea and vomiting)   . Systemic hypertension   . Uterine cancer [facility])     Past Surgical History:  Procedure Laterality Date  . ABDOMINAL HYSTERECTOMY    . APPENDECTOMY    . AV FISTULA PLACEMENT Left 11/17/2013   Procedure: CREATION OF LEFT RADIOCEPHALIC ARTERIOVENOUS (AV) FISTULA ;  Surgeon: [name], MD;  Location: [place];  Service: Vascular;  Laterality: Left;  . BREAST SURGERY Left    tumors non canerous 2- 3 different surgeries  . CATARACT EXTRACTION W/PHACO Right 10/26/2016   Procedure: CATARACT EXTRACTION PHACO AND INTRAOCULAR LENS PLACEMENT (IOC) CDE= 11.91;  Surgeon: Tonny Branch, MD;  Location: [facility];  Service: Ophthalmology;  Laterality: Right;  right - pt knows to arrive at 6:30  . CATARACT EXTRACTION W/PHACO Left 11/30/2016   Procedure: CATARACT EXTRACTION PHACO  AND INTRAOCULAR LENS PLACEMENT (IOC) CDE - 7.92 ;  Surgeon: Tonny Branch, MD;  Location: [facility];  Service: Ophthalmology;  Laterality: Left;  left  . CHOLECYSTECTOMY    . COLONOSCOPY W/ BIOPSIES AND POLYPECTOMY    . FISTULA SUPERFICIALIZATION Left 05/19/2014   Procedure: FISTULA SUPERFICIALIZATION-LEFT ARM;  Surgeon: [name], MD;  Location: [place];  Service: Vascular;  Laterality: Left;  . INSERTION OF  DIALYSIS CATHETER N/A 01/24/2014   Procedure: INSERTION OF DIALYSIS CATHETER;  Surgeon: Rosetta Posner, MD;  Location: [facility] OR;  Service: Vascular;  Laterality: N/A;  . KNEE ARTHROSCOPY    . TUBAL LIGATION       Home Medications:  Prior to Admission medications   Medication Sig Start Date End Date Taking? Authorizing Provider  acetaminophen (TYLENOL) 500 MG tablet Take 1,000 mg by mouth every 6 (six) hours as needed for moderate pain.    Yes [provider]  albuterol (VENTOLIN HFA) 108 (90 Base) MCG/ACT inhaler Inhale 2 puffs into the lungs every 4 (four) hours as needed for wheezing or shortness of breath (cough, shortness of breath or wheezing.). 04/25/19  Yes Johnson, Clanford L, MD  Cholecalciferol (VITAMIN D) 2000 UNITS CAPS Take 2,000 Units by mouth every evening.    Yes [provider]  fluticasone (FLOVENT HFA) 44 MCG/ACT inhaler Inhale 2 puffs into the lungs 2 (two) times daily. 04/25/19 04/24/20 Yes Johnson, Clanford L, MD  gabapentin (NEURONTIN) 100 MG capsule Take 100 mg by mouth 2 (two) times daily.  09/13/18  Yes [provider]  metoprolol tartrate (LOPRESSOR) 25 MG tablet Take 1 tablet (25 mg total) by mouth 2 (two) times daily. 04/25/19  Yes Johnson, Clanford L, MD  Multiple Vitamins-Minerals (PRESERVISION AREDS 2 PO) Take 1 tablet by mouth 2 (two) times daily.   Yes [provider]  multivitamin (RENA-VIT) TABS tablet Take 1 tablet by mouth every evening.  07/06/18  Yes [provider]  Omega-3 Fatty Acids (OMEGA-3 FISH OIL) 300 MG CAPS Take 300 mg by mouth 2 (two) times daily.   Yes [provider]  omeprazole (PRILOSEC) 20 MG capsule Take 20 mg by mouth every morning.    Yes [provider]  Propylene Glycol (SYSTANE BALANCE) 0.6 % SOLN Place 1 drop into both eyes 3 (three) times daily as needed (for burning or dry eyes).    Yes [provider]  RENVELA 800 MG tablet Take 2,400 mg by mouth 3 (three) times daily with  meals. Takes 1 tablet with snacks 04/17/15  Yes [provider]  warfarin (COUMADIN) 5 MG tablet TAKE 1 & 1/2 TABLETS BY MOUTH (ONE & ONE-HALF) TO 2 TABLETS ONCE DAILY AS DIRECTED BY COUMADIN CLINIC Patient taking differently: Take 7.5-10 mg by mouth daily at 6 PM. TAKE 2 TABLET BY MOUTH ON Monday AND 1 1/2 TABLETS BY MOUTH (ONE & ONE-HALF) ON Tuesday, Wednesday, Thursday,  Friday, Saturday, Sunday. 08/05/19  Yes Lendon Colonel, NP  predniSONE (STERAPRED UNI-PAK 21 TAB) 10 MG (21) TBPK tablet Take by mouth daily. Take 6 tabs by mouth daily  for 2 days, then 5 tabs for 2 days, then 4 tabs for 2 days, then 3 tabs for 2 days, 2 tabs for 2 days, then 1 tab by mouth daily for 2 days Patient not taking: Reported on 09/22/2019 07/02/19   Lestine Box, PA-C    Inpatient Medications: Scheduled Meds: . budesonide  0.5 mg Inhalation BID  . Chlorhexidine Gluconate Cloth  6 each  Topical Q0600  . Chlorhexidine Gluconate Cloth  6 each Topical Q0600  . cholecalciferol  2,000 Units Oral QPM  . gabapentin  100 mg Oral BID  . guaiFENesin  600 mg Oral BID  . levalbuterol  0.63 mg Nebulization TID  . metoprolol tartrate  2.5 mg Intravenous Q6H  . multivitamin  1 tablet Oral QPM  . multivitamin-lutein  1 capsule Oral BID  . omega-3 acid ethyl esters  1,000 mg Oral BID  . pantoprazole  40 mg Oral Daily  . sevelamer carbonate  2,400 mg Oral TID WC  . sodium chloride flush  3 mL Intravenous Q12H  . warfarin  10 mg Oral ONCE-1800  . warfarin  7.5 mg Oral Once  . Warfarin - Pharmacist Dosing Inpatient   Does not apply q1800   Continuous Infusions: . sodium chloride 250 mL (09/23/19 0820)  . sodium chloride    . sodium chloride    . amiodarone 30 mg/hr (09/23/19 0848)  . azithromycin 500 mg (09/23/19 3474)  . cefTRIAXone (ROCEPHIN)  IV    . diltiazem (CARDIZEM) infusion Stopped (09/22/19 1915)  . vancomycin     PRN Meds: sodium chloride, sodium chloride, sodium chloride, acetaminophen **OR**  acetaminophen, guaiFENesin-dextromethorphan, lidocaine (PF), lidocaine-prilocaine, ondansetron (ZOFRAN) IV, ondansetron **OR** ondansetron (ZOFRAN) IV, pentafluoroprop-tetrafluoroeth, polyethylene glycol, polyvinyl alcohol, sodium chloride flush, traZODone  Allergies:   No Known Allergies  Social History:   Social History   Socioeconomic History  . Marital status: Widowed    Spouse name: Not on file  . Number of children: 2  . Years of education: Not on file  . Highest education level: High school graduate  Occupational History  . Occupation: retired Oncologist   Tobacco Use  . Smoking status: Former Smoker    Quit date: 08/08/1999    Years since quitting: 20.1  . Smokeless tobacco: Never Used  Substance and Sexual Activity  . Alcohol use: No    Alcohol/week: 0.0 standard drinks  . Drug use: No  . Sexual activity: Not Currently  Other Topics Concern  . Not on file  Social History Narrative   Lives in a one story home.  One of her sons lives with her.  Has 2 sons.  Retired Oncologist.  Eduction: high school.   Social Determinants of Health   Financial Resource Strain:   . Difficulty of Paying Living Expenses: Not on file  Food Insecurity:   . Worried About Charity fundraiser in the Last Year: Not on file  . Ran Out of Food in the Last Year: Not on file  Transportation Needs: No Transportation Needs  . Lack of Transportation (Medical): No  . Lack of Transportation (Non-Medical): No  Physical Activity:   . Days of Exercise per Week: Not on file  . Minutes of Exercise per Session: Not on file  Stress:   . Feeling of Stress : Not on file  Social Connections: Unknown  . Frequency of Communication with Friends and Family: Not on file  . Frequency of Social Gatherings with Friends and Family: Not on file  . Attends Religious Services: Not on file  . Active Member of Clubs or Organizations: Not on file  . Attends Archivist Meetings: Not on file  . Marital  Status: Widowed  Intimate Partner Violence:   . Fear of Current or Ex-Partner: Not on file  . Emotionally Abused: Not on file  . Physically Abused: Not on file  . Sexually Abused: Not on file  Family History:    Family History  Problem Relation Age of Onset  . Diabetes Mother   . Hypertension Mother   . Diabetes Father   . Heart disease Father        before age 34  . Heart attack Father   . Diabetes Sister   . Hyperlipidemia Sister   . Hypertension Sister   . Cancer Brother   . Diabetes Brother   . Heart disease Brother   . Hypertension Brother   . Heart attack Brother   . Colon cancer Neg Hx       Review of Systems    General:  No chills, fever, night sweats or weight changes. Positive for generalized weakness.  Cardiovascular:  No chest pain, edema, orthopnea, palpitations, paroxysmal nocturnal dyspnea. Positive for dyspnea on exertion.  Dermatological: No rash, lesions/masses Respiratory: No cough, Positive for dyspnea. Urologic: No hematuria, dysuria Abdominal:   No nausea, vomiting, diarrhea, bright red blood per rectum, melena, or hematemesis Neurologic:  No visual changes,changes in mental status.  All other systems reviewed and are otherwise negative except as noted above.  Physical Exam/Data    Vitals:   09/23/19 0400 09/23/19 0500 09/23/19 0700 09/23/19 0747  BP: 101/84  110/62   Pulse: (!) 113 (!) 116 (!) 115   Resp: (!) 34 (!) 25 (!) 23   Temp: 98.8 F (37.1 C)     TempSrc: Oral     SpO2: 93% 98% 95% 93%  Weight:  99.3 kg    Height:        Intake/Output Summary (Last 24 hours) at 09/23/2019 0958 Last data filed at 09/23/2019 0745 Gross per 24 hour  Intake 682.04 ml  Output --  Net 682.04 ml   Filed Weights   09/22/19 1600 09/22/19 2053 09/23/19 0500  Weight: 97.5 kg 99.3 kg 99.3 kg   Body mass index is 37.58 kg/m.   General: Pleasant, female sounding in NAD Psych: Normal affect. Neuro: Alert and oriented X 3. Moves all extremities  spontaneously. HEENT: Normal  Neck: Supple without bruits or JVD. Lungs:  Resp regular and unlabored, CTA without wheezing or rales. Heart: Irregularly irregular, tachycardiac. no s3, s4, or murmurs. Abdomen: Soft, non-tender, non-distended, BS + x 4.  Extremities: No clubbing, cyanosis or lower extremity edema. DP/PT/Radials 2+ and equal bilaterally.   EKG:  The EKG was personally reviewed and demonstrates: Atrial fibrillation with RVR, HR 131 with LAD and nonspecific IVCD.   Telemetry:  Telemetry was personally reviewed and demonstrates: Atrial fibrillation, HR in 110's to 130's. Occasional PVC's.    Labs/Studies     Relevant CV Studies:  Echocardiogram: 08/22/2018 Study Conclusions   - Left ventricle: The cavity size was normal. Wall thickness was  increased in a pattern of moderate LVH. The estimated ejection  fraction was 45%. There is hypokinesis of the midinferoseptal  myocardium. The study was not technically sufficient to allow  evaluation of LV diastolic dysfunction due to atrial  fibrillation.  - Aortic valve: Mildly calcified annulus. Trileaflet; mildly  calcified leaflets.  - Mitral valve: Mildly calcified annulus. There was mild  regurgitation.  - Left atrium: The atrium was moderately dilated.  - Right atrium: The atrium was mildly to moderately dilated.  - Atrial septum: No defect or patent foramen ovale was identified.  - Tricuspid valve: There was mild regurgitation.  - Pulmonary arteries: PA peak pressure: 21 mm Hg (S).  - Pericardium, extracardiac: There was no pericardial effusion.   Laboratory Data:  Chemistry Recent Labs  Lab 09/22/19 0530 09/23/19 0535  NA 137 136  K 4.7 4.5  CL 96* 91*  CO2 21* 25  GLUCOSE 165* 134*  BUN 62* 40*  CREATININE 12.94* 8.79*  CALCIUM 10.0 9.6  GFRNONAA 2* 4*  GFRAA 3* 4*  ANIONGAP 20* 20*    Recent Labs  Lab 09/23/19 0535  PROT 6.4*  ALBUMIN 3.1*  AST 21  ALT 21  ALKPHOS 63  BILITOT  1.0   Hematology Recent Labs  Lab 09/22/19 0530 09/23/19 0535  WBC 11.0* 9.9  RBC 3.12* 2.90*  HGB 11.6* 10.7*  HCT 35.7* 32.9*  MCV 114.4* 113.4*  MCH 37.2* 36.9*  MCHC 32.5 32.5  RDW 14.7 14.8  PLT 171 155   Cardiac EnzymesNo results for input(s): TROPONINI in the last 168 hours. No results for input(s): TROPIPOC in the last 168 hours.  BNPNo results for input(s): BNP, PROBNP in the last 168 hours.  DDimer No results for input(s): DDIMER in the last 168 hours.  Radiology/Studies:  DG Chest Port 1 View  Result Date: 09/22/2019 CLINICAL DATA:  Shortness of breath EXAM: PORTABLE CHEST 1 VIEW COMPARISON:  04/20/2019 FINDINGS: Patchy bilateral airspace opacity most dense in the peripheral left lung. Cardiomegaly. No effusion or pneumothorax. IMPRESSION: 1. Patchy bilateral pneumonia. 2. Chronic cardiomegaly. Electronically Signed   By: Monte Fantasia M.D.   On: 09/22/2019 05:55     Assessment & Plan    1. Atrial Fibrillation with RVR - She has a history of permanent atrial fibrillation but is currently admitted for PNA and noted to be in atrial fibrillation with RVR upon arrival. Rates improved with IV Cardizem but she developed significant hypotension with this and worsening hypotension during HD. Agree with transitioning to IV Amiodarone for rate control strategy. Digoxin not ideal given ESRD. Discussed with Dr. Domenic Polite and will use IV Lopressor over PO Lopressor for now. Will write for IV Lopressor 2.5mg  Q6H with hold parameters. Required Esmolol drip during prior admission so can consider this if rates remain elevated but suspect they will improve with treatment of her underlying illness.  - remains on Coumadin for anticoagulation. Appreciate Pharmacy's assistance with dosing.   2. Chronic Combined Systolic and Diastolic CHF  - EF 69% by echo in 08/2018.  She does not appear volume overloaded by examination and volume status is managed by HD.  Would anticipate a repeat  echocardiogram later this admission once rates improve to make sure she has not developed a tachycardia-mediated cardiomyopathy given her recurrent episodes of atrial fibrillation with RVR over the past several months.  3. HTN - She has actually been hypotensive this admission in the setting of utilizing AV nodal blocking agents for atrial fibrillation with RVR. PTA PO Lopressor 25 mg twice daily held in favor of using IV Lopressor as outlined above.  4. ESRD - on HD. MWF schedule.   5. Sepsis in the setting of PNA - she has been started on Rocephin and Azithromycin for PNA. Further management per admitting team.   For questions or updates, please contact [place] Please consult [email or web address] for contact info under Cardiology/STEMI.  Signed, Erma Heritage, PA-C 09/23/2019, 9:58 AM Pager: [phone]   Attending note:  Patient seen and examined.  I reviewed her records and discussed the case with Ms. Ahmed Prima PA-C.  I agree with her above findings.  Patient is currently admitted with sepsis in the setting of pneumonia, noted to have RVR with baseline permanent atrial fibrillation in the  setting of physiologic stress.  Home regimen includes Lopressor 25 mg twice daily.  She was initially treated with IV diltiazem, however blood pressure decreased and limited further therapy.  She is currently on IV amiodarone and we are adding as needed IV Lopressor in divided dose for further management.  She has had previous similar issues with heart rate control during acute illness, was on esmolol infusion at one point during a previous hospital stay. She otherwise continues on Coumadin for stroke prophylaxis.  On examination this morning patient is in no acute distress.  She is afebrile. Heart rate in the 110-120 range in atrial fibrillation.  Systolic blood pressure 50-093.  Lungs exhibit coarse breath sounds, prolonged expiratory phase.  Cardiac exam with irregularly irregular rhythm.  Lab  work shows potassium 4.5, BUN 40, creatinine 8.79, WBC 9.9, hemoglobin 10.7, platelets 155, INR 2.2.  Chest x-ray reports bilateral patchy infiltrates suggestive of pneumonia.  I personally reviewed her ECG which shows rapid atrial fibrillation with IVCD and increased voltage associate with left anterior fascicular block, intermittent PVCs.  Permanent atrial fibrillation, currently with RVR in the setting of physiologic stressors with sepsis and pneumonia.  Agree with IV amiodarone for now, we have also added as needed IV Lopressor.  Continue Coumadin for stroke prophylaxis.  Hopefully as she improves clinically, heart rate will be easier to manage and we can convert her back to an oral regimen.  Satira Sark, M.D., F.A.C.C.

## 2019-09-24 DIAGNOSIS — I5022 Chronic systolic (congestive) heart failure: Secondary | ICD-10-CM

## 2019-09-24 DIAGNOSIS — I4891 Unspecified atrial fibrillation: Secondary | ICD-10-CM

## 2019-09-24 DIAGNOSIS — Z7901 Long term (current) use of anticoagulants: Secondary | ICD-10-CM

## 2019-09-24 DIAGNOSIS — I10 Essential (primary) hypertension: Secondary | ICD-10-CM

## 2019-09-24 DIAGNOSIS — A419 Sepsis, unspecified organism: Principal | ICD-10-CM

## 2019-09-24 LAB — PROTIME-INR
INR: 1.8 — ABNORMAL HIGH (ref 0.8–1.2)
Prothrombin Time: 20.8 seconds — ABNORMAL HIGH (ref 11.4–15.2)

## 2019-09-24 LAB — BASIC METABOLIC PANEL
BUN: 63 mg/dL — ABNORMAL HIGH (ref 8–23)
CO2: 24 mmol/L (ref 22–32)
Calcium: 9.8 mg/dL (ref 8.9–10.3)
Chloride: 91 mmol/L — ABNORMAL LOW (ref 98–111)
Creatinine, Ser: 11.04 mg/dL — ABNORMAL HIGH (ref 0.44–1.00)
GFR calc Af Amer: 3 mL/min — ABNORMAL LOW (ref >60)
GFR calc non Af Amer: 3 mL/min — ABNORMAL LOW (ref >60)
Glucose, Bld: 138 mg/dL — ABNORMAL HIGH (ref 70–99)
Potassium: 5.1 mmol/L (ref 3.5–5.1)
Sodium: 137 mmol/L (ref 135–145)

## 2019-09-24 LAB — PHOSPHORUS: Phosphorus: 8.9 mg/dL — ABNORMAL HIGH (ref 2.5–4.6)

## 2019-09-24 MED ORDER — WARFARIN SODIUM 7.5 MG PO TABS
7.5000 mg | ORAL_TABLET | Freq: Once | ORAL | Status: AC
  Administered 2019-09-24: 7.5 mg via ORAL
  Filled 2019-09-24: qty 1

## 2019-09-24 MED ORDER — METOPROLOL TARTRATE 5 MG/5ML IV SOLN
5.0000 mg | Freq: Four times a day (QID) | INTRAVENOUS | Status: DC
  Administered 2019-09-24 – 2019-09-25 (×4): 5 mg via INTRAVENOUS
  Filled 2019-09-24 (×4): qty 5

## 2019-09-24 MED ORDER — ALBUMIN HUMAN 25 % IV SOLN
25.0000 g | Freq: Once | INTRAVENOUS | Status: AC
  Administered 2019-09-24: 14:00:00 25 g via INTRAVENOUS

## 2019-09-24 NOTE — Progress Notes (Addendum)
[facility] Progress Note  Name: Sabrina Beck MRN: [id number] DOB: 1941/03/26  Chief Complaint:  Shortness of breath   Subjective:   Has been on 5 liters oxygen this AM.  Last HD on 2/15 with 2.3 liters UF with HD (UF was limited by hypotension); she was since transitioned to [facility] and IV lopressor.   Called her outpatient unit for orders on 2/15, 2/16 and 2/17 for HD orders and HD RN has called this AM as well - no answer and other clinics not able to see her orders.    Review of systems:    Shortness of breath - she feels like she is able to cough a little better today No n/v  No cp Hasn't had much this AM Doesn't make urine  ------ Background on consult:  Sabrina Beck is a 79 y.o. female with a history of end-stage renal disease, chronic diastolic CHF, anemia, and atrial fibrillation who presented to the hospital with shortness of breath.  She was found to have patchy bilateral pneumonia on chest x-ray.  Course here has also been complicated by A fib with RVR and she was ordered a diltiazem infusion.  She has been wheezing in the ER and has required supplemental oxygen at 3.5 - 4 L most recently.  She states that she noticed her shortness of breath when she woke up today.  I have discussed with the dialysis nurse to let her know the patient is here and to anticipate orders for dialysis soon.  She normally dialyzes Monday Wednesday Friday at [facility] [address]  States that she did go to dialysis on Friday and stayed her full treatment and normally does not have issues with tolerating dialysis.   She denies any nausea or vomiting or dizziness.  Denies any subjective fever or cough.  Just had a breathing treatment - still short of breath.  Thinks that she may carry fluid around her abdomen and lungs.   Attempted to call her HD unit; they did not answer my call for her orders.    Intake/Output Summary (Last 24 hours) at 09/24/2019 0827 Last data filed at  09/23/2019 1600 Gross per 24 hour  Intake 602.34 ml  Output --  Net 602.34 ml    Vitals:  Vitals:   09/24/19 0600 09/24/19 0747 09/24/19 0757 09/24/19 0803  BP: 132/89     Pulse:  (!) 132    Resp: 19 (!) 27    Temp:  (!) 97.5 F (36.4 C)    TempSrc:  Oral    SpO2:  92% 95% 100%  Weight:      Height:         Physical Exam:    General adult female in bed in no acute distress at rest HEENT normocephalic atraumatic extraocular movements intact sclera anicteric Neck supple trachea midline Lungs wheezing and increased work of breathing with exertion; on 5 liters oxygen Heart S1S2 tachy to 120's on exam.   intermittently to 130's then back down; no rub Abdomen soft nontender obese habitus Extremities no pitting lower extremity edema appreciated Psych normal mood and affect Neuro alert and oriented x 3 Access LUE AVF bruit and thrill   Medications reviewed    Labs:  BMP Latest Ref Rng & Units 09/24/2019 09/23/2019 09/22/2019  Glucose 70 - 99 mg/dL 138(H) 134(H) 165(H)  BUN 8 - 23 mg/dL 63(H) 40(H) 62(H)  Creatinine 0.44 - 1.00 mg/dL 11.04(H) 8.79(H) 12.94(H)  BUN/Creat Ratio 11 - 26 - - -  Sodium 135 - 145 mmol/L 137 136 137  Potassium 3.5 - 5.1 mmol/L 5.1 4.5 4.7  Chloride 98 - 111 mmol/L 91(L) 91(L) 96(L)  CO2 22 - 32 mmol/L 24 25 21(L)  Calcium 8.9 - 10.3 mg/dL 9.8 9.6 10.0     Assessment/Plan:   # End-stage renal disease on hemodialysis - Continue HD on a Monday Wednesday Friday schedule - next on 2/17 - we are expediting her treatment - Assess dialysis needs daily.  Offered extra tx on 2/16 which pt declined.  # Acute hypoxic respiratory failure -Treatment for pneumonia per primary team - On Supplemental oxygen - manage volume with HD   # Bilateral pneumonia - healthcare associated - Therapies per primary team -  On azithromycin ceftriaxone and Vanc  # Chronic combined systolic and diastolic CHF - optimize volume status with HD   # A. fib with RVR -  control per primary team and cardiology.  Spoke with nursing and they are contacting team re: HR    # Anemia CKD - No acute indication for ESA   # Secondary hyperpara/bone mineral metabolism - outpatient orders not available - have attempted multiple as above  - would continue renvela; check phos in AM - renal diet  Claudia Desanctis, MD 09/24/2019  8:52 AM

## 2019-09-24 NOTE — Procedures (Signed)
     HEMODIALYSIS TREATMENT NOTE:  Difficult cannulation of AVF with clot aspiration x2.  I was able to cannulate a more proximal venous segment / new area with a 16g needle.  Tolerated Qb 350 with stable VP and AP.  UF limited by hypotension despite Albumin administration.  Unable to reach 4L goal.  Net UF 3.2L.    Due to loss of second PIV and contraindications with Amiodarine infusion,  intravenous antibiotics were rescheduled and given during last hour of dialysis.   All blood was returned and hemostasis was achieved in 15 minutes.  Rockwell Alexandria, RN

## 2019-09-24 NOTE — Progress Notes (Signed)
Patient Demographics:    Sabrina Beck, is a 79 y.o. female, DOB - [birth year], HWK:[id number]  Admit date - 09/22/2019   Admitting Physician Deshanna Kama Denton Brick, MD  Outpatient Primary MD for the patient is Redmond School, MD  LOS - 2   Chief Complaint  Patient presents with  . Shortness of Breath        Subjective:    Sabrina Beck today has no fevers, no emesis,  No chest pain,   -Tolerated hemodialysis okay Shortness of breath persist,  requiring 5 L of oxygen  Assessment  & Plan :    Principal Problem:   PNA (pneumonia) Active Problems:   Atrial fibrillation ([place])   Long term current use of anticoagulant therapy   ESRD (end stage renal disease) (HCC)   Anemia   Sepsis (HCC)   Atrial fibrillation with rapid ventricular response (HCC)   Chronic systolic heart failure (HCC)   1)Bilateral community-acquired pneumonia with hypoxic respiratory failure--- requiring 5 L of oxygen, continue IV Rocephin and azithromycin , bronchodilators and mucolytics as ordered  -Supplemental oxygen as ordered  2) chronic Afib --- with RVR, last known EF 45%, patient was treated with IV Cardizem became hypotensive Cardizem was discontinued, tachycardia reoccurred, IV Cardizem was tried again patient became hypotensive again -Continue IV amiodarone, and metoprolol--- rate control remains challenging -Cardiology consult appreciated,  -TSH is 4.3,  -Coumadin for anticoagulation,  INR 1.8  3) sepsis secondary to pneumonia in a hemodialysis patient--- unable to give required IV fluid boluses due to hemodialysis/ESRD status -Sepsis pathophysiology appears to be resolving -Treat as above #1  4)ESRD--nephrology consult for HD appreciated patient usually gets HD Monday Wednesdays and Fridays, she has not missed any HD sessions -underwent hemodialysis in ER with 2.3 liters UF with HD on 09/22/2019 limited by  hypotension -Tolerated HD on 09/24/2019  5)-anemia of CKD--- hemoglobin is stable , EPO/ESA agent per nephrology team  6) acute hypoxic respiratory failure secondary to #1 #2 above--- currently requiring oxygen, hopefully hypoxia will improve with hemodialysis and treatment of pneumonia , as well as with better rate control as above #1  7)HFrEF--acute on chronic combined systolic and diastolic dysfunction CHF--- fluid balance is negative, weight appears not to be accurate, -Continue attempts at rate control, continue to use hemodialysis to address volume status, -Cardiology input appreciated  Disposition/Need for in-Hospital Stay- patient unable to be discharged at this time due to --sepsis secondary to pneumonia requiring IV antibiotics, A. fib with RVR requiring IV amiodarone for rate control, 5 L of oxygen -Not medically ready for discharge home at this time  Code Status : full  Family Communication:  (patient is alert, awake and coherent) Attempted to call Son [name]-- [phone]--MQKM message-   Consults  : Cardiology/nephrology  DVT Prophylaxis  : Coumadin- SCDs   Lab Results  Component Value Date   PLT 155 09/23/2019    Inpatient Medications  Scheduled Meds: . budesonide  0.5 mg Inhalation BID  . Chlorhexidine Gluconate Cloth  6 each Topical Q0600  . Chlorhexidine Gluconate Cloth  6 each Topical Q0600  . cholecalciferol  2,000 Units Oral QPM  . gabapentin  100 mg Oral BID  . guaiFENesin  600 mg Oral BID  . levalbuterol  0.63 mg Nebulization  TID  . metoprolol tartrate  5 mg Intravenous Q6H  . multivitamin  1 tablet Oral QPM  . multivitamin-lutein  1 capsule Oral BID  . omega-3 acid ethyl esters  1,000 mg Oral BID  . pantoprazole  40 mg Oral Daily  . sevelamer carbonate  2,400 mg Oral TID WC  . sodium chloride flush  3 mL Intravenous Q12H  . Warfarin - Pharmacist Dosing Inpatient   Does not apply q1800   Continuous Infusions: . sodium chloride 250 mL (09/23/19  0820)  . sodium chloride    . sodium chloride    . amiodarone 30 mg/hr (09/24/19 1800)  . azithromycin 500 mg (09/24/19 1400)  . cefTRIAXone (ROCEPHIN)  IV 1 g (09/24/19 1445)   PRN Meds:.sodium chloride, sodium chloride, sodium chloride, acetaminophen **OR** acetaminophen, guaiFENesin-dextromethorphan, lidocaine (PF), lidocaine-prilocaine, ondansetron (ZOFRAN) IV, ondansetron **OR** ondansetron (ZOFRAN) IV, pentafluoroprop-tetrafluoroeth, polyethylene glycol, polyvinyl alcohol, sodium chloride flush, traZODone    Anti-infectives (From admission, onward)   Start     Dose/Rate Route Frequency Ordered Stop   09/23/19 1100  vancomycin (VANCOCIN) IVPB 1000 mg/200 mL premix     1,000 mg 200 mL/hr over 60 Minutes Intravenous  Once 09/23/19 1049 09/23/19 1233   09/23/19 1000  azithromycin (ZITHROMAX) 500 mg in sodium chloride 0.9 % 250 mL IVPB     500 mg 250 mL/hr over 60 Minutes Intravenous Every 24 hours 09/22/19 1018     09/23/19 1000  cefTRIAXone (ROCEPHIN) 1 g in sodium chloride 0.9 % 100 mL IVPB     1 g 200 mL/hr over 30 Minutes Intravenous Every 24 hours 09/22/19 1018     09/22/19 1800  vancomycin (VANCOCIN) IVPB 1000 mg/200 mL premix  Status:  Discontinued     1,000 mg 200 mL/hr over 60 Minutes Intravenous Every M-W-F (Hemodialysis) 09/22/19 1300 09/23/19 1049   09/22/19 1030  cefTRIAXone (ROCEPHIN) 1 g in sodium chloride 0.9 % 100 mL IVPB  Status:  Discontinued     1 g 200 mL/hr over 30 Minutes Intravenous Every 24 hours 09/22/19 1015 09/22/19 1018   09/22/19 1030  azithromycin (ZITHROMAX) 500 mg in sodium chloride 0.9 % 250 mL IVPB  Status:  Discontinued     500 mg 250 mL/hr over 60 Minutes Intravenous Every 24 hours 09/22/19 1015 09/22/19 1018   09/22/19 0715  vancomycin (VANCOREADY) IVPB 1500 mg/300 mL     1,500 mg 150 mL/hr over 120 Minutes Intravenous  Once 09/22/19 0704 09/22/19 1600   09/22/19 0700  cefTRIAXone (ROCEPHIN) 1 g in sodium chloride 0.9 % 100 mL IVPB     1  g 200 mL/hr over 30 Minutes Intravenous  Once 09/22/19 0645 09/22/19 0904   09/22/19 0700  azithromycin (ZITHROMAX) 500 mg in sodium chloride 0.9 % 250 mL IVPB     500 mg 250 mL/hr over 60 Minutes Intravenous  Once 09/22/19 0645 09/22/19 1017        Objective:   Vitals:   09/24/19 1624 09/24/19 1630 09/24/19 1700 09/24/19 1800  BP: 100/67 106/61 (!) 98/59 97/68  Pulse: (!) 124 (!) 118 (!) 103 (!) 117  Resp: (!) 26 (!) 30 (!) 28 20  Temp:      TempSrc:      SpO2: 96% 99% 94% 93%  Weight:      Height:        Wt Readings from Last 3 Encounters:  09/24/19 99.9 kg  06/05/19 99.8 kg  05/06/19 98.9 kg     Intake/Output Summary (Last  24 hours) at 09/24/2019 1853 Last data filed at 09/24/2019 1800 Gross per 24 hour  Intake 1256.07 ml  Output 3254 ml  Net -1997.93 ml     Physical Exam  Gen:- Awake Alert, in no acute distress HEENT:- [place].AT, No sclera icterus Nose- [place] 5L/min Neck-Supple Neck,No JVD,.  Lungs-diminished in bases, no wheezing CV- S1, S2 normal, irregularly irregular heart rate in the 120s abd-  +ve B.Sounds, Abd Soft, No tenderness,    Extremity/Skin:-, pedal pulses present  Psych-affect is appropriate, oriented x3 Neuro-generalized weakness, no new focal deficits, no tremors   Data Review:   Micro Results Recent Results (from the past 240 hour(s))  Respiratory Panel by RT PCR (Flu A&B, Covid) - Nasopharyngeal Swab     Status: None   Collection Time: 09/22/19  5:37 AM   Specimen: Nasopharyngeal Swab  Result Value Ref Range Status   SARS Coronavirus 2 by RT PCR NEGATIVE NEGATIVE Final    Comment: (NOTE) SARS-CoV-2 target nucleic acids are NOT DETECTED. The SARS-CoV-2 RNA is generally detectable in upper respiratoy specimens during the acute phase of infection. The lowest concentration of SARS-CoV-2 viral copies this assay can detect is 131 copies/mL. A negative result does not preclude SARS-Cov-2 infection and should not be used as the sole basis for  treatment or other patient management decisions. A negative result may occur with  improper specimen collection/handling, submission of specimen other than nasopharyngeal swab, presence of viral mutation(s) within the areas targeted by this assay, and inadequate number of viral copies (<131 copies/mL). A negative result must be combined with clinical observations, patient history, and epidemiological information. The expected result is Negative. Fact Sheet for Patients:  PinkCheek.be Fact Sheet for Healthcare Providers:  GravelBags.it This test is not yet ap proved or cleared by the [place] FDA and  has been authorized for detection and/or diagnosis of SARS-CoV-2 by FDA under an Emergency Use Authorization (EUA). This EUA will remain  in effect (meaning this test can be used) for the duration of the COVID-19 declaration under Section 564(b)(1) of the Act, 21 U.S.C. section 360bbb-3(b)(1), unless the authorization is terminated or revoked sooner.    Influenza A by PCR NEGATIVE NEGATIVE Final   Influenza B by PCR NEGATIVE NEGATIVE Final    Comment: (NOTE) The Xpert Xpress SARS-CoV-2/FLU/RSV assay is intended as an aid in  the diagnosis of influenza from Nasopharyngeal swab specimens and  should not be used as a sole basis for treatment. Nasal washings and  aspirates are unacceptable for Xpert Xpress SARS-CoV-2/FLU/RSV  testing. Fact Sheet for Patients: PinkCheek.be Fact Sheet for Healthcare Providers: GravelBags.it This test is not yet approved or cleared by the [place] FDA and  has been authorized for detection and/or diagnosis of SARS-CoV-2 by  FDA under an Emergency Use Authorization (EUA). This EUA will remain  in effect (meaning this test can be used) for the duration of the  Covid-19 declaration under Section 564(b)(1) of the Act, 21  U.S.C. section  360bbb-3(b)(1), unless the authorization is  terminated or revoked. Performed at [facility], [address], [place], [place] [address]   Blood culture (routine x 2)     Status: None (Preliminary result)   Collection Time: 09/22/19  6:32 AM   Specimen: BLOOD  Result Value Ref Range Status   Specimen Description BLOOD LEFT HAND  Final   Special Requests   Final    BOTTLES DRAWN AEROBIC AND ANAEROBIC Blood Culture adequate volume   Culture   Final  NO GROWTH 2 DAYS Performed at [facility], [address], [place], [place] [address]    Report Status PENDING  Incomplete  Blood culture (routine x 2)     Status: None (Preliminary result)   Collection Time: 09/22/19  6:39 AM   Specimen: BLOOD  Result Value Ref Range Status   Specimen Description BLOOD LEFT HAND  Final   Special Requests   Final    BOTTLES DRAWN AEROBIC AND ANAEROBIC Blood Culture adequate volume   Culture   Final    NO GROWTH 2 DAYS Performed at [facility], [address] [place], [place] [address]    Report Status PENDING  Incomplete  MRSA PCR Screening     Status: None   Collection Time: 09/22/19  8:45 PM   Specimen: Nasal Mucosa; Nasopharyngeal  Result Value Ref Range Status   MRSA by PCR NEGATIVE NEGATIVE Final    Comment:        The GeneXpert MRSA Assay (FDA approved for NASAL specimens only), is one component of a comprehensive MRSA colonization surveillance program. It is not intended to diagnose MRSA infection nor to guide or monitor treatment for MRSA infections. Performed at [facility], [address] [place], [place] [address]     Radiology Reports DG Chest [place] 1 View  Result Date: 09/22/2019 CLINICAL DATA:  Shortness of breath EXAM: PORTABLE CHEST 1 VIEW COMPARISON:  04/20/2019 FINDINGS: Patchy bilateral airspace opacity most dense in the peripheral left lung. Cardiomegaly. No effusion or pneumothorax. IMPRESSION: 1. Patchy bilateral pneumonia. 2. Chronic cardiomegaly. Electronically  Signed   By: Monte Fantasia M.D.   On: 09/22/2019 05:55     CBC Recent Labs  Lab 09/22/19 0530 09/23/19 0535  WBC 11.0* 9.9  HGB 11.6* 10.7*  HCT 35.7* 32.9*  PLT 171 155  MCV 114.4* 113.4*  MCH 37.2* 36.9*  MCHC 32.5 32.5  RDW 14.7 14.8  LYMPHSABS 2.4  --   MONOABS 0.9  --   EOSABS 0.1  --   BASOSABS 0.1  --     Chemistries  Recent Labs  Lab 09/22/19 0530 09/22/19 1859 09/23/19 0535 09/24/19 0402  NA 137  --  136 137  K 4.7  --  4.5 5.1  CL 96*  --  91* 91*  CO2 21*  --  25 24  GLUCOSE 165*  --  134* 138*  BUN 62*  --  40* 63*  CREATININE 12.94*  --  8.79* 11.04*  CALCIUM 10.0  --  9.6 9.8  MG  --  1.8 1.9  --   AST  --   --  21  --   ALT  --   --  21  --   ALKPHOS  --   --  63  --   BILITOT  --   --  1.0  --    ------------------------------------------------------------------------------------------------------------------ No results for input(s): CHOL, HDL, LDLCALC, TRIG, CHOLHDL, LDLDIRECT in the last 72 hours.  No results found for: HGBA1C ------------------------------------------------------------------------------------------------------------------ Recent Labs    09/22/19 1859  TSH 4.301   ------------------------------------------------------------------------------------------------------------------ No results for input(s): VITAMINB12, FOLATE, FERRITIN, TIBC, IRON, RETICCTPCT in the last 72 hours.  Coagulation profile Recent Labs  Lab 09/22/19 0530 09/23/19 0535 09/24/19 0402  INR 2.1* 2.2* 1.8*    No results for input(s): DDIMER in the last 72 hours.  Cardiac Enzymes No results for input(s): CKMB, TROPONINI, MYOGLOBIN in the last 168 hours.  Invalid input(s): CK ------------------------------------------------------------------------------------------------------------------    Component Value Date/Time  BNP 344.0 (H) 04/08/2019 1600     Roxan Hockey M.D on 09/24/2019 at 6:53 PM  Go to [email or web address] - for contact  info  [facility] - Office  [phone]

## 2019-09-24 NOTE — Progress Notes (Addendum)
Progress Note  Patient Name: Sabrina Beck Date of Encounter: 09/24/2019  Primary Cardiologist: Sanda Klein, MD   Subjective   She is feeling slightly better today with respect to breathing.  Inpatient Medications    Scheduled Meds: . budesonide  0.5 mg Inhalation BID  . Chlorhexidine Gluconate Cloth  6 each Topical Q0600  . Chlorhexidine Gluconate Cloth  6 each Topical Q0600  . cholecalciferol  2,000 Units Oral QPM  . gabapentin  100 mg Oral BID  . guaiFENesin  600 mg Oral BID  . levalbuterol  0.63 mg Nebulization TID  . metoprolol tartrate  2.5 mg Intravenous Q6H  . multivitamin  1 tablet Oral QPM  . multivitamin-lutein  1 capsule Oral BID  . omega-3 acid ethyl esters  1,000 mg Oral BID  . pantoprazole  40 mg Oral Daily  . sevelamer carbonate  2,400 mg Oral TID WC  . sodium chloride flush  3 mL Intravenous Q12H  . warfarin  7.5 mg Oral Once  . Warfarin - Pharmacist Dosing Inpatient   Does not apply q1800   Continuous Infusions: . sodium chloride 250 mL (09/23/19 0820)  . sodium chloride    . sodium chloride    . amiodarone 30 mg/hr (09/24/19 0912)  . azithromycin 500 mg (09/23/19 0737)  . cefTRIAXone (ROCEPHIN)  IV 1 g (09/23/19 1455)  . diltiazem (CARDIZEM) infusion Stopped (09/22/19 1915)   PRN Meds: sodium chloride, sodium chloride, sodium chloride, acetaminophen **OR** acetaminophen, guaiFENesin-dextromethorphan, lidocaine (PF), lidocaine-prilocaine, ondansetron (ZOFRAN) IV, ondansetron **OR** ondansetron (ZOFRAN) IV, pentafluoroprop-tetrafluoroeth, polyethylene glycol, polyvinyl alcohol, sodium chloride flush, traZODone   Vital Signs    Vitals:   09/24/19 0600 09/24/19 0747 09/24/19 0757 09/24/19 0803  BP: 132/89     Pulse:  (!) 132    Resp: 19 (!) 27    Temp:  (!) 97.5 F (36.4 C)    TempSrc:  Oral    SpO2:  92% 95% 100%  Weight:      Height:        Intake/Output Summary (Last 24 hours) at 09/24/2019 0926 Last data filed at 09/23/2019 1600  Gross per 24 hour  Intake 602.34 ml  Output -  Net 602.34 ml   Filed Weights   09/22/19 2053 09/23/19 0500 09/24/19 0500  Weight: 99.3 kg 99.3 kg 99.9 kg   [facility] LPN was present throughout the entirety of the encounter.  Telemetry    Rapid atrial fibrillation, heart rate 110-130 bpm range- Personally Reviewed  ECG    No new tracings- Personally Reviewed  Physical Exam   GEN: No acute distress.   Neck: No JVD Cardiac:  Tachycardic, irregular, no murmurs, rubs, or gallops.  Respiratory:  Expiratory wheezes bilaterally. GI: Soft, nontender, non-distended  MS: No edema; No deformity. Neuro:  Nonfocal  Psych: Normal affect   Labs    Chemistry Recent Labs  Lab 09/22/19 0530 09/23/19 0535 09/24/19 0402  NA 137 136 137  K 4.7 4.5 5.1  CL 96* 91* 91*  CO2 21* 25 24  GLUCOSE 165* 134* 138*  BUN 62* 40* 63*  CREATININE 12.94* 8.79* 11.04*  CALCIUM 10.0 9.6 9.8  PROT  --  6.4*  --   ALBUMIN  --  3.1*  --   AST  --  21  --   ALT  --  21  --   ALKPHOS  --  63  --   BILITOT  --  1.0  --   GFRNONAA 2* 4* 3*  GFRAA 3* 4* 3*  ANIONGAP 20* 20*  --      Hematology Recent Labs  Lab 09/22/19 0530 09/23/19 0535  WBC 11.0* 9.9  RBC 3.12* 2.90*  HGB 11.6* 10.7*  HCT 35.7* 32.9*  MCV 114.4* 113.4*  MCH 37.2* 36.9*  MCHC 32.5 32.5  RDW 14.7 14.8  PLT 171 155    Cardiac EnzymesNo results for input(s): TROPONINI in the last 168 hours. No results for input(s): TROPIPOC in the last 168 hours.   BNPNo results for input(s): BNP, PROBNP in the last 168 hours.   DDimer No results for input(s): DDIMER in the last 168 hours.   Radiology    No results found.  Cardiac Studies   Echocardiogram: 08/22/2018 Study Conclusions   - Left ventricle: The cavity size was normal. Wall thickness was  increased in a pattern of moderate LVH. The estimated ejection  fraction was 45%. There is hypokinesis of the midinferoseptal  myocardium. The study was not  technically sufficient to allow  evaluation of LV diastolic dysfunction due to atrial  fibrillation.  - Aortic valve: Mildly calcified annulus. Trileaflet; mildly  calcified leaflets.  - Mitral valve: Mildly calcified annulus. There was mild  regurgitation.  - Left atrium: The atrium was moderately dilated.  - Right atrium: The atrium was mildly to moderately dilated.  - Atrial septum: No defect or patent foramen ovale was identified.  - Tricuspid valve: There was mild regurgitation.  - Pulmonary arteries: PA peak pressure: 21 mm Hg (S).  - Pericardium, extracardiac: There was no pericardial effusion.   Patient Profile     79 y.o. female with past medical history of permanent atrial fibrillation (on Coumadin for anticoagulation), chronic combined systolic and diastolic CHF (EF 84% by echo in 08/2018), HTN and ESRD who is being seen today for the evaluation of atrial fibrillation with RVR at the request of Dr. Denton Brick.   Assessment & Plan    1.  Atrial fibrillation with rapid ventricular response: She has a history of permanent atrial fibrillation and is currently being treated for pneumonia and sepsis.  IV diltiazem led to significant hypotension and she also had worsening hypotension during hemodialysis.  She is now on IV amiodarone and IV metoprolol.  She did require an esmolol drip during a prior admission so I would consider this if her rates remain elevated.  I suspect they will improve with treatment of underlying illness as heart rates are being driven by physiologic stressors.  I will increase IV metoprolol to 5 mg every 6 hours.  I spoke to her nurse prior to dialysis and asked her to give 5 mg now.  Once she improves clinically we can transition to her back to an oral regimen. She remains on warfarin for anticoagulation with pharmacy assistance for dosing.  2.  Chronic combined systolic and diastolic heart failure: LVEF 45% by echocardiogram in January 2020.  She does not  appear volume overloaded and volume status is being managed by hemodialysis.  I would recommend a follow-up echocardiogram once heart rates are improved to assess for interval changes in cardiac structure and function.  3.  Hypertension: Blood pressure is normal.  I will continue to monitor and adjust medications as needed.  4.  End-stage renal disease: On hemodialysis.  She will be dialyzed today and is on a Monday, Wednesday, Friday schedule.  5.  Sepsis in the setting of pneumonia: Management per hospitalist team.      For questions or updates, please contact [facility] Please consult [email or web address] for contact info under Cardiology/STEMI.      Signed, Kate Sable, MD  09/24/2019, 9:26 AM

## 2019-09-24 NOTE — Progress Notes (Signed)
ANTICOAGULATION CONSULT NOTE -  Pharmacy Consult for warfarin Indication: atrial fibrillation  No Known Allergies  Patient Measurements: Height: 5\' 4"  (162.6 cm) Weight: 220 lb 3.8 oz (99.9 kg) IBW/kg (Calculated) : 54.7   Vital Signs: Temp: 97.5 F (36.4 C) (02/17 0747) Temp Source: Oral (02/17 0747) BP: 132/89 (02/17 0600) Pulse Rate: 132 (02/17 0747)  Labs: Recent Labs    09/22/19 0530 09/23/19 0535 09/24/19 0402  HGB 11.6* 10.7*  --   HCT 35.7* 32.9*  --   PLT 171 155  --   LABPROT 23.6* 24.1* 20.8*  INR 2.1* 2.2* 1.8*  CREATININE 12.94* 8.79* 11.04*    Estimated Creatinine Clearance: 4.7 mL/min (A) (by C-G formula based on SCr of 11.04 mg/dL (H)).   Medical History: Past Medical History:  Diagnosis Date  . Anemia   . Atrial fibrillation ([place])   . Chest pain 05/15/2006   Stress test negative for ischemia  . Diastolic heart failure ([place])   . Diverticulosis   . ESRD on hemodialysis ([place])   . GERD (gastroesophageal reflux disease)   . Headache   . Mixed hyperlipidemia   . Peripheral neuropathy   . PONV (postoperative nausea and vomiting)   . Systemic hypertension   . Uterine cancer (HCC)     Medications:  Medications Prior to Admission  Medication Sig Dispense Refill Last Dose  . acetaminophen (TYLENOL) 500 MG tablet Take 1,000 mg by mouth every 6 (six) hours as needed for moderate pain.    Past Week at Unknown time  . albuterol (VENTOLIN HFA) 108 (90 Base) MCG/ACT inhaler Inhale 2 puffs into the lungs every 4 (four) hours as needed for wheezing or shortness of breath (cough, shortness of breath or wheezing.). 18 g 1 09/22/2019 at Unknown time  . Cholecalciferol (VITAMIN D) 2000 UNITS CAPS Take 2,000 Units by mouth every evening.    09/21/2019 at Unknown time  . fluticasone (FLOVENT HFA) 44 MCG/ACT inhaler Inhale 2 puffs into the lungs 2 (two) times daily. 1 Inhaler 2 Past Month at Unknown time  . gabapentin (NEURONTIN) 100 MG capsule Take 100 mg by mouth  2 (two) times daily.    09/21/2019 at Unknown time  . metoprolol tartrate (LOPRESSOR) 25 MG tablet Take 1 tablet (25 mg total) by mouth 2 (two) times daily. 60 tablet 1 09/21/2019 at 1830  . Multiple Vitamins-Minerals (PRESERVISION AREDS 2 PO) Take 1 tablet by mouth 2 (two) times daily.   09/21/2019 at Unknown time  . multivitamin (RENA-VIT) TABS tablet Take 1 tablet by mouth every evening.    09/21/2019 at Unknown time  . Omega-3 Fatty Acids (OMEGA-3 FISH OIL) 300 MG CAPS Take 300 mg by mouth 2 (two) times daily.   09/21/2019 at Unknown time  . omeprazole (PRILOSEC) 20 MG capsule Take 20 mg by mouth every morning.    09/21/2019 at Unknown time  . Propylene Glycol (SYSTANE BALANCE) 0.6 % SOLN Place 1 drop into both eyes 3 (three) times daily as needed (for burning or dry eyes).    Past Month at Unknown time  . RENVELA 800 MG tablet Take 2,400 mg by mouth 3 (three) times daily with meals. Takes 1 tablet with snacks   09/21/2019 at Unknown time  . warfarin (COUMADIN) 5 MG tablet TAKE 1 & 1/2 TABLETS BY MOUTH (ONE & ONE-HALF) TO 2 TABLETS ONCE DAILY AS DIRECTED BY COUMADIN CLINIC (Patient taking differently: Take 7.5-10 mg by mouth daily at 6 PM. TAKE 2 TABLET BY MOUTH ON Monday  AND 1 1/2 TABLETS BY MOUTH (ONE & ONE-HALF) ON Tuesday, Wednesday, Thursday,  Friday, Saturday, Sunday.) 150 tablet 0 09/21/2019 at 1830  . predniSONE (STERAPRED UNI-PAK 21 TAB) 10 MG (21) TBPK tablet Take by mouth daily. Take 6 tabs by mouth daily  for 2 days, then 5 tabs for 2 days, then 4 tabs for 2 days, then 3 tabs for 2 days, 2 tabs for 2 days, then 1 tab by mouth daily for 2 days (Patient not taking: Reported on 09/22/2019) 42 tablet 0 Not Taking at Unknown time    Assessment: Pharmacy consulted to dose warfarin in patient with atrial fibrillation.  Patient's INR on admission is 2.1 with last dose given 2/14 1830.  Coumadin 10mg  dose ordered 2/15 not given. Patient now started on amiodarone, will have to be careful with dosing  coumadin d/t drug interaction. INR is subtherapeutic today at 1.8. Will give usual dose again today, since subtherapeutic but keeping in mind amiodarone started.   Home dose listed as 10 mg every Mon and 7.5 mg ROW per anticoag clinic.  Goal of Therapy:  INR 2-3 Monitor platelets by anticoagulation protocol: Yes   Plan:  Warfarin 7.5 mg x 1 dose. Monitor daily INR and s/s of bleeding.  Isac Sarna, BS Vena [place], BCPS Clinical Pharmacist Pager [phone] 09/24/2019,8:31 AM

## 2019-09-24 NOTE — Care Management Important Message (Signed)
Important Message  Patient Details  Name: Sabrina Beck MRN: [id number] Date of Birth: March 31, 1941   Medicare Important Message Given:  Yes     Tommy Medal 09/24/2019, 2:26 PM

## 2019-09-25 ENCOUNTER — Inpatient Hospital Stay (HOSPITAL_COMMUNITY): Payer: Medicare HMO

## 2019-09-25 LAB — BASIC METABOLIC PANEL
BUN: 42 mg/dL — ABNORMAL HIGH (ref 8–23)
CO2: 26 mmol/L (ref 22–32)
Calcium: 10 mg/dL (ref 8.9–10.3)
Chloride: 88 mmol/L — ABNORMAL LOW (ref 98–111)
Creatinine, Ser: 7.42 mg/dL — ABNORMAL HIGH (ref 0.44–1.00)
GFR calc Af Amer: 5 mL/min — ABNORMAL LOW (ref >60)
GFR calc non Af Amer: 5 mL/min — ABNORMAL LOW (ref >60)
Glucose, Bld: 122 mg/dL — ABNORMAL HIGH (ref 70–99)
Potassium: 4.4 mmol/L (ref 3.5–5.1)
Sodium: 136 mmol/L (ref 135–145)

## 2019-09-25 LAB — PROTIME-INR
INR: 2.1 — ABNORMAL HIGH (ref 0.8–1.2)
Prothrombin Time: 23.1 seconds — ABNORMAL HIGH (ref 11.4–15.2)

## 2019-09-25 MED ORDER — WARFARIN SODIUM 5 MG PO TABS
5.0000 mg | ORAL_TABLET | Freq: Once | ORAL | Status: AC
  Administered 2019-09-25: 5 mg via ORAL
  Filled 2019-09-25: qty 1

## 2019-09-25 MED ORDER — AMIODARONE HCL IN DEXTROSE 360-4.14 MG/200ML-% IV SOLN
30.0000 mg/h | INTRAVENOUS | Status: DC
  Administered 2019-09-25 – 2019-09-28 (×5): 30 mg/h via INTRAVENOUS
  Filled 2019-09-25 (×6): qty 200

## 2019-09-25 MED ORDER — AMIODARONE HCL IN DEXTROSE 360-4.14 MG/200ML-% IV SOLN: 30.0000 mg/h | INTRAVENOUS | Status: AC

## 2019-09-25 MED ORDER — ESMOLOL HCL-SODIUM CHLORIDE 2000 MG/100ML IV SOLN
25.0000 ug/kg/min | INTRAVENOUS | Status: DC
  Administered 2019-09-25 (×2): 25 ug/kg/min via INTRAVENOUS
  Filled 2019-09-25 (×2): qty 100

## 2019-09-25 NOTE — Progress Notes (Signed)
ANTICOAGULATION CONSULT NOTE -  Pharmacy Consult for warfarin Indication: atrial fibrillation  No Known Allergies  Patient Measurements: Height: 5\' 4"  (162.6 cm) Weight: 213 lb 10 oz (96.9 kg) IBW/kg (Calculated) : 54.7   Vital Signs: Temp: 97.8 F (36.6 C) (02/18 0800) Temp Source: Oral (02/18 0800) BP: 102/64 (02/18 0937) Pulse Rate: 107 (02/18 0937)  Labs: Recent Labs    09/23/19 0535 09/24/19 0402 09/25/19 0356  HGB 10.7*  --   --   HCT 32.9*  --   --   PLT 155  --   --   LABPROT 24.1* 20.8* 23.1*  INR 2.2* 1.8* 2.1*  CREATININE 8.79* 11.04* 7.42*    Estimated Creatinine Clearance: 6.8 mL/min (A) (by C-G formula based on SCr of 7.42 mg/dL (H)).   Medical History: Past Medical History:  Diagnosis Date  . Anemia   . Atrial fibrillation ([place])   . Chest pain 05/15/2006   Stress test negative for ischemia  . Diastolic heart failure ([place])   . Diverticulosis   . ESRD on hemodialysis ([place])   . GERD (gastroesophageal reflux disease)   . Headache   . Mixed hyperlipidemia   . Peripheral neuropathy   . PONV (postoperative nausea and vomiting)   . Systemic hypertension   . Uterine cancer ([facility])     Medications:  Medications Prior to Admission  Medication Sig Dispense Refill Last Dose  . acetaminophen (TYLENOL) 500 MG tablet Take 1,000 mg by mouth every 6 (six) hours as needed for moderate pain.    Past Week at Unknown time  . albuterol (VENTOLIN HFA) 108 (90 Base) MCG/ACT inhaler Inhale 2 puffs into the lungs every 4 (four) hours as needed for wheezing or shortness of breath (cough, shortness of breath or wheezing.). 18 g 1 09/22/2019 at Unknown time  . Cholecalciferol (VITAMIN D) 2000 UNITS CAPS Take 2,000 Units by mouth every evening.    09/21/2019 at Unknown time  . fluticasone (FLOVENT HFA) 44 MCG/ACT inhaler Inhale 2 puffs into the lungs 2 (two) times daily. 1 Inhaler 2 Past Month at Unknown time  . gabapentin (NEURONTIN) 100 MG capsule Take 100 mg by mouth 2  (two) times daily.    09/21/2019 at Unknown time  . metoprolol tartrate (LOPRESSOR) 25 MG tablet Take 1 tablet (25 mg total) by mouth 2 (two) times daily. 60 tablet 1 09/21/2019 at 1830  . Multiple Vitamins-Minerals (PRESERVISION AREDS 2 PO) Take 1 tablet by mouth 2 (two) times daily.   09/21/2019 at Unknown time  . multivitamin (RENA-VIT) TABS tablet Take 1 tablet by mouth every evening.    09/21/2019 at Unknown time  . Omega-3 Fatty Acids (OMEGA-3 FISH OIL) 300 MG CAPS Take 300 mg by mouth 2 (two) times daily.   09/21/2019 at Unknown time  . omeprazole (PRILOSEC) 20 MG capsule Take 20 mg by mouth every morning.    09/21/2019 at Unknown time  . Propylene Glycol (SYSTANE BALANCE) 0.6 % SOLN Place 1 drop into both eyes 3 (three) times daily as needed (for burning or dry eyes).    Past Month at Unknown time  . RENVELA 800 MG tablet Take 2,400 mg by mouth 3 (three) times daily with meals. Takes 1 tablet with snacks   09/21/2019 at Unknown time  . warfarin (COUMADIN) 5 MG tablet TAKE 1 & 1/2 TABLETS BY MOUTH (ONE & ONE-HALF) TO 2 TABLETS ONCE DAILY AS DIRECTED BY COUMADIN CLINIC (Patient taking differently: Take 7.5-10 mg by mouth daily at 6 PM. TAKE  2 TABLET BY MOUTH ON Monday AND 1 1/2 TABLETS BY MOUTH (ONE & ONE-HALF) ON Tuesday, Wednesday, Thursday,  Friday, Saturday, Sunday.) 150 tablet 0 09/21/2019 at 1830  . predniSONE (STERAPRED UNI-PAK 21 TAB) 10 MG (21) TBPK tablet Take by mouth daily. Take 6 tabs by mouth daily  for 2 days, then 5 tabs for 2 days, then 4 tabs for 2 days, then 3 tabs for 2 days, 2 tabs for 2 days, then 1 tab by mouth daily for 2 days (Patient not taking: Reported on 09/22/2019) 42 tablet 0 Not Taking at Unknown time    Assessment: Pharmacy consulted to dose warfarin in patient with atrial fibrillation.   Coumadin 10mg  dose ordered 2/15 not given. Patient now started on amiodarone, will have to be careful with dosing coumadin d/t drug interaction. INR is now therapeutic at 2.1.  Home  dose listed as 10 mg every Mon and 7.5 mg ROW per anticoag clinic.  Goal of Therapy:  INR 2-3 Monitor platelets by anticoagulation protocol: Yes   Plan:  Warfarin 5 mg x 1 dose. Lower dose due to new amiodarone Monitor daily INR and s/s of bleeding.  Margot Ables, PharmD Clinical Pharmacist 09/25/2019 11:19 AM

## 2019-09-25 NOTE — Progress Notes (Signed)
Patient Demographics:    Sabrina Beck, is a 79 y.o. female, DOB - [birth year], HLK:[id number]  Admit date - 09/22/2019   Admitting Physician Geraldyn Shain Denton Brick, MD  Outpatient Primary MD for the patient is Redmond School, MD  LOS - 3   Chief Complaint  Patient presents with  . Shortness of Breath        Subjective:    Sabrina Beck today has no fevers, no emesis,  No chest pain,   -Son at bedside, questions answered -Tachycardia and hypoxia persist  Assessment  & Plan :    Principal Problem:   PNA (pneumonia) Active Problems:   Atrial fibrillation ([place])   Long term current use of anticoagulant therapy   ESRD (end stage renal disease) (HCC)   Anemia   Sepsis (HCC)   Atrial fibrillation with rapid ventricular response (HCC)   Chronic systolic heart failure (HCC)   1)Bilateral community-acquired Pneumonia with hypoxic respiratory failure--- hypoxia is new this admission, persistent at this time continue IV Rocephin and azithromycin , bronchodilators and mucolytics as ordered  -Supplemental oxygen as ordered  2) chronic Afib --- with RVR, last known EF 45%, patient was treated with IV Cardizem became hypotensive Cardizem was discontinued, tachycardia reoccurred, IV Cardizem was tried again patient became hypotensive again -Continue IV amiodarone, and metoprolol--- rate control remains challenging -Cardiology consult appreciated,  - okay to start IV esmolol drip given persistent challenges with rate control -TSH is 4.3,  -Coumadin for anticoagulation,  INR 1.8  3) sepsis secondary to pneumonia in a hemodialysis patient--- unable to give required IV fluid boluses due to hemodialysis/ESRD status -Sepsis pathophysiology appears to be resolving, no fevers -Repeat chest x-ray with left-sided pneumonia suspect chest x-ray findings is lagging behind clinical improvement -Treat as above  #1  4)ESRD--nephrology consult for HD appreciated patient usually gets HD Monday Wednesdays and Fridays, she has not missed any HD sessions -underwent hemodialysis in ER with 2.3 liters UF with HD on 09/22/2019 limited by hypotension -Tolerated HD on 09/24/2019  5)-anemia of CKD--- hemoglobin is stable , EPO/ESA agent per nephrology team  6) acute hypoxic respiratory failure secondary to #1 #2 above--- currently requiring oxygen, hopefully hypoxia will improve with hemodialysis and treatment of pneumonia , as well as with better rate control as above #1  7)HFrEF--acute on chronic combined systolic and diastolic dysfunction CHF--- fluid balance is negative, weight appears not to be accurate, -Continue attempts at rate control, continue to use hemodialysis to address volume status, -Cardiology input appreciated  Disposition/Need for in-Hospital Stay- patient unable to be discharged at this time due to --sepsis secondary to pneumonia requiring IV antibiotics, A. fib with RVR requiring IV amiodarone and IV esmolol drip for rate control,  -Remains hypoxic with oxygen -Not medically ready for discharge home at this time  Code Status : full  Family Communication:  (patient is alert, awake and coherent) Attempted to call Son [name]-- [phone]--XBWI message-  -Discussed with son [name] at bedside  Consults  : Cardiology/nephrology  DVT Prophylaxis  : Coumadin- SCDs   Lab Results  Component Value Date   PLT 155 09/23/2019    Inpatient Medications  Scheduled Meds: . budesonide  0.5 mg Inhalation BID  . Chlorhexidine Gluconate Cloth  6 each Topical Q0600  .  Chlorhexidine Gluconate Cloth  6 each Topical Q0600  . cholecalciferol  2,000 Units Oral QPM  . gabapentin  100 mg Oral BID  . guaiFENesin  600 mg Oral BID  . levalbuterol  0.63 mg Nebulization TID  . multivitamin  1 tablet Oral QPM  . multivitamin-lutein  1 capsule Oral BID  . omega-3 acid ethyl esters  1,000 mg Oral BID  .  pantoprazole  40 mg Oral Daily  . sevelamer carbonate  2,400 mg Oral TID WC  . sodium chloride flush  3 mL Intravenous Q12H  . warfarin  5 mg Oral ONCE-1800  . Warfarin - Pharmacist Dosing Inpatient   Does not apply q1800   Continuous Infusions: . sodium chloride 250 mL (09/23/19 0820)  . sodium chloride    . sodium chloride    . amiodarone 30 mg/hr (09/25/19 1248)  . amiodarone    . azithromycin Stopped (09/25/19 1106)  . cefTRIAXone (ROCEPHIN)  IV Stopped (09/25/19 0959)  . esmolol 25 mcg/kg/min (09/25/19 1600)   PRN Meds:.sodium chloride, sodium chloride, sodium chloride, acetaminophen **OR** acetaminophen, guaiFENesin-dextromethorphan, lidocaine (PF), lidocaine-prilocaine, ondansetron (ZOFRAN) IV, ondansetron **OR** ondansetron (ZOFRAN) IV, pentafluoroprop-tetrafluoroeth, polyethylene glycol, polyvinyl alcohol, sodium chloride flush, traZODone    Anti-infectives (From admission, onward)   Start     Dose/Rate Route Frequency Ordered Stop   09/23/19 1100  vancomycin (VANCOCIN) IVPB 1000 mg/200 mL premix     1,000 mg 200 mL/hr over 60 Minutes Intravenous  Once 09/23/19 1049 09/23/19 1233   09/23/19 1000  azithromycin (ZITHROMAX) 500 mg in sodium chloride 0.9 % 250 mL IVPB     500 mg 250 mL/hr over 60 Minutes Intravenous Every 24 hours 09/22/19 1018     09/23/19 1000  cefTRIAXone (ROCEPHIN) 1 g in sodium chloride 0.9 % 100 mL IVPB     1 g 200 mL/hr over 30 Minutes Intravenous Every 24 hours 09/22/19 1018     09/22/19 1800  vancomycin (VANCOCIN) IVPB 1000 mg/200 mL premix  Status:  Discontinued     1,000 mg 200 mL/hr over 60 Minutes Intravenous Every M-W-F (Hemodialysis) 09/22/19 1300 09/23/19 1049   09/22/19 1030  cefTRIAXone (ROCEPHIN) 1 g in sodium chloride 0.9 % 100 mL IVPB  Status:  Discontinued     1 g 200 mL/hr over 30 Minutes Intravenous Every 24 hours 09/22/19 1015 09/22/19 1018   09/22/19 1030  azithromycin (ZITHROMAX) 500 mg in sodium chloride 0.9 % 250 mL IVPB  Status:   Discontinued     500 mg 250 mL/hr over 60 Minutes Intravenous Every 24 hours 09/22/19 1015 09/22/19 1018   09/22/19 0715  vancomycin (VANCOREADY) IVPB 1500 mg/300 mL     1,500 mg 150 mL/hr over 120 Minutes Intravenous  Once 09/22/19 0704 09/22/19 1600   09/22/19 0700  cefTRIAXone (ROCEPHIN) 1 g in sodium chloride 0.9 % 100 mL IVPB     1 g 200 mL/hr over 30 Minutes Intravenous  Once 09/22/19 0645 09/22/19 0904   09/22/19 0700  azithromycin (ZITHROMAX) 500 mg in sodium chloride 0.9 % 250 mL IVPB     500 mg 250 mL/hr over 60 Minutes Intravenous  Once 09/22/19 0645 09/22/19 1017        Objective:   Vitals:   09/25/19 1430 09/25/19 1456 09/25/19 1535 09/25/19 1600  BP: (!) 99/54  92/77 (!) 90/57  Pulse: (!) 44  (!) 108 (!) 105  Resp: (!) 23  (!) 23 (!) 26  Temp:      TempSrc:  SpO2: 91% 92% 94% 93%  Weight:      Height:        Wt Readings from Last 3 Encounters:  09/25/19 96.9 kg  06/05/19 99.8 kg  05/06/19 98.9 kg     Intake/Output Summary (Last 24 hours) at 09/25/2019 1731 Last data filed at 09/25/2019 1600 Gross per 24 hour  Intake 1032.21 ml  Output --  Net 1032.21 ml     Physical Exam  Gen:- Awake Alert, in no acute distress HEENT:- [place].AT, No sclera icterus Nose- [place] 5L/min Neck-Supple Neck,No JVD,.  Lungs-diminished in bases, no wheezing CV- S1, S2 normal, irregularly irregular heart rate in the 130s abd-  +ve B.Sounds, Abd Soft, No tenderness,    Extremity/Skin:-, pedal pulses present  Psych-affect is appropriate, oriented x3 Neuro-generalized weakness, no new focal deficits, no tremors   Data Review:   Micro Results Recent Results (from the past 240 hour(s))  Respiratory Panel by RT PCR (Flu A&B, Covid) - Nasopharyngeal Swab     Status: None   Collection Time: 09/22/19  5:37 AM   Specimen: Nasopharyngeal Swab  Result Value Ref Range Status   SARS Coronavirus 2 by RT PCR NEGATIVE NEGATIVE Final    Comment: (NOTE) SARS-CoV-2 target nucleic acids  are NOT DETECTED. The SARS-CoV-2 RNA is generally detectable in upper respiratoy specimens during the acute phase of infection. The lowest concentration of SARS-CoV-2 viral copies this assay can detect is 131 copies/mL. A negative result does not preclude SARS-Cov-2 infection and should not be used as the sole basis for treatment or other patient management decisions. A negative result may occur with  improper specimen collection/handling, submission of specimen other than nasopharyngeal swab, presence of viral mutation(s) within the areas targeted by this assay, and inadequate number of viral copies (<131 copies/mL). A negative result must be combined with clinical observations, patient history, and epidemiological information. The expected result is Negative. Fact Sheet for Patients:  PinkCheek.be Fact Sheet for Healthcare Providers:  GravelBags.it This test is not yet ap proved or cleared by the [place] FDA and  has been authorized for detection and/or diagnosis of SARS-CoV-2 by FDA under an Emergency Use Authorization (EUA). This EUA will remain  in effect (meaning this test can be used) for the duration of the COVID-19 declaration under Section 564(b)(1) of the Act, 21 U.S.C. section 360bbb-3(b)(1), unless the authorization is terminated or revoked sooner.    Influenza A by PCR NEGATIVE NEGATIVE Final   Influenza B by PCR NEGATIVE NEGATIVE Final    Comment: (NOTE) The Xpert Xpress SARS-CoV-2/FLU/RSV assay is intended as an aid in  the diagnosis of influenza from Nasopharyngeal swab specimens and  should not be used as a sole basis for treatment. Nasal washings and  aspirates are unacceptable for Xpert Xpress SARS-CoV-2/FLU/RSV  testing. Fact Sheet for Patients: PinkCheek.be Fact Sheet for Healthcare Providers: GravelBags.it This test is not yet approved or  cleared by the [place] FDA and  has been authorized for detection and/or diagnosis of SARS-CoV-2 by  FDA under an Emergency Use Authorization (EUA). This EUA will remain  in effect (meaning this test can be used) for the duration of the  Covid-19 declaration under Section 564(b)(1) of the Act, 21  U.S.C. section 360bbb-3(b)(1), unless the authorization is  terminated or revoked. Performed at [facility], [address], [place], [place] [address]   Blood culture (routine x 2)     Status: None (Preliminary result)   Collection Time: 09/22/19  6:32 AM  Specimen: BLOOD  Result Value Ref Range Status   Specimen Description BLOOD LEFT HAND  Final   Special Requests   Final    BOTTLES DRAWN AEROBIC AND ANAEROBIC Blood Culture adequate volume   Culture   Final    NO GROWTH 3 DAYS Performed at [facility], [address] [place], [place] [address]    Report Status PENDING  Incomplete  Blood culture (routine x 2)     Status: None (Preliminary result)   Collection Time: 09/22/19  6:39 AM   Specimen: BLOOD  Result Value Ref Range Status   Specimen Description BLOOD LEFT HAND  Final   Special Requests   Final    BOTTLES DRAWN AEROBIC AND ANAEROBIC Blood Culture adequate volume   Culture   Final    NO GROWTH 3 DAYS Performed at [facility], [address] [place], [place] [address]    Report Status PENDING  Incomplete  MRSA PCR Screening     Status: None   Collection Time: 09/22/19  8:45 PM   Specimen: Nasal Mucosa; Nasopharyngeal  Result Value Ref Range Status   MRSA by PCR NEGATIVE NEGATIVE Final    Comment:        The GeneXpert MRSA Assay (FDA approved for NASAL specimens only), is one component of a comprehensive MRSA colonization surveillance program. It is not intended to diagnose MRSA infection nor to guide or monitor treatment for MRSA infections. Performed at [facility], [address] [place],  [address]     Radiology Reports DG Chest [place] 1  View  Result Date: 09/25/2019 CLINICAL DATA:  79 year old with shortness of breath. Follow-up pneumonia. Current history of end-stage renal disease on hemodialysis. Negative COVID-19 test on 09/22/2019. EXAM: PORTABLE CHEST 1 VIEW COMPARISON:  09/22/2019 and earlier, including CT chest 04/20/2019. FINDINGS: Cardiac silhouette moderately to markedly enlarged, unchanged. Airspace opacities throughout both lungs, most confluent in the LEFT LOWER LOBE, worse than on the examination 3 days ago. IMPRESSION: Worsening bilateral airspace opacities, most confluent in the LEFT LOWER LOBE. Pneumonia is favored over pulmonary edema. Electronically Signed   By: Evangeline Dakin M.D.   On: 09/25/2019 10:18   DG Chest Port 1 View  Result Date: 09/22/2019 CLINICAL DATA:  Shortness of breath EXAM: PORTABLE CHEST 1 VIEW COMPARISON:  04/20/2019 FINDINGS: Patchy bilateral airspace opacity most dense in the peripheral left lung. Cardiomegaly. No effusion or pneumothorax. IMPRESSION: 1. Patchy bilateral pneumonia. 2. Chronic cardiomegaly. Electronically Signed   By: Monte Fantasia M.D.   On: 09/22/2019 05:55     CBC Recent Labs  Lab 09/22/19 0530 09/23/19 0535  WBC 11.0* 9.9  HGB 11.6* 10.7*  HCT 35.7* 32.9*  PLT 171 155  MCV 114.4* 113.4*  MCH 37.2* 36.9*  MCHC 32.5 32.5  RDW 14.7 14.8  LYMPHSABS 2.4  --   MONOABS 0.9  --   EOSABS 0.1  --   BASOSABS 0.1  --     Chemistries  Recent Labs  Lab 09/22/19 0530 09/22/19 1859 09/23/19 0535 09/24/19 0402 09/25/19 0356  NA 137  --  136 137 136  K 4.7  --  4.5 5.1 4.4  CL 96*  --  91* 91* 88*  CO2 21*  --  25 24 26   GLUCOSE 165*  --  134* 138* 122*  BUN 62*  --  40* 63* 42*  CREATININE 12.94*  --  8.79* 11.04* 7.42*  CALCIUM 10.0  --  9.6 9.8 10.0  MG  --  1.8  1.9  --   --   AST  --   --  21  --   --   ALT  --   --  21  --   --   ALKPHOS  --   --  63  --   --   BILITOT  --   --  1.0  --   --     ------------------------------------------------------------------------------------------------------------------ No results for input(s): CHOL, HDL, LDLCALC, TRIG, CHOLHDL, LDLDIRECT in the last 72 hours.  No results found for: HGBA1C ------------------------------------------------------------------------------------------------------------------ Recent Labs    09/22/19 1859  TSH 4.301   ------------------------------------------------------------------------------------------------------------------ No results for input(s): VITAMINB12, FOLATE, FERRITIN, TIBC, IRON, RETICCTPCT in the last 72 hours.  Coagulation profile Recent Labs  Lab 09/22/19 0530 09/23/19 0535 09/24/19 0402 09/25/19 0356  INR 2.1* 2.2* 1.8* 2.1*    No results for input(s): DDIMER in the last 72 hours.  Cardiac Enzymes No results for input(s): CKMB, TROPONINI, MYOGLOBIN in the last 168 hours.  Invalid input(s): CK ------------------------------------------------------------------------------------------------------------------    Component Value Date/Time   BNP 344.0 (H) 04/08/2019 1600     Roxan Hockey M.D on 09/25/2019 at 5:31 PM  Go to [email or web address] - for contact info  [facility] - Office  [phone]

## 2019-09-25 NOTE — Progress Notes (Signed)
Pt BP softened, SBP <100 with esmolol gtt starting. [place] PA made aware. Advised she will call me back shortly. Will continue to monitor.

## 2019-09-25 NOTE — Progress Notes (Signed)
[facility] Progress Note  Name: Sabrina Beck MRN: [id number] DOB: 07-14-1941  Chief Complaint:  Shortness of breath   Subjective:   On 3 L nasal cannula HD yesterday, 3.2 L ultrafiltration Breathing somewhat improved Continues in RVR, heart rate 100-120s  ------ Background on consult:  Sabrina Beck is a 79 y.o. female with a history of end-stage renal disease, chronic diastolic CHF, anemia, and atrial fibrillation who presented to the hospital with shortness of breath.  She was found to have patchy bilateral pneumonia on chest x-ray.  Course here has also been complicated by A fib with RVR and she was ordered a diltiazem infusion.  She has been wheezing in the ER and has required supplemental oxygen at 3.5 - 4 L most recently.  She states that she noticed her shortness of breath when she woke up today.  I have discussed with the dialysis nurse to let her know the patient is here and to anticipate orders for dialysis soon.  She normally dialyzes Monday Wednesday Friday [facility] [address]  States that she did go to dialysis on Friday and stayed her full treatment and normally does not have issues with tolerating dialysis.   She denies any nausea or vomiting or dizziness.  Denies any subjective fever or cough.  Just had a breathing treatment - still short of breath.  Thinks that she may carry fluid around her abdomen and lungs.     Intake/Output Summary (Last 24 hours) at 09/25/2019 1041 Last data filed at 09/25/2019 0956 Gross per 24 hour  Intake 1552.06 ml  Output 3254 ml  Net -1701.94 ml    Vitals:  Vitals:   09/25/19 0800 09/25/19 0821 09/25/19 0825 09/25/19 0937  BP: 119/78   102/64  Pulse: (!) 116   (!) 107  Resp: (!) 21   (!) 25  Temp: 97.8 F (36.6 C)     TempSrc: Oral     SpO2: 96% 96% 98% 98%  Weight:      Height:         Physical Exam:    NAD, lying in bed Mild increased work of breathing with conversation, diminished in  bases Tachycardic, irregular, no rub Left arm AV fistula with bruit and thrill No significant lower extremity edema Soft, nontender, obese Nonfocal, CN II through XII grossly intact  Medications reviewed    Labs:  BMP Latest Ref Rng & Units 09/25/2019 09/24/2019 09/23/2019  Glucose 70 - 99 mg/dL 122(H) 138(H) 134(H)  BUN 8 - 23 mg/dL 42(H) 63(H) 40(H)  Creatinine 0.44 - 1.00 mg/dL 7.42(H) 11.04(H) 8.79(H)  BUN/Creat Ratio 11 - 26 - - -  Sodium 135 - 145 mmol/L 136 137 136  Potassium 3.5 - 5.1 mmol/L 4.4 5.1 4.5  Chloride 98 - 111 mmol/L 88(L) 91(L) 91(L)  CO2 22 - 32 mmol/L 26 24 25   Calcium 8.9 - 10.3 mg/dL 10.0 9.8 9.6     Assessment/Plan:   # End-stage renal disease on hemodialysis - MWF [facility] using LUE AVF - Cont HD on schedule, UF as able, next treatment tomorrow, 3 L UF, AV fistula, no heparin, 2K bath  # Acute hypoxic respiratory failure -Treatment for pneumonia per primary team  - CTX and Azithro -Supplemental O2 as needed - manage volume with HD   # Bilateral pneumonia - healthcare associated - Therapies per primary team -  On azithromycin ceftriaxone and Vanc Blood cultures no growth to date, COVID-19 and influenza negative  # Chronic  combined systolic and diastolic CHF - optimize volume status with HD   # A. fib with RVR - Cardiology following, on warfarin  # Anemia CKD - No acute indication for ESA   # Secondary hyperpara/bone mineral metabolism -Continue binders, outpatient management  Rexene Agent, MD 09/25/2019  10:41 AM

## 2019-09-25 NOTE — Progress Notes (Addendum)
Progress Note  Patient Name: Sabrina Beck Date of Encounter: 09/25/2019  Primary Cardiologist: Sanda Klein, MD   Subjective   Breathing improving. She is unaware of her elevated HR. Denies any chest pain or palpitations.   Inpatient Medications    Scheduled Meds: . budesonide  0.5 mg Inhalation BID  . Chlorhexidine Gluconate Cloth  6 each Topical Q0600  . Chlorhexidine Gluconate Cloth  6 each Topical Q0600  . cholecalciferol  2,000 Units Oral QPM  . gabapentin  100 mg Oral BID  . guaiFENesin  600 mg Oral BID  . levalbuterol  0.63 mg Nebulization TID  . metoprolol tartrate  5 mg Intravenous Q6H  . multivitamin  1 tablet Oral QPM  . multivitamin-lutein  1 capsule Oral BID  . omega-3 acid ethyl esters  1,000 mg Oral BID  . pantoprazole  40 mg Oral Daily  . sevelamer carbonate  2,400 mg Oral TID WC  . sodium chloride flush  3 mL Intravenous Q12H  . Warfarin - Pharmacist Dosing Inpatient   Does not apply q1800   Continuous Infusions: . sodium chloride 250 mL (09/23/19 0820)  . sodium chloride    . sodium chloride    . amiodarone 30 mg/hr (09/25/19 0800)  . azithromycin 500 mg (09/24/19 1400)  . cefTRIAXone (ROCEPHIN)  IV 1 g (09/24/19 1445)   PRN Meds: sodium chloride, sodium chloride, sodium chloride, acetaminophen **OR** acetaminophen, guaiFENesin-dextromethorphan, lidocaine (PF), lidocaine-prilocaine, ondansetron (ZOFRAN) IV, ondansetron **OR** ondansetron (ZOFRAN) IV, pentafluoroprop-tetrafluoroeth, polyethylene glycol, polyvinyl alcohol, sodium chloride flush, traZODone   Vital Signs    Vitals:   09/25/19 0700 09/25/19 0800 09/25/19 0821 09/25/19 0825  BP: 118/77 119/78    Pulse: (!) 110 (!) 116    Resp: (!) 24 (!) 21    Temp:  97.8 F (36.6 C)    TempSrc:  Oral    SpO2: 95% 96% 96% 98%  Weight:      Height:        Intake/Output Summary (Last 24 hours) at 09/25/2019 0858 Last data filed at 09/25/2019 0800 Gross per 24 hour  Intake 1486.64 ml  Output  3254 ml  Net -1767.36 ml    Last 3 Weights 09/25/2019 09/24/2019 09/23/2019  Weight (lbs) 213 lb 10 oz 220 lb 3.8 oz 218 lb 14.7 oz  Weight (kg) 96.9 kg 99.9 kg 99.3 kg     [facility] LPN was present throughout the entirety of the encounter.  Telemetry    Atrial fibrillation, HR in 100's to 110's.  - Personally Reviewed  ECG    No new tracings.   Physical Exam   General: Well developed, well nourished, female appearing in no acute distress. Head: Normocephalic, atraumatic.  Neck: Supple without bruits, JVD not elevated. Lungs:  Resp regular and unlabored, expiratory wheezing along upper lung fields. Heart: Irregularly irregular, S1, S2, no S3, S4, or murmur; no rub. Abdomen: Soft, non-tender, non-distended with normoactive bowel sounds. No hepatomegaly. No rebound/guarding. No obvious abdominal masses. Extremities: No clubbing, cyanosis, or lower extremity edema. Distal pedal pulses are 2+ bilaterally. Neuro: Alert and oriented X 3. Moves all extremities spontaneously. Psych: Normal affect.  Labs    Chemistry Recent Labs  Lab 09/22/19 0530 09/22/19 0530 09/23/19 0535 09/24/19 0402 09/25/19 0356  NA 137   < > 136 137 136  K 4.7   < > 4.5 5.1 4.4  CL 96*   < > 91* 91* 88*  CO2 21*   < > 25 24 26  GLUCOSE 165*   < > 134* 138* 122*  BUN 62*   < > 40* 63* 42*  CREATININE 12.94*   < > 8.79* 11.04* 7.42*  CALCIUM 10.0   < > 9.6 9.8 10.0  PROT  --   --  6.4*  --   --   ALBUMIN  --   --  3.1*  --   --   AST  --   --  21  --   --   ALT  --   --  21  --   --   ALKPHOS  --   --  63  --   --   BILITOT  --   --  1.0  --   --   GFRNONAA 2*   < > 4* 3* 5*  GFRAA 3*   < > 4* 3* 5*  ANIONGAP 20*  --  20*  --   --    < > = values in this interval not displayed.     Hematology Recent Labs  Lab 09/22/19 0530 09/23/19 0535  WBC 11.0* 9.9  RBC 3.12* 2.90*  HGB 11.6* 10.7*  HCT 35.7* 32.9*  MCV 114.4* 113.4*  MCH 37.2* 36.9*  MCHC 32.5 32.5  RDW 14.7 14.8  PLT 171 155      Cardiac EnzymesNo results for input(s): TROPONINI in the last 168 hours. No results for input(s): TROPIPOC in the last 168 hours.   BNPNo results for input(s): BNP, PROBNP in the last 168 hours.   DDimer No results for input(s): DDIMER in the last 168 hours.   Radiology    No results found.  Cardiac Studies   Echocardiogram: 08/22/2018 Study Conclusions   - Left ventricle: The cavity size was normal. Wall thickness was  increased in a pattern of moderate LVH. The estimated ejection  fraction was 45%. There is hypokinesis of the midinferoseptal  myocardium. The study was not technically sufficient to allow  evaluation of LV diastolic dysfunction due to atrial  fibrillation.  - Aortic valve: Mildly calcified annulus. Trileaflet; mildly  calcified leaflets.  - Mitral valve: Mildly calcified annulus. There was mild  regurgitation.  - Left atrium: The atrium was moderately dilated.  - Right atrium: The atrium was mildly to moderately dilated.  - Atrial septum: No defect or patent foramen ovale was identified.  - Tricuspid valve: There was mild regurgitation.  - Pulmonary arteries: PA peak pressure: 21 mm Hg (S).  - Pericardium, extracardiac: There was no pericardial effusion.   Patient Profile     79 y.o. female w/ PMH of permanent atrial fibrillation (on Coumadin for anticoagulation), chronic combined systolic and diastolic CHF (EF 25% by echo in 08/2018), HTN and ESRDwho is currently admitted with PNA. Cardiology consulted for atrial fibrillation with RVR.   Assessment & Plan    1. Atrial Fibrillation with RVR - intolerant to IV Cardizem secondary to hypotension and is now on IV Amiodarone 30 mg/hr and IV Lopressor 5mg  IV Q6H with HR in the low-100's to 110's overnight, elevated into the 120's while eating breakfast. Not a candidate for Digoxin due to ESRD and Cardizem being avoided given her low-normal EF and hypotension. Will review with Dr. Bronson Ing in  regards to continuing current regimen versus switching to Esmolol drip.   - remains on Coumadin for anticoagulation. Appreciate Pharmacy's assistance with dosing.   2. Chronic Combined Systolic and Diastolic CHF  - EF 36% by echo in 08/2018. Given that resting rates have improved,  will plan to obtain a repeat echocardiogram today to assess LV function and wall motion as she is at risk for tachycardia-induced cardiomyopathy. - volume management per HD.   3. HTN - Hypotensive this admission, most notable during HD sessions. BP at 93/27 - 146/123 in the past 24 hours.   4. ESRD - on HD. MWF schedule. UF stopped early yesterday due to hypotension secondary to hypotension with 3.2L of fluid removed (goal of 4L).   5. Sepsis in the setting of PNA - remains on Rocephin and Azithromycin for PNA. WBC improving, at 9.9 on most recent check.    For questions or updates, please contact [place] Please consult [email or web address] for contact info under Cardiology/STEMI.   Signed, Erma Heritage , PA-C 8:58 AM 09/25/2019 Pager: [phone]  The patient was seen and examined, and I agree with the history, physical exam, assessment and plan as documented above, with modifications made above and as noted below.  The patient says her breathing has improved.  Heart rates remain in the 100-110 bpm range but quickly accelerated to the 120 bpm range with activity.  I will discontinue IV amiodarone and will start IV esmolol as this helped to control heart rates during a prior admission.  Heart rate should improve with continued treatment of underlying illness. She remains on warfarin for anticoagulation with pharmacy assistance for dosing.   Kate Sable, MD, [facility]  09/25/2019 11:22 AM

## 2019-09-26 ENCOUNTER — Inpatient Hospital Stay (HOSPITAL_COMMUNITY): Payer: Medicare HMO

## 2019-09-26 DIAGNOSIS — D631 Anemia in chronic kidney disease: Secondary | ICD-10-CM

## 2019-09-26 DIAGNOSIS — I959 Hypotension, unspecified: Secondary | ICD-10-CM

## 2019-09-26 LAB — RENAL FUNCTION PANEL
Albumin: 3.1 g/dL — ABNORMAL LOW (ref 3.5–5.0)
BUN: 66 mg/dL — ABNORMAL HIGH (ref 8–23)
CO2: 25 mmol/L (ref 22–32)
Calcium: 9.6 mg/dL (ref 8.9–10.3)
Chloride: 89 mmol/L — ABNORMAL LOW (ref 98–111)
Creatinine, Ser: 9.62 mg/dL — ABNORMAL HIGH (ref 0.44–1.00)
GFR calc Af Amer: 4 mL/min — ABNORMAL LOW (ref >60)
GFR calc non Af Amer: 3 mL/min — ABNORMAL LOW (ref >60)
Glucose, Bld: 112 mg/dL — ABNORMAL HIGH (ref 70–99)
Phosphorus: 8.8 mg/dL — ABNORMAL HIGH (ref 2.5–4.6)
Potassium: 4.5 mmol/L (ref 3.5–5.1)
Sodium: 136 mmol/L (ref 135–145)

## 2019-09-26 LAB — CBC
HCT: 31.9 % — ABNORMAL LOW (ref 36.0–46.0)
Hemoglobin: 10.4 g/dL — ABNORMAL LOW (ref 12.0–15.0)
MCH: 37.3 pg — ABNORMAL HIGH (ref 26.0–34.0)
MCHC: 32.6 g/dL (ref 30.0–36.0)
MCV: 114.3 fL — ABNORMAL HIGH (ref 80.0–100.0)
Platelets: 171 10*3/uL (ref 150–400)
RBC: 2.79 MIL/uL — ABNORMAL LOW (ref 3.87–5.11)
RDW: 14.5 % (ref 11.5–15.5)
WBC: 7.9 10*3/uL (ref 4.0–10.5)
nRBC: 0 % (ref 0.0–0.2)

## 2019-09-26 LAB — PROTIME-INR
INR: 2.5 — ABNORMAL HIGH (ref 0.8–1.2)
Prothrombin Time: 26.8 seconds — ABNORMAL HIGH (ref 11.4–15.2)

## 2019-09-26 MED ORDER — METOPROLOL TARTRATE 25 MG PO TABS
12.5000 mg | ORAL_TABLET | Freq: Two times a day (BID) | ORAL | Status: DC
  Administered 2019-09-26 (×2): 12.5 mg via ORAL
  Filled 2019-09-26 (×2): qty 1

## 2019-09-26 MED ORDER — WARFARIN SODIUM 5 MG PO TABS
5.0000 mg | ORAL_TABLET | Freq: Once | ORAL | Status: AC
  Administered 2019-09-26: 5 mg via ORAL
  Filled 2019-09-26: qty 1

## 2019-09-26 MED ORDER — ALBUMIN HUMAN 25 % IV SOLN: 25.0000 g | Freq: Once | INTRAVENOUS | Status: DC

## 2019-09-26 MED ORDER — MIDODRINE HCL 5 MG PO TABS
5.0000 mg | ORAL_TABLET | Freq: Three times a day (TID) | ORAL | Status: DC
  Administered 2019-09-26 – 2019-09-30 (×13): 5 mg via ORAL
  Filled 2019-09-26 (×13): qty 1

## 2019-09-26 NOTE — Progress Notes (Signed)
ANTICOAGULATION CONSULT NOTE -  Pharmacy Consult for warfarin Indication: atrial fibrillation  No Known Allergies  Patient Measurements: Height: 5\' 4"  (162.6 cm) Weight: 216 lb 7.9 oz (98.2 kg) IBW/kg (Calculated) : 54.7   Vital Signs: Temp: 97.9 F (36.6 C) (02/19 0400) Temp Source: Oral (02/19 0400) BP: 109/82 (02/19 0845) Pulse Rate: 100 (02/19 0845)  Labs: Recent Labs    09/24/19 0402 09/25/19 0356 09/26/19 0405  HGB  --   --  10.4*  HCT  --   --  31.9*  PLT  --   --  171  LABPROT 20.8* 23.1* 26.8*  INR 1.8* 2.1* 2.5*  CREATININE 11.04* 7.42* 9.62*    Estimated Creatinine Clearance: 5.3 mL/min (A) (by C-G formula based on SCr of 9.62 mg/dL (H)).   Medical History: Past Medical History:  Diagnosis Date  . Anemia   . Atrial fibrillation ([place])   . Chest pain 05/15/2006   Stress test negative for ischemia  . Diastolic heart failure ([place])   . Diverticulosis   . ESRD on hemodialysis ([place])   . GERD (gastroesophageal reflux disease)   . Headache   . Mixed hyperlipidemia   . Peripheral neuropathy   . PONV (postoperative nausea and vomiting)   . Systemic hypertension   . Uterine cancer ([facility])     Medications:  Medications Prior to Admission  Medication Sig Dispense Refill Last Dose  . acetaminophen (TYLENOL) 500 MG tablet Take 1,000 mg by mouth every 6 (six) hours as needed for moderate pain.    Past Week at Unknown time  . albuterol (VENTOLIN HFA) 108 (90 Base) MCG/ACT inhaler Inhale 2 puffs into the lungs every 4 (four) hours as needed for wheezing or shortness of breath (cough, shortness of breath or wheezing.). 18 g 1 09/22/2019 at Unknown time  . Cholecalciferol (VITAMIN D) 2000 UNITS CAPS Take 2,000 Units by mouth every evening.    09/21/2019 at Unknown time  . fluticasone (FLOVENT HFA) 44 MCG/ACT inhaler Inhale 2 puffs into the lungs 2 (two) times daily. 1 Inhaler 2 Past Month at Unknown time  . gabapentin (NEURONTIN) 100 MG capsule Take 100 mg by mouth 2  (two) times daily.    09/21/2019 at Unknown time  . metoprolol tartrate (LOPRESSOR) 25 MG tablet Take 1 tablet (25 mg total) by mouth 2 (two) times daily. 60 tablet 1 09/21/2019 at 1830  . Multiple Vitamins-Minerals (PRESERVISION AREDS 2 PO) Take 1 tablet by mouth 2 (two) times daily.   09/21/2019 at Unknown time  . multivitamin (RENA-VIT) TABS tablet Take 1 tablet by mouth every evening.    09/21/2019 at Unknown time  . Omega-3 Fatty Acids (OMEGA-3 FISH OIL) 300 MG CAPS Take 300 mg by mouth 2 (two) times daily.   09/21/2019 at Unknown time  . omeprazole (PRILOSEC) 20 MG capsule Take 20 mg by mouth every morning.    09/21/2019 at Unknown time  . Propylene Glycol (SYSTANE BALANCE) 0.6 % SOLN Place 1 drop into both eyes 3 (three) times daily as needed (for burning or dry eyes).    Past Month at Unknown time  . RENVELA 800 MG tablet Take 2,400 mg by mouth 3 (three) times daily with meals. Takes 1 tablet with snacks   09/21/2019 at Unknown time  . warfarin (COUMADIN) 5 MG tablet TAKE 1 & 1/2 TABLETS BY MOUTH (ONE & ONE-HALF) TO 2 TABLETS ONCE DAILY AS DIRECTED BY COUMADIN CLINIC (Patient taking differently: Take 7.5-10 mg by mouth daily at 6 PM. TAKE  2 TABLET BY MOUTH ON Monday AND 1 1/2 TABLETS BY MOUTH (ONE & ONE-HALF) ON Tuesday, Wednesday, Thursday,  Friday, Saturday, Sunday.) 150 tablet 0 09/21/2019 at 1830  . predniSONE (STERAPRED UNI-PAK 21 TAB) 10 MG (21) TBPK tablet Take by mouth daily. Take 6 tabs by mouth daily  for 2 days, then 5 tabs for 2 days, then 4 tabs for 2 days, then 3 tabs for 2 days, 2 tabs for 2 days, then 1 tab by mouth daily for 2 days (Patient not taking: Reported on 09/22/2019) 42 tablet 0 Not Taking at Unknown time    Assessment: Pharmacy consulted to dose warfarin in patient with atrial fibrillation.   Coumadin 10mg  dose ordered 2/15 not given. Patient now started on amiodarone, will have to be careful with dosing coumadin d/t drug interaction. INR is now therapeutic at 2.1> 2.5.     Home dose listed as 10 mg every Mon and 7.5 mg ROW per anticoag clinic.  Goal of Therapy:  INR 2-3 Monitor platelets by anticoagulation protocol: Yes   Plan:  Warfarin 5 mg x 1 dose. Lower dose due to new amiodarone Monitor daily INR and s/s of bleeding.  Isac Sarna, BS Pharm D, [place] Clinical Pharmacist Pager [phone] 09/26/2019 8:54 AM

## 2019-09-26 NOTE — Care Management Important Message (Signed)
Important Message  Patient Details  Name: Sabrina Beck MRN: [id number] Date of Birth: [birth year]   Medicare Important Message Given:  Yes     Tommy Medal 09/26/2019, 5:19 PM

## 2019-09-26 NOTE — Procedures (Signed)
    HEMODIALYSIS TREATMENT NOTE:  3.5 hour heparin-free treatment completed via left forearm AVF (15g/antegrade). Goal met: 1.3 liters removed; BP could not tolerate further UF, even with midodrine and albumin administration.  All blood was returned and hemostasis was achieved in 15 minutes.  Rockwell Alexandria, RN

## 2019-09-26 NOTE — Progress Notes (Addendum)
Patient Demographics:    Sabrina Beck, is a 79 y.o. female, DOB - 1941-06-12, VVZ:[id number]  Admit date - 09/22/2019   Admitting Physician Shade Rivenbark Denton Brick, MD  Outpatient Primary MD for the patient is Redmond School, MD  LOS - 4   Chief Complaint  Patient presents with  . Shortness of Breath        Subjective:    Kaidyn Hernandes today has no fevers, no emesis,  No chest pain,   -Cough is less frequent and less productive -Shortness of breath at rest is improving dyspnea on exertion persist  Assessment  & Plan :    Principal Problem:   PNA (pneumonia) Active Problems:   Atrial fibrillation (HCC)   Long term current use of anticoagulant therapy   ESRD (end stage renal disease) (HCC)   Anemia   Sepsis (HCC)   Atrial fibrillation with rapid ventricular response (HCC)   Chronic systolic heart failure [facility])  Brief Summary 79 y.o. female with past medical history relevant for chronic A. fib on Coumadin for anticoagulation and metoprolol for rate control, as well as history of ESRD with hemodialysis on Mondays Wednesdays and Fridays, COPD, dCHF and anemia of ESRD admitted on 09/22/2019 with new onset acute hypoxic respiratory failure in the setting of sepsis due to bilateral pneumonia and A. fib with RVR (not new)  A/p 1)Bilateral community-acquired Pneumonia with Acute hypoxic respiratory failure--- hypoxia is new this admission, -Now requiring 3 L of oxygen via nasal cannula down from 5 L -Cough and dyspnea improving -Repeat chest x-ray 09/27/2019 requested -No fevers or leukocytosis -Overall sepsis pathophysiology appears to have resolved -Complete azithromycin on 09/27/2019 -Okay to continue IV Rocephin thru 09/29/19 - c/n bronchodilators and mucolytics as ordered   2)Chronic Afib --- with RVR, last known EF 45%, patient was treated with IV Cardizem became hypotensive Cardizem was discontinued,  tachycardia reoccurred, IV Cardizem was tried again patient became hypotensive again -Tachycardia persisted on IV amiodarone and metoprolol---  -Cardiology consult appreciated,  -Currently on IV amiodarone and IV esmolol drip given persistent challenges with rate control -TSH is 4.3,  -Coumadin for anticoagulation,  INR 2.5 -Repeat echocardiogram when rate control is improved -Plan is to continue  IV amiodarone AND iv esmolol (patient required this combo during her last hospitalization in 2020 for rate control) - Start Lopressor 12.5 mg bid with associated wean in esmolol infusion as tolerated-over the weekend - plan to eventually get her back on oral beta-blocker alone if possible.  -Okay to start midodrine 5 mg tid to allow titration of AV nodal blockers.  This may also help with her hemodialysis sessions.  3)Sepsis secondary to pneumonia in a hemodialysis patient--- we avoided giving full amount of required IV fluid boluses due to hemodialysis/ESRD status -Sepsis pathophysiology appears to be resolving, no fevers, no leukocytosis, oxygen requirement is improving -Repeat chest x-ray 09/25/2019 with left-sided pneumonia suspect chest x-ray findings is lagging behind clinical improvement -Repeat chest x-ray pending on 09/27/2019 -Treat as above #1  4)ESRD--nephrology consult for HD appreciated patient usually gets HD Monday Wednesdays and Fridays, she has not missed any HD sessions --Last HD treatment 09/26/2019 -May need midodrine for pressure support with HD sessions  5)-anemia of CKD--- hemoglobin is stable , EPO/ESA agent per  nephrology team  6) acute hypoxic respiratory failure secondary to #1 #2 above--- currently requiring oxygen, hopefully hypoxia will improve with hemodialysis and treatment of pneumonia , as well as with better rate control as above #1  7)HFrEF--acute on chronic combined systolic and diastolic dysfunction CHF--- fluid balance remains negative, - weight appears not  to be accurate, -Continue attempts at rate control as above in #2,  continue to use hemodialysis to address volume status, -Cardiology input appreciated -Repeat echo when rate control improves  Disposition/Need for in-Hospital Stay- patient unable to be discharged at this time due to --pneumonia requiring IV antibiotics, A. fib with RVR requiring IV amiodarone and IV esmolol drip for rate control,  -Remains hypoxic with oxygen -Not medically ready for discharge home at this time  Code Status : full  Family Communication:  (patient is alert, awake and coherent) Attempted to call Son [name]-- [phone]--EGBT message-  -Discussed with son [name] at bedside  Consults  : Cardiology/nephrology  DVT Prophylaxis  : Coumadin- SCDs   Lab Results  Component Value Date   PLT 171 09/26/2019   Inpatient Medications  Scheduled Meds: . budesonide  0.5 mg Inhalation BID  . Chlorhexidine Gluconate Cloth  6 each Topical Q0600  . Chlorhexidine Gluconate Cloth  6 each Topical Q0600  . cholecalciferol  2,000 Units Oral QPM  . gabapentin  100 mg Oral BID  . guaiFENesin  600 mg Oral BID  . levalbuterol  0.63 mg Nebulization TID  . metoprolol tartrate  12.5 mg Oral BID  . midodrine  5 mg Oral TID WC  . multivitamin  1 tablet Oral QPM  . multivitamin-lutein  1 capsule Oral BID  . omega-3 acid ethyl esters  1,000 mg Oral BID  . pantoprazole  40 mg Oral Daily  . sevelamer carbonate  2,400 mg Oral TID WC  . sodium chloride flush  3 mL Intravenous Q12H  . warfarin  5 mg Oral Once  . Warfarin - Pharmacist Dosing Inpatient   Does not apply q1800   Continuous Infusions: . sodium chloride 250 mL (09/23/19 0820)  . sodium chloride    . sodium chloride    . amiodarone 30 mg/hr (09/26/19 1057)  . azithromycin 500 mg (09/26/19 1056)  . cefTRIAXone (ROCEPHIN)  IV 1 g (09/26/19 1006)  . esmolol 25 mcg/kg/min (09/26/19 0625)   PRN Meds:.sodium chloride, sodium chloride, sodium chloride, acetaminophen  **OR** acetaminophen, guaiFENesin-dextromethorphan, lidocaine (PF), lidocaine-prilocaine, ondansetron (ZOFRAN) IV, ondansetron **OR** ondansetron (ZOFRAN) IV, pentafluoroprop-tetrafluoroeth, polyethylene glycol, polyvinyl alcohol, sodium chloride flush, traZODone    Anti-infectives (From admission, onward)   Start     Dose/Rate Route Frequency Ordered Stop   09/23/19 1100  vancomycin (VANCOCIN) IVPB 1000 mg/200 mL premix     1,000 mg 200 mL/hr over 60 Minutes Intravenous  Once 09/23/19 1049 09/23/19 1233   09/23/19 1000  azithromycin (ZITHROMAX) 500 mg in sodium chloride 0.9 % 250 mL IVPB     500 mg 250 mL/hr over 60 Minutes Intravenous Every 24 hours 09/22/19 1018     09/23/19 1000  cefTRIAXone (ROCEPHIN) 1 g in sodium chloride 0.9 % 100 mL IVPB     1 g 200 mL/hr over 30 Minutes Intravenous Every 24 hours 09/22/19 1018     09/22/19 1800  vancomycin (VANCOCIN) IVPB 1000 mg/200 mL premix  Status:  Discontinued     1,000 mg 200 mL/hr over 60 Minutes Intravenous Every M-W-F (Hemodialysis) 09/22/19 1300 09/23/19 1049   09/22/19 1030  cefTRIAXone (ROCEPHIN) 1 g  in sodium chloride 0.9 % 100 mL IVPB  Status:  Discontinued     1 g 200 mL/hr over 30 Minutes Intravenous Every 24 hours 09/22/19 1015 09/22/19 1018   09/22/19 1030  azithromycin (ZITHROMAX) 500 mg in sodium chloride 0.9 % 250 mL IVPB  Status:  Discontinued     500 mg 250 mL/hr over 60 Minutes Intravenous Every 24 hours 09/22/19 1015 09/22/19 1018   09/22/19 0715  vancomycin (VANCOREADY) IVPB 1500 mg/300 mL     1,500 mg 150 mL/hr over 120 Minutes Intravenous  Once 09/22/19 0704 09/22/19 1600   09/22/19 0700  cefTRIAXone (ROCEPHIN) 1 g in sodium chloride 0.9 % 100 mL IVPB     1 g 200 mL/hr over 30 Minutes Intravenous  Once 09/22/19 0645 09/22/19 0904   09/22/19 0700  azithromycin (ZITHROMAX) 500 mg in sodium chloride 0.9 % 250 mL IVPB     500 mg 250 mL/hr over 60 Minutes Intravenous  Once 09/22/19 0645 09/22/19 1017         Objective:   Vitals:   09/26/19 0803 09/26/19 0815 09/26/19 0845 09/26/19 1111  BP:  111/62 109/82   Pulse:  97 100   Resp:  20 20   Temp:    97.7 F (36.5 C)  TempSrc:    Oral  SpO2: 100% 94% 97%   Weight:      Height:        Wt Readings from Last 3 Encounters:  09/26/19 98.2 kg  06/05/19 99.8 kg  05/06/19 98.9 kg     Intake/Output Summary (Last 24 hours) at 09/26/2019 1133 Last data filed at 09/26/2019 3244 Gross per 24 hour  Intake 750.29 ml  Output --  Net 750.29 ml     Physical Exam  Gen:- Awake Alert, in no acute distress, speaking in short sentences HEENT:- [place].AT, No sclera icterus Nose- [place] 3L/min Neck-Supple Neck,No JVD,.  Lungs-diminished in bases, no wheezing CV- S1, S2 normal, irregularly irregular heart rate in the 110s abd-  +ve B.Sounds, Abd Soft, No tenderness,    Extremity/Skin:- pedal pulses present  Psych-affect is appropriate, oriented x3 Neuro-generalized weakness, no new focal deficits, no tremors   Data Review:   Micro Results Recent Results (from the past 240 hour(s))  Respiratory Panel by RT PCR (Flu A&B, Covid) - Nasopharyngeal Swab     Status: None   Collection Time: 09/22/19  5:37 AM   Specimen: Nasopharyngeal Swab  Result Value Ref Range Status   SARS Coronavirus 2 by RT PCR NEGATIVE NEGATIVE Final    Comment: (NOTE) SARS-CoV-2 target nucleic acids are NOT DETECTED. The SARS-CoV-2 RNA is generally detectable in upper respiratoy specimens during the acute phase of infection. The lowest concentration of SARS-CoV-2 viral copies this assay can detect is 131 copies/mL. A negative result does not preclude SARS-Cov-2 infection and should not be used as the sole basis for treatment or other patient management decisions. A negative result may occur with  improper specimen collection/handling, submission of specimen other than nasopharyngeal swab, presence of viral mutation(s) within the areas targeted by this assay, and inadequate number  of viral copies (<131 copies/mL). A negative result must be combined with clinical observations, patient history, and epidemiological information. The expected result is Negative. Fact Sheet for Patients:  PinkCheek.be Fact Sheet for Healthcare Providers:  GravelBags.it This test is not yet ap proved or cleared by the [place] FDA and  has been authorized for detection and/or diagnosis of SARS-CoV-2 by FDA under an Emergency  Use Authorization (EUA). This EUA will remain  in effect (meaning this test can be used) for the duration of the COVID-19 declaration under Section 564(b)(1) of the Act, 21 U.S.C. section 360bbb-3(b)(1), unless the authorization is terminated or revoked sooner.    Influenza A by PCR NEGATIVE NEGATIVE Final   Influenza B by PCR NEGATIVE NEGATIVE Final    Comment: (NOTE) The Xpert Xpress SARS-CoV-2/FLU/RSV assay is intended as an aid in  the diagnosis of influenza from Nasopharyngeal swab specimens and  should not be used as a sole basis for treatment. Nasal washings and  aspirates are unacceptable for Xpert Xpress SARS-CoV-2/FLU/RSV  testing. Fact Sheet for Patients: PinkCheek.be Fact Sheet for Healthcare Providers: GravelBags.it This test is not yet approved or cleared by the [place] FDA and  has been authorized for detection and/or diagnosis of SARS-CoV-2 by  FDA under an Emergency Use Authorization (EUA). This EUA will remain  in effect (meaning this test can be used) for the duration of the  Covid-19 declaration under Section 564(b)(1) of the Act, 21  U.S.C. section 360bbb-3(b)(1), unless the authorization is  terminated or revoked. Performed at [facility], [address], [place], [place] [address]   Blood culture (routine x 2)     Status: None (Preliminary result)   Collection Time: 09/22/19  6:32 AM   Specimen: BLOOD  Result  Value Ref Range Status   Specimen Description BLOOD LEFT HAND  Final   Special Requests   Final    BOTTLES DRAWN AEROBIC AND ANAEROBIC Blood Culture adequate volume   Culture   Final    NO GROWTH 4 DAYS Performed at [facility], [address], [place], [place] [address]    Report Status PENDING  Incomplete  Blood culture (routine x 2)     Status: None (Preliminary result)   Collection Time: 09/22/19  6:39 AM   Specimen: BLOOD  Result Value Ref Range Status   Specimen Description BLOOD LEFT HAND  Final   Special Requests   Final    BOTTLES DRAWN AEROBIC AND ANAEROBIC Blood Culture adequate volume   Culture   Final    NO GROWTH 4 DAYS Performed at [facility], [address] [place], [place] [address]    Report Status PENDING  Incomplete  MRSA PCR Screening     Status: None   Collection Time: 09/22/19  8:45 PM   Specimen: Nasal Mucosa; Nasopharyngeal  Result Value Ref Range Status   MRSA by PCR NEGATIVE NEGATIVE Final    Comment:        The GeneXpert MRSA Assay (FDA approved for NASAL specimens only), is one component of a comprehensive MRSA colonization surveillance program. It is not intended to diagnose MRSA infection nor to guide or monitor treatment for MRSA infections. Performed at [facility], [address] [place], [place] [address]     Radiology Reports DG Chest [place] 1 View  Result Date: 09/25/2019 CLINICAL DATA:  79 year old with shortness of breath. Follow-up pneumonia. Current history of end-stage renal disease on hemodialysis. Negative COVID-19 test on 09/22/2019. EXAM: PORTABLE CHEST 1 VIEW COMPARISON:  09/22/2019 and earlier, including CT chest 04/20/2019. FINDINGS: Cardiac silhouette moderately to markedly enlarged, unchanged. Airspace opacities throughout both lungs, most confluent in the LEFT LOWER LOBE, worse than on the examination 3 days ago. IMPRESSION: Worsening bilateral airspace opacities, most confluent in the LEFT LOWER LOBE. Pneumonia is  favored over pulmonary edema. Electronically Signed   By: Evangeline Dakin M.D.   On: 09/25/2019  10:18   DG Chest Port 1 View  Result Date: 09/22/2019 CLINICAL DATA:  Shortness of breath EXAM: PORTABLE CHEST 1 VIEW COMPARISON:  04/20/2019 FINDINGS: Patchy bilateral airspace opacity most dense in the peripheral left lung. Cardiomegaly. No effusion or pneumothorax. IMPRESSION: 1. Patchy bilateral pneumonia. 2. Chronic cardiomegaly. Electronically Signed   By: Monte Fantasia M.D.   On: 09/22/2019 05:55     CBC Recent Labs  Lab 09/22/19 0530 09/23/19 0535 09/26/19 0405  WBC 11.0* 9.9 7.9  HGB 11.6* 10.7* 10.4*  HCT 35.7* 32.9* 31.9*  PLT 171 155 171  MCV 114.4* 113.4* 114.3*  MCH 37.2* 36.9* 37.3*  MCHC 32.5 32.5 32.6  RDW 14.7 14.8 14.5  LYMPHSABS 2.4  --   --   MONOABS 0.9  --   --   EOSABS 0.1  --   --   BASOSABS 0.1  --   --     Chemistries  Recent Labs  Lab 09/22/19 0530 09/22/19 1859 09/23/19 0535 09/24/19 0402 09/25/19 0356 09/26/19 0405  NA 137  --  136 137 136 136  K 4.7  --  4.5 5.1 4.4 4.5  CL 96*  --  91* 91* 88* 89*  CO2 21*  --  25 24 26 25   GLUCOSE 165*  --  134* 138* 122* 112*  BUN 62*  --  40* 63* 42* 66*  CREATININE 12.94*  --  8.79* 11.04* 7.42* 9.62*  CALCIUM 10.0  --  9.6 9.8 10.0 9.6  MG  --  1.8 1.9  --   --   --   AST  --   --  21  --   --   --   ALT  --   --  21  --   --   --   ALKPHOS  --   --  63  --   --   --   BILITOT  --   --  1.0  --   --   --    ------------------------------------------------------------------------------------------------------------------ No results for input(s): CHOL, HDL, LDLCALC, TRIG, CHOLHDL, LDLDIRECT in the last 72 hours.  No results found for: HGBA1C ------------------------------------------------------------------------------------------------------------------ No results for input(s): TSH, T4TOTAL, T3FREE, THYROIDAB in the last 72 hours.  Invalid input(s):  FREET3 ------------------------------------------------------------------------------------------------------------------ No results for input(s): VITAMINB12, FOLATE, FERRITIN, TIBC, IRON, RETICCTPCT in the last 72 hours.  Coagulation profile Recent Labs  Lab 09/22/19 0530 09/23/19 0535 09/24/19 0402 09/25/19 0356 09/26/19 0405  INR 2.1* 2.2* 1.8* 2.1* 2.5*    No results for input(s): DDIMER in the last 72 hours.  Cardiac Enzymes No results for input(s): CKMB, TROPONINI, MYOGLOBIN in the last 168 hours.  Invalid input(s): CK ------------------------------------------------------------------------------------------------------------------    Component Value Date/Time   BNP 344.0 (H) 04/08/2019 1600    Roxan Hockey M.D on 09/26/2019 at 11:33 AM  Go to [email or web address] - for contact info  [facility] - Office  [phone]

## 2019-09-26 NOTE — Progress Notes (Signed)
[facility] Progress Note  Name: Sabrina Beck MRN: [id number] DOB: 1940-09-07  Chief Complaint:  Shortness of breath   Subjective:   Stable on esmolol and amio, for HD today Stable dyspnea, HR in 90s No sig LEE Afebrile, BPs soft  ------ Background on consult:  Sabrina Beck is a 79 y.o. female with a history of end-stage renal disease, chronic diastolic CHF, anemia, and atrial fibrillation who presented to the hospital with shortness of breath.  She was found to have patchy bilateral pneumonia on chest x-ray.  Course here has also been complicated by A fib with RVR and she was ordered a diltiazem infusion.  She has been wheezing in the ER and has required supplemental oxygen at 3.5 - 4 L most recently.  She states that she noticed her shortness of breath when she woke up today.  I have discussed with the dialysis nurse to let her know the patient is here and to anticipate orders for dialysis soon.  She normally dialyzes Monday Wednesday Friday at [facility] [address]  States that she did go to dialysis on Friday and stayed her full treatment and normally does not have issues with tolerating dialysis.   She denies any nausea or vomiting or dizziness.  Denies any subjective fever or cough.  Just had a breathing treatment - still short of breath.  Thinks that she may carry fluid around her abdomen and lungs.     Intake/Output Summary (Last 24 hours) at 09/26/2019 1129 Last data filed at 09/26/2019 6144 Gross per 24 hour  Intake 750.29 ml  Output --  Net 750.29 ml    Vitals:  Vitals:   09/26/19 0803 09/26/19 0815 09/26/19 0845 09/26/19 1111  BP:  111/62 109/82   Pulse:  97 100   Resp:  20 20   Temp:    97.7 F (36.5 C)  TempSrc:    Oral  SpO2: 100% 94% 97%   Weight:      Height:         Physical Exam:    NAD, lying in bed Mild increased work of breathing with conversation, diminished in bases Tachycardic, irregular, no rub Left arm AV fistula with  bruit and thrill No significant lower extremity edema Soft, nontender, obese Nonfocal, CN II through XII grossly intact  Medications reviewed    Labs:  BMP Latest Ref Rng & Units 09/26/2019 09/25/2019 09/24/2019  Glucose 70 - 99 mg/dL 112(H) 122(H) 138(H)  BUN 8 - 23 mg/dL 66(H) 42(H) 63(H)  Creatinine 0.44 - 1.00 mg/dL 9.62(H) 7.42(H) 11.04(H)  BUN/Creat Ratio 11 - 26 - - -  Sodium 135 - 145 mmol/L 136 136 137  Potassium 3.5 - 5.1 mmol/L 4.5 4.4 5.1  Chloride 98 - 111 mmol/L 89(L) 88(L) 91(L)  CO2 22 - 32 mmol/L 25 26 24   Calcium 8.9 - 10.3 mg/dL 9.6 10.0 9.8     Assessment/Plan:   # End-stage renal disease on hemodialysis - MWF [facility] using LUE AVF - Cont HD on schedule, UF as able, next treatment today, 1 L UF, AV fistula, no heparin, 2K bath  # Acute hypoxic respiratory failure -Treatment for pneumonia per primary team  - CTX and Azithro  # Bilateral pneumonia - healthcare associated - Therapies per primary team -  On azithromycin ceftriaxone and Vanc Blood cultures no growth to date, COVID-19 and influenza negative  # Chronic combined systolic and diastolic CHF - optimize volume status with HD, cardiology following   #  A. fib with RVR - Cardiology following, on warfarin  # Anemia CKD - No acute indication for ESA   # Secondary hyperpara/bone mineral metabolism -Continue binders, outpatient management  Will not see over the weekend,  Call us for any questions or concerns.    Rexene Agent, MD 09/26/2019  11:29 AM

## 2019-09-26 NOTE — Progress Notes (Signed)
Patient having dialysis, will get echo in morning, made nurse aware.

## 2019-09-26 NOTE — Progress Notes (Signed)
Progress Note  Patient Name: Sabrina Beck Date of Encounter: 09/26/2019  Primary Cardiologist: Sanda Klein, MD  Subjective   Still feels somewhat short of breath, cough is lessened, no chest pain or sense of palpitations.  Inpatient Medications    Scheduled Meds: . budesonide  0.5 mg Inhalation BID  . Chlorhexidine Gluconate Cloth  6 each Topical Q0600  . Chlorhexidine Gluconate Cloth  6 each Topical Q0600  . cholecalciferol  2,000 Units Oral QPM  . gabapentin  100 mg Oral BID  . guaiFENesin  600 mg Oral BID  . levalbuterol  0.63 mg Nebulization TID  . multivitamin  1 tablet Oral QPM  . multivitamin-lutein  1 capsule Oral BID  . omega-3 acid ethyl esters  1,000 mg Oral BID  . pantoprazole  40 mg Oral Daily  . sevelamer carbonate  2,400 mg Oral TID WC  . sodium chloride flush  3 mL Intravenous Q12H  . warfarin  5 mg Oral Once  . Warfarin - Pharmacist Dosing Inpatient   Does not apply q1800   Continuous Infusions: . sodium chloride 250 mL (09/23/19 0820)  . sodium chloride    . sodium chloride    . amiodarone 30 mg/hr (09/26/19 0625)  . azithromycin Stopped (09/25/19 1106)  . cefTRIAXone (ROCEPHIN)  IV Stopped (09/25/19 0959)  . esmolol 25 mcg/kg/min (09/26/19 0625)   PRN Meds: sodium chloride, sodium chloride, sodium chloride, acetaminophen **OR** acetaminophen, guaiFENesin-dextromethorphan, lidocaine (PF), lidocaine-prilocaine, ondansetron (ZOFRAN) IV, ondansetron **OR** ondansetron (ZOFRAN) IV, pentafluoroprop-tetrafluoroeth, polyethylene glycol, polyvinyl alcohol, sodium chloride flush, traZODone   Vital Signs    Vitals:   09/26/19 0802 09/26/19 0803 09/26/19 0815 09/26/19 0845  BP:   111/62 109/82  Pulse:   97 100  Resp:   20 20  Temp:      TempSrc:      SpO2: 94% 100% 94% 97%  Weight:      Height:        Intake/Output Summary (Last 24 hours) at 09/26/2019 0939 Last data filed at 09/26/2019 0625 Gross per 24 hour  Intake 872.21 ml  Output --  Net  872.21 ml   Filed Weights   09/24/19 0500 09/25/19 0500 09/26/19 0530  Weight: 99.9 kg 96.9 kg 98.2 kg    Telemetry    Atrial fibrillation.  Personally reviewed.  Physical Exam   GEN:  Elderly woman, no acute distress.   Neck: No JVD. Cardiac:  Irregularly irregular, soft systolic murmur, no gallop.  Respiratory:  Few scattered rhonchi, no wheezing. GI: Soft, nontender, bowel sounds present. MS: No edema; No deformity. Neuro:  Nonfocal. Psych: Alert and oriented x 3. Normal affect.  Labs    Chemistry Recent Labs  Lab 09/22/19 0530 09/22/19 0530 09/23/19 0535 09/23/19 0535 09/24/19 0402 09/25/19 0356 09/26/19 0405  NA 137   < > 136   < > 137 136 136  K 4.7   < > 4.5   < > 5.1 4.4 4.5  CL 96*   < > 91*   < > 91* 88* 89*  CO2 21*   < > 25   < > 24 26 25   GLUCOSE 165*   < > 134*   < > 138* 122* 112*  BUN 62*   < > 40*   < > 63* 42* 66*  CREATININE 12.94*   < > 8.79*   < > 11.04* 7.42* 9.62*  CALCIUM 10.0   < > 9.6   < > 9.8 10.0 9.6  PROT  --   --  6.4*  --   --   --   --   ALBUMIN  --   --  3.1*  --   --   --  3.1*  AST  --   --  21  --   --   --   --   ALT  --   --  21  --   --   --   --   ALKPHOS  --   --  63  --   --   --   --   BILITOT  --   --  1.0  --   --   --   --   GFRNONAA 2*   < > 4*   < > 3* 5* 3*  GFRAA 3*   < > 4*   < > 3* 5* 4*  ANIONGAP 20*  --  20*  --   --   --   --    < > = values in this interval not displayed.     Hematology Recent Labs  Lab 09/22/19 0530 09/23/19 0535 09/26/19 0405  WBC 11.0* 9.9 7.9  RBC 3.12* 2.90* 2.79*  HGB 11.6* 10.7* 10.4*  HCT 35.7* 32.9* 31.9*  MCV 114.4* 113.4* 114.3*  MCH 37.2* 36.9* 37.3*  MCHC 32.5 32.5 32.6  RDW 14.7 14.8 14.5  PLT 171 155 171    Radiology    DG Chest Port 1 View  Result Date: 09/25/2019 CLINICAL DATA:  79 year old with shortness of breath. Follow-up pneumonia. Current history of end-stage renal disease on hemodialysis. Negative COVID-19 test on 09/22/2019. EXAM: PORTABLE CHEST  1 VIEW COMPARISON:  09/22/2019 and earlier, including CT chest 04/20/2019. FINDINGS: Cardiac silhouette moderately to markedly enlarged, unchanged. Airspace opacities throughout both lungs, most confluent in the LEFT LOWER LOBE, worse than on the examination 3 days ago. IMPRESSION: Worsening bilateral airspace opacities, most confluent in the LEFT LOWER LOBE. Pneumonia is favored over pulmonary edema. Electronically Signed   By: Evangeline Dakin M.D.   On: 09/25/2019 10:18    Cardiac Studies   Echocardiogram 08/22/2018: Study Conclusions   - Left ventricle: The cavity size was normal. Wall thickness was  increased in a pattern of moderate LVH. The estimated ejection  fraction was 45%. There is hypokinesis of the midinferoseptal  myocardium. The study was not technically sufficient to allow  evaluation of LV diastolic dysfunction due to atrial  fibrillation.  - Aortic valve: Mildly calcified annulus. Trileaflet; mildly  calcified leaflets.  - Mitral valve: Mildly calcified annulus. There was mild  regurgitation.  - Left atrium: The atrium was moderately dilated.  - Right atrium: The atrium was mildly to moderately dilated.  - Atrial septum: No defect or patent foramen ovale was identified.  - Tricuspid valve: There was mild regurgitation.  - Pulmonary arteries: PA peak pressure: 21 mm Hg (S).  - Pericardium, extracardiac: There was no pericardial effusion.   Patient Profile     79 y.o. female with a history of permanent atrial fibrillation (on Coumadin for anticoagulation), chronic combined systolic and diastolic CHF (EF 82% by echo in 08/2018), HTN and ESRDwho is currently admitted with PNA. Cardiology consulted for atrial fibrillation with RVR.   Assessment & Plan    1.  Permanent atrial fibrillation with recent RVR in the setting of physiologic stressors.  Heart rate control is adequate at this time on combination of IV esmolol and IV amiodarone (similar combination  used with  prior hospitalization).  Blood pressure currently stable.  She is on Coumadin for stroke prophylaxis with INR 2.5.  Outpatient regimen included only Lopressor at 25 mg twice daily for rate control.  2.  Secondary cardiomyopathy with LVEF approximately 45% as of January 2020.  Follow-up echocardiogram is being arranged for reevaluation now that heart rate control is more adequate.  3.  Sepsis with pneumonia.  Management continues per primary team.  4.  ESRD on hemodialysis.  Hypotension has limited UF sessions.  I note that she has been given midodrine in the past.  Follow-up echocardiogram has been ordered for reassessment of LVEF.  Would continue IV amiodarone for now.  We will try to start very low-dose oral Lopressor with associated wean in esmolol infusion as tolerated.  This general course can be continued over the weekend with plan to eventually get her back on oral beta-blocker alone if possible.  Also plan to start midodrine on a standing basis to allow titration of AV nodal blockers.  This may also help with her hemodialysis sessions.  Signed, Rozann Lesches, MD  09/26/2019, 9:39 AM

## 2019-09-27 ENCOUNTER — Inpatient Hospital Stay (HOSPITAL_COMMUNITY): Payer: Medicare HMO

## 2019-09-27 DIAGNOSIS — I361 Nonrheumatic tricuspid (valve) insufficiency: Secondary | ICD-10-CM

## 2019-09-27 DIAGNOSIS — I340 Nonrheumatic mitral (valve) insufficiency: Secondary | ICD-10-CM

## 2019-09-27 LAB — ECHOCARDIOGRAM COMPLETE
Height: 64 in
Weight: 3460.34 oz

## 2019-09-27 LAB — CULTURE, BLOOD (ROUTINE X 2)
Culture: NO GROWTH
Culture: NO GROWTH
Special Requests: ADEQUATE
Special Requests: ADEQUATE

## 2019-09-27 LAB — PROTIME-INR
INR: 2.4 — ABNORMAL HIGH (ref 0.8–1.2)
Prothrombin Time: 26.1 seconds — ABNORMAL HIGH (ref 11.4–15.2)

## 2019-09-27 MED ORDER — PANTOPRAZOLE SODIUM 40 MG PO TBEC
40.0000 mg | DELAYED_RELEASE_TABLET | Freq: Two times a day (BID) | ORAL | Status: DC
  Administered 2019-09-28 – 2019-09-30 (×5): 40 mg via ORAL
  Filled 2019-09-27 (×5): qty 1

## 2019-09-27 MED ORDER — WARFARIN SODIUM 5 MG PO TABS
5.0000 mg | ORAL_TABLET | Freq: Once | ORAL | Status: AC
  Administered 2019-09-27: 5 mg via ORAL
  Filled 2019-09-27: qty 1

## 2019-09-27 MED ORDER — METOPROLOL TARTRATE 25 MG PO TABS
25.0000 mg | ORAL_TABLET | Freq: Two times a day (BID) | ORAL | Status: DC
  Administered 2019-09-27 – 2019-09-30 (×7): 25 mg via ORAL
  Filled 2019-09-27 (×7): qty 1

## 2019-09-27 NOTE — Progress Notes (Signed)
ANTICOAGULATION CONSULT NOTE -  Pharmacy Consult for warfarin Indication: atrial fibrillation  No Known Allergies  Patient Measurements: Height: 5\' 4"  (162.6 cm) Weight: 216 lb 4.3 oz (98.1 kg) IBW/kg (Calculated) : 54.7   Vital Signs: BP: 117/69 (02/20 0500) Pulse Rate: 91 (02/20 0700)  Labs: Recent Labs    09/25/19 0356 09/26/19 0405 09/27/19 0525  HGB  --  10.4*  --   HCT  --  31.9*  --   PLT  --  171  --   LABPROT 23.1* 26.8* 26.1*  INR 2.1* 2.5* 2.4*  CREATININE 7.42* 9.62*  --     Estimated Creatinine Clearance: 5.3 mL/min (A) (by C-G formula based on SCr of 9.62 mg/dL (H)).   Medical History: Past Medical History:  Diagnosis Date  . Anemia   . Atrial fibrillation ([place])   . Chest pain 05/15/2006   Stress test negative for ischemia  . Diastolic heart failure ([place])   . Diverticulosis   . ESRD on hemodialysis ([place])   . GERD (gastroesophageal reflux disease)   . Headache   . Mixed hyperlipidemia   . Peripheral neuropathy   . PONV (postoperative nausea and vomiting)   . Systemic hypertension   . Uterine cancer ([facility])     Medications:  Medications Prior to Admission  Medication Sig Dispense Refill Last Dose  . acetaminophen (TYLENOL) 500 MG tablet Take 1,000 mg by mouth every 6 (six) hours as needed for moderate pain.    Past Week at Unknown time  . albuterol (VENTOLIN HFA) 108 (90 Base) MCG/ACT inhaler Inhale 2 puffs into the lungs every 4 (four) hours as needed for wheezing or shortness of breath (cough, shortness of breath or wheezing.). 18 g 1 09/22/2019 at Unknown time  . Cholecalciferol (VITAMIN D) 2000 UNITS CAPS Take 2,000 Units by mouth every evening.    09/21/2019 at Unknown time  . fluticasone (FLOVENT HFA) 44 MCG/ACT inhaler Inhale 2 puffs into the lungs 2 (two) times daily. 1 Inhaler 2 Past Month at Unknown time  . gabapentin (NEURONTIN) 100 MG capsule Take 100 mg by mouth 2 (two) times daily.    09/21/2019 at Unknown time  . metoprolol tartrate  (LOPRESSOR) 25 MG tablet Take 1 tablet (25 mg total) by mouth 2 (two) times daily. 60 tablet 1 09/21/2019 at 1830  . Multiple Vitamins-Minerals (PRESERVISION AREDS 2 PO) Take 1 tablet by mouth 2 (two) times daily.   09/21/2019 at Unknown time  . multivitamin (RENA-VIT) TABS tablet Take 1 tablet by mouth every evening.    09/21/2019 at Unknown time  . Omega-3 Fatty Acids (OMEGA-3 FISH OIL) 300 MG CAPS Take 300 mg by mouth 2 (two) times daily.   09/21/2019 at Unknown time  . omeprazole (PRILOSEC) 20 MG capsule Take 20 mg by mouth every morning.    09/21/2019 at Unknown time  . Propylene Glycol (SYSTANE BALANCE) 0.6 % SOLN Place 1 drop into both eyes 3 (three) times daily as needed (for burning or dry eyes).    Past Month at Unknown time  . RENVELA 800 MG tablet Take 2,400 mg by mouth 3 (three) times daily with meals. Takes 1 tablet with snacks   09/21/2019 at Unknown time  . warfarin (COUMADIN) 5 MG tablet TAKE 1 & 1/2 TABLETS BY MOUTH (ONE & ONE-HALF) TO 2 TABLETS ONCE DAILY AS DIRECTED BY COUMADIN CLINIC (Patient taking differently: Take 7.5-10 mg by mouth daily at 6 PM. TAKE 2 TABLET BY MOUTH ON Monday AND 1 1/2 TABLETS  BY MOUTH (ONE & ONE-HALF) ON Tuesday, Wednesday, Thursday,  Friday, Saturday, Sunday.) 150 tablet 0 09/21/2019 at 1830  . predniSONE (STERAPRED UNI-PAK 21 TAB) 10 MG (21) TBPK tablet Take by mouth daily. Take 6 tabs by mouth daily  for 2 days, then 5 tabs for 2 days, then 4 tabs for 2 days, then 3 tabs for 2 days, 2 tabs for 2 days, then 1 tab by mouth daily for 2 days (Patient not taking: Reported on 09/22/2019) 42 tablet 0 Not Taking at Unknown time    Assessment: Pharmacy consulted to dose warfarin in patient with atrial fibrillation.   Coumadin 10mg  dose ordered 2/15 not given. Patient now started on amiodarone, will have to be careful with dosing coumadin d/t drug interaction. INR is now therapeutic at 2.1> 2.5>2.4   Home dose listed as 10 mg every Mon and 7.5 mg ROW per anticoag  clinic.  Goal of Therapy:  INR 2-3 Monitor platelets by anticoagulation protocol: Yes   Plan:  Warfarin 5 mg x 1 dose. Lower dose due to new amiodarone Monitor daily INR and s/s of bleeding.  Thomasenia Sales, PharmD, MBA, BCGP Clinical Pharmacist  09/27/2019 9:40 AM

## 2019-09-27 NOTE — Progress Notes (Signed)
Patient Demographics:    Sabrina Beck, is a 79 y.o. female, DOB - May 17, 1941, INO:[id number]  Admit date - 09/22/2019   Admitting Physician Courage Denton Brick, MD  Outpatient Primary MD for the patient is Redmond School, MD  LOS - 5   Chief Complaint  Patient presents with  . Shortness of Breath        Subjective:    Sabrina Beck feels generally weak. She continues to have productive cough. Feels that shortness of breath is improving. Has been experiencing regurgitation of po intake.  Assessment  & Plan :    Principal Problem:   PNA (pneumonia) Active Problems:   Anemia   Atrial fibrillation (HCC)   Long term current use of anticoagulant therapy   ESRD (end stage renal disease) (HCC)   Sepsis (HCC)   Atrial fibrillation with rapid ventricular response (HCC)   Chronic systolic heart failure [facility])  Brief Summary 79 y.o. female with past medical history relevant for chronic A. fib on Coumadin for anticoagulation and metoprolol for rate control, as well as history of ESRD with hemodialysis on Mondays Wednesdays and Fridays, COPD, dCHF and anemia of ESRD admitted on 09/22/2019 with new onset acute hypoxic respiratory failure in the setting of sepsis due to bilateral pneumonia and A. fib with RVR (not new)  A/p 1)Bilateral community-acquired Pneumonia with Acute hypoxic respiratory failure--- hypoxia is new this admission, -Now requiring 3 L of oxygen via nasal cannula down from 5 L -Cough and dyspnea improving -Repeat chest x-ray 09/27/2019 requested -No fevers or leukocytosis -Overall sepsis pathophysiology appears to have resolved -Complete azithromycin on 09/27/2019 -Okay to continue IV Rocephin thru 09/29/19 - c/n bronchodilators and mucolytics as ordered   2)Chronic Afib --- with RVR, last known EF 45%, patient was treated with IV Cardizem became hypotensive Cardizem was discontinued,  tachycardia reoccurred, IV Cardizem was tried again patient became hypotensive again -Tachycardia persisted on IV amiodarone and metoprolol---  -Cardiology consult appreciated,  -Treated with IV amiodarone and IV esmolol drip given persistent challenges with rate control -TSH is 4.3,  -Coumadin for anticoagulation,  INR 2.4 -Repeat echocardiogram when rate control is improved -Plan is to continue  IV amiodarone AND iv esmolol (patient required this combo during her last hospitalization in 2020 for rate control) - Started on Lopressor 12.5 mg bid with associated wean in esmolol infusion as tolerated-over the weekend - She was able to wean off esmolol drip, but heart rate is elevated in 110-120 range. Will increase lopressor to 25mg  bid. Continue IV amiodarone for now  3)Sepsis secondary to pneumonia in a hemodialysis patient--- we avoided giving full amount of required IV fluid boluses due to hemodialysis/ESRD status -Sepsis pathophysiology appears to be resolving, no fevers, no leukocytosis, oxygen requirement is improving -Repeat chest x-ray 09/25/2019 with left-sided pneumonia suspect chest x-ray findings is lagging behind clinical improvement -Repeat chest x-ray on 09/27/2019, shows stable findings of bilateral patchy airspace process, worse over left base -Treat as above #1  4)ESRD--nephrology consult for HD appreciated patient usually gets HD Monday Wednesdays and Fridays, she has not missed any HD sessions --Last HD treatment 09/26/2019 -May need midodrine for pressure support with HD sessions  5)-anemia of CKD--- hemoglobin is stable , EPO/ESA agent per nephrology team  6)  acute hypoxic respiratory failure secondary to #1 #2 above--- currently requiring oxygen, hopefully hypoxia will improve with hemodialysis and treatment of pneumonia , as well as with better rate control as above #1  7)HFrEF--acute on chronic combined systolic and diastolic dysfunction CHF--- fluid balance remains  negative, - weight appears not to be accurate, -Continue attempts at rate control as above in #2,  continue to use hemodialysis to address volume status, -Cardiology input appreciated -Echo shows EF of 40-45% which is similar to Echo from 08/2018  Disposition/Need for in-Hospital Stay- patient unable to be discharged at this time due to --pneumonia requiring IV antibiotics, A. fib with RVR requiring IV amiodarone drip for rate control,  -Remains hypoxic with oxygen -Not medically ready for discharge home at this time  Code Status : full  Family Communication: discussed with patient  Consults  : Cardiology/nephrology  DVT Prophylaxis  : Coumadin- SCDs   Lab Results  Component Value Date   PLT 171 09/26/2019   Inpatient Medications  Scheduled Meds: . budesonide  0.5 mg Inhalation BID  . Chlorhexidine Gluconate Cloth  6 each Topical Q0600  . Chlorhexidine Gluconate Cloth  6 each Topical Q0600  . cholecalciferol  2,000 Units Oral QPM  . gabapentin  100 mg Oral BID  . guaiFENesin  600 mg Oral BID  . levalbuterol  0.63 mg Nebulization TID  . metoprolol tartrate  25 mg Oral BID  . midodrine  5 mg Oral TID WC  . multivitamin  1 tablet Oral QPM  . multivitamin-lutein  1 capsule Oral BID  . omega-3 acid ethyl esters  1,000 mg Oral BID  . pantoprazole  40 mg Oral Daily  . sevelamer carbonate  2,400 mg Oral TID WC  . sodium chloride flush  3 mL Intravenous Q12H  . Warfarin - Pharmacist Dosing Inpatient   Does not apply q1800   Continuous Infusions: . sodium chloride 250 mL (09/23/19 0820)  . sodium chloride    . sodium chloride    . albumin human    . amiodarone 30 mg/hr (09/27/19 1021)  . cefTRIAXone (ROCEPHIN)  IV 1 g (09/27/19 1015)  . esmolol Stopped (09/26/19 1212)   PRN Meds:.sodium chloride, sodium chloride, sodium chloride, acetaminophen **OR** acetaminophen, guaiFENesin-dextromethorphan, lidocaine (PF), lidocaine-prilocaine, ondansetron (ZOFRAN) IV, ondansetron **OR**  ondansetron (ZOFRAN) IV, pentafluoroprop-tetrafluoroeth, polyethylene glycol, polyvinyl alcohol, sodium chloride flush, traZODone    Anti-infectives (From admission, onward)   Start     Dose/Rate Route Frequency Ordered Stop   09/23/19 1100  vancomycin (VANCOCIN) IVPB 1000 mg/200 mL premix     1,000 mg 200 mL/hr over 60 Minutes Intravenous  Once 09/23/19 1049 09/23/19 1233   09/23/19 1000  azithromycin (ZITHROMAX) 500 mg in sodium chloride 0.9 % 250 mL IVPB     500 mg 250 mL/hr over 60 Minutes Intravenous Every 24 hours 09/22/19 1018 09/27/19 1214   09/23/19 1000  cefTRIAXone (ROCEPHIN) 1 g in sodium chloride 0.9 % 100 mL IVPB     1 g 200 mL/hr over 30 Minutes Intravenous Every 24 hours 09/22/19 1018     09/22/19 1800  vancomycin (VANCOCIN) IVPB 1000 mg/200 mL premix  Status:  Discontinued     1,000 mg 200 mL/hr over 60 Minutes Intravenous Every M-W-F (Hemodialysis) 09/22/19 1300 09/23/19 1049   09/22/19 1030  cefTRIAXone (ROCEPHIN) 1 g in sodium chloride 0.9 % 100 mL IVPB  Status:  Discontinued     1 g 200 mL/hr over 30 Minutes Intravenous Every 24 hours 09/22/19 1015  09/22/19 1018   09/22/19 1030  azithromycin (ZITHROMAX) 500 mg in sodium chloride 0.9 % 250 mL IVPB  Status:  Discontinued     500 mg 250 mL/hr over 60 Minutes Intravenous Every 24 hours 09/22/19 1015 09/22/19 1018   09/22/19 0715  vancomycin (VANCOREADY) IVPB 1500 mg/300 mL     1,500 mg 150 mL/hr over 120 Minutes Intravenous  Once 09/22/19 0704 09/22/19 1600   09/22/19 0700  cefTRIAXone (ROCEPHIN) 1 g in sodium chloride 0.9 % 100 mL IVPB     1 g 200 mL/hr over 30 Minutes Intravenous  Once 09/22/19 0645 09/22/19 0904   09/22/19 0700  azithromycin (ZITHROMAX) 500 mg in sodium chloride 0.9 % 250 mL IVPB     500 mg 250 mL/hr over 60 Minutes Intravenous  Once 09/22/19 0645 09/22/19 1017        Objective:   Vitals:   09/27/19 1450 09/27/19 1500 09/27/19 1600 09/27/19 1645  BP: 120/64  (!) 155/84   Pulse: 90 95 90    Resp: (!) 24 20 20    Temp:    98 F (36.7 C)  TempSrc:    Oral  SpO2: 94% 96% 98%   Weight:      Height:        Wt Readings from Last 3 Encounters:  09/26/19 98.1 kg  06/05/19 99.8 kg  05/06/19 98.9 kg     Intake/Output Summary (Last 24 hours) at 09/27/2019 1712 Last data filed at 09/27/2019 1230 Gross per 24 hour  Intake 897.48 ml  Output -  Net 897.48 ml     Physical Exam  General exam: Alert, awake, oriented x 3 Respiratory system: Clear to auscultation. Respiratory effort normal. Cardiovascular system:irregular. No murmurs, rubs, gallops. Gastrointestinal system: Abdomen is nondistended, soft and nontender. No organomegaly or masses felt. Normal bowel sounds heard. Central nervous system: Alert and oriented. No focal neurological deficits. Extremities: No C/C/E, +pedal pulses Skin: No rashes, lesions or ulcers Psychiatry: Judgement and insight appear normal. Mood & affect appropriate.     Data Review:   Micro Results Recent Results (from the past 240 hour(s))  Respiratory Panel by RT PCR (Flu A&B, Covid) - Nasopharyngeal Swab     Status: None   Collection Time: 09/22/19  5:37 AM   Specimen: Nasopharyngeal Swab  Result Value Ref Range Status   SARS Coronavirus 2 by RT PCR NEGATIVE NEGATIVE Final    Comment: (NOTE) SARS-CoV-2 target nucleic acids are NOT DETECTED. The SARS-CoV-2 RNA is generally detectable in upper respiratoy specimens during the acute phase of infection. The lowest concentration of SARS-CoV-2 viral copies this assay can detect is 131 copies/mL. A negative result does not preclude SARS-Cov-2 infection and should not be used as the sole basis for treatment or other patient management decisions. A negative result may occur with  improper specimen collection/handling, submission of specimen other than nasopharyngeal swab, presence of viral mutation(s) within the areas targeted by this assay, and inadequate number of viral copies (<131  copies/mL). A negative result must be combined with clinical observations, patient history, and epidemiological information. The expected result is Negative. Fact Sheet for Patients:  PinkCheek.be Fact Sheet for Healthcare Providers:  GravelBags.it This test is not yet ap proved or cleared by the [place] FDA and  has been authorized for detection and/or diagnosis of SARS-CoV-2 by FDA under an Emergency Use Authorization (EUA). This EUA will remain  in effect (meaning this test can be used) for the duration of the COVID-19 declaration under  Section 564(b)(1) of the Act, 21 U.S.C. section 360bbb-3(b)(1), unless the authorization is terminated or revoked sooner.    Influenza A by PCR NEGATIVE NEGATIVE Final   Influenza B by PCR NEGATIVE NEGATIVE Final    Comment: (NOTE) The Xpert Xpress SARS-CoV-2/FLU/RSV assay is intended as an aid in  the diagnosis of influenza from Nasopharyngeal swab specimens and  should not be used as a sole basis for treatment. Nasal washings and  aspirates are unacceptable for Xpert Xpress SARS-CoV-2/FLU/RSV  testing. Fact Sheet for Patients: PinkCheek.be Fact Sheet for Healthcare Providers: GravelBags.it This test is not yet approved or cleared by the [place] FDA and  has been authorized for detection and/or diagnosis of SARS-CoV-2 by  FDA under an Emergency Use Authorization (EUA). This EUA will remain  in effect (meaning this test can be used) for the duration of the  Covid-19 declaration under Section 564(b)(1) of the Act, 21  U.S.C. section 360bbb-3(b)(1), unless the authorization is  terminated or revoked. Performed at [facility], [address] [place], [place] [address]   Blood culture (routine x 2)     Status: None   Collection Time: 09/22/19  6:32 AM   Specimen: BLOOD  Result Value Ref Range Status   Specimen  Description BLOOD LEFT HAND  Final   Special Requests   Final    BOTTLES DRAWN AEROBIC AND ANAEROBIC Blood Culture adequate volume   Culture   Final    NO GROWTH 5 DAYS Performed at [facility], [address], [place], [place] [address]    Report Status 09/27/2019 FINAL  Final  Blood culture (routine x 2)     Status: None   Collection Time: 09/22/19  6:39 AM   Specimen: BLOOD  Result Value Ref Range Status   Specimen Description BLOOD LEFT HAND  Final   Special Requests   Final    BOTTLES DRAWN AEROBIC AND ANAEROBIC Blood Culture adequate volume   Culture   Final    NO GROWTH 5 DAYS Performed at [facility], [address], [place], [place] [address]    Report Status 09/27/2019 FINAL  Final  MRSA PCR Screening     Status: None   Collection Time: 09/22/19  8:45 PM   Specimen: Nasal Mucosa; Nasopharyngeal  Result Value Ref Range Status   MRSA by PCR NEGATIVE NEGATIVE Final    Comment:        The GeneXpert MRSA Assay (FDA approved for NASAL specimens only), is one component of a comprehensive MRSA colonization surveillance program. It is not intended to diagnose MRSA infection nor to guide or monitor treatment for MRSA infections. Performed at [facility], [address], [place], [place] [address]     Radiology Reports DG Chest 2 View  Result Date: 09/27/2019 CLINICAL DATA:  Dyspnea. EXAM: CHEST - 2 VIEW COMPARISON:  09/25/2019 FINDINGS: Lungs are adequately inflated demonstrate persistent bilateral patchy airspace opacification worse over the left base. Possible left effusion. Mild stable cardiomegaly. Remainder of the exam is unchanged. IMPRESSION: Stable bilateral patchy airspace process worse over the left base with possible left effusion. Findings likely due to multifocal infection. Stable cardiomegaly. Electronically Signed   By: Marin Olp M.D.   On: 09/27/2019 09:28   DG Chest Port 1 View  Result Date: 09/25/2019 CLINICAL DATA:  79 year old with shortness of  breath. Follow-up pneumonia. Current history of end-stage renal disease on hemodialysis. Negative COVID-19 test on 09/22/2019. EXAM: PORTABLE CHEST 1 VIEW COMPARISON:  09/22/2019 and earlier, including CT  chest 04/20/2019. FINDINGS: Cardiac silhouette moderately to markedly enlarged, unchanged. Airspace opacities throughout both lungs, most confluent in the LEFT LOWER LOBE, worse than on the examination 3 days ago. IMPRESSION: Worsening bilateral airspace opacities, most confluent in the LEFT LOWER LOBE. Pneumonia is favored over pulmonary edema. Electronically Signed   By: Evangeline Dakin M.D.   On: 09/25/2019 10:18   DG Chest Port 1 View  Result Date: 09/22/2019 CLINICAL DATA:  Shortness of breath EXAM: PORTABLE CHEST 1 VIEW COMPARISON:  04/20/2019 FINDINGS: Patchy bilateral airspace opacity most dense in the peripheral left lung. Cardiomegaly. No effusion or pneumothorax. IMPRESSION: 1. Patchy bilateral pneumonia. 2. Chronic cardiomegaly. Electronically Signed   By: Monte Fantasia M.D.   On: 09/22/2019 05:55   ECHOCARDIOGRAM COMPLETE  Result Date: 09/27/2019    ECHOCARDIOGRAM REPORT   Patient Name:   MINELA BRIDGEWATER Date of Exam: 09/27/2019 Medical Rec #:  [id number]      Height:       64.0 in Accession #:    [id number]     Weight:       216.3 lb Date of Birth:  [birth year]      BSA:          2.022 m Patient Age:    57 years       BP:           117/69 mmHg Patient Gender: F              HR:           91 bpm. Exam Location:  [facility] Procedure: 2D Echo, Cardiac Doppler and Color Doppler Indications:    Atrial Fibrillation 427.31 / I48.91  History:        Patient has prior history of Echocardiogram examinations, most                 recent 08/22/2018. CHF, Arrythmias:Atrial Fibrillation; Risk                 Factors:Hypertension and Dyslipidemia. Chronic systolic heart                 failure,ESRD (end stage renal disease),Long term current use of                 anticoagulant therapy.  Sonographer:     Alvino Chapel RCS Referring Phys: [place]  1. Left ventricular ejection fraction, by estimation, is 40 to 45%. The left ventricle has mildly decreased function. The left ventricle demonstrates global hypokinesis with regional variation, septal paradox/dyskinesis. The left ventricular internal cavity size was mildly to moderately dilated. There is mild left ventricular hypertrophy. Left ventricular diastolic parameters are indeterminate in the setting of atrial fibrillation.  2. Right ventricular systolic function is normal. The right ventricular size is normal. There is moderately elevated pulmonary artery systolic pressure. The estimated right ventricular systolic pressure is 28.3 mmHg.  3. Left atrial size was severely dilated.  4. Right atrial size was severely dilated.  5. The mitral valve is degenerative, mildly thickened and with mild to moderate annular calcification. Moderate mitral valve regurgitation.  6. Tricuspid valve regurgitation is moderate.  7. The aortic valve is tricuspid. Aortic valve regurgitation is not visualized. Mild to moderate aortic valve sclerosis/calcification is present, versus very mild aortic stenosis.  8. The inferior vena cava is dilated in size with <50% respiratory variability, suggesting right atrial pressure of 15 mmHg. FINDINGS  Left Ventricle: Left ventricular ejection fraction, by estimation, is  40 to 45%. The left ventricle has mildly decreased function. The left ventricle demonstrates global hypokinesis. The left ventricular internal cavity size was mildly to moderately dilated. There is mild left ventricular hypertrophy. Abnormal (paradoxical) septal motion, consistent with left bundle branch block. Left ventricular diastolic parameters are indeterminate. Right Ventricle: The right ventricular size is normal. No increase in right ventricular wall thickness. Right ventricular systolic function is normal. There is moderately elevated pulmonary  artery systolic pressure. The tricuspid regurgitant velocity is 2.92 m/s, and with an assumed right atrial pressure of 15 mmHg, the estimated right ventricular systolic pressure is 18.5 mmHg. Left Atrium: Left atrial size was severely dilated. Right Atrium: Right atrial size was severely dilated. Pericardium: There is no evidence of pericardial effusion. Presence of pericardial fat pad. Mitral Valve: The mitral valve is degenerative in appearance. There is mild thickening of the mitral valve leaflet(s). Mild to moderate mitral annular calcification. Moderate mitral valve regurgitation. Tricuspid Valve: The tricuspid valve is grossly normal. Tricuspid valve regurgitation is moderate. Aortic Valve: The aortic valve is tricuspid. Aortic valve regurgitation is not visualized. Mild to moderate aortic valve sclerosis/calcification is present, without any evidence of aortic stenosis. Pulmonic Valve: The pulmonic valve was grossly normal. Pulmonic valve regurgitation is trivial. Aorta: The aortic root is normal in size and structure. Venous: The inferior vena cava is dilated in size with less than 50% respiratory variability, suggesting right atrial pressure of 15 mmHg. IAS/Shunts: No atrial level shunt detected by color flow Doppler.  LEFT VENTRICLE PLAX 2D LVIDd:         6.22 cm LVIDs:         3.83 cm LV PW:         1.11 cm LV IVS:        1.19 cm LVOT diam:     1.90 cm LV SV:         56.99 ml LV SV Index:   28.18 LVOT Area:     2.84 cm  LV Volumes (MOD) LV vol d, MOD A2C: 176.0 ml LV vol d, MOD A4C: 140.0 ml LV vol s, MOD A2C: 77.2 ml LV vol s, MOD A4C: 80.6 ml LV SV MOD A2C:     98.8 ml LV SV MOD A4C:     140.0 ml LV SV MOD BP:      80.9 ml RIGHT VENTRICLE TAPSE (M-mode): 1.2 cm LEFT ATRIUM              Index       RIGHT ATRIUM           Index LA diam:        4.20 cm  2.08 cm/m  RA Area:     29.30 cm LA Vol (A2C):   100.0 ml 49.44 ml/m RA Volume:   111.00 ml 54.88 ml/m LA Vol (A4C):   129.0 ml 63.78 ml/m LA Biplane  Vol: 126.0 ml 62.30 ml/m  AORTIC VALVE LVOT Vmax:   104.00 cm/s LVOT Vmean:  71.800 cm/s LVOT VTI:    0.201 m  AORTA Ao Root diam: 3.20 cm MITRAL VALVE                 TRICUSPID VALVE MV Area (PHT): 5.27 cm      TR Peak grad:   34.1 mmHg MV Decel Time: 144 msec      TR Vmax:        292.00 cm/s MR Peak grad:    69.9 mmHg MR Mean grad:  48.0 mmHg   SHUNTS MR Vmax:         418.00 cm/s Systemic VTI:  0.20 m MR Vmean:        327.0 cm/s  Systemic Diam: 1.90 cm MR PISA:         1.01 cm MR PISA Eff ROA: 6 mm MR PISA Radius:  0.40 cm MV E velocity: 148.00 cm/s Rozann Lesches MD Electronically signed by Rozann Lesches MD Signature Date/Time: 09/27/2019/2:42:43 PM    Final      CBC Recent Labs  Lab 09/22/19 0530 09/23/19 0535 09/26/19 0405  WBC 11.0* 9.9 7.9  HGB 11.6* 10.7* 10.4*  HCT 35.7* 32.9* 31.9*  PLT 171 155 171  MCV 114.4* 113.4* 114.3*  MCH 37.2* 36.9* 37.3*  MCHC 32.5 32.5 32.6  RDW 14.7 14.8 14.5  LYMPHSABS 2.4  --   --   MONOABS 0.9  --   --   EOSABS 0.1  --   --   BASOSABS 0.1  --   --     Chemistries  Recent Labs  Lab 09/22/19 0530 09/22/19 1859 09/23/19 0535 09/24/19 0402 09/25/19 0356 09/26/19 0405  NA 137  --  136 137 136 136  K 4.7  --  4.5 5.1 4.4 4.5  CL 96*  --  91* 91* 88* 89*  CO2 21*  --  25 24 26 25   GLUCOSE 165*  --  134* 138* 122* 112*  BUN 62*  --  40* 63* 42* 66*  CREATININE 12.94*  --  8.79* 11.04* 7.42* 9.62*  CALCIUM 10.0  --  9.6 9.8 10.0 9.6  MG  --  1.8 1.9  --   --   --   AST  --   --  21  --   --   --   ALT  --   --  21  --   --   --   ALKPHOS  --   --  63  --   --   --   BILITOT  --   --  1.0  --   --   --    ------------------------------------------------------------------------------------------------------------------ No results for input(s): CHOL, HDL, LDLCALC, TRIG, CHOLHDL, LDLDIRECT in the last 72 hours.  No results found for: HGBA1C  ------------------------------------------------------------------------------------------------------------------ No results for input(s): TSH, T4TOTAL, T3FREE, THYROIDAB in the last 72 hours.  Invalid input(s): FREET3 ------------------------------------------------------------------------------------------------------------------ No results for input(s): VITAMINB12, FOLATE, FERRITIN, TIBC, IRON, RETICCTPCT in the last 72 hours.  Coagulation profile Recent Labs  Lab 09/23/19 0535 09/24/19 0402 09/25/19 0356 09/26/19 0405 09/27/19 0525  INR 2.2* 1.8* 2.1* 2.5* 2.4*    No results for input(s): DDIMER in the last 72 hours.  Cardiac Enzymes No results for input(s): CKMB, TROPONINI, MYOGLOBIN in the last 168 hours.  Invalid input(s): CK ------------------------------------------------------------------------------------------------------------------    Component Value Date/Time   BNP 344.0 (H) 04/08/2019 1600    Kathie Dike M.D on 09/27/2019 at 5:12 PM  Go to [email or web address] - for contact info  [facility] - Office  [phone]

## 2019-09-27 NOTE — Progress Notes (Signed)
*  PRELIMINARY RESULTS* Echocardiogram 2D Echocardiogram has been performed.  Sabrina Beck 09/27/2019, 2:20 PM

## 2019-09-28 LAB — CBC
HCT: 30.5 % — ABNORMAL LOW (ref 36.0–46.0)
Hemoglobin: 10.1 g/dL — ABNORMAL LOW (ref 12.0–15.0)
MCH: 37.4 pg — ABNORMAL HIGH (ref 26.0–34.0)
MCHC: 33.1 g/dL (ref 30.0–36.0)
MCV: 113 fL — ABNORMAL HIGH (ref 80.0–100.0)
Platelets: 186 10*3/uL (ref 150–400)
RBC: 2.7 MIL/uL — ABNORMAL LOW (ref 3.87–5.11)
RDW: 14.4 % (ref 11.5–15.5)
WBC: 8.1 10*3/uL (ref 4.0–10.5)
nRBC: 0 % (ref 0.0–0.2)

## 2019-09-28 LAB — PROTIME-INR
INR: 2.3 — ABNORMAL HIGH (ref 0.8–1.2)
Prothrombin Time: 25.2 seconds — ABNORMAL HIGH (ref 11.4–15.2)

## 2019-09-28 LAB — BASIC METABOLIC PANEL
Anion gap: 20 — ABNORMAL HIGH (ref 5–15)
BUN: 58 mg/dL — ABNORMAL HIGH (ref 8–23)
CO2: 25 mmol/L (ref 22–32)
Calcium: 9.8 mg/dL (ref 8.9–10.3)
Chloride: 90 mmol/L — ABNORMAL LOW (ref 98–111)
Creatinine, Ser: 8.56 mg/dL — ABNORMAL HIGH (ref 0.44–1.00)
GFR calc Af Amer: 5 mL/min — ABNORMAL LOW (ref >60)
GFR calc non Af Amer: 4 mL/min — ABNORMAL LOW (ref >60)
Glucose, Bld: 99 mg/dL (ref 70–99)
Potassium: 3.9 mmol/L (ref 3.5–5.1)
Sodium: 135 mmol/L (ref 135–145)

## 2019-09-28 MED ORDER — WARFARIN SODIUM 5 MG PO TABS
5.0000 mg | ORAL_TABLET | Freq: Once | ORAL | Status: AC
  Administered 2019-09-28: 5 mg via ORAL
  Filled 2019-09-28: qty 1

## 2019-09-28 NOTE — Progress Notes (Signed)
ANTICOAGULATION CONSULT NOTE -  Pharmacy Consult for warfarin Indication: atrial fibrillation  No Known Allergies  Patient Measurements: Height: 5\' 4"  (162.6 cm) Weight: 216 lb 4.3 oz (98.1 kg) IBW/kg (Calculated) : 54.7   Vital Signs: Temp: 98.3 F (36.8 C) (02/21 0000) BP: 111/72 (02/21 0500) Pulse Rate: 88 (02/21 0500)  Labs: Recent Labs    09/26/19 0405 09/27/19 0525 09/28/19 0519  HGB 10.4*  --  10.1*  HCT 31.9*  --  30.5*  PLT 171  --  186  LABPROT 26.8* 26.1* 25.2*  INR 2.5* 2.4* 2.3*  CREATININE 9.62*  --  8.56*    Estimated Creatinine Clearance: 6 mL/min (A) (by C-G formula based on SCr of 8.56 mg/dL (H)).   Medical History: Past Medical History:  Diagnosis Date  . Anemia   . Atrial fibrillation ([place])   . Chest pain 05/15/2006   Stress test negative for ischemia  . Diastolic heart failure ([place])   . Diverticulosis   . ESRD on hemodialysis ([place])   . GERD (gastroesophageal reflux disease)   . Headache   . Mixed hyperlipidemia   . Peripheral neuropathy   . PONV (postoperative nausea and vomiting)   . Systemic hypertension   . Uterine cancer ([facility])     Medications:  Medications Prior to Admission  Medication Sig Dispense Refill Last Dose  . acetaminophen (TYLENOL) 500 MG tablet Take 1,000 mg by mouth every 6 (six) hours as needed for moderate pain.    Past Week at Unknown time  . albuterol (VENTOLIN HFA) 108 (90 Base) MCG/ACT inhaler Inhale 2 puffs into the lungs every 4 (four) hours as needed for wheezing or shortness of breath (cough, shortness of breath or wheezing.). 18 g 1 09/22/2019 at Unknown time  . Cholecalciferol (VITAMIN D) 2000 UNITS CAPS Take 2,000 Units by mouth every evening.    09/21/2019 at Unknown time  . fluticasone (FLOVENT HFA) 44 MCG/ACT inhaler Inhale 2 puffs into the lungs 2 (two) times daily. 1 Inhaler 2 Past Month at Unknown time  . gabapentin (NEURONTIN) 100 MG capsule Take 100 mg by mouth 2 (two) times daily.    09/21/2019 at  Unknown time  . metoprolol tartrate (LOPRESSOR) 25 MG tablet Take 1 tablet (25 mg total) by mouth 2 (two) times daily. 60 tablet 1 09/21/2019 at 1830  . Multiple Vitamins-Minerals (PRESERVISION AREDS 2 PO) Take 1 tablet by mouth 2 (two) times daily.   09/21/2019 at Unknown time  . multivitamin (RENA-VIT) TABS tablet Take 1 tablet by mouth every evening.    09/21/2019 at Unknown time  . Omega-3 Fatty Acids (OMEGA-3 FISH OIL) 300 MG CAPS Take 300 mg by mouth 2 (two) times daily.   09/21/2019 at Unknown time  . omeprazole (PRILOSEC) 20 MG capsule Take 20 mg by mouth every morning.    09/21/2019 at Unknown time  . Propylene Glycol (SYSTANE BALANCE) 0.6 % SOLN Place 1 drop into both eyes 3 (three) times daily as needed (for burning or dry eyes).    Past Month at Unknown time  . RENVELA 800 MG tablet Take 2,400 mg by mouth 3 (three) times daily with meals. Takes 1 tablet with snacks   09/21/2019 at Unknown time  . warfarin (COUMADIN) 5 MG tablet TAKE 1 & 1/2 TABLETS BY MOUTH (ONE & ONE-HALF) TO 2 TABLETS ONCE DAILY AS DIRECTED BY COUMADIN CLINIC (Patient taking differently: Take 7.5-10 mg by mouth daily at 6 PM. TAKE 2 TABLET BY MOUTH ON Monday AND 1 1/2  TABLETS BY MOUTH (ONE & ONE-HALF) ON Tuesday, Wednesday, Thursday,  Friday, Saturday, Sunday.) 150 tablet 0 09/21/2019 at 1830  . predniSONE (STERAPRED UNI-PAK 21 TAB) 10 MG (21) TBPK tablet Take by mouth daily. Take 6 tabs by mouth daily  for 2 days, then 5 tabs for 2 days, then 4 tabs for 2 days, then 3 tabs for 2 days, 2 tabs for 2 days, then 1 tab by mouth daily for 2 days (Patient not taking: Reported on 09/22/2019) 42 tablet 0 Not Taking at Unknown time    Assessment: Pharmacy consulted to dose warfarin in patient with atrial fibrillation.   Coumadin 10mg  dose ordered 2/15 not given. Patient now started on amiodarone, will have to be careful with dosing coumadin d/t drug interaction. INR is now therapeutic at 2.1> 2.5>2.4>2.3   Home dose listed as 10 mg  every Mon and 7.5 mg ROW per anticoag clinic.  Goal of Therapy:  INR 2-3 Monitor platelets by anticoagulation protocol: Yes   Plan:  Warfarin 5 mg x 1 dose. Lower dose due to new amiodarone Monitor daily INR and s/s of bleeding.  Thomasenia Sales, PharmD, MBA, BCGP Clinical Pharmacist  09/28/2019 7:21 AM

## 2019-09-28 NOTE — Progress Notes (Signed)
Patient Demographics:    Sabrina Beck, is a 79 y.o. female, DOB - 16-Jun-1941, ZOX:[id number]  Admit date - 09/22/2019   Admitting Physician Courage Denton Brick, MD  Outpatient Primary MD for the patient is Redmond School, MD  LOS - 6   Chief Complaint  Patient presents with  . Shortness of Breath        Subjective:    Sabrina Beck she is feeling better.  Shortness of breath improving.  No longer having regurgitation symptoms.  Overall heart rates have improved into the 80s.  Still on amiodarone infusion.  Assessment  & Plan :    Principal Problem:   PNA (pneumonia) Active Problems:   Anemia   Atrial fibrillation (HCC)   Long term current use of anticoagulant therapy   ESRD (end stage renal disease) (HCC)   Sepsis (HCC)   Atrial fibrillation with rapid ventricular response (HCC)   Chronic systolic heart failure [facility])  Brief Summary 79 y.o. female with past medical history relevant for chronic A. fib on Coumadin for anticoagulation and metoprolol for rate control, as well as history of ESRD with hemodialysis on Mondays Wednesdays and Fridays, COPD, dCHF and anemia of ESRD admitted on 09/22/2019 with new onset acute hypoxic respiratory failure in the setting of sepsis due to bilateral pneumonia and A. fib with RVR (not new)  A/p 1)Bilateral community-acquired Pneumonia with Acute hypoxic respiratory failure--- hypoxia is new this admission, -Now requiring 3 L of oxygen via nasal cannula down from 5 L -Cough and dyspnea improving -No fevers or leukocytosis -Overall sepsis pathophysiology appears to have resolved -Complete azithromycin on 09/27/2019 -Okay to continue IV Rocephin thru 09/29/19 - c/n bronchodilators and mucolytics as ordered   2)Chronic Afib --- with RVR, last known EF 45%, patient was initially treated with IV Cardizem became hypotensive, Cardizem was discontinued, tachycardia  reoccurred, IV Cardizem was tried again patient became hypotensive again -Tachycardia persisted on IV amiodarone and metoprolol---  -Cardiology consult appreciated,  -Treated with IV amiodarone and IV esmolol drip given persistent challenges with rate control -TSH is 4.3,  -Coumadin for anticoagulation,  INR 2.4 -Overall heart rate began to stabilize -She was started on oral Lopressor and esmolol drip was weaned off. -Since that time, heart rate has improved into the 70s to 80s range. -Discussed with Dr. Domenic Polite with recommendations to discontinue further amiodarone. -Blood pressure currently stable and if tachycardia recurs, would suggest giving 1 dose of IV Lopressor and increasing oral dosing to 50 mg twice daily.  3)Sepsis secondary to pneumonia in a hemodialysis patient--- we avoided giving full amount of required IV fluid boluses due to hemodialysis/ESRD status -Sepsis pathophysiology appears to be resolving, no fevers, no leukocytosis, oxygen requirement is improving -Repeat chest x-ray on 09/27/2019, shows stable findings of bilateral patchy airspace process, worse over left base -Hemodynamics have stabilized -No fever -Continue antibiotics.  4)ESRD--nephrology consult for HD appreciated patient usually gets HD Monday Wednesdays and Fridays, she has not missed any HD sessions --Last HD treatment 09/26/2019 -continue midodrine  5)-anemia of CKD--- hemoglobin is stable , EPO/ESA agent per nephrology team  6) acute hypoxic respiratory failure secondary to #1 #2 above--- currently requiring oxygen at 3L, hopefully hypoxia will improve with hemodialysis and treatment of pneumonia , as well  as with better rate control as above #1  7)HFrEF--acute on chronic combined systolic and diastolic dysfunction CHF--- fluid balance remains negative, - weight appears not to be accurate, -Continue attempts at rate control as above in #2,  continue to use hemodialysis to address volume  status, -Cardiology input appreciated -Echo shows EF of 40-45% which is similar to Echo from 08/2018  Disposition/Need for in-Hospital Stay- patient unable to be discharged at this time due to --pneumonia requiring IV antibiotics, A. fib with RVR requiring IV amiodarone drip for rate control,  -Remains hypoxic with oxygen -Not medically ready for discharge home at this time  Code Status : full  Family Communication: discussed with patient and son at the bedside  Consults  : Cardiology/nephrology  DVT Prophylaxis  : Coumadin- SCDs   Lab Results  Component Value Date   PLT 186 09/28/2019   Inpatient Medications  Scheduled Meds: . budesonide  0.5 mg Inhalation BID  . Chlorhexidine Gluconate Cloth  6 each Topical Q0600  . Chlorhexidine Gluconate Cloth  6 each Topical Q0600  . cholecalciferol  2,000 Units Oral QPM  . gabapentin  100 mg Oral BID  . guaiFENesin  600 mg Oral BID  . levalbuterol  0.63 mg Nebulization TID  . metoprolol tartrate  25 mg Oral BID  . midodrine  5 mg Oral TID WC  . multivitamin  1 tablet Oral QPM  . multivitamin-lutein  1 capsule Oral BID  . omega-3 acid ethyl esters  1,000 mg Oral BID  . pantoprazole  40 mg Oral BID AC  . sevelamer carbonate  2,400 mg Oral TID WC  . sodium chloride flush  3 mL Intravenous Q12H  . warfarin  5 mg Oral ONCE-1800  . Warfarin - Pharmacist Dosing Inpatient   Does not apply q1800   Continuous Infusions: . sodium chloride 250 mL (09/23/19 0820)  . sodium chloride    . sodium chloride    . albumin human    . cefTRIAXone (ROCEPHIN)  IV 1 g (09/28/19 0942)  . esmolol Stopped (09/26/19 1212)   PRN Meds:.sodium chloride, sodium chloride, sodium chloride, acetaminophen **OR** acetaminophen, guaiFENesin-dextromethorphan, lidocaine (PF), lidocaine-prilocaine, ondansetron (ZOFRAN) IV, ondansetron **OR** ondansetron (ZOFRAN) IV, pentafluoroprop-tetrafluoroeth, polyethylene glycol, polyvinyl alcohol, sodium chloride flush,  traZODone    Anti-infectives (From admission, onward)   Start     Dose/Rate Route Frequency Ordered Stop   09/23/19 1100  vancomycin (VANCOCIN) IVPB 1000 mg/200 mL premix     1,000 mg 200 mL/hr over 60 Minutes Intravenous  Once 09/23/19 1049 09/23/19 1233   09/23/19 1000  azithromycin (ZITHROMAX) 500 mg in sodium chloride 0.9 % 250 mL IVPB     500 mg 250 mL/hr over 60 Minutes Intravenous Every 24 hours 09/22/19 1018 09/27/19 1214   09/23/19 1000  cefTRIAXone (ROCEPHIN) 1 g in sodium chloride 0.9 % 100 mL IVPB     1 g 200 mL/hr over 30 Minutes Intravenous Every 24 hours 09/22/19 1018     09/22/19 1800  vancomycin (VANCOCIN) IVPB 1000 mg/200 mL premix  Status:  Discontinued     1,000 mg 200 mL/hr over 60 Minutes Intravenous Every M-W-F (Hemodialysis) 09/22/19 1300 09/23/19 1049   09/22/19 1030  cefTRIAXone (ROCEPHIN) 1 g in sodium chloride 0.9 % 100 mL IVPB  Status:  Discontinued     1 g 200 mL/hr over 30 Minutes Intravenous Every 24 hours 09/22/19 1015 09/22/19 1018   09/22/19 1030  azithromycin (ZITHROMAX) 500 mg in sodium chloride 0.9 % 250 mL  IVPB  Status:  Discontinued     500 mg 250 mL/hr over 60 Minutes Intravenous Every 24 hours 09/22/19 1015 09/22/19 1018   09/22/19 0715  vancomycin (VANCOREADY) IVPB 1500 mg/300 mL     1,500 mg 150 mL/hr over 120 Minutes Intravenous  Once 09/22/19 0704 09/22/19 1600   09/22/19 0700  cefTRIAXone (ROCEPHIN) 1 g in sodium chloride 0.9 % 100 mL IVPB     1 g 200 mL/hr over 30 Minutes Intravenous  Once 09/22/19 0645 09/22/19 0904   09/22/19 0700  azithromycin (ZITHROMAX) 500 mg in sodium chloride 0.9 % 250 mL IVPB     500 mg 250 mL/hr over 60 Minutes Intravenous  Once 09/22/19 0645 09/22/19 1017        Objective:   Vitals:   09/28/19 1100 09/28/19 1300 09/28/19 1343 09/28/19 1400  BP: 125/74 134/70  139/75  Pulse: 89 64  84  Resp: 20 18  20   Temp:  98 F (36.7 C)    TempSrc:  Oral    SpO2: 97% 95% 97% 91%  Weight:      Height:         Wt Readings from Last 3 Encounters:  09/26/19 98.1 kg  06/05/19 99.8 kg  05/06/19 98.9 kg     Intake/Output Summary (Last 24 hours) at 09/28/2019 1609 Last data filed at 09/28/2019 1400 Gross per 24 hour  Intake 1322.51 ml  Output --  Net 1322.51 ml     Physical Exam  General exam: Alert, awake, oriented x 3 Respiratory system: coarse breath sounds at bases. Respiratory effort normal. Cardiovascular system:irregular. No murmurs, rubs, gallops. Gastrointestinal system: Abdomen is nondistended, soft and nontender. No organomegaly or masses felt. Normal bowel sounds heard. Central nervous system: Alert and oriented. No focal neurological deficits. Extremities: No C/C/E, +pedal pulses Skin: No rashes, lesions or ulcers Psychiatry: Judgement and insight appear normal. Mood & affect appropriate.     Data Review:   Micro Results Recent Results (from the past 240 hour(s))  Respiratory Panel by RT PCR (Flu A&B, Covid) - Nasopharyngeal Swab     Status: None   Collection Time: 09/22/19  5:37 AM   Specimen: Nasopharyngeal Swab  Result Value Ref Range Status   SARS Coronavirus 2 by RT PCR NEGATIVE NEGATIVE Final    Comment: (NOTE) SARS-CoV-2 target nucleic acids are NOT DETECTED. The SARS-CoV-2 RNA is generally detectable in upper respiratoy specimens during the acute phase of infection. The lowest concentration of SARS-CoV-2 viral copies this assay can detect is 131 copies/mL. A negative result does not preclude SARS-Cov-2 infection and should not be used as the sole basis for treatment or other patient management decisions. A negative result may occur with  improper specimen collection/handling, submission of specimen other than nasopharyngeal swab, presence of viral mutation(s) within the areas targeted by this assay, and inadequate number of viral copies (<131 copies/mL). A negative result must be combined with clinical observations, patient history, and epidemiological  information. The expected result is Negative. Fact Sheet for Patients:  PinkCheek.be Fact Sheet for Healthcare Providers:  GravelBags.it This test is not yet ap proved or cleared by the [place] FDA and  has been authorized for detection and/or diagnosis of SARS-CoV-2 by FDA under an Emergency Use Authorization (EUA). This EUA will remain  in effect (meaning this test can be used) for the duration of the COVID-19 declaration under Section 564(b)(1) of the Act, 21 U.S.C. section 360bbb-3(b)(1), unless the authorization is terminated or revoked sooner.  Influenza A by PCR NEGATIVE NEGATIVE Final   Influenza B by PCR NEGATIVE NEGATIVE Final    Comment: (NOTE) The Xpert Xpress SARS-CoV-2/FLU/RSV assay is intended as an aid in  the diagnosis of influenza from Nasopharyngeal swab specimens and  should not be used as a sole basis for treatment. Nasal washings and  aspirates are unacceptable for Xpert Xpress SARS-CoV-2/FLU/RSV  testing. Fact Sheet for Patients: PinkCheek.be Fact Sheet for Healthcare Providers: GravelBags.it This test is not yet approved or cleared by the [place] FDA and  has been authorized for detection and/or diagnosis of SARS-CoV-2 by  FDA under an Emergency Use Authorization (EUA). This EUA will remain  in effect (meaning this test can be used) for the duration of the  Covid-19 declaration under Section 564(b)(1) of the Act, 21  U.S.C. section 360bbb-3(b)(1), unless the authorization is  terminated or revoked. Performed at [facility], [address], [place], [place] [address]   Blood culture (routine x 2)     Status: None   Collection Time: 09/22/19  6:32 AM   Specimen: BLOOD  Result Value Ref Range Status   Specimen Description BLOOD LEFT HAND  Final   Special Requests   Final    BOTTLES DRAWN AEROBIC AND ANAEROBIC Blood Culture  adequate volume   Culture   Final    NO GROWTH 5 DAYS Performed at [facility], [address], [place], [place] [address]    Report Status 09/27/2019 FINAL  Final  Blood culture (routine x 2)     Status: None   Collection Time: 09/22/19  6:39 AM   Specimen: BLOOD  Result Value Ref Range Status   Specimen Description BLOOD LEFT HAND  Final   Special Requests   Final    BOTTLES DRAWN AEROBIC AND ANAEROBIC Blood Culture adequate volume   Culture   Final    NO GROWTH 5 DAYS Performed at [facility], [address], [place], [place] [address]    Report Status 09/27/2019 FINAL  Final  MRSA PCR Screening     Status: None   Collection Time: 09/22/19  8:45 PM   Specimen: Nasal Mucosa; Nasopharyngeal  Result Value Ref Range Status   MRSA by PCR NEGATIVE NEGATIVE Final    Comment:        The GeneXpert MRSA Assay (FDA approved for NASAL specimens only), is one component of a comprehensive MRSA colonization surveillance program. It is not intended to diagnose MRSA infection nor to guide or monitor treatment for MRSA infections. Performed at [facility], [address] [place], [place] [address]     Radiology Reports DG Chest 2 View  Result Date: 09/27/2019 CLINICAL DATA:  Dyspnea. EXAM: CHEST - 2 VIEW COMPARISON:  09/25/2019 FINDINGS: Lungs are adequately inflated demonstrate persistent bilateral patchy airspace opacification worse over the left base. Possible left effusion. Mild stable cardiomegaly. Remainder of the exam is unchanged. IMPRESSION: Stable bilateral patchy airspace process worse over the left base with possible left effusion. Findings likely due to multifocal infection. Stable cardiomegaly. Electronically Signed   By: Marin Olp M.D.   On: 09/27/2019 09:28   DG Chest Port 1 View  Result Date: 09/25/2019 CLINICAL DATA:  79 year old with shortness of breath. Follow-up pneumonia. Current history of end-stage renal disease on hemodialysis. Negative COVID-19 test on  09/22/2019. EXAM: PORTABLE CHEST 1 VIEW COMPARISON:  09/22/2019 and earlier, including CT chest 04/20/2019. FINDINGS: Cardiac silhouette moderately to markedly enlarged, unchanged. Airspace opacities throughout both lungs, most confluent in the LEFT  LOWER LOBE, worse than on the examination 3 days ago. IMPRESSION: Worsening bilateral airspace opacities, most confluent in the LEFT LOWER LOBE. Pneumonia is favored over pulmonary edema. Electronically Signed   By: Evangeline Dakin M.D.   On: 09/25/2019 10:18   DG Chest Port 1 View  Result Date: 09/22/2019 CLINICAL DATA:  Shortness of breath EXAM: PORTABLE CHEST 1 VIEW COMPARISON:  04/20/2019 FINDINGS: Patchy bilateral airspace opacity most dense in the peripheral left lung. Cardiomegaly. No effusion or pneumothorax. IMPRESSION: 1. Patchy bilateral pneumonia. 2. Chronic cardiomegaly. Electronically Signed   By: Monte Fantasia M.D.   On: 09/22/2019 05:55   ECHOCARDIOGRAM COMPLETE  Result Date: 09/27/2019    ECHOCARDIOGRAM REPORT   Patient Name:   Sabrina Beck Date of Exam: 09/27/2019 Medical Rec #:  [id number]      Height:       64.0 in Accession #:    [id number]     Weight:       216.3 lb Date of Birth:  06-18-1941      BSA:          2.022 m Patient Age:    50 years       BP:           117/69 mmHg Patient Gender: F              HR:           91 bpm. Exam Location:  [facility] Procedure: 2D Echo, Cardiac Doppler and Color Doppler Indications:    Atrial Fibrillation 427.31 / I48.91  History:        Patient has prior history of Echocardiogram examinations, most                 recent 08/22/2018. CHF, Arrythmias:Atrial Fibrillation; Risk                 Factors:Hypertension and Dyslipidemia. Chronic systolic heart                 failure,ESRD (end stage renal disease),Long term current use of                 anticoagulant therapy.  Sonographer:    Alvino Chapel RCS Referring Phys: [place]  1. Left ventricular ejection fraction, by  estimation, is 40 to 45%. The left ventricle has mildly decreased function. The left ventricle demonstrates global hypokinesis with regional variation, septal paradox/dyskinesis. The left ventricular internal cavity size was mildly to moderately dilated. There is mild left ventricular hypertrophy. Left ventricular diastolic parameters are indeterminate in the setting of atrial fibrillation.  2. Right ventricular systolic function is normal. The right ventricular size is normal. There is moderately elevated pulmonary artery systolic pressure. The estimated right ventricular systolic pressure is 89.2 mmHg.  3. Left atrial size was severely dilated.  4. Right atrial size was severely dilated.  5. The mitral valve is degenerative, mildly thickened and with mild to moderate annular calcification. Moderate mitral valve regurgitation.  6. Tricuspid valve regurgitation is moderate.  7. The aortic valve is tricuspid. Aortic valve regurgitation is not visualized. Mild to moderate aortic valve sclerosis/calcification is present, versus very mild aortic stenosis.  8. The inferior vena cava is dilated in size with <50% respiratory variability, suggesting right atrial pressure of 15 mmHg. FINDINGS  Left Ventricle: Left ventricular ejection fraction, by estimation, is 40 to 45%. The left ventricle has mildly decreased function. The left ventricle demonstrates global hypokinesis. The left ventricular internal  cavity size was mildly to moderately dilated. There is mild left ventricular hypertrophy. Abnormal (paradoxical) septal motion, consistent with left bundle branch block. Left ventricular diastolic parameters are indeterminate. Right Ventricle: The right ventricular size is normal. No increase in right ventricular wall thickness. Right ventricular systolic function is normal. There is moderately elevated pulmonary artery systolic pressure. The tricuspid regurgitant velocity is 2.92 m/s, and with an assumed right atrial pressure  of 15 mmHg, the estimated right ventricular systolic pressure is 84.1 mmHg. Left Atrium: Left atrial size was severely dilated. Right Atrium: Right atrial size was severely dilated. Pericardium: There is no evidence of pericardial effusion. Presence of pericardial fat pad. Mitral Valve: The mitral valve is degenerative in appearance. There is mild thickening of the mitral valve leaflet(s). Mild to moderate mitral annular calcification. Moderate mitral valve regurgitation. Tricuspid Valve: The tricuspid valve is grossly normal. Tricuspid valve regurgitation is moderate. Aortic Valve: The aortic valve is tricuspid. Aortic valve regurgitation is not visualized. Mild to moderate aortic valve sclerosis/calcification is present, without any evidence of aortic stenosis. Pulmonic Valve: The pulmonic valve was grossly normal. Pulmonic valve regurgitation is trivial. Aorta: The aortic root is normal in size and structure. Venous: The inferior vena cava is dilated in size with less than 50% respiratory variability, suggesting right atrial pressure of 15 mmHg. IAS/Shunts: No atrial level shunt detected by color flow Doppler.  LEFT VENTRICLE PLAX 2D LVIDd:         6.22 cm LVIDs:         3.83 cm LV PW:         1.11 cm LV IVS:        1.19 cm LVOT diam:     1.90 cm LV SV:         56.99 ml LV SV Index:   28.18 LVOT Area:     2.84 cm  LV Volumes (MOD) LV vol d, MOD A2C: 176.0 ml LV vol d, MOD A4C: 140.0 ml LV vol s, MOD A2C: 77.2 ml LV vol s, MOD A4C: 80.6 ml LV SV MOD A2C:     98.8 ml LV SV MOD A4C:     140.0 ml LV SV MOD BP:      80.9 ml RIGHT VENTRICLE TAPSE (M-mode): 1.2 cm LEFT ATRIUM              Index       RIGHT ATRIUM           Index LA diam:        4.20 cm  2.08 cm/m  RA Area:     29.30 cm LA Vol (A2C):   100.0 ml 49.44 ml/m RA Volume:   111.00 ml 54.88 ml/m LA Vol (A4C):   129.0 ml 63.78 ml/m LA Biplane Vol: 126.0 ml 62.30 ml/m  AORTIC VALVE LVOT Vmax:   104.00 cm/s LVOT Vmean:  71.800 cm/s LVOT VTI:    0.201 m   AORTA Ao Root diam: 3.20 cm MITRAL VALVE                 TRICUSPID VALVE MV Area (PHT): 5.27 cm      TR Peak grad:   34.1 mmHg MV Decel Time: 144 msec      TR Vmax:        292.00 cm/s MR Peak grad:    69.9 mmHg MR Mean grad:    48.0 mmHg   SHUNTS MR Vmax:         418.00 cm/s Systemic  VTI:  0.20 m MR Vmean:        327.0 cm/s  Systemic Diam: 1.90 cm MR PISA:         1.01 cm MR PISA Eff ROA: 6 mm MR PISA Radius:  0.40 cm MV E velocity: 148.00 cm/s Rozann Lesches MD Electronically signed by Rozann Lesches MD Signature Date/Time: 09/27/2019/2:42:43 PM    Final      CBC Recent Labs  Lab 09/22/19 0530 09/23/19 0535 09/26/19 0405 09/28/19 0519  WBC 11.0* 9.9 7.9 8.1  HGB 11.6* 10.7* 10.4* 10.1*  HCT 35.7* 32.9* 31.9* 30.5*  PLT 171 155 171 186  MCV 114.4* 113.4* 114.3* 113.0*  MCH 37.2* 36.9* 37.3* 37.4*  MCHC 32.5 32.5 32.6 33.1  RDW 14.7 14.8 14.5 14.4  LYMPHSABS 2.4  --   --   --   MONOABS 0.9  --   --   --   EOSABS 0.1  --   --   --   BASOSABS 0.1  --   --   --     Chemistries  Recent Labs  Lab 09/22/19 0530 09/22/19 1859 09/23/19 0535 09/24/19 0402 09/25/19 0356 09/26/19 0405 09/28/19 0519  NA   < >  --  136 137 136 136 135  K   < >  --  4.5 5.1 4.4 4.5 3.9  CL   < >  --  91* 91* 88* 89* 90*  CO2   < >  --  25 24 26 25 25   GLUCOSE   < >  --  134* 138* 122* 112* 99  BUN   < >  --  40* 63* 42* 66* 58*  CREATININE   < >  --  8.79* 11.04* 7.42* 9.62* 8.56*  CALCIUM   < >  --  9.6 9.8 10.0 9.6 9.8  MG  --  1.8 1.9  --   --   --   --   AST  --   --  21  --   --   --   --   ALT  --   --  21  --   --   --   --   ALKPHOS  --   --  63  --   --   --   --   BILITOT  --   --  1.0  --   --   --   --    < > = values in this interval not displayed.   ------------------------------------------------------------------------------------------------------------------ No results for input(s): CHOL, HDL, LDLCALC, TRIG, CHOLHDL, LDLDIRECT in the last 72 hours.  No results found for:  HGBA1C ------------------------------------------------------------------------------------------------------------------ No results for input(s): TSH, T4TOTAL, T3FREE, THYROIDAB in the last 72 hours.  Invalid input(s): FREET3 ------------------------------------------------------------------------------------------------------------------ No results for input(s): VITAMINB12, FOLATE, FERRITIN, TIBC, IRON, RETICCTPCT in the last 72 hours.  Coagulation profile Recent Labs  Lab 09/24/19 0402 09/25/19 0356 09/26/19 0405 09/27/19 0525 09/28/19 0519  INR 1.8* 2.1* 2.5* 2.4* 2.3*    No results for input(s): DDIMER in the last 72 hours.  Cardiac Enzymes No results for input(s): CKMB, TROPONINI, MYOGLOBIN in the last 168 hours.  Invalid input(s): CK ------------------------------------------------------------------------------------------------------------------    Component Value Date/Time   BNP 344.0 (H) 04/08/2019 1600    Kathie Dike M.D on 09/28/2019 at 4:09 PM  Go to [email or web address] - for contact info  [facility] - Office  [phone]

## 2019-09-29 LAB — CBC
HCT: 30.8 % — ABNORMAL LOW (ref 36.0–46.0)
Hemoglobin: 10.1 g/dL — ABNORMAL LOW (ref 12.0–15.0)
MCH: 37.1 pg — ABNORMAL HIGH (ref 26.0–34.0)
MCHC: 32.8 g/dL (ref 30.0–36.0)
MCV: 113.2 fL — ABNORMAL HIGH (ref 80.0–100.0)
Platelets: 182 10*3/uL (ref 150–400)
RBC: 2.72 MIL/uL — ABNORMAL LOW (ref 3.87–5.11)
RDW: 14.6 % (ref 11.5–15.5)
WBC: 8.6 10*3/uL (ref 4.0–10.5)
nRBC: 0 % (ref 0.0–0.2)

## 2019-09-29 LAB — PROTIME-INR
INR: 2.2 — ABNORMAL HIGH (ref 0.8–1.2)
Prothrombin Time: 24.1 seconds — ABNORMAL HIGH (ref 11.4–15.2)

## 2019-09-29 LAB — RENAL FUNCTION PANEL
Albumin: 3.5 g/dL (ref 3.5–5.0)
BUN: 77 mg/dL — ABNORMAL HIGH (ref 8–23)
CO2: 26 mmol/L (ref 22–32)
Calcium: 9.5 mg/dL (ref 8.9–10.3)
Chloride: 89 mmol/L — ABNORMAL LOW (ref 98–111)
Creatinine, Ser: 10.95 mg/dL — ABNORMAL HIGH (ref 0.44–1.00)
GFR calc Af Amer: 3 mL/min — ABNORMAL LOW (ref >60)
GFR calc non Af Amer: 3 mL/min — ABNORMAL LOW (ref >60)
Glucose, Bld: 110 mg/dL — ABNORMAL HIGH (ref 70–99)
Phosphorus: 9.3 mg/dL — ABNORMAL HIGH (ref 2.5–4.6)
Potassium: 4.4 mmol/L (ref 3.5–5.1)
Sodium: 136 mmol/L (ref 135–145)

## 2019-09-29 MED ORDER — WARFARIN SODIUM 7.5 MG PO TABS
7.5000 mg | ORAL_TABLET | Freq: Once | ORAL | Status: AC
  Administered 2019-09-29: 7.5 mg via ORAL
  Filled 2019-09-29: qty 1

## 2019-09-29 NOTE — Progress Notes (Addendum)
ANTICOAGULATION CONSULT NOTE -  Pharmacy Consult for warfarin Indication: atrial fibrillation  No Known Allergies  Patient Measurements: Height: 5\' 4"  (162.6 cm) Weight: 220 lb 0.3 oz (99.8 kg) IBW/kg (Calculated) : 54.7   Vital Signs: Temp: 98.6 F (37 C) (02/22 0504) Temp Source: Oral (02/22 0504) BP: 119/75 (02/22 0504) Pulse Rate: 63 (02/22 0504)  Labs: Recent Labs    09/27/19 0525 09/28/19 0519 09/29/19 0520  HGB  --  10.1* 10.1*  HCT  --  30.5* 30.8*  PLT  --  186 182  LABPROT 26.1* 25.2* 24.1*  INR 2.4* 2.3* 2.2*  CREATININE  --  8.56* 10.95*    Estimated Creatinine Clearance: 4.7 mL/min (A) (by C-G formula based on SCr of 10.95 mg/dL (H)).   Medical History: Past Medical History:  Diagnosis Date  . Anemia   . Atrial fibrillation ([place])   . Chest pain 05/15/2006   Stress test negative for ischemia  . Diastolic heart failure ([place])   . Diverticulosis   . ESRD on hemodialysis ([place])   . GERD (gastroesophageal reflux disease)   . Headache   . Mixed hyperlipidemia   . Peripheral neuropathy   . PONV (postoperative nausea and vomiting)   . Systemic hypertension   . Uterine cancer ([facility])     Medications:  Medications Prior to Admission  Medication Sig Dispense Refill Last Dose  . acetaminophen (TYLENOL) 500 MG tablet Take 1,000 mg by mouth every 6 (six) hours as needed for moderate pain.    Past Week at Unknown time  . albuterol (VENTOLIN HFA) 108 (90 Base) MCG/ACT inhaler Inhale 2 puffs into the lungs every 4 (four) hours as needed for wheezing or shortness of breath (cough, shortness of breath or wheezing.). 18 g 1 09/22/2019 at Unknown time  . Cholecalciferol (VITAMIN D) 2000 UNITS CAPS Take 2,000 Units by mouth every evening.    09/21/2019 at Unknown time  . fluticasone (FLOVENT HFA) 44 MCG/ACT inhaler Inhale 2 puffs into the lungs 2 (two) times daily. 1 Inhaler 2 Past Month at Unknown time  . gabapentin (NEURONTIN) 100 MG capsule Take 100 mg by mouth 2  (two) times daily.    09/21/2019 at Unknown time  . metoprolol tartrate (LOPRESSOR) 25 MG tablet Take 1 tablet (25 mg total) by mouth 2 (two) times daily. 60 tablet 1 09/21/2019 at 1830  . Multiple Vitamins-Minerals (PRESERVISION AREDS 2 PO) Take 1 tablet by mouth 2 (two) times daily.   09/21/2019 at Unknown time  . multivitamin (RENA-VIT) TABS tablet Take 1 tablet by mouth every evening.    09/21/2019 at Unknown time  . Omega-3 Fatty Acids (OMEGA-3 FISH OIL) 300 MG CAPS Take 300 mg by mouth 2 (two) times daily.   09/21/2019 at Unknown time  . omeprazole (PRILOSEC) 20 MG capsule Take 20 mg by mouth every morning.    09/21/2019 at Unknown time  . Propylene Glycol (SYSTANE BALANCE) 0.6 % SOLN Place 1 drop into both eyes 3 (three) times daily as needed (for burning or dry eyes).    Past Month at Unknown time  . RENVELA 800 MG tablet Take 2,400 mg by mouth 3 (three) times daily with meals. Takes 1 tablet with snacks   09/21/2019 at Unknown time  . warfarin (COUMADIN) 5 MG tablet TAKE 1 & 1/2 TABLETS BY MOUTH (ONE & ONE-HALF) TO 2 TABLETS ONCE DAILY AS DIRECTED BY COUMADIN CLINIC (Patient taking differently: Take 7.5-10 mg by mouth daily at 6 PM. TAKE 2 TABLET BY MOUTH  ON Monday AND 1 1/2 TABLETS BY MOUTH (ONE & ONE-HALF) ON Tuesday, Wednesday, Thursday,  Friday, Saturday, Sunday.) 150 tablet 0 09/21/2019 at 1830  . predniSONE (STERAPRED UNI-PAK 21 TAB) 10 MG (21) TBPK tablet Take by mouth daily. Take 6 tabs by mouth daily  for 2 days, then 5 tabs for 2 days, then 4 tabs for 2 days, then 3 tabs for 2 days, 2 tabs for 2 days, then 1 tab by mouth daily for 2 days (Patient not taking: Reported on 09/22/2019) 42 tablet 0 Not Taking at Unknown time    Assessment: Pharmacy consulted to dose warfarin in patient with atrial fibrillation.   Coumadin 10mg  dose ordered 2/15 not given. Amiodarone discontinued. INR is now therapeutic at 2.1> 2.5>2.4>2.3>2.2   Home dose listed as 10 mg every Mon and 7.5 mg ROW per anticoag  clinic.  Goal of Therapy:  INR 2-3 Monitor platelets by anticoagulation protocol: Yes   Plan:  Warfarin 7.5 mg x 1 dose.  Monitor daily INR and s/s of bleeding.  Margot Ables, PharmD Clinical Pharmacist 09/29/2019 8:53 AM

## 2019-09-29 NOTE — Procedures (Signed)
     HEMODIALYSIS TREATMENT NOTE:  3.5 hour heparin-free treatment completed via left forearm AVF (15g/antegrade). Goal met: 1 liter removed with soft blood pressures.  All blood was returned and hemostasis was achieved in 15 minutes.  Rockwell [place], RN

## 2019-09-29 NOTE — Progress Notes (Addendum)
Patient Demographics:    Sabrina Beck, is a 79 y.o. female, DOB - Apr 27, 1941, ATF:[id number]  Admit date - 09/22/2019   Admitting Physician Sabrina Langenfeld Denton Brick, MD  Outpatient Primary MD for the patient is Sabrina School, MD  LOS - 7   Chief Complaint  Patient presents with  . Shortness of Breath        Subjective:    Sabrina Beck she is feeling better.   -Much less short of breath, no tachycardia, some dyspnea on exertion persist,   Assessment  & Plan :    Principal Problem:   PNA (pneumonia) Active Problems:   Atrial fibrillation (HCC)   Long term current use of anticoagulant therapy   ESRD (end stage renal disease) (HCC)   Anemia   Sepsis (HCC)   Atrial fibrillation with rapid ventricular response (HCC)   Chronic systolic heart failure [facility])  Brief Summary 79 y.o. female with past medical history relevant for chronic A. fib on Coumadin for anticoagulation and metoprolol for rate control, as well as history of ESRD with hemodialysis on Mondays Wednesdays and Fridays, COPD, dCHF and anemia of ESRD admitted on 09/22/2019 with new onset acute hypoxic respiratory failure in the setting of sepsis due to bilateral pneumonia and A. fib with RVR (not new)  A/p 1)Bilateral community-acquired Pneumonia with Acute hypoxic respiratory failure--- hypoxia is new this admission, - oxygen via nasal cannula down from 5 L -Overall sepsis pathophysiology appears to have resolved -Complete azithromycin on 09/27/2019 -Completed IV Rocephin thru 09/29/19 - c/n bronchodilators and mucolytics as ordered   2)Chronic Afib --- with RVR, last known EF 45%, patient was initially treated with IV Cardizem became hypotensive, Cardizem was discontinued, tachycardia reoccurred, IV Cardizem was tried again patient became hypotensive again -Tachycardia persisted on IV amiodarone and metoprolol---  -Cardiology consult  appreciated,  -Treated with IV amiodarone and IV esmolol drip given persistent challenges with rate control -TSH is 4.3,  -Coumadin for anticoagulation,   -Overall heart rate began to stabilize, continue p.o. metoprolol  esmolol drip was weaned off. -off  amiodarone. - 3)Sepsis secondary to Pneumonia in a hemodialysis patient--- we avoided giving full amount of required IV fluid boluses due to hemodialysis/ESRD status -Sepsis pathophysiology appears to be resolving, no fevers, no leukocytosis, oxygen requirement is improving -Repeat chest x-ray on 09/27/2019, shows stable findings of bilateral patchy airspace process, worse over left base -Hemodynamics have stabilized -No fever  4)ESRD--nephrology consult for HD appreciated patient usually gets HD Monday Wednesdays and Fridays, she has not missed any HD sessions --Last HD treatment 09/29/2019 -continue midodrine pressure support  5)Anemia of CKD--- hemoglobin is stable , EPO/ESA agent per nephrology team  6)Acute hypoxic respiratory failure secondary to #1 #2 above--- hypoxia improving with hemodialysis and treatment of pneumonia  as well as with better rate control as above #1  7)HFrEF--acute on chronic combined systolic and diastolic dysfunction CHF--- fluid balance remains negative, -Continue attempts at rate control as above in #2,  continue to use hemodialysis to address volume status, -Cardiology input appreciated -Echo shows EF of 40-45% which is similar to Echo from 08/2018  Disposition/Need for in-Hospital Stay- patient unable to be discharged at this time due to --hypoxic respiratory failure, requiring oxygen, rate control improving, -Not medically ready  for discharge home at this time -Possible discharge home with home health on 09/30/2019 if continues to improve  Code Status : full  Family Communication: discussed with patient and son at the bedside  Consults  : Cardiology/nephrology  DVT Prophylaxis  : Coumadin- SCDs    Lab Results  Component Value Date   PLT 182 09/29/2019   Inpatient Medications  Scheduled Meds: . budesonide  0.5 mg Inhalation BID  . Chlorhexidine Gluconate Cloth  6 each Topical Q0600  . Chlorhexidine Gluconate Cloth  6 each Topical Q0600  . cholecalciferol  2,000 Units Oral QPM  . gabapentin  100 mg Oral BID  . guaiFENesin  600 mg Oral BID  . levalbuterol  0.63 mg Nebulization TID  . metoprolol tartrate  25 mg Oral BID  . midodrine  5 mg Oral TID WC  . multivitamin  1 tablet Oral QPM  . multivitamin-lutein  1 capsule Oral BID  . omega-3 acid ethyl esters  1,000 mg Oral BID  . pantoprazole  40 mg Oral BID AC  . sevelamer carbonate  2,400 mg Oral TID WC  . sodium chloride flush  3 mL Intravenous Q12H  . Warfarin - Pharmacist Dosing Inpatient   Does not apply q1800   Continuous Infusions: . sodium chloride 250 mL (09/23/19 0820)  . sodium chloride    . sodium chloride    . albumin human    . cefTRIAXone (ROCEPHIN)  IV 1 g (09/29/19 0849)   PRN Meds:.sodium chloride, sodium chloride, sodium chloride, acetaminophen **OR** acetaminophen, guaiFENesin-dextromethorphan, lidocaine (PF), lidocaine-prilocaine, ondansetron (ZOFRAN) IV, ondansetron **OR** ondansetron (ZOFRAN) IV, pentafluoroprop-tetrafluoroeth, polyethylene glycol, polyvinyl alcohol, sodium chloride flush, traZODone    Anti-infectives (From admission, onward)   Start     Dose/Rate Route Frequency Ordered Stop   09/23/19 1100  vancomycin (VANCOCIN) IVPB 1000 mg/200 mL premix     1,000 mg 200 mL/hr over 60 Minutes Intravenous  Once 09/23/19 1049 09/23/19 1233   09/23/19 1000  azithromycin (ZITHROMAX) 500 mg in sodium chloride 0.9 % 250 mL IVPB     500 mg 250 mL/hr over 60 Minutes Intravenous Every 24 hours 09/22/19 1018 09/27/19 1214   09/23/19 1000  cefTRIAXone (ROCEPHIN) 1 g in sodium chloride 0.9 % 100 mL IVPB     1 g 200 mL/hr over 30 Minutes Intravenous Every 24 hours 09/22/19 1018     09/22/19 1800   vancomycin (VANCOCIN) IVPB 1000 mg/200 mL premix  Status:  Discontinued     1,000 mg 200 mL/hr over 60 Minutes Intravenous Every M-W-F (Hemodialysis) 09/22/19 1300 09/23/19 1049   09/22/19 1030  cefTRIAXone (ROCEPHIN) 1 g in sodium chloride 0.9 % 100 mL IVPB  Status:  Discontinued     1 g 200 mL/hr over 30 Minutes Intravenous Every 24 hours 09/22/19 1015 09/22/19 1018   09/22/19 1030  azithromycin (ZITHROMAX) 500 mg in sodium chloride 0.9 % 250 mL IVPB  Status:  Discontinued     500 mg 250 mL/hr over 60 Minutes Intravenous Every 24 hours 09/22/19 1015 09/22/19 1018   09/22/19 0715  vancomycin (VANCOREADY) IVPB 1500 mg/300 mL     1,500 mg 150 mL/hr over 120 Minutes Intravenous  Once 09/22/19 0704 09/22/19 1600   09/22/19 0700  cefTRIAXone (ROCEPHIN) 1 g in sodium chloride 0.9 % 100 mL IVPB     1 g 200 mL/hr over 30 Minutes Intravenous  Once 09/22/19 0645 09/22/19 0904   09/22/19 0700  azithromycin (ZITHROMAX) 500 mg in sodium chloride 0.9 %  250 mL IVPB     500 mg 250 mL/hr over 60 Minutes Intravenous  Once 09/22/19 0645 09/22/19 1017        Objective:   Vitals:   09/29/19 0504 09/29/19 0805 09/29/19 1422 09/29/19 1528  BP: 119/75   111/68  Pulse: 63   83  Resp: 20   18  Temp: 98.6 F (37 C)     TempSrc: Oral     SpO2: 99% 98% 94% 94%  Weight: 99.8 kg     Height:        Wt Readings from Last 3 Encounters:  09/29/19 99.8 kg  06/05/19 99.8 kg  05/06/19 98.9 kg     Intake/Output Summary (Last 24 hours) at 09/29/2019 1842 Last data filed at 09/29/2019 1700 Gross per 24 hour  Intake 240 ml  Output --  Net 240 ml   Physical Exam  General exam: Alert, awake, oriented x 3 Respiratory system: coarse breath sounds at bases.,  Nonexertional Cardiovascular system:irregular. No murmurs, rubs, gallops. Gastrointestinal system: Abdomen is nondistended, soft and nontender. . Normal bowel sounds heard. Central nervous system: Alert and oriented. No focal neurological  deficits. Extremities: No C/C/E, +pedal pulses Skin: No rashes, lesions or ulcers Psychiatry: Judgement and insight appear normal. Mood & affect appropriate.     Data Review:   Micro Results Recent Results (from the past 240 hour(s))  Respiratory Panel by RT PCR (Flu A&B, Covid) - Nasopharyngeal Swab     Status: None   Collection Time: 09/22/19  5:37 AM   Specimen: Nasopharyngeal Swab  Result Value Ref Range Status   SARS Coronavirus 2 by RT PCR NEGATIVE NEGATIVE Final    Comment: (NOTE) SARS-CoV-2 target nucleic acids are NOT DETECTED. The SARS-CoV-2 RNA is generally detectable in upper respiratoy specimens during the acute phase of infection. The lowest concentration of SARS-CoV-2 viral copies this assay can detect is 131 copies/mL. A negative result does not preclude SARS-Cov-2 infection and should not be used as the sole basis for treatment or other patient management decisions. A negative result may occur with  improper specimen collection/handling, submission of specimen other than nasopharyngeal swab, presence of viral mutation(s) within the areas targeted by this assay, and inadequate number of viral copies (<131 copies/mL). A negative result must be combined with clinical observations, patient history, and epidemiological information. The expected result is Negative. Fact Sheet for Patients:  PinkCheek.be Fact Sheet for Healthcare Providers:  GravelBags.it This test is not yet ap proved or cleared by the [place] FDA and  has been authorized for detection and/or diagnosis of SARS-CoV-2 by FDA under an Emergency Use Authorization (EUA). This EUA will remain  in effect (meaning this test can be used) for the duration of the COVID-19 declaration under Section 564(b)(1) of the Act, 21 U.S.C. section 360bbb-3(b)(1), unless the authorization is terminated or revoked sooner.    Influenza A by PCR NEGATIVE  NEGATIVE Final   Influenza B by PCR NEGATIVE NEGATIVE Final    Comment: (NOTE) The Xpert Xpress SARS-CoV-2/FLU/RSV assay is intended as an aid in  the diagnosis of influenza from Nasopharyngeal swab specimens and  should not be used as a sole basis for treatment. Nasal washings and  aspirates are unacceptable for Xpert Xpress SARS-CoV-2/FLU/RSV  testing. Fact Sheet for Patients: PinkCheek.be Fact Sheet for Healthcare Providers: GravelBags.it This test is not yet approved or cleared by the [place] FDA and  has been authorized for detection and/or diagnosis of SARS-CoV-2 by  FDA under an  Emergency Use Authorization (EUA). This EUA will remain  in effect (meaning this test can be used) for the duration of the  Covid-19 declaration under Section 564(b)(1) of the Act, 21  U.S.C. section 360bbb-3(b)(1), unless the authorization is  terminated or revoked. Performed at [facility], [address], [place], [place] [address]   Blood culture (routine x 2)     Status: None   Collection Time: 09/22/19  6:32 AM   Specimen: BLOOD  Result Value Ref Range Status   Specimen Description BLOOD LEFT HAND  Final   Special Requests   Final    BOTTLES DRAWN AEROBIC AND ANAEROBIC Blood Culture adequate volume   Culture   Final    NO GROWTH 5 DAYS Performed at [facility], [address] [place], [place] [address]    Report Status 09/27/2019 FINAL  Final  Blood culture (routine x 2)     Status: None   Collection Time: 09/22/19  6:39 AM   Specimen: BLOOD  Result Value Ref Range Status   Specimen Description BLOOD LEFT HAND  Final   Special Requests   Final    BOTTLES DRAWN AEROBIC AND ANAEROBIC Blood Culture adequate volume   Culture   Final    NO GROWTH 5 DAYS Performed at [facility], [address], [place], [place] [address]    Report Status 09/27/2019 FINAL  Final  MRSA PCR Screening     Status: None   Collection Time: 09/22/19   8:45 PM   Specimen: Nasal Mucosa; Nasopharyngeal  Result Value Ref Range Status   MRSA by PCR NEGATIVE NEGATIVE Final    Comment:        The GeneXpert MRSA Assay (FDA approved for NASAL specimens only), is one component of a comprehensive MRSA colonization surveillance program. It is not intended to diagnose MRSA infection nor to guide or monitor treatment for MRSA infections. Performed at [facility], [address], [place],  [address]     Radiology Reports DG Chest 2 View  Result Date: 09/27/2019 CLINICAL DATA:  Dyspnea. EXAM: CHEST - 2 VIEW COMPARISON:  09/25/2019 FINDINGS: Lungs are adequately inflated demonstrate persistent bilateral patchy airspace opacification worse over the left base. Possible left effusion. Mild stable cardiomegaly. Remainder of the exam is unchanged. IMPRESSION: Stable bilateral patchy airspace process worse over the left base with possible left effusion. Findings likely due to multifocal infection. Stable cardiomegaly. Electronically Signed   By: Marin Olp M.D.   On: 09/27/2019 09:28   DG Chest Port 1 View  Result Date: 09/25/2019 CLINICAL DATA:  79 year old with shortness of breath. Follow-up pneumonia. Current history of end-stage renal disease on hemodialysis. Negative COVID-19 test on 09/22/2019. EXAM: PORTABLE CHEST 1 VIEW COMPARISON:  09/22/2019 and earlier, including CT chest 04/20/2019. FINDINGS: Cardiac silhouette moderately to markedly enlarged, unchanged. Airspace opacities throughout both lungs, most confluent in the LEFT LOWER LOBE, worse than on the examination 3 days ago. IMPRESSION: Worsening bilateral airspace opacities, most confluent in the LEFT LOWER LOBE. Pneumonia is favored over pulmonary edema. Electronically Signed   By: Evangeline Dakin M.D.   On: 09/25/2019 10:18   DG Chest Port 1 View  Result Date: 09/22/2019 CLINICAL DATA:  Shortness of breath EXAM: PORTABLE CHEST 1 VIEW COMPARISON:  04/20/2019 FINDINGS: Patchy  bilateral airspace opacity most dense in the peripheral left lung. Cardiomegaly. No effusion or pneumothorax. IMPRESSION: 1. Patchy bilateral pneumonia. 2. Chronic cardiomegaly. Electronically Signed   By: Monte Fantasia M.D.   On: 09/22/2019 05:55  ECHOCARDIOGRAM COMPLETE  Result Date: 09/27/2019    ECHOCARDIOGRAM REPORT   Patient Name:   YANITZA SHVARTSMAN Date of Exam: 09/27/2019 Medical Rec #:  [id number]      Height:       64.0 in Accession #:    [id number]     Weight:       216.3 lb Date of Birth:  17-Dec-1940      BSA:          2.022 m Patient Age:    5 years       BP:           117/69 mmHg Patient Gender: F              HR:           91 bpm. Exam Location:  [facility] Procedure: 2D Echo, Cardiac Doppler and Color Doppler Indications:    Atrial Fibrillation 427.31 / I48.91  History:        Patient has prior history of Echocardiogram examinations, most                 recent 08/22/2018. CHF, Arrythmias:Atrial Fibrillation; Risk                 Factors:Hypertension and Dyslipidemia. Chronic systolic heart                 failure,ESRD (end stage renal disease),Long term current use of                 anticoagulant therapy.  Sonographer:    Alvino Chapel RCS Referring Phys: [place]  1. Left ventricular ejection fraction, by estimation, is 40 to 45%. The left ventricle has mildly decreased function. The left ventricle demonstrates global hypokinesis with regional variation, septal paradox/dyskinesis. The left ventricular internal cavity size was mildly to moderately dilated. There is mild left ventricular hypertrophy. Left ventricular diastolic parameters are indeterminate in the setting of atrial fibrillation.  2. Right ventricular systolic function is normal. The right ventricular size is normal. There is moderately elevated pulmonary artery systolic pressure. The estimated right ventricular systolic pressure is 06.2 mmHg.  3. Left atrial size was severely dilated.  4. Right atrial size  was severely dilated.  5. The mitral valve is degenerative, mildly thickened and with mild to moderate annular calcification. Moderate mitral valve regurgitation.  6. Tricuspid valve regurgitation is moderate.  7. The aortic valve is tricuspid. Aortic valve regurgitation is not visualized. Mild to moderate aortic valve sclerosis/calcification is present, versus very mild aortic stenosis.  8. The inferior vena cava is dilated in size with <50% respiratory variability, suggesting right atrial pressure of 15 mmHg. FINDINGS  Left Ventricle: Left ventricular ejection fraction, by estimation, is 40 to 45%. The left ventricle has mildly decreased function. The left ventricle demonstrates global hypokinesis. The left ventricular internal cavity size was mildly to moderately dilated. There is mild left ventricular hypertrophy. Abnormal (paradoxical) septal motion, consistent with left bundle branch block. Left ventricular diastolic parameters are indeterminate. Right Ventricle: The right ventricular size is normal. No increase in right ventricular wall thickness. Right ventricular systolic function is normal. There is moderately elevated pulmonary artery systolic pressure. The tricuspid regurgitant velocity is 2.92 m/s, and with an assumed right atrial pressure of 15 mmHg, the estimated right ventricular systolic pressure is 37.6 mmHg. Left Atrium: Left atrial size was severely dilated. Right Atrium: Right atrial size was severely dilated. Pericardium: There is no evidence of pericardial effusion.  Presence of pericardial fat pad. Mitral Valve: The mitral valve is degenerative in appearance. There is mild thickening of the mitral valve leaflet(s). Mild to moderate mitral annular calcification. Moderate mitral valve regurgitation. Tricuspid Valve: The tricuspid valve is grossly normal. Tricuspid valve regurgitation is moderate. Aortic Valve: The aortic valve is tricuspid. Aortic valve regurgitation is not visualized. Mild to  moderate aortic valve sclerosis/calcification is present, without any evidence of aortic stenosis. Pulmonic Valve: The pulmonic valve was grossly normal. Pulmonic valve regurgitation is trivial. Aorta: The aortic root is normal in size and structure. Venous: The inferior vena cava is dilated in size with less than 50% respiratory variability, suggesting right atrial pressure of 15 mmHg. IAS/Shunts: No atrial level shunt detected by color flow Doppler.  LEFT VENTRICLE PLAX 2D LVIDd:         6.22 cm LVIDs:         3.83 cm LV PW:         1.11 cm LV IVS:        1.19 cm LVOT diam:     1.90 cm LV SV:         56.99 ml LV SV Index:   28.18 LVOT Area:     2.84 cm  LV Volumes (MOD) LV vol d, MOD A2C: 176.0 ml LV vol d, MOD A4C: 140.0 ml LV vol s, MOD A2C: 77.2 ml LV vol s, MOD A4C: 80.6 ml LV SV MOD A2C:     98.8 ml LV SV MOD A4C:     140.0 ml LV SV MOD BP:      80.9 ml RIGHT VENTRICLE TAPSE (M-mode): 1.2 cm LEFT ATRIUM              Index       RIGHT ATRIUM           Index LA diam:        4.20 cm  2.08 cm/m  RA Area:     29.30 cm LA Vol (A2C):   100.0 ml 49.44 ml/m RA Volume:   111.00 ml 54.88 ml/m LA Vol (A4C):   129.0 ml 63.78 ml/m LA Biplane Vol: 126.0 ml 62.30 ml/m  AORTIC VALVE LVOT Vmax:   104.00 cm/s LVOT Vmean:  71.800 cm/s LVOT VTI:    0.201 m  AORTA Ao Root diam: 3.20 cm MITRAL VALVE                 TRICUSPID VALVE MV Area (PHT): 5.27 cm      TR Peak grad:   34.1 mmHg MV Decel Time: 144 msec      TR Vmax:        292.00 cm/s MR Peak grad:    69.9 mmHg MR Mean grad:    48.0 mmHg   SHUNTS MR Vmax:         418.00 cm/s Systemic VTI:  0.20 m MR Vmean:        327.0 cm/s  Systemic Diam: 1.90 cm MR PISA:         1.01 cm MR PISA Eff ROA: 6 mm MR PISA Radius:  0.40 cm MV E velocity: 148.00 cm/s Rozann Lesches MD Electronically signed by Rozann Lesches MD Signature Date/Time: 09/27/2019/2:42:43 PM    Final      CBC Recent Labs  Lab 09/23/19 0535 09/26/19 0405 09/28/19 0519 09/29/19 0520  WBC 9.9 7.9 8.1 8.6   HGB 10.7* 10.4* 10.1* 10.1*  HCT 32.9* 31.9* 30.5* 30.8*  PLT 155 171 186 182  MCV 113.4* 114.3* 113.0* 113.2*  MCH 36.9* 37.3* 37.4* 37.1*  MCHC 32.5 32.6 33.1 32.8  RDW 14.8 14.5 14.4 14.6    Chemistries  Recent Labs  Lab 09/22/19 1859 09/23/19 0535 09/23/19 0535 09/24/19 0402 09/25/19 0356 09/26/19 0405 09/28/19 0519 09/29/19 0520  NA  --  136   < > 137 136 136 135 136  K  --  4.5   < > 5.1 4.4 4.5 3.9 4.4  CL  --  91*   < > 91* 88* 89* 90* 89*  CO2  --  25   < > 24 26 25 25 26   GLUCOSE  --  134*   < > 138* 122* 112* 99 110*  BUN  --  40*   < > 63* 42* 66* 58* 77*  CREATININE  --  8.79*   < > 11.04* 7.42* 9.62* 8.56* 10.95*  CALCIUM  --  9.6   < > 9.8 10.0 9.6 9.8 9.5  MG 1.8 1.9  --   --   --   --   --   --   AST  --  21  --   --   --   --   --   --   ALT  --  21  --   --   --   --   --   --   ALKPHOS  --  63  --   --   --   --   --   --   BILITOT  --  1.0  --   --   --   --   --   --    < > = values in this interval not displayed.   ------------------------------------------------------------------------------------------------------------------ No results for input(s): CHOL, HDL, LDLCALC, TRIG, CHOLHDL, LDLDIRECT in the last 72 hours.  No results found for: HGBA1C ------------------------------------------------------------------------------------------------------------------ No results for input(s): TSH, T4TOTAL, T3FREE, THYROIDAB in the last 72 hours.  Invalid input(s): FREET3 ------------------------------------------------------------------------------------------------------------------ No results for input(s): VITAMINB12, FOLATE, FERRITIN, TIBC, IRON, RETICCTPCT in the last 72 hours.  Coagulation profile Recent Labs  Lab 09/25/19 0356 09/26/19 0405 09/27/19 0525 09/28/19 0519 09/29/19 0520  INR 2.1* 2.5* 2.4* 2.3* 2.2*    No results for input(s): DDIMER in the last 72 hours.  Cardiac Enzymes No results for input(s): CKMB, TROPONINI, MYOGLOBIN  in the last 168 hours.  Invalid input(s): CK ------------------------------------------------------------------------------------------------------------------    Component Value Date/Time   BNP 344.0 (H) 04/08/2019 1600    Roxan Hockey M.D on 09/29/2019 at 6:42 PM  Go to [email or web address] - for contact info  [facility] - Office  [phone]

## 2019-09-29 NOTE — Progress Notes (Signed)
[facility] Progress Note  Name: Sabrina Beck MRN: [id number] DOB: [birth year]  Chief Complaint:  Shortness of breath   Subjective:   Transition to metoprolol, off of IV amiodarone and esmolol Remains on 3 L nasal cannula States dyspnea has improve  ------ Background on consult:  Sabrina Beck is a 79 y.o. female with a history of end-stage renal disease, chronic diastolic CHF, anemia, and atrial fibrillation who presented to the hospital with shortness of breath.  She was found to have patchy bilateral pneumonia on chest x-ray.  Course here has also been complicated by A fib with RVR and she was ordered a diltiazem infusion.  She has been wheezing in the ER and has required supplemental oxygen at 3.5 - 4 L most recently.  She states that she noticed her shortness of breath when she woke up today.  I have discussed with the dialysis nurse to let her know the patient is here and to anticipate orders for dialysis soon.  She normally dialyzes Monday Wednesday Friday at [facility] [address]  States that she did go to dialysis on Friday and stayed her full treatment and normally does not have issues with tolerating dialysis.   She denies any nausea or vomiting or dizziness.  Denies any subjective fever or cough.  Just had a breathing treatment - still short of breath.  Thinks that she may carry fluid around her abdomen and lungs.     Intake/Output Summary (Last 24 hours) at 09/29/2019 0954 Last data filed at 09/28/2019 1800 Gross per 24 hour  Intake 694.93 ml  Output 1 ml  Net 693.93 ml    Vitals:  Vitals:   09/28/19 1917 09/28/19 2034 09/29/19 0504 09/29/19 0805  BP:  125/61 119/75   Pulse:  83 63   Resp:  20 20   Temp:  97.7 F (36.5 C) 98.6 F (37 C)   TempSrc:  Oral Oral   SpO2: 98% 98% 99% 98%  Weight:   99.8 kg   Height:         Physical Exam:    NAD, lying in bed Mild increased work of breathing with conversation, diminished in bases Tachycardic,  irregular, no rub Left arm AV fistula with bruit and thrill No significant lower extremity edema Soft, nontender, obese Nonfocal, CN II through XII grossly intact  Medications reviewed    Labs:  BMP Latest Ref Rng & Units 09/29/2019 09/28/2019 09/26/2019  Glucose 70 - 99 mg/dL 110(H) 99 112(H)  BUN 8 - 23 mg/dL 77(H) 58(H) 66(H)  Creatinine 0.44 - 1.00 mg/dL 10.95(H) 8.56(H) 9.62(H)  BUN/Creat Ratio 11 - 26 - - -  Sodium 135 - 145 mmol/L 136 135 136  Potassium 3.5 - 5.1 mmol/L 4.4 3.9 4.5  Chloride 98 - 111 mmol/L 89(L) 90(L) 89(L)  CO2 22 - 32 mmol/L 26 25 25   Calcium 8.9 - 10.3 mg/dL 9.5 9.8 9.6     Assessment/Plan:   # End-stage renal disease on hemodialysis - MWF [facility]  using LUE AVF - Cont HD on schedule, UF as able, next treatment today, 1 L UF, AV fistula, no heparin, 2K bath  # Acute hypoxic respiratory failure -Treatment for pneumonia per primary team  - CTX and Azithro, finishing up today  # Bilateral pneumonia - healthcare associated - Therapies per primary team -  On azithromycin ceftriaxone,as above Blood cultures no growth to date, COVID-19 and influenza negative  # Chronic combined systolic and diastolic CHF - optimize volume  status with HD, cardiology following   # A. fib with RVR - Cardiology following, on warfarin  # Anemia CKD - No acute indication for ESA   # Secondary hyperpara/bone mineral metabolism -Continue binders, outpatient management   Rexene Agent, MD 09/29/2019  9:54 AM

## 2019-09-30 ENCOUNTER — Inpatient Hospital Stay (HOSPITAL_COMMUNITY): Payer: Medicare HMO

## 2019-09-30 LAB — PROTIME-INR
INR: 2.1 — ABNORMAL HIGH (ref 0.8–1.2)
Prothrombin Time: 23.8 seconds — ABNORMAL HIGH (ref 11.4–15.2)

## 2019-09-30 MED ORDER — WARFARIN SODIUM 5 MG PO TABS: ORAL_TABLET | ORAL | 0 refills | Status: DC

## 2019-09-30 MED ORDER — PANTOPRAZOLE SODIUM 40 MG PO TBEC: 40.0000 mg | DELAYED_RELEASE_TABLET | Freq: Every day | ORAL | 2 refills | Status: DC

## 2019-09-30 MED ORDER — MIDODRINE HCL 5 MG PO TABS: 5.0000 mg | ORAL_TABLET | Freq: Three times a day (TID) | ORAL | 2 refills | Status: AC

## 2019-09-30 MED ORDER — GUAIFENESIN ER 600 MG PO TB12: 600.0000 mg | ORAL_TABLET | Freq: Two times a day (BID) | ORAL | 0 refills | Status: AC

## 2019-09-30 MED ORDER — WARFARIN SODIUM 5 MG PO TABS: 10.0000 mg | ORAL_TABLET | Freq: Once | ORAL | Status: DC

## 2019-09-30 MED ORDER — ONDANSETRON HCL 4 MG PO TABS: 4.0000 mg | ORAL_TABLET | Freq: Four times a day (QID) | ORAL | 0 refills | Status: DC | PRN

## 2019-09-30 MED ORDER — METOPROLOL TARTRATE 25 MG PO TABS: 25.0000 mg | ORAL_TABLET | Freq: Two times a day (BID) | ORAL | 2 refills | Status: AC

## 2019-09-30 NOTE — TOC Transition Note (Signed)
Transition of Care [facility]) - CM/SW Discharge Note   Patient Details  Name: Sabrina Beck MRN: [id number] Date of Birth: [birth year]  [facility] CM/SW Contact:  Boneta Lucks, RN Phone Number: 09/30/2019, 2:38 PM   Clinical Narrative:   Patient discharging home. Updated Vaughan Basta with [place].  Patient is requiring home oxygen. Orders placed, Call [facility] with [place], she is onsite and will deliver oxygen to the room. RN updated.     Final next level of care: [place] Barriers to Discharge: Barriers Resolved   Patient Goals and CMS Choice Patient states their goals for this hospitalization and ongoing recovery are:: family states that the goal is for her to get well and return home CMS Medicare.gov Compare Post Acute Care list provided to:: Patient Choice offered to / list presented to : Patient  Discharge Placement               Patient to be transferred to facility by: family   Patient and family notified of of transfer: 09/30/19  Discharge Plan and Services In-house Referral: Clinical Social Work              DME Arranged: Oxygen DME Agency: [employer] Date DME Agency Contacted: 09/30/19 Time DME Agency Contacted: 63 Representative spoke with at DME Agency: [place] Agency: [place] ([place])     Readmission Risk Interventions Readmission Risk Prevention Plan 04/25/2019 04/15/2019 04/11/2019  Transportation Screening Complete Complete -  PCP or Specialist Appt within 3-5 Days - Complete Complete  HRI or Home Care Consult - Not Complete -  Social Work Consult for [place] Planning/Counseling - Complete -  Palliative Care Screening - Not Complete -  Medication Review Press photographer) Complete Complete -  PCP or Specialist appointment within 3-5 days of discharge Complete - -  [place] or Home Care Consult Complete - -  SW Recovery Care/Counseling Consult Complete - -  Palliative Care Screening  Not Applicable - -  [place] Not Applicable - -  Some recent data might be hidden

## 2019-09-30 NOTE — Progress Notes (Signed)
[facility] Progress Note  Name: Sabrina Beck MRN: [id number] DOB: 01-25-1941  Chief Complaint:  Shortness of breath   Subjective:   Working with PT, using walker Weaned to room air/1 L nasal cannula HD overnight, 1 L UF  ------ Background on consult:  Sabrina Beck is a 79 y.o. female with a history of end-stage renal disease, chronic diastolic CHF, anemia, and atrial fibrillation who presented to the hospital with shortness of breath.  She was found to have patchy bilateral pneumonia on chest x-ray.  Course here has also been complicated by A fib with RVR and she was ordered a diltiazem infusion.  She has been wheezing in the ER and has required supplemental oxygen at 3.5 - 4 L most recently.  She states that she noticed her shortness of breath when she woke up today.  I have discussed with the dialysis nurse to let her know the patient is here and to anticipate orders for dialysis soon.  She normally dialyzes Monday Wednesday Friday at [facility] [address]  States that she did go to dialysis on Friday and stayed her full treatment and normally does not have issues with tolerating dialysis.   She denies any nausea or vomiting or dizziness.  Denies any subjective fever or cough.  Just had a breathing treatment - still short of breath.  Thinks that she may carry fluid around her abdomen and lungs.     Intake/Output Summary (Last 24 hours) at 09/30/2019 0923 Last data filed at 09/30/2019 0005 Gross per 24 hour  Intake 240 ml  Output 1000 ml  Net -760 ml    Vitals:  Vitals:   09/30/19 0015 09/30/19 0502 09/30/19 0727 09/30/19 0731  BP: 122/61 116/61    Pulse: 88 94    Resp: 18 18    Temp: 97.9 F (36.6 C) (!) 97.5 F (36.4 C)    TempSrc: Oral Oral    SpO2: 94% 90% 94% 95%  Weight:      Height:         Physical Exam:    NAD Improved aeration throughout Normal rate, irregular, no rub Left arm AV fistula with bruit and thrill No significant lower  extremity edema Soft, nontender, obese Nonfocal, CN II through XII grossly intact  Medications reviewed    Labs:  BMP Latest Ref Rng & Units 09/29/2019 09/28/2019 09/26/2019  Glucose 70 - 99 mg/dL 110(H) 99 112(H)  BUN 8 - 23 mg/dL 77(H) 58(H) 66(H)  Creatinine 0.44 - 1.00 mg/dL 10.95(H) 8.56(H) 9.62(H)  BUN/Creat Ratio 11 - 26 - - -  Sodium 135 - 145 mmol/L 136 135 136  Potassium 3.5 - 5.1 mmol/L 4.4 3.9 4.5  Chloride 98 - 111 mmol/L 89(L) 90(L) 89(L)  CO2 22 - 32 mmol/L 26 25 25   Calcium 8.9 - 10.3 mg/dL 9.5 9.8 9.6     Assessment/Plan:   # End-stage renal disease on hemodialysis - [facility] using LUE AVF - Cont HD on schedule, UF as able, next treatment tomorrow, 1-2 L UF, AV fistula, no heparin, 2K bath  # Acute hypoxic respiratory failure -Slowly improving, treated for pneumonia   # Bilateral pneumonia - healthcare associated - Therapies per primary team -completed course of azithromycin ceftriaxone,as above Blood cultures no growth to date, COVID-19 and influenza negative  # Chronic combined systolic and diastolic CHF - optimize volume status with HD, cardiology following   # A. fib with RVR, improved on metoprolol - Cardiology following, on  warfarin  # Anemia CKD - No acute indication for ESA   # Secondary hyperpara/bone mineral metabolism -Continue binders, outpatient management   Rexene Agent, MD 09/30/2019  9:23 AM

## 2019-09-30 NOTE — Discharge Summary (Signed)
Sabrina Beck, is a 79 y.o. female  DOB [birth year]  MRN [id number].  Admission date:  09/22/2019  Admitting Physician  Roxan Hockey, MD  Discharge Date:  09/30/2019   Primary MD  Redmond School, MD  Recommendations for primary care physician for things to follow:   1)Very low-salt diet advised 2)Weigh yourself daily, call if you gain more than 3 pounds in 1 day or more than 5 pounds in 1 week as your medications may need to be adjusted 3)Limit your Fluid  intake to no more than 60 ounces (1.8 Liters) per day  Admission Diagnosis  ESRD (end stage renal disease) ([place]) [N18.6] Atrial fibrillation with rapid ventricular response (HCC) [I48.91] PNA (pneumonia) [J18.9] Community acquired pneumonia, unspecified laterality [J18.9]   Discharge Diagnosis  ESRD (end stage renal disease) ([place]) [N18.6] Atrial fibrillation with rapid ventricular response (HCC) [I48.91] PNA (pneumonia) [J18.9] Community acquired pneumonia, unspecified laterality [J18.9]  Principal Problem:   PNA (pneumonia) Active Problems:   Atrial fibrillation ([place])   Long term current use of anticoagulant therapy   ESRD (end stage renal disease) ([place])   Anemia   Sepsis ([place])   Atrial fibrillation with rapid ventricular response ([place])   Chronic systolic heart failure ([place])      Past Medical History:  Diagnosis Date  . Anemia   . Atrial fibrillation ([place])   . Chest pain 05/15/2006   Stress test negative for ischemia  . Diastolic heart failure ([place])   . Diverticulosis   . ESRD on hemodialysis ([place])   . GERD (gastroesophageal reflux disease)   . Headache   . Mixed hyperlipidemia   . Peripheral neuropathy   . PONV (postoperative nausea and vomiting)   . Systemic hypertension   . Uterine cancer [facility])     Past Surgical History:  Procedure Laterality Date  . ABDOMINAL HYSTERECTOMY    . APPENDECTOMY    . AV FISTULA PLACEMENT  Left 11/17/2013   Procedure: CREATION OF LEFT RADIOCEPHALIC ARTERIOVENOUS (AV) FISTULA ;  Surgeon: [name], MD;  Location: [place];  Service: Vascular;  Laterality: Left;  . BREAST SURGERY Left    tumors non canerous 2- 3 different surgeries  . CATARACT EXTRACTION W/PHACO Right 10/26/2016   Procedure: CATARACT EXTRACTION PHACO AND INTRAOCULAR LENS PLACEMENT (IOC) CDE= 11.91;  Surgeon: Tonny Branch, MD;  Location: [facility];  Service: Ophthalmology;  Laterality: Right;  right - pt knows to arrive at 6:30  . CATARACT EXTRACTION W/PHACO Left 11/30/2016   Procedure: CATARACT EXTRACTION PHACO AND INTRAOCULAR LENS PLACEMENT (IOC) CDE - 7.92 ;  Surgeon: Tonny Branch, MD;  Location: [facility];  Service: Ophthalmology;  Laterality: Left;  left  . CHOLECYSTECTOMY    . COLONOSCOPY W/ BIOPSIES AND POLYPECTOMY    . FISTULA SUPERFICIALIZATION Left 05/19/2014   Procedure: FISTULA SUPERFICIALIZATION-LEFT ARM;  Surgeon: [name], MD;  Location: [place];  Service: Vascular;  Laterality: Left;  . INSERTION OF DIALYSIS CATHETER N/A 01/24/2014   Procedure: INSERTION OF DIALYSIS CATHETER;  Surgeon: Rosetta Posner,  MD;  Location[facility];  Service: Vascular;  Laterality: N/A;  . KNEE ARTHROSCOPY    . TUBAL LIGATION       HPI  from the history and physical done on the day of admission:    Sabrina Beck  is a 79 y.o. female with past medical history relevant for chronic A. fib on Coumadin for anticoagulation and metoprolol for rate control, as well as history of ESRD with hemodialysis on Mondays Wednesdays and Fridays, COPD, dCHF and anemia of ESRD who presents with shortness of breath and found to be hypoxic in the ED requiring up to 4 L of oxygen via nasal cannula in the setting of bilateral pneumonia -Patient met sepsis criteria with tachycardia, tachypnea as well as hypoxia, on leukocytosis, elevated lactic acid, -Chest x-ray consistent with bilateral pneumonia -Patient is found to be in A. fib with RVR with heart rate  in the 140s in the ED initially treated with IV Cardizem but had hypotension -INR is 2.1, WBC is 11    Hospital Course:   Brief Summary 79 y.o.femalewith past medical history relevant for chronic A. fib on Coumadin for anticoagulation and metoprolol for rate control,as well as history of ESRD with hemodialysis on Mondays Wednesdays and Fridays, COPD, dCHFand anemia of ESRD admitted on 09/22/2019 with new onset acute hypoxic respiratory failure in the setting of sepsis due to bilateral pneumonia and A. fib with RVR (not new)  A/p 1)Bilateral community-acquired Pneumonia with Acute hypoxic respiratory failure--- hypoxia is new this admission, - oxygen via nasal cannula down from 5 L -Overall sepsis pathophysiology has resolved -Completed azithromycin on 09/27/2019 -Completed IV Rocephin thru 09/29/19 - c/n bronchodilators and mucolytics as ordered   2)Chronic Afib ---with RVR,last known EF 45%, patient was initially treated with IV Cardizem became hypotensive, Cardizem was discontinued, tachycardia reoccurred,IV Cardizem was tried again patient became hypotensive again -Tachycardia persisted on IV amiodarone and metoprolol---  -Cardiology consult appreciated,  -Treated with IV amiodarone and IV esmolol drip given persistent challenges with rate control -TSH is 4.3, -Coumadin for anticoagulation,  -Overall heart rate began to stabilize, continue p.o. metoprolol  esmolol drip was weaned off. -off  amiodarone. -Okay to discharge on oral metoprolol and midodrine - 3)Sepsis secondary to Pneumonia in a hemodialysis patient--- we avoided giving full amount of required IV fluid boluses due to hemodialysis/ESRD status -Sepsis pathophysiology has resolved, no fevers, no leukocytosis, oxygen requirement is improving, still requires about 2 L of oxygen via nasal cannula -Repeat chest x-ray on 09/27/2019, shows stable findings of bilateral patchy airspace process, worse over left  base -Hemodynamics have stabilized -No fever  4)ESRD--nephrology consult for HD appreciated patient usually gets HD Monday Wednesdays and Fridays, she has not missed any HD sessions --Last HD treatment 09/29/2019 -continue midodrine pressure support  5)Anemia of CKD---hemoglobin is stable , EPO/ESA agent per nephrology team  6)Acute hypoxic respiratory failure secondary to #1 #2 above--- hypoxia improving with hemodialysis and treatment of pneumonia  as well as with better rate control as above #1 -Patient will need oxygen at 2 L/min via nasal cannula upon discharge  7)HFrEF--acute on chronic combined systolic and diastolic dysfunction CHF--- fluid balance remains negative,  continue to use hemodialysis to address volume status, -Cardiology input appreciated -Echo shows EF of 40-45% which is similar to Echo from 08/2018  Disposition--- discharge home with home oxygen  Code Status : full  Family Communication: discussed with patient and son at the bedside  Consults  : Cardiology/nephrology  Discharge Condition: Stable  Follow UP  Follow-up Information    [facility].   Specialty: Home Health Services Why:  RN       [place] Follow up.   Why:   Home oxygen         Diet and Activity recommendation:  As advised  Discharge Instructions    Discharge Instructions    Call MD for:  difficulty breathing, headache or visual disturbances   Complete by: As directed    Call MD for:  extreme fatigue   Complete by: As directed    Call MD for:  persistant dizziness or light-headedness   Complete by: As directed    Call MD for:  persistant nausea and vomiting   Complete by: As directed    Call MD for:  severe uncontrolled pain   Complete by: As directed    Call MD for:  temperature >100.4   Complete by: As directed    Diet - low sodium heart healthy   Complete by: As directed    Discharge instructions   Complete by: As  directed    1)Very low-salt diet advised 2)Weigh yourself daily, call if you gain more than 3 pounds in 1 day or more than 5 pounds in 1 week as your  medications may need to be adjusted 3)Limit your Fluid  intake to no more than 60 ounces (1.8 Liters) per day   Increase activity slowly   Complete by: As directed        Discharge Medications     Allergies as of 09/30/2019   No Known Allergies     Medication List    STOP taking these medications   omeprazole 20 MG capsule Commonly known as: PRILOSEC Replaced by: pantoprazole 40 MG tablet   predniSONE 10 MG (21) Tbpk tablet Commonly known as: STERAPRED UNI-PAK 21 TAB     TAKE these medications   acetaminophen 500 MG tablet Commonly known as: TYLENOL Take 1,000 mg by mouth every 6 (six) hours as needed for moderate pain.   albuterol 108 (90 Base) MCG/ACT inhaler Commonly known as: VENTOLIN HFA Inhale 2 puffs into the lungs every 4 (four) hours as needed for wheezing or shortness of breath (cough, shortness of breath or wheezing.).   fluticasone 44 MCG/ACT inhaler Commonly known as: FLOVENT HFA Inhale 2 puffs into the lungs 2 (two) times daily.   gabapentin 100 MG capsule Commonly known as: NEURONTIN Take 100 mg by mouth 2 (two) times daily.   guaiFENesin 600 MG 12 hr tablet Commonly known as: MUCINEX Take 1 tablet (600 mg total) by mouth 2 (two) times daily.   metoprolol tartrate 25 MG tablet Commonly known as: LOPRESSOR Take 1 tablet (25 mg total) by mouth 2 (two) times daily.   midodrine 5 MG tablet Commonly known as: PROAMATINE Take 1 tablet (5 mg total) by mouth 3 (three) times daily with meals.   multivitamin Tabs tablet Take 1 tablet by mouth every evening.   Omega-3 Fish Oil 300 MG Caps Take 300 mg by mouth 2 (two) times daily.   ondansetron 4 MG tablet Commonly known as: ZOFRAN Take 1 tablet (4 mg total) by mouth every 6 (six) hours as needed for nausea.   pantoprazole 40 MG tablet Commonly  known as: PROTONIX Take 1 tablet (40 mg total) by mouth daily before breakfast. Replaces: omeprazole 20 MG capsule   PRESERVISION AREDS 2 PO Take 1 tablet by mouth 2 (two) times daily.   Renvela 800 MG  tablet Generic drug: sevelamer carbonate Take 2,400 mg by mouth 3 (three) times daily with meals. Takes 1 tablet with snacks   Systane Balance 0.6 % Soln Generic drug: Propylene Glycol Place 1 drop into both eyes 3 (three) times daily as needed (for burning or dry eyes).   Vitamin D 50 MCG (2000 UT) Caps Take 2,000 Units by mouth every evening.   warfarin 5 MG tablet Commonly known as: COUMADIN Take as directed. If you are unsure how to take this medication, talk to your nurse or doctor. Original instructions: TAKE 1 & 1/2 TABLETS BY MOUTH (ONE & ONE-HALF) TO 2 TABLETS ONCE DAILY AS DIRECTED BY COUMADIN CLINIC What changed:   how much to take  how to take this  when to take this  additional instructions            Durable Medical Equipment  (From admission, onward)         Start     Ordered   09/30/19 1126  For home use only DME oxygen  Once    Comments: SATURATION QUALIFICATIONS: (Thisnote is usedto comply with regulatory documentation for home oxygen)  Patient Saturations on Room Air at Rest =90 %  Patient Saturations on Room Air while Ambulating =82%  Patient Saturations on2Liters of oxygen while Ambulating = 93%     Patient needs continuous O2 at 2 L/min continuously via nasal cannula with humidifier, with gaseous portability and conserving device  Question Answer Comment  Length of Need Lifetime   Mode or (Route) Nasal cannula   Liters per Minute 2   Frequency Continuous (stationary and portable oxygen unit needed)   Oxygen conserving device Yes   Oxygen delivery system Gas      09/30/19 1125          Major procedures and Radiology Reports - PLEASE review detailed and final reports for all details, in brief -    DG Chest 2  View  Result Date: 09/30/2019 CLINICAL DATA:  Shortness of breath, history diastolic heart failure, end-stage renal disease on dialysis, hypertension, uterine cancer EXAM: CHEST - 2 VIEW COMPARISON:  09/27/2019 FINDINGS: Enlargement of cardiac silhouette with pulmonary vascular congestion. Mediastinal contours normal. Scattered pulmonary infiltrates bilaterally which could represent multifocal pneumonia, pulmonary edema not completely excluded. Small LEFT and tiny RIGHT pleural effusions. LEFT basilar atelectasis. No pneumothorax or acute osseous findings. IMPRESSION: Enlargement of cardiac silhouette with pulmonary vascular congestion. BILATERAL patchy pulmonary infiltrates which could represent multifocal pneumonia or less likely pulmonary edema little changed. Atelectasis versus consolidation LEFT lower lobe with small LEFT and tiny RIGHT pleural effusions. Electronically Signed   By: Lavonia Dana M.D.   On: 09/30/2019 11:42   DG Chest 2 View  Result Date: 09/27/2019 CLINICAL DATA:  Dyspnea. EXAM: CHEST - 2 VIEW COMPARISON:  09/25/2019 FINDINGS: Lungs are adequately inflated demonstrate persistent bilateral patchy airspace opacification worse over the left base. Possible left effusion. Mild stable cardiomegaly. Remainder of the exam is unchanged. IMPRESSION: Stable bilateral patchy airspace process worse over the left base with possible left effusion. Findings likely due to multifocal infection. Stable cardiomegaly. Electronically Signed   By: Marin Olp M.D.   On: 09/27/2019 09:28   DG Chest Port 1 View  Result Date: 09/25/2019 CLINICAL DATA:  79 year old with shortness of breath. Follow-up pneumonia. Current history of end-stage renal disease on hemodialysis. Negative COVID-19 test on 09/22/2019. EXAM: PORTABLE CHEST 1 VIEW COMPARISON:  09/22/2019 and earlier, including CT chest 04/20/2019. FINDINGS: Cardiac silhouette moderately to  markedly enlarged, unchanged. Airspace opacities throughout both  lungs, most confluent in the LEFT LOWER LOBE, worse than on the examination 3 days ago. IMPRESSION: Worsening bilateral airspace opacities, most confluent in the LEFT LOWER LOBE. Pneumonia is favored over pulmonary edema. Electronically Signed   By: Evangeline Dakin M.D.   On: 09/25/2019 10:18   DG Chest Port 1 View  Result Date: 09/22/2019 CLINICAL DATA:  Shortness of breath EXAM: PORTABLE CHEST 1 VIEW COMPARISON:  04/20/2019 FINDINGS: Patchy bilateral airspace opacity most dense in the peripheral left lung. Cardiomegaly. No effusion or pneumothorax. IMPRESSION: 1. Patchy bilateral pneumonia. 2. Chronic cardiomegaly. Electronically Signed   By: Monte Fantasia M.D.   On: 09/22/2019 05:55   ECHOCARDIOGRAM COMPLETE  Result Date: 09/27/2019    ECHOCARDIOGRAM REPORT   Patient Name:   ALISHEA BEAUDIN Date of Exam: 09/27/2019 Medical Rec #:  [id number]      Height:       64.0 in Accession #:    [id number]     Weight:       216.3 lb Date of Birth:  [birth year]      BSA:          2.022 m Patient Age:    47 years       BP:           117/69 mmHg Patient Gender: F              HR:           91 bpm. Exam Location:  [facility] Procedure: 2D Echo, Cardiac Doppler and Color Doppler Indications:    Atrial Fibrillation 427.31 / I48.91  History:        Patient has prior history of Echocardiogram examinations, most                 recent 08/22/2018. CHF, Arrythmias:Atrial Fibrillation; Risk                 Factors:Hypertension and Dyslipidemia. Chronic systolic heart                 failure,ESRD (end stage renal disease),Long term current use of                 anticoagulant therapy.  Sonographer:    Alvino Chapel RCS Referring Phys: [place]  1. Left ventricular ejection fraction, by estimation, is 40 to 45%. The left ventricle has mildly decreased function. The left ventricle demonstrates global hypokinesis with regional variation, septal paradox/dyskinesis. The left ventricular internal cavity size was  mildly to moderately dilated. There is mild left ventricular hypertrophy. Left ventricular diastolic parameters are indeterminate in the setting of atrial fibrillation.  2. Right ventricular systolic function is normal. The right ventricular size is normal. There is moderately elevated pulmonary artery systolic pressure. The estimated right ventricular systolic pressure is 99.7 mmHg.  3. Left atrial size was severely dilated.  4. Right atrial size was severely dilated.  5. The mitral valve is degenerative, mildly thickened and with mild to moderate annular calcification. Moderate mitral valve regurgitation.  6. Tricuspid valve regurgitation is moderate.  7. The aortic valve is tricuspid. Aortic valve regurgitation is not visualized. Mild to moderate aortic valve sclerosis/calcification is present, versus very mild aortic stenosis.  8. The inferior vena cava is dilated in size with <50% respiratory variability, suggesting right atrial pressure of 15 mmHg. FINDINGS  Left Ventricle: Left ventricular ejection fraction, by estimation, is 40 to 45%. The left ventricle  has mildly decreased function. The left ventricle demonstrates global hypokinesis. The left ventricular internal cavity size was mildly to moderately dilated. There is mild left ventricular hypertrophy. Abnormal (paradoxical) septal motion, consistent with left bundle branch block. Left ventricular diastolic parameters are indeterminate. Right Ventricle: The right ventricular size is normal. No increase in right ventricular wall thickness. Right ventricular systolic function is normal. There is moderately elevated pulmonary artery systolic pressure. The tricuspid regurgitant velocity is 2.92 m/s, and with an assumed right atrial pressure of 15 mmHg, the estimated right ventricular systolic pressure is 78.5 mmHg. Left Atrium: Left atrial size was severely dilated. Right Atrium: Right atrial size was severely dilated. Pericardium: There is no evidence of  pericardial effusion. Presence of pericardial fat pad. Mitral Valve: The mitral valve is degenerative in appearance. There is mild thickening of the mitral valve leaflet(s). Mild to moderate mitral annular calcification. Moderate mitral valve regurgitation. Tricuspid Valve: The tricuspid valve is grossly normal. Tricuspid valve regurgitation is moderate. Aortic Valve: The aortic valve is tricuspid. Aortic valve regurgitation is not visualized. Mild to moderate aortic valve sclerosis/calcification is present, without any evidence of aortic stenosis. Pulmonic Valve: The pulmonic valve was grossly normal. Pulmonic valve regurgitation is trivial. Aorta: The aortic root is normal in size and structure. Venous: The inferior vena cava is dilated in size with less than 50% respiratory variability, suggesting right atrial pressure of 15 mmHg. IAS/Shunts: No atrial level shunt detected by color flow Doppler.  LEFT VENTRICLE PLAX 2D LVIDd:         6.22 cm LVIDs:         3.83 cm LV PW:         1.11 cm LV IVS:        1.19 cm LVOT diam:     1.90 cm LV SV:         56.99 ml LV SV Index:   28.18 LVOT Area:     2.84 cm  LV Volumes (MOD) LV vol d, MOD A2C: 176.0 ml LV vol d, MOD A4C: 140.0 ml LV vol s, MOD A2C: 77.2 ml LV vol s, MOD A4C: 80.6 ml LV SV MOD A2C:     98.8 ml LV SV MOD A4C:     140.0 ml LV SV MOD BP:      80.9 ml RIGHT VENTRICLE TAPSE (M-mode): 1.2 cm LEFT ATRIUM              Index       RIGHT ATRIUM           Index LA diam:        4.20 cm  2.08 cm/m  RA Area:     29.30 cm LA Vol (A2C):   100.0 ml 49.44 ml/m RA Volume:   111.00 ml 54.88 ml/m LA Vol (A4C):   129.0 ml 63.78 ml/m LA Biplane Vol: 126.0 ml 62.30 ml/m  AORTIC VALVE LVOT Vmax:   104.00 cm/s LVOT Vmean:  71.800 cm/s LVOT VTI:    0.201 m  AORTA Ao Root diam: 3.20 cm MITRAL VALVE                 TRICUSPID VALVE MV Area (PHT): 5.27 cm      TR Peak grad:   34.1 mmHg MV Decel Time: 144 msec      TR Vmax:        292.00 cm/s MR Peak grad:    69.9 mmHg MR Mean  grad:    48.0 mmHg  SHUNTS MR Vmax:         418.00 cm/s Systemic VTI:  0.20 m MR Vmean:        327.0 cm/s  Systemic Diam: 1.90 cm MR PISA:         1.01 cm MR PISA Eff ROA: 6 mm MR PISA Radius:  0.40 cm MV E velocity: 148.00 cm/s Rozann Lesches MD Electronically signed by Rozann Lesches MD Signature Date/Time: 09/27/2019/2:42:43 PM    Final    Micro Results   Recent Results (from the past 240 hour(s))  Respiratory Panel by RT PCR (Flu A&B, Covid) - Nasopharyngeal Swab     Status: None   Collection Time: 09/22/19  5:37 AM   Specimen: Nasopharyngeal Swab  Result Value Ref Range Status   SARS Coronavirus 2 by RT PCR NEGATIVE NEGATIVE Final    Comment: (NOTE) SARS-CoV-2 target nucleic acids are NOT DETECTED. The SARS-CoV-2 RNA is generally detectable in upper respiratoy specimens during the acute phase of infection. The lowest concentration of SARS-CoV-2 viral copies this assay can detect is 131 copies/mL. A negative result does not preclude SARS-Cov-2 infection and should not be used as the sole basis for treatment or other patient management decisions. A negative result may occur with  improper specimen collection/handling, submission of specimen other than nasopharyngeal swab, presence of viral mutation(s) within the areas targeted by this assay, and inadequate number of viral copies (<131 copies/mL). A negative result must be combined with clinical observations, patient history, and epidemiological information. The expected result is Negative. Fact Sheet for Patients:  PinkCheek.be Fact Sheet for Healthcare Providers:  GravelBags.it This test is not yet ap proved or cleared by the [place] FDA and  has been authorized for detection and/or diagnosis of SARS-CoV-2 by FDA under an Emergency Use Authorization (EUA). This EUA will remain  in effect (meaning this test can be used) for the duration of the COVID-19 declaration  under Section 564(b)(1) of the Act, 21 U.S.C. section 360bbb-3(b)(1), unless the authorization is terminated or revoked sooner.    Influenza A by PCR NEGATIVE NEGATIVE Final   Influenza B by PCR NEGATIVE NEGATIVE Final    Comment: (NOTE) The Xpert Xpress SARS-CoV-2/FLU/RSV assay is intended as an aid in  the diagnosis of influenza from Nasopharyngeal swab specimens and  should not be used as a sole basis for treatment. Nasal washings and  aspirates are unacceptable for Xpert Xpress SARS-CoV-2/FLU/RSV  testing. Fact Sheet for Patients: PinkCheek.be Fact Sheet for Healthcare Providers: GravelBags.it This test is not yet approved or cleared by the [place] FDA and  has been authorized for detection and/or diagnosis of SARS-CoV-2 by  FDA under an Emergency Use Authorization (EUA). This EUA will remain  in effect (meaning this test can be used) for the duration of the  Covid-19 declaration under Section 564(b)(1) of the Act, 21  U.S.C. section 360bbb-3(b)(1), unless the authorization is  terminated or revoked. Performed at [facility], [address], [place], [place] [address]   Blood culture (routine x 2)     Status: None   Collection Time: 09/22/19  6:32 AM   Specimen: BLOOD  Result Value Ref Range Status   Specimen Description BLOOD LEFT HAND  Final   Special Requests   Final    BOTTLES DRAWN AEROBIC AND ANAEROBIC Blood Culture adequate volume   Culture   Final    NO GROWTH 5 DAYS Performed at [facility], [address] [place], [place] [address]    Report Status  09/27/2019 FINAL  Final  Blood culture (routine x 2)     Status: None   Collection Time: 09/22/19  6:39 AM   Specimen: BLOOD  Result Value Ref Range Status   Specimen Description BLOOD LEFT HAND  Final   Special Requests   Final    BOTTLES DRAWN AEROBIC AND ANAEROBIC Blood Culture adequate volume   Culture   Final    NO GROWTH 5 DAYS Performed at  [facility], [address], [place], [place] [address]    Report Status 09/27/2019 FINAL  Final  MRSA PCR Screening     Status: None   Collection Time: 09/22/19  8:45 PM   Specimen: Nasal Mucosa; Nasopharyngeal  Result Value Ref Range Status   MRSA by PCR NEGATIVE NEGATIVE Final    Comment:        The GeneXpert MRSA Assay (FDA approved for NASAL specimens only), is one component of a comprehensive MRSA colonization surveillance program. It is not intended to diagnose MRSA infection nor to guide or monitor treatment for MRSA infections. Performed at [facility], [address], [place], [place] [address]    Today   Subjective    Sabrina Beck today has no new complaints, -Cough and shortness of breath is improved -With ambulation desaturation noted however this is correctable with 2 L of oxygen via nasal cannula       Patient has been seen and examined prior to discharge   Objective   Blood pressure 129/87, pulse 76, temperature 98 F (36.7 C), temperature source Oral, resp. rate 20, height _0  (1.626 m), weight 99.6 kg, SpO2 94 %.   Intake/Output Summary (Last 24 hours) at 09/30/2019 1509 Last data filed at 09/30/2019 0900 Gross per 24 hour  Intake 360 ml  Output 1000 ml  Net -640 ml    Exam Gen:- Awake Alert, no acute distress  HEENT:- [place].AT, No sclera icterus Neck-Supple Neck,No JVD,.  Lungs-improved air movement, no wheezing CV- S1, S2 normal, regular Abd-  +ve B.Sounds, Abd Soft, No tenderness,   Extremity/Skin:- No  edema,   good pulses, Lt UE AVF with positive thrill and bruit Psych-affect is appropriate, oriented x3 Neuro-no new focal deficits, no tremors    Data Review   CBC w Diff:  Lab Results  Component Value Date   WBC 8.6 09/29/2019   HGB 10.1 (L) 09/29/2019   HCT 30.8 (L) 09/29/2019   HCT 28.8 (L) 04/09/2019   PLT 182 09/29/2019   LYMPHOPCT 21 09/22/2019   MONOPCT 8 09/22/2019   EOSPCT 1 09/22/2019   BASOPCT 1 09/22/2019    CMP:   Lab Results  Component Value Date   NA 136 09/29/2019   NA 142 06/08/2015   K 4.4 09/29/2019   CL 89 (L) 09/29/2019   CO2 26 09/29/2019   BUN 77 (H) 09/29/2019   BUN 37 (H) 06/08/2015   CREATININE 10.95 (H) 09/29/2019   PROT 6.4 (L) 09/23/2019   PROT 6.1 06/08/2015   ALBUMIN 3.5 09/29/2019   ALBUMIN 3.9 06/08/2015   BILITOT 1.0 09/23/2019   BILITOT 0.5 06/08/2015   ALKPHOS 63 09/23/2019   AST 21 09/23/2019   ALT 21 09/23/2019  .   Total Discharge time is about 33 minutes  Roxan Hockey M.D on 09/30/2019 at 3:09 PM  Go to [email or web address] -  for contact info  [facility] - Office  [phone]

## 2019-09-30 NOTE — Plan of Care (Signed)
  Problem: Acute Rehab PT Goals(only PT should resolve) Goal: Patient Will Transfer Sit To/From Stand Outcome: Progressing Flowsheets (Taken 09/30/2019 1109) Patient will transfer sit to/from stand: with supervision Goal: Pt Will Transfer Bed To Chair/Chair To Bed Flowsheets (Taken 09/30/2019 1109) Pt will Transfer Bed to Chair/Chair to Bed: with supervision Goal: Pt Will Ambulate Flowsheets (Taken 09/30/2019 1109) Pt will Ambulate:  100 feet  with min guard assist  with rolling walker   Tori Telvin Reinders PT, DPT 09/30/19, 11:09 AM [phone]

## 2019-09-30 NOTE — Evaluation (Addendum)
Physical Therapy Evaluation Patient Details Name: Sabrina Beck MRN: [id number] DOB: Nov 26, 1940 Today's Date: 09/30/2019   History of Present Illness  Sabrina Beck  is a 79 y.o. female with past medical history relevant for chronic A. fib on Coumadin for anticoagulation and metoprolol for rate control, as well as history of ESRD with hemodialysis on Mondays Wednesdays and Fridays, COPD, dCHF and anemia of ESRD who presents with shortness of breath and found to be hypoxic in the ED requiring up to 4 L of oxygen via nasal cannula in the setting of bilateral pneumonia-Patient met sepsis criteria with tachycardia, tachypnea as well as hypoxia, on leukocytosis, elevated lactic acid,-Chest x-ray consistent with bilateral pneumonia-Patient is found to be in A. fib with RVR with heart rate in the 140s in the ED initially treated with IV Cardizem but had hypotension-INR is 2.1, WBC is 11    Clinical Impression  Pt admitted with above diagnosis. Pt with mild SOB with OOB activity, MD entered room and pt able to maintain standing for ~4 minutes while talking to MD and no unsteadiness or dizziness noted. Pt's O2 sat 82-90% on RA throughout evaluation, but denies dizziness; constant pursed lip breathing cues to improve saturation but unable to maintain 90% for duration so reapplied 1.5LPM O2 and O2 sat 92-94%; nurse tech notified of O2 sat and O2 reapplied. Pt tolerates remaining up in bedside chair at EOS. Pt currently with functional limitations due to the deficits listed below (see PT Problem List). Pt will benefit from skilled PT to increase their independence and safety with mobility to allow discharge to the venue listed below.       Follow Up Recommendations Home health PT;Supervision for mobility/OOB    Equipment Recommendations  None recommended by PT    Recommendations for Other Services       Precautions / Restrictions Precautions Precautions: Fall Restrictions Weight Bearing Restrictions: No       Mobility  Bed Mobility Overal bed mobility: Needs Assistance Bed Mobility: Supine to Sit     Supine to sit: Min guard     General bed mobility comments: slow, labored movement, on RA with O2 sat 84%  Transfers Overall transfer level: Needs assistance Equipment used: Rolling walker (2 wheeled) Transfers: Sit to/from [facility] Sit to Stand: Min assist Stand pivot transfers: Min assist       General transfer comment: slow, labored movement, min assist to power up and verbal cues to push from bed, steadiness upon standing and denies dizziness  Ambulation/Gait Ambulation/Gait assistance: Min assist Gait Distance (Feet): 15 Feet Assistive device: Rolling walker (2 wheeled) Gait Pattern/deviations: Decreased step length - right;Decreased step length - left;Decreased stride length Gait velocity: decreased   General Gait Details: slow, steps in room with dependence on RW for steadying, no loss of balance, on RA and O2 sat 82-90% with pursed lip breathing cues  Stairs            Wheelchair Mobility    Modified Rankin (Stroke Patients Only)       Balance Overall balance assessment: Needs assistance Sitting-balance support: Feet supported;No upper extremity supported Sitting balance-Leahy Scale: Good Sitting balance - Comments: seated EOB   Standing balance support: During functional activity;Bilateral upper extremity supported Standing balance-Leahy Scale: Poor Standing balance comment: fair/poor with RW              Pertinent Vitals/Pain Pain Assessment: No/denies pain    Home Living Family/patient expects to be discharged to:: Private residence Living  Arrangements: Children(son) Available Help at Discharge: Available 24 hours/day Type of Home: House Home Access: Stairs to enter Entrance Stairs-Rails: None Entrance Stairs-Number of Steps: 1 Home Layout: One level Home Equipment: Shower seat - built in;Walker - 2 wheels;Cane -  quad;Hand held shower head;Grab bars - tub/shower      Prior Function Level of Independence: Independent with assistive device(s)         Comments: Pt reports independent with bathing, dressing, ambulating with SPC in house and community, transfers. Pt reports son completes cooking, cleaning, laundry and driving and is home 78/4. Pt denies home O2 use.     Hand Dominance        Extremity/Trunk Assessment   Upper Extremity Assessment Upper Extremity Assessment: Overall WFL for tasks assessed    Lower Extremity Assessment Lower Extremity Assessment: Generalized weakness(c/o neuropathy in BLE)    Cervical / Trunk Assessment Cervical / Trunk Assessment: Normal  Communication   Communication: No difficulties  Cognition Arousal/Alertness: Awake/alert Behavior During Therapy: WFL for tasks assessed/performed Overall Cognitive Status: Within Functional Limits for tasks assessed                 General Comments General comments (skin integrity, edema, etc.): on RA O2 sat 82-90% with pursed lip breathing cues and OOB activity, returned 1.5 LPM O2 and O2 sat 92-94%    Exercises     Assessment/Plan    PT Assessment Patient needs continued PT services  PT Problem List Decreased strength;Decreased range of motion;Decreased activity tolerance;Decreased balance;Decreased mobility;Cardiopulmonary status limiting activity       PT Treatment Interventions DME instruction;Gait training;Functional mobility training;Therapeutic activities;Therapeutic exercise;Balance training;Neuromuscular re-education;Patient/family education;Manual techniques    PT Goals (Current goals can be found in the Care Plan section)  Acute Rehab PT Goals Patient Stated Goal: return home with son PT Goal Formulation: With patient Time For Goal Achievement: 10/07/19 Potential to Achieve Goals: Good    Frequency Min 3X/week   Barriers to discharge        Co-evaluation               AM-PAC  PT "6 Clicks" Mobility  Outcome Measure Help needed turning from your back to your side while in a flat bed without using bedrails?: A Little Help needed moving from lying on your back to sitting on the side of a flat bed without using bedrails?: A Little Help needed moving to and from a bed to a chair (including a wheelchair)?: A Little Help needed standing up from a chair using your arms (e.g., wheelchair or bedside chair)?: A Little Help needed to walk in hospital room?: A Little Help needed climbing 3-5 steps with a railing? : A Little 6 Click Score: 18    End of Session Equipment Utilized During Treatment: Gait belt;Oxygen Activity Tolerance: Patient tolerated treatment well;Patient limited by fatigue Patient left: in chair;with call bell/phone within reach Nurse Communication: Mobility status;Other (comment)(reapplied 1.5LPM O2 due to O2 sat) PT Visit Diagnosis: Unsteadiness on feet (R26.81);Other abnormalities of gait and mobility (R26.89);Muscle weakness (generalized) (M62.81)    Time: 6962-9528 PT Time Calculation (min) (ACUTE ONLY): 32 min   Charges:   PT Evaluation $PT Eval Moderate Complexity: 1 Mod PT Treatments $Therapeutic Activity: 8-22 mins         Tori Alfreddie Consalvo PT, DPT 09/30/19, 11:07 AM [phone]

## 2019-09-30 NOTE — Progress Notes (Signed)
  SATURATION QUALIFICATIONS: (Thisnote is usedto comply with regulatory documentation for home oxygen)  Patient Saturations on Room Air at Rest =90 %  Patient Saturations on Room Air while Ambulating =82%  Patient Saturations on2Liters of oxygen while Ambulating = 93%   Diagnosis-- CHF Patient needs continuous O2 at 2 L/min continuously via nasal cannula with humidifier, with gaseous portability and conserving device

## 2019-09-30 NOTE — Progress Notes (Signed)
ANTICOAGULATION CONSULT NOTE -  Pharmacy Consult for warfarin Indication: atrial fibrillation  No Known Allergies  Patient Measurements: Height: 5\' 4"  (162.6 cm) Weight: 219 lb 9.3 oz (99.6 kg) IBW/kg (Calculated) : 54.7   Vital Signs: Temp: 97.5 F (36.4 C) (02/23 0502) Temp Source: Oral (02/23 0502) BP: 116/61 (02/23 0502) Pulse Rate: 94 (02/23 0502)  Labs: Recent Labs    09/28/19 0519 09/29/19 0520 09/30/19 0506  HGB 10.1* 10.1*  --   HCT 30.5* 30.8*  --   PLT 186 182  --   LABPROT 25.2* 24.1* 23.8*  INR 2.3* 2.2* 2.1*  CREATININE 8.56* 10.95*  --     Estimated Creatinine Clearance: 4.7 mL/min (A) (by C-G formula based on SCr of 10.95 mg/dL (H)).   Medical History: Past Medical History:  Diagnosis Date  . Anemia   . Atrial fibrillation ([place])   . Chest pain 05/15/2006   Stress test negative for ischemia  . Diastolic heart failure ([place])   . Diverticulosis   . ESRD on hemodialysis ([place])   . GERD (gastroesophageal reflux disease)   . Headache   . Mixed hyperlipidemia   . Peripheral neuropathy   . PONV (postoperative nausea and vomiting)   . Systemic hypertension   . Uterine cancer ([facility])     Medications:  Medications Prior to Admission  Medication Sig Dispense Refill Last Dose  . acetaminophen (TYLENOL) 500 MG tablet Take 1,000 mg by mouth every 6 (six) hours as needed for moderate pain.    Past Week at Unknown time  . albuterol (VENTOLIN HFA) 108 (90 Base) MCG/ACT inhaler Inhale 2 puffs into the lungs every 4 (four) hours as needed for wheezing or shortness of breath (cough, shortness of breath or wheezing.). 18 g 1 09/22/2019 at Unknown time  . Cholecalciferol (VITAMIN D) 2000 UNITS CAPS Take 2,000 Units by mouth every evening.    09/21/2019 at Unknown time  . fluticasone (FLOVENT HFA) 44 MCG/ACT inhaler Inhale 2 puffs into the lungs 2 (two) times daily. 1 Inhaler 2 Past Month at Unknown time  . gabapentin (NEURONTIN) 100 MG capsule Take 100 mg by mouth 2  (two) times daily.    09/21/2019 at Unknown time  . metoprolol tartrate (LOPRESSOR) 25 MG tablet Take 1 tablet (25 mg total) by mouth 2 (two) times daily. 60 tablet 1 09/21/2019 at 1830  . Multiple Vitamins-Minerals (PRESERVISION AREDS 2 PO) Take 1 tablet by mouth 2 (two) times daily.   09/21/2019 at Unknown time  . multivitamin (RENA-VIT) TABS tablet Take 1 tablet by mouth every evening.    09/21/2019 at Unknown time  . Omega-3 Fatty Acids (OMEGA-3 FISH OIL) 300 MG CAPS Take 300 mg by mouth 2 (two) times daily.   09/21/2019 at Unknown time  . omeprazole (PRILOSEC) 20 MG capsule Take 20 mg by mouth every morning.    09/21/2019 at Unknown time  . Propylene Glycol (SYSTANE BALANCE) 0.6 % SOLN Place 1 drop into both eyes 3 (three) times daily as needed (for burning or dry eyes).    Past Month at Unknown time  . RENVELA 800 MG tablet Take 2,400 mg by mouth 3 (three) times daily with meals. Takes 1 tablet with snacks   09/21/2019 at Unknown time  . warfarin (COUMADIN) 5 MG tablet TAKE 1 & 1/2 TABLETS BY MOUTH (ONE & ONE-HALF) TO 2 TABLETS ONCE DAILY AS DIRECTED BY COUMADIN CLINIC (Patient taking differently: Take 7.5-10 mg by mouth daily at 6 PM. TAKE 2 TABLET BY MOUTH  ON Monday AND 1 1/2 TABLETS BY MOUTH (ONE & ONE-HALF) ON Tuesday, Wednesday, Thursday,  Friday, Saturday, Sunday.) 150 tablet 0 09/21/2019 at 1830  . predniSONE (STERAPRED UNI-PAK 21 TAB) 10 MG (21) TBPK tablet Take by mouth daily. Take 6 tabs by mouth daily  for 2 days, then 5 tabs for 2 days, then 4 tabs for 2 days, then 3 tabs for 2 days, 2 tabs for 2 days, then 1 tab by mouth daily for 2 days (Patient not taking: Reported on 09/22/2019) 42 tablet 0 Not Taking at Unknown time    Assessment: Pharmacy consulted to dose warfarin in patient with atrial fibrillation.   Coumadin 10mg  dose ordered 2/15 not given. Amiodarone discontinued, can likely go back to home dosing. INR is now therapeutic at 2.1.   Home dose listed as 10 mg every Mon and 7.5 mg  ROW per anticoag clinic.  Goal of Therapy:  INR 2-3 Monitor platelets by anticoagulation protocol: Yes   Plan:  Warfarin 10 mg x 1 dose.  Monitor daily INR and s/s of bleeding.  Margot Ables, PharmD Clinical Pharmacist 09/30/2019 8:12 AM

## 2019-09-30 NOTE — Discharge Instructions (Signed)
1)Very low-salt diet advised 2)Weigh yourself daily, call if you gain more than 3 pounds in 1 day or more than 5 pounds in 1 week as your medications may need to be adjusted 3)Limit your Fluid  intake to no more than 60 ounces (1.8 Liters) per day

## 2019-09-30 NOTE — Care Management Important Message (Signed)
Important Message  Patient Details  Name: Sabrina Beck MRN: [id number] Date of Birth: 1941-05-25   Medicare Important Message Given:  Yes     Tommy Medal 09/30/2019, 3:53 PM

## 2019-10-01 ENCOUNTER — Telehealth: Payer: Self-pay | Admitting: Cardiovascular Disease

## 2019-10-01 DIAGNOSIS — D631 Anemia in chronic kidney disease: Secondary | ICD-10-CM | POA: Diagnosis not present

## 2019-10-01 DIAGNOSIS — K219 Gastro-esophageal reflux disease without esophagitis: Secondary | ICD-10-CM | POA: Diagnosis not present

## 2019-10-01 DIAGNOSIS — E782 Mixed hyperlipidemia: Secondary | ICD-10-CM | POA: Diagnosis not present

## 2019-10-01 DIAGNOSIS — K5790 Diverticulosis of intestine, part unspecified, without perforation or abscess without bleeding: Secondary | ICD-10-CM | POA: Diagnosis not present

## 2019-10-01 DIAGNOSIS — T80818D Extravasation of other vesicant agent, subsequent encounter: Secondary | ICD-10-CM | POA: Diagnosis not present

## 2019-10-01 DIAGNOSIS — K559 Vascular disorder of intestine, unspecified: Secondary | ICD-10-CM | POA: Diagnosis not present

## 2019-10-01 DIAGNOSIS — N2581 Secondary hyperparathyroidism of renal origin: Secondary | ICD-10-CM | POA: Diagnosis not present

## 2019-10-01 DIAGNOSIS — Z992 Dependence on renal dialysis: Secondary | ICD-10-CM | POA: Diagnosis not present

## 2019-10-01 DIAGNOSIS — I4821 Permanent atrial fibrillation: Secondary | ICD-10-CM | POA: Diagnosis not present

## 2019-10-01 DIAGNOSIS — N186 End stage renal disease: Secondary | ICD-10-CM | POA: Diagnosis not present

## 2019-10-01 DIAGNOSIS — I132 Hypertensive heart and chronic kidney disease with heart failure and with stage 5 chronic kidney disease, or end stage renal disease: Secondary | ICD-10-CM | POA: Diagnosis not present

## 2019-10-01 DIAGNOSIS — I5042 Chronic combined systolic (congestive) and diastolic (congestive) heart failure: Secondary | ICD-10-CM | POA: Diagnosis not present

## 2019-10-01 NOTE — Telephone Encounter (Signed)
Asking if patient needs her INR level checked today

## 2019-10-01 NOTE — Telephone Encounter (Signed)
Received call from [facility].  Pt was d/c from hospital yesterday and wants to know if we need INR check today.  Reviewed hospital records.  INR was 2.1 yesterday at discharge.  Pt is not on any antibiotic or steroids.  Warfarin dose was not changed.  Continue warfarin 7.5mg  daily except 10mg  on Mondays and [facility] to check INR next week.  Order given to [facility].

## 2019-10-03 DIAGNOSIS — K559 Vascular disorder of intestine, unspecified: Secondary | ICD-10-CM | POA: Diagnosis not present

## 2019-10-03 DIAGNOSIS — E782 Mixed hyperlipidemia: Secondary | ICD-10-CM | POA: Diagnosis not present

## 2019-10-03 DIAGNOSIS — I4821 Permanent atrial fibrillation: Secondary | ICD-10-CM | POA: Diagnosis not present

## 2019-10-03 DIAGNOSIS — N186 End stage renal disease: Secondary | ICD-10-CM | POA: Diagnosis not present

## 2019-10-03 DIAGNOSIS — K219 Gastro-esophageal reflux disease without esophagitis: Secondary | ICD-10-CM | POA: Diagnosis not present

## 2019-10-03 DIAGNOSIS — I132 Hypertensive heart and chronic kidney disease with heart failure and with stage 5 chronic kidney disease, or end stage renal disease: Secondary | ICD-10-CM | POA: Diagnosis not present

## 2019-10-03 DIAGNOSIS — D631 Anemia in chronic kidney disease: Secondary | ICD-10-CM | POA: Diagnosis not present

## 2019-10-03 DIAGNOSIS — K5790 Diverticulosis of intestine, part unspecified, without perforation or abscess without bleeding: Secondary | ICD-10-CM | POA: Diagnosis not present

## 2019-10-03 DIAGNOSIS — I5042 Chronic combined systolic (congestive) and diastolic (congestive) heart failure: Secondary | ICD-10-CM | POA: Diagnosis not present

## 2019-10-03 DIAGNOSIS — Z992 Dependence on renal dialysis: Secondary | ICD-10-CM | POA: Diagnosis not present

## 2019-10-03 DIAGNOSIS — T80818D Extravasation of other vesicant agent, subsequent encounter: Secondary | ICD-10-CM | POA: Diagnosis not present

## 2019-10-05 DIAGNOSIS — Z992 Dependence on renal dialysis: Secondary | ICD-10-CM | POA: Diagnosis not present

## 2019-10-05 DIAGNOSIS — N186 End stage renal disease: Secondary | ICD-10-CM | POA: Diagnosis not present

## 2019-10-06 DIAGNOSIS — I10 Essential (primary) hypertension: Secondary | ICD-10-CM | POA: Diagnosis not present

## 2019-10-06 DIAGNOSIS — Z6839 Body mass index (BMI) 39.0-39.9, adult: Secondary | ICD-10-CM | POA: Diagnosis not present

## 2019-10-06 DIAGNOSIS — E785 Hyperlipidemia, unspecified: Secondary | ICD-10-CM | POA: Diagnosis not present

## 2019-10-06 DIAGNOSIS — Z992 Dependence on renal dialysis: Secondary | ICD-10-CM | POA: Diagnosis not present

## 2019-10-06 DIAGNOSIS — K219 Gastro-esophageal reflux disease without esophagitis: Secondary | ICD-10-CM | POA: Diagnosis not present

## 2019-10-06 DIAGNOSIS — I509 Heart failure, unspecified: Secondary | ICD-10-CM | POA: Diagnosis not present

## 2019-10-06 DIAGNOSIS — N185 Chronic kidney disease, stage 5: Secondary | ICD-10-CM | POA: Diagnosis not present

## 2019-10-06 DIAGNOSIS — Z0001 Encounter for general adult medical examination with abnormal findings: Secondary | ICD-10-CM | POA: Diagnosis not present

## 2019-10-06 DIAGNOSIS — D689 Coagulation defect, unspecified: Secondary | ICD-10-CM | POA: Diagnosis not present

## 2019-10-06 DIAGNOSIS — J189 Pneumonia, unspecified organism: Secondary | ICD-10-CM | POA: Diagnosis not present

## 2019-10-06 DIAGNOSIS — N186 End stage renal disease: Secondary | ICD-10-CM | POA: Diagnosis not present

## 2019-10-06 DIAGNOSIS — Z1389 Encounter for screening for other disorder: Secondary | ICD-10-CM | POA: Diagnosis not present

## 2019-10-06 DIAGNOSIS — E039 Hypothyroidism, unspecified: Secondary | ICD-10-CM | POA: Diagnosis not present

## 2019-10-07 ENCOUNTER — Telehealth: Payer: Self-pay | Admitting: *Deleted

## 2019-10-07 ENCOUNTER — Ambulatory Visit (INDEPENDENT_AMBULATORY_CARE_PROVIDER_SITE_OTHER): Payer: Medicare HMO | Admitting: *Deleted

## 2019-10-07 DIAGNOSIS — K559 Vascular disorder of intestine, unspecified: Secondary | ICD-10-CM | POA: Diagnosis not present

## 2019-10-07 DIAGNOSIS — D631 Anemia in chronic kidney disease: Secondary | ICD-10-CM | POA: Diagnosis not present

## 2019-10-07 DIAGNOSIS — K219 Gastro-esophageal reflux disease without esophagitis: Secondary | ICD-10-CM | POA: Diagnosis not present

## 2019-10-07 DIAGNOSIS — Z5181 Encounter for therapeutic drug level monitoring: Secondary | ICD-10-CM

## 2019-10-07 DIAGNOSIS — E782 Mixed hyperlipidemia: Secondary | ICD-10-CM | POA: Diagnosis not present

## 2019-10-07 DIAGNOSIS — K5790 Diverticulosis of intestine, part unspecified, without perforation or abscess without bleeding: Secondary | ICD-10-CM | POA: Diagnosis not present

## 2019-10-07 DIAGNOSIS — N186 End stage renal disease: Secondary | ICD-10-CM | POA: Diagnosis not present

## 2019-10-07 DIAGNOSIS — I5042 Chronic combined systolic (congestive) and diastolic (congestive) heart failure: Secondary | ICD-10-CM | POA: Diagnosis not present

## 2019-10-07 DIAGNOSIS — I4891 Unspecified atrial fibrillation: Secondary | ICD-10-CM

## 2019-10-07 DIAGNOSIS — I132 Hypertensive heart and chronic kidney disease with heart failure and with stage 5 chronic kidney disease, or end stage renal disease: Secondary | ICD-10-CM | POA: Diagnosis not present

## 2019-10-07 DIAGNOSIS — I4821 Permanent atrial fibrillation: Secondary | ICD-10-CM | POA: Diagnosis not present

## 2019-10-07 DIAGNOSIS — T80818D Extravasation of other vesicant agent, subsequent encounter: Secondary | ICD-10-CM | POA: Diagnosis not present

## 2019-10-07 LAB — POCT INR: INR: 3 (ref 2.0–3.0)

## 2019-10-07 NOTE — Patient Instructions (Signed)
Spoke with [facility].  Instructed pt to continue warfarin 1.5  tablets daily except 2 tablets on Mondays.  Recheck INR in 1 week. Dialysis on M,W,F

## 2019-10-07 NOTE — Telephone Encounter (Signed)
Done.  See coumadin note. 

## 2019-10-07 NOTE — Telephone Encounter (Signed)
Jessica with [facility] [phone]  PT   35.7 INR   3.0

## 2019-10-08 ENCOUNTER — Other Ambulatory Visit (HOSPITAL_COMMUNITY): Payer: Self-pay | Admitting: Internal Medicine

## 2019-10-08 DIAGNOSIS — Z992 Dependence on renal dialysis: Secondary | ICD-10-CM | POA: Diagnosis not present

## 2019-10-08 DIAGNOSIS — N186 End stage renal disease: Secondary | ICD-10-CM | POA: Diagnosis not present

## 2019-10-08 DIAGNOSIS — E2839 Other primary ovarian failure: Secondary | ICD-10-CM

## 2019-10-08 DIAGNOSIS — Z1231 Encounter for screening mammogram for malignant neoplasm of breast: Secondary | ICD-10-CM

## 2019-10-09 DIAGNOSIS — N186 End stage renal disease: Secondary | ICD-10-CM | POA: Diagnosis not present

## 2019-10-09 DIAGNOSIS — K219 Gastro-esophageal reflux disease without esophagitis: Secondary | ICD-10-CM | POA: Diagnosis not present

## 2019-10-09 DIAGNOSIS — I132 Hypertensive heart and chronic kidney disease with heart failure and with stage 5 chronic kidney disease, or end stage renal disease: Secondary | ICD-10-CM | POA: Diagnosis not present

## 2019-10-09 DIAGNOSIS — K559 Vascular disorder of intestine, unspecified: Secondary | ICD-10-CM | POA: Diagnosis not present

## 2019-10-09 DIAGNOSIS — D631 Anemia in chronic kidney disease: Secondary | ICD-10-CM | POA: Diagnosis not present

## 2019-10-09 DIAGNOSIS — I5042 Chronic combined systolic (congestive) and diastolic (congestive) heart failure: Secondary | ICD-10-CM | POA: Diagnosis not present

## 2019-10-09 DIAGNOSIS — T80818D Extravasation of other vesicant agent, subsequent encounter: Secondary | ICD-10-CM | POA: Diagnosis not present

## 2019-10-09 DIAGNOSIS — E782 Mixed hyperlipidemia: Secondary | ICD-10-CM | POA: Diagnosis not present

## 2019-10-09 DIAGNOSIS — K5790 Diverticulosis of intestine, part unspecified, without perforation or abscess without bleeding: Secondary | ICD-10-CM | POA: Diagnosis not present

## 2019-10-09 DIAGNOSIS — I4821 Permanent atrial fibrillation: Secondary | ICD-10-CM | POA: Diagnosis not present

## 2019-10-10 DIAGNOSIS — Z992 Dependence on renal dialysis: Secondary | ICD-10-CM | POA: Diagnosis not present

## 2019-10-10 DIAGNOSIS — N186 End stage renal disease: Secondary | ICD-10-CM | POA: Diagnosis not present

## 2019-10-13 DIAGNOSIS — Z992 Dependence on renal dialysis: Secondary | ICD-10-CM | POA: Diagnosis not present

## 2019-10-13 DIAGNOSIS — N186 End stage renal disease: Secondary | ICD-10-CM | POA: Diagnosis not present

## 2019-10-13 DIAGNOSIS — N2581 Secondary hyperparathyroidism of renal origin: Secondary | ICD-10-CM | POA: Diagnosis not present

## 2019-10-14 ENCOUNTER — Ambulatory Visit (INDEPENDENT_AMBULATORY_CARE_PROVIDER_SITE_OTHER): Payer: Medicare HMO | Admitting: *Deleted

## 2019-10-14 DIAGNOSIS — I132 Hypertensive heart and chronic kidney disease with heart failure and with stage 5 chronic kidney disease, or end stage renal disease: Secondary | ICD-10-CM | POA: Diagnosis not present

## 2019-10-14 DIAGNOSIS — I4891 Unspecified atrial fibrillation: Secondary | ICD-10-CM

## 2019-10-14 DIAGNOSIS — I5042 Chronic combined systolic (congestive) and diastolic (congestive) heart failure: Secondary | ICD-10-CM | POA: Diagnosis not present

## 2019-10-14 DIAGNOSIS — I4821 Permanent atrial fibrillation: Secondary | ICD-10-CM | POA: Diagnosis not present

## 2019-10-14 DIAGNOSIS — J9601 Acute respiratory failure with hypoxia: Secondary | ICD-10-CM | POA: Diagnosis not present

## 2019-10-14 DIAGNOSIS — K5790 Diverticulosis of intestine, part unspecified, without perforation or abscess without bleeding: Secondary | ICD-10-CM | POA: Diagnosis not present

## 2019-10-14 DIAGNOSIS — E782 Mixed hyperlipidemia: Secondary | ICD-10-CM | POA: Diagnosis not present

## 2019-10-14 DIAGNOSIS — D631 Anemia in chronic kidney disease: Secondary | ICD-10-CM | POA: Diagnosis not present

## 2019-10-14 DIAGNOSIS — Z5181 Encounter for therapeutic drug level monitoring: Secondary | ICD-10-CM | POA: Diagnosis not present

## 2019-10-14 DIAGNOSIS — T80818D Extravasation of other vesicant agent, subsequent encounter: Secondary | ICD-10-CM | POA: Diagnosis not present

## 2019-10-14 DIAGNOSIS — K219 Gastro-esophageal reflux disease without esophagitis: Secondary | ICD-10-CM | POA: Diagnosis not present

## 2019-10-14 DIAGNOSIS — I5032 Chronic diastolic (congestive) heart failure: Secondary | ICD-10-CM | POA: Diagnosis not present

## 2019-10-14 DIAGNOSIS — N186 End stage renal disease: Secondary | ICD-10-CM | POA: Diagnosis not present

## 2019-10-14 DIAGNOSIS — K559 Vascular disorder of intestine, unspecified: Secondary | ICD-10-CM | POA: Diagnosis not present

## 2019-10-14 LAB — POCT INR: INR: 2 (ref 2.0–3.0)

## 2019-10-14 NOTE — Patient Instructions (Signed)
Spoke with [facility].  Instructed pt to continue warfarin 1.5  tablets daily except 2 tablets on Mondays.  Recheck INR in 1 week. Dialysis on M,W,F

## 2019-10-15 DIAGNOSIS — N186 End stage renal disease: Secondary | ICD-10-CM | POA: Diagnosis not present

## 2019-10-15 DIAGNOSIS — Z992 Dependence on renal dialysis: Secondary | ICD-10-CM | POA: Diagnosis not present

## 2019-10-16 DIAGNOSIS — I4821 Permanent atrial fibrillation: Secondary | ICD-10-CM | POA: Diagnosis not present

## 2019-10-16 DIAGNOSIS — K5790 Diverticulosis of intestine, part unspecified, without perforation or abscess without bleeding: Secondary | ICD-10-CM | POA: Diagnosis not present

## 2019-10-16 DIAGNOSIS — E782 Mixed hyperlipidemia: Secondary | ICD-10-CM | POA: Diagnosis not present

## 2019-10-16 DIAGNOSIS — D631 Anemia in chronic kidney disease: Secondary | ICD-10-CM | POA: Diagnosis not present

## 2019-10-16 DIAGNOSIS — T80818D Extravasation of other vesicant agent, subsequent encounter: Secondary | ICD-10-CM | POA: Diagnosis not present

## 2019-10-16 DIAGNOSIS — N186 End stage renal disease: Secondary | ICD-10-CM | POA: Diagnosis not present

## 2019-10-16 DIAGNOSIS — I132 Hypertensive heart and chronic kidney disease with heart failure and with stage 5 chronic kidney disease, or end stage renal disease: Secondary | ICD-10-CM | POA: Diagnosis not present

## 2019-10-16 DIAGNOSIS — K559 Vascular disorder of intestine, unspecified: Secondary | ICD-10-CM | POA: Diagnosis not present

## 2019-10-16 DIAGNOSIS — K219 Gastro-esophageal reflux disease without esophagitis: Secondary | ICD-10-CM | POA: Diagnosis not present

## 2019-10-16 DIAGNOSIS — I5042 Chronic combined systolic (congestive) and diastolic (congestive) heart failure: Secondary | ICD-10-CM | POA: Diagnosis not present

## 2019-10-17 DIAGNOSIS — N186 End stage renal disease: Secondary | ICD-10-CM | POA: Diagnosis not present

## 2019-10-17 DIAGNOSIS — Z992 Dependence on renal dialysis: Secondary | ICD-10-CM | POA: Diagnosis not present

## 2019-10-18 DIAGNOSIS — I132 Hypertensive heart and chronic kidney disease with heart failure and with stage 5 chronic kidney disease, or end stage renal disease: Secondary | ICD-10-CM | POA: Diagnosis not present

## 2019-10-18 DIAGNOSIS — I4821 Permanent atrial fibrillation: Secondary | ICD-10-CM | POA: Diagnosis not present

## 2019-10-18 DIAGNOSIS — I5042 Chronic combined systolic (congestive) and diastolic (congestive) heart failure: Secondary | ICD-10-CM | POA: Diagnosis not present

## 2019-10-18 DIAGNOSIS — N186 End stage renal disease: Secondary | ICD-10-CM | POA: Diagnosis not present

## 2019-10-20 DIAGNOSIS — N186 End stage renal disease: Secondary | ICD-10-CM | POA: Diagnosis not present

## 2019-10-20 DIAGNOSIS — Z992 Dependence on renal dialysis: Secondary | ICD-10-CM | POA: Diagnosis not present

## 2019-10-21 DIAGNOSIS — K5790 Diverticulosis of intestine, part unspecified, without perforation or abscess without bleeding: Secondary | ICD-10-CM | POA: Diagnosis not present

## 2019-10-21 DIAGNOSIS — I5042 Chronic combined systolic (congestive) and diastolic (congestive) heart failure: Secondary | ICD-10-CM | POA: Diagnosis not present

## 2019-10-21 DIAGNOSIS — K559 Vascular disorder of intestine, unspecified: Secondary | ICD-10-CM | POA: Diagnosis not present

## 2019-10-21 DIAGNOSIS — I132 Hypertensive heart and chronic kidney disease with heart failure and with stage 5 chronic kidney disease, or end stage renal disease: Secondary | ICD-10-CM | POA: Diagnosis not present

## 2019-10-21 DIAGNOSIS — E782 Mixed hyperlipidemia: Secondary | ICD-10-CM | POA: Diagnosis not present

## 2019-10-21 DIAGNOSIS — T80818D Extravasation of other vesicant agent, subsequent encounter: Secondary | ICD-10-CM | POA: Diagnosis not present

## 2019-10-21 DIAGNOSIS — K219 Gastro-esophageal reflux disease without esophagitis: Secondary | ICD-10-CM | POA: Diagnosis not present

## 2019-10-21 DIAGNOSIS — N186 End stage renal disease: Secondary | ICD-10-CM | POA: Diagnosis not present

## 2019-10-21 DIAGNOSIS — D631 Anemia in chronic kidney disease: Secondary | ICD-10-CM | POA: Diagnosis not present

## 2019-10-21 DIAGNOSIS — I4821 Permanent atrial fibrillation: Secondary | ICD-10-CM | POA: Diagnosis not present

## 2019-10-22 ENCOUNTER — Ambulatory Visit (INDEPENDENT_AMBULATORY_CARE_PROVIDER_SITE_OTHER): Payer: Medicare HMO | Admitting: *Deleted

## 2019-10-22 ENCOUNTER — Telehealth: Payer: Self-pay | Admitting: Cardiovascular Disease

## 2019-10-22 DIAGNOSIS — Z5181 Encounter for therapeutic drug level monitoring: Secondary | ICD-10-CM

## 2019-10-22 DIAGNOSIS — D631 Anemia in chronic kidney disease: Secondary | ICD-10-CM | POA: Diagnosis not present

## 2019-10-22 DIAGNOSIS — I5042 Chronic combined systolic (congestive) and diastolic (congestive) heart failure: Secondary | ICD-10-CM | POA: Diagnosis not present

## 2019-10-22 DIAGNOSIS — I132 Hypertensive heart and chronic kidney disease with heart failure and with stage 5 chronic kidney disease, or end stage renal disease: Secondary | ICD-10-CM | POA: Diagnosis not present

## 2019-10-22 DIAGNOSIS — I4891 Unspecified atrial fibrillation: Secondary | ICD-10-CM | POA: Diagnosis not present

## 2019-10-22 DIAGNOSIS — K559 Vascular disorder of intestine, unspecified: Secondary | ICD-10-CM | POA: Diagnosis not present

## 2019-10-22 DIAGNOSIS — K219 Gastro-esophageal reflux disease without esophagitis: Secondary | ICD-10-CM | POA: Diagnosis not present

## 2019-10-22 DIAGNOSIS — I4821 Permanent atrial fibrillation: Secondary | ICD-10-CM | POA: Diagnosis not present

## 2019-10-22 DIAGNOSIS — N186 End stage renal disease: Secondary | ICD-10-CM | POA: Diagnosis not present

## 2019-10-22 DIAGNOSIS — T80818D Extravasation of other vesicant agent, subsequent encounter: Secondary | ICD-10-CM | POA: Diagnosis not present

## 2019-10-22 DIAGNOSIS — K5790 Diverticulosis of intestine, part unspecified, without perforation or abscess without bleeding: Secondary | ICD-10-CM | POA: Diagnosis not present

## 2019-10-22 DIAGNOSIS — E782 Mixed hyperlipidemia: Secondary | ICD-10-CM | POA: Diagnosis not present

## 2019-10-22 DIAGNOSIS — Z992 Dependence on renal dialysis: Secondary | ICD-10-CM | POA: Diagnosis not present

## 2019-10-22 LAB — POCT INR: INR: 1.4 — AB (ref 2.0–3.0)

## 2019-10-22 NOTE — Telephone Encounter (Signed)
INR 1.4

## 2019-10-22 NOTE — Telephone Encounter (Signed)
Done.  See coumadin note. 

## 2019-10-22 NOTE — Patient Instructions (Signed)
Spoke with Larene Beach RN [facility].  Instruct pt to take warfarin 2 tablets tonight and tomorrow night then increase dose to 1 1/2  tablets daily except 2 tablets on Mondays and Thursdays.  Recheck INR in 2 weeks in office.  D/C from Home health today. Dialysis on M,W,F

## 2019-10-24 DIAGNOSIS — Z992 Dependence on renal dialysis: Secondary | ICD-10-CM | POA: Diagnosis not present

## 2019-10-24 DIAGNOSIS — N186 End stage renal disease: Secondary | ICD-10-CM | POA: Diagnosis not present

## 2019-10-27 ENCOUNTER — Ambulatory Visit (HOSPITAL_COMMUNITY)
Admission: RE | Admit: 2019-10-27 | Discharge: 2019-10-27 | Disposition: A | Discharge status: Home / Self Care | Payer: Medicare HMO | Source: Ambulatory Visit | Attending: Internal Medicine | Admitting: Internal Medicine

## 2019-10-27 ENCOUNTER — Other Ambulatory Visit: Payer: Self-pay

## 2019-10-27 DIAGNOSIS — N2581 Secondary hyperparathyroidism of renal origin: Secondary | ICD-10-CM | POA: Diagnosis not present

## 2019-10-27 DIAGNOSIS — N186 End stage renal disease: Secondary | ICD-10-CM | POA: Diagnosis not present

## 2019-10-27 DIAGNOSIS — Z1231 Encounter for screening mammogram for malignant neoplasm of breast: Secondary | ICD-10-CM | POA: Insufficient documentation

## 2019-10-27 DIAGNOSIS — E2839 Other primary ovarian failure: Secondary | ICD-10-CM | POA: Insufficient documentation

## 2019-10-27 DIAGNOSIS — Z780 Asymptomatic menopausal state: Secondary | ICD-10-CM | POA: Diagnosis not present

## 2019-10-27 DIAGNOSIS — Z992 Dependence on renal dialysis: Secondary | ICD-10-CM | POA: Diagnosis not present

## 2019-10-27 DIAGNOSIS — M8589 Other specified disorders of bone density and structure, multiple sites: Secondary | ICD-10-CM | POA: Diagnosis not present

## 2019-10-29 DIAGNOSIS — Z992 Dependence on renal dialysis: Secondary | ICD-10-CM | POA: Diagnosis not present

## 2019-10-29 DIAGNOSIS — N186 End stage renal disease: Secondary | ICD-10-CM | POA: Diagnosis not present

## 2019-10-31 DIAGNOSIS — Z992 Dependence on renal dialysis: Secondary | ICD-10-CM | POA: Diagnosis not present

## 2019-10-31 DIAGNOSIS — N186 End stage renal disease: Secondary | ICD-10-CM | POA: Diagnosis not present

## 2019-11-03 DIAGNOSIS — N186 End stage renal disease: Secondary | ICD-10-CM | POA: Diagnosis not present

## 2019-11-03 DIAGNOSIS — I739 Peripheral vascular disease, unspecified: Secondary | ICD-10-CM | POA: Diagnosis not present

## 2019-11-03 DIAGNOSIS — B351 Tinea unguium: Secondary | ICD-10-CM | POA: Diagnosis not present

## 2019-11-03 DIAGNOSIS — L851 Acquired keratosis [keratoderma] palmaris et plantaris: Secondary | ICD-10-CM | POA: Diagnosis not present

## 2019-11-03 DIAGNOSIS — Z992 Dependence on renal dialysis: Secondary | ICD-10-CM | POA: Diagnosis not present

## 2019-11-05 DIAGNOSIS — Z992 Dependence on renal dialysis: Secondary | ICD-10-CM | POA: Diagnosis not present

## 2019-11-05 DIAGNOSIS — N2581 Secondary hyperparathyroidism of renal origin: Secondary | ICD-10-CM | POA: Diagnosis not present

## 2019-11-05 DIAGNOSIS — N186 End stage renal disease: Secondary | ICD-10-CM | POA: Diagnosis not present

## 2019-11-06 ENCOUNTER — Ambulatory Visit (INDEPENDENT_AMBULATORY_CARE_PROVIDER_SITE_OTHER): Payer: Medicare HMO | Admitting: *Deleted

## 2019-11-06 ENCOUNTER — Other Ambulatory Visit: Payer: Self-pay

## 2019-11-06 DIAGNOSIS — I4891 Unspecified atrial fibrillation: Secondary | ICD-10-CM

## 2019-11-06 DIAGNOSIS — Z5181 Encounter for therapeutic drug level monitoring: Secondary | ICD-10-CM

## 2019-11-06 LAB — POCT INR: INR: 1.4 — AB (ref 2.0–3.0)

## 2019-11-06 NOTE — Patient Instructions (Addendum)
Take warfarin 3 tablets tonight, take 2 1/2 tablets tomorrow night then increase dose to 2 tablets daily.  Recheck INR in 10 days in office. Dialysis on M,W,F

## 2019-11-07 DIAGNOSIS — N186 End stage renal disease: Secondary | ICD-10-CM | POA: Diagnosis not present

## 2019-11-07 DIAGNOSIS — Z992 Dependence on renal dialysis: Secondary | ICD-10-CM | POA: Diagnosis not present

## 2019-11-10 DIAGNOSIS — N186 End stage renal disease: Secondary | ICD-10-CM | POA: Diagnosis not present

## 2019-11-10 DIAGNOSIS — Z992 Dependence on renal dialysis: Secondary | ICD-10-CM | POA: Diagnosis not present

## 2019-11-12 DIAGNOSIS — N186 End stage renal disease: Secondary | ICD-10-CM | POA: Diagnosis not present

## 2019-11-12 DIAGNOSIS — Z992 Dependence on renal dialysis: Secondary | ICD-10-CM | POA: Diagnosis not present

## 2019-11-13 DIAGNOSIS — D23121 Other benign neoplasm of skin of left upper eyelid, including canthus: Secondary | ICD-10-CM | POA: Diagnosis not present

## 2019-11-13 DIAGNOSIS — L570 Actinic keratosis: Secondary | ICD-10-CM | POA: Diagnosis not present

## 2019-11-14 DIAGNOSIS — I5032 Chronic diastolic (congestive) heart failure: Secondary | ICD-10-CM | POA: Diagnosis not present

## 2019-11-14 DIAGNOSIS — N186 End stage renal disease: Secondary | ICD-10-CM | POA: Diagnosis not present

## 2019-11-14 DIAGNOSIS — I5042 Chronic combined systolic (congestive) and diastolic (congestive) heart failure: Secondary | ICD-10-CM | POA: Diagnosis not present

## 2019-11-14 DIAGNOSIS — J9601 Acute respiratory failure with hypoxia: Secondary | ICD-10-CM | POA: Diagnosis not present

## 2019-11-14 DIAGNOSIS — Z992 Dependence on renal dialysis: Secondary | ICD-10-CM | POA: Diagnosis not present

## 2019-11-17 DIAGNOSIS — Z992 Dependence on renal dialysis: Secondary | ICD-10-CM | POA: Diagnosis not present

## 2019-11-17 DIAGNOSIS — N2581 Secondary hyperparathyroidism of renal origin: Secondary | ICD-10-CM | POA: Diagnosis not present

## 2019-11-17 DIAGNOSIS — D509 Iron deficiency anemia, unspecified: Secondary | ICD-10-CM | POA: Diagnosis not present

## 2019-11-17 DIAGNOSIS — N186 End stage renal disease: Secondary | ICD-10-CM | POA: Diagnosis not present

## 2019-11-19 DIAGNOSIS — Z992 Dependence on renal dialysis: Secondary | ICD-10-CM | POA: Diagnosis not present

## 2019-11-19 DIAGNOSIS — N186 End stage renal disease: Secondary | ICD-10-CM | POA: Diagnosis not present

## 2019-11-20 DIAGNOSIS — E039 Hypothyroidism, unspecified: Secondary | ICD-10-CM | POA: Diagnosis not present

## 2019-11-20 DIAGNOSIS — E063 Autoimmune thyroiditis: Secondary | ICD-10-CM | POA: Diagnosis not present

## 2019-11-20 DIAGNOSIS — I10 Essential (primary) hypertension: Secondary | ICD-10-CM | POA: Diagnosis not present

## 2019-11-20 DIAGNOSIS — N185 Chronic kidney disease, stage 5: Secondary | ICD-10-CM | POA: Diagnosis not present

## 2019-11-20 DIAGNOSIS — Z6839 Body mass index (BMI) 39.0-39.9, adult: Secondary | ICD-10-CM | POA: Diagnosis not present

## 2019-11-21 ENCOUNTER — Other Ambulatory Visit: Payer: Self-pay | Admitting: Adult Health

## 2019-11-21 DIAGNOSIS — N186 End stage renal disease: Secondary | ICD-10-CM | POA: Diagnosis not present

## 2019-11-21 DIAGNOSIS — Z992 Dependence on renal dialysis: Secondary | ICD-10-CM | POA: Diagnosis not present

## 2019-11-24 DIAGNOSIS — Z992 Dependence on renal dialysis: Secondary | ICD-10-CM | POA: Diagnosis not present

## 2019-11-24 DIAGNOSIS — N186 End stage renal disease: Secondary | ICD-10-CM | POA: Diagnosis not present

## 2019-11-25 ENCOUNTER — Encounter: Payer: Self-pay | Admitting: Physician Assistant

## 2019-11-25 ENCOUNTER — Ambulatory Visit (INDEPENDENT_AMBULATORY_CARE_PROVIDER_SITE_OTHER): Payer: Medicare HMO | Admitting: *Deleted

## 2019-11-25 ENCOUNTER — Telehealth (INDEPENDENT_AMBULATORY_CARE_PROVIDER_SITE_OTHER): Payer: Medicare HMO | Admitting: Physician Assistant

## 2019-11-25 ENCOUNTER — Other Ambulatory Visit: Payer: Self-pay

## 2019-11-25 VITALS — Ht 64.0 in | Wt 218.0 lb

## 2019-11-25 DIAGNOSIS — Z5181 Encounter for therapeutic drug level monitoring: Secondary | ICD-10-CM | POA: Diagnosis not present

## 2019-11-25 DIAGNOSIS — I10 Essential (primary) hypertension: Secondary | ICD-10-CM | POA: Diagnosis not present

## 2019-11-25 DIAGNOSIS — I4821 Permanent atrial fibrillation: Secondary | ICD-10-CM

## 2019-11-25 DIAGNOSIS — I5042 Chronic combined systolic (congestive) and diastolic (congestive) heart failure: Secondary | ICD-10-CM | POA: Diagnosis not present

## 2019-11-25 DIAGNOSIS — I4891 Unspecified atrial fibrillation: Secondary | ICD-10-CM | POA: Diagnosis not present

## 2019-11-25 DIAGNOSIS — N186 End stage renal disease: Secondary | ICD-10-CM | POA: Diagnosis not present

## 2019-11-25 LAB — POCT INR: INR: 1.8 — AB (ref 2.0–3.0)

## 2019-11-25 NOTE — Patient Instructions (Signed)
Increase warfarin to 2 tablets daily except 3 tablets on Tuesdays and Fridays. Recheck in 3 weeks

## 2019-11-25 NOTE — Progress Notes (Signed)
Cardiology Office Note:    Date:  11/27/2019   ID:  Sabrina Beck, DOB [birth year], MRN [id number]  PCP:  Redmond School, MD  Cardiologist:  Sanda Klein, MD  Electrophysiologist:  None   Referring MD: Redmond School, MD   Chief Complaint  Patient presents with  . Follow-up    seen for Dr. Sallyanne Kuster    History of Present Illness:    Sabrina Beck is a 79 y.o. female with a hx of permanent atrial fibrillation on Coumadin, chronic combined systolic and diastolic heart failure, HTN and ESRD HD MWF. Echocardiogram obtained on 08/22/2018 showed EF 45%, hypokinesis of the mid inferoseptal myocardium, mild MR, moderate LAE. Patient was previously seen in consultation in Sept 2020 with atrial fibrillation in the setting of acute illness with pneumonia and respiratory failure.  She was treated with IV amiodarone for rate control.  She was discharged on Lopressor 25 mg twice daily.  More recently, patient was admitted in February 2021 with worsening dyspnea and generalized weakness.  EKG showed atrial fibrillation with RVR, heart rate of 131.  She was diagnosed again with pneumonia and was treated with antibiotic.  During her hospitalization, she developed hypotension after starting on IV Cardizem, this was further worsened with hemodialysis.  She was treated with IV amiodarone therapy again.  Echocardiogram obtained on 09/27/2019 showed EF 40 to 45%, left ventricle demonstrated global hypokinesis with regional variation, septal paradox and dyskinesis, mild LVH, RV systolic pressure 42.3 mmHg, severely dilated left and right atrium, mild MR.  Patient was eventually restarted on metoprolol and midodrine.  Patient presents today for cardiology office visit.  Blood pressure has been stable on midodrine even with dialysis.  She has no recent orthopnea or PND.  She denies any recent chest pain.  I recommended continue on the current therapy.  Past Medical History:  Diagnosis Date  . Anemia   . Atrial  fibrillation ([place])   . Chest pain 05/15/2006   Stress test negative for ischemia  . Diastolic heart failure ([place])   . Diverticulosis   . ESRD on hemodialysis ([place])   . GERD (gastroesophageal reflux disease)   . Headache   . Mixed hyperlipidemia   . Peripheral neuropathy   . PONV (postoperative nausea and vomiting)   . Systemic hypertension   . Uterine cancer [facility])     Past Surgical History:  Procedure Laterality Date  . ABDOMINAL HYSTERECTOMY    . APPENDECTOMY    . AV FISTULA PLACEMENT Left 11/17/2013   Procedure: CREATION OF LEFT RADIOCEPHALIC ARTERIOVENOUS (AV) FISTULA ;  Surgeon: [name], MD;  Location: [facility];  Service: Vascular;  Laterality: Left;  . BREAST SURGERY Left    tumors non canerous 2- 3 different surgeries  . CATARACT EXTRACTION W/PHACO Right 10/26/2016   Procedure: CATARACT EXTRACTION PHACO AND INTRAOCULAR LENS PLACEMENT (IOC) CDE= 11.91;  Surgeon: Tonny Branch, MD;  Location: [facility];  Service: Ophthalmology;  Laterality: Right;  right - pt knows to arrive at 6:30  . CATARACT EXTRACTION W/PHACO Left 11/30/2016   Procedure: CATARACT EXTRACTION PHACO AND INTRAOCULAR LENS PLACEMENT (IOC) CDE - 7.92 ;  Surgeon: Tonny Branch, MD;  Location: [facility];  Service: Ophthalmology;  Laterality: Left;  left  . CHOLECYSTECTOMY    . COLONOSCOPY W/ BIOPSIES AND POLYPECTOMY    . FISTULA SUPERFICIALIZATION Left 05/19/2014   Procedure: FISTULA SUPERFICIALIZATION-LEFT ARM;  Surgeon: [name], MD;  Location: [facility];  Service: Vascular;  Laterality: Left;  . INSERTION OF  DIALYSIS CATHETER N/A 01/24/2014   Procedure: INSERTION OF DIALYSIS CATHETER;  Surgeon: Rosetta Posner, MD;  Location: [facility];  Service: Vascular;  Laterality: N/A;  . KNEE ARTHROSCOPY    . TUBAL LIGATION      Current Medications: Current Meds  Medication Sig  . acetaminophen (TYLENOL) 500 MG tablet Take 1,000 mg by mouth every 6 (six) hours as needed for moderate pain.   Marland Kitchen albuterol (VENTOLIN HFA) 108 (90 Base)  MCG/ACT inhaler Inhale 2 puffs into the lungs every 4 (four) hours as needed for wheezing or shortness of breath (cough, shortness of breath or wheezing.).  Marland Kitchen Cholecalciferol (VITAMIN D) 2000 UNITS CAPS Take 2,000 Units by mouth every evening.   . gabapentin (NEURONTIN) 100 MG capsule Take 100 mg by mouth 2 (two) times daily.   Marland Kitchen levothyroxine (SYNTHROID) 50 MCG tablet Take 50 mcg by mouth daily.  . metoprolol tartrate (LOPRESSOR) 25 MG tablet Take 1 tablet (25 mg total) by mouth 2 (two) times daily.  . midodrine (PROAMATINE) 5 MG tablet Take 1 tablet (5 mg total) by mouth 3 (three) times daily with meals.  . Multiple Vitamins-Minerals (PRESERVISION AREDS 2 PO) Take 1 tablet by mouth 2 (two) times daily.  . multivitamin (RENA-VIT) TABS tablet Take 1 tablet by mouth every evening.   . Omega-3 Fatty Acids (OMEGA-3 FISH OIL) 300 MG CAPS Take 300 mg by mouth 2 (two) times daily.  . pantoprazole (PROTONIX) 40 MG tablet Take 1 tablet (40 mg total) by mouth daily before breakfast.  . Propylene Glycol (SYSTANE BALANCE) 0.6 % SOLN Place 1 drop into both eyes 3 (three) times daily as needed (for burning or dry eyes).   . RENVELA 800 MG tablet Take 2,400 mg by mouth 3 (three) times daily with meals. Takes 1 tablet with snacks  . warfarin (COUMADIN) 5 MG tablet TAKE 1 & 1/2 (ONE & ONE-HALF) TABLETS  TO  2  TABLETS  BY MOUTH ONCE  DAILY  AS  DIRECTED  BY  COUMADIN  CLINIC     Allergies:   Patient has no known allergies.   Social History   Socioeconomic History  . Marital status: Widowed    Spouse name: Not on file  . Number of children: 2  . Years of education: Not on file  . Highest education level: High school graduate  Occupational History  . Occupation: retired Oncologist   Tobacco Use  . Smoking status: Former Smoker    Quit date: 08/08/1999    Years since quitting: 20.3  . Smokeless tobacco: Never Used  Substance and Sexual Activity  . Alcohol use: No    Alcohol/week: 0.0 standard drinks   . Drug use: No  . Sexual activity: Not Currently  Other Topics Concern  . Not on file  Social History Narrative   Lives in a one story home.  One of her sons lives with her.  Has 2 sons.  Retired Oncologist.  Eduction: high school.   Social Determinants of Health   Financial Resource Strain:   . Difficulty of Paying Living Expenses:   Food Insecurity:   . Worried About Charity fundraiser in the Last Year:   . Arboriculturist in the Last Year:   Transportation Needs: No Transportation Needs  . Lack of Transportation (Medical): No  . Lack of Transportation (Non-Medical): No  Physical Activity:   . Days of Exercise per Week:   . Minutes of Exercise per Session:  Stress:   . Feeling of Stress :   Social Connections: Unknown  . Frequency of Communication with Friends and Family: Not on file  . Frequency of Social Gatherings with Friends and Family: Not on file  . Attends Religious Services: Not on file  . Active Member of Clubs or Organizations: Not on file  . Attends Archivist Meetings: Not on file  . Marital Status: Widowed     Family History: The patient's family history includes Cancer in her brother; Diabetes in her brother, father, mother, and sister; Heart attack in her brother and father; Heart disease in her brother and father; Hyperlipidemia in her sister; Hypertension in her brother, mother, and sister. There is no history of Colon cancer.  ROS:   Please see the history of present illness.     All other systems reviewed and are negative.  EKGs/Labs/Other Studies Reviewed:    The following studies were reviewed today:  Echo 09/27/2019 1. Left ventricular ejection fraction, by estimation, is 40 to 45%. The  left ventricle has mildly decreased function. The left ventricle  demonstrates global hypokinesis with regional variation, septal  paradox/dyskinesis. The left ventricular internal  cavity size was mildly to moderately dilated. There is mild  left  ventricular hypertrophy. Left ventricular diastolic parameters are  indeterminate in the setting of atrial fibrillation.  2. Right ventricular systolic function is normal. The right ventricular  size is normal. There is moderately elevated pulmonary artery systolic  pressure. The estimated right ventricular systolic pressure is 84.1 mmHg.  3. Left atrial size was severely dilated.  4. Right atrial size was severely dilated.  5. The mitral valve is degenerative, mildly thickened and with mild to  moderate annular calcification. Moderate mitral valve regurgitation.  6. Tricuspid valve regurgitation is moderate.  7. The aortic valve is tricuspid. Aortic valve regurgitation is not  visualized. Mild to moderate aortic valve sclerosis/calcification is  present, versus very mild aortic stenosis.  8. The inferior vena cava is dilated in size with <50% respiratory  variability, suggesting right atrial pressure of 15 mmHg.   EKG:  EKG is not ordered today.    Recent Labs: 04/08/2019: B Natriuretic Peptide 344.0 09/22/2019: TSH 4.301 09/23/2019: ALT 21; Magnesium 1.9 09/29/2019: BUN 77; Creatinine, Ser 10.95; Hemoglobin 10.1; Platelets 182; Potassium 4.4; Sodium 136  Recent Lipid Panel    Component Value Date/Time   CHOL 87 (L) 06/08/2015 0735   TRIG 272 (H) 06/08/2015 0735   HDL 25 (L) 06/08/2015 0735   LDLCALC 8 06/08/2015 0735    Physical Exam:    VS:  Ht 5\' 4"  (1.626 m)   Wt 218 lb (98.9 kg)   BMI 37.42 kg/m     Wt Readings from Last 3 Encounters:  11/25/19 218 lb (98.9 kg)  09/29/19 219 lb 9.3 oz (99.6 kg)  06/05/19 220 lb (99.8 kg)     GEN:  Well nourished, well developed in no acute distress HEENT: Normal NECK: No JVD; No carotid bruits LYMPHATICS: No lymphadenopathy CARDIAC: RRR, no murmurs, rubs, gallops RESPIRATORY:  Clear to auscultation without rales, wheezing or rhonchi  ABDOMEN: Soft, non-tender, non-distended MUSCULOSKELETAL:  No edema; No deformity    SKIN: Warm and dry NEUROLOGIC:  Alert and oriented x 3 PSYCHIATRIC:  Normal affect   ASSESSMENT:    1. Permanent atrial fibrillation ([place])   2. Chronic combined systolic and diastolic CHF, NYHA class 2 ([place])   3. Essential hypertension   4. ESRD (end stage renal disease) ([place])  PLAN:    In order of problems listed above:  1. Permanent atrial fibrillation: Rate controlled on metoprolol, continue Coumadin  2. Chronic combined systolic and diastolic heart failure: Euvolemic.  Volume controlled via dialysis  3. Hypertension: Patient was actually hypotensive recently and was started on midodrine.  Blood pressure well controlled on current therapy  4. End-stage renal disease on dialysis: Managed by nephrology service.   Medication Adjustments/Labs and Tests Ordered: Current medicines are reviewed at length with the patient today.  Concerns regarding medicines are outlined above.  No orders of the defined types were placed in this encounter.  No orders of the defined types were placed in this encounter.   Patient Instructions  Medication Instructions:  Your physician recommends that you continue on your current medications as directed. Please refer to the Current Medication list given to you today.  *If you need a refill on your cardiac medications before your next appointment, please call your pharmacy*   Lab Work: NONE ordered at this time of appointment   If you have labs (blood work) drawn today and your tests are completely normal, you will receive your results only by: Marland Kitchen MyChart Message (if you have MyChart) OR . A paper copy in the mail If you have any lab test that is abnormal or we need to change your treatment, we will call you to review the results.  Testing/Procedures: NONE ordered at this time of appointment   Follow-Up: At [facility], you and your health needs are our priority.  As part of our continuing mission to provide you with exceptional heart  care, we have created designated Provider Care Teams.  These Care Teams include your primary Cardiologist (physician) and Advanced Practice Providers (APPs -  Physician Assistants and Nurse Practitioners) who all work together to provide you with the care you need, when you need it.  We recommend signing up for the patient portal called "MyChart".  Sign up information is provided on this After Visit Summary.  MyChart is used to connect with patients for Virtual Visits (Telemedicine).  Patients are able to view lab/test results, encounter notes, upcoming appointments, etc.  Non-urgent messages can be sent to your provider as well.   To learn more about what you can do with MyChart, go to [email or web address].    Your next appointment:   6 month(s)  The format for your next appointment:   In Person  Provider:   Sanda Klein, MD  Other Instructions      Signed, Almyra Deforest, [place]  11/27/2019 11:38 PM    [place]

## 2019-11-25 NOTE — Patient Instructions (Signed)
Medication Instructions:  Your physician recommends that you continue on your current medications as directed. Please refer to the Current Medication list given to you today.  *If you need a refill on your cardiac medications before your next appointment, please call your pharmacy*  Lab Work: NONE ordered at this time of appointment   If you have labs (blood work) drawn today and your tests are completely normal, you will receive your results only by: . MyChart Message (if you have MyChart) OR . A paper copy in the mail If you have any lab test that is abnormal or we need to change your treatment, we will call you to review the results.  Testing/Procedures: NONE ordered at this time of appointment   Follow-Up: At [facility], you and your health needs are our priority.  As part of our continuing mission to provide you with exceptional heart care, we have created designated Provider Care Teams.  These Care Teams include your primary Cardiologist (physician) and Advanced Practice Providers (APPs -  Physician Assistants and Nurse Practitioners) who all work together to provide you with the care you need, when you need it.  We recommend signing up for the patient portal called "MyChart".  Sign up information is provided on this After Visit Summary.  MyChart is used to connect with patients for Virtual Visits (Telemedicine).  Patients are able to view lab/test results, encounter notes, upcoming appointments, etc.  Non-urgent messages can be sent to your provider as well.   To learn more about what you can do with MyChart, go to [email or web address].    Your next appointment:   6 month(s)  The format for your next appointment:   In Person  Provider:   Mihai Croitoru, MD  Other Instructions   

## 2019-11-26 DIAGNOSIS — Z992 Dependence on renal dialysis: Secondary | ICD-10-CM | POA: Diagnosis not present

## 2019-11-26 DIAGNOSIS — N186 End stage renal disease: Secondary | ICD-10-CM | POA: Diagnosis not present

## 2019-11-27 ENCOUNTER — Encounter: Payer: Self-pay | Admitting: Physician Assistant

## 2019-11-28 DIAGNOSIS — Z992 Dependence on renal dialysis: Secondary | ICD-10-CM | POA: Diagnosis not present

## 2019-11-28 DIAGNOSIS — N186 End stage renal disease: Secondary | ICD-10-CM | POA: Diagnosis not present

## 2019-12-01 DIAGNOSIS — Z992 Dependence on renal dialysis: Secondary | ICD-10-CM | POA: Diagnosis not present

## 2019-12-01 DIAGNOSIS — N186 End stage renal disease: Secondary | ICD-10-CM | POA: Diagnosis not present

## 2019-12-01 DIAGNOSIS — N2581 Secondary hyperparathyroidism of renal origin: Secondary | ICD-10-CM | POA: Diagnosis not present

## 2019-12-03 DIAGNOSIS — N186 End stage renal disease: Secondary | ICD-10-CM | POA: Diagnosis not present

## 2019-12-03 DIAGNOSIS — Z992 Dependence on renal dialysis: Secondary | ICD-10-CM | POA: Diagnosis not present

## 2019-12-05 DIAGNOSIS — N186 End stage renal disease: Secondary | ICD-10-CM | POA: Diagnosis not present

## 2019-12-05 DIAGNOSIS — Z992 Dependence on renal dialysis: Secondary | ICD-10-CM | POA: Diagnosis not present

## 2019-12-08 DIAGNOSIS — N186 End stage renal disease: Secondary | ICD-10-CM | POA: Diagnosis not present

## 2019-12-08 DIAGNOSIS — Z992 Dependence on renal dialysis: Secondary | ICD-10-CM | POA: Diagnosis not present

## 2019-12-10 DIAGNOSIS — Z992 Dependence on renal dialysis: Secondary | ICD-10-CM | POA: Diagnosis not present

## 2019-12-10 DIAGNOSIS — N186 End stage renal disease: Secondary | ICD-10-CM | POA: Diagnosis not present

## 2019-12-12 DIAGNOSIS — Z992 Dependence on renal dialysis: Secondary | ICD-10-CM | POA: Diagnosis not present

## 2019-12-12 DIAGNOSIS — N186 End stage renal disease: Secondary | ICD-10-CM | POA: Diagnosis not present

## 2019-12-14 DIAGNOSIS — I5032 Chronic diastolic (congestive) heart failure: Secondary | ICD-10-CM | POA: Diagnosis not present

## 2019-12-14 DIAGNOSIS — I5042 Chronic combined systolic (congestive) and diastolic (congestive) heart failure: Secondary | ICD-10-CM | POA: Diagnosis not present

## 2019-12-14 DIAGNOSIS — J9601 Acute respiratory failure with hypoxia: Secondary | ICD-10-CM | POA: Diagnosis not present

## 2019-12-15 ENCOUNTER — Other Ambulatory Visit: Payer: Self-pay

## 2019-12-15 ENCOUNTER — Ambulatory Visit (INDEPENDENT_AMBULATORY_CARE_PROVIDER_SITE_OTHER): Payer: Medicare HMO | Admitting: *Deleted

## 2019-12-15 DIAGNOSIS — I4891 Unspecified atrial fibrillation: Secondary | ICD-10-CM

## 2019-12-15 DIAGNOSIS — Z992 Dependence on renal dialysis: Secondary | ICD-10-CM | POA: Diagnosis not present

## 2019-12-15 DIAGNOSIS — N186 End stage renal disease: Secondary | ICD-10-CM | POA: Diagnosis not present

## 2019-12-15 DIAGNOSIS — Z5181 Encounter for therapeutic drug level monitoring: Secondary | ICD-10-CM

## 2019-12-15 LAB — POCT INR: INR: 4 — AB (ref 2.0–3.0)

## 2019-12-15 NOTE — Patient Instructions (Signed)
Hold warfarin tonight then decrease dose to 2 tablets daily except 3 tablets on Fridays. Recheck in 3 weeks

## 2019-12-17 DIAGNOSIS — Z992 Dependence on renal dialysis: Secondary | ICD-10-CM | POA: Diagnosis not present

## 2019-12-17 DIAGNOSIS — N186 End stage renal disease: Secondary | ICD-10-CM | POA: Diagnosis not present

## 2019-12-19 DIAGNOSIS — Z992 Dependence on renal dialysis: Secondary | ICD-10-CM | POA: Diagnosis not present

## 2019-12-19 DIAGNOSIS — N186 End stage renal disease: Secondary | ICD-10-CM | POA: Diagnosis not present

## 2019-12-22 DIAGNOSIS — D509 Iron deficiency anemia, unspecified: Secondary | ICD-10-CM | POA: Diagnosis not present

## 2019-12-22 DIAGNOSIS — N186 End stage renal disease: Secondary | ICD-10-CM | POA: Diagnosis not present

## 2019-12-22 DIAGNOSIS — Z992 Dependence on renal dialysis: Secondary | ICD-10-CM | POA: Diagnosis not present

## 2019-12-22 DIAGNOSIS — N2581 Secondary hyperparathyroidism of renal origin: Secondary | ICD-10-CM | POA: Diagnosis not present

## 2019-12-24 DIAGNOSIS — N186 End stage renal disease: Secondary | ICD-10-CM | POA: Diagnosis not present

## 2019-12-24 DIAGNOSIS — Z992 Dependence on renal dialysis: Secondary | ICD-10-CM | POA: Diagnosis not present

## 2019-12-26 DIAGNOSIS — N186 End stage renal disease: Secondary | ICD-10-CM | POA: Diagnosis not present

## 2019-12-26 DIAGNOSIS — Z992 Dependence on renal dialysis: Secondary | ICD-10-CM | POA: Diagnosis not present

## 2019-12-29 DIAGNOSIS — N186 End stage renal disease: Secondary | ICD-10-CM | POA: Diagnosis not present

## 2019-12-29 DIAGNOSIS — Z992 Dependence on renal dialysis: Secondary | ICD-10-CM | POA: Diagnosis not present

## 2019-12-31 DIAGNOSIS — N186 End stage renal disease: Secondary | ICD-10-CM | POA: Diagnosis not present

## 2019-12-31 DIAGNOSIS — Z6839 Body mass index (BMI) 39.0-39.9, adult: Secondary | ICD-10-CM | POA: Diagnosis not present

## 2019-12-31 DIAGNOSIS — E063 Autoimmune thyroiditis: Secondary | ICD-10-CM | POA: Diagnosis not present

## 2019-12-31 DIAGNOSIS — I10 Essential (primary) hypertension: Secondary | ICD-10-CM | POA: Diagnosis not present

## 2019-12-31 DIAGNOSIS — M47812 Spondylosis without myelopathy or radiculopathy, cervical region: Secondary | ICD-10-CM | POA: Diagnosis not present

## 2019-12-31 DIAGNOSIS — Z992 Dependence on renal dialysis: Secondary | ICD-10-CM | POA: Diagnosis not present

## 2019-12-31 DIAGNOSIS — E039 Hypothyroidism, unspecified: Secondary | ICD-10-CM | POA: Diagnosis not present

## 2020-01-02 DIAGNOSIS — M5412 Radiculopathy, cervical region: Secondary | ICD-10-CM | POA: Diagnosis not present

## 2020-01-02 DIAGNOSIS — M25811 Other specified joint disorders, right shoulder: Secondary | ICD-10-CM | POA: Diagnosis not present

## 2020-01-02 DIAGNOSIS — Z992 Dependence on renal dialysis: Secondary | ICD-10-CM | POA: Diagnosis not present

## 2020-01-02 DIAGNOSIS — E063 Autoimmune thyroiditis: Secondary | ICD-10-CM | POA: Diagnosis not present

## 2020-01-02 DIAGNOSIS — K219 Gastro-esophageal reflux disease without esophagitis: Secondary | ICD-10-CM | POA: Diagnosis not present

## 2020-01-02 DIAGNOSIS — N186 End stage renal disease: Secondary | ICD-10-CM | POA: Diagnosis not present

## 2020-01-02 DIAGNOSIS — I4891 Unspecified atrial fibrillation: Secondary | ICD-10-CM | POA: Diagnosis not present

## 2020-01-02 DIAGNOSIS — N185 Chronic kidney disease, stage 5: Secondary | ICD-10-CM | POA: Diagnosis not present

## 2020-01-02 DIAGNOSIS — M542 Cervicalgia: Secondary | ICD-10-CM | POA: Diagnosis not present

## 2020-01-02 DIAGNOSIS — M47812 Spondylosis without myelopathy or radiculopathy, cervical region: Secondary | ICD-10-CM | POA: Diagnosis not present

## 2020-01-02 DIAGNOSIS — Z6839 Body mass index (BMI) 39.0-39.9, adult: Secondary | ICD-10-CM | POA: Diagnosis not present

## 2020-01-05 DIAGNOSIS — N186 End stage renal disease: Secondary | ICD-10-CM | POA: Diagnosis not present

## 2020-01-05 DIAGNOSIS — Z992 Dependence on renal dialysis: Secondary | ICD-10-CM | POA: Diagnosis not present

## 2020-01-07 ENCOUNTER — Other Ambulatory Visit: Payer: Self-pay

## 2020-01-07 ENCOUNTER — Ambulatory Visit (INDEPENDENT_AMBULATORY_CARE_PROVIDER_SITE_OTHER): Payer: Medicare HMO | Admitting: *Deleted

## 2020-01-07 DIAGNOSIS — N186 End stage renal disease: Secondary | ICD-10-CM | POA: Diagnosis not present

## 2020-01-07 DIAGNOSIS — I4891 Unspecified atrial fibrillation: Secondary | ICD-10-CM | POA: Diagnosis not present

## 2020-01-07 DIAGNOSIS — Z5181 Encounter for therapeutic drug level monitoring: Secondary | ICD-10-CM | POA: Diagnosis not present

## 2020-01-07 DIAGNOSIS — Z992 Dependence on renal dialysis: Secondary | ICD-10-CM | POA: Diagnosis not present

## 2020-01-07 DIAGNOSIS — N2581 Secondary hyperparathyroidism of renal origin: Secondary | ICD-10-CM | POA: Diagnosis not present

## 2020-01-07 LAB — POCT INR: INR: 3.7 — AB (ref 2.0–3.0)

## 2020-01-07 NOTE — Patient Instructions (Signed)
Hold warfarin tonight then decrease dose to 2 tablets daily except 1 tablet on Fridays. Recheck in 4 weeks

## 2020-01-09 DIAGNOSIS — N186 End stage renal disease: Secondary | ICD-10-CM | POA: Diagnosis not present

## 2020-01-09 DIAGNOSIS — Z992 Dependence on renal dialysis: Secondary | ICD-10-CM | POA: Diagnosis not present

## 2020-01-12 DIAGNOSIS — L851 Acquired keratosis [keratoderma] palmaris et plantaris: Secondary | ICD-10-CM | POA: Diagnosis not present

## 2020-01-12 DIAGNOSIS — L600 Ingrowing nail: Secondary | ICD-10-CM | POA: Diagnosis not present

## 2020-01-12 DIAGNOSIS — I739 Peripheral vascular disease, unspecified: Secondary | ICD-10-CM | POA: Diagnosis not present

## 2020-01-12 DIAGNOSIS — Z992 Dependence on renal dialysis: Secondary | ICD-10-CM | POA: Diagnosis not present

## 2020-01-12 DIAGNOSIS — N186 End stage renal disease: Secondary | ICD-10-CM | POA: Diagnosis not present

## 2020-01-12 DIAGNOSIS — B351 Tinea unguium: Secondary | ICD-10-CM | POA: Diagnosis not present

## 2020-01-14 DIAGNOSIS — I5032 Chronic diastolic (congestive) heart failure: Secondary | ICD-10-CM | POA: Diagnosis not present

## 2020-01-14 DIAGNOSIS — N186 End stage renal disease: Secondary | ICD-10-CM | POA: Diagnosis not present

## 2020-01-14 DIAGNOSIS — Z992 Dependence on renal dialysis: Secondary | ICD-10-CM | POA: Diagnosis not present

## 2020-01-14 DIAGNOSIS — J9601 Acute respiratory failure with hypoxia: Secondary | ICD-10-CM | POA: Diagnosis not present

## 2020-01-14 DIAGNOSIS — I5042 Chronic combined systolic (congestive) and diastolic (congestive) heart failure: Secondary | ICD-10-CM | POA: Diagnosis not present

## 2020-01-16 DIAGNOSIS — N186 End stage renal disease: Secondary | ICD-10-CM | POA: Diagnosis not present

## 2020-01-16 DIAGNOSIS — Z992 Dependence on renal dialysis: Secondary | ICD-10-CM | POA: Diagnosis not present

## 2020-01-19 ENCOUNTER — Inpatient Hospital Stay (HOSPITAL_COMMUNITY): Payer: Medicare HMO

## 2020-01-19 ENCOUNTER — Inpatient Hospital Stay (HOSPITAL_COMMUNITY)
Admission: EM | Admit: 2020-01-19 | Discharge: 2020-03-07 | DRG: 870 | Disposition: E | Payer: Medicare HMO | Attending: Internal Medicine | Admitting: Internal Medicine

## 2020-01-19 ENCOUNTER — Emergency Department (HOSPITAL_COMMUNITY): Payer: Medicare HMO

## 2020-01-19 DIAGNOSIS — I482 Chronic atrial fibrillation, unspecified: Secondary | ICD-10-CM | POA: Diagnosis present

## 2020-01-19 DIAGNOSIS — Z7189 Other specified counseling: Secondary | ICD-10-CM | POA: Diagnosis not present

## 2020-01-19 DIAGNOSIS — D689 Coagulation defect, unspecified: Secondary | ICD-10-CM

## 2020-01-19 DIAGNOSIS — Z8542 Personal history of malignant neoplasm of other parts of uterus: Secondary | ICD-10-CM

## 2020-01-19 DIAGNOSIS — Z978 Presence of other specified devices: Secondary | ICD-10-CM

## 2020-01-19 DIAGNOSIS — Z515 Encounter for palliative care: Secondary | ICD-10-CM | POA: Diagnosis not present

## 2020-01-19 DIAGNOSIS — Z87891 Personal history of nicotine dependence: Secondary | ICD-10-CM

## 2020-01-19 DIAGNOSIS — Z83438 Family history of other disorder of lipoprotein metabolism and other lipidemia: Secondary | ICD-10-CM

## 2020-01-19 DIAGNOSIS — E782 Mixed hyperlipidemia: Secondary | ICD-10-CM | POA: Diagnosis present

## 2020-01-19 DIAGNOSIS — E875 Hyperkalemia: Secondary | ICD-10-CM | POA: Diagnosis present

## 2020-01-19 DIAGNOSIS — J189 Pneumonia, unspecified organism: Secondary | ICD-10-CM | POA: Diagnosis not present

## 2020-01-19 DIAGNOSIS — R34 Anuria and oliguria: Secondary | ICD-10-CM | POA: Diagnosis not present

## 2020-01-19 DIAGNOSIS — R918 Other nonspecific abnormal finding of lung field: Secondary | ICD-10-CM

## 2020-01-19 DIAGNOSIS — I468 Cardiac arrest due to other underlying condition: Secondary | ICD-10-CM | POA: Diagnosis present

## 2020-01-19 DIAGNOSIS — E039 Hypothyroidism, unspecified: Secondary | ICD-10-CM | POA: Diagnosis present

## 2020-01-19 DIAGNOSIS — N186 End stage renal disease: Secondary | ICD-10-CM | POA: Diagnosis not present

## 2020-01-19 DIAGNOSIS — Z79899 Other long term (current) drug therapy: Secondary | ICD-10-CM

## 2020-01-19 DIAGNOSIS — J9811 Atelectasis: Secondary | ICD-10-CM | POA: Diagnosis not present

## 2020-01-19 DIAGNOSIS — Z20822 Contact with and (suspected) exposure to covid-19: Secondary | ICD-10-CM | POA: Diagnosis not present

## 2020-01-19 DIAGNOSIS — Z809 Family history of malignant neoplasm, unspecified: Secondary | ICD-10-CM

## 2020-01-19 DIAGNOSIS — R092 Respiratory arrest: Secondary | ICD-10-CM

## 2020-01-19 DIAGNOSIS — N250 Renal osteodystrophy: Secondary | ICD-10-CM | POA: Diagnosis not present

## 2020-01-19 DIAGNOSIS — Z4682 Encounter for fitting and adjustment of non-vascular catheter: Secondary | ICD-10-CM | POA: Diagnosis not present

## 2020-01-19 DIAGNOSIS — E872 Acidosis: Secondary | ICD-10-CM | POA: Diagnosis present

## 2020-01-19 DIAGNOSIS — E1140 Type 2 diabetes mellitus with diabetic neuropathy, unspecified: Secondary | ICD-10-CM | POA: Diagnosis present

## 2020-01-19 DIAGNOSIS — I5042 Chronic combined systolic (congestive) and diastolic (congestive) heart failure: Secondary | ICD-10-CM | POA: Diagnosis present

## 2020-01-19 DIAGNOSIS — J9602 Acute respiratory failure with hypercapnia: Secondary | ICD-10-CM | POA: Diagnosis not present

## 2020-01-19 DIAGNOSIS — E2740 Unspecified adrenocortical insufficiency: Secondary | ICD-10-CM | POA: Diagnosis not present

## 2020-01-19 DIAGNOSIS — Z7901 Long term (current) use of anticoagulants: Secondary | ICD-10-CM

## 2020-01-19 DIAGNOSIS — E876 Hypokalemia: Secondary | ICD-10-CM | POA: Diagnosis not present

## 2020-01-19 DIAGNOSIS — Z992 Dependence on renal dialysis: Secondary | ICD-10-CM

## 2020-01-19 DIAGNOSIS — A419 Sepsis, unspecified organism: Principal | ICD-10-CM

## 2020-01-19 DIAGNOSIS — J90 Pleural effusion, not elsewhere classified: Secondary | ICD-10-CM | POA: Diagnosis not present

## 2020-01-19 DIAGNOSIS — I132 Hypertensive heart and chronic kidney disease with heart failure and with stage 5 chronic kidney disease, or end stage renal disease: Secondary | ICD-10-CM | POA: Diagnosis present

## 2020-01-19 DIAGNOSIS — R6521 Severe sepsis with septic shock: Secondary | ICD-10-CM

## 2020-01-19 DIAGNOSIS — J384 Edema of larynx: Secondary | ICD-10-CM | POA: Diagnosis not present

## 2020-01-19 DIAGNOSIS — R0902 Hypoxemia: Secondary | ICD-10-CM | POA: Diagnosis not present

## 2020-01-19 DIAGNOSIS — S301XXA Contusion of abdominal wall, initial encounter: Secondary | ICD-10-CM | POA: Diagnosis not present

## 2020-01-19 DIAGNOSIS — E1165 Type 2 diabetes mellitus with hyperglycemia: Secondary | ICD-10-CM | POA: Diagnosis present

## 2020-01-19 DIAGNOSIS — R791 Abnormal coagulation profile: Secondary | ICD-10-CM | POA: Diagnosis present

## 2020-01-19 DIAGNOSIS — I509 Heart failure, unspecified: Secondary | ICD-10-CM | POA: Diagnosis not present

## 2020-01-19 DIAGNOSIS — J939 Pneumothorax, unspecified: Secondary | ICD-10-CM | POA: Diagnosis not present

## 2020-01-19 DIAGNOSIS — E1122 Type 2 diabetes mellitus with diabetic chronic kidney disease: Secondary | ICD-10-CM | POA: Diagnosis present

## 2020-01-19 DIAGNOSIS — J156 Pneumonia due to other aerobic Gram-negative bacteria: Secondary | ICD-10-CM | POA: Diagnosis not present

## 2020-01-19 DIAGNOSIS — D539 Nutritional anemia, unspecified: Secondary | ICD-10-CM | POA: Diagnosis present

## 2020-01-19 DIAGNOSIS — R404 Transient alteration of awareness: Secondary | ICD-10-CM | POA: Diagnosis not present

## 2020-01-19 DIAGNOSIS — J181 Lobar pneumonia, unspecified organism: Secondary | ICD-10-CM | POA: Diagnosis not present

## 2020-01-19 DIAGNOSIS — Z8249 Family history of ischemic heart disease and other diseases of the circulatory system: Secondary | ICD-10-CM

## 2020-01-19 DIAGNOSIS — Z66 Do not resuscitate: Secondary | ICD-10-CM | POA: Diagnosis not present

## 2020-01-19 DIAGNOSIS — R579 Shock, unspecified: Secondary | ICD-10-CM | POA: Diagnosis not present

## 2020-01-19 DIAGNOSIS — I7101 Dissection of thoracic aorta: Secondary | ICD-10-CM | POA: Diagnosis present

## 2020-01-19 DIAGNOSIS — Z09 Encounter for follow-up examination after completed treatment for conditions other than malignant neoplasm: Secondary | ICD-10-CM

## 2020-01-19 DIAGNOSIS — D631 Anemia in chronic kidney disease: Secondary | ICD-10-CM | POA: Diagnosis present

## 2020-01-19 DIAGNOSIS — Z833 Family history of diabetes mellitus: Secondary | ICD-10-CM

## 2020-01-19 DIAGNOSIS — Z9911 Dependence on respirator [ventilator] status: Secondary | ICD-10-CM | POA: Diagnosis not present

## 2020-01-19 DIAGNOSIS — K219 Gastro-esophageal reflux disease without esophagitis: Secondary | ICD-10-CM | POA: Diagnosis present

## 2020-01-19 DIAGNOSIS — R0603 Acute respiratory distress: Secondary | ICD-10-CM | POA: Diagnosis not present

## 2020-01-19 DIAGNOSIS — Y95 Nosocomial condition: Secondary | ICD-10-CM | POA: Diagnosis present

## 2020-01-19 DIAGNOSIS — I953 Hypotension of hemodialysis: Secondary | ICD-10-CM | POA: Diagnosis not present

## 2020-01-19 DIAGNOSIS — M810 Age-related osteoporosis without current pathological fracture: Secondary | ICD-10-CM | POA: Diagnosis present

## 2020-01-19 DIAGNOSIS — I469 Cardiac arrest, cause unspecified: Secondary | ICD-10-CM | POA: Diagnosis not present

## 2020-01-19 DIAGNOSIS — I517 Cardiomegaly: Secondary | ICD-10-CM | POA: Diagnosis not present

## 2020-01-19 DIAGNOSIS — R739 Hyperglycemia, unspecified: Secondary | ICD-10-CM | POA: Diagnosis not present

## 2020-01-19 DIAGNOSIS — D6959 Other secondary thrombocytopenia: Secondary | ICD-10-CM | POA: Diagnosis present

## 2020-01-19 DIAGNOSIS — I959 Hypotension, unspecified: Secondary | ICD-10-CM

## 2020-01-19 DIAGNOSIS — J9601 Acute respiratory failure with hypoxia: Secondary | ICD-10-CM | POA: Diagnosis not present

## 2020-01-19 DIAGNOSIS — I7102 Dissection of abdominal aorta: Secondary | ICD-10-CM | POA: Diagnosis not present

## 2020-01-19 DIAGNOSIS — R52 Pain, unspecified: Secondary | ICD-10-CM | POA: Diagnosis not present

## 2020-01-19 DIAGNOSIS — J811 Chronic pulmonary edema: Secondary | ICD-10-CM | POA: Diagnosis not present

## 2020-01-19 DIAGNOSIS — Z0189 Encounter for other specified special examinations: Secondary | ICD-10-CM

## 2020-01-19 DIAGNOSIS — I5032 Chronic diastolic (congestive) heart failure: Secondary | ICD-10-CM | POA: Diagnosis not present

## 2020-01-19 DIAGNOSIS — Z01818 Encounter for other preprocedural examination: Secondary | ICD-10-CM

## 2020-01-19 DIAGNOSIS — Z452 Encounter for adjustment and management of vascular access device: Secondary | ICD-10-CM

## 2020-01-19 DIAGNOSIS — S2220XA Unspecified fracture of sternum, initial encounter for closed fracture: Secondary | ICD-10-CM | POA: Diagnosis not present

## 2020-01-19 DIAGNOSIS — Z90710 Acquired absence of both cervix and uterus: Secondary | ICD-10-CM

## 2020-01-19 DIAGNOSIS — J984 Other disorders of lung: Secondary | ICD-10-CM | POA: Diagnosis not present

## 2020-01-19 DIAGNOSIS — S2241XA Multiple fractures of ribs, right side, initial encounter for closed fracture: Secondary | ICD-10-CM | POA: Diagnosis not present

## 2020-01-19 DIAGNOSIS — I4891 Unspecified atrial fibrillation: Secondary | ICD-10-CM | POA: Diagnosis not present

## 2020-01-19 LAB — BLOOD GAS, VENOUS
Acid-base deficit: 6.5 mmol/L — ABNORMAL HIGH (ref 0.0–2.0)
Bicarbonate: 17.2 mmol/L — ABNORMAL LOW (ref 20.0–28.0)
FIO2: 97
O2 Saturation: 65.6 %
Patient temperature: 36
pCO2, Ven: 65.7 mmHg — ABNORMAL HIGH (ref 44.0–60.0)
pH, Ven: 7.137 — CL (ref 7.250–7.430)
pO2, Ven: 50.2 mmHg — ABNORMAL HIGH (ref 32.0–45.0)

## 2020-01-19 LAB — I-STAT ARTERIAL BLOOD GAS, ED
Acid-base deficit: 4 mmol/L — ABNORMAL HIGH (ref 0.0–2.0)
Bicarbonate: 21.3 mmol/L (ref 20.0–28.0)
Calcium, Ion: 1.39 mmol/L (ref 1.15–1.40)
HCT: 22 % — ABNORMAL LOW (ref 36.0–46.0)
Hemoglobin: 7.5 g/dL — ABNORMAL LOW (ref 12.0–15.0)
O2 Saturation: 95 %
Patient temperature: 99.2
Potassium: 4.4 mmol/L (ref 3.5–5.1)
Sodium: 141 mmol/L (ref 135–145)
TCO2: 22 mmol/L (ref 22–32)
pCO2 arterial: 40.8 mmHg (ref 32.0–48.0)
pH, Arterial: 7.326 — ABNORMAL LOW (ref 7.350–7.450)
pO2, Arterial: 83 mmHg (ref 83.0–108.0)

## 2020-01-19 LAB — CBC WITH DIFFERENTIAL/PLATELET
Abs Immature Granulocytes: 0.16 10*3/uL — ABNORMAL HIGH (ref 0.00–0.07)
Basophils Absolute: 0.1 10*3/uL (ref 0.0–0.1)
Basophils Relative: 0 %
Eosinophils Absolute: 0.2 10*3/uL (ref 0.0–0.5)
Eosinophils Relative: 2 %
HCT: 24.9 % — ABNORMAL LOW (ref 36.0–46.0)
Hemoglobin: 7.9 g/dL — ABNORMAL LOW (ref 12.0–15.0)
Immature Granulocytes: 1 %
Lymphocytes Relative: 33 %
Lymphs Abs: 4.1 10*3/uL — ABNORMAL HIGH (ref 0.7–4.0)
MCH: 40.3 pg — ABNORMAL HIGH (ref 26.0–34.0)
MCHC: 31.7 g/dL (ref 30.0–36.0)
MCV: 127 fL — ABNORMAL HIGH (ref 80.0–100.0)
Monocytes Absolute: 2.7 10*3/uL — ABNORMAL HIGH (ref 0.1–1.0)
Monocytes Relative: 22 %
Neutro Abs: 5.1 10*3/uL (ref 1.7–7.7)
Neutrophils Relative %: 42 %
Platelets: 215 10*3/uL (ref 150–400)
RBC: 1.96 MIL/uL — ABNORMAL LOW (ref 3.87–5.11)
RDW: 15.9 % — ABNORMAL HIGH (ref 11.5–15.5)
WBC: 12.3 10*3/uL — ABNORMAL HIGH (ref 4.0–10.5)
nRBC: 0.2 % (ref 0.0–0.2)

## 2020-01-19 LAB — COMPREHENSIVE METABOLIC PANEL
ALT: 55 U/L — ABNORMAL HIGH (ref 0–44)
AST: 55 U/L — ABNORMAL HIGH (ref 15–41)
Albumin: 2.9 g/dL — ABNORMAL LOW (ref 3.5–5.0)
Alkaline Phosphatase: 77 U/L (ref 38–126)
Anion gap: 21 — ABNORMAL HIGH (ref 5–15)
BUN: 54 mg/dL — ABNORMAL HIGH (ref 8–23)
CO2: 20 mmol/L — ABNORMAL LOW (ref 22–32)
Calcium: 10.8 mg/dL — ABNORMAL HIGH (ref 8.9–10.3)
Chloride: 96 mmol/L — ABNORMAL LOW (ref 98–111)
Creatinine, Ser: 10.02 mg/dL — ABNORMAL HIGH (ref 0.44–1.00)
GFR calc Af Amer: 4 mL/min — ABNORMAL LOW (ref >60)
GFR calc non Af Amer: 3 mL/min — ABNORMAL LOW (ref >60)
Glucose, Bld: 428 mg/dL — ABNORMAL HIGH (ref 70–99)
Potassium: 4.7 mmol/L (ref 3.5–5.1)
Sodium: 137 mmol/L (ref 135–145)
Total Bilirubin: 0.8 mg/dL (ref 0.3–1.2)
Total Protein: 5.7 g/dL — ABNORMAL LOW (ref 6.5–8.1)

## 2020-01-19 LAB — SARS CORONAVIRUS 2 BY RT PCR (HOSPITAL ORDER, PERFORMED IN ~~LOC~~ HOSPITAL LAB): SARS Coronavirus 2: NEGATIVE

## 2020-01-19 LAB — GLUCOSE, CAPILLARY
Glucose-Capillary: 146 mg/dL — ABNORMAL HIGH (ref 70–99)
Glucose-Capillary: 92 mg/dL (ref 70–99)
Glucose-Capillary: 95 mg/dL (ref 70–99)
Glucose-Capillary: 97 mg/dL (ref 70–99)
Glucose-Capillary: 90 mg/dL (ref 70–99)

## 2020-01-19 LAB — LACTIC ACID, PLASMA
Lactic Acid, Venous: 8.6 mmol/L (ref 0.5–1.9)
Lactic Acid, Venous: 5 mmol/L (ref 0.5–1.9)

## 2020-01-19 LAB — PROTIME-INR
INR: 5.1 (ref 0.8–1.2)
Prothrombin Time: 46 seconds — ABNORMAL HIGH (ref 11.4–15.2)

## 2020-01-19 LAB — LIPASE, BLOOD: Lipase: 33 U/L (ref 11–51)

## 2020-01-19 LAB — APTT: aPTT: 50 seconds — ABNORMAL HIGH (ref 24–36)

## 2020-01-19 LAB — ABO/RH: ABO/RH(D): O NEG

## 2020-01-19 LAB — TYPE AND SCREEN
ABO/RH(D): O NEG
Antibody Screen: NEGATIVE

## 2020-01-19 LAB — TROPONIN I (HIGH SENSITIVITY): Troponin I (High Sensitivity): 39 ng/L — ABNORMAL HIGH (ref <18)

## 2020-01-19 LAB — HEMOGLOBIN A1C
Hgb A1c MFr Bld: 5.3 % (ref 4.8–5.6)
Mean Plasma Glucose: 105.41 mg/dL

## 2020-01-19 LAB — PROCALCITONIN: Procalcitonin: 2.21 ng/mL

## 2020-01-19 LAB — MRSA PCR SCREENING: MRSA by PCR: NEGATIVE

## 2020-01-19 LAB — BRAIN NATRIURETIC PEPTIDE: B Natriuretic Peptide: 602 pg/mL — ABNORMAL HIGH (ref 0.0–100.0)

## 2020-01-19 MED ORDER — CHLORHEXIDINE GLUCONATE CLOTH 2 % EX PADS: 6.0000 | MEDICATED_PAD | Freq: Every day | CUTANEOUS | Status: DC

## 2020-01-19 MED ORDER — METOPROLOL TARTRATE 5 MG/5ML IV SOLN
5.0000 mg | Freq: Once | INTRAVENOUS | Status: AC
  Administered 2020-01-19: 5 mg via INTRAVENOUS
  Filled 2020-01-19: qty 5

## 2020-01-19 MED ORDER — MIDAZOLAM HCL 2 MG/2ML IJ SOLN
1.0000 mg | INTRAMUSCULAR | Status: AC | PRN
  Administered 2020-01-19 (×3): 1 mg via INTRAVENOUS
  Filled 2020-01-19 (×3): qty 2

## 2020-01-19 MED ORDER — DEXTROSE 50 % IV SOLN
INTRAVENOUS | Status: AC | PRN
  Administered 2020-01-19: 1 via INTRAVENOUS

## 2020-01-19 MED ORDER — MIDAZOLAM HCL 2 MG/2ML IJ SOLN
2.0000 mg | Freq: Once | INTRAMUSCULAR | Status: AC
  Administered 2020-01-19: 2 mg via INTRAVENOUS

## 2020-01-19 MED ORDER — DOCUSATE SODIUM 100 MG PO CAPS: 100.0000 mg | ORAL_CAPSULE | Freq: Two times a day (BID) | ORAL | Status: DC | PRN

## 2020-01-19 MED ORDER — SODIUM CHLORIDE 0.9% IV SOLUTION: Freq: Once | INTRAVENOUS | Status: AC

## 2020-01-19 MED ORDER — DOCUSATE SODIUM 50 MG/5ML PO LIQD
100.0000 mg | Freq: Two times a day (BID) | ORAL | Status: DC
  Administered 2020-01-20: 100 mg via ORAL
  Filled 2020-01-19 (×3): qty 10

## 2020-01-19 MED ORDER — CALCIUM CHLORIDE 10 % IV SOLN
INTRAVENOUS | Status: AC | PRN
  Administered 2020-01-19: 1 g via INTRAVENOUS

## 2020-01-19 MED ORDER — FENTANYL CITRATE (PF) 100 MCG/2ML IJ SOLN
25.0000 ug | INTRAMUSCULAR | Status: DC | PRN
  Administered 2020-01-22 – 2020-01-23 (×2): 25 ug via INTRAVENOUS
  Filled 2020-01-19 (×3): qty 2

## 2020-01-19 MED ORDER — CHLORHEXIDINE GLUCONATE CLOTH 2 % EX PADS
6.0000 | MEDICATED_PAD | Freq: Every day | CUTANEOUS | Status: DC
  Administered 2020-01-19 – 2020-01-24 (×7): 6 via TOPICAL

## 2020-01-19 MED ORDER — SODIUM CHLORIDE 0.9 % IV SOLN
2.0000 g | Freq: Once | INTRAVENOUS | Status: AC
  Administered 2020-01-19: 2 g via INTRAVENOUS
  Filled 2020-01-19: qty 2

## 2020-01-19 MED ORDER — POLYETHYLENE GLYCOL 3350 17 G PO PACK
17.0000 g | PACK | Freq: Every day | ORAL | Status: DC
  Administered 2020-01-20: 17 g via ORAL
  Filled 2020-01-19: qty 1

## 2020-01-19 MED ORDER — SODIUM CHLORIDE 0.9 % IV SOLN
1.0000 g | INTRAVENOUS | Status: DC
  Administered 2020-01-20: 1 g via INTRAVENOUS
  Filled 2020-01-19: qty 1

## 2020-01-19 MED ORDER — PHENYLEPHRINE CONCENTRATED 100MG/250ML (0.4 MG/ML) INFUSION SIMPLE
0.0000 ug/min | INTRAVENOUS | Status: DC
  Administered 2020-01-20: 100 ug/min via INTRAVENOUS
  Administered 2020-01-20: 70 ug/min via INTRAVENOUS
  Administered 2020-01-21: 20 ug/min via INTRAVENOUS
  Administered 2020-01-22 – 2020-01-24 (×2): 100 ug/min via INTRAVENOUS
  Administered 2020-01-25: 70 ug/min via INTRAVENOUS
  Administered 2020-01-25: 130 ug/min via INTRAVENOUS
  Administered 2020-01-26: 110 ug/min via INTRAVENOUS
  Administered 2020-01-27: 145 ug/min via INTRAVENOUS
  Administered 2020-01-27: 125 ug/min via INTRAVENOUS
  Administered 2020-01-28: 275 ug/min via INTRAVENOUS
  Administered 2020-01-28: 280 ug/min via INTRAVENOUS
  Administered 2020-01-28: 250 ug/min via INTRAVENOUS
  Administered 2020-01-29: 140 ug/min via INTRAVENOUS
  Administered 2020-01-29: 150 ug/min via INTRAVENOUS
  Administered 2020-01-30: 300 ug/min via INTRAVENOUS
  Administered 2020-01-30: 240 ug/min via INTRAVENOUS
  Administered 2020-01-30: 270 ug/min via INTRAVENOUS
  Administered 2020-01-30: 190 ug/min via INTRAVENOUS
  Administered 2020-01-31: 200 ug/min via INTRAVENOUS
  Administered 2020-01-31: 160 ug/min via INTRAVENOUS
  Administered 2020-01-31: 260 ug/min via INTRAVENOUS
  Administered 2020-02-01: 300 ug/min via INTRAVENOUS
  Administered 2020-02-01: 150 ug/min via INTRAVENOUS
  Administered 2020-02-01: 250 ug/min via INTRAVENOUS
  Administered 2020-02-02: 30 ug/min via INTRAVENOUS
  Filled 2020-01-19 (×30): qty 250

## 2020-01-19 MED ORDER — CHLORHEXIDINE GLUCONATE 0.12% ORAL RINSE (MEDLINE KIT)
15.0000 mL | Freq: Two times a day (BID) | OROMUCOSAL | Status: DC
  Administered 2020-01-19 – 2020-02-03 (×31): 15 mL via OROMUCOSAL

## 2020-01-19 MED ORDER — SODIUM BICARBONATE 8.4 % IV SOLN
INTRAVENOUS | Status: AC | PRN
  Administered 2020-01-19: 50 meq via INTRAVENOUS

## 2020-01-19 MED ORDER — PANTOPRAZOLE SODIUM 40 MG IV SOLR
40.0000 mg | Freq: Every day | INTRAVENOUS | Status: DC
  Administered 2020-01-19 – 2020-01-26 (×8): 40 mg via INTRAVENOUS
  Filled 2020-01-19 (×8): qty 40

## 2020-01-19 MED ORDER — MIDAZOLAM HCL 2 MG/2ML IJ SOLN
INTRAMUSCULAR | Status: AC
  Filled 2020-01-19: qty 2

## 2020-01-19 MED ORDER — FENTANYL CITRATE (PF) 100 MCG/2ML IJ SOLN
25.0000 ug | INTRAMUSCULAR | Status: DC | PRN
  Administered 2020-01-19 – 2020-01-23 (×12): 50 ug via INTRAVENOUS
  Administered 2020-01-23: 100 ug via INTRAVENOUS
  Administered 2020-01-23 (×2): 50 ug via INTRAVENOUS
  Administered 2020-01-23: 100 ug via INTRAVENOUS
  Administered 2020-01-23 – 2020-01-24 (×3): 50 ug via INTRAVENOUS
  Administered 2020-01-24: 100 ug via INTRAVENOUS
  Administered 2020-01-24: 50 ug via INTRAVENOUS
  Administered 2020-01-24: 100 ug via INTRAVENOUS
  Administered 2020-01-24: 50 ug via INTRAVENOUS
  Filled 2020-01-19 (×24): qty 2

## 2020-01-19 MED ORDER — PHENYLEPHRINE HCL-NACL 10-0.9 MG/250ML-% IV SOLN
0.0000 ug/min | INTRAVENOUS | Status: DC
  Administered 2020-01-19: 20 ug/min via INTRAVENOUS
  Administered 2020-01-19: 160 ug/min via INTRAVENOUS
  Administered 2020-01-19: 70 ug/min via INTRAVENOUS
  Administered 2020-01-19: 90 ug/min via INTRAVENOUS
  Administered 2020-01-19: 160 ug/min via INTRAVENOUS
  Administered 2020-01-19: 90 ug/min via INTRAVENOUS
  Administered 2020-01-19: 120 ug/min via INTRAVENOUS
  Administered 2020-01-19: 170 ug/min via INTRAVENOUS
  Filled 2020-01-19 (×4): qty 250
  Filled 2020-01-19: qty 500
  Filled 2020-01-19 (×4): qty 250

## 2020-01-19 MED ORDER — VANCOMYCIN HCL 2000 MG/400ML IV SOLN
2000.0000 mg | Freq: Once | INTRAVENOUS | Status: AC
  Administered 2020-01-19: 2000 mg via INTRAVENOUS
  Filled 2020-01-19: qty 400

## 2020-01-19 MED ORDER — POLYETHYLENE GLYCOL 3350 17 G PO PACK: 17.0000 g | PACK | Freq: Every day | ORAL | Status: DC | PRN

## 2020-01-19 MED ORDER — SODIUM ZIRCONIUM CYCLOSILICATE 5 G PO PACK
10.0000 g | PACK | Freq: Once | ORAL | Status: AC
  Administered 2020-01-19: 10 g
  Filled 2020-01-19: qty 2

## 2020-01-19 MED ORDER — MIDAZOLAM HCL 2 MG/2ML IJ SOLN
1.0000 mg | INTRAMUSCULAR | Status: DC | PRN
  Administered 2020-01-20 – 2020-01-31 (×24): 1 mg via INTRAVENOUS
  Filled 2020-01-19 (×26): qty 2

## 2020-01-19 MED ORDER — INSULIN ASPART 100 UNIT/ML IV SOLN
10.0000 [IU] | Freq: Once | INTRAVENOUS | Status: AC
  Administered 2020-01-19: 10 [IU] via INTRAVENOUS

## 2020-01-19 MED ORDER — VANCOMYCIN HCL IN DEXTROSE 1-5 GM/200ML-% IV SOLN: 1000.0000 mg | INTRAVENOUS | Status: DC

## 2020-01-19 MED ORDER — ORAL CARE MOUTH RINSE
15.0000 mL | OROMUCOSAL | Status: DC
  Administered 2020-01-19 – 2020-02-03 (×152): 15 mL via OROMUCOSAL

## 2020-01-19 MED ORDER — INSULIN ASPART 100 UNIT/ML ~~LOC~~ SOLN
0.0000 [IU] | SUBCUTANEOUS | Status: DC
  Administered 2020-01-21 – 2020-01-28 (×25): 1 [IU] via SUBCUTANEOUS
  Administered 2020-01-29: 2 [IU] via SUBCUTANEOUS
  Administered 2020-01-29: 1 [IU] via SUBCUTANEOUS
  Administered 2020-01-29 (×2): 2 [IU] via SUBCUTANEOUS
  Administered 2020-01-29 (×2): 1 [IU] via SUBCUTANEOUS
  Administered 2020-01-30: 2 [IU] via SUBCUTANEOUS
  Administered 2020-01-30 – 2020-02-05 (×23): 1 [IU] via SUBCUTANEOUS
  Administered 2020-02-05: 2 [IU] via SUBCUTANEOUS
  Administered 2020-02-06 (×3): 1 [IU] via SUBCUTANEOUS

## 2020-01-19 MED ORDER — DEXMEDETOMIDINE HCL IN NACL 400 MCG/100ML IV SOLN
0.0000 ug/kg/h | INTRAVENOUS | Status: DC
  Administered 2020-01-19 (×2): 0.4 ug/kg/h via INTRAVENOUS
  Administered 2020-01-20: 0.8 ug/kg/h via INTRAVENOUS
  Filled 2020-01-19 (×4): qty 100

## 2020-01-19 MED ORDER — PHENYLEPHRINE 40 MCG/ML (10ML) SYRINGE FOR IV PUSH (FOR BLOOD PRESSURE SUPPORT)
120.0000 ug | PREFILLED_SYRINGE | Freq: Once | INTRAVENOUS | Status: AC | PRN
  Administered 2020-01-19: 120 ug via INTRAVENOUS
  Filled 2020-01-19: qty 10

## 2020-01-19 MED ORDER — EPINEPHRINE 1 MG/10ML IJ SOSY
PREFILLED_SYRINGE | INTRAMUSCULAR | Status: AC | PRN
  Administered 2020-01-19 (×2): 1 mg via INTRAVENOUS

## 2020-01-19 MED ORDER — ATROPINE SULFATE 1 MG/ML IJ SOLN
INTRAMUSCULAR | Status: AC | PRN
  Administered 2020-01-19 (×2): 1 mg via INTRAVENOUS

## 2020-01-19 NOTE — ED Provider Notes (Signed)
[facility] EMERGENCY DEPARTMENT Provider Note   CSN: [id number] Arrival date & time: 01/26/2020  0441   Time seen 04:42 AM  History Chief Complaint  Patient presents with  . Respiratory Distress    Sabrina Beck is a 79 y.o. female.  HPI   Patient presented as respiratory distress and not breathing. When nurses went out to the car patient was not breathing but she did have a pulse. As they were putting her in on a stretcher she lost her pulse. CPR was initiated as patient was being wheeled down the hall to the ER room. CPR was continued, patient was placed on the monitor, preparations were made for intubation. When I did the first check she was in asystole.  PCP Redmond School, MD   Past Medical History:  Diagnosis Date  . Anemia   . Atrial fibrillation ([place])   . Chest pain 05/15/2006   Stress test negative for ischemia  . Diastolic heart failure ([place])   . Diverticulosis   . ESRD on hemodialysis ([place])   . GERD (gastroesophageal reflux disease)   . Headache   . Mixed hyperlipidemia   . Peripheral neuropathy   . PONV (postoperative nausea and vomiting)   . Systemic hypertension   . Uterine cancer [facility])     Patient Active Problem List   Diagnosis Date Noted  . Cardiac arrest ([place]) 01/06/2020  . Atrial fibrillation with rapid ventricular response ([place])   . Chronic systolic heart failure ([place])   . PNA (pneumonia) 09/22/2019  . Sepsis ([place]) 04/21/2019  . Coagulopathy ([place]) 04/21/2019  . Ischemic colitis ([place])   . CAP (community acquired pneumonia) 04/08/2019  . Permanent atrial fibrillation ([place]) 08/22/2018  . Acute systolic CHF (congestive heart failure) ([place]) 08/22/2018  . Community acquired pneumonia   . HCAP (healthcare-associated pneumonia) 08/18/2018  . Acute respiratory failure with hypoxia ([place]) 08/18/2018  . Pulmonary edema 08/18/2018  . Lobar pneumonia ([place]) 08/18/2018  . Dependence on renal dialysis ([place]) 10/19/2015  . Pure hypercholesterolemia  10/19/2015  . ESRD (end stage renal disease) ([place]) 05/19/2014  . 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 Abscess re-check 01/19/2014  . Breast abscess of female 01/19/2014  . Chronic diastolic CHF (congestive heart failure) ([place]) 12/25/2013  . Dyspnea 12/21/2013  . CHF (congestive heart failure) ([place]) 12/21/2013  . End stage renal disease ([place]) 11/06/2013  . CKD (chronic kidney disease) stage 5, GFR less than 15 ml/min (HCC) 10/21/2013  . HTN (hypertension) 10/21/2013  . Mixed hyperlipidemia 10/21/2013  . Encounter for therapeutic drug monitoring 09/03/2013  . Atrial fibrillation ([place]) 10/11/2012  . Long term current use of anticoagulant therapy 10/11/2012  . Anemia 03/27/2011  . Gastro-esophageal reflux disease without esophagitis 05/27/2010  . Malignant neoplasm of uterus ([place]) 05/27/2010    Past Surgical History:  Procedure Laterality Date  . ABDOMINAL HYSTERECTOMY    . APPENDECTOMY    . AV FISTULA PLACEMENT Left 11/17/2013   Procedure: CREATION OF LEFT RADIOCEPHALIC ARTERIOVENOUS (AV) FISTULA ;  Surgeon: [name], MD;  Location: [place];  Service: Vascular;  Laterality: Left;  . BREAST SURGERY Left    tumors non canerous 2- 3 different surgeries  . CATARACT EXTRACTION W/PHACO Right 10/26/2016   Procedure: CATARACT EXTRACTION PHACO AND INTRAOCULAR LENS PLACEMENT (IOC) CDE= 11.91;  Surgeon: Tonny Branch, MD;  Location: [facility];  Service: Ophthalmology;  Laterality: Right;  right - pt knows to arrive at 6:30  . CATARACT EXTRACTION W/PHACO Left  11/30/2016   Procedure: CATARACT EXTRACTION PHACO AND INTRAOCULAR LENS PLACEMENT (IOC) CDE - 7.92 ;  Surgeon: Tonny Branch, MD;  Location: [facility];  Service: Ophthalmology;  Laterality: Left;  left  . CHOLECYSTECTOMY    . COLONOSCOPY W/ BIOPSIES AND POLYPECTOMY    . FISTULA SUPERFICIALIZATION Left 05/19/2014   Procedure: FISTULA SUPERFICIALIZATION-LEFT ARM;  Surgeon: [name], MD;  Location: [place];   Service: Vascular;  Laterality: Left;  . INSERTION OF DIALYSIS CATHETER N/A 01/24/2014   Procedure: INSERTION OF DIALYSIS CATHETER;  Surgeon: Rosetta Posner, MD;  Location[facility];  Service: Vascular;  Laterality: N/A;  . KNEE ARTHROSCOPY    . TUBAL LIGATION       OB History    Gravida  3   Para  2   Term  2   Preterm      AB  1   Living        SAB  1   TAB      Ectopic      Multiple      Live Births              Family History  Problem Relation Age of Onset  . Diabetes Mother   . Hypertension Mother   . Diabetes Father   . Heart disease Father        before age 14  . Heart attack Father   . Diabetes Sister   . Hyperlipidemia Sister   . Hypertension Sister   . Cancer Brother   . Diabetes Brother   . Heart disease Brother   . Hypertension Brother   . Heart attack Brother   . Colon cancer Neg Hx     Social History   Tobacco Use  . Smoking status: Former Smoker    Quit date: 08/08/1999    Years since quitting: 20.4  . Smokeless tobacco: Never Used  Vaping Use  . Vaping Use: Never used  Substance Use Topics  . Alcohol use: No    Alcohol/week: 0.0 standard drinks  . Drug use: No    Home Medications Prior to Admission medications   Medication Sig Start Date End Date Taking? Authorizing Provider  acetaminophen (TYLENOL) 500 MG tablet Take 1,000 mg by mouth every 6 (six) hours as needed for moderate pain.     [provider]  albuterol (VENTOLIN HFA) 108 (90 Base) MCG/ACT inhaler Inhale 2 puffs into the lungs every 4 (four) hours as needed for wheezing or shortness of breath (cough, shortness of breath or wheezing.). 04/25/19   Murlean Iba, MD  Cholecalciferol (VITAMIN D) 2000 UNITS CAPS Take 2,000 Units by mouth every evening.     [provider]  fluticasone (FLOVENT HFA) 44 MCG/ACT inhaler Inhale 2 puffs into the lungs 2 (two) times daily. Patient not taking: Reported on 11/25/2019 04/25/19 04/24/20  Murlean Iba, MD    gabapentin (NEURONTIN) 100 MG capsule Take 100 mg by mouth 2 (two) times daily.  09/13/18   [provider]  guaiFENesin (MUCINEX) 600 MG 12 hr tablet Take 1 tablet (600 mg total) by mouth 2 (two) times daily. Patient not taking: Reported on 11/25/2019 09/30/19   Roxan Hockey, MD  levothyroxine (SYNTHROID) 50 MCG tablet Take 50 mcg by mouth daily. 11/22/19   [provider]  metoprolol tartrate (LOPRESSOR) 25 MG tablet Take 1 tablet (25 mg total) by mouth 2 (two) times daily. 09/30/19   Roxan Hockey, MD  midodrine (PROAMATINE) 5 MG tablet  Take 1 tablet (5 mg total) by mouth 3 (three) times daily with meals. 09/30/19   Roxan Hockey, MD  Multiple Vitamins-Minerals (PRESERVISION AREDS 2 PO) Take 1 tablet by mouth 2 (two) times daily.    [provider]  multivitamin (RENA-VIT) TABS tablet Take 1 tablet by mouth every evening.  07/06/18   [provider]  Omega-3 Fatty Acids (OMEGA-3 FISH OIL) 300 MG CAPS Take 300 mg by mouth 2 (two) times daily.    [provider]  pantoprazole (PROTONIX) 40 MG tablet Take 1 tablet (40 mg total) by mouth daily before breakfast. 09/30/19   Denton Brick, Courage, MD  Propylene Glycol (SYSTANE BALANCE) 0.6 % SOLN Place 1 drop into both eyes 3 (three) times daily as needed (for burning or dry eyes).     [provider]  RENVELA 800 MG tablet Take 2,400 mg by mouth 3 (three) times daily with meals. Takes 1 tablet with snacks 04/17/15   [provider]  warfarin (COUMADIN) 5 MG tablet TAKE 1 & 1/2 (ONE & ONE-HALF) TABLETS  TO  2  TABLETS  BY MOUTH ONCE  DAILY  AS  DIRECTED  BY  COUMADIN  CLINIC 11/24/19   [facility], Tami Lin, PA    Allergies    Patient has no known allergies.  Review of Systems   Review of Systems  Physical Exam Updated Vital Signs BP (!) 75/47   Pulse (!) 122   Temp 99.3 F (37.4 C)   Resp (!) 23   Ht 5\' 4"  (1.626 m)   Wt 98.9 kg   SpO2 97%   BMI 37.42 kg/m   Physical Exam  ED  Results / Procedures / Treatments   Labs (all labs ordered are listed, but only abnormal results are displayed) Results for orders placed or performed during the hospital encounter of 01/21/2020  SARS Coronavirus 2 by RT PCR (hospital order, performed in [place] hospital lab) Nasopharyngeal Nasopharyngeal Swab   Specimen: Nasopharyngeal Swab  Result Value Ref Range   SARS Coronavirus 2 NEGATIVE NEGATIVE  Comprehensive metabolic panel  Result Value Ref Range   Sodium 137 135 - 145 mmol/L   Potassium 4.7 3.5 - 5.1 mmol/L   Chloride 96 (L) 98 - 111 mmol/L   CO2 20 (L) 22 - 32 mmol/L   Glucose, Bld 428 (H) 70 - 99 mg/dL   BUN 54 (H) 8 - 23 mg/dL   Creatinine, Ser 10.02 (H) 0.44 - 1.00 mg/dL   Calcium 10.8 (H) 8.9 - 10.3 mg/dL   Total Protein 5.7 (L) 6.5 - 8.1 g/dL   Albumin 2.9 (L) 3.5 - 5.0 g/dL   AST 55 (H) 15 - 41 U/L   ALT 55 (H) 0 - 44 U/L   Alkaline Phosphatase 77 38 - 126 U/L   Total Bilirubin 0.8 0.3 - 1.2 mg/dL   GFR calc non Af Amer 3 (L) >60 mL/min   GFR calc Af Amer 4 (L) >60 mL/min   Anion gap 21 (H) 5 - 15  Lipase, blood  Result Value Ref Range   Lipase 33 11 - 51 U/L  Brain natriuretic peptide  Result Value Ref Range   B Natriuretic Peptide 602.0 (H) 0.0 - 100.0 pg/mL  Lactic acid, plasma  Result Value Ref Range   Lactic Acid, Venous 8.6 (HH) 0.5 - 1.9 mmol/L  CBC with Differential  Result Value Ref Range   WBC 12.3 (H) 4.0 - 10.5 K/uL   RBC 1.96 (L) 3.87 - 5.11  MIL/uL   Hemoglobin 7.9 (L) 12.0 - 15.0 g/dL   HCT 24.9 (L) 36 - 46 %   MCV 127.0 (H) 80.0 - 100.0 fL   MCH 40.3 (H) 26.0 - 34.0 pg   MCHC 31.7 30.0 - 36.0 g/dL   RDW 15.9 (H) 11.5 - 15.5 %   Platelets 215 150 - 400 K/uL   nRBC 0.2 0.0 - 0.2 %   Neutrophils Relative % 42 %   Neutro Abs 5.1 1.7 - 7.7 K/uL   Lymphocytes Relative 33 %   Lymphs Abs 4.1 (H) 0.7 - 4.0 K/uL   Monocytes Relative 22 %   Monocytes Absolute 2.7 (H) 0 - 1 K/uL   Eosinophils Relative 2 %   Eosinophils Absolute 0.2 0 - 0  K/uL   Basophils Relative 0 %   Basophils Absolute 0.1 0 - 0 K/uL   Immature Granulocytes 1 %   Abs Immature Granulocytes 0.16 (H) 0.00 - 0.07 K/uL   Abnormal Lymphocytes Present PRESENT    Reactive, Benign Lymphocytes PRESENT   Blood gas, venous  Result Value Ref Range   FIO2 97.00    pH, Ven 7.137 (LL) 7.25 - 7.43   pCO2, Ven 65.7 (H) 44 - 60 mmHg   pO2, Ven 50.2 (H) 32 - 45 mmHg   Bicarbonate 17.2 (L) 20.0 - 28.0 mmol/L   Acid-base deficit 6.5 (H) 0.0 - 2.0 mmol/L   O2 Saturation 65.6 %   Patient temperature 36.0   Protime-INR  Result Value Ref Range   Prothrombin Time 46.0 (H) 11.4 - 15.2 seconds   INR 5.1 (HH) 0.8 - 1.2  APTT  Result Value Ref Range   aPTT 50 (H) 24 - 36 seconds  Troponin I (High Sensitivity)  Result Value Ref Range   Troponin I (High Sensitivity) 39 (H) <18 ng/L   Laboratory interpretation all normal except hyperglycemia, chronic renal failure, drop in her hemoglobin from February when it was 10, respiratory acidosis, this was discussed with respiratory therapist and we increased her respiratory rate from 22-24.  Patient's INR is over therapeutic, her initial troponin is positive, lactic acidosis is present, some moderate elevation of her BNP   EKG EKG Interpretation  Date/Time:  Monday January 19 2020 04:52:34 EDT Ventricular Rate:  139 PR Interval:    QRS Duration: 144 QT Interval:  349 QTC Calculation: 531 R Axis:   -21 Text Interpretation: Sinus tachycardia IVCD, consider atypical RBBB Abnormal T, consider ischemia, lateral leads ST elevation now present in aVR Confirmed by Rolland Porter [id number]) on 01/09/2020 5:01:02 AM   Radiology DG Chest Port 1 View  Result Date: 01/20/2020 CLINICAL DATA:  Intubation.  CPR EXAM: PORTABLE CHEST 1 VIEW COMPARISON:  09/30/2019 FINDINGS: Endotracheal tube with tip 2 cm above the carina. The enteric tube reaches the stomach. Extensive bilateral airspace disease which could be pulmonary edema, aspiration, or pneumonia.  Cardiac enlargement. No visible effusion or air leak. Upper mediastinal widening compared to before with biapical pleural based thickening which is more prominent on the left. These results were called by telephone at the time of interpretation on 02/01/2020 at 6:00 am to provider Thijs Brunton , who verbally acknowledged these results. IMPRESSION: 1. Endotracheal tube tip is 2 cm above the carina. 2. Extensive airspace disease which could be edema, aspiration, or pneumonia. 3. Vascular pedicle widening and pleural base thickening at the left more than right apex. Findings could be related to portable technique but recommend CT when clinically possible to exclude underlying  acute aortic syndrome. Electronically Signed   By: Monte Fantasia M.D.   On: 01/28/2020 06:06    Procedures Procedure Name: Intubation Date/Time: 01/08/2020 6:19 AM Performed by: Rolland Porter, MD Pre-anesthesia Checklist: Patient identified, Emergency Drugs available, Suction available, Patient being monitored and Timeout performed Oxygen Delivery Method: Ambu bag Preoxygenation: Pre-oxygenation with 100% oxygen Ventilation: Mask ventilation without difficulty Laryngoscope Size: Glidescope and 3 Tube type: Non-subglottic suction tube Tube size: 7.5 mm Number of attempts: 1 Placement Confirmation: ETT inserted through vocal cords under direct vision,  CO2 detector and Breath sounds checked- equal and bilateral Secured at: 24 cm Tube secured with: ETT holder Dental Injury: Teeth and Oropharynx as per pre-operative assessment     CPR  Date/Time: 02/02/2020 6:20 AM Performed by: Rolland Porter, MD Authorized by: Rolland Porter, MD  CPR Procedure Details:      Amount of time prior to administration of ACLS/BLS (minutes):  1   CPR/ACLS performed in the ED: Yes     Duration of CPR (minutes):  10   Outcome: ROSC obtained    CPR performed via ACLS guidelines under my direct supervision.  See RN documentation for details including  defibrillator use, medications, doses and timing. .Critical Care Performed by: Rolland Porter, MD Authorized by: Rolland Porter, MD   Critical care provider statement:    Critical care time (minutes):  31   Critical care was necessary to treat or prevent imminent or life-threatening deterioration of the following conditions:  Circulatory failure and respiratory failure   Critical care was time spent personally by me on the following activities:  Discussions with consultants, evaluation of patient's response to treatment, examination of patient, obtaining history from patient or surrogate, ordering and review of laboratory studies, ordering and review of radiographic studies, pulse oximetry and re-evaluation of patient's condition   (including critical care time)  Medications Ordered in ED Medications  vancomycin (VANCOREADY) IVPB 2000 mg/400 mL (2,000 mg Intravenous New Bag/Given 01/18/2020 0558)  phenylephrine (NEOSYNEPHRINE) 10-0.9 MG/250ML-% infusion (30 mcg/min Intravenous Rate/Dose Change 01/21/2020 0620)  EPINEPHrine (ADRENALIN) 1 MG/10ML injection (1 mg Intravenous Given 01/26/2020 0449)  atropine injection (1 mg Intravenous Given 01/27/2020 0449)  sodium bicarbonate injection (50 mEq Intravenous Given 01/15/2020 0448)  calcium chloride injection (1 g Intravenous Given 01/18/2020 0449)  dextrose 50 % solution (1 ampule Intravenous Given 01/17/2020 0452)  insulin aspart (novoLOG) injection 10 Units (10 Units Intravenous Given 01/30/2020 0456)  sodium zirconium cyclosilicate (LOKELMA) packet 10 g (10 g Per Tube Given 01/15/2020 0520)  PHENYLephrine 40 mcg/ml in normal saline Adult IV Push Syringe (For Blood Pressure Support) (120 mcg Intravenous Given 01/30/2020 0542)  metoprolol tartrate (LOPRESSOR) injection 5 mg (5 mg Intravenous Given 01/21/2020 0543)  ceFEPIme (MAXIPIME) 2 g in sodium chloride 0.9 % 100 mL IVPB (2 g Intravenous New Bag/Given 01/23/2020 0551)    ED Course  I have reviewed the triage vital signs and the  nursing notes.  Pertinent labs & imaging results that were available during my care of the patient were reviewed by me and considered in my medical decision making (see chart for details).    MDM Rules/Calculators/A&P                          CPR was started, patient was prepared for intubation and I intubated patient.  Patient received epi 2 rounds, atropine 2 rounds, 1 amp of bicarb, 1 amp of calcium.  When I found out she had end-stage renal  disease and had a AV fistula the rest of the hyperkalemia protocol was followed including 1 amp of D50, 10 units of regular insulin, and when she had her OG inserted she received Lokelma 10 mg.  Her CBG was done prior to giving her the amp of D50 and it was 254.  Patient did develop a rhythm was sinus tachycardia and had a pulse.  CPR was stopped.  Shortly after that patient developed an irregular rhythm.  EKG was done.  After reviewing her EKG and comparing it to her old code STEMI was called.  4:50 AM patient's pulse ox is 100% on the ventilator.  Blood pressure is 140/86 heart rate 125.  Portable chest x-ray was done for placement of ET tube and OG tube.  She appears to me to have a large right-sided infiltrate consistent with pneumonia or aspiration.  5:02 AM patient now was noted to be in atrial fib with heart rate 129-160.  Temp Foley was inserted and her initial temperature was 92, since she is post arrest this was not treated.  5:17 AM heart rate is 156, respiratory rate 23 with 100% pulse ox.  I/O was placed.  Respiratory notes patient did have a gag when she suctions her.  Blood pressure is now 91/52.  Her cuff was on her leg, it was placed on her arm and remained in the low 90s.  05:22 AM Dr Lovena Le, cardiology, feels not a STEMI candidate  After reviewing her x-rays she was given antibiotics for healthcare associated pneumonia.  Her son said she has had pneumonia 3 times in the past year.  5:30 AM patient son was brought back to the family  room.  He states patient has end-stage renal disease and gets dialysis on Monday (today) Wednesdays and Fridays.  He states she has not missed.  He states he checked her oxygen about 1:00 in the morning it was 92% which is her usual.  He woke her up to go to dialysis this morning and she complained of being short of breath.  When he checked her pulse ox it was 50%.  He states she did not complain of chest pain but did have some wheezing.  He asked her if she felt like she needed to come to the hospital and she said yes.  Patient became less responsive on the way to the ED.  Patient is on Coumadin for her atrial fibrillation, PT and PTT were ordered were added to her blood work.  Patient was noted to become hypotensive after she was intubated and she was given a push dose of phenyl ephedrine with transient improvement.    05:51- 06:01 AM    Dr Gillermina Phy, critical care, accepts in transfer to [facility], no ICU beds right now.  He states to go ahead and start her on a phenyl ephedrine drip and not to worry about her heart rate unless it gets extremely high at which point we can give her amiodarone.  He states they do not have an ICU ready but they are getting it ready.  In the meantime we can transfer her to the ED.  He states the attending will be Dutch Quint.  6:03 AM Dr. Leonette Monarch, ED physician at [facility] made aware that patient was being transferred.  06:06 AM radiologist called her chest x-ray report, he recommends CTA be done.  At this point patient is too unstable to go to CT.  Her son said she did not have chest pain only shortness of  breath and hypoxia.  6:30 AM rate is 86- 101, pulse ox is 100% being ventilated.  Respiratory rate is 25.  Blood pressure 70/45, she is on a phenyl ephedrine drip.  EMS has her on the stretcher and are getting ready to get her transported.  I have discussed how to treat the patient and she decompensates in route with the nurse riding with EMS.  She is to give her an amp of  calcium first and then try epinephrine, atropine and bicarb.  If her heart rate gets very fast she was advised to try amiodarone bolus and drip and we discussed those doses.  6:52 AM EMS has left with patient.  Final Clinical Impression(s) / ED Diagnoses Final diagnoses:  Acute respiratory failure with hypoxia and hypercapnia (HCC)  HAP (hospital-acquired pneumonia)  Hypotension, unspecified hypotension type  Cardiac arrest ([place])  Respiratory arrest ([place])  Acute congestive heart failure, unspecified heart failure type ([place])  ESRD (end stage renal disease) on dialysis ([place])  Coagulopathy ([place])    Rx / DC Orders  Transfer to [facility] for admission  Rolland Porter, MD, Barbette Or, MD 01/10/2020 [phone]

## 2020-01-19 NOTE — Progress Notes (Signed)
Report given to [facility] charge RT.

## 2020-01-19 NOTE — Progress Notes (Signed)
69F with hx of CHF and ESRD on HD as an outpatient who was due for dialysis today (did not miss dialysis). She awoke with severe shortness of breath and was saturating ~ 50% per family. EMS called, patient brought to [facility] ED. Patient had a PEA arrest with ROSC after 2 rounds of CPR. Currently borderline BP with SBP in 90s, Afib w/ RVR to 140s, not yet on pressors. She is intubated and saturating 100% at present. CXR consistent with pneumonia vs aspiration (R mid lung field) with superimposed mild-mod volume overload / pulmonary edema. Labs are pending, including potassium level, which is the key determinant in terms of how urgently she might require dialysis.  Agree to transfer patient emergently to [facility]. Condition is emergent life-threatening. Accepting MD is Dr. Tally Due.

## 2020-01-19 NOTE — Progress Notes (Signed)
Order for 1 mg of versed taken from pyxis to use for HD line placement, all of 2mg  were used in vial per verbal order from [facility] NP.   Pulled out 2mg  of versed from pyxis and wasted with Thurmond Butts RN, pulled out another vial of versed to waste as well, but returned immediately to pyxis.

## 2020-01-19 NOTE — ED Provider Notes (Signed)
8:00 AM Patient evaluated on arrival from our affiliated center.  Patient found to have persistent hypotension, is slightly agitated.  She is intubated. I discussed her case with our critical care colleagues for admission.   Carmin Muskrat, MD 01/06/2020 0800

## 2020-01-19 NOTE — ED Notes (Signed)
Date and time results received: 01/14/2020 06:03 (use smartphrase ".now" to insert current time)  Test: Lactic Critical Value: 8.6  Name of Provider Notified: Tomi Bamberger  Orders Received? Or Actions Taken?: physician notified

## 2020-01-19 NOTE — ED Notes (Signed)
Ng tube verified by Dr Tomi Bamberger, ok to use NG tube for medication

## 2020-01-19 NOTE — Code Documentation (Addendum)
Pt had return of pulses, a-fib on monitor, Dr Tomi Bamberger remains at bedside,

## 2020-01-19 NOTE — Progress Notes (Signed)
Pharmacy Antibiotic Note  Sabrina Beck is a 79 y.o. female admitted on 01/30/2020 with sepsis.  Pharmacy has been consulted for cefepime and vancomycin dosing.  ESRD-HD usually MWF  Plan: Vancomycin 2000mg  IV x 1, then 1000mg  IV qHD Cefepime 1g IV every 24 hours Monitor HD schedule, Cx and clinical progression to narrow Vancomycin trough at steady state  Height: 5\' 4"  (162.6 cm) Weight: 98.9 kg (218 lb) IBW/kg (Calculated) : 54.7  Temp (24hrs), Avg:98.5 F (36.9 C), Min:92.8 F (33.8 C), Max:99.3 F (37.4 C)  Recent Labs  Lab 01/30/2020 0515  WBC 12.3*  CREATININE 10.02*  LATICACIDVEN 8.6*    Estimated Creatinine Clearance: 5.1 mL/min (A) (by C-G formula based on SCr of 10.02 mg/dL (H)).    No Known Allergies  Bertis Ruddy, PharmD Clinical Pharmacist ED Pharmacist Phone # [phone] 02/02/2020 9:01 AM

## 2020-01-19 NOTE — Procedures (Signed)
Central Venous Catheter Insertion Procedure Note Sabrina Beck [id number] 1941/07/10  Procedure: Insertion of Central Venous Catheter Indications: Drug and/or fluid administration  Procedure Details Consent: Risks of procedure as well as the alternatives and risks of each were explained to the (patient/caregiver).  Consent for procedure obtained. Time Out: Verified patient identification, verified procedure, site/side was marked, verified correct patient position, special equipment/implants available, medications/allergies/relevent history reviewed, required imaging and test results available.  Performed  Maximum sterile technique was used including antiseptics, cap, gloves, gown, hand hygiene, mask and sheet. Skin prep: Chlorhexidine; local anesthetic administered A antimicrobial bonded/coated triple lumen catheter was placed in the left internal jugular vein using the Seldinger technique. Catheter placed to 20 cm. Blood aspirated via all 3 ports and then flushed x 3. Line sutured x 2 and dressing applied.  Ultrasound guidance used.Yes.    Evaluation Blood flow good Complications: No apparent complications Patient did tolerate procedure well. Chest X-ray ordered to verify placement.  CXR: pending.        Georgann Housekeeper, AGACNP-BC [place]  See Amion for personal pager PCCM on call pager [phone]  01/15/2020 5:15 PM

## 2020-01-19 NOTE — Progress Notes (Signed)
PIV consult: L arm restricted. 2 IV Team nurses assessed RUE with ultrasound. No suitable vein found for PIV. Pt is not a candidate for midline or PICC due to dialysis. Please consider central line for vascular access.

## 2020-01-19 NOTE — Consult Note (Addendum)
Renal Service Consult Note [facility]  Sabrina Beck 01/06/2020 [place] Requesting Physician:  Dr Lynetta Mare  Reason for Consult:  ESRD pt w/ shock/ PNA HPI: The patient is a 79 y.o. year-old w/ hx of HTN, uterine cancer, HL, ESRD on HD, diast CHF, atrial fibrillation, anemia brought to ED by family c/o SOB. She coded in ED ([facility]) just after arriving and was resuscitated. CXR showed large R infiltrate c/w PNA or aspiration. In ED went into afib as well, HR 129-160. Was not a candidate for STEMI per cards. She was given IV abx for sepsis/ PNA.  She was started on pressors and admitted to ICU.  Today is HD day (MWF), asked to see for ESRD.    K+ in ED was okay at 4.7, Na 137  BUN 54 , Cr 10.0 and Ca 10.8, alb 2.9.  AST 55, ALT 55, tbili 0.8.  CXR showed "extensive airspace disease which could be edema, aspiration, or pneumonia.", amongst other findings.   Pt takes HD MWF at [facility] using L FA AVF. Hx of combined s/d CHF, afib/ RVR and PNA 3x last year per ED notes.   Last admit was 09/2019 - PNA, afib, ESRD, sepsis Prior admit  Sept 2020 - acute resp failure/ lobar PNA, sepsis, EF 45% by echo, IV infiltration, suspected ischemic colitis improved clinically. GOC no CPR but yes to intubation.   ROS - n/a   Past Medical History  Past Medical History:  Diagnosis Date  . Anemia   . Atrial fibrillation ([place])   . Chest pain 05/15/2006   Stress test negative for ischemia  . Diastolic heart failure ([place])   . Diverticulosis   . ESRD on hemodialysis ([place])   . GERD (gastroesophageal reflux disease)   . Headache   . Mixed hyperlipidemia   . Peripheral neuropathy   . PONV (postoperative nausea and vomiting)   . Systemic hypertension   . Uterine cancer [facility])    Past Surgical History  Past Surgical History:  Procedure Laterality Date  . ABDOMINAL HYSTERECTOMY    . APPENDECTOMY    . AV FISTULA PLACEMENT Left 11/17/2013   Procedure: CREATION OF LEFT  RADIOCEPHALIC ARTERIOVENOUS (AV) FISTULA ;  Surgeon: [name], MD;  Location: [place];  Service: Vascular;  Laterality: Left;  . BREAST SURGERY Left    tumors non canerous 2- 3 different surgeries  . CATARACT EXTRACTION W/PHACO Right 10/26/2016   Procedure: CATARACT EXTRACTION PHACO AND INTRAOCULAR LENS PLACEMENT (IOC) CDE= 11.91;  Surgeon: Tonny Branch, MD;  Location: [facility];  Service: Ophthalmology;  Laterality: Right;  right - pt knows to arrive at 6:30  . CATARACT EXTRACTION W/PHACO Left 11/30/2016   Procedure: CATARACT EXTRACTION PHACO AND INTRAOCULAR LENS PLACEMENT (IOC) CDE - 7.92 ;  Surgeon: Tonny Branch, MD;  Location: [facility];  Service: Ophthalmology;  Laterality: Left;  left  . CHOLECYSTECTOMY    . COLONOSCOPY W/ BIOPSIES AND POLYPECTOMY    . FISTULA SUPERFICIALIZATION Left 05/19/2014   Procedure: FISTULA SUPERFICIALIZATION-LEFT ARM;  Surgeon: [name], MD;  Location: [place];  Service: Vascular;  Laterality: Left;  . INSERTION OF DIALYSIS CATHETER N/A 01/24/2014   Procedure: INSERTION OF DIALYSIS CATHETER;  Surgeon: Rosetta Posner, MD;  Location: [facility] OR;  Service: Vascular;  Laterality: N/A;  . KNEE ARTHROSCOPY    . TUBAL LIGATION     Family History  Family History  Problem Relation Age of Onset  . Diabetes Mother   .  Hypertension Mother   . Diabetes Father   . Heart disease Father        before age 2  . Heart attack Father   . Diabetes Sister   . Hyperlipidemia Sister   . Hypertension Sister   . Cancer Brother   . Diabetes Brother   . Heart disease Brother   . Hypertension Brother   . Heart attack Brother   . Colon cancer Neg Hx    Social History  reports that she quit smoking about 20 years ago. She has never used smokeless tobacco. She reports that she does not drink alcohol and does not use drugs. Allergies No Known Allergies Home medications Prior to Admission medications   Medication Sig Start Date End Date Taking? Authorizing Provider  acetaminophen  (TYLENOL) 500 MG tablet Take 1,000 mg by mouth every 6 (six) hours as needed for moderate pain.    Yes [provider]  albuterol (VENTOLIN HFA) 108 (90 Base) MCG/ACT inhaler Inhale 2 puffs into the lungs every 4 (four) hours as needed for wheezing or shortness of breath (cough, shortness of breath or wheezing.). 04/25/19  Yes Johnson, Clanford L, MD  Biotin (BIOTIN MAXIMUM STRENGTH) 5 MG CAPS Take 5 mg by mouth daily.   Yes [provider]  gabapentin (NEURONTIN) 100 MG capsule Take 100 mg by mouth 2 (two) times daily.  09/13/18  Yes [provider]  guaiFENesin (MUCINEX) 600 MG 12 hr tablet Take 1 tablet (600 mg total) by mouth 2 (two) times daily. 09/30/19  Yes Emokpae, Courage, MD  levothyroxine (SYNTHROID) 75 MCG tablet Take 75 mcg by mouth daily. 01/07/20  Yes [provider]  metoprolol tartrate (LOPRESSOR) 25 MG tablet Take 1 tablet (25 mg total) by mouth 2 (two) times daily. 09/30/19  Yes Emokpae, Courage, MD  midodrine (PROAMATINE) 5 MG tablet Take 1 tablet (5 mg total) by mouth 3 (three) times daily with meals. 09/30/19  Yes Emokpae, Courage, MD  Multiple Vitamins-Minerals (PRESERVISION AREDS 2 PO) Take 1 tablet by mouth 2 (two) times daily.   Yes [provider]  multivitamin (RENA-VIT) TABS tablet Take 1 tablet by mouth every evening.  07/06/18  Yes [provider]  Omega-3 Fatty Acids (OMEGA-3 FISH OIL) 300 MG CAPS Take 300 mg by mouth 2 (two) times daily.   Yes [provider]  omeprazole (PRILOSEC OTC) 20 MG tablet Take 20 mg by mouth daily.   Yes [provider]  Propylene Glycol (SYSTANE BALANCE) 0.6 % SOLN Place 1 drop into both eyes 3 (three) times daily as needed (for burning or dry eyes).    Yes [provider]  RENVELA 800 MG tablet Take 2,400 mg by mouth 3 (three) times daily with meals. Takes 1 tablet with snacks 04/17/15  Yes [provider]  warfarin (COUMADIN) 5 MG tablet TAKE 1 & 1/2 (ONE &  ONE-HALF) TABLETS  TO  2  TABLETS  BY MOUTH ONCE  DAILY  AS  DIRECTED  BY  COUMADIN  CLINIC Patient taking differently: Take 5-10 mg by mouth See admin instructions. TAKE  2  TABLETS  (10mg )  BY MOUTH ONCE  DAILY  AS  DIRECTED  BY  COUMADIN  CLINIC Except 1 tablet (5mg ) on Friday. 11/24/19  Yes Duke, Tami Lin, PA  fluticasone (FLOVENT HFA) 44 MCG/ACT inhaler Inhale 2 puffs into the lungs 2 (two) times daily. Patient not taking: Reported on 11/25/2019 04/25/19 04/24/20  Murlean Iba, MD  pantoprazole (PROTONIX) 40 MG  tablet Take 1 tablet (40 mg total) by mouth daily before breakfast. Patient not taking: Reported on 01/11/2020 09/30/19   Roxan Hockey, MD     Vitals:   01/18/2020 1000 01/23/2020 1100 01/18/2020 1200 01/07/2020 1300  BP: 94/64 101/61 119/70   Pulse: 95 92  91  Resp: (!) 24 (!) 7 (!) 24 (!) 24  Temp: 98.8 F (37.1 C) 98.6 F (37 C) 98.4 F (36.9 C) 98.4 F (36.9 C)  TempSrc:      SpO2: 100% 100%  98%  Weight:      Height:       Exam Gen on vent, sedated, ETT in place No rash, cyanosis or gangrene Sclera anicteric  No jvd or bruits Chest clear bilat ant/ lat RRR no MRG Abd soft ntnd no mass or ascites +bs GU w / foley cath no urine in tube MS no joint effusions or deformity Ext no sig LE edema, no wounds or ulcers Neuro as above, not responding LFA AVF+bruit   Home meds:  - metoprolol 25 bid/ midodrine 5 tid  - gabapentin 100 bid  - renvela 3 ac tid/ omperazole 20 qd/ synthorid 75 ug  - warfarin 5-10 mg qd  - prn's/ vitamins/ supplements    OP HD: [facility]   3.5h  2/2.5 bath  97.5kg  No heparin  LFA AVF  - EPO 6600 q Rx   Assessment/ Plan: 1. Cardiac arrest - resuscitated in ED 2. Sepsis/ shock - poss PNA (+CXR infiltrates), vs other. On IV abx per CCM 3. ESRD - on HD MWF.  Will need CRRT until hemodynamics improve, have d/w primary team. See orders. **Updated 3:45pm - doing better, coming off pressors, no urgent RRT indication > will plan  for regular HD in am using AVF 4. BP/ volume - in shock on pressors, no sig edema on exam.  CXR findings could be infectious or edema. Wt's are not up significantly.  Keep even w/ CRRT.  5. Atrial fibrillation 6. Chronic s/d CHF  7. MBD ckd - resume binders when eating 8. Anemia ckd - Hb low 7.7, cont ESA, will consult pharm for EPO equivalent      Kelly Splinter  MD 01/22/2020, 1:08 PM  Recent Labs  Lab 01/18/2020 0515 01/24/2020 0836  WBC 12.3*  --   HGB 7.9* 7.5*   Recent Labs  Lab 02/03/2020 0515 02/02/2020 0836  K 4.7 4.4  BUN 54*  --   CREATININE 10.02*  --   CALCIUM 10.8*  --

## 2020-01-19 NOTE — ED Notes (Signed)
Critical value- PH 7.137 Reported to IVA KNAPP ADJUST VENT RATE

## 2020-01-19 NOTE — Progress Notes (Signed)
RT note: RT and RN transported vent patient from ED to CT and then to 30M #12. Vital signs stable through out.

## 2020-01-19 NOTE — ED Notes (Signed)
Pt's glasses and pair of shoes given to pt's son,

## 2020-01-19 NOTE — ED Notes (Signed)
Called ems 06:09    Emergency traffic

## 2020-01-19 NOTE — ED Notes (Signed)
Date and time results received: 01/24/2020 06:15 (use smartphrase ".now" to insert current time)  Test: inr 5.1 Critical Value: inr 5.1  Name of Provider Notified: Dr Tomi Bamberger  Orders Received? Or Actions Taken?: see emr

## 2020-01-19 NOTE — ED Triage Notes (Signed)
Pt arrived to er by family with c/o sob, when staff arrived to help pt out of the car pt was blue, resp distress noted, upon putting pt on stretcher pt became lost pulses.

## 2020-01-19 NOTE — ED Notes (Signed)
I O placed in left tibia 15 ga 35mm

## 2020-01-19 NOTE — Progress Notes (Signed)
Initial Nutrition Assessment  DOCUMENTATION CODES:   Obesity unspecified  INTERVENTION:   Tube feeding recommendations: - Vital High Protein @ 55 ml/hr (1320 ml/day)  Recommended tube feeding regimen would provide 1320 kcal, 116 grams of protein, and 1104 ml of H2O.  - Recommend B-complex with vitamin C while pt is on CRRT  NUTRITION DIAGNOSIS:   Inadequate oral intake related to inability to eat as evidenced by NPO status.  GOAL:   Provide needs based on ASPEN/SCCM guidelines  MONITOR:   Vent status, Labs, Weight trends, Skin, I & O's  REASON FOR ASSESSMENT:   Ventilator    ASSESSMENT:   79 year old female who presented on 6/14 with respiratory distress and cardiac arrest immediately upon arrival to the ED. Pt required intubation. PMH of ESRD on HD, atrial fibrillation, CHF. Pt did not require TTM.   Discussed pt with RN and during ICU rounds.  OG tube in place, currently clamped. No TF today per CCM.  Pt starting CRRT today per Nephrology note. Recommend addition of B-complex with vitamin C while pt is on CRRT.  RD will leave TF recommendations.  Patient is currently intubated on ventilator support MV: 10.8 L/min Temp (24hrs), Avg:98.5 F (36.9 C), Min:92.8 F (33.8 C), Max:99.3 F (37.4 C) BP (cuff): 97/55 MAP (cuff): 68  Drips: Precedex: 17.3 ml/hr Neosynephrine: 165 ml/hr  Medications reviewed and include: colace, SSI q 4 hours, protonix, miralax, IV abx  Labs reviewed: hemoglobin 7.5 CBG's: 92-146  NUTRITION - FOCUSED PHYSICAL EXAM:    Most Recent Value  Orbital Region No depletion  Upper Arm Region No depletion  Thoracic and Lumbar Region No depletion  Buccal Region Unable to assess  Temple Region No depletion  Clavicle Bone Region No depletion  Clavicle and Acromion Bone Region Mild depletion  Scapular Bone Region Unable to assess  Dorsal Hand No depletion  Patellar Region No depletion  Anterior Thigh Region No depletion  Posterior  Calf Region No depletion  Edema (RD Assessment) Mild  Hair Reviewed  Eyes Unable to assess  Mouth Unable to assess  Skin Reviewed  Nails Reviewed       Diet Order:   Diet Order            Diet NPO time specified  Diet effective now                 EDUCATION NEEDS:   No education needs have been identified at this time  Skin:  Skin Assessment: Reviewed RN Assessment (MASD to sacrum, back)  Last BM:  01/08/2020  Height:   Ht Readings from Last 1 Encounters:  01/06/2020 5\' 4"  (1.626 m)    Weight:   Wt Readings from Last 1 Encounters:  01/30/2020 98.9 kg   EDW: 97.5 kg  Ideal Body Weight:  54.5 kg  BMI:  Body mass index is 37.42 kg/m.  Estimated Nutritional Needs:   Kcal:  1884-1660  Protein:  109-130 grams  Fluid:  1000 ml + UOP    Gaynell Face, MS, RD, LDN Inpatient Clinical Dietitian Pager: [phone] Weekend/After Hours: [phone]

## 2020-01-19 NOTE — H&P (Signed)
NAME:  Sabrina Beck, MRN:  [id number], DOB:  1940/12/16, LOS: 0 ADMISSION DATE:  01/20/2020, CONSULTATION DATE: 6/14 REFERRING MD: Dr. Vanita Panda EDP, CHIEF COMPLAINT: Cardiac arrest  Brief History   79 year old female with ESRD on HD presented to [facility] emergency department 6/14 with respiratory distress and suffered cardiac arrest immediately upon arrival.  Brief arrest and responsive after.  Intubated during arrest and transferred to [facility] ED for ICU admission.  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  She has been compliant with dialysis and has reportedly not missed a treatment.  Last treatment 4/11.  In the morning hours of 6/14 when her son who she lives with attempted to wake her up for dialysis she was difficult to arouse and complained of trouble breathing.  He transported her to the emergency department and she became unresponsive as they pulled into the parking lot.  ED staff immediately realized she was pulseless and began resuscitative efforts.  Presenting rhythm was asystole.  The patient underwent what sounds like 2 rounds of CPR including epinephrine, atropine, and hyperkalemia treatment.  There was concern for STEMI based on rhythm change however, EKG was reviewed by cardiology who was not quite as concerned.  The patient was transferred to [facility] emergency department with plans for admission to ICU when bed became available.  In [facility] emergency department the patient was awake on the ventilator and able to respond to nursing.  She was given given sedation for vent comfort.  She was found to be hypotensive and was started on phenylephrine IV infusion. PCCM called for admission.   Past Medical History   has a past  medical history of Anemia, Atrial fibrillation ([place]), Chest pain (96/28/3662), Diastolic heart failure ([place]), Diverticulosis, ESRD on hemodialysis ([place]), GERD (gastroesophageal reflux disease), Headache, Mixed hyperlipidemia, Peripheral neuropathy, PONV (postoperative nausea and vomiting), Systemic hypertension, and Uterine cancer ([place]).   Significant Hospital Events   6/14 admit for cardiac arrest  Consults:  Nephrology  Procedures:  ETT 6/14 > CVL 6/14 >  Significant Diagnostic Tests:  CXR on presentation: R>L infiltrate   Micro Data:  Blood 6/14 > Tracheal aspirate 6/14 >  Antimicrobials:  Cefepime 6/14 > Vancomycin 6/14 >  Interim history/subjective:    Objective   Blood pressure (!) 82/54, pulse 93, temperature 99.2 F (37.3 C), resp. rate (!) 24, height 5\' 4"  (1.626 m), weight 98.9 kg, SpO2 100 %.    Vent Mode: PRVC FiO2 (%):  [100 %] 100 % Set Rate:  [22 bmp-24 bmp] 24 bmp Vt Set:  [430 mL] 430 mL PEEP:  [5 cmH20] 5 cmH20 Plateau Pressure:  [22 cmH20-25 cmH20] 22 cmH20   Intake/Output Summary (Last 24 hours) at 01/11/2020 0829 Last data filed at 01/31/2020 9476 Gross per 24 hour  Intake 270 ml  Output --  Net 270 ml   Filed Weights   02/01/2020 0512  Weight: 98.9 kg    Examination: General: Obese female on vent HENT: [place]/AT, PERRL, no JVD Lungs: Diminished R Cardiovascular: Tachy, IRIR, no MRG Abdomen: Soft, non-tender, non-distended Extremities: No acute deformity or ROM limitation Neuro: Sedated RASS -3. Reportedly was awake and following commands per ED nurse.  GU: Foley, no urine in collection bag.   Resolved Hospital Problem list  Assessment & Plan:   Acute hypoxemic respiratory failure: History of multiple pneumonias recently and has a R sided infiltrate on CXR. Likely a component of pulmonary edema as well.  - Full vent support - Increase PEEP to 8 - VAP bundle, daily SBT - Empiric cefepime, vanco - Follow cultures  Shock: septic vs  cardiogenic  Lactic acidosis - Hemodynamic monitoring ICU - Phenylephrine continue, low threshold to switch to levophed - MAP goal 66mmHg - Give 1 L LR now. This will be first IVF bolus - Ensure lactic clearing  Cardiac arrest: PEA/Asystole. Awake and following commands on arrival to ED. Likely a primary respiratory arrest. ROSC with 2 rounds CPR.  - Admit to ICU - CT head given supra-therapeutic INR - Does not need TTM - Supportive care   ESRD on HD - No urgent indication for HD - Will consult nephrology - Follow BMP  Hyperglycemia with no documented history of DM - Hydrate - CBG monitoring and SSI  Atrial fibrillation: on warfarin, metoprolol at home - Hold metoprolol due to shock - Hold warfarin given supra-thearpeutic INR - Trend coags  Best practice:  Diet: NPO Pain/Anxiety/Delirium protocol (if indicated): Precedex infusion, PRN fentanyl VAP protocol (if indicated): Per protocol DVT prophylaxis: Hold. Supratherapeutic INR GI prophylaxis: PPI Glucose control: SSI Mobility: BR Code Status: FULL (has been DNR in the past), son at bedside endorses full code.  Family Communication: Son updated bedside in ED Disposition: ICU  Labs   CBC: Recent Labs  Lab 01/09/2020 0515  WBC 12.3*  NEUTROABS 5.1  HGB 7.9*  HCT 24.9*  MCV 127.0*  PLT 580    Basic Metabolic Panel: Recent Labs  Lab 01/07/2020 0515  NA 137  K 4.7  CL 96*  CO2 20*  GLUCOSE 428*  BUN 54*  CREATININE 10.02*  CALCIUM 10.8*   GFR: Estimated Creatinine Clearance: 5.1 mL/min (A) (by C-G formula based on SCr of 10.02 mg/dL (H)). Recent Labs  Lab 01/18/2020 0515  WBC 12.3*  LATICACIDVEN 8.6*    Liver Function Tests: Recent Labs  Lab 02/01/2020 0515  AST 55*  ALT 55*  ALKPHOS 77  BILITOT 0.8  PROT 5.7*  ALBUMIN 2.9*   Recent Labs  Lab 01/13/2020 0515  LIPASE 33   No results for input(s): AMMONIA in the last 168 hours.  ABG    Component Value Date/Time   PHART 7.429 04/11/2019  1812   PCO2ART 47.1 04/11/2019 1812   PO2ART 56.0 (L) 04/11/2019 1812   HCO3 17.2 (L) 01/08/2020 0515   TCO2 22 08/18/2018 1106   ACIDBASEDEF 6.5 (H) 02/01/2020 0515   O2SAT 65.6 01/28/2020 0515     Coagulation Profile: Recent Labs  Lab 01/25/2020 0515  INR 5.1*    Cardiac Enzymes: No results for input(s): CKTOTAL, CKMB, CKMBINDEX, TROPONINI in the last 168 hours.  HbA1C: No results found for: HGBA1C  CBG: No results for input(s): GLUCAP in the last 168 hours.  Review of Systems:   Patient is encephalopathic and/or intubated. Therefore history has been obtained from chart review.    Past Medical History  She,  has a past medical history of Anemia, Atrial fibrillation ([place]), Chest pain (99/83/3825), Diastolic heart failure ([place]), Diverticulosis, ESRD on hemodialysis ([place]), GERD (gastroesophageal reflux disease), Headache, Mixed hyperlipidemia, Peripheral neuropathy, PONV (postoperative nausea and vomiting), Systemic hypertension, and Uterine cancer ([place]).   Surgical History    Past Surgical History:  Procedure Laterality Date  . ABDOMINAL HYSTERECTOMY    . APPENDECTOMY    .  AV FISTULA PLACEMENT Left 11/17/2013   Procedure: CREATION OF LEFT RADIOCEPHALIC ARTERIOVENOUS (AV) FISTULA ;  Surgeon: [name], MD;  Location: [place];  Service: Vascular;  Laterality: Left;  . BREAST SURGERY Left    tumors non canerous 2- 3 different surgeries  . CATARACT EXTRACTION W/PHACO Right 10/26/2016   Procedure: CATARACT EXTRACTION PHACO AND INTRAOCULAR LENS PLACEMENT (IOC) CDE= 11.91;  Surgeon: Tonny Branch, MD;  Location: [facility];  Service: Ophthalmology;  Laterality: Right;  right - pt knows to arrive at 6:30  . CATARACT EXTRACTION W/PHACO Left 11/30/2016   Procedure: CATARACT EXTRACTION PHACO AND INTRAOCULAR LENS PLACEMENT (IOC) CDE - 7.92 ;  Surgeon: Tonny Branch, MD;  Location: [facility];  Service: Ophthalmology;  Laterality: Left;  left  . CHOLECYSTECTOMY    . COLONOSCOPY W/ BIOPSIES AND  POLYPECTOMY    . FISTULA SUPERFICIALIZATION Left 05/19/2014   Procedure: FISTULA SUPERFICIALIZATION-LEFT ARM;  Surgeon: [name], MD;  Location: [place];  Service: Vascular;  Laterality: Left;  . INSERTION OF DIALYSIS CATHETER N/A 01/24/2014   Procedure: INSERTION OF DIALYSIS CATHETER;  Surgeon: Rosetta Posner, MD;  Location: [facility] OR;  Service: Vascular;  Laterality: N/A;  . KNEE ARTHROSCOPY    . TUBAL LIGATION       Social History   reports that she quit smoking about 20 years ago. She has never used smokeless tobacco. She reports that she does not drink alcohol and does not use drugs.   Family History   Her family history includes Cancer in her brother; Diabetes in her brother, father, mother, and sister; Heart attack in her brother and father; Heart disease in her brother and father; Hyperlipidemia in her sister; Hypertension in her brother, mother, and sister. There is no history of Colon cancer.   Allergies No Known Allergies   Home Medications  Prior to Admission medications   Medication Sig Start Date End Date Taking? Authorizing Provider  acetaminophen (TYLENOL) 500 MG tablet Take 1,000 mg by mouth every 6 (six) hours as needed for moderate pain.     [provider]  albuterol (VENTOLIN HFA) 108 (90 Base) MCG/ACT inhaler Inhale 2 puffs into the lungs every 4 (four) hours as needed for wheezing or shortness of breath (cough, shortness of breath or wheezing.). 04/25/19   Murlean Iba, MD  Cholecalciferol (VITAMIN D) 2000 UNITS CAPS Take 2,000 Units by mouth every evening.     [provider]  fluticasone (FLOVENT HFA) 44 MCG/ACT inhaler Inhale 2 puffs into the lungs 2 (two) times daily. Patient not taking: Reported on 11/25/2019 04/25/19 04/24/20  Murlean Iba, MD  gabapentin (NEURONTIN) 100 MG capsule Take 100 mg by mouth 2 (two) times daily.  09/13/18   [provider]  guaiFENesin (MUCINEX) 600 MG 12 hr tablet Take 1 tablet (600 mg total) by  mouth 2 (two) times daily. Patient not taking: Reported on 11/25/2019 09/30/19   Roxan Hockey, MD  levothyroxine (SYNTHROID) 50 MCG tablet Take 50 mcg by mouth daily. 11/22/19   [provider]  metoprolol tartrate (LOPRESSOR) 25 MG tablet Take 1 tablet (25 mg total) by mouth 2 (two) times daily. 09/30/19   Roxan Hockey, MD  midodrine (PROAMATINE) 5 MG tablet Take 1 tablet (5 mg total) by mouth 3 (three) times daily with meals. 09/30/19   Roxan Hockey, MD  Multiple Vitamins-Minerals (PRESERVISION AREDS 2 PO) Take 1 tablet by mouth 2 (two) times daily.    [provider]  multivitamin (RENA-VIT) TABS  tablet Take 1 tablet by mouth every evening.  07/06/18   [provider]  Omega-3 Fatty Acids (OMEGA-3 FISH OIL) 300 MG CAPS Take 300 mg by mouth 2 (two) times daily.    [provider]  pantoprazole (PROTONIX) 40 MG tablet Take 1 tablet (40 mg total) by mouth daily before breakfast. 09/30/19   Denton Brick, Cour[place], MD  Propylene Glycol (SYSTANE BALANCE) 0.6 % SOLN Place 1 drop into both eyes 3 (three) times daily as needed (for burning or dry eyes).     [provider]  RENVELA 800 MG tablet Take 2,400 mg by mouth 3 (three) times daily with meals. Takes 1 tablet with snacks 04/17/15   [provider]  warfarin (COUMADIN) 5 MG tablet TAKE 1 & 1/2 (ONE & ONE-HALF) TABLETS  TO  2  TABLETS  BY MOUTH ONCE  DAILY  AS  DIRECTED  BY  COUMADIN  CLINIC 11/24/19   [facility], Tami Lin, PA     Critical care time: 50 mins     Georgann Housekeeper, AGACNP-BC [place] for personal pager PCCM on call pager [phone]  01/26/2020 8:56 AM

## 2020-01-20 ENCOUNTER — Inpatient Hospital Stay (HOSPITAL_COMMUNITY): Payer: Medicare HMO

## 2020-01-20 LAB — CBC
HCT: 23.1 % — ABNORMAL LOW (ref 36.0–46.0)
Hemoglobin: 7.3 g/dL — ABNORMAL LOW (ref 12.0–15.0)
MCH: 39 pg — ABNORMAL HIGH (ref 26.0–34.0)
MCHC: 31.6 g/dL (ref 30.0–36.0)
MCV: 123.5 fL — ABNORMAL HIGH (ref 80.0–100.0)
Platelets: 204 10*3/uL (ref 150–400)
RBC: 1.87 MIL/uL — ABNORMAL LOW (ref 3.87–5.11)
RDW: 15.7 % — ABNORMAL HIGH (ref 11.5–15.5)
WBC: 20.4 10*3/uL — ABNORMAL HIGH (ref 4.0–10.5)
nRBC: 0.3 % — ABNORMAL HIGH (ref 0.0–0.2)

## 2020-01-20 LAB — GLUCOSE, CAPILLARY
Glucose-Capillary: 91 mg/dL (ref 70–99)
Glucose-Capillary: 101 mg/dL — ABNORMAL HIGH (ref 70–99)
Glucose-Capillary: 96 mg/dL (ref 70–99)
Glucose-Capillary: 98 mg/dL (ref 70–99)
Glucose-Capillary: 118 mg/dL — ABNORMAL HIGH (ref 70–99)
Glucose-Capillary: 124 mg/dL — ABNORMAL HIGH (ref 70–99)

## 2020-01-20 LAB — BASIC METABOLIC PANEL
Anion gap: 22 — ABNORMAL HIGH (ref 5–15)
BUN: 76 mg/dL — ABNORMAL HIGH (ref 8–23)
CO2: 18 mmol/L — ABNORMAL LOW (ref 22–32)
Calcium: 9.9 mg/dL (ref 8.9–10.3)
Chloride: 102 mmol/L (ref 98–111)
Creatinine, Ser: 10.71 mg/dL — ABNORMAL HIGH (ref 0.44–1.00)
GFR calc Af Amer: 4 mL/min — ABNORMAL LOW (ref >60)
GFR calc non Af Amer: 3 mL/min — ABNORMAL LOW (ref >60)
Glucose, Bld: 109 mg/dL — ABNORMAL HIGH (ref 70–99)
Potassium: 6.2 mmol/L — ABNORMAL HIGH (ref 3.5–5.1)
Sodium: 142 mmol/L (ref 135–145)

## 2020-01-20 LAB — RENAL FUNCTION PANEL
Albumin: 2.8 g/dL — ABNORMAL LOW (ref 3.5–5.0)
Anion gap: 22 — ABNORMAL HIGH (ref 5–15)
BUN: 85 mg/dL — ABNORMAL HIGH (ref 8–23)
CO2: 16 mmol/L — ABNORMAL LOW (ref 22–32)
Calcium: 9.7 mg/dL (ref 8.9–10.3)
Chloride: 105 mmol/L (ref 98–111)
Creatinine, Ser: 10.31 mg/dL — ABNORMAL HIGH (ref 0.44–1.00)
GFR calc Af Amer: 4 mL/min — ABNORMAL LOW (ref >60)
GFR calc non Af Amer: 3 mL/min — ABNORMAL LOW (ref >60)
Glucose, Bld: 102 mg/dL — ABNORMAL HIGH (ref 70–99)
Phosphorus: 7.8 mg/dL — ABNORMAL HIGH (ref 2.5–4.6)
Potassium: 5.9 mmol/L — ABNORMAL HIGH (ref 3.5–5.1)
Sodium: 143 mmol/L (ref 135–145)

## 2020-01-20 LAB — PREPARE FRESH FROZEN PLASMA

## 2020-01-20 LAB — PROTIME-INR
INR: 6.1 (ref 0.8–1.2)
Prothrombin Time: 52.8 seconds — ABNORMAL HIGH (ref 11.4–15.2)

## 2020-01-20 LAB — BPAM FFP
Blood Product Expiration Date: 202106142359
ISSUE DATE / TIME: 202106141331
Unit Type and Rh: 6200

## 2020-01-20 LAB — PHOSPHORUS: Phosphorus: 8 mg/dL — ABNORMAL HIGH (ref 2.5–4.6)

## 2020-01-20 LAB — MAGNESIUM: Magnesium: 2.1 mg/dL (ref 1.7–2.4)

## 2020-01-20 MED ORDER — PRISMASOL BGK 4/2.5 32-4-2.5 MEQ/L REPLACEMENT SOLN
Status: DC
  Filled 2020-01-20 (×14): qty 5000

## 2020-01-20 MED ORDER — B COMPLEX-C PO TABS
1.0000 | ORAL_TABLET | Freq: Every day | ORAL | Status: DC
  Administered 2020-01-20 – 2020-02-02 (×14): 1
  Filled 2020-01-20 (×14): qty 1

## 2020-01-20 MED ORDER — HEPARIN SODIUM (PORCINE) 1000 UNIT/ML DIALYSIS
1000.0000 [IU] | INTRAMUSCULAR | Status: DC | PRN
  Filled 2020-01-20: qty 3
  Filled 2020-01-20: qty 6

## 2020-01-20 MED ORDER — SODIUM CHLORIDE 0.9 % FOR CRRT: INTRAVENOUS_CENTRAL | Status: DC | PRN

## 2020-01-20 MED ORDER — LEVOTHYROXINE SODIUM 75 MCG PO TABS
75.0000 ug | ORAL_TABLET | Freq: Every day | ORAL | Status: DC
  Administered 2020-01-20 – 2020-02-02 (×14): 75 ug
  Filled 2020-01-20 (×4): qty 3
  Filled 2020-01-20: qty 1
  Filled 2020-01-20 (×8): qty 3
  Filled 2020-01-20: qty 1

## 2020-01-20 MED ORDER — MIDODRINE HCL 5 MG PO TABS
5.0000 mg | ORAL_TABLET | Freq: Three times a day (TID) | ORAL | Status: DC
  Administered 2020-01-20 – 2020-01-22 (×6): 5 mg
  Filled 2020-01-20 (×6): qty 1

## 2020-01-20 MED ORDER — PRISMASOL BGK 4/2.5 32-4-2.5 MEQ/L IV SOLN
INTRAVENOUS | Status: DC
  Filled 2020-01-20 (×71): qty 5000

## 2020-01-20 MED ORDER — SODIUM CHLORIDE 0.9 % IV SOLN
2.0000 g | Freq: Two times a day (BID) | INTRAVENOUS | Status: DC
  Administered 2020-01-20 – 2020-01-21 (×2): 2 g via INTRAVENOUS
  Filled 2020-01-20 (×3): qty 2

## 2020-01-20 MED ORDER — PRISMASOL BGK 0/2.5 32-2.5 MEQ/L IV SOLN
INTRAVENOUS | Status: DC
  Filled 2020-01-20 (×25): qty 5000

## 2020-01-20 MED ORDER — DARBEPOETIN ALFA 60 MCG/0.3ML IJ SOSY: 60.0000 ug | PREFILLED_SYRINGE | INTRAMUSCULAR | Status: DC

## 2020-01-20 MED ORDER — ALTEPLASE 2 MG IJ SOLR
2.0000 mg | Freq: Once | INTRAMUSCULAR | Status: DC | PRN
  Filled 2020-01-20: qty 2

## 2020-01-20 MED ORDER — VITAL HIGH PROTEIN PO LIQD
1000.0000 mL | ORAL | Status: DC
  Administered 2020-01-20 – 2020-01-27 (×9): 1000 mL

## 2020-01-20 NOTE — Progress Notes (Signed)
[facility] Progress Note  Subjective: eyes open this am, on vent, still on pressors  Vitals:   01/20/20 0630 01/20/20 0645 01/20/20 0700 01/20/20 0800  BP: (!) 94/51 100/65 (!) 93/56 113/85  Pulse: (!) 101 94 76 (!) 121  Resp: (!) 28 (!) 26 (!) 27 (!) 30  Temp:   (!) 97.1 F (36.2 C)   TempSrc:   Axillary   SpO2: 95% 95% 96% 94%  Weight:      Height:        Exam: Gen on vent, sedated, ETT in place No rash, cyanosis or gangrene Sclera anicteric  No jvd or bruits Chest clear bilat ant/ lat RRR no MRG Abd soft ntnd no mass or ascites +bs GU w / foley cath no urine in tube MS no joint effusions or deformity Ext no sig LE edema, no wounds or ulcers Neuro as above, not responding LFA AVF+bruit   Home meds:  - metoprolol 25 bid/ midodrine 5 tid  - gabapentin 100 bid  - renvela 3 ac tid/ omperazole 20 qd/ synthorid 75 ug  - warfarin 5-10 mg qd  - prn's/ vitamins/ supplements    OP HD: [facility]   3.5h  2/2.5 bath  97.5kg  No heparin  LFA AVF  - EPO 6600 q Rx   Assessment/ Plan: 1. Cardiac arrest - resuscitated in ED, 6 min CPR approx 2. Sepsis/ shock - suspected due to PNA, on pressors and IV abx per CCM 3. Resp failure - on vent per pmd 4. ESRD - on HD MWF.  Plan CRRT today due to persistent hemodynamic instability, see orders.  5. BP/ volume - in shock on pressors, some edema on exam, up 5kg by wts.   Keep even w/ CRRT, hold UF until pressors are down/ off.  6. Atrial fibrillation 7. Chronic s/d CHF  8. MBD ckd - resume binders when eating 9. Anemia ckd - Hb low 7.7, cont ESA, will consult pharm for EPO equivalent    Sabrina Beck 01/20/2020, 11:11 AM   Recent Labs  Lab 01/12/2020 0515 02/04/2020 0515 01/12/2020 0836 01/20/20 0239 01/20/20 0541  K 4.7   < > 4.4  --  6.2*  BUN 54*  --   --   --  76*  CREATININE 10.02*  --   --   --  10.71*  CALCIUM 10.8*  --   --   --  9.9  PHOS  --   --   --   --  8.0*  HGB 7.9*   < > 7.5* 7.3*   --    < > = values in this interval not displayed.   Inpatient medications:  chlorhexidine gluconate (MEDLINE KIT)  15 mL Mouth Rinse BID   Chlorhexidine Gluconate Cloth  6 each Topical Daily   docusate  100 mg Oral BID   insulin aspart  0-6 Units Subcutaneous Q4H   levothyroxine  75 mcg Per Tube Q0600   mouth rinse  15 mL Mouth Rinse 10 times per day   midodrine  5 mg Per Tube TID   pantoprazole (PROTONIX) IV  40 mg Intravenous QHS   polyethylene glycol  17 g Oral Daily     prismasol BGK 4/2.5     ceFEPime (MAXIPIME) IV     phenylephrine (NEO-SYNEPHRINE) Adult infusion 110 mcg/min (01/20/20 0900)   prismasol BGK 2/2.5 replacement solution     prismasol BGK 4/2.5     alteplase, docusate sodium, fentaNYL (SUBLIMAZE) injection, fentaNYL (SUBLIMAZE)  injection, heparin, midazolam, polyethylene glycol, sodium chloride

## 2020-01-20 NOTE — Progress Notes (Signed)
Nutrition Follow-up  DOCUMENTATION CODES:   Obesity unspecified  INTERVENTION:   Initiate tube feeding via OGT: Vital High Protein at 55 ml/h (1320 ml per day)  Provides 1320 kcal, 116 gm protein, 1104 ml free water daily  Add B-complex with vitamin C while on CRRT  NUTRITION DIAGNOSIS:   Inadequate oral intake related to inability to eat as evidenced by NPO status.  Ongoing   GOAL:   Provide needs based on ASPEN/SCCM guidelines  Progressing  MONITOR:   Vent status, Labs, Weight trends, Skin, I & O's  ASSESSMENT:   79 year old female who presented on 6/14 with respiratory distress and cardiac arrest immediately upon arrival to the ED. Pt required intubation. PMH of ESRD on HD, atrial fibrillation, CHF. Pt did not require TTM.  Discussed patient in ICU rounds and with RN today. OG tube in place. RD to order TF today. CRRT being initiated today. May be able to extubated 6/16.   Patient remains intubated on ventilator support. MV: 13.1 L/min Temp (24hrs), Avg:98.3 F (36.8 C), Min:97.1 F (36.2 C), Max:99.4 F (37.4 C)   Labs reviewed. Potassium 6.2 (H), BUN 76 (H), Creat 10.71 (H), phosphorus 8 (H) CBG: 91-101  Medications reviewed and include colace, novolog, miralax, phenylephrine.  I/O +4.2 L Weight up to 102.1 kg today from 98.9 kg on admission.  Diet Order:   Diet Order            Diet NPO time specified  Diet effective now                 EDUCATION NEEDS:   No education needs have been identified at this time  Skin:  Skin Assessment: Reviewed RN Assessment (MASD to sacrum, back)  Last BM:  01/25/2020  Height:   Ht Readings from Last 1 Encounters:  01/21/2020 5\' 4"  (1.626 m)    Weight:   Wt Readings from Last 1 Encounters:  01/20/20 102.1 kg    Ideal Body Weight:  54.5 kg  BMI:  Body mass index is 38.64 kg/m.  Estimated Nutritional Needs:   Kcal:  5364-6803  Protein:  109-130 grams  Fluid:  1000 ml + UOP    Lucas Mallow,  RD, LDN, CNSC Please refer to Amion for contact information.

## 2020-01-20 NOTE — Progress Notes (Signed)
NAME:  Sabrina Beck, MRN:  [id number], DOB:  06-12-1941, LOS: 1 ADMISSION DATE:  02/02/2020, CONSULTATION DATE: 6/14 REFERRING MD: Dr. Vanita Panda EDP, CHIEF COMPLAINT: Cardiac arrest  Brief History   79 year old female with ESRD on HD presented to [facility] emergency department 6/14 with respiratory distress and suffered cardiac arrest immediately upon arrival.  Brief arrest and responsive after.  Intubated during arrest and transferred to [facility] ED for ICU admission.  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  She has been compliant with dialysis and has reportedly not missed a treatment.  Last treatment 4/11.  In the morning hours of 6/14 when her son who she lives with attempted to wake her up for dialysis she was difficult to arouse and complained of trouble breathing.  He transported her to the emergency department and she became unresponsive as they pulled into the parking lot.  ED staff immediately realized she was pulseless and began resuscitative efforts.  Presenting rhythm was asystole.  The patient underwent what sounds like 2 rounds of CPR including epinephrine, atropine, and hyperkalemia treatment.  There was concern for STEMI based on rhythm change however, EKG was reviewed by cardiology who was not quite as concerned.  The patient was transferred to [facility] emergency department with plans for admission to ICU when bed became available.  In [facility] emergency department the patient was awake on the ventilator and able to respond to nursing.  She was given given sedation for vent comfort.  She was found to be hypotensive and was started on phenylephrine IV infusion. PCCM called for admission.   Past Medical History   has a past  medical history of Anemia, Atrial fibrillation ([place]), Chest pain (08/09/7251), Diastolic heart failure ([place]), Diverticulosis, ESRD on hemodialysis ([place]), GERD (gastroesophageal reflux disease), Headache, Mixed hyperlipidemia, Peripheral neuropathy, PONV (postoperative nausea and vomiting), Systemic hypertension, and Uterine cancer ([place]).   Significant Hospital Events   6/14 admit for cardiac arrest  Consults:  Nephrology  Procedures:  ETT 6/14 > CVL 6/14 >  Significant Diagnostic Tests:  CXR on presentation: R>L infiltrate which is evolving   Micro Data:  Blood 6/14 > Tracheal aspirate 6/14 >  Antimicrobials:  Cefepime 6/14 > Vancomycin 6/14 >  Interim history/subjective:  Increasing vasopressor requirements overnight. Periods of agitation requiring midazolam.   Objective   Blood pressure (!) 93/56, pulse 76, temperature (!) 97.1 F (36.2 C), temperature source Axillary, resp. rate (!) 27, height 5\' 4"  (1.626 m), weight 102.1 kg, SpO2 96 %.    Vent Mode: PRVC FiO2 (%):  [40 %-65 %] 40 % Set Rate:  [24 bmp] 24 bmp Vt Set:  [430 mL] 430 mL PEEP:  [5 cmH20-8 cmH20] 5 cmH20 Pressure Support:  [5 cmH20] 5 cmH20 Plateau Pressure:  [19 cmH20-23 cmH20] 19 cmH20   Intake/Output Summary (Last 24 hours) at 01/20/2020 0850 Last data filed at 01/20/2020 0700 Gross per 24 hour  Intake 3916.27 ml  Output --  Net 3916.27 ml   Filed Weights   01/16/2020 0512 01/20/20 0356  Weight: 98.9 kg 102.1 kg    Examination: General: Obese female on vent HENT: [place]/AT, PERRL, no JVD Lungs: Diminished R side Cardiovascular: Tachy, IRIR, no MRG Abdomen: Soft, non-tender, non-distended Extremities: No acute deformity or ROM limitation  Neuro: Arouses easily and follows commands.  GU: Foley, no urine in collection bag.   Resolved Hospital Problem list     Assessment & Plan:   Acute hypoxemic respiratory failure: History of multiple pneumonias recently and has a R sided infiltrate on CXR.  Likely a component of pulmonary edema as well.  - Full vent support - SBT once tolerating fluid removal.   Septic shock due to pneumonia.   Lactic acidosis -Continue PE to keep MAP>65  Community acquired bacterial pneumonia R lung. - Stopped vancomycin.  - Continue cefepime pending culture results.   Cardiac arrest: PEA/Asystole. Awake and following commands on arrival to ED. Likely a primary respiratory arrest. ROSC with 2 rounds CPR.  - Does not need TTM given good neurological recovery  - Supportive care   ESRD on HD - Plan to start CRRT   Hyperglycemia with no documented history of DM - Hydrate - CBG monitoring and SSI  Atrial fibrillation: on warfarin, metoprolol at home - Hold metoprolol due to shock - Hold warfarin given supra-thearpeutic INR - Trend coags  Best practice:  Diet: NPO Pain/Anxiety/Delirium protocol (if indicated): Precedex infusion, PRN fentanyl VAP protocol (if indicated): Per protocol DVT prophylaxis: Hold. Supratherapeutic INR GI prophylaxis: PPI Glucose control: SSI Mobility: BR Code Status: FULL (has been DNR in the past), son at bedside endorses full code.  Family Communication: Son updated bedside in ED Disposition: ICU  Labs   CBC: Recent Labs  Lab 01/23/2020 0515 01/10/2020 0836 01/20/20 0239  WBC 12.3*  --  20.4*  NEUTROABS 5.1  --   --   HGB 7.9* 7.5* 7.3*  HCT 24.9* 22.0* 23.1*  MCV 127.0*  --  123.5*  PLT 215  --  732    Basic Metabolic Panel: Recent Labs  Lab 01/09/2020 0515 01/17/2020 0836 01/20/20 0541  NA 137 141 142  K 4.7 4.4 6.2*  CL 96*  --  102  CO2 20*  --  18*  GLUCOSE 428*  --  109*  BUN 54*  --  76*  CREATININE 10.02*  --  10.71*  CALCIUM 10.8*  --  9.9  MG  --   --  2.1  PHOS  --   --  8.0*   GFR: Estimated Creatinine Clearance: 4.9 mL/min (A) (by C-G formula based on SCr of 10.71 mg/dL (H)). Recent Labs  Lab 01/30/2020 0515 01/12/2020 1229 01/20/20 0239  PROCALCITON  --  2.21  --   WBC 12.3*  --   20.4*  LATICACIDVEN 8.6* 5.0*  --     Liver Function Tests: Recent Labs  Lab 01/10/2020 0515  AST 55*  ALT 55*  ALKPHOS 77  BILITOT 0.8  PROT 5.7*  ALBUMIN 2.9*   Recent Labs  Lab 01/12/2020 0515  LIPASE 33   No results for input(s): AMMONIA in the last 168 hours.  ABG    Component Value Date/Time   PHART 7.326 (L) 01/20/2020 0836   PCO2ART 40.8 01/23/2020 0836   PO2ART 83 02/03/2020 0836   HCO3 21.3 01/25/2020 0836   TCO2 22 01/09/2020 0836   ACIDBASEDEF 4.0 (H) 01/16/2020 0836   O2SAT 95.0 01/11/2020 0836     Coagulation Profile: Recent Labs  Lab 02/01/2020 0515 01/20/20 0239  INR 5.1* 6.1*    Cardiac Enzymes: No results for input(s): CKTOTAL, CKMB, CKMBINDEX, TROPONINI in the last 168 hours.  HbA1C: Hgb A1c MFr Bld  Date/Time Value Ref Range Status  01/22/2020 12:29 PM 5.3 4.8 - 5.6 % Final  Comment:    (NOTE) Pre diabetes:          5.7%-6.4%  Diabetes:              >6.4%  Glycemic control for   <7.0% adults with diabetes     CBG: Recent Labs  Lab 01/09/2020 1523 01/09/2020 1932 01/27/2020 2323 01/20/20 0328 01/20/20 0756  GLUCAP 95 97 90 91 101*   CRITICAL CARE Performed by: Kipp Brood   Total critical care time: 40 minutes  Critical care time was exclusive of separately billable procedures and treating other patients.  Critical care was necessary to treat or prevent imminent or life-threatening deterioration.  Critical care was time spent personally by me on the following activities: development of treatment plan with patient and/or surrogate as well as nursing, discussions with consultants, evaluation of patient's response to treatment, examination of patient, obtaining history from patient or surrogate, ordering and performing treatments and interventions, ordering and review of laboratory studies, ordering and review of radiographic studies, pulse oximetry, re-evaluation of patient's condition and participation in multidisciplinary  rounds.  Kipp Brood, MD [facility] ICU Physician [place]  Pager: [phone] Mobile: [phone] After hours: [phone].   01/20/2020 8:50 AM

## 2020-01-20 NOTE — Progress Notes (Signed)
PHARMACY NOTE:  ANTIMICROBIAL RENAL DOSAGE ADJUSTMENT  Current antimicrobial regimen includes a mismatch between antimicrobial dosage and estimated renal function.  As per policy approved by the Pharmacy & Therapeutics and Medical Executive Committees, the antimicrobial dosage will be adjusted accordingly.  Current antimicrobial dosage:  Cefepime 1g IV q24h  Indication: PNA  Renal Function: [x]      On CRRT    Antimicrobial dosage has been changed to:  Cefepime 2g IV q12h  Additional comments:  Patient transitioning from ESRD on HD to CRRT today   Arturo Morton, PharmD, BCPS Please check AMION for all [facility] contact numbers Clinical Pharmacist 01/20/2020 9:42 AM

## 2020-01-20 NOTE — Progress Notes (Addendum)
Pharmacy Consult  26 yof presenting with respiratory distress and cardiac arrest. Patient ESRD on HD PTA, receives epoetin alpha 6600 units TIW with HD treatments outpatient.  Pharmacy consulted to convert to formulary Aranesp. Ok to give dose today per discussion with Dr. Jonnie Finner. Hg low but stable at 7.3. No recent Fe panel in chart.   Plan: Transition to Aranesp 31mcg IV qTues per protocol Monitor Nephrology plans, CBC, Fe panel   Arturo Morton, PharmD, BCPS Please check AMION for all [facility] contact numbers Clinical Pharmacist 01/20/2020 11:32 AM

## 2020-01-20 NOTE — Progress Notes (Signed)
NAME:  Sabrina Beck, MRN:  [id number], DOB:  1941-03-28, LOS: 1 ADMISSION DATE:  01/30/2020, CONSULTATION DATE: 6/14 REFERRING MD: Dr. Vanita Panda EDP, CHIEF COMPLAINT: Cardiac arrest  Brief History   79 year old female with ESRD on HD presented to [facility] emergency department 6/14 with respiratory distress and suffered cardiac arrest immediately upon arrival.  Brief arrest and responsive after.  Intubated during arrest and transferred to [facility] ED for ICU admission.  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  She has been compliant with dialysis and has reportedly not missed a treatment.  Last treatment 4/11.  In the morning hours of 6/14 when her son who she lives with attempted to wake her up for dialysis she was difficult to arouse and complained of trouble breathing.  He transported her to the emergency department and she became unresponsive as they pulled into the parking lot.  ED staff immediately realized she was pulseless and began resuscitative efforts.  Presenting rhythm was asystole.  The patient underwent what sounds like 2 rounds of CPR including epinephrine, atropine, and hyperkalemia treatment.  There was concern for STEMI based on rhythm change however, EKG was reviewed by cardiology who was not quite as concerned.  The patient was transferred to [facility] emergency department with plans for admission to ICU when bed became available.  In [facility] emergency department the patient was awake on the ventilator and able to respond to nursing.  She was given given sedation for vent comfort.  She was found to be hypotensive and was started on phenylephrine IV infusion. PCCM called for admission.   Past Medical History   has a past  medical history of Anemia, Atrial fibrillation ([place]), Chest pain (24/58/0998), Diastolic heart failure ([place]), Diverticulosis, ESRD on hemodialysis ([place]), GERD (gastroesophageal reflux disease), Headache, Mixed hyperlipidemia, Peripheral neuropathy, PONV (postoperative nausea and vomiting), Systemic hypertension, and Uterine cancer ([place]).   Significant Hospital Events   6/14 admit for cardiac arrest  Consults:  Nephrology  Procedures:  ETT 6/14 > CVL 6/14 >  Significant Diagnostic Tests:  CXR on presentation: R>L infiltrate   Micro Data:  Blood 6/14 > Tracheal aspirate 6/14 >  Antimicrobials:  Cefepime 6/14 > Vancomycin 6/14 >  Interim history/subjective:  A couple low MAPs overnight requiring increased dose of phenylephrine. Now at 120 mcg/min. Precedex was weaned, and patient is now awake and following commands but anxious.   Patient spontaneously moves all 4 limbs and follows simple, 1-step commands in all extremities.   Central line was placed yesterday afternoon. Blood sugar has normalized. Awaiting culture results. K6.2, BUN 76, WBC 20.4, CO2 18, INR 6.1 today.   Objective   Blood pressure (!) 93/56, pulse 76, temperature (!) 97.1 F (36.2 C), temperature source Axillary, resp. rate (!) 27, height 5\' 4"  (1.626 m), weight 102.1 kg, SpO2 96 %.    Vent Mode: PRVC FiO2 (%):  [40 %-65 %] 40 % Set Rate:  [24 bmp] 24 bmp Vt Set:  [430 mL] 430 mL PEEP:  [5 cmH20-8 cmH20] 5 cmH20 Pressure Support:  [5 cmH20] 5 cmH20 Plateau Pressure:  [19 cmH20-23 cmH20] 19 cmH20   Intake/Output Summary (Last 24 hours) at 01/20/2020 0815 Last data filed at 01/20/2020 0700 Gross per 24 hour  Intake 3916.27 ml  Output --  Net 3916.27 ml   Filed Weights   02/02/2020 0512 01/20/20 0356  Weight: 98.9 kg 102.1 kg    Examination: General: Obese female, awake, anxious appearing on vent HENT: [place]/AT, PERRL, no JVD Lungs: Diminished R Cardiovascular: Tachy, IRIR, no MRG Abdomen: Soft,  non-tender, non-distended Extremities: No acute deformity or ROM limitation Neuro: Awake and following simple commands. Spontaneously moves all 4 extremitise.  GU: Foley, no urine in collection bag.   Resolved Hospital Problem list     Assessment & Plan:   Acute hypoxemic respiratory failure: History of multiple pneumonias recently and has a R sided infiltrate on CXR. Likely a component of pulmonary edema as well.  - Full vent support, wean as tolerated - VAP bundle, daily SBT - Empiric cefepime, stop vanco - Follow cultures - Initiate tube feeds today if unable to extubate  Shock: likely septic Lactic acidosis - Hemodynamic monitoring ICU - Phenylephrine continue, low threshold to switch to levophed - MAP goal 19mmHg - Ensure lactic clearing - Con't cefepime, stop vancomycin  - Blood and respiratory cultures pending   Cardiac arrest: PEA/Asystole. Awake and following commands on arrival to ED. Likely a primary respiratory arrest. ROSC with 2 rounds CPR.  - Admit to ICU - CT head given supra-therapeutic INR - Does not need TTM - Supportive care   ESRD on HD - K 6.9 today, AG- Metabolic acidosis w/ BUN 76. Likely uremia. Nephrology has been consulted to help decide HD vs CRRT starting today. Pt still requiring 120 phenylephrine to maintain MAPs. Central line was placed yesterday. Patient is anuric. - Nephrology following, appreciate recs - Plan for dialysis today - Consider EKG - Follow BMP  Hyperglycemia with no documented history of DM. CBG over last 12 hours has normalized.  - CTM - Hydrate - CBG monitoring and SSI  Atrial fibrillation: on warfarin, metoprolol at home. INR 6.1 today - Hold metoprolol due to shock - Hold warfarin given supra-thearpeutic INR - Trend coags   Best practice:  Diet: NPO Pain/Anxiety/Delirium protocol (if indicated): Precedex infusion, PRN fentanyl VAP protocol (if indicated): Per protocol DVT prophylaxis: Hold. Supratherapeutic INR GI  prophylaxis: PPI Glucose control: SSI Mobility: BR Code Status: FULL  Family Communication:  Disposition: ICU  Labs   CBC: Recent Labs  Lab 01/18/2020 0515 01/15/2020 0836 01/20/20 0239  WBC 12.3*  --  20.4*  NEUTROABS 5.1  --   --   HGB 7.9* 7.5* 7.3*  HCT 24.9* 22.0* 23.1*  MCV 127.0*  --  123.5*  PLT 215  --  128    Basic Metabolic Panel: Recent Labs  Lab 01/28/2020 0515 01/28/2020 0836 01/20/20 0541  NA 137 141 142  K 4.7 4.4 6.2*  CL 96*  --  102  CO2 20*  --  18*  GLUCOSE 428*  --  109*  BUN 54*  --  76*  CREATININE 10.02*  --  10.71*  CALCIUM 10.8*  --  9.9  MG  --   --  2.1  PHOS  --   --  8.0*   GFR: Estimated Creatinine Clearance: 4.9 mL/min (A) (by C-G formula based on SCr of 10.71 mg/dL (H)). Recent Labs  Lab 01/23/2020 0515 02/04/2020 1229 01/20/20 0239  PROCALCITON  --  2.21  --   WBC 12.3*  --  20.4*  LATICACIDVEN 8.6* 5.0*  --     Liver Function Tests: Recent Labs  Lab 01/29/2020 0515  AST 55*  ALT 55*  ALKPHOS 77  BILITOT 0.8  PROT 5.7*  ALBUMIN 2.9*   Recent Labs  Lab 01/10/2020 0515  LIPASE 33   No results for input(s): AMMONIA in the last 168 hours.  ABG    Component Value Date/Time   PHART 7.326 (L) 01/29/2020 0836   PCO2ART 40.8 01/10/2020 0836   PO2ART 83 01/14/2020 0836   HCO3 21.3 01/14/2020 0836   TCO2 22 01/31/2020 0836   ACIDBASEDEF 4.0 (H) 01/18/2020 0836   O2SAT 95.0 02/04/2020 0836     Coagulation Profile: Recent Labs  Lab 01/09/2020 0515 01/20/20 0239  INR 5.1* 6.1*    Cardiac Enzymes: No results for input(s): CKTOTAL, CKMB, CKMBINDEX, TROPONINI in the last 168 hours.  HbA1C: Hgb A1c MFr Bld  Date/Time Value Ref Range Status  01/18/2020 12:29 PM 5.3 4.8 - 5.6 % Final    Comment:    (NOTE) Pre diabetes:          5.7%-6.4%  Diabetes:              >6.4%  Glycemic control for   <7.0% adults with diabetes     CBG: Recent Labs  Lab 01/07/2020 1523 02/02/2020 1932 01/09/2020 2323 01/20/20 0328  01/20/20 0756  GLUCAP 95 97 90 91 101*    Review of Systems:   Patient is encephalopathic and/or intubated. Therefore history has been obtained from chart review.    Past Medical History  She,  has a past medical history of Anemia, Atrial fibrillation ([place]), Chest pain (67/20/9470), Diastolic heart failure ([place]), Diverticulosis, ESRD on hemodialysis ([place]), GERD (gastroesophageal reflux disease), Headache, Mixed hyperlipidemia, Peripheral neuropathy, PONV (postoperative nausea and vomiting), Systemic hypertension, and Uterine cancer ([place]).   Surgical History    Past Surgical History:  Procedure Laterality Date  . ABDOMINAL HYSTERECTOMY    . APPENDECTOMY    . AV FISTULA PLACEMENT Left 11/17/2013   Procedure: CREATION OF LEFT RADIOCEPHALIC ARTERIOVENOUS (AV) FISTULA ;  Surgeon: [name], MD;  Location: [place];  Service: Vascular;  Laterality: Left;  . BREAST SURGERY Left    tumors non canerous 2- 3 different surgeries  . CATARACT EXTRACTION W/PHACO Right 10/26/2016   Procedure: CATARACT EXTRACTION PHACO AND INTRAOCULAR LENS PLACEMENT (IOC) CDE= 11.91;  Surgeon: Tonny Branch, MD;  Location: [facility];  Service: Ophthalmology;  Laterality: Right;  right - pt knows to arrive at 6:30  . CATARACT EXTRACTION W/PHACO Left 11/30/2016   Procedure: CATARACT EXTRACTION PHACO AND INTRAOCULAR LENS PLACEMENT (IOC) CDE - 7.92 ;  Surgeon: Tonny Branch, MD;  Location: [facility];  Service: Ophthalmology;  Laterality: Left;  left  . CHOLECYSTECTOMY    . COLONOSCOPY W/ BIOPSIES AND POLYPECTOMY    . FISTULA SUPERFICIALIZATION Left 05/19/2014   Procedure: FISTULA SUPERFICIALIZATION-LEFT ARM;  Surgeon: [name], MD;  Location: [place];  Service: Vascular;  Laterality: Left;  . INSERTION OF DIALYSIS CATHETER N/A 01/24/2014   Procedure: INSERTION OF DIALYSIS CATHETER;  Surgeon: Rosetta Posner, MD;  Location: [facility];  Service: Vascular;  Laterality: N/A;  . KNEE ARTHROSCOPY    . TUBAL LIGATION       Social  History   reports that she quit smoking about 20 years ago. She has never used smokeless tobacco. She reports that she does not drink alcohol and does not use drugs.   Family History   Her family history includes Cancer in her brother; Diabetes in her brother, father, mother, and sister; Heart attack in her brother and father; Heart disease in her brother and  father; Hyperlipidemia in her sister; Hypertension in her brother, mother, and sister. There is no history of Colon cancer.   Allergies No Known Allergies   Home Medications  Prior to Admission medications   Medication Sig Start Date End Date Taking? Authorizing Provider  acetaminophen (TYLENOL) 500 MG tablet Take 1,000 mg by mouth every 6 (six) hours as needed for moderate pain.     [provider]  albuterol (VENTOLIN HFA) 108 (90 Base) MCG/ACT inhaler Inhale 2 puffs into the lungs every 4 (four) hours as needed for wheezing or shortness of breath (cough, shortness of breath or wheezing.). 04/25/19   Murlean Iba, MD  Cholecalciferol (VITAMIN D) 2000 UNITS CAPS Take 2,000 Units by mouth every evening.     [provider]  fluticasone (FLOVENT HFA) 44 MCG/ACT inhaler Inhale 2 puffs into the lungs 2 (two) times daily. Patient not taking: Reported on 11/25/2019 04/25/19 04/24/20  Murlean Iba, MD  gabapentin (NEURONTIN) 100 MG capsule Take 100 mg by mouth 2 (two) times daily.  09/13/18   [provider]  guaiFENesin (MUCINEX) 600 MG 12 hr tablet Take 1 tablet (600 mg total) by mouth 2 (two) times daily. Patient not taking: Reported on 11/25/2019 09/30/19   Roxan Hockey, MD  levothyroxine (SYNTHROID) 50 MCG tablet Take 50 mcg by mouth daily. 11/22/19   [provider]  metoprolol tartrate (LOPRESSOR) 25 MG tablet Take 1 tablet (25 mg total) by mouth 2 (two) times daily. 09/30/19   Roxan Hockey, MD  midodrine (PROAMATINE) 5 MG tablet Take 1 tablet (5 mg total) by mouth 3 (three) times daily with  meals. 09/30/19   Roxan Hockey, MD  Multiple Vitamins-Minerals (PRESERVISION AREDS 2 PO) Take 1 tablet by mouth 2 (two) times daily.    [provider]  multivitamin (RENA-VIT) TABS tablet Take 1 tablet by mouth every evening.  07/06/18   [provider]  Omega-3 Fatty Acids (OMEGA-3 FISH OIL) 300 MG CAPS Take 300 mg by mouth 2 (two) times daily.    [provider]  pantoprazole (PROTONIX) 40 MG tablet Take 1 tablet (40 mg total) by mouth daily before breakfast. 09/30/19   Denton Brick, [place], MD  Propylene Glycol (SYSTANE BALANCE) 0.6 % SOLN Place 1 drop into both eyes 3 (three) times daily as needed (for burning or dry eyes).     [provider]  RENVELA 800 MG tablet Take 2,400 mg by mouth 3 (three) times daily with meals. Takes 1 tablet with snacks 04/17/15   [provider]  warfarin (COUMADIN) 5 MG tablet TAKE 1 & 1/2 (ONE & ONE-HALF) TABLETS  TO  2  TABLETS  BY MOUTH ONCE  DAILY  AS  DIRECTED  BY  COUMADIN  CLINIC 11/24/19   [facility], Tami Lin, PA     Critical care time:      Lorne Skeens, Medial Student

## 2020-01-21 LAB — APTT: aPTT: 38 seconds — ABNORMAL HIGH (ref 24–36)

## 2020-01-21 LAB — RENAL FUNCTION PANEL
Albumin: 2.7 g/dL — ABNORMAL LOW (ref 3.5–5.0)
Albumin: 2.5 g/dL — ABNORMAL LOW (ref 3.5–5.0)
Anion gap: 20 — ABNORMAL HIGH (ref 5–15)
Anion gap: 13 (ref 5–15)
BUN: 61 mg/dL — ABNORMAL HIGH (ref 8–23)
BUN: 49 mg/dL — ABNORMAL HIGH (ref 8–23)
CO2: 18 mmol/L — ABNORMAL LOW (ref 22–32)
CO2: 24 mmol/L (ref 22–32)
Calcium: 9.7 mg/dL (ref 8.9–10.3)
Calcium: 9.7 mg/dL (ref 8.9–10.3)
Chloride: 103 mmol/L (ref 98–111)
Chloride: 102 mmol/L (ref 98–111)
Creatinine, Ser: 6.52 mg/dL — ABNORMAL HIGH (ref 0.44–1.00)
Creatinine, Ser: 4.48 mg/dL — ABNORMAL HIGH (ref 0.44–1.00)
GFR calc Af Amer: 6 mL/min — ABNORMAL LOW (ref >60)
GFR calc Af Amer: 10 mL/min — ABNORMAL LOW (ref >60)
GFR calc non Af Amer: 6 mL/min — ABNORMAL LOW (ref >60)
GFR calc non Af Amer: 9 mL/min — ABNORMAL LOW (ref >60)
Glucose, Bld: 148 mg/dL — ABNORMAL HIGH (ref 70–99)
Glucose, Bld: 141 mg/dL — ABNORMAL HIGH (ref 70–99)
Phosphorus: 5.8 mg/dL — ABNORMAL HIGH (ref 2.5–4.6)
Phosphorus: 4.2 mg/dL (ref 2.5–4.6)
Potassium: 4.9 mmol/L (ref 3.5–5.1)
Potassium: 4.5 mmol/L (ref 3.5–5.1)
Sodium: 141 mmol/L (ref 135–145)
Sodium: 139 mmol/L (ref 135–145)

## 2020-01-21 LAB — GLUCOSE, CAPILLARY
Glucose-Capillary: 132 mg/dL — ABNORMAL HIGH (ref 70–99)
Glucose-Capillary: 135 mg/dL — ABNORMAL HIGH (ref 70–99)
Glucose-Capillary: 139 mg/dL — ABNORMAL HIGH (ref 70–99)
Glucose-Capillary: 142 mg/dL — ABNORMAL HIGH (ref 70–99)
Glucose-Capillary: 148 mg/dL — ABNORMAL HIGH (ref 70–99)
Glucose-Capillary: 153 mg/dL — ABNORMAL HIGH (ref 70–99)

## 2020-01-21 LAB — IRON AND TIBC
Iron: 217 ug/dL — ABNORMAL HIGH (ref 28–170)
Saturation Ratios: 89 % — ABNORMAL HIGH (ref 10.4–31.8)
TIBC: 244 ug/dL — ABNORMAL LOW (ref 250–450)
UIBC: 27 ug/dL

## 2020-01-21 LAB — CBG MONITORING, ED: Glucose-Capillary: 254 mg/dL — ABNORMAL HIGH (ref 70–99)

## 2020-01-21 LAB — MAGNESIUM: Magnesium: 2.3 mg/dL (ref 1.7–2.4)

## 2020-01-21 MED ORDER — METOPROLOL TARTRATE 25 MG/10 ML ORAL SUSPENSION
25.0000 mg | Freq: Two times a day (BID) | ORAL | Status: DC
  Administered 2020-01-21 – 2020-02-02 (×23): 25 mg
  Filled 2020-01-21 (×23): qty 10

## 2020-01-21 MED ORDER — SODIUM CHLORIDE 0.9 % IV SOLN
2.0000 g | Freq: Two times a day (BID) | INTRAVENOUS | Status: AC
  Administered 2020-01-21 – 2020-01-27 (×12): 2 g via INTRAVENOUS
  Filled 2020-01-21 (×12): qty 2

## 2020-01-21 MED ORDER — POLYETHYLENE GLYCOL 3350 17 G PO PACK: 17.0000 g | PACK | Freq: Every day | ORAL | Status: DC | PRN

## 2020-01-21 MED ORDER — DARBEPOETIN ALFA 60 MCG/0.3ML IJ SOSY
60.0000 ug | PREFILLED_SYRINGE | INTRAMUSCULAR | Status: DC
  Administered 2020-01-21: 60 ug via INTRAVENOUS
  Filled 2020-01-21: qty 0.3

## 2020-01-21 MED ORDER — DOCUSATE SODIUM 50 MG/5ML PO LIQD: 100.0000 mg | Freq: Two times a day (BID) | ORAL | Status: DC | PRN

## 2020-01-21 MED ORDER — DOCUSATE SODIUM 50 MG/5ML PO LIQD
100.0000 mg | Freq: Two times a day (BID) | ORAL | Status: DC
  Administered 2020-01-21 – 2020-01-27 (×7): 100 mg
  Filled 2020-01-21 (×7): qty 10

## 2020-01-21 MED ORDER — POLYETHYLENE GLYCOL 3350 17 G PO PACK
17.0000 g | PACK | Freq: Every day | ORAL | Status: DC
  Administered 2020-01-21 – 2020-01-27 (×5): 17 g
  Filled 2020-01-21 (×5): qty 1

## 2020-01-21 NOTE — Progress Notes (Signed)
Renal Navigator provided update to patient's OP HD clinic based on progress notes.  Alphonzo Cruise, [place] Renal Navigator [phone]

## 2020-01-21 NOTE — Progress Notes (Addendum)
[facility] Progress Note  Subjective: eyes open this am, on vent, pressors off at the moment  Vitals:   01/21/20 1115 01/21/20 1117 01/21/20 1130 01/21/20 1133  BP: 108/61  97/67   Pulse: 100 (!) 112    Resp: (!) 24 (!) 27 (!) 22   Temp:    (!) 96.8 F (36 C)  TempSrc:    Temporal  SpO2: 100% 100%    Weight:      Height:        Exam: Gen on vent, alert No jvd or bruits Chest scattered bilat rhonchit, no wheezing RRR no MRG Abd soft ntnd no mass or ascites +bs Ext trace LE edema Neuro responsive LFA AVF+bruit   Home meds:  - metoprolol 25 bid/ midodrine 5 tid  - gabapentin 100 bid  - renvela 3 ac tid/ omperazole 20 qd/ synthorid 75 ug  - warfarin 5-10 mg qd  - prn's/ vitamins/ supplements    OP HD: [facility]   3.5h  2/2.5 bath  97.5kg  No heparin  LFA AVF  - EPO 6600 q Rx   Assessment/ Plan: 1. Cardiac arrest - resuscitated in ED, 6 min CPR approx 2. Sepsis/ shock - suspected due to PNA. Coming down on pressors, on IV abx 3. Resp failure - on vent per pmd, no plans for intubating today 4. ESRD - on HD MWF.  CRRT started on 6/15 due to shock.  5. BP/ volume - BP's soft, off pressors for now, will try to pull 50 cc/hr net neg 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 Ca 9.7> 10.9 corr, avoid any Ca++/vit D products. P 5.8 7. Anemia ckd - Hb low 7.7, pharm ordered darbe 60 ug to give today then weekly (gets EPO at OP center). Check Fe stores 8. Hyperkalemia - a little better, closer to 5 today, cont 2K pre filter fluids 9. Atrial fibrillation - getting metoprolol here for this 10. Chronic s/d CHF     Rob Avigayil Ton 01/21/2020, 11:42 AM   Recent Labs  Lab 01/29/2020 0836 01/20/20 0239 01/20/20 0541 01/20/20 1607 01/21/20 0410  K 4.4  --    < > 5.9* 4.9  BUN  --   --    < > 85* 61*  CREATININE  --   --    < > 10.31* 6.52*  CALCIUM  --   --    < > 9.7 9.7  PHOS  --   --    < > 7.8* 5.8*  HGB 7.5* 7.3*  --   --   --    < > = values  in this interval not displayed.   Inpatient medications: . B-complex with vitamin C  1 tablet Per Tube Daily  . chlorhexidine gluconate (MEDLINE KIT)  15 mL Mouth Rinse BID  . Chlorhexidine Gluconate Cloth  6 each Topical Daily  . darbepoetin (ARANESP) injection - DIALYSIS  60 mcg Intravenous Q Wed-HD  . docusate  100 mg Per Tube BID  . insulin aspart  0-6 Units Subcutaneous Q4H  . levothyroxine  75 mcg Per Tube Q0600  . mouth rinse  15 mL Mouth Rinse 10 times per day  . metoprolol tartrate  25 mg Per Tube BID  . midodrine  5 mg Per Tube TID  . pantoprazole (PROTONIX) IV  40 mg Intravenous QHS  . polyethylene glycol  17 g Per Tube Daily   .  prismasol BGK 4/2.5 200 mL/hr at 01/20/20 1332  . ceFEPime (  MAXIPIME) IV    . feeding supplement (VITAL HIGH PROTEIN) 55 mL/hr at 01/21/20 0245  . phenylephrine (NEO-SYNEPHRINE) Adult infusion Stopped (01/21/20 0547)  . prismasol BGK 2/2.5 replacement solution 400 mL/hr at 01/21/20 0152  . prismasol BGK 4/2.5 1,800 mL/hr at 01/21/20 0907   alteplase, fentaNYL (SUBLIMAZE) injection, fentaNYL (SUBLIMAZE) injection, heparin, midazolam, polyethylene glycol, sodium chloride       

## 2020-01-21 NOTE — Progress Notes (Signed)
NAME:  Sabrina Beck, MRN:  [id number], DOB:  1940/10/23, LOS: 2 ADMISSION DATE:  01/24/2020, CONSULTATION DATE: 6/14 REFERRING MD: Dr. Vanita Panda EDP, CHIEF COMPLAINT: Cardiac arrest  Brief History   79 year old female with ESRD on HD presented to [facility] emergency department 6/14 with respiratory distress and suffered cardiac arrest immediately upon arrival.  Brief arrest and responsive after.  Intubated during arrest and transferred to [facility] ED for ICU admission.  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  She has been compliant with dialysis and has reportedly not missed a treatment.  Last treatment 4/11.  In the morning hours of 6/14 when her son who she lives with attempted to wake her up for dialysis she was difficult to arouse and complained of trouble breathing.  He transported her to the emergency department and she became unresponsive as they pulled into the parking lot.  ED staff immediately realized she was pulseless and began resuscitative efforts.  Presenting rhythm was asystole.  The patient underwent what sounds like 2 rounds of CPR including epinephrine, atropine, and hyperkalemia treatment.  There was concern for STEMI based on rhythm change however, EKG was reviewed by cardiology who was not quite as concerned.  The patient was transferred to [facility] emergency department with plans for admission to ICU when bed became available.  In [facility] emergency department the patient was awake on the ventilator and able to respond to nursing.  She was given given sedation for vent comfort.  She was found to be hypotensive and was started on phenylephrine IV infusion. PCCM called for admission.   Past Medical History   has a past  medical history of Anemia, Atrial fibrillation ([place]), Chest pain (61/60/7371), Diastolic heart failure ([place]), Diverticulosis, ESRD on hemodialysis ([place]), GERD (gastroesophageal reflux disease), Headache, Mixed hyperlipidemia, Peripheral neuropathy, PONV (postoperative nausea and vomiting), Systemic hypertension, and Uterine cancer ([place]).   Significant Hospital Events   6/14 admit for cardiac arrest  Consults:  Nephrology  Procedures:  ETT 6/14 > CVL 6/14 >  Significant Diagnostic Tests:  CXR on presentation: R>L infiltrate which is evolving   Micro Data:  Blood 6/14 > Tracheal aspirate 6/14 >  Antimicrobials:  Cefepime 6/14 > Vancomycin 6/14 >  Interim history/subjective:   Weaned off vasopressors this morning. Increased HR with AF. Increased secretions.   Objective   Blood pressure 94/64, pulse 75, temperature (!) 96.7 F (35.9 C), temperature source Axillary, resp. rate 18, height 5\' 4"  (1.626 m), weight 102.1 kg, SpO2 100 %.    Vent Mode: PRVC FiO2 (%):  [40 %] 40 % Set Rate:  [24 bmp] 24 bmp Vt Set:  [430 mL] 430 mL PEEP:  [5 cmH20] 5 cmH20 Plateau Pressure:  [18 cmH20-32 cmH20] 22 cmH20   Intake/Output Summary (Last 24 hours) at 01/21/2020 0909 Last data filed at 01/21/2020 0900 Gross per 24 hour  Intake 1357.54 ml  Output 1181 ml  Net 176.54 ml   Filed Weights   01/10/2020 0512 01/20/20 0356 01/21/20 0443  Weight: 98.9 kg 102.1 kg 102.1 kg    Examination: General: Obese female on vent HENT: [place]/AT, PERRL, no JVD Lungs: Diffuse rhonchi.  Cardiovascular: Tachy, IRIR, no MRG Abdomen: Soft, non-tender, non-distended Extremities: No acute deformity or ROM limitation Neuro: Awake and  follows commands.   Resolved Hospital Problem list     Assessment & Plan:   Acute hypoxemic respiratory failure: History of multiple pneumonias recently and has a R sided infiltrate on CXR. Likely a component of pulmonary edema as well.  - Full vent support for now -CPT to  facilitate secretion clearance - SBT once HR better controlled.    Was critically ill due to septic shock due to pneumonia.   - off phenylephrine.  Community acquired bacterial pneumonia R lung. - Stopped vancomycin.  - Anticipate completing 7 days of cefepime.   Cardiac arrest: PEA/Asystole. Awake and following commands on arrival to ED. Likely a primary respiratory arrest. ROSC with 2 rounds CPR.  - Does not need TTM given good neurological recovery  - Supportive care   ESRD on HD - Continue CRRT  - UF rate -50 toay.    Atrial fibrillation: on warfarin, metoprolol at home - Resume metoprolol for rate control - Restart warfarin once extubated.   Best practice:  Diet: NPO Pain/Anxiety/Delirium protocol (if indicated): Precedex infusion, PRN fentanyl VAP protocol (if indicated): Per protocol DVT prophylaxis: Hold. Supratherapeutic INR GI prophylaxis: PPI Glucose control: SSI Mobility: BR Code Status: FULL (has been DNR in the past), son at bedside endorses full code.  Family Communication: Son updated bedside in ED Disposition: ICU  Labs   CBC: Recent Labs  Lab 01/21/2020 0515 01/17/2020 0836 01/20/20 0239  WBC 12.3*  --  20.4*  NEUTROABS 5.1  --   --   HGB 7.9* 7.5* 7.3*  HCT 24.9* 22.0* 23.1*  MCV 127.0*  --  123.5*  PLT 215  --  166    Basic Metabolic Panel: Recent Labs  Lab 01/20/2020 0515 01/07/2020 0836 01/20/20 0541 01/20/20 1607 01/21/20 0410  NA 137 141 142 143 141  K 4.7 4.4 6.2* 5.9* 4.9  CL 96*  --  102 105 103  CO2 20*  --  18* 16* 18*  GLUCOSE 428*  --  109* 102* 148*  BUN 54*  --  76* 85* 61*  CREATININE 10.02*  --  10.71* 10.31* 6.52*  CALCIUM 10.8*  --  9.9 9.7 9.7  MG  --   --  2.1  --  2.3  PHOS  --   --  8.0* 7.8* 5.8*   GFR: Estimated Creatinine Clearance: 8 mL/min (A) (by C-G formula based on SCr of 6.52 mg/dL (H)). Recent Labs  Lab 01/31/2020 0515 01/20/2020 1229 01/20/20 0239  PROCALCITON  --  2.21  --   WBC 12.3*  --  20.4*    LATICACIDVEN 8.6* 5.0*  --     Liver Function Tests: Recent Labs  Lab 01/22/2020 0515 01/20/20 1607 01/21/20 0410  AST 55*  --   --   ALT 55*  --   --   ALKPHOS 77  --   --   BILITOT 0.8  --   --   PROT 5.7*  --   --   ALBUMIN 2.9* 2.8* 2.7*   Recent Labs  Lab 01/27/2020 0515  LIPASE 33   No results for input(s): AMMONIA in the last 168 hours.  ABG    Component Value Date/Time   PHART 7.326 (L) 01/24/2020 0836   PCO2ART 40.8 01/13/2020 0836   PO2ART 83 01/11/2020 0836   HCO3 21.3 01/31/2020 0836   TCO2 22 01/13/2020 0836   ACIDBASEDEF 4.0 (H) 02/01/2020 0836   O2SAT 95.0 01/14/2020 0836     Coagulation Profile: Recent Labs  Lab  01/23/2020 0515 01/20/20 0239  INR 5.1* 6.1*    Cardiac Enzymes: No results for input(s): CKTOTAL, CKMB, CKMBINDEX, TROPONINI in the last 168 hours.  HbA1C: Hgb A1c MFr Bld  Date/Time Value Ref Range Status  01/07/2020 12:29 PM 5.3 4.8 - 5.6 % Final    Comment:    (NOTE) Pre diabetes:          5.7%-6.4%  Diabetes:              >6.4%  Glycemic control for   <7.0% adults with diabetes     CBG: Recent Labs  Lab 01/20/20 1551 01/20/20 1949 01/20/20 2322 01/21/20 0351 01/21/20 0732  GLUCAP 98 118* 124* 135* 148*   CRITICAL CARE Performed by: Kipp Brood   Total critical care time: 40 minutes  Critical care time was exclusive of separately billable procedures and treating other patients.  Critical care was necessary to treat or prevent imminent or life-threatening deterioration.  Critical care was time spent personally by me on the following activities: development of treatment plan with patient and/or surrogate as well as nursing, discussions with consultants, evaluation of patient's response to treatment, examination of patient, obtaining history from patient or surrogate, ordering and performing treatments and interventions, ordering and review of laboratory studies, ordering and review of radiographic studies, pulse  oximetry, re-evaluation of patient's condition and participation in multidisciplinary rounds.  Kipp Brood, MD [facility] ICU Physician [place]  Pager: [phone] Mobile: [phone] After hours: [phone].   01/21/2020 9:09 AM

## 2020-01-22 LAB — CBC WITH DIFFERENTIAL/PLATELET
Abs Immature Granulocytes: 0.2 10*3/uL — ABNORMAL HIGH (ref 0.00–0.07)
Basophils Absolute: 0 10*3/uL (ref 0.0–0.1)
Basophils Relative: 0 %
Eosinophils Absolute: 0.1 10*3/uL (ref 0.0–0.5)
Eosinophils Relative: 1 %
HCT: 21.9 % — ABNORMAL LOW (ref 36.0–46.0)
Hemoglobin: 7 g/dL — ABNORMAL LOW (ref 12.0–15.0)
Immature Granulocytes: 2 %
Lymphocytes Relative: 19 %
Lymphs Abs: 2.5 10*3/uL (ref 0.7–4.0)
MCH: 39.8 pg — ABNORMAL HIGH (ref 26.0–34.0)
MCHC: 32 g/dL (ref 30.0–36.0)
MCV: 124.4 fL — ABNORMAL HIGH (ref 80.0–100.0)
Monocytes Absolute: 1.9 10*3/uL — ABNORMAL HIGH (ref 0.1–1.0)
Monocytes Relative: 15 %
Neutro Abs: 8.3 10*3/uL — ABNORMAL HIGH (ref 1.7–7.7)
Neutrophils Relative %: 63 %
Platelets: 137 10*3/uL — ABNORMAL LOW (ref 150–400)
RBC: 1.76 MIL/uL — ABNORMAL LOW (ref 3.87–5.11)
RDW: 15.9 % — ABNORMAL HIGH (ref 11.5–15.5)
WBC: 13.1 10*3/uL — ABNORMAL HIGH (ref 4.0–10.5)
nRBC: 1.4 % — ABNORMAL HIGH (ref 0.0–0.2)

## 2020-01-22 LAB — RENAL FUNCTION PANEL
Albumin: 2.6 g/dL — ABNORMAL LOW (ref 3.5–5.0)
Albumin: 2.7 g/dL — ABNORMAL LOW (ref 3.5–5.0)
Anion gap: 16 — ABNORMAL HIGH (ref 5–15)
Anion gap: 12 (ref 5–15)
BUN: 39 mg/dL — ABNORMAL HIGH (ref 8–23)
BUN: 33 mg/dL — ABNORMAL HIGH (ref 8–23)
CO2: 22 mmol/L (ref 22–32)
CO2: 24 mmol/L (ref 22–32)
Calcium: 10 mg/dL (ref 8.9–10.3)
Calcium: 9.7 mg/dL (ref 8.9–10.3)
Chloride: 101 mmol/L (ref 98–111)
Chloride: 101 mmol/L (ref 98–111)
Creatinine, Ser: 2.93 mg/dL — ABNORMAL HIGH (ref 0.44–1.00)
Creatinine, Ser: 2.23 mg/dL — ABNORMAL HIGH (ref 0.44–1.00)
GFR calc Af Amer: 17 mL/min — ABNORMAL LOW (ref >60)
GFR calc Af Amer: 23 mL/min — ABNORMAL LOW (ref >60)
GFR calc non Af Amer: 14 mL/min — ABNORMAL LOW (ref >60)
GFR calc non Af Amer: 20 mL/min — ABNORMAL LOW (ref >60)
Glucose, Bld: 142 mg/dL — ABNORMAL HIGH (ref 70–99)
Glucose, Bld: 129 mg/dL — ABNORMAL HIGH (ref 70–99)
Phosphorus: 3.1 mg/dL (ref 2.5–4.6)
Phosphorus: 2.6 mg/dL (ref 2.5–4.6)
Potassium: 4.7 mmol/L (ref 3.5–5.1)
Potassium: 4.5 mmol/L (ref 3.5–5.1)
Sodium: 139 mmol/L (ref 135–145)
Sodium: 137 mmol/L (ref 135–145)

## 2020-01-22 LAB — GLUCOSE, CAPILLARY
Glucose-Capillary: 131 mg/dL — ABNORMAL HIGH (ref 70–99)
Glucose-Capillary: 141 mg/dL — ABNORMAL HIGH (ref 70–99)
Glucose-Capillary: 146 mg/dL — ABNORMAL HIGH (ref 70–99)
Glucose-Capillary: 136 mg/dL — ABNORMAL HIGH (ref 70–99)
Glucose-Capillary: 138 mg/dL — ABNORMAL HIGH (ref 70–99)
Glucose-Capillary: 129 mg/dL — ABNORMAL HIGH (ref 70–99)

## 2020-01-22 LAB — MAGNESIUM: Magnesium: 2.3 mg/dL (ref 1.7–2.4)

## 2020-01-22 LAB — APTT: aPTT: 190 seconds (ref 24–36)

## 2020-01-22 MED ORDER — SODIUM CHLORIDE 0.9 % IV SOLN
INTRAVENOUS | Status: DC | PRN
  Administered 2020-01-22 – 2020-01-25 (×3): 250 mL via INTRAVENOUS
  Administered 2020-01-28: 500 mL via INTRAVENOUS
  Administered 2020-01-31 – 2020-02-03 (×2): 250 mL via INTRAVENOUS

## 2020-01-22 MED ORDER — MIDODRINE HCL 5 MG PO TABS
10.0000 mg | ORAL_TABLET | Freq: Three times a day (TID) | ORAL | Status: DC
  Administered 2020-01-22 – 2020-02-02 (×33): 10 mg
  Filled 2020-01-22 (×33): qty 2

## 2020-01-22 MED ORDER — DEXMEDETOMIDINE HCL IN NACL 400 MCG/100ML IV SOLN
0.0000 ug/kg/h | INTRAVENOUS | Status: DC
  Administered 2020-01-22 – 2020-01-23 (×2): 0.4 ug/kg/h via INTRAVENOUS
  Filled 2020-01-22 (×2): qty 100

## 2020-01-22 MED ORDER — GUAIFENESIN 100 MG/5ML PO SOLN
15.0000 mL | ORAL | Status: AC
  Administered 2020-01-22 – 2020-01-24 (×12): 300 mg
  Filled 2020-01-22 (×12): qty 15

## 2020-01-22 MED FILL — Medication: Qty: 1 | Status: AC

## 2020-01-22 NOTE — Progress Notes (Signed)
CRITICAL VALUE ALERT  Critical Value:  APTT 190  Date & Time Notied:  01/22/20 @ 0620  Provider Notified: Warren Lacy  Orders Received/Actions taken: TBD

## 2020-01-22 NOTE — Progress Notes (Signed)
[facility] Progress Note  Subjective: I/O - 400 yest, remains on neo gtt.  Lots of secretions, tan/ yellowish per RN.    Vitals:   01/22/20 1015 01/22/20 1030 01/22/20 1045 01/22/20 1100  BP: 106/81 (!) 94/59 (!) 99/52 122/61  Pulse:  99 89 98  Resp: (!) 21 18 19  (!) 23  Temp:      TempSrc:      SpO2:  96% 97% 93%  Weight:      Height:        Exam: Gen on vent, alert No jvd or bruits Chest scattered coarse BS, no wheezing RRR no MRG Abd soft ntnd no mass or ascites +bs Ext trace LE edema Neuro responsive LFA AVF+bruit   Home meds:  - metoprolol 25 bid/ midodrine 5 tid  - gabapentin 100 bid  - renvela 3 ac tid/ omperazole 20 qd/ synthorid 75 ug  - warfarin 5-10 mg qd  - prn's/ vitamins/ supplements    OP HD: [facility]   3.5h  2/2.5 bath  97.5kg  No heparin  LFA AVF  - EPO 6600 q Rx   Assessment/ Plan: 1. Cardiac arrest - resuscitated in ED, 6 min CPR approx 2. Sepsis/ shock - suspected PNA. IV abx per pmd, weaning pressors, midodrine ^'d to 10 tid.  3. Resp failure - on vent, possibly extubating today 4. ESRD - on HD MWF.  CRRT started on 6/15 due to shock. Hopefully can transition to iHD in 24-48 hrs.  5. BP/ volume - up 2-3kg, stable wt's  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 Ca 9.7> 10.9 corr, avoid Ca++/vit D products. P 5.8 7. Anemia ckd - Hb low 7.7 > 7.0, transfuse prn. SP darbe 60ug yest 6/16 and weekly while here. Tsat 89%, no need for IV Fe.  8. Hyperkalemia - resolved, cont 2K pre-filter fluids, all others are 4K 9. Atrial fibrillation - getting metoprolol here for this 10. Chronic s/d CHF     Rob Venie Montesinos 01/22/2020, 11:07 AM   Recent Labs  Lab 01/20/20 0239 01/20/20 0541 01/21/20 1527 01/22/20 0320  K  --    < > 4.5 4.7  BUN  --    < > 49* 39*  CREATININE  --    < > 4.48* 2.93*  CALCIUM  --    < > 9.7 10.0  PHOS  --    < > 4.2 3.1  HGB 7.3*  --   --  7.0*   < > = values in this interval not displayed.    Inpatient medications:  B-complex with vitamin C  1 tablet Per Tube Daily   chlorhexidine gluconate (MEDLINE KIT)  15 mL Mouth Rinse BID   Chlorhexidine Gluconate Cloth  6 each Topical Daily   darbepoetin (ARANESP) injection - DIALYSIS  60 mcg Intravenous Q Wed-HD   docusate  100 mg Per Tube BID   guaiFENesin  15 mL Per Tube Q4H   insulin aspart  0-6 Units Subcutaneous Q4H   levothyroxine  75 mcg Per Tube Q0600   mouth rinse  15 mL Mouth Rinse 10 times per day   metoprolol tartrate  25 mg Per Tube BID   midodrine  10 mg Per Tube TID   pantoprazole (PROTONIX) IV  40 mg Intravenous QHS   polyethylene glycol  17 g Per Tube Daily     prismasol BGK 4/2.5 200 mL/hr at 01/20/20 1332   sodium chloride Stopped (01/22/20 0701)   ceFEPime (MAXIPIME) IV  Stopped (01/22/20 0631)   feeding supplement (VITAL HIGH PROTEIN) 55 mL/hr at 01/21/20 2041   phenylephrine (NEO-SYNEPHRINE) Adult infusion 40 mcg/min (01/22/20 1100)   prismasol BGK 2/2.5 replacement solution 400 mL/hr at 01/22/20 0352   prismasol BGK 4/2.5 1,800 mL/hr at 01/22/20 1104   sodium chloride, alteplase, fentaNYL (SUBLIMAZE) injection, fentaNYL (SUBLIMAZE) injection, heparin, midazolam, polyethylene glycol, sodium chloride

## 2020-01-22 NOTE — Progress Notes (Signed)
NAME:  Sabrina Beck, MRN:  [id number], DOB:  11-27-1940, LOS: 3 ADMISSION DATE:  02/02/2020, CONSULTATION DATE: 6/14 REFERRING MD: Dr. Vanita Panda EDP, CHIEF COMPLAINT: Cardiac arrest  Brief History   79 year old female with ESRD on HD presented to [facility] emergency department 6/14 with respiratory distress and suffered cardiac arrest immediately upon arrival.  Brief arrest and responsive after.  Intubated during arrest and transferred to [facility] ED for ICU admission.  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  She has been compliant with dialysis and has reportedly not missed a treatment.  Last treatment 4/11.  In the morning hours of 6/14 when her son who she lives with attempted to wake her up for dialysis she was difficult to arouse and complained of trouble breathing.  He transported her to the emergency department and she became unresponsive as they pulled into the parking lot.  ED staff immediately realized she was pulseless and began resuscitative efforts.  Presenting rhythm was asystole.  The patient underwent what sounds like 2 rounds of CPR including epinephrine, atropine, and hyperkalemia treatment.  There was concern for STEMI based on rhythm change however, EKG was reviewed by cardiology who was not quite as concerned.  The patient was transferred to [facility] emergency department with plans for admission to ICU when bed became available.  In [facility] emergency department the patient was awake on the ventilator and able to respond to nursing.  She was given given sedation for vent comfort.  She was found to be hypotensive and was started on phenylephrine IV infusion. PCCM called for admission.   Past Medical History   has a past  medical history of Anemia, Atrial fibrillation ([place]), Chest pain (29/92/4268), Diastolic heart failure ([place]), Diverticulosis, ESRD on hemodialysis ([place]), GERD (gastroesophageal reflux disease), Headache, Mixed hyperlipidemia, Peripheral neuropathy, PONV (postoperative nausea and vomiting), Systemic hypertension, and Uterine cancer ([place]).   Significant Hospital Events   6/14 admit for cardiac arrest  Consults:  Nephrology  Procedures:  ETT 6/14 > CVL 6/14 >  Significant Diagnostic Tests:  CXR on presentation: R>L infiltrate which is evolving   Micro Data:  Blood 6/14 > Tracheal aspirate 6/14 >  Antimicrobials:  Cefepime 6/14 > Vancomycin 6/14 >  Interim history/subjective:   Anxious when switched to PSV today.   Objective   Blood pressure (!) 107/54, pulse (!) 103, temperature (!) 97.4 F (36.3 C), temperature source Oral, resp. rate 17, height 5\' 4"  (1.626 m), weight 101.6 kg, SpO2 100 %.    Vent Mode: PRVC FiO2 (%):  [40 %] 40 % Set Rate:  [24 bmp] 24 bmp Vt Set:  [430 mL] 430 mL PEEP:  [5 cmH20] 5 cmH20 Plateau Pressure:  [17 cmH20-25 cmH20] 17 cmH20   Intake/Output Summary (Last 24 hours) at 01/22/2020 0823 Last data filed at 01/22/2020 0800 Gross per 24 hour  Intake 1766.24 ml  Output 2820 ml  Net -1053.76 ml   Filed Weights   01/21/20 0443 01/21/20 2030 01/22/20 0353  Weight: 102.1 kg 101.6 kg 101.6 kg    Examination: General: Obese female on vent HENT: [place]/AT, PERRL, no JVD Lungs: Diffuse rhonchi, R>L Cardiovascular: Tachy, IRIR, no MRG Abdomen: Soft, non-tender, non-distended Extremities: No acute deformity or ROM limitation Neuro: Awake and follows commands.  Resolved Hospital Problem list     Assessment & Plan:   Acute hypoxemic respiratory failure: History of multiple pneumonias recently and has a R sided infiltrate on CXR. Likely a component of pulmonary edema as well.  - Daily SBT - CPT to facilitate secretion clearance, added  guaifenesin. - SBT once HR better controlled.    critically ill due to septic shock due to pneumonia.   - Continue on  Phenylephrine to keep MAP>65 - Increase midodrine  Community acquired bacterial pneumonia R lung. - Stopped vancomycin.  - Anticipate completing 7 days of cefepime.   Cardiac arrest: PEA/Asystole. Awake and following commands on arrival to ED. Likely a primary respiratory arrest. ROSC with 2 rounds CPR.  - Does not need TTM given good neurological recovery  - Supportive care   ESRD on HD - Continue CRRT  - UF rate -50 toay.    Atrial fibrillation: on warfarin, metoprolol at home - Resume metoprolol for rate control - Restart warfarin once extubated.   Best practice:  Diet: NPO - tube feeds.  Pain/Anxiety/Delirium protocol (if indicated):  PRN fentanyl/versed VAP protocol (if indicated): Per protocol DVT prophylaxis: Hold. Supratherapeutic INR GI prophylaxis: PPI Glucose control: SSI Mobility: BR Code Status: FULL (has been DNR in the past), son at bedside endorses full code.  Family Communication: Son updated bedside in ED Disposition: ICU  Labs   CBC: Recent Labs  Lab 01/26/2020 0515 01/18/2020 0836 01/20/20 0239 01/22/20 0320  WBC 12.3*  --  20.4* 13.1*  NEUTROABS 5.1  --   --  8.3*  HGB 7.9* 7.5* 7.3* 7.0*  HCT 24.9* 22.0* 23.1* 21.9*  MCV 127.0*  --  123.5* 124.4*  PLT 215  --  204 137*    Basic Metabolic Panel: Recent Labs  Lab 01/20/20 0541 01/20/20 1607 01/21/20 0410 01/21/20 1527 01/22/20 0320  NA 142 143 141 139 139  K 6.2* 5.9* 4.9 4.5 4.7  CL 102 105 103 102 101  CO2 18* 16* 18* 24 22  GLUCOSE 109* 102* 148* 141* 142*  BUN 76* 85* 61* 49* 39*  CREATININE 10.71* 10.31* 6.52* 4.48* 2.93*  CALCIUM 9.9 9.7 9.7 9.7 10.0  MG 2.1  --  2.3  --  2.3  PHOS 8.0* 7.8* 5.8* 4.2 3.1   GFR: Estimated Creatinine Clearance: 17.8 mL/min (A) (by C-G formula based on SCr of 2.93 mg/dL (H)). Recent Labs  Lab 01/22/2020 0515 01/08/2020 1229  01/20/20 0239 01/22/20 0320  PROCALCITON  --  2.21  --   --   WBC 12.3*  --  20.4* 13.1*  LATICACIDVEN 8.6* 5.0*  --   --     Liver Function Tests: Recent Labs  Lab 01/21/2020 0515 01/20/20 1607 01/21/20 0410 01/21/20 1527 01/22/20 0320  AST 55*  --   --   --   --   ALT 55*  --   --   --   --   ALKPHOS 77  --   --   --   --   BILITOT 0.8  --   --   --   --   PROT 5.7*  --   --   --   --   ALBUMIN 2.9* 2.8* 2.7* 2.5* 2.6*   Recent Labs  Lab 01/31/2020 0515  LIPASE 33   No results for input(s): AMMONIA in the last 168 hours.  ABG    Component Value Date/Time   PHART 7.326 (L) 01/11/2020 0836   PCO2ART 40.8 01/27/2020 4496  PO2ART 83 01/09/2020 0836   HCO3 21.3 02/03/2020 0836   TCO2 22 01/12/2020 0836   ACIDBASEDEF 4.0 (H) 01/23/2020 0836   O2SAT 95.0 01/18/2020 0836     Coagulation Profile: Recent Labs  Lab 01/14/2020 0515 01/20/20 0239  INR 5.1* 6.1*    Cardiac Enzymes: No results for input(s): CKTOTAL, CKMB, CKMBINDEX, TROPONINI in the last 168 hours.  HbA1C: Hgb A1c MFr Bld  Date/Time Value Ref Range Status  01/18/2020 12:29 PM 5.3 4.8 - 5.6 % Final    Comment:    (NOTE) Pre diabetes:          5.7%-6.4%  Diabetes:              >6.4%  Glycemic control for   <7.0% adults with diabetes     CBG: Recent Labs  Lab 01/21/20 1549 01/21/20 1951 01/21/20 2332 01/22/20 0342 01/22/20 0709  GLUCAP 142* 132* 139* 131* 141*   CRITICAL CARE Performed by: Kipp Brood   Total critical care time: 40 minutes  Critical care time was exclusive of separately billable procedures and treating other patients.  Critical care was necessary to treat or prevent imminent or life-threatening deterioration.  Critical care was time spent personally by me on the following activities: development of treatment plan with patient and/or surrogate as well as nursing, discussions with consultants, evaluation of patient's response to treatment, examination of patient,  obtaining history from patient or surrogate, ordering and performing treatments and interventions, ordering and review of laboratory studies, ordering and review of radiographic studies, pulse oximetry, re-evaluation of patient's condition and participation in multidisciplinary rounds.  Kipp Brood, MD [facility] ICU Physician [place]  Pager: [phone] Mobile: [phone] After hours: [phone].   01/22/2020 8:23 AM

## 2020-01-22 NOTE — Progress Notes (Signed)
Pharmacy Antibiotic Note  Sabrina Beck is a 79 y.o. female admitted on 01/29/2020 with sepsis.  Pharmacy has been consulted for cefepime dosing. ESRD on HD MWF PTA, currently continuing on CRRT. Cultures negative to date.  Plan: Cefepime 2g IV q12h - stop date in for 6/22 per CCM Monitor clinical progress, c/s F/u CRRT tolerance, Nephrology plans   Height: 5\' 4"  (162.6 cm) Weight: 101.6 kg (223 lb 15.8 oz) IBW/kg (Calculated) : 54.7  Temp (24hrs), Avg:97.4 F (36.3 C), Min:96.8 F (36 C), Max:97.9 F (36.6 C)  Recent Labs  Lab 01/16/2020 0515 01/11/2020 0515 01/10/2020 1229 01/20/20 0239 01/20/20 0541 01/20/20 1607 01/21/20 0410 01/21/20 1527 01/22/20 0320  WBC 12.3*  --   --  20.4*  --   --   --   --  13.1*  CREATININE 10.02*   < >  --   --  10.71* 10.31* 6.52* 4.48* 2.93*  LATICACIDVEN 8.6*  --  5.0*  --   --   --   --   --   --    < > = values in this interval not displayed.    Estimated Creatinine Clearance: 17.8 mL/min (A) (by C-G formula based on SCr of 2.93 mg/dL (H)).    No Known Allergies   Arturo Morton, PharmD, BCPS Please check AMION for all [facility] contact numbers Clinical Pharmacist 01/22/2020 8:35 AM

## 2020-01-22 NOTE — Progress Notes (Signed)
[place] Progress Note Patient Name: CHANDELLE HARKEY DOB: 21-May-1941 MRN: [id number]   Date of Service  01/22/2020  HPI/Events of Note  Agitation with attempts to grab her ET tube.  eICU Interventions  Low dose Precedex ordered as needed for extreme agitation.        [name] Milburn Freeney 01/22/2020, 8:00 PM

## 2020-01-23 LAB — RENAL FUNCTION PANEL
Albumin: 2.7 g/dL — ABNORMAL LOW (ref 3.5–5.0)
Albumin: 2.6 g/dL — ABNORMAL LOW (ref 3.5–5.0)
Anion gap: 12 (ref 5–15)
Anion gap: 14 (ref 5–15)
BUN: 32 mg/dL — ABNORMAL HIGH (ref 8–23)
BUN: 30 mg/dL — ABNORMAL HIGH (ref 8–23)
CO2: 24 mmol/L (ref 22–32)
CO2: 21 mmol/L — ABNORMAL LOW (ref 22–32)
Calcium: 9.9 mg/dL (ref 8.9–10.3)
Calcium: 9.5 mg/dL (ref 8.9–10.3)
Chloride: 101 mmol/L (ref 98–111)
Chloride: 102 mmol/L (ref 98–111)
Creatinine, Ser: 1.92 mg/dL — ABNORMAL HIGH (ref 0.44–1.00)
Creatinine, Ser: 1.75 mg/dL — ABNORMAL HIGH (ref 0.44–1.00)
GFR calc Af Amer: 28 mL/min — ABNORMAL LOW (ref >60)
GFR calc Af Amer: 31 mL/min — ABNORMAL LOW (ref >60)
GFR calc non Af Amer: 24 mL/min — ABNORMAL LOW (ref >60)
GFR calc non Af Amer: 27 mL/min — ABNORMAL LOW (ref >60)
Glucose, Bld: 141 mg/dL — ABNORMAL HIGH (ref 70–99)
Glucose, Bld: 132 mg/dL — ABNORMAL HIGH (ref 70–99)
Phosphorus: 2.1 mg/dL — ABNORMAL LOW (ref 2.5–4.6)
Phosphorus: 3.2 mg/dL (ref 2.5–4.6)
Potassium: 4.5 mmol/L (ref 3.5–5.1)
Potassium: 5 mmol/L (ref 3.5–5.1)
Sodium: 137 mmol/L (ref 135–145)
Sodium: 137 mmol/L (ref 135–145)

## 2020-01-23 LAB — CBC WITH DIFFERENTIAL/PLATELET
Abs Immature Granulocytes: 0.42 10*3/uL — ABNORMAL HIGH (ref 0.00–0.07)
Basophils Absolute: 0.1 10*3/uL (ref 0.0–0.1)
Basophils Relative: 0 %
Eosinophils Absolute: 0.3 10*3/uL (ref 0.0–0.5)
Eosinophils Relative: 2 %
HCT: 24.1 % — ABNORMAL LOW (ref 36.0–46.0)
Hemoglobin: 7.6 g/dL — ABNORMAL LOW (ref 12.0–15.0)
Immature Granulocytes: 3 %
Lymphocytes Relative: 25 %
Lymphs Abs: 4.2 10*3/uL — ABNORMAL HIGH (ref 0.7–4.0)
MCH: 39 pg — ABNORMAL HIGH (ref 26.0–34.0)
MCHC: 31.5 g/dL (ref 30.0–36.0)
MCV: 123.6 fL — ABNORMAL HIGH (ref 80.0–100.0)
Monocytes Absolute: 2.4 10*3/uL — ABNORMAL HIGH (ref 0.1–1.0)
Monocytes Relative: 15 %
Neutro Abs: 9.2 10*3/uL — ABNORMAL HIGH (ref 1.7–7.7)
Neutrophils Relative %: 55 %
Platelets: 147 10*3/uL — ABNORMAL LOW (ref 150–400)
RBC: 1.95 MIL/uL — ABNORMAL LOW (ref 3.87–5.11)
RDW: 16.4 % — ABNORMAL HIGH (ref 11.5–15.5)
WBC: 16.7 10*3/uL — ABNORMAL HIGH (ref 4.0–10.5)
nRBC: 4.3 % — ABNORMAL HIGH (ref 0.0–0.2)

## 2020-01-23 LAB — APTT: aPTT: 35 seconds (ref 24–36)

## 2020-01-23 LAB — GLUCOSE, CAPILLARY
Glucose-Capillary: 117 mg/dL — ABNORMAL HIGH (ref 70–99)
Glucose-Capillary: 156 mg/dL — ABNORMAL HIGH (ref 70–99)
Glucose-Capillary: 167 mg/dL — ABNORMAL HIGH (ref 70–99)
Glucose-Capillary: 140 mg/dL — ABNORMAL HIGH (ref 70–99)
Glucose-Capillary: 131 mg/dL — ABNORMAL HIGH (ref 70–99)
Glucose-Capillary: 111 mg/dL — ABNORMAL HIGH (ref 70–99)

## 2020-01-23 LAB — MAGNESIUM: Magnesium: 2.5 mg/dL — ABNORMAL HIGH (ref 1.7–2.4)

## 2020-01-23 LAB — PROTIME-INR
INR: 1.3 — ABNORMAL HIGH (ref 0.8–1.2)
Prothrombin Time: 15.6 seconds — ABNORMAL HIGH (ref 11.4–15.2)

## 2020-01-23 MED ORDER — SODIUM PHOSPHATES 45 MMOLE/15ML IV SOLN
20.0000 mmol | Freq: Once | INTRAVENOUS | Status: AC
  Administered 2020-01-23: 20 mmol via INTRAVENOUS
  Filled 2020-01-23: qty 6.67

## 2020-01-23 MED ORDER — SODIUM CHLORIDE 0.9% FLUSH: 10.0000 mL | INTRAVENOUS | Status: DC | PRN

## 2020-01-23 MED ORDER — SODIUM CHLORIDE 0.9% FLUSH
10.0000 mL | Freq: Two times a day (BID) | INTRAVENOUS | Status: DC
  Administered 2020-01-23 – 2020-02-05 (×24): 10 mL
  Administered 2020-02-06: 20 mL
  Administered 2020-02-06: 10 mL
  Administered 2020-02-07: 20 mL

## 2020-01-23 MED ORDER — WARFARIN - PHARMACIST DOSING INPATIENT: Freq: Every day | Status: DC

## 2020-01-23 MED ORDER — ALPRAZOLAM 0.25 MG PO TABS
0.2500 mg | ORAL_TABLET | Freq: Three times a day (TID) | ORAL | Status: DC | PRN
  Administered 2020-01-23 – 2020-01-24 (×2): 0.25 mg via ORAL
  Filled 2020-01-23 (×2): qty 1

## 2020-01-23 MED ORDER — WARFARIN SODIUM 5 MG PO TABS
5.0000 mg | ORAL_TABLET | Freq: Once | ORAL | Status: AC
  Administered 2020-01-23: 5 mg
  Filled 2020-01-23: qty 1

## 2020-01-23 NOTE — Progress Notes (Signed)
RT held pt CPT at this time due to episode of bradycardia when suctioned and pt is currently weaning on the ventilator. RT will continue to monitor.

## 2020-01-23 NOTE — Progress Notes (Signed)
[facility] Progress Note  Subjective: I/O - 600 yest, neo was up to 100 ug/min overnight, now is down to 20, having lots of thick secretions still per rn  Vitals:   01/23/20 0934 01/23/20 0945 01/23/20 0955 01/23/20 1000  BP: (!) 83/64 (!) 50/28 (!) 73/48 (!) 86/50  Pulse: 80 77 87 92  Resp: (!) 22 (!) 25 (!) 26 18  Temp:      TempSrc:      SpO2: 96% 91% 93% 99%  Weight:      Height:        Exam: Gen on vent, sedated  No jvd or bruits Chest scattered coarse BS, no wheezing RRR no MRG Abd soft ntnd no mass or ascites +bs Ext 1+ UE edema, mild hip edema Neuro responsive LFA AVF+bruit   Home meds:  - metoprolol 25 bid/ midodrine 5 tid  - gabapentin 100 bid  - renvela 3 ac tid/ omperazole 20 qd/ synthorid 75 ug  - warfarin 5-10 mg qd  - prn's/ vitamins/ supplements    OP HD: [facility]   3.5h  2/2.5 bath  97.5kg  No heparin  LFA AVF  - EPO 6600 q Rx   Assessment/ Plan: 1. Cardiac arrest - resuscitated in ED, 6 min CPR approx 2. Sepsis/ shock/ PNA. IV abx, still requiring pressor support. Getting midodrine at 10 tid. Hx prior admits for PNA.  3. Acute resp failure - on vent, secretion issues 4. ESRD - on HD MWF.  CRRT started on 6/15 due to shock. Still requiring sig pressor support. Cont CRRT for now.  5. BP/ volume - up 2-3kg, stable wt's and volume. Cont - 50 cc/hr UF.  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 Ca 9.7> 10.9 corr, avoid Ca++/vit D products. P 5.8 7. Anemia ckd - Hb 7.6, transfuse prn. SP darbe 60ug yest 6/16 and weekly while here. Tsat 89%, no need for IV Fe.  8. Hyperkalemia - resolved, cont 2K pre-filter fluids, all others are 4K 9. Atrial fibrillation - getting metoprolol bid low dose 10. Chronic s/d CHF     Rob Karalyn Kadel 01/23/2020, 10:10 AM   Recent Labs  Lab 01/22/20 0320 01/22/20 0320 01/22/20 1557 01/23/20 0612  K 4.7   < > 4.5 4.5  BUN 39*   < > 33* 32*  CREATININE 2.93*   < > 2.23* 1.92*  CALCIUM 10.0    < > 9.7 9.9  PHOS 3.1   < > 2.6 2.1*  HGB 7.0*  --   --  7.6*   < > = values in this interval not displayed.   Inpatient medications: . B-complex with vitamin C  1 tablet Per Tube Daily  . chlorhexidine gluconate (MEDLINE KIT)  15 mL Mouth Rinse BID  . Chlorhexidine Gluconate Cloth  6 each Topical Daily  . darbepoetin (ARANESP) injection - DIALYSIS  60 mcg Intravenous Q Wed-HD  . docusate  100 mg Per Tube BID  . guaiFENesin  15 mL Per Tube Q4H  . insulin aspart  0-6 Units Subcutaneous Q4H  . levothyroxine  75 mcg Per Tube Q0600  . mouth rinse  15 mL Mouth Rinse 10 times per day  . metoprolol tartrate  25 mg Per Tube BID  . midodrine  10 mg Per Tube TID  . pantoprazole (PROTONIX) IV  40 mg Intravenous QHS  . polyethylene glycol  17 g Per Tube Daily  . sodium chloride flush  10-40 mL Intracatheter Q12H   .  prismasol BGK 4/2.5 200 mL/hr at 01/22/20 1615  . sodium chloride 10 mL/hr at 01/23/20 1000  . ceFEPime (MAXIPIME) IV Stopped (01/23/20 0550)  . dexmedetomidine (PRECEDEX) IV infusion 0.4 mcg/kg/hr (01/23/20 1000)  . feeding supplement (VITAL HIGH PROTEIN) 1,000 mL (01/23/20 0530)  . phenylephrine (NEO-SYNEPHRINE) Adult infusion 20 mcg/min (01/23/20 1000)  . prismasol BGK 2/2.5 replacement solution 400 mL/hr at 01/23/20 0525  . prismasol BGK 4/2.5 1,800 mL/hr at 01/23/20 0957   sodium chloride, alteplase, fentaNYL (SUBLIMAZE) injection, fentaNYL (SUBLIMAZE) injection, heparin, midazolam, polyethylene glycol, sodium chloride, sodium chloride flush

## 2020-01-23 NOTE — Progress Notes (Signed)
NAME:  Sabrina Beck, MRN:  [id number], DOB:  1941-03-08, LOS: 4 ADMISSION DATE:  01/11/2020, CONSULTATION DATE: 6/14 REFERRING MD: Dr. Vanita Panda EDP, CHIEF COMPLAINT: Cardiac arrest  Brief History   79 year old female with ESRD on HD presented to [facility] emergency department 6/14 with respiratory distress and suffered cardiac arrest immediately upon arrival.  Brief arrest and responsive after.  Intubated during arrest and transferred to [facility] ED for ICU admission.  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  She has been compliant with dialysis and has reportedly not missed a treatment.  Last treatment 4/11.  In the morning hours of 6/14 when her son who she lives with attempted to wake her up for dialysis she was difficult to arouse and complained of trouble breathing.  He transported her to the emergency department and she became unresponsive as they pulled into the parking lot.  ED staff immediately realized she was pulseless and began resuscitative efforts.  Presenting rhythm was asystole.  The patient underwent what sounds like 2 rounds of CPR including epinephrine, atropine, and hyperkalemia treatment.  There was concern for STEMI based on rhythm change however, EKG was reviewed by cardiology who was not quite as concerned.  The patient was transferred to [facility] emergency department with plans for admission to ICU when bed became available.  In [facility] emergency department the patient was awake on the ventilator and able to respond to nursing.  She was given given sedation for vent comfort.  She was found to be hypotensive and was started on phenylephrine IV infusion. PCCM called for admission.   Past Medical History   has a past  medical history of Anemia, Atrial fibrillation ([place]), Chest pain (13/24/4010), Diastolic heart failure ([place]), Diverticulosis, ESRD on hemodialysis ([place]), GERD (gastroesophageal reflux disease), Headache, Mixed hyperlipidemia, Peripheral neuropathy, PONV (postoperative nausea and vomiting), Systemic hypertension, and Uterine cancer ([place]).   Significant Hospital Events   6/14 admit for cardiac arrest  Consults:  Nephrology  Procedures:  ETT 6/14 > CVL 6/14 >  Significant Diagnostic Tests:  CXR on presentation: R>L infiltrate which is evolving   Micro Data:  Blood 6/14 > Tracheal aspirate 6/14 >  Antimicrobials:  Cefepime 6/14 > Vancomycin 6/14 >  Interim history/subjective:   Awake, did not tolerate SBT today.  Objective   Blood pressure (!) 88/58, pulse 88, temperature (!) 96.9 F (36.1 C), temperature source Axillary, resp. rate (!) 25, height 5\' 4"  (1.626 m), weight 101.8 kg, SpO2 100 %.    Vent Mode: PRVC FiO2 (%):  [40 %] 40 % Set Rate:  [24 bmp] 24 bmp Vt Set:  [430 mL] 430 mL PEEP:  [5 cmH20] 5 cmH20 Pressure Support:  [15 cmH20] 15 cmH20 Plateau Pressure:  [17 cmH20-25 cmH20] 25 cmH20   Intake/Output Summary (Last 24 hours) at 01/23/2020 1144 Last data filed at 01/23/2020 1100 Gross per 24 hour  Intake 2109.69 ml  Output 2931 ml  Net -821.31 ml   Filed Weights   01/21/20 2030 01/22/20 0353 01/23/20 0400  Weight: 101.6 kg 101.6 kg 101.8 kg    Examination: General: Obese female on vent HENT: [place]/AT, PERRL, no JVD Lungs: Diffuse rhonchi, R>L. Increased WOB on PSV 5/5 Cardiovascular: Tachy, IRIR, no MRG Abdomen: Soft, non-tender, non-distended Extremities: No  acute deformity or ROM limitation Neuro: Awake and follows commands.   Resolved Hospital Problem list     Assessment & Plan:   Acute hypoxemic respiratory failure:  History of multiple pneumonias recently and has a R sided infiltrate on CXR. Likely a component of pulmonary edema as well.  -  Daily SBT - CPT to facilitate secretion clearance, added guaifenesin. - SBT once HR better controlled.    critically ill due to septic shock due to pneumonia.   - Continue on  Phenylephrine to keep MAP>65 - Increase midodrine - may not tolerate much fluid removal until off positive pressure ventilation.   Community acquired bacterial pneumonia R lung. - Stopped vancomycin.  - Anticipate completing 7 days of cefepime.   Cardiac arrest: PEA/Asystole. Awake and following commands on arrival to ED. Likely a primary respiratory arrest. ROSC with 2 rounds CPR.  - Does not need TTM given good neurological recovery  - Supportive care   ESRD on HD - Continue CRRT  - UF rate -50 toay.    Atrial fibrillation: on warfarin, metoprolol at home - Resume metoprolol for rate control - Restart warfarin  Best practice:  Diet: NPO - tube feeds.  Pain/Anxiety/Delirium protocol (if indicated):  PRN fentanyl/versed. Bradycardia with Precedex. VAP protocol (if indicated): Per protocol DVT prophylaxis: Hold. Supratherapeutic INR GI prophylaxis: PPI Glucose control: SSI Mobility: BR Code Status: FULL (has been DNR in the past), son at bedside endorses full code.  Family Communication: Son updated bedside in ED Disposition: ICU  Labs   CBC: Recent Labs  Lab 01/28/2020 0515 01/18/2020 0836 01/20/20 0239 01/22/20 0320 01/23/20 0612  WBC 12.3*  --  20.4* 13.1* PENDING  NEUTROABS 5.1  --   --  8.3* PENDING  HGB 7.9* 7.5* 7.3* 7.0* 7.6*  HCT 24.9* 22.0* 23.1* 21.9* 24.1*  MCV 127.0*  --  123.5* 124.4* 123.6*  PLT 215  --  204 137* PENDING    Basic Metabolic Panel: Recent Labs  Lab 01/20/20 0541 01/20/20 1607 01/21/20 0410 01/21/20 1527 01/22/20 0320 01/22/20 1557 01/23/20 0612  NA 142   < > 141 139 139 137 137  K 6.2*   < > 4.9 4.5 4.7 4.5 4.5  CL 102   < > 103 102 101 101 101  CO2 18*   < > 18* 24 22 24 24   GLUCOSE 109*   < > 148* 141* 142* 129* 141*  BUN 76*   < > 61* 49* 39* 33*  32*  CREATININE 10.71*   < > 6.52* 4.48* 2.93* 2.23* 1.92*  CALCIUM 9.9   < > 9.7 9.7 10.0 9.7 9.9  MG 2.1  --  2.3  --  2.3  --  2.5*  PHOS 8.0*   < > 5.8* 4.2 3.1 2.6 2.1*   < > = values in this interval not displayed.   GFR: Estimated Creatinine Clearance: 27.1 mL/min (A) (by C-G formula based on SCr of 1.92 mg/dL (H)). Recent Labs  Lab 01/12/2020 0515 01/29/2020 1229 01/20/20 0239 01/22/20 0320 01/23/20 0612  PROCALCITON  --  2.21  --   --   --   WBC 12.3*  --  20.4* 13.1* PENDING  LATICACIDVEN 8.6* 5.0*  --   --   --     Liver Function Tests: Recent Labs  Lab 01/20/2020 0515 01/20/20 1607 01/21/20 0410 01/21/20 1527 01/22/20 0320 01/22/20 1557 01/23/20 0612  AST 55*  --   --   --   --   --   --  ALT 55*  --   --   --   --   --   --   ALKPHOS 77  --   --   --   --   --   --   BILITOT 0.8  --   --   --   --   --   --   PROT 5.7*  --   --   --   --   --   --   ALBUMIN 2.9*   < > 2.7* 2.5* 2.6* 2.7* 2.7*   < > = values in this interval not displayed.   Recent Labs  Lab 01/18/2020 0515  LIPASE 33   No results for input(s): AMMONIA in the last 168 hours.  ABG    Component Value Date/Time   PHART 7.326 (L) 02/04/2020 0836   PCO2ART 40.8 01/22/2020 0836   PO2ART 83 01/24/2020 0836   HCO3 21.3 02/04/2020 0836   TCO2 22 01/07/2020 0836   ACIDBASEDEF 4.0 (H) 01/30/2020 0836   O2SAT 95.0 01/20/2020 0836     Coagulation Profile: Recent Labs  Lab 01/23/2020 0515 01/20/20 0239 01/23/20 0612  INR 5.1* 6.1* 1.3*    Cardiac Enzymes: No results for input(s): CKTOTAL, CKMB, CKMBINDEX, TROPONINI in the last 168 hours.  HbA1C: Hgb A1c MFr Bld  Date/Time Value Ref Range Status  01/31/2020 12:29 PM 5.3 4.8 - 5.6 % Final    Comment:    (NOTE) Pre diabetes:          5.7%-6.4%  Diabetes:              >6.4%  Glycemic control for   <7.0% adults with diabetes     CBG: Recent Labs  Lab 01/22/20 1933 01/22/20 2325 01/23/20 0326 01/23/20 0721 01/23/20 1119    GLUCAP 138* 129* 117* 156* 167*   CRITICAL CARE Performed by: Kipp Brood   Total critical care time: 40 minutes  Critical care time was exclusive of separately billable procedures and treating other patients.  Critical care was necessary to treat or prevent imminent or life-threatening deterioration.  Critical care was time spent personally by me on the following activities: development of treatment plan with patient and/or surrogate as well as nursing, discussions with consultants, evaluation of patient's response to treatment, examination of patient, obtaining history from patient or surrogate, ordering and performing treatments and interventions, ordering and review of laboratory studies, ordering and review of radiographic studies, pulse oximetry, re-evaluation of patient's condition and participation in multidisciplinary rounds.  Kipp Brood, MD [facility] ICU Physician [place]  Pager: [phone] Mobile: [phone] After hours: [phone].   01/23/2020 11:44 AM

## 2020-01-23 NOTE — Progress Notes (Signed)
[place] for warfarin Indication: atrial fibrillation   Assessment: 23 yof on warfarin PTA for history of afib admitted with respiratory distress with cardiac arrest on arrival to ER with ROSC. Patient currently intubated, on CRRT, and receiving tube feeds. Warfarin was held initially with INR supratherapeutic at 5.1 on admit, now down to 1.3 today. Pharmacy consulted to resume warfarin per tube today. Hg low but stable at 7.6 (on Aranesp), plt down to 137 yesterday - still pending for today. No active bleed issues reported. No major DDI noted.  PTA warfarin dose: 10mg  daily except 5mg  on Friday per med rec and last outpatient anticoag clinic note (last dose 6/13 PTA)  Goal of Therapy:  INR 2-3 Monitor platelets by anticoagulation protocol: Yes   Plan:  Warfarin 5mg  PT x 1 dose this evening Daily INR Monitor CBC, s/sx bleeding  Sabrina Beck, PharmD, BCPS Clinical Pharmacist 01/23/2020 11:16 AM

## 2020-01-23 NOTE — Progress Notes (Signed)
Nutrition Follow-up  DOCUMENTATION CODES:   Obesity unspecified  INTERVENTION:   Continue tube feeding via OGT: Vital High Protein at 55 ml/h (1320 ml per day)  Provides 1320 kcal, 116 gm protein, 1104 ml free water daily  Continue B-complex with vitamin C while on CRRT  NUTRITION DIAGNOSIS:   Inadequate oral intake related to inability to eat as evidenced by NPO status.  Ongoing   GOAL:   Provide needs based on ASPEN/SCCM guidelines  Progressing  MONITOR:   Vent status, Labs, Weight trends, Skin, I & O's  ASSESSMENT:   79 year old female who presented on 6/14 with respiratory distress and cardiac arrest immediately upon arrival to the ED. Pt required intubation. PMH of ESRD on HD, atrial fibrillation, CHF. Pt did not require TTM.  Discussed patient in ICU rounds and with RN today. Attempted to wean this morning.  OG tube in place. Receiving Vital High Protein at 55 ml/h, tolerating well.  CRRT ongoing.  Patient remains intubated on ventilator support. MV: 11.7 L/min Temp (24hrs), Avg:97.4 F (36.3 C), Min:96.9 F (36.1 C), Max:98.1 F (36.7 C)   Labs reviewed. Potassium 4.5 WNL, BUN 32 (H), Creat 1.92 (H), phosphorus 2.1 (L), mag 2.5 (H) CBG: 117-156  Medications reviewed and include novolog, precedex, phenylephrine. Receiving sodium phosphate for phosphorus repletion.   I/O +2.6 L Weight 101.8 kg today; 98.9 kg on admission.  Diet Order:   Diet Order            Diet NPO time specified  Diet effective now                 EDUCATION NEEDS:   No education needs have been identified at this time  Skin:  Skin Assessment: Reviewed RN Assessment (MASD to groin, breast)  Last BM:  6/18 type 7  Height:   Ht Readings from Last 1 Encounters:  01/31/2020 5\' 4"  (1.626 m)    Weight:   Wt Readings from Last 1 Encounters:  01/23/20 101.8 kg    Ideal Body Weight:  54.5 kg  BMI:  Body mass index is 38.52 kg/m.  Estimated Nutritional Needs:    Kcal:  8676-7209  Protein:  109-130 grams  Fluid:  1000 ml + UOP    Lucas Mallow, RD, LDN, CNSC Please refer to Amion for contact information.

## 2020-01-23 NOTE — Progress Notes (Signed)
[place] Progress Note Patient Name: Sabrina Beck DOB: 18-Nov-1940 MRN: [id number]   Date of Service  01/23/2020  HPI/Events of Note  Patient needs an anxiolytic via NG tube as PRN iv Versed and Fentanyl is causing her blood pressure to drop, yet she gets very anxious and needs medication to keep her from getting dyssynchronous on the ventilator.  eICU Interventions  Xanax 0.25 mg via NG tube tid PRN anxiety ordered.        [name] Nymir Ringler 01/23/2020, 8:59 PM

## 2020-01-24 ENCOUNTER — Inpatient Hospital Stay (HOSPITAL_COMMUNITY): Payer: Medicare HMO

## 2020-01-24 LAB — CBC WITH DIFFERENTIAL/PLATELET
Abs Immature Granulocytes: 0.58 10*3/uL — ABNORMAL HIGH (ref 0.00–0.07)
Basophils Absolute: 0.1 10*3/uL (ref 0.0–0.1)
Basophils Relative: 1 %
Eosinophils Absolute: 0.3 10*3/uL (ref 0.0–0.5)
Eosinophils Relative: 2 %
HCT: 23 % — ABNORMAL LOW (ref 36.0–46.0)
Hemoglobin: 7.5 g/dL — ABNORMAL LOW (ref 12.0–15.0)
Immature Granulocytes: 4 %
Lymphocytes Relative: 22 %
Lymphs Abs: 3.1 10*3/uL (ref 0.7–4.0)
MCH: 40.3 pg — ABNORMAL HIGH (ref 26.0–34.0)
MCHC: 32.6 g/dL (ref 30.0–36.0)
MCV: 123.7 fL — ABNORMAL HIGH (ref 80.0–100.0)
Monocytes Absolute: 2.6 10*3/uL — ABNORMAL HIGH (ref 0.1–1.0)
Monocytes Relative: 18 %
Neutro Abs: 7.8 10*3/uL — ABNORMAL HIGH (ref 1.7–7.7)
Neutrophils Relative %: 53 %
Platelets: 140 10*3/uL — ABNORMAL LOW (ref 150–400)
RBC: 1.86 MIL/uL — ABNORMAL LOW (ref 3.87–5.11)
RDW: 16.3 % — ABNORMAL HIGH (ref 11.5–15.5)
WBC: 14.4 10*3/uL — ABNORMAL HIGH (ref 4.0–10.5)
nRBC: 8.1 % — ABNORMAL HIGH (ref 0.0–0.2)

## 2020-01-24 LAB — RENAL FUNCTION PANEL
Albumin: 2.7 g/dL — ABNORMAL LOW (ref 3.5–5.0)
Albumin: 2.5 g/dL — ABNORMAL LOW (ref 3.5–5.0)
Anion gap: 9 (ref 5–15)
Anion gap: 11 (ref 5–15)
BUN: 28 mg/dL — ABNORMAL HIGH (ref 8–23)
BUN: 29 mg/dL — ABNORMAL HIGH (ref 8–23)
CO2: 25 mmol/L (ref 22–32)
CO2: 24 mmol/L (ref 22–32)
Calcium: 9.7 mg/dL (ref 8.9–10.3)
Calcium: 9.7 mg/dL (ref 8.9–10.3)
Chloride: 100 mmol/L (ref 98–111)
Chloride: 99 mmol/L (ref 98–111)
Creatinine, Ser: 1.57 mg/dL — ABNORMAL HIGH (ref 0.44–1.00)
Creatinine, Ser: 1.77 mg/dL — ABNORMAL HIGH (ref 0.44–1.00)
GFR calc Af Amer: 36 mL/min — ABNORMAL LOW (ref >60)
GFR calc Af Amer: 31 mL/min — ABNORMAL LOW (ref >60)
GFR calc non Af Amer: 31 mL/min — ABNORMAL LOW (ref >60)
GFR calc non Af Amer: 27 mL/min — ABNORMAL LOW (ref >60)
Glucose, Bld: 140 mg/dL — ABNORMAL HIGH (ref 70–99)
Glucose, Bld: 174 mg/dL — ABNORMAL HIGH (ref 70–99)
Phosphorus: 2.6 mg/dL (ref 2.5–4.6)
Phosphorus: 3 mg/dL (ref 2.5–4.6)
Potassium: 4.6 mmol/L (ref 3.5–5.1)
Potassium: 5.4 mmol/L — ABNORMAL HIGH (ref 3.5–5.1)
Sodium: 134 mmol/L — ABNORMAL LOW (ref 135–145)
Sodium: 134 mmol/L — ABNORMAL LOW (ref 135–145)

## 2020-01-24 LAB — GLUCOSE, CAPILLARY
Glucose-Capillary: 123 mg/dL — ABNORMAL HIGH (ref 70–99)
Glucose-Capillary: 140 mg/dL — ABNORMAL HIGH (ref 70–99)
Glucose-Capillary: 182 mg/dL — ABNORMAL HIGH (ref 70–99)
Glucose-Capillary: 142 mg/dL — ABNORMAL HIGH (ref 70–99)
Glucose-Capillary: 179 mg/dL — ABNORMAL HIGH (ref 70–99)

## 2020-01-24 LAB — CULTURE, BLOOD (ROUTINE X 2)
Culture: NO GROWTH
Culture: NO GROWTH

## 2020-01-24 LAB — PROTIME-INR
INR: 1.3 — ABNORMAL HIGH (ref 0.8–1.2)
Prothrombin Time: 15.4 seconds — ABNORMAL HIGH (ref 11.4–15.2)

## 2020-01-24 LAB — APTT: aPTT: 36 seconds (ref 24–36)

## 2020-01-24 LAB — MAGNESIUM: Magnesium: 2.5 mg/dL — ABNORMAL HIGH (ref 1.7–2.4)

## 2020-01-24 MED ORDER — HYDROMORPHONE HCL 1 MG/ML IJ SOLN: 1.0000 mg | INTRAMUSCULAR | Status: DC | PRN

## 2020-01-24 MED ORDER — DEXAMETHASONE SODIUM PHOSPHATE 4 MG/ML IJ SOLN
4.0000 mg | Freq: Four times a day (QID) | INTRAMUSCULAR | Status: AC
  Administered 2020-01-24 – 2020-01-25 (×4): 4 mg via INTRAVENOUS
  Filled 2020-01-24 (×4): qty 1

## 2020-01-24 MED ORDER — IPRATROPIUM-ALBUTEROL 0.5-2.5 (3) MG/3ML IN SOLN
RESPIRATORY_TRACT | Status: AC
  Filled 2020-01-24: qty 3

## 2020-01-24 MED ORDER — FENTANYL BOLUS VIA INFUSION
25.0000 ug | INTRAVENOUS | Status: DC | PRN
  Administered 2020-01-30: 25 ug via INTRAVENOUS
  Filled 2020-01-24: qty 25

## 2020-01-24 MED ORDER — QUETIAPINE FUMARATE 50 MG PO TABS
50.0000 mg | ORAL_TABLET | Freq: Two times a day (BID) | ORAL | Status: DC
  Administered 2020-01-24 – 2020-02-01 (×18): 50 mg
  Filled 2020-01-24 (×18): qty 1

## 2020-01-24 MED ORDER — MIDAZOLAM HCL 2 MG/2ML IJ SOLN: 2.0000 mg | Freq: Once | INTRAMUSCULAR | Status: AC

## 2020-01-24 MED ORDER — MIDAZOLAM HCL 2 MG/2ML IJ SOLN
INTRAMUSCULAR | Status: AC
  Administered 2020-01-24: 2 mg via INTRAVENOUS
  Filled 2020-01-24: qty 2

## 2020-01-24 MED ORDER — WARFARIN SODIUM 10 MG PO TABS
10.0000 mg | ORAL_TABLET | Freq: Once | ORAL | Status: AC
  Administered 2020-01-24: 10 mg
  Filled 2020-01-24: qty 1

## 2020-01-24 MED ORDER — FENTANYL 2500MCG IN NS 250ML (10MCG/ML) PREMIX INFUSION
25.0000 ug/h | INTRAVENOUS | Status: DC
  Administered 2020-01-24: 75 ug/h via INTRAVENOUS
  Administered 2020-01-26: 50 ug/h via INTRAVENOUS
  Administered 2020-01-28: 25 ug/h via INTRAVENOUS
  Administered 2020-01-30: 75 ug/h via INTRAVENOUS
  Administered 2020-01-31: 100 ug/h via INTRAVENOUS
  Administered 2020-02-01: 125 ug/h via INTRAVENOUS
  Filled 2020-01-24 (×6): qty 250

## 2020-01-24 MED ORDER — FENTANYL CITRATE (PF) 100 MCG/2ML IJ SOLN: 100.0000 ug | Freq: Once | INTRAMUSCULAR | Status: AC

## 2020-01-24 MED ORDER — IPRATROPIUM-ALBUTEROL 0.5-2.5 (3) MG/3ML IN SOLN
3.0000 mL | Freq: Four times a day (QID) | RESPIRATORY_TRACT | Status: DC
  Administered 2020-01-24 – 2020-01-28 (×15): 3 mL via RESPIRATORY_TRACT
  Filled 2020-01-24 (×15): qty 3

## 2020-01-24 MED ORDER — ETOMIDATE 2 MG/ML IV SOLN
10.0000 mg | Freq: Once | INTRAVENOUS | Status: AC
  Administered 2020-01-24: 10 mg via INTRAVENOUS

## 2020-01-24 MED ORDER — FENTANYL CITRATE (PF) 100 MCG/2ML IJ SOLN
INTRAMUSCULAR | Status: AC
  Administered 2020-01-24: 100 ug via INTRAVENOUS
  Filled 2020-01-24: qty 2

## 2020-01-24 MED ORDER — ROCURONIUM BROMIDE 50 MG/5ML IV SOLN
60.0000 mg | Freq: Once | INTRAVENOUS | Status: AC
  Administered 2020-01-24: 60 mg via INTRAVENOUS
  Filled 2020-01-24: qty 6

## 2020-01-24 NOTE — Procedures (Signed)
Intubation Procedure Note DERRIANA OSER [id number] [birth year]  Procedure: Intubation Indications: Airway protection and maintenance  Procedure Details Consent: Risks of procedure as well as the alternatives and risks of each were explained to the (patient/caregiver).  Consent for procedure obtained. and Unable to obtain consent because of altered level of consciousness. Time Out: Verified patient identification, verified procedure, site/side was marked, verified correct patient position, special equipment/implants available, medications/allergies/relevent history reviewed, required imaging and test results available.  Performed  Maximum sterile technique was used including gloves, hand hygiene and mask.  MAC and 3    Evaluation Hemodynamic Status: BP stable throughout; O2 sats: stable throughout Patient's Current Condition: stable Complications: No apparent complications Patient did tolerate procedure well. Chest X-ray ordered to verify placement.  CXR: pending.   Mcneil Sober 01/24/2020

## 2020-01-24 NOTE — Progress Notes (Signed)
[facility] Progress Note  Subjective: I/O - 790 cc yest, extubated this am w/ resp distress so was reintubated   Vitals:   01/24/20 1145 01/24/20 1200 01/24/20 1215 01/24/20 1230  BP: (!) 79/61 97/63 (!) 89/50 (!) 89/46  Pulse: (!) 105 86  94  Resp: 20 (!) 23 (!) 24 (!) 24  Temp:      TempSrc:      SpO2: 100% 100%  99%  Weight:      Height:        Exam: Gen on vent, somewhat alert in and out No jvd or bruits Chest scattered coarse BS, no wheezing RRR no MRG Abd soft ntnd no mass or ascites +bs Ext 1+ UE edema, mild hip edema Neuro responsive LFA AVF+bruit   Home meds:  - metoprolol 25 bid/ midodrine 5 tid  - gabapentin 100 bid  - renvela 3 ac tid/ omperazole 20 qd/ synthorid 75 ug  - warfarin 5-10 mg qd  - prn's/ vitamins/ supplements  CXR 6/19 > Stable cardiomegaly with suggestion of mild vascular congestion... Interval improvement hazy opacification over the right mid to lower lung and stable hazy opacification over the left base/retrocardiac region. Likely left pleural effusion unchanged.   OP HD: [facility]   3.5h  2/2.5 bath  97.5kg  No heparin  LFA AVF  - EPO 6600 q Rx   Assessment/ Plan: 1. Cardiac arrest - resuscitated in ED, 6 min CPR approx 2. Sepsis/ shock/ PNA - getting IV abx, still requiring pressor support. Getting midodrine at 10 tid. Hx prior admits for PNA.  3. Acute resp failure - on vent, secretion issues 4. ESRD - on HD MWF.  CRRT started on 6/15 due to shock. Still requiring sig pressor support. Cont CRRT for now.  5. 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 Ca up slightly. P 5.8 7. Anemia ckd - Hb 7.6, transfuse prn. SP darbe 60ug 6/16 and weekly while here. Tsat 89%, no need for IV Fe.  8. Hyperkalemia - resolved, cont 2K pre-filter fluids, all others are 4K 9. Atrial fibrillation - getting metoprolol bid low  dose 10. Chronic s/d CHF     Rob Deontay Ladnier 01/24/2020, 12:39 PM   Recent Labs  Lab 01/23/20 0612 01/23/20 0612 01/23/20 1600 01/24/20 0453  K 4.5   < > 5.0 4.6  BUN 32*   < > 30* 28*  CREATININE 1.92*   < > 1.75* 1.57*  CALCIUM 9.9   < > 9.5 9.7  PHOS 2.1*   < > 3.2 2.6  HGB 7.6*  --   --  7.5*   < > = values in this interval not displayed.   Inpatient medications: . B-complex with vitamin C  1 tablet Per Tube Daily  . chlorhexidine gluconate (MEDLINE KIT)  15 mL Mouth Rinse BID  . Chlorhexidine Gluconate Cloth  6 each Topical Daily  . darbepoetin (ARANESP) injection - DIALYSIS  60 mcg Intravenous Q Wed-HD  . docusate  100 mg Per Tube BID  . insulin aspart  0-6 Units Subcutaneous Q4H  . levothyroxine  75 mcg Per Tube Q0600  . mouth rinse  15 mL Mouth Rinse 10 times per day  . metoprolol tartrate  25 mg Per Tube BID  . midodrine  10 mg Per Tube TID  . pantoprazole (PROTONIX) IV  40 mg Intravenous QHS  .  polyethylene glycol  17 g Per Tube Daily  . QUEtiapine  50 mg Per Tube BID  . sodium chloride flush  10-40 mL Intracatheter Q12H  . warfarin  10 mg Per Tube ONCE-1600  . Warfarin - Pharmacist Dosing Inpatient   Does not apply q1600   .  prismasol BGK 4/2.5 200 mL/hr at 01/23/20 1724  . sodium chloride Stopped (01/24/20 0604)  . ceFEPime (MAXIPIME) IV Stopped (01/24/20 0545)  . feeding supplement (VITAL HIGH PROTEIN) 1,000 mL (01/24/20 0200)  . fentaNYL infusion INTRAVENOUS 150 mcg/hr (01/24/20 1200)  . phenylephrine (NEO-SYNEPHRINE) Adult infusion 40 mcg/min (01/24/20 1200)  . prismasol BGK 2/2.5 replacement solution 400 mL/hr at 01/24/20 0557  . prismasol BGK 4/2.5 1,800 mL/hr at 01/24/20 1104   sodium chloride, alteplase, fentaNYL, heparin, midazolam, polyethylene glycol, sodium chloride, sodium chloride flush

## 2020-01-24 NOTE — Progress Notes (Signed)
NAME:  Sabrina Beck, MRN:  [id number], DOB:  Mar 07, 1941, LOS: 5 ADMISSION DATE:  01/15/2020, CONSULTATION DATE: 6/14 REFERRING MD: Dr. Vanita Panda EDP, CHIEF COMPLAINT: Cardiac arrest  Brief History   79 year old female with ESRD on HD presented to [facility] emergency department 6/14 with respiratory distress and suffered cardiac arrest immediately upon arrival.  Brief arrest and responsive after.  Intubated during arrest and transferred to [facility] ED for ICU admission.  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  She has been compliant with dialysis and has reportedly not missed a treatment.  Last treatment 4/11.  In the morning hours of 6/14 when her son who she lives with attempted to wake her up for dialysis she was difficult to arouse and complained of trouble breathing.  He transported her to the emergency department and she became unresponsive as they pulled into the parking lot.  ED staff immediately realized she was pulseless and began resuscitative efforts.  Presenting rhythm was asystole.  The patient underwent what sounds like 2 rounds of CPR including epinephrine, atropine, and hyperkalemia treatment.  There was concern for STEMI based on rhythm change however, EKG was reviewed by cardiology who was not quite as concerned.  The patient was transferred to [facility] emergency department with plans for admission to ICU when bed became available.  In [facility] emergency department the patient was awake on the ventilator and able to respond to nursing.  She was given given sedation for vent comfort.  She was found to be hypotensive and was started on phenylephrine IV infusion. PCCM called for admission.   Past Medical History   has a past  medical history of Anemia, Atrial fibrillation ([place]), Chest pain (86/57/8469), Diastolic heart failure ([place]), Diverticulosis, ESRD on hemodialysis ([place]), GERD (gastroesophageal reflux disease), Headache, Mixed hyperlipidemia, Peripheral neuropathy, PONV (postoperative nausea and vomiting), Systemic hypertension, and Uterine cancer ([place]).   Significant Hospital Events   6/14 admit for cardiac arrest  Consults:  Nephrology  Procedures:  ETT 6/14 > CVL 6/14 >  Significant Diagnostic Tests:  CXR on presentation: R>L infiltrate which is evolving   Micro Data:  Blood 6/14 > Tracheal aspirate 6/14 >  Antimicrobials:  Cefepime 6/14 > Vancomycin 6/14 >  Interim history/subjective:   More awake.  Pulled tube part way out today.  Reinserted by RT.  More agitated requiring restraints.  Objective   Blood pressure (!) 89/46, pulse 94, temperature 98 F (36.7 C), temperature source Axillary, resp. rate (!) 24, height 5\' 4"  (1.626 m), weight 98.9 kg, SpO2 99 %.    Vent Mode: PRVC FiO2 (%):  [40 %-60 %] 40 % Set Rate:  [24 bmp] 24 bmp Vt Set:  [430 mL] 430 mL PEEP:  [5 cmH20] 5 cmH20 Plateau Pressure:  [20 cmH20-28 cmH20] 26 cmH20   Intake/Output Summary (Last 24 hours) at 01/24/2020 1317 Last data filed at 01/24/2020 1200 Gross per 24 hour  Intake 1979.59 ml  Output 2826 ml  Net -846.41 ml   Filed Weights   01/22/20 0353 01/23/20 0400 01/24/20 0400  Weight: 101.6 kg 101.8 kg 98.9 kg    Examination: General: Obese female on vent HENT: [place]/AT, PERRL, no JVD Lungs: Diffuse rhonchi, R>L. Increased WOB on PSV 5/5. Cardiovascular: Katrine Coho, no MRG Abdomen:  Soft, non-tender, non-distended Extremities: No acute deformity or ROM limitation Neuro: Awake and follows commands.   Resolved Hospital Problem list     Assessment & Plan:   Acute hypoxemic respiratory failure:  History of multiple pneumonias recently and has a R sided infiltrate on CXR. Likely a component of pulmonary  edema as well.  Attempted extubation to high flow nasal cannula today given increasing agitation and minimal ventilator requirements. Promptly failed.  Cords and arytenoid swollen on reintubation. -Resume full ventilatory support. -Short course of dexamethasone. -Resume daily SBT's tomorrow. -Continue chest physiotherapy. -Add bronchodilators.  Critically ill due to septic shock due to pneumonia.   Blood pressure was elevated once extubated.  Dropped again on reintubation. - Continue on  Phenylephrine to keep MAP>65 - Increase midodrine - may not tolerate much fluid removal until off positive pressure ventilation.   Community acquired bacterial pneumonia R lung. - Stopped vancomycin.  - Anticipate completing 7 days of cefepime.   Cardiac arrest: PEA/Asystole. Awake and following commands on arrival to ED. Likely a primary respiratory arrest. ROSC with 2 rounds CPR.  - Does not need TTM given good neurological recovery  - Supportive care   ESRD on HD - Continue CRRT  -Attempt to remove fluid as chest x-ray consistent with volume overload.  Atrial fibrillation: on warfarin, metoprolol at home - Resume metoprolol for rate control - Restart warfarin  Best practice:  Diet: NPO - tube feeds.  Pain/Anxiety/Delirium protocol (if indicated): Fentanyl infusion, started enteral quetiapine VAP protocol (if indicated): Bundle in place DVT prophylaxis: On warfarin for atrial fibrillation GI prophylaxis: PPI Glucose control: SSI Mobility: BR Code Status: FULL (has been DNR in the past), son at bedside endorses full code.  Family Communication: Son updated bedside in ED Disposition: ICU  Labs   CBC: Recent Labs  Lab 01/10/2020 0515 01/10/2020 0515 01/31/2020 2841 01/20/20 0239 01/22/20 0320 01/23/20 0612 01/24/20 0453  WBC 12.3*  --   --  20.4* 13.1* 16.7* 14.4*  NEUTROABS 5.1  --   --   --  8.3* 9.2* 7.8*  HGB 7.9*   < > 7.5* 7.3* 7.0* 7.6* 7.5*  HCT 24.9*   < > 22.0* 23.1* 21.9*  24.1* 23.0*  MCV 127.0*  --   --  123.5* 124.4* 123.6* 123.7*  PLT 215  --   --  204 137* 147* 140*   < > = values in this interval not displayed.    Basic Metabolic Panel: Recent Labs  Lab 01/20/20 0541 01/20/20 1607 01/21/20 0410 01/21/20 1527 01/22/20 0320 01/22/20 1557 01/23/20 0612 01/23/20 1600 01/24/20 0453  NA 142   < > 141   < > 139 137 137 137 134*  K 6.2*   < > 4.9   < > 4.7 4.5 4.5 5.0 4.6  CL 102   < > 103   < > 101 101 101 102 100  CO2 18*   < > 18*   < > 22 24 24  21* 25  GLUCOSE 109*   < > 148*   < > 142* 129* 141* 132* 140*  BUN 76*   < > 61*   < > 39* 33* 32* 30* 28*  CREATININE 10.71*   < > 6.52*   < > 2.93* 2.23* 1.92* 1.75* 1.57*  CALCIUM 9.9   < > 9.7   < > 10.0 9.7 9.9 9.5 9.7  MG 2.1  --  2.3  --  2.3  --  2.5*  --  2.5*  PHOS 8.0*   < > 5.8*   < > 3.1 2.6 2.1* 3.2 2.6   < > = values in this interval not displayed.   GFR: Estimated Creatinine Clearance: 32.7 mL/min (A) (by C-G formula based on SCr of 1.57 mg/dL (H)). Recent Labs  Lab 01/06/2020 0515 02/01/2020 0515 01/29/2020 1229 01/20/20 0239 01/22/20 0320 01/23/20 0612 01/24/20 0453  PROCALCITON  --   --  2.21  --   --   --   --   WBC 12.3*   < >  --  20.4* 13.1* 16.7* 14.4*  LATICACIDVEN 8.6*  --  5.0*  --   --   --   --    < > = values in this interval not displayed.    Liver Function Tests: Recent Labs  Lab 01/16/2020 0515 01/20/20 1607 01/22/20 0320 01/22/20 1557 01/23/20 0612 01/23/20 1600 01/24/20 0453  AST 55*  --   --   --   --   --   --   ALT 55*  --   --   --   --   --   --   ALKPHOS 77  --   --   --   --   --   --   BILITOT 0.8  --   --   --   --   --   --   PROT 5.7*  --   --   --   --   --   --   ALBUMIN 2.9*   < > 2.6* 2.7* 2.7* 2.6* 2.7*   < > = values in this interval not displayed.   Recent Labs  Lab 01/13/2020 0515  LIPASE 33   No results for input(s): AMMONIA in the last 168 hours.  ABG    Component Value Date/Time   PHART 7.326 (L) 01/21/2020 0836    PCO2ART 40.8 01/28/2020 0836   PO2ART 83 02/04/2020 0836   HCO3 21.3 01/09/2020 0836   TCO2 22 01/13/2020 0836   ACIDBASEDEF 4.0 (H) 01/16/2020 0836   O2SAT 95.0 01/16/2020 0836     Coagulation Profile: Recent Labs  Lab 01/07/2020 0515 01/20/20 0239 01/23/20 0612 01/24/20 0453  INR 5.1* 6.1* 1.3* 1.3*    Cardiac Enzymes: No results for input(s): CKTOTAL, CKMB, CKMBINDEX, TROPONINI in the last 168 hours.  HbA1C: Hgb A1c MFr Bld  Date/Time Value Ref Range Status  01/18/2020 12:29 PM 5.3 4.8 - 5.6 % Final    Comment:    (NOTE) Pre diabetes:          5.7%-6.4%  Diabetes:              >6.4%  Glycemic control for   <7.0% adults with diabetes     CBG: Recent Labs  Lab 01/23/20 1933 01/23/20 2313 01/24/20 0313 01/24/20 0710 01/24/20 1133  GLUCAP 131* 111* 123* 140* 182*   CRITICAL CARE Performed by: Kipp Brood   Total critical care time: 40 minutes  Critical care time was exclusive of separately billable procedures and treating other patients.  Critical care was necessary to treat or prevent imminent or life-threatening deterioration.  Critical care was time spent personally by me on the following activities: development of treatment plan with patient and/or surrogate as well as nursing, discussions with consultants, evaluation of patient's response to treatment, examination of patient, obtaining history from patient or surrogate, ordering and performing treatments and interventions, ordering and review of laboratory studies, ordering and review of radiographic studies, pulse oximetry, re-evaluation  of patient's condition and participation in multidisciplinary rounds.  Kipp Brood, MD [facility] ICU Physician [place]  Pager: [phone] Mobile: [phone] After hours: [phone].   01/24/2020 1:17 PM

## 2020-01-24 NOTE — Procedures (Signed)
Intubation Procedure Note SONNET RIZOR [id number] 1941/04/06  Procedure: Intubation Indications: Respiratory insufficiency  Procedure Details Consent: Unable to obtain consent because of emergent medical necessity. Time Out: Verified patient identification, verified procedure, site/side was marked, verified correct patient position, special equipment/implants available, medications/allergies/relevent history reviewed, required imaging and test results available.  Performed  Preoxygenated with high flow nasal cannula. Premedication fentanyl 100 mcg Versed 2 mg Induction medications etomidate 10 mg, paralytic rocuronium 50 mg.  Intubated via video laryngoscopy using MAC 3 blade.  Grade 1 view.  Cords swollen.  First-pass intubation easy airway.  Evaluation Hemodynamic Status: BP stable throughout; O2 sats: stable throughout Patient's Current Condition: stable Complications: No apparent complications Patient did tolerate procedure well. Chest X-ray ordered to verify placement.  CXR: tube position acceptable.   Kipp Brood 01/24/2020  Intubation Procedure Note HAELIE CLAPP [id number] February 10, 1941  Procedure: Intubation Indications: Respiratory insufficiency  Procedure Details Consent: Unable to obtain consent because of emergent medical necessity. Time Out: Verified patient identification, verified procedure, site/side was marked, verified correct patient position, special equipment/implants available, medications/allergies/relevent history reviewed, required imaging and test results available.  Performed  Intubated via video laryngoscopy MAC 3 blade.  Grade 1 view.  Cords swollen.  Single pass.  Easy intubation.  Evaluation Hemodynamic Status: BP stable throughout; O2 sats: stable throughout Patient's Current Condition: stable Complications: No apparent complications Patient did tolerate procedure well. Chest X-ray ordered to verify placement.  CXR: tube position  acceptable.   Einar Grad Roniel Halloran 01/24/2020

## 2020-01-24 NOTE — Progress Notes (Signed)
[place] for warfarin Indication: atrial fibrillation   Assessment: 28 yof on warfarin PTA for history of afib admitted with respiratory distress with cardiac arrest on arrival to ER with ROSC. Patient currently intubated, on CRRT, and receiving tube feeds. Warfarin was held initially with INR supratherapeutic at 5.1 on admit, now down to 1.3. Pharmacy consulted to resume warfarin per tube today. Hg low but stable at 7.5 (on Aranesp), plt 140. No active bleed issues reported. No major DDI noted.  PTA warfarin dose: 10mg  daily except 5mg  on Friday per med rec and last outpatient anticoag clinic note (last dose 6/13 PTA)  Goal of Therapy:  INR 2-3 Monitor platelets by anticoagulation protocol: Yes   Plan:  Warfarin 10mg  PT x 1 dose this evening Daily INR Monitor CBC, s/sx bleeding  Nicoletta Dress, PharmD PGY2 Infectious Disease Pharmacy Resident  01/24/2020 8:19 AM

## 2020-01-25 ENCOUNTER — Other Ambulatory Visit: Payer: Self-pay

## 2020-01-25 ENCOUNTER — Encounter (HOSPITAL_COMMUNITY): Payer: Self-pay | Admitting: Pulmonary Disease

## 2020-01-25 ENCOUNTER — Inpatient Hospital Stay (HOSPITAL_COMMUNITY): Payer: Medicare HMO

## 2020-01-25 LAB — RENAL FUNCTION PANEL
Albumin: 2.5 g/dL — ABNORMAL LOW (ref 3.5–5.0)
Albumin: 2.7 g/dL — ABNORMAL LOW (ref 3.5–5.0)
Anion gap: 11 (ref 5–15)
Anion gap: 9 (ref 5–15)
BUN: 30 mg/dL — ABNORMAL HIGH (ref 8–23)
BUN: 33 mg/dL — ABNORMAL HIGH (ref 8–23)
CO2: 24 mmol/L (ref 22–32)
CO2: 27 mmol/L (ref 22–32)
Calcium: 9.8 mg/dL (ref 8.9–10.3)
Calcium: 10.3 mg/dL (ref 8.9–10.3)
Chloride: 101 mmol/L (ref 98–111)
Chloride: 101 mmol/L (ref 98–111)
Creatinine, Ser: 1.61 mg/dL — ABNORMAL HIGH (ref 0.44–1.00)
Creatinine, Ser: 1.51 mg/dL — ABNORMAL HIGH (ref 0.44–1.00)
GFR calc Af Amer: 35 mL/min — ABNORMAL LOW (ref >60)
GFR calc Af Amer: 37 mL/min — ABNORMAL LOW (ref >60)
GFR calc non Af Amer: 30 mL/min — ABNORMAL LOW (ref >60)
GFR calc non Af Amer: 32 mL/min — ABNORMAL LOW (ref >60)
Glucose, Bld: 190 mg/dL — ABNORMAL HIGH (ref 70–99)
Glucose, Bld: 183 mg/dL — ABNORMAL HIGH (ref 70–99)
Phosphorus: 3 mg/dL (ref 2.5–4.6)
Phosphorus: 3.4 mg/dL (ref 2.5–4.6)
Potassium: 5.3 mmol/L — ABNORMAL HIGH (ref 3.5–5.1)
Potassium: 4.6 mmol/L (ref 3.5–5.1)
Sodium: 136 mmol/L (ref 135–145)
Sodium: 137 mmol/L (ref 135–145)

## 2020-01-25 LAB — CBC WITH DIFFERENTIAL/PLATELET
Abs Immature Granulocytes: 1.48 10*3/uL — ABNORMAL HIGH (ref 0.00–0.07)
Basophils Absolute: 0.2 10*3/uL — ABNORMAL HIGH (ref 0.0–0.1)
Basophils Relative: 1 %
Eosinophils Absolute: 0.2 10*3/uL (ref 0.0–0.5)
Eosinophils Relative: 1 %
HCT: 24.1 % — ABNORMAL LOW (ref 36.0–46.0)
Hemoglobin: 7.8 g/dL — ABNORMAL LOW (ref 12.0–15.0)
Immature Granulocytes: 6 %
Lymphocytes Relative: 22 %
Lymphs Abs: 5.1 10*3/uL — ABNORMAL HIGH (ref 0.7–4.0)
MCH: 39.6 pg — ABNORMAL HIGH (ref 26.0–34.0)
MCHC: 32.4 g/dL (ref 30.0–36.0)
MCV: 122.3 fL — ABNORMAL HIGH (ref 80.0–100.0)
Monocytes Absolute: 4.5 10*3/uL — ABNORMAL HIGH (ref 0.1–1.0)
Monocytes Relative: 19 %
Neutro Abs: 12.2 10*3/uL — ABNORMAL HIGH (ref 1.7–7.7)
Neutrophils Relative %: 51 %
Platelets: 142 10*3/uL — ABNORMAL LOW (ref 150–400)
RBC: 1.97 MIL/uL — ABNORMAL LOW (ref 3.87–5.11)
RDW: 17.1 % — ABNORMAL HIGH (ref 11.5–15.5)
WBC: 23.7 10*3/uL — ABNORMAL HIGH (ref 4.0–10.5)
nRBC: 7 % — ABNORMAL HIGH (ref 0.0–0.2)

## 2020-01-25 LAB — GLUCOSE, CAPILLARY
Glucose-Capillary: 155 mg/dL — ABNORMAL HIGH (ref 70–99)
Glucose-Capillary: 173 mg/dL — ABNORMAL HIGH (ref 70–99)
Glucose-Capillary: 184 mg/dL — ABNORMAL HIGH (ref 70–99)
Glucose-Capillary: 186 mg/dL — ABNORMAL HIGH (ref 70–99)
Glucose-Capillary: 187 mg/dL — ABNORMAL HIGH (ref 70–99)
Glucose-Capillary: 191 mg/dL — ABNORMAL HIGH (ref 70–99)

## 2020-01-25 LAB — APTT: aPTT: 32 seconds (ref 24–36)

## 2020-01-25 LAB — MAGNESIUM: Magnesium: 2.5 mg/dL — ABNORMAL HIGH (ref 1.7–2.4)

## 2020-01-25 LAB — BRAIN NATRIURETIC PEPTIDE: B Natriuretic Peptide: 250.3 pg/mL — ABNORMAL HIGH (ref 0.0–100.0)

## 2020-01-25 LAB — PROTIME-INR
INR: 1.2 (ref 0.8–1.2)
Prothrombin Time: 15 seconds (ref 11.4–15.2)

## 2020-01-25 MED ORDER — CHLORHEXIDINE GLUCONATE CLOTH 2 % EX PADS
6.0000 | MEDICATED_PAD | Freq: Every day | CUTANEOUS | Status: DC
  Administered 2020-01-25 – 2020-02-05 (×11): 6 via TOPICAL

## 2020-01-25 MED ORDER — WARFARIN SODIUM 10 MG PO TABS
10.0000 mg | ORAL_TABLET | Freq: Once | ORAL | Status: AC
  Administered 2020-01-25: 10 mg
  Filled 2020-01-25: qty 1

## 2020-01-25 NOTE — Progress Notes (Signed)
NAME:  Sabrina Beck, MRN:  [id number], DOB:  1941-07-28, LOS: 6 ADMISSION DATE:  01/15/2020, CONSULTATION DATE: 6/14 REFERRING MD: Dr. Vanita Panda EDP, CHIEF COMPLAINT: Cardiac arrest  Brief History   79 year old female with ESRD on HD presented to [facility] emergency department 6/14 with respiratory distress and suffered cardiac arrest immediately upon arrival.  Brief arrest and responsive after.  Intubated during arrest and transferred to [facility] ED for ICU admission.  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  She has been compliant with dialysis and has reportedly not missed a treatment.  Last treatment 4/11.  In the morning hours of 6/14 when her son who she lives with attempted to wake her up for dialysis she was difficult to arouse and complained of trouble breathing.  He transported her to the emergency department and she became unresponsive as they pulled into the parking lot.  ED staff immediately realized she was pulseless and began resuscitative efforts.  Presenting rhythm was asystole.  The patient underwent what sounds like 2 rounds of CPR including epinephrine, atropine, and hyperkalemia treatment.  There was concern for STEMI based on rhythm change however, EKG was reviewed by cardiology who was not quite as concerned.  The patient was transferred to [facility] emergency department with plans for admission to ICU when bed became available.  In [facility] emergency department the patient was awake on the ventilator and able to respond to nursing.  She was given given sedation for vent comfort.  She was found to be hypotensive and was started on phenylephrine IV infusion. PCCM called for admission.   Past Medical History   has a past  medical history of Anemia, Atrial fibrillation ([place]), Chest pain (13/03/6577), Diastolic heart failure ([place]), Diverticulosis, ESRD on hemodialysis ([place]), GERD (gastroesophageal reflux disease), Headache, Mixed hyperlipidemia, Peripheral neuropathy, PONV (postoperative nausea and vomiting), Systemic hypertension, and Uterine cancer ([place]).   Significant Hospital Events   6/14 admit for cardiac arrest  Consults:  Nephrology  Procedures:  ETT 6/14 > CVL 6/14 >  Significant Diagnostic Tests:  CXR on presentation: R>L infiltrate which is evolving   Micro Data:  Blood 6/14 > Tracheal aspirate 6/14 >  Antimicrobials:  Cefepime 6/14 > Vancomycin 6/14 >  Interim history/subjective:   Failed attempted extubation yesterday.  Reintubated without incident.  Overall blood pressure looks better and we are able to pull fluid.  Objective   Blood pressure 111/83, pulse 89, temperature 98.4 F (36.9 C), temperature source Axillary, resp. rate (!) 22, height 5\' 4"  (1.626 m), weight 99.1 kg, SpO2 100 %.    Vent Mode: PRVC FiO2 (%):  [40 %] 40 % Set Rate:  [24 bmp] 24 bmp Vt Set:  [430 mL] 430 mL PEEP:  [5 cmH20] 5 cmH20 Plateau Pressure:  [22 cmH20-28 cmH20] 22 cmH20   Intake/Output Summary (Last 24 hours) at 01/25/2020 1312 Last data filed at 01/25/2020 1300 Gross per 24 hour  Intake 2423.04 ml  Output 3915 ml  Net -1491.96 ml   Filed Weights   01/23/20 0400 01/24/20 0400 01/25/20 0400  Weight: 101.8 kg 98.9 kg 99.1 kg    Examination: General: Obese female on vent HENT: [place]/AT, PERRL, no JVD Lungs: Diffuse rhonchi much improved.  Secretions are now minimal.Cardiovascular: Tachy, IRIR, no MRG  Abdomen: Soft, non-tender, non-distended Extremities: No acute deformity or ROM limitation Neuro: Awake and follows commands.   Resolved Hospital Problem list     Assessment & Plan:   Acute hypoxemic respiratory failure:  History of multiple pneumonias recently and has a R sided  infiltrate on CXR. Likely a component of pulmonary edema as well.  Attempted extubation to high flow nasal cannula today given increasing agitation and minimal ventilator requirements. Promptly failed.  Cords and arytenoid swollen on reintubation. -Resume full ventilatory support. -Short course of dexamethasone. -Resume daily SBT's tomorrow. -Continue chest physiotherapy. -Add bronchodilators.  Critically ill due to septic shock due to pneumonia.   Blood pressure was elevated once extubated.  Dropped again on reintubation. - Continue on  Phenylephrine to keep MAP>65 - Increase midodrine - may not tolerate much fluid removal until off positive pressure ventilation.   Community acquired bacterial pneumonia R lung. - Stopped vancomycin.  - Anticipate completing 7 days of cefepime.   Cardiac arrest: PEA/Asystole. Awake and following commands on arrival to ED. Likely a primary respiratory arrest. ROSC with 2 rounds CPR.  - Does not need TTM given good neurological recovery  - Supportive care   ESRD on HD - Continue CRRT  -Attempt to remove fluid as chest x-ray consistent with volume overload.  Atrial fibrillation: on warfarin, metoprolol at home - Resume metoprolol for rate control - Restart warfarin  Best practice:  Diet: NPO - tube feeds.  Pain/Anxiety/Delirium protocol (if indicated): Fentanyl infusion, started enteral quetiapine VAP protocol (if indicated): Bundle in place DVT prophylaxis: On warfarin for atrial fibrillation GI prophylaxis: PPI Glucose control: SSI Mobility: BR Code Status: FULL (has been DNR in the past), son at bedside endorses full code.  Family Communication: Son updated bedside in ED Disposition: ICU  Labs   CBC: Recent Labs  Lab 01/16/2020 0515 02/03/2020 0836 01/20/20 0239 01/22/20 0320 01/23/20 0612 01/24/20 0453 01/25/20 0231  WBC 12.3*   < > 20.4* 13.1* 16.7* 14.4* 23.7*  NEUTROABS 5.1  --   --  8.3* 9.2* 7.8* 12.2*  HGB 7.9*   < > 7.3*  7.0* 7.6* 7.5* 7.8*  HCT 24.9*   < > 23.1* 21.9* 24.1* 23.0* 24.1*  MCV 127.0*   < > 123.5* 124.4* 123.6* 123.7* 122.3*  PLT 215   < > 204 137* 147* 140* 142*   < > = values in this interval not displayed.    Basic Metabolic Panel: Recent Labs  Lab 01/21/20 0410 01/21/20 1527 01/22/20 0320 01/22/20 1557 01/23/20 0612 01/23/20 1600 01/24/20 0453 01/24/20 1759 01/25/20 0231  NA 141   < > 139   < > 137 137 134* 134* 136  K 4.9   < > 4.7   < > 4.5 5.0 4.6 5.4* 5.3*  CL 103   < > 101   < > 101 102 100 99 101  CO2 18*   < > 22   < > 24 21* 25 24 24   GLUCOSE 148*   < > 142*   < > 141* 132* 140* 174* 190*  BUN 61*   < > 39*   < > 32* 30* 28* 29* 30*  CREATININE 6.52*   < > 2.93*   < > 1.92* 1.75* 1.57* 1.77* 1.61*  CALCIUM 9.7   < > 10.0   < > 9.9 9.5 9.7 9.7 9.8  MG 2.3  --  2.3  --  2.5*  --  2.5*  --  2.5*  PHOS 5.8*   < >  3.1   < > 2.1* 3.2 2.6 3.0 3.0   < > = values in this interval not displayed.   GFR: Estimated Creatinine Clearance: 31.9 mL/min (A) (by C-G formula based on SCr of 1.61 mg/dL (H)). Recent Labs  Lab 01/18/2020 0515 01/12/2020 1229 01/20/20 0239 01/22/20 0320 01/23/20 0612 01/24/20 0453 01/25/20 0231  PROCALCITON  --  2.21  --   --   --   --   --   WBC 12.3*  --    < > 13.1* 16.7* 14.4* 23.7*  LATICACIDVEN 8.6* 5.0*  --   --   --   --   --    < > = values in this interval not displayed.    Liver Function Tests: Recent Labs  Lab 01/25/2020 0515 01/20/20 1607 01/23/20 0612 01/23/20 1600 01/24/20 0453 01/24/20 1759 01/25/20 0231  AST 55*  --   --   --   --   --   --   ALT 55*  --   --   --   --   --   --   ALKPHOS 77  --   --   --   --   --   --   BILITOT 0.8  --   --   --   --   --   --   PROT 5.7*  --   --   --   --   --   --   ALBUMIN 2.9*   < > 2.7* 2.6* 2.7* 2.5* 2.5*   < > = values in this interval not displayed.   Recent Labs  Lab 01/18/2020 0515  LIPASE 33   No results for input(s): AMMONIA in the last 168 hours.  ABG    Component  Value Date/Time   PHART 7.326 (L) 01/09/2020 0836   PCO2ART 40.8 01/08/2020 0836   PO2ART 83 01/27/2020 0836   HCO3 21.3 01/13/2020 0836   TCO2 22 01/13/2020 0836   ACIDBASEDEF 4.0 (H) 02/01/2020 0836   O2SAT 95.0 01/11/2020 0836     Coagulation Profile: Recent Labs  Lab 01/09/2020 0515 01/20/20 0239 01/23/20 0612 01/24/20 0453 01/25/20 0231  INR 5.1* 6.1* 1.3* 1.3* 1.2    Cardiac Enzymes: No results for input(s): CKTOTAL, CKMB, CKMBINDEX, TROPONINI in the last 168 hours.  HbA1C: Hgb A1c MFr Bld  Date/Time Value Ref Range Status  01/24/2020 12:29 PM 5.3 4.8 - 5.6 % Final    Comment:    (NOTE) Pre diabetes:          5.7%-6.4%  Diabetes:              >6.4%  Glycemic control for   <7.0% adults with diabetes     CBG: Recent Labs  Lab 01/24/20 2018 01/25/20 0015 01/25/20 0341 01/25/20 0729 01/25/20 1137  GLUCAP 179* 191* 187* 186* 184*   CRITICAL CARE Performed by: Kipp Brood   Total critical care time: 40 minutes  Critical care time was exclusive of separately billable procedures and treating other patients.  Critical care was necessary to treat or prevent imminent or life-threatening deterioration.  Critical care was time spent personally by me on the following activities: development of treatment plan with patient and/or surrogate as well as nursing, discussions with consultants, evaluation of patient's response to treatment, examination of patient, obtaining history from patient or surrogate, ordering and performing treatments and interventions, ordering and review of laboratory studies, ordering and review of radiographic studies, pulse oximetry, re-evaluation of patient's condition  and participation in multidisciplinary rounds.  Kipp Brood, MD [facility] ICU Physician [place]  Pager: [phone] Mobile: [phone] After hours: [phone].   01/25/2020 1:12 PM

## 2020-01-25 NOTE — Progress Notes (Signed)
[facility] Progress Note  Subjective: I/O - 790 cc yest, extubated this am w/ resp distress so was reintubated   Vitals:   01/25/20 1551 01/25/20 1600 01/25/20 1607 01/25/20 1615  BP: (!) 77/31 (!) 126/50  (!) 98/55  Pulse: (!) 102 99  (!) 107  Resp: (!) 25 (!) 23  (!) 21  Temp:      TempSrc:      SpO2: 100% 98% 100% 100%  Weight:      Height:        Exam: Gen on vent, arouses and responds appropriately No jvd or bruits Chest occ rhonchi, improved, no wheezing RRR no MRG Abd soft ntnd no mass or ascites +bs Ext 1+ UE edema, trace hip edema Neuro responsive LFA AVF+bruit   Home meds:  - metoprolol 25 bid/ midodrine 5 tid  - gabapentin 100 bid  - renvela 3 ac tid/ omperazole 20 qd/ synthorid 75 ug  - warfarin 5-10 mg qd  - prn's/ vitamins/ supplements  CXR 6/20 >1. Cardiomegaly with persistent edema. 2. Opacity in left base may represent a small effusion with atelectasis. An underlying infiltrate/pneumonia is not excluded.    OP HD: [facility]   3.5h  2/2.5 bath  97.5kg  No heparin  LFA AVF  - EPO 6600 q Rx   Assessment/ Plan: 1. Cardiac arrest - resuscitated in ED, 6 min CPR approx 2. Sepsis/ shock/ PNA - getting IV abx, still requiring pressor support. Getting midodrine at 10 tid. Hx prior admits for PNA.  3. Acute resp failure - on vent,  CXR w persistent edema today 4. ESRD - on HD MWF.  CRRT started on 6/15 due to shock. Continue. Using 2K pre filter fluids, others are 4K.  5. 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 Ca up slightly. P ok 7. Anemia ckd - Hb 7.8, transfuse prn. SP darbe 60ug 6/16 and weekly while here. Tsat 89%, no need for IV Fe.  8. Hyperkalemia - resolved, cont 2K pre-filter fluids, all others are 4K 9. Atrial fibrillation - getting metoprolol 25 bid for rate control 10. Chronic s/d CHF     Rob Adante [place] 01/25/2020, 4:17 PM   Recent Labs  Lab  01/24/20 0453 01/24/20 0453 01/24/20 1759 01/25/20 0231  K 4.6   < > 5.4* 5.3*  BUN 28*   < > 29* 30*  CREATININE 1.57*   < > 1.77* 1.61*  CALCIUM 9.7   < > 9.7 9.8  PHOS 2.6   < > 3.0 3.0  HGB 7.5*  --   --  7.8*   < > = values in this interval not displayed.   Inpatient medications: . B-complex with vitamin C  1 tablet Per Tube Daily  . chlorhexidine gluconate (MEDLINE KIT)  15 mL Mouth Rinse BID  . Chlorhexidine Gluconate Cloth  6 each Topical Daily  . darbepoetin (ARANESP) injection - DIALYSIS  60 mcg Intravenous Q Wed-HD  . docusate  100 mg Per Tube BID  . insulin aspart  0-6 Units Subcutaneous Q4H  . ipratropium-albuterol  3 mL Nebulization Q6H  . levothyroxine  75 mcg Per Tube Q0600  . mouth rinse  15 mL Mouth Rinse 10 times per day  . metoprolol tartrate  25 mg Per Tube BID  . midodrine  10 mg Per Tube TID  . pantoprazole (PROTONIX) IV  40 mg Intravenous QHS  . polyethylene glycol  17 g Per Tube Daily  . QUEtiapine  50 mg Per Tube BID  . sodium chloride flush  10-40 mL Intracatheter Q12H  . Warfarin - Pharmacist Dosing Inpatient   Does not apply q1600   .  prismasol BGK 4/2.5 200 mL/hr at 01/24/20 1655  . sodium chloride 10 mL/hr at 01/25/20 1600  . ceFEPime (MAXIPIME) IV Stopped (01/25/20 0554)  . feeding supplement (VITAL HIGH PROTEIN) 1,000 mL (01/24/20 2337)  . fentaNYL infusion INTRAVENOUS 50 mcg/hr (01/25/20 1600)  . phenylephrine (NEO-SYNEPHRINE) Adult infusion 80 mcg/min (01/25/20 1600)  . prismasol BGK 2/2.5 replacement solution 400 mL/hr at 01/25/20 0528  . prismasol BGK 4/2.5 1,800 mL/hr at 01/25/20 1304   sodium chloride, alteplase, fentaNYL, heparin, midazolam, polyethylene glycol, sodium chloride, sodium chloride flush

## 2020-01-25 NOTE — Progress Notes (Signed)
Sabrina Beck for warfarin Indication: atrial fibrillation   Assessment: 10 yof on warfarin PTA for history of afib admitted with respiratory distress with cardiac arrest on arrival to ER with ROSC. Patient currently intubated, on CRRT, and receiving tube feeds. Warfarin was held initially with INR supratherapeutic at 5.1 on admit. Pharmacy consulted to resume warfarin per tube today. No major DDI noted.  PTA warfarin dose: 10mg  daily except 5mg  on Friday per med rec and last outpatient anticoag clinic note (last dose 6/13 PTA)  INR 6/20 down from 1.3 to 1.2 after a dose of 5mg  and then 10mg , will redose at 10mg  again tonight. H/H plts stable no s/sx bleeding noted  Goal of Therapy:  INR 2-3 Monitor platelets by anticoagulation protocol: Yes   Plan:  Warfarin 10mg  PT x 1 dose this evening Daily INR Monitor CBC, s/sx bleeding  Nicoletta Dress, PharmD PGY2 Infectious Disease Pharmacy Resident  01/25/2020 8:10 AM

## 2020-01-25 NOTE — Plan of Care (Signed)
  Problem: Clinical Measurements: Goal: Cardiovascular complication will be avoided Outcome: Progressing Note: Pt is currently in a.fib (baseline) and rate is controlled well with current medication regimen.    Problem: Nutrition: Goal: Adequate nutrition will be maintained Outcome: Progressing Note: Pt is tolerating tube feeds well at goal.   Problem: Elimination: Goal: Will not experience complications related to bowel motility Outcome: Progressing Note: Last BM 6/18.    Problem: Pain Managment: Goal: General experience of comfort will improve Outcome: Progressing   Problem: Clinical Measurements: Goal: Diagnostic test results will improve Outcome: Not Progressing Note: Despite multiple days of IV antibiotics, WBC continues to increase.   Problem: Activity: Goal: Risk for activity intolerance will decrease Outcome: Not Progressing Note: Pt currently on bedrest due to intubation/sedation.   Problem: Elimination: Goal: Will not experience complications related to urinary retention Outcome: Not Applicable Note: Anuric at baseline

## 2020-01-26 DIAGNOSIS — I482 Chronic atrial fibrillation, unspecified: Secondary | ICD-10-CM

## 2020-01-26 DIAGNOSIS — Z7901 Long term (current) use of anticoagulants: Secondary | ICD-10-CM

## 2020-01-26 DIAGNOSIS — Z9911 Dependence on respirator [ventilator] status: Secondary | ICD-10-CM

## 2020-01-26 LAB — RENAL FUNCTION PANEL
Albumin: 2.6 g/dL — ABNORMAL LOW (ref 3.5–5.0)
Albumin: 2.7 g/dL — ABNORMAL LOW (ref 3.5–5.0)
Anion gap: 10 (ref 5–15)
Anion gap: 10 (ref 5–15)
BUN: 33 mg/dL — ABNORMAL HIGH (ref 8–23)
BUN: 36 mg/dL — ABNORMAL HIGH (ref 8–23)
CO2: 25 mmol/L (ref 22–32)
CO2: 27 mmol/L (ref 22–32)
Calcium: 10.3 mg/dL (ref 8.9–10.3)
Calcium: 10.3 mg/dL (ref 8.9–10.3)
Chloride: 102 mmol/L (ref 98–111)
Chloride: 100 mmol/L (ref 98–111)
Creatinine, Ser: 1.66 mg/dL — ABNORMAL HIGH (ref 0.44–1.00)
Creatinine, Ser: 1.75 mg/dL — ABNORMAL HIGH (ref 0.44–1.00)
GFR calc Af Amer: 33 mL/min — ABNORMAL LOW (ref >60)
GFR calc Af Amer: 31 mL/min — ABNORMAL LOW (ref >60)
GFR calc non Af Amer: 29 mL/min — ABNORMAL LOW (ref >60)
GFR calc non Af Amer: 27 mL/min — ABNORMAL LOW (ref >60)
Glucose, Bld: 139 mg/dL — ABNORMAL HIGH (ref 70–99)
Glucose, Bld: 161 mg/dL — ABNORMAL HIGH (ref 70–99)
Phosphorus: 3.2 mg/dL (ref 2.5–4.6)
Phosphorus: 3.5 mg/dL (ref 2.5–4.6)
Potassium: 4.7 mmol/L (ref 3.5–5.1)
Potassium: 4.7 mmol/L (ref 3.5–5.1)
Sodium: 137 mmol/L (ref 135–145)
Sodium: 137 mmol/L (ref 135–145)

## 2020-01-26 LAB — GLUCOSE, CAPILLARY
Glucose-Capillary: 133 mg/dL — ABNORMAL HIGH (ref 70–99)
Glucose-Capillary: 133 mg/dL — ABNORMAL HIGH (ref 70–99)
Glucose-Capillary: 150 mg/dL — ABNORMAL HIGH (ref 70–99)
Glucose-Capillary: 150 mg/dL — ABNORMAL HIGH (ref 70–99)
Glucose-Capillary: 157 mg/dL — ABNORMAL HIGH (ref 70–99)
Glucose-Capillary: 158 mg/dL — ABNORMAL HIGH (ref 70–99)
Glucose-Capillary: 167 mg/dL — ABNORMAL HIGH (ref 70–99)

## 2020-01-26 LAB — CBC WITH DIFFERENTIAL/PLATELET
Abs Immature Granulocytes: 1.09 10*3/uL — ABNORMAL HIGH (ref 0.00–0.07)
Basophils Absolute: 0.1 10*3/uL (ref 0.0–0.1)
Basophils Relative: 0 %
Eosinophils Absolute: 0.1 10*3/uL (ref 0.0–0.5)
Eosinophils Relative: 1 %
HCT: 25.1 % — ABNORMAL LOW (ref 36.0–46.0)
Hemoglobin: 7.8 g/dL — ABNORMAL LOW (ref 12.0–15.0)
Immature Granulocytes: 6 %
Lymphocytes Relative: 20 %
Lymphs Abs: 3.9 10*3/uL (ref 0.7–4.0)
MCH: 39.8 pg — ABNORMAL HIGH (ref 26.0–34.0)
MCHC: 31.1 g/dL (ref 30.0–36.0)
MCV: 128.1 fL — ABNORMAL HIGH (ref 80.0–100.0)
Monocytes Absolute: 4 10*3/uL — ABNORMAL HIGH (ref 0.1–1.0)
Monocytes Relative: 21 %
Neutro Abs: 10.1 10*3/uL — ABNORMAL HIGH (ref 1.7–7.7)
Neutrophils Relative %: 52 %
Platelets: 136 10*3/uL — ABNORMAL LOW (ref 150–400)
RBC: 1.96 MIL/uL — ABNORMAL LOW (ref 3.87–5.11)
RDW: 18.1 % — ABNORMAL HIGH (ref 11.5–15.5)
WBC: 19.3 10*3/uL — ABNORMAL HIGH (ref 4.0–10.5)
nRBC: 5.7 % — ABNORMAL HIGH (ref 0.0–0.2)

## 2020-01-26 LAB — APTT: aPTT: 27 seconds (ref 24–36)

## 2020-01-26 LAB — PROTIME-INR
INR: 1.2 (ref 0.8–1.2)
Prothrombin Time: 14.8 seconds (ref 11.4–15.2)

## 2020-01-26 LAB — PATHOLOGIST SMEAR REVIEW

## 2020-01-26 LAB — MAGNESIUM: Magnesium: 2.6 mg/dL — ABNORMAL HIGH (ref 1.7–2.4)

## 2020-01-26 MED ORDER — WARFARIN SODIUM 7.5 MG PO TABS
15.0000 mg | ORAL_TABLET | Freq: Once | ORAL | Status: AC
  Administered 2020-01-26: 15 mg
  Filled 2020-01-26: qty 2

## 2020-01-26 NOTE — Progress Notes (Signed)
[name] Progress Note  Subjective: I/O -   Removed 2491 with CRRT yesterday- changed filter yesterday per nursing-  BP soft- pressors actually increased   Vitals:   01/26/20 0700 01/26/20 0715 01/26/20 0730 01/26/20 0745  BP: (!) 109/53 114/70 90/69 (!) 106/41  Pulse: (!) 120 (!) 109 (!) 102 (!) 112  Resp: 20 19 (!) 24 (!) 24  Temp:    97.7 F (36.5 C)  TempSrc:    Oral  SpO2: 99% 99% 100% 97%  Weight:      Height:        Exam: Gen on vent, arouses and responds appropriately- currently on vibrating mode No jvd or bruits Chest occ rhonchi, improved, no wheezing RRR no MRG Abd soft ntnd no mass or ascites +bs Ext pitting edema to dep areas Neuro responsive LFA AVF+bruit      OP HD: [facility]   3.5h  2/2.5 bath  97.5kg  No heparin  LFA AVF  - EPO 6600 q Rx   Assessment/ Plan: 1. Cardiac arrest - resuscitated in ED, 6 min CPR approx-  Is awake 2. Sepsis/ shock/ PNA - getting IV abx, still requiring pressor support. Getting midodrine at 10 tid. Hx prior admits for PNA.  3. Acute resp failure - on vent,  CXR w persistent edema today, UF as able-  Had prompt failure of extubation over the weekend 4. ESRD - on HD MWF.  CRRT started on 6/15 due to shock. Continue. Using 2K pre filter fluids, others are 4K.  Need to continue CRRT as req pressors   5. 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 Ca up slightly. P ok 7. Anemia ckd - Hb 7.8, transfuse prn. SP darbe 60ug 6/16 and weekly while here. Tsat 89%, no need for IV Fe. May need to increase ESA 8. Hyperkalemia - resolved, cont 2K pre-filter fluids, all others are 4K 9. Atrial fibrillation - getting metoprolol 25 bid for rate control 10. Chronic s/d CHF     Sabrina Beck Sabrina Beck  01/26/2020, 8:07 AM   Recent Labs  Lab 01/25/20 0231 01/25/20 0231 01/25/20 1549 01/26/20 0331  K 5.3*   < > 4.6 4.7  BUN 30*   <  > 33* 33*  CREATININE 1.61*   < > 1.51* 1.66*  CALCIUM 9.8   < > 10.3 10.3  PHOS 3.0   < > 3.4 3.2  HGB 7.8*  --   --  7.8*   < > = values in this interval not displayed.   Inpatient medications: . B-complex with vitamin C  1 tablet Per Tube Daily  . chlorhexidine gluconate (MEDLINE KIT)  15 mL Mouth Rinse BID  . Chlorhexidine Gluconate Cloth  6 each Topical Daily  . darbepoetin (ARANESP) injection - DIALYSIS  60 mcg Intravenous Q Wed-HD  . docusate  100 mg Per Tube BID  . insulin aspart  0-6 Units Subcutaneous Q4H  . ipratropium-albuterol  3 mL Nebulization Q6H  . levothyroxine  75 mcg Per Tube Q0600  . mouth rinse  15 mL Mouth Rinse 10 times per day  . metoprolol tartrate  25 mg Per Tube BID  . midodrine  10 mg Per Tube TID  . pantoprazole (PROTONIX) IV  40 mg Intravenous QHS  . polyethylene glycol  17 g Per Tube Daily  . QUEtiapine  50 mg Per Tube BID  . sodium chloride flush  10-40 mL Intracatheter Q12H  . Warfarin - Pharmacist Dosing Inpatient   Does not apply q1600   .  prismasol BGK 4/2.5 200 mL/hr at 01/25/20 1824  . sodium chloride 10 mL/hr at 01/26/20 0700  . ceFEPime (MAXIPIME) IV Stopped (01/26/20 0553)  . feeding supplement (VITAL HIGH PROTEIN) 1,000 mL (01/25/20 1708)  . fentaNYL infusion INTRAVENOUS 50 mcg/hr (01/26/20 0700)  . phenylephrine (NEO-SYNEPHRINE) Adult infusion 90 mcg/min (01/26/20 0700)  . prismasol BGK 2/2.5 replacement solution 400 mL/hr at 01/26/20 0650  . prismasol BGK 4/2.5 1,600 mL/hr at 01/26/20 0746   sodium chloride, alteplase, fentaNYL, heparin, midazolam, polyethylene glycol, sodium chloride, sodium chloride flush       

## 2020-01-26 NOTE — Progress Notes (Signed)
[place] for warfarin Indication: atrial fibrillation   Assessment: 24 yof on warfarin PTA for history of afib admitted with respiratory distress with cardiac arrest on arrival to ER with ROSC. Patient currently intubated, on CRRT, and receiving tube feeds. Warfarin was held initially with INR supratherapeutic at 5.1 on admit. Pharmacy consulted to resume warfarin per tube today. No major DDI noted.  PTA warfarin dose: 10mg  daily except 5mg  on Friday per med rec and last outpatient anticoag clinic note (last dose 6/13 PTA)  INR 6/21 remains down at 1.2 after a dose of 5mg , 10 mg and then 10mg , will redose with 15mg  tonight. H/H plts stable no s/sx bleeding noted  Goal of Therapy:  INR 2-3 Monitor platelets by anticoagulation protocol: Yes   Plan:  Warfarin 15 mg po x 1 dose this evening Daily INR Monitor CBC, s/sx bleeding  Alanda Slim, PharmD, [facility] Clinical Pharmacist Please see AMION for all Pharmacists' Contact Phone Numbers 01/26/2020, 8:28 AM

## 2020-01-26 NOTE — Progress Notes (Signed)
NAME:  Sabrina Beck, MRN:  [id number], DOB:  1941/06/05, LOS: 7 ADMISSION DATE:  01/15/2020, CONSULTATION DATE: 6/14 REFERRING MD: Dr. Vanita Panda EDP, CHIEF COMPLAINT: Cardiac arrest  Brief History   79 year old female with ESRD on HD presented to [facility] emergency department 6/14 with respiratory distress and suffered cardiac arrest immediately upon arrival.  Brief arrest and responsive after.  Intubated during arrest and transferred to [facility] ED for ICU admission.  History of present illness   78 year old female with past medical history as below, which is significant for ESRD on HD Monday Wednesday Friday, atrial fibrillation on Coumadin, multiple recent admissions for community-acquired pneumonia, and heart failure with reduced ejection fraction 40 to 45%.  She has been compliant with dialysis and has reportedly not missed a treatment.  Last treatment 4/11.  In the morning hours of 6/14 when her son who she lives with attempted to wake her up for dialysis she was difficult to arouse and complained of trouble breathing.  He transported her to the emergency department and she became unresponsive as they pulled into the parking lot.  ED staff immediately realized she was pulseless and began resuscitative efforts.  Presenting rhythm was asystole.  The patient underwent what sounds like 2 rounds of CPR including epinephrine, atropine, and hyperkalemia treatment.  There was concern for STEMI based on rhythm change however, EKG was reviewed by cardiology who was not quite as concerned.  The patient was transferred to [facility] emergency department with plans for admission to ICU when bed became available.  In [facility] emergency department the patient was awake on the ventilator and able to respond to nursing.  She was given given sedation for vent comfort.  She was found to be hypotensive and was started on phenylephrine IV infusion. PCCM called for admission.   Past Medical History   has a past  medical history of Anemia, Atrial fibrillation ([place]), Chest pain (63/87/5643), Diastolic heart failure ([place]), Diverticulosis, ESRD on hemodialysis ([place]), GERD (gastroesophageal reflux disease), Headache, Mixed hyperlipidemia, Peripheral neuropathy, PONV (postoperative nausea and vomiting), Systemic hypertension, and Uterine cancer ([place]).   Significant Hospital Events   6/14 admit for cardiac arrest  Consults:  Nephrology  Procedures:  ETT 6/14 > 6/19. 6/19 >  CVL 6/14 >  Significant Diagnostic Tests:  CXR on presentation: R>L infiltrate which is evolving   Micro Data:  Blood 6/14 > Tracheal aspirate 6/21 >  Antimicrobials:  Cefepime 6/14 > Vancomycin 6/14 >  Interim history/subjective:  No acute events. On 80 neo.  Objective   Blood pressure 106/84, pulse (!) 125, temperature 97.7 F (36.5 C), temperature source Oral, resp. rate (!) 24, height 5\' 4"  (1.626 m), weight 96.1 kg, SpO2 100 %.    Vent Mode: PRVC FiO2 (%):  [40 %] 40 % Set Rate:  [24 bmp] 24 bmp Vt Set:  [430 mL] 430 mL PEEP:  [5 cmH20] 5 cmH20 Plateau Pressure:  [21 cmH20-26 cmH20] 21 cmH20   Intake/Output Summary (Last 24 hours) at 01/26/2020 0930 Last data filed at 01/26/2020 0900 Gross per 24 hour  Intake 2313.12 ml  Output 5047 ml  Net -2733.88 ml   Filed Weights   01/24/20 0400 01/25/20 0400 01/26/20 0400  Weight: 98.9 kg 99.1 kg 96.1 kg    Examination: General: Obese female on vent HENT: [place]/AT, PERRL, no JVD Lungs: Rhonchi bilaterally.  Thick secretions. Cardiovascular: Tachy, IRIR, no MRG Abdomen: Soft, non-tender, non-distended Extremities: No acute deformity or ROM limitation Neuro: Awake and  follows commands.    Assessment & Plan:   Acute hypoxemic respiratory failure: History of multiple pneumonias recently and has a R sided infiltrate on CXR. Likely a component of pulmonary edema as well. Attempted extubation to high flow nasal cannula today given increasing agitation and minimal  ventilator requirements but promptly failed and required re-intubation.  Cords and arytenoid swollen on reintubation. - Continue full ventilatory support. - Consider empiric steroids prior to next extubation attempt. - Continue chest physiotherapy, BD's. - Follow CXR.  Critically ill due to septic shock due to pneumonia.   - Continue Phenylephrine to keep MAP>65. - Continue midodrine. - Continue cefepime x 7 days total. - Assess tracheal aspirate given thick secretions.  Cardiac arrest: PEA/Asystole. Awake and following commands on arrival to ED. Likely a primary respiratory arrest. ROSC with 2 rounds CPR.  - Does not need TTM given good neurological recovery.  - Supportive care.   ESRD on HD - Continue CRRT.  - Continue fluid removal as able.  Atrial fibrillation: on warfarin, metoprolol at home. - Continue home warfarin and metoprolol.  Anemia of chronic disease. - Transfuse for Hgb < 7.  Best practice:  Diet: NPO - tube feeds.  Pain/Anxiety/Delirium protocol (if indicated): Fentanyl infusion, started enteral quetiapine VAP protocol (if indicated): Bundle in place DVT prophylaxis: On warfarin for atrial fibrillation GI prophylaxis: PPI Glucose control: SSI Mobility: BR Code Status: FULL (has been DNR in the past), son at bedside endorses full code.  Family Communication: Will call. Disposition: ICU   CC time: 30 min.   Montey Hora, [facility] 01/26/2020, 9:43 AM

## 2020-01-26 NOTE — Progress Notes (Signed)
Renal Navigator provided update to patient's OP HD clinic/[facility].  Shetara Launer Elizabeth, LCSW Renal Navigator [phone] 

## 2020-01-27 DIAGNOSIS — J9602 Acute respiratory failure with hypercapnia: Secondary | ICD-10-CM

## 2020-01-27 DIAGNOSIS — J181 Lobar pneumonia, unspecified organism: Secondary | ICD-10-CM

## 2020-01-27 LAB — CBC WITH DIFFERENTIAL/PLATELET
Abs Immature Granulocytes: 0.81 10*3/uL — ABNORMAL HIGH (ref 0.00–0.07)
Basophils Absolute: 0.1 10*3/uL (ref 0.0–0.1)
Basophils Relative: 0 %
Eosinophils Absolute: 0.4 10*3/uL (ref 0.0–0.5)
Eosinophils Relative: 2 %
HCT: 26.1 % — ABNORMAL LOW (ref 36.0–46.0)
Hemoglobin: 8.1 g/dL — ABNORMAL LOW (ref 12.0–15.0)
Immature Granulocytes: 4 %
Lymphocytes Relative: 24 %
Lymphs Abs: 4.7 10*3/uL — ABNORMAL HIGH (ref 0.7–4.0)
MCH: 39.3 pg — ABNORMAL HIGH (ref 26.0–34.0)
MCHC: 31 g/dL (ref 30.0–36.0)
MCV: 126.7 fL — ABNORMAL HIGH (ref 80.0–100.0)
Monocytes Absolute: 3.4 10*3/uL — ABNORMAL HIGH (ref 0.1–1.0)
Monocytes Relative: 17 %
Neutro Abs: 10.5 10*3/uL — ABNORMAL HIGH (ref 1.7–7.7)
Neutrophils Relative %: 53 %
Platelets: 126 10*3/uL — ABNORMAL LOW (ref 150–400)
RBC: 2.06 MIL/uL — ABNORMAL LOW (ref 3.87–5.11)
RDW: 18.4 % — ABNORMAL HIGH (ref 11.5–15.5)
WBC: 19.8 10*3/uL — ABNORMAL HIGH (ref 4.0–10.5)
nRBC: 5.2 % — ABNORMAL HIGH (ref 0.0–0.2)

## 2020-01-27 LAB — RENAL FUNCTION PANEL
Albumin: 2.6 g/dL — ABNORMAL LOW (ref 3.5–5.0)
Albumin: 2.9 g/dL — ABNORMAL LOW (ref 3.5–5.0)
Anion gap: 10 (ref 5–15)
Anion gap: 15 (ref 5–15)
BUN: 35 mg/dL — ABNORMAL HIGH (ref 8–23)
BUN: 34 mg/dL — ABNORMAL HIGH (ref 8–23)
CO2: 26 mmol/L (ref 22–32)
CO2: 22 mmol/L (ref 22–32)
Calcium: 10.1 mg/dL (ref 8.9–10.3)
Calcium: 10.3 mg/dL (ref 8.9–10.3)
Chloride: 101 mmol/L (ref 98–111)
Chloride: 100 mmol/L (ref 98–111)
Creatinine, Ser: 1.74 mg/dL — ABNORMAL HIGH (ref 0.44–1.00)
Creatinine, Ser: 1.73 mg/dL — ABNORMAL HIGH (ref 0.44–1.00)
GFR calc Af Amer: 32 mL/min — ABNORMAL LOW (ref >60)
GFR calc Af Amer: 32 mL/min — ABNORMAL LOW (ref >60)
GFR calc non Af Amer: 27 mL/min — ABNORMAL LOW (ref >60)
GFR calc non Af Amer: 27 mL/min — ABNORMAL LOW (ref >60)
Glucose, Bld: 148 mg/dL — ABNORMAL HIGH (ref 70–99)
Glucose, Bld: 152 mg/dL — ABNORMAL HIGH (ref 70–99)
Phosphorus: 2.9 mg/dL (ref 2.5–4.6)
Phosphorus: 4.7 mg/dL — ABNORMAL HIGH (ref 2.5–4.6)
Potassium: 5 mmol/L (ref 3.5–5.1)
Potassium: 5.8 mmol/L — ABNORMAL HIGH (ref 3.5–5.1)
Sodium: 137 mmol/L (ref 135–145)
Sodium: 137 mmol/L (ref 135–145)

## 2020-01-27 LAB — GLUCOSE, CAPILLARY
Glucose-Capillary: 143 mg/dL — ABNORMAL HIGH (ref 70–99)
Glucose-Capillary: 155 mg/dL — ABNORMAL HIGH (ref 70–99)
Glucose-Capillary: 156 mg/dL — ABNORMAL HIGH (ref 70–99)
Glucose-Capillary: 159 mg/dL — ABNORMAL HIGH (ref 70–99)
Glucose-Capillary: 178 mg/dL — ABNORMAL HIGH (ref 70–99)
Glucose-Capillary: 187 mg/dL — ABNORMAL HIGH (ref 70–99)

## 2020-01-27 LAB — APTT: aPTT: 33 seconds (ref 24–36)

## 2020-01-27 LAB — MAGNESIUM: Magnesium: 2.6 mg/dL — ABNORMAL HIGH (ref 1.7–2.4)

## 2020-01-27 LAB — PROTIME-INR
INR: 1.4 — ABNORMAL HIGH (ref 0.8–1.2)
Prothrombin Time: 16.3 seconds — ABNORMAL HIGH (ref 11.4–15.2)

## 2020-01-27 LAB — HEPARIN LEVEL (UNFRACTIONATED): Heparin Unfractionated: 0.77 IU/mL — ABNORMAL HIGH (ref 0.30–0.70)

## 2020-01-27 MED ORDER — HEPARIN (PORCINE) 25000 UT/250ML-% IV SOLN
1050.0000 [IU]/h | INTRAVENOUS | Status: DC
  Administered 2020-01-27: 1150 [IU]/h via INTRAVENOUS
  Filled 2020-01-27 (×2): qty 250

## 2020-01-27 MED ORDER — AMIODARONE HCL IN DEXTROSE 360-4.14 MG/200ML-% IV SOLN
60.0000 mg/h | INTRAVENOUS | Status: AC
  Administered 2020-01-27: 60 mg/h via INTRAVENOUS
  Filled 2020-01-27: qty 200

## 2020-01-27 MED ORDER — HEPARIN BOLUS VIA INFUSION
3500.0000 [IU] | Freq: Once | INTRAVENOUS | Status: DC
  Filled 2020-01-27: qty 3500

## 2020-01-27 MED ORDER — DARBEPOETIN ALFA 100 MCG/0.5ML IJ SOSY
100.0000 ug | PREFILLED_SYRINGE | INTRAMUSCULAR | Status: DC
  Administered 2020-01-28: 100 ug via INTRAVENOUS
  Filled 2020-01-27 (×3): qty 0.5

## 2020-01-27 MED ORDER — SODIUM PHOSPHATES 45 MMOLE/15ML IV SOLN
20.0000 mmol | Freq: Once | INTRAVENOUS | Status: AC
  Administered 2020-01-27: 20 mmol via INTRAVENOUS
  Filled 2020-01-27: qty 6.67

## 2020-01-27 MED ORDER — WARFARIN SODIUM 7.5 MG PO TABS
15.0000 mg | ORAL_TABLET | Freq: Once | ORAL | Status: DC
  Filled 2020-01-27: qty 2

## 2020-01-27 MED ORDER — PRO-STAT SUGAR FREE PO LIQD
30.0000 mL | Freq: Two times a day (BID) | ORAL | Status: DC
  Administered 2020-01-27 – 2020-02-01 (×12): 30 mL
  Filled 2020-01-27 (×11): qty 30

## 2020-01-27 MED ORDER — PANTOPRAZOLE SODIUM 40 MG PO PACK
40.0000 mg | PACK | Freq: Every day | ORAL | Status: DC
  Administered 2020-01-27 – 2020-02-01 (×6): 40 mg
  Filled 2020-01-27 (×6): qty 20

## 2020-01-27 MED ORDER — VITAL AF 1.2 CAL PO LIQD
1000.0000 mL | ORAL | Status: DC
  Administered 2020-01-27 – 2020-02-01 (×7): 1000 mL

## 2020-01-27 MED ORDER — AMIODARONE HCL IN DEXTROSE 360-4.14 MG/200ML-% IV SOLN
30.0000 mg/h | INTRAVENOUS | Status: DC
  Administered 2020-01-27 – 2020-02-06 (×21): 30 mg/h via INTRAVENOUS
  Filled 2020-01-27 (×22): qty 200

## 2020-01-27 MED ORDER — HEPARIN BOLUS VIA INFUSION
2500.0000 [IU] | Freq: Once | INTRAVENOUS | Status: AC
  Administered 2020-01-27: 2500 [IU] via INTRAVENOUS
  Filled 2020-01-27: qty 2500

## 2020-01-27 NOTE — Progress Notes (Signed)
Pt requiring more BP support with vasopressors on CRRT, notified Nephrology MD Carolin Sicks and obtained orders to keep even for remainder of shift as pt is down 6 liters since admission and requiring more pressors. Adjustments to therapy made, other VSS.

## 2020-01-27 NOTE — Progress Notes (Signed)
NAME:  Sabrina Beck, MRN:  [id number], DOB:  1941/03/12, LOS: 8 ADMISSION DATE:  02/04/2020, CONSULTATION DATE: 6/14 REFERRING MD: Dr. Vanita Panda EDP, CHIEF COMPLAINT: Cardiac arrest  Brief History   79 year old female with ESRD on HD presented to [facility] emergency department 6/14 with respiratory distress and suffered cardiac arrest immediately upon arrival.  Brief arrest and responsive after.  Intubated during arrest and transferred to [facility] ED for ICU admission.  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  She has been compliant with dialysis and has reportedly not missed a treatment.  Last treatment 4/11.  In the morning hours of 6/14 when her son who she lives with attempted to wake her up for dialysis she was difficult to arouse and complained of trouble breathing.  He transported her to the emergency department and she became unresponsive as they pulled into the parking lot.  ED staff immediately realized she was pulseless and began resuscitative efforts.  Presenting rhythm was asystole.  The patient underwent what sounds like 2 rounds of CPR including epinephrine, atropine, and hyperkalemia treatment.  There was concern for STEMI based on rhythm change however, EKG was reviewed by cardiology who was not quite as concerned.  The patient was transferred to [facility] emergency department with plans for admission to ICU when bed became available.  In [facility] emergency department the patient was awake on the ventilator and able to respond to nursing.  She was given given sedation for vent comfort.  She was found to be hypotensive and was started on phenylephrine IV infusion. PCCM called for admission.   Past Medical History   has a past  medical history of Anemia, Atrial fibrillation ([place]), Chest pain (77/41/2878), Diastolic heart failure ([place]), Diverticulosis, ESRD on hemodialysis ([place]), GERD (gastroesophageal reflux disease), Headache, Mixed hyperlipidemia, Peripheral neuropathy, PONV (postoperative nausea and vomiting), Systemic hypertension, and Uterine cancer ([place]).   Significant Hospital Events   6/14 admit for cardiac arrest  Consults:  Nephrology  Procedures:  ETT 6/14 > 6/19. 6/19 >  CVL 6/14 >  Significant Diagnostic Tests:  CXR on presentation: R>L infiltrate which is evolving   Micro Data:  Blood 6/14 > Tracheal aspirate 6/21 >  Antimicrobials:  Cefepime 6/14 > Vancomycin 6/14 >  Interim history/subjective:  Ongoing increased respiratory secretions.  Had a large mucous plug requiring lavage to remove last night.  Tachycardia remains uncontrolled.  Objective   Blood pressure 91/62, pulse (!) 112, temperature 98.5 F (36.9 C), temperature source Axillary, resp. rate (!) 24, height 5\' 4"  (1.626 m), weight 93.3 kg, SpO2 100 %.    Vent Mode: PRVC FiO2 (%):  [40 %] 40 % Set Rate:  [24 bmp] 24 bmp Vt Set:  [430 mL] 430 mL PEEP:  [5 cmH20] 5 cmH20 Plateau Pressure:  [18 cmH20-23 cmH20] 23 cmH20   Intake/Output Summary (Last 24 hours) at 01/27/2020 1632 Last data filed at 01/27/2020 1600 Gross per 24 hour  Intake 2869.68 ml  Output 5391 ml  Net -2521.32 ml   Filed Weights   01/25/20 0400 01/26/20 0400 01/27/20 0500  Weight: 99.1 kg 96.1 kg 93.3 kg    Examination: General: Elderly woman on mechanical ventilation HENT: [place]/AT, eyes anicteric Lungs: Tachypneic, breathing above the vent.  Thick tan secretions from  ET tube. Cardiovascular: Tachycardic, irregular rhythm Abdomen: Soft, nontender, nondistended Extremities: No peripheral edema, no clubbing or cyanosis Neuro: RASS -4, intact cough and gag, pupils reactive   Assessment & Plan:   Acute hypoxemic respiratory failure due to RLL  community-acquired pneumonia: History of multiple pneumonias recently and has a R sided infiltrate on CXR.  Attempted extubation to high flow nasal cannula today given increasing agitation and minimal ventilator requirements but promptly failed and required re-intubation.  Cords and arytenoid swollen on reintubation. -Continue LTVV -Daily SAT and SBT -Needs empiric steroids prior to next extubation attempt -Copious secretions and mucous plugging precludes extubation -Sedation as required to maintain vent tolerance -VAP Prevention protocol  Critically ill due to septic shock due to pneumonia.   -Continue phenylephrine to maintain MAP greater than 65 - Continue midodrine. -Continue to follow tracheal aspirate culture -Continue broad-spectrum antibiotics  Cardiac arrest: PEA/Asystole. Awake and following commands on arrival to ED. Likely a primary respiratory arrest. ROSC with 2 rounds CPR.  -Continue supportive care, no neuro recovery  ESRD on HD -Continue CRRT -Renally dose meds and avoid nephrotoxic meds  Atrial fibrillation: on warfarin, metoprolol at home. -Continue PTA metoprolol -Adding amiodarone for rate control -Discontinue warfarin -Heparin infusion  Anemia of chronic disease -Continue to monitor -Transfuse for hemoglobin <7 or hemodynamically significant bleeding  Best practice:  Diet: NPO - tube feeds.  Pain/Anxiety/Delirium protocol (if indicated): Fentanyl infusion, started enteral quetiapine VAP protocol (if indicated): Bundle in place DVT prophylaxis: On warfarin for atrial fibrillation GI prophylaxis: PPI Glucose control: SSI Mobility: BR Code Status: FULL (has been DNR in the past), son at bedside endorses full code.  Family Communication: Updated family by phone Disposition: ICU    This patient is critically ill with multiple organ system failure which requires frequent high complexity decision making, assessment, support, evaluation, and titration of  therapies. This was completed through the application of advanced monitoring technologies and extensive interpretation of multiple databases. During this encounter critical care time was devoted to patient care services described in this note for 40 minutes.   Julian Hy, DO 01/27/20 5:25 PM [facility]

## 2020-01-27 NOTE — Progress Notes (Signed)
[facility] Progress Note  Subjective: I/O -   Removed 2500 with CRRT yesterday--  BP soft- pressors increased again- backed off on UF-  Some purulent appearing secretions   Vitals:   01/27/20 0700 01/27/20 0715 01/27/20 0730 01/27/20 0745  BP: (!) 94/48 (!) 94/54 97/70 92/66   Pulse: (!) 102 98 (!) 109 65  Resp: (!) 24 (!) 24 17 (!) 23  Temp:  99.7 F (37.6 C)    TempSrc:  Axillary    SpO2: 100% 100% 99% 100%  Weight:      Height:        Exam: Gen on vent, arouses and responds appropriately- currently on vibrating mode-  left IJ TDC in for 7 days No jvd or bruits Chest occ rhonchi, improved, no wheezing RRR no MRG Abd soft ntnd no mass or ascites +bs Ext pitting edema to dep areas Neuro responsive LFA AVF+bruit      OP HD: [facility] [place] [place]   3.5h  2/2.5 bath  97.5kg  No heparin  LFA AVF  - EPO 6600 q Rx   Assessment/ Plan: 1. Cardiac arrest - resuscitated in ED-  Is awake 2. Sepsis/ shock/ PNA - getting IV abx, still requiring pressor support. Getting midodrine at 10 tid. Hx prior admits for PNA.  3. Acute resp failure - on vent,  CXR w persistent edema today, UF as able-  Had prompt failure of extubation over the weekend 4. ESRD - on HD MWF.  CRRT started on 6/15 due to shock. Continue. Using 2K pre filter fluids, others are 4K.  Need to continue CRRT as req pressors  - IJ vascath 12 days old 5. 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 Ca up slightly. P a little low, will give some 7. Anemia ckd - Hb 8.1, transfuse prn. SP darbe 60ug 6/16 and weekly while here. Tsat 89%, no need for IV Fe. will increase ESA 8. Hyperkalemia - resolved, cont 2K pre-filter fluids, all others are 4K 9. Atrial fibrillation - getting metoprolol 25 bid for rate control 10. Chronic s/d CHF     Sabrina Beck A Khaniya Tenaglia  01/27/2020,  7:54 AM   Recent Labs  Lab 01/26/20 0331 01/26/20 0331 01/26/20 1633 01/27/20 0308  K 4.7   < > 4.7 5.0  BUN 33*   < > 36* 35*  CREATININE 1.66*   < > 1.75* 1.74*  CALCIUM 10.3   < > 10.3 10.1  PHOS 3.2   < > 3.5 2.9  HGB 7.8*  --   --  8.1*   < > = values in this interval not displayed.   Inpatient medications: . B-complex with vitamin C  1 tablet Per Tube Daily  . chlorhexidine gluconate (MEDLINE KIT)  15 mL Mouth Rinse BID  . Chlorhexidine Gluconate Cloth  6 each Topical Daily  . darbepoetin (ARANESP) injection - DIALYSIS  60 mcg Intravenous Q Wed-HD  . docusate  100 mg Per Tube BID  . insulin aspart  0-6 Units Subcutaneous Q4H  . ipratropium-albuterol  3 mL Nebulization Q6H  . levothyroxine  75 mcg Per Tube Q0600  . mouth rinse  15 mL Mouth Rinse 10 times per day  . metoprolol tartrate  25 mg Per Tube BID  . midodrine  10 mg Per Tube TID  . pantoprazole (PROTONIX)  IV  40 mg Intravenous QHS  . polyethylene glycol  17 g Per Tube Daily  . QUEtiapine  50 mg Per Tube BID  . sodium chloride flush  10-40 mL Intracatheter Q12H  . Warfarin - Pharmacist Dosing Inpatient   Does not apply q1600   .  prismasol BGK 4/2.5 200 mL/hr at 01/26/20 2056  . sodium chloride 10 mL/hr at 01/27/20 0700  . feeding supplement (VITAL HIGH PROTEIN) 1,000 mL (01/26/20 1300)  . fentaNYL infusion INTRAVENOUS 40 mcg/hr (01/27/20 0700)  . phenylephrine (NEO-SYNEPHRINE) Adult infusion 145 mcg/min (01/27/20 0700)  . prismasol BGK 2/2.5 replacement solution 400 mL/hr at 01/26/20 2004  . prismasol BGK 4/2.5 1,600 mL/hr at 01/27/20 0110   sodium chloride, alteplase, fentaNYL, heparin, midazolam, polyethylene glycol, sodium chloride, sodium chloride flush

## 2020-01-27 NOTE — Progress Notes (Signed)
[place] for warfarin >> Heparin Indication: atrial fibrillation  No Known Allergies  Patient Measurements: Height: 5\' 4"  (162.6 cm) Weight: 93.3 kg (205 lb 11 oz) IBW/kg (Calculated) : 54.7 Heparin Dosing Weight: 75.8 kg  Vital Signs: Temp: 98.8 F (37.1 C) (06/22 1121) Temp Source: Axillary (06/22 1121) BP: 83/58 (06/22 1215) Pulse Rate: 111 (06/22 1215)  Labs: Recent Labs    01/25/20 0231 01/25/20 1549 01/26/20 0331 01/26/20 1633 01/27/20 0308  HGB 7.8*  --  7.8*  --  8.1*  HCT 24.1*  --  25.1*  --  26.1*  PLT 142*  --  136*  --  126*  APTT 32  --  27  --  33  LABPROT 15.0  --  14.8  --  16.3*  INR 1.2  --  1.2  --  1.4*  CREATININE 1.61*   < > 1.66* 1.75* 1.74*   < > = values in this interval not displayed.    Estimated Creatinine Clearance: 28.5 mL/min (A) (by C-G formula based on SCr of 1.74 mg/dL (H)).   Assessment: 69 yof on warfarin PTA for history of afib admitted with respiratory distress with cardiac arrest on arrival to ER with ROSC. Pt switched to heparin in case any procedures needed.  Heparin level slightly supratherapeutic (0.77) on gtt at 1150 units/hr. No bleeding noted.  Goal of Therapy:  INR 2-3 Heparin level 0.3-0.7 units/ml Monitor platelets by anticoagulation protocol: Yes   Plan:  Decrease heparin gtt to 1050 units/hr Heparin level in 8 hours.  Daily Heparin level and CBC while on therapy.  Monitor platelets closely.   Sherlon Handing, PharmD, BCPS Please see amion for complete clinical pharmacist phone list 01/27/2020, 10:58 PM

## 2020-01-27 NOTE — Progress Notes (Signed)
Nutrition Follow-up  DOCUMENTATION CODES:   Obesity unspecified  INTERVENTION:   Continue tube feeding via OGT: To better meet re-estimated needs, change to Vital AF 1.2 at 55 ml/h (1320 ml per day) Pro-stat 30 ml BID  Provides 1784 kcal, 129 gm protein, 1071 ml free water daily  Continue B-complex with vitamin C while on CRRT  NUTRITION DIAGNOSIS:   Inadequate oral intake related to inability to eat as evidenced by NPO status.  Ongoing   GOAL:   Provide needs based on ASPEN/SCCM guidelines  Progressing  MONITOR:   Vent status, Labs, Weight trends, Skin, I & O's  ASSESSMENT:   79 year old female who presented on 6/14 with respiratory distress and cardiac arrest immediately upon arrival to the ED. Pt required intubation. PMH of ESRD on HD, atrial fibrillation, CHF. Pt did not require TTM.  Discussed patient in ICU rounds and with RN today. OG tube in place. Receiving Vital High Protein at 55 ml/h, tolerating well.  CRRT ongoing. 2500 ml removed yesterday.   Patient remains intubated on ventilator support. MV: 10.7 L/min Temp (24hrs), Avg:98.6 F (37 C), Min:97.9 F (36.6 C), Max:99.7 F (37.6 C)   Labs reviewed. Potassium 5 WNL, BUN 35 (H), Creat 1.74 (H), phosphorus 2.9 WNL, mag 2.6 (H) CBG: 143-156  Medications reviewed and include B-complex with vitamin C, Aranesp, Colace, novolog, miralax, phenylephrine, sodium phosphate.  I/O - 4.7 L Weight trending down: 93.3 kg today; 98.9 kg on admission.  Diet Order:   Diet Order            Diet NPO time specified  Diet effective now                 EDUCATION NEEDS:   No education needs have been identified at this time  Skin:  Skin Assessment: Reviewed RN Assessment (MASD to groin, breast)  Last BM:  6/22 type 7  Height:   Ht Readings from Last 1 Encounters:  01/28/2020 5\' 4"  (1.626 m)    Weight:   Wt Readings from Last 1 Encounters:  01/27/20 93.3 kg    Ideal Body Weight:  54.5 kg  BMI:   Body mass index is 35.31 kg/m.  Estimated Nutritional Needs:   Kcal:  1740  Protein:  109-130 grams  Fluid:  1000 ml + UOP    Lucas Mallow, RD, LDN, CNSC Please refer to Amion for contact information.

## 2020-01-27 NOTE — Progress Notes (Addendum)
[place] for warfarin >> Heparin Indication: atrial fibrillation  No Known Allergies  Patient Measurements: Height: 5\' 4"  (162.6 cm) Weight: 93.3 kg (205 lb 11 oz) IBW/kg (Calculated) : 54.7 Heparin Dosing Weight: 75.8 kg  Vital Signs: Temp: 98.8 F (37.1 C) (06/22 1121) Temp Source: Axillary (06/22 1121) BP: 83/58 (06/22 1215) Pulse Rate: 111 (06/22 1215)  Labs: Recent Labs    01/25/20 0231 01/25/20 1549 01/26/20 0331 01/26/20 1633 01/27/20 0308  HGB 7.8*  --  7.8*  --  8.1*  HCT 24.1*  --  25.1*  --  26.1*  PLT 142*  --  136*  --  126*  APTT 32  --  27  --  33  LABPROT 15.0  --  14.8  --  16.3*  INR 1.2  --  1.2  --  1.4*  CREATININE 1.61*   < > 1.66* 1.75* 1.74*   < > = values in this interval not displayed.    Estimated Creatinine Clearance: 28.5 mL/min (A) (by C-G formula based on SCr of 1.74 mg/dL (H)).   Assessment: 49 yof on warfarin PTA for history of afib admitted with respiratory distress with cardiac arrest on arrival to ER with ROSC. Patient currently intubated, on CRRT, and receiving tube feeds. Warfarin was held initially with INR supratherapeutic at 5.1 on admit. Pharmacy consulted to resume warfarin. PTA warfarin dose: 10mg  daily except 5mg  on Friday per med rec and last outpatient anticoag clinic note (last dose 6/13 PTA)  INR 6/22 remains sub-therapeutic at 1.4 after a dose of 5mg , 10 mg and then 10mg , and 15 mg last night. Starting to trend upwards some. H/H is low but stable. Platelets are trending down at 126.   Discussed with Dr. Carlis Abbott - wishes to change to IV Heparin for now in case of any procedures. Will transition now as INR <2. Hemoglobin 8.1, Platelets down to 126 - no bleeding noted.    Goal of Therapy:  INR 2-3 Heparin level 0.3-0.7 units/ml Monitor platelets by anticoagulation protocol: Yes   Plan:  Discontinue Warfarin therapy.  Start IV Heparin with bolus 2500 units bolus (low with low  platelets and INR 1.4) then initiate drip at 1150 units/hr.  Heparin level in 8 hours.  Daily Heparin level and CBC while on therapy.  Monitor platelets closely.   Sloan Leiter, PharmD, BCPS, BCCCP Clinical Pharmacist Please refer to [facility] for [place] numbers 01/27/2020, 11:38 AM

## 2020-01-27 NOTE — Progress Notes (Signed)
[facility] Progress Note Patient Name: Sabrina Beck DOB: Jan 25, 1941 MRN: [id number]   Date of Service  01/27/2020  HPI/Events of Note  Nursing request to change Protonix IV to Protonix per tube.   eICU Interventions  Will change Protonix to 40 mg per tube daily.      Intervention Category Major Interventions: Other:  Lysle Dingwall 01/27/2020, 9:45 PM

## 2020-01-28 ENCOUNTER — Inpatient Hospital Stay (HOSPITAL_COMMUNITY): Payer: Medicare HMO

## 2020-01-28 DIAGNOSIS — R739 Hyperglycemia, unspecified: Secondary | ICD-10-CM

## 2020-01-28 DIAGNOSIS — R579 Shock, unspecified: Secondary | ICD-10-CM

## 2020-01-28 DIAGNOSIS — I4891 Unspecified atrial fibrillation: Secondary | ICD-10-CM

## 2020-01-28 LAB — CBC WITH DIFFERENTIAL/PLATELET
Abs Immature Granulocytes: 0.77 10*3/uL — ABNORMAL HIGH (ref 0.00–0.07)
Basophils Absolute: 0.2 10*3/uL — ABNORMAL HIGH (ref 0.0–0.1)
Basophils Relative: 1 %
Eosinophils Absolute: 0.4 10*3/uL (ref 0.0–0.5)
Eosinophils Relative: 2 %
HCT: 32 % — ABNORMAL LOW (ref 36.0–46.0)
Hemoglobin: 9.7 g/dL — ABNORMAL LOW (ref 12.0–15.0)
Immature Granulocytes: 3 %
Lymphocytes Relative: 14 %
Lymphs Abs: 3.3 10*3/uL (ref 0.7–4.0)
MCH: 38.6 pg — ABNORMAL HIGH (ref 26.0–34.0)
MCHC: 30.3 g/dL (ref 30.0–36.0)
MCV: 127.5 fL — ABNORMAL HIGH (ref 80.0–100.0)
Monocytes Absolute: 4.2 10*3/uL — ABNORMAL HIGH (ref 0.1–1.0)
Monocytes Relative: 18 %
Neutro Abs: 14.3 10*3/uL — ABNORMAL HIGH (ref 1.7–7.7)
Neutrophils Relative %: 62 %
Platelets: 144 10*3/uL — ABNORMAL LOW (ref 150–400)
RBC: 2.51 MIL/uL — ABNORMAL LOW (ref 3.87–5.11)
RDW: 18.1 % — ABNORMAL HIGH (ref 11.5–15.5)
WBC: 23.1 10*3/uL — ABNORMAL HIGH (ref 4.0–10.5)
nRBC: 3.1 % — ABNORMAL HIGH (ref 0.0–0.2)

## 2020-01-28 LAB — RENAL FUNCTION PANEL
Albumin: 3.2 g/dL — ABNORMAL LOW (ref 3.5–5.0)
Albumin: 2.4 g/dL — ABNORMAL LOW (ref 3.5–5.0)
Anion gap: 16 — ABNORMAL HIGH (ref 5–15)
Anion gap: 14 (ref 5–15)
BUN: 35 mg/dL — ABNORMAL HIGH (ref 8–23)
BUN: 33 mg/dL — ABNORMAL HIGH (ref 8–23)
CO2: 23 mmol/L (ref 22–32)
CO2: 22 mmol/L (ref 22–32)
Calcium: 10.9 mg/dL — ABNORMAL HIGH (ref 8.9–10.3)
Calcium: 10.2 mg/dL (ref 8.9–10.3)
Chloride: 98 mmol/L (ref 98–111)
Chloride: 98 mmol/L (ref 98–111)
Creatinine, Ser: 1.63 mg/dL — ABNORMAL HIGH (ref 0.44–1.00)
Creatinine, Ser: 1.55 mg/dL — ABNORMAL HIGH (ref 0.44–1.00)
GFR calc Af Amer: 34 mL/min — ABNORMAL LOW (ref >60)
GFR calc Af Amer: 36 mL/min — ABNORMAL LOW (ref >60)
GFR calc non Af Amer: 29 mL/min — ABNORMAL LOW (ref >60)
GFR calc non Af Amer: 31 mL/min — ABNORMAL LOW (ref >60)
Glucose, Bld: 169 mg/dL — ABNORMAL HIGH (ref 70–99)
Glucose, Bld: 205 mg/dL — ABNORMAL HIGH (ref 70–99)
Phosphorus: 4.1 mg/dL (ref 2.5–4.6)
Phosphorus: 4.7 mg/dL — ABNORMAL HIGH (ref 2.5–4.6)
Potassium: 5.3 mmol/L — ABNORMAL HIGH (ref 3.5–5.1)
Potassium: 5.1 mmol/L (ref 3.5–5.1)
Sodium: 137 mmol/L (ref 135–145)
Sodium: 134 mmol/L — ABNORMAL LOW (ref 135–145)

## 2020-01-28 LAB — CULTURE, RESPIRATORY W GRAM STAIN: Culture: NORMAL

## 2020-01-28 LAB — GLUCOSE, CAPILLARY
Glucose-Capillary: 185 mg/dL — ABNORMAL HIGH (ref 70–99)
Glucose-Capillary: 170 mg/dL — ABNORMAL HIGH (ref 70–99)
Glucose-Capillary: 170 mg/dL — ABNORMAL HIGH (ref 70–99)
Glucose-Capillary: 190 mg/dL — ABNORMAL HIGH (ref 70–99)
Glucose-Capillary: 168 mg/dL — ABNORMAL HIGH (ref 70–99)
Glucose-Capillary: 195 mg/dL — ABNORMAL HIGH (ref 70–99)

## 2020-01-28 LAB — PROTIME-INR
INR: 1.5 — ABNORMAL HIGH (ref 0.8–1.2)
Prothrombin Time: 17.1 seconds — ABNORMAL HIGH (ref 11.4–15.2)

## 2020-01-28 LAB — HEPARIN LEVEL (UNFRACTIONATED): Heparin Unfractionated: 0.67 IU/mL (ref 0.30–0.70)

## 2020-01-28 LAB — HEMOGLOBIN AND HEMATOCRIT, BLOOD
HCT: 31.2 % — ABNORMAL LOW (ref 36.0–46.0)
Hemoglobin: 10.2 g/dL — ABNORMAL LOW (ref 12.0–15.0)

## 2020-01-28 LAB — ECHOCARDIOGRAM COMPLETE
Height: 64 in
Weight: 3291.03 oz

## 2020-01-28 LAB — APTT: aPTT: 114 seconds — ABNORMAL HIGH (ref 24–36)

## 2020-01-28 LAB — MAGNESIUM: Magnesium: 2.7 mg/dL — ABNORMAL HIGH (ref 1.7–2.4)

## 2020-01-28 LAB — PREPARE RBC (CROSSMATCH)

## 2020-01-28 MED ORDER — DOCUSATE SODIUM 50 MG/5ML PO LIQD: 100.0000 mg | Freq: Every day | ORAL | Status: DC | PRN

## 2020-01-28 MED ORDER — SODIUM ZIRCONIUM CYCLOSILICATE 5 G PO PACK: 10.0000 g | PACK | Freq: Once | ORAL | Status: DC

## 2020-01-28 MED ORDER — VANCOMYCIN HCL 2000 MG/400ML IV SOLN
2000.0000 mg | Freq: Once | INTRAVENOUS | Status: AC
  Administered 2020-01-28: 2000 mg via INTRAVENOUS
  Filled 2020-01-28: qty 400

## 2020-01-28 MED ORDER — IOHEXOL 350 MG/ML SOLN
100.0000 mL | Freq: Once | INTRAVENOUS | Status: AC | PRN
  Administered 2020-01-28: 100 mL via INTRAVENOUS

## 2020-01-28 MED ORDER — VASOPRESSIN 20 UNIT/ML IV SOLN
0.0300 [IU]/min | INTRAVENOUS | Status: DC
  Administered 2020-01-28 – 2020-02-02 (×5): 0.03 [IU]/min via INTRAVENOUS
  Filled 2020-01-28 (×7): qty 2

## 2020-01-28 MED ORDER — VANCOMYCIN HCL IN DEXTROSE 1-5 GM/200ML-% IV SOLN
1000.0000 mg | INTRAVENOUS | Status: DC
  Filled 2020-01-28: qty 200

## 2020-01-28 MED ORDER — DIGOXIN 0.25 MG/ML IJ SOLN
0.5000 mg | Freq: Once | INTRAMUSCULAR | Status: AC
  Administered 2020-01-28: 0.5 mg via INTRAVENOUS
  Filled 2020-01-28: qty 2

## 2020-01-28 MED ORDER — FLUDROCORTISONE ACETATE 0.1 MG PO TABS
0.1000 mg | ORAL_TABLET | Freq: Every day | ORAL | Status: DC
  Administered 2020-01-28 – 2020-02-02 (×6): 0.1 mg
  Filled 2020-01-28 (×7): qty 1

## 2020-01-28 MED ORDER — SODIUM CHLORIDE 0.9% IV SOLUTION: Freq: Once | INTRAVENOUS | Status: AC

## 2020-01-28 MED ORDER — HYDROCORTISONE NA SUCCINATE PF 100 MG IJ SOLR
50.0000 mg | Freq: Four times a day (QID) | INTRAMUSCULAR | Status: DC
  Administered 2020-01-28 – 2020-02-03 (×24): 50 mg via INTRAVENOUS
  Filled 2020-01-28 (×24): qty 2

## 2020-01-28 MED ORDER — PRISMASOL BGK 0/2.5 32-2.5 MEQ/L IV SOLN
INTRAVENOUS | Status: DC
  Filled 2020-01-28 (×31): qty 5000

## 2020-01-28 MED ORDER — IPRATROPIUM-ALBUTEROL 0.5-2.5 (3) MG/3ML IN SOLN
3.0000 mL | Freq: Two times a day (BID) | RESPIRATORY_TRACT | Status: DC
  Administered 2020-01-28 – 2020-02-02 (×10): 3 mL via RESPIRATORY_TRACT
  Filled 2020-01-28 (×10): qty 3

## 2020-01-28 MED ORDER — DIGOXIN 0.25 MG/ML IJ SOLN
0.2500 mg | Freq: Four times a day (QID) | INTRAMUSCULAR | Status: AC
  Administered 2020-01-29 (×2): 0.25 mg via INTRAVENOUS
  Filled 2020-01-28 (×2): qty 2

## 2020-01-28 MED ORDER — INSULIN ASPART 100 UNIT/ML ~~LOC~~ SOLN
1.0000 [IU] | SUBCUTANEOUS | Status: DC
  Administered 2020-01-28 – 2020-01-29 (×6): 1 [IU] via SUBCUTANEOUS

## 2020-01-28 MED ORDER — SODIUM CHLORIDE 0.9 % IV SOLN
1.0000 g | Freq: Three times a day (TID) | INTRAVENOUS | Status: DC
  Administered 2020-01-28 – 2020-01-30 (×6): 1 g via INTRAVENOUS
  Filled 2020-01-28 (×8): qty 1

## 2020-01-28 NOTE — Progress Notes (Signed)
[facility] Progress Note  Subjective: I/O -   Removed 1700 total with CRRT yesterday--  BP soft and Afib- pressors increased again-   Vitals:   01/28/20 0615 01/28/20 0700 01/28/20 0715 01/28/20 0731  BP: 93/65 (!) 85/54 100/69   Pulse: (!) 115 (!) 116 (!) 112   Resp: 20 18 (!) 23   Temp:    98.4 F (36.9 C)  TempSrc:    Axillary  SpO2: 98% 100% 95%   Weight:      Height:        Exam: Gen on vent, arouses and responds appropriately- currently on vibrating mode-  left IJ TDC in for 8 days No jvd or bruits Chest occ rhonchi, improved, no wheezing RRR no MRG Abd soft ntnd no mass or ascites +bs Ext pitting edema to dep areas Neuro responsive LFA AVF+bruit      OP HD: [facility]   3.5h  2/2.5 bath  97.5kg  No heparin  LFA AVF  - EPO 6600 q Rx   Assessment/ Plan: 1. Cardiac arrest - resuscitated in ED-  Is awake 2. Sepsis/ shock/ PNA - getting IV abx, still requiring pressor support. Getting midodrine at 10 tid. Hx prior admits for PNA.  3. Acute resp failure - on vent,  CXR w persistent edema the other day, UF as able-  Had prompt failure of extubation over the weekend-  I think though need to back off on UF to keep even for  her BP 4. ESRD - on HD MWF.  CRRT started on 6/15 due to shock. Continue. Using 2K pre filter fluids, others are 4K.  Will change dialysate to 2 K given highish K's.   Need to continue CRRT as req pressors  - IJ vascath 42 days old-  AVF is patent 5. 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 Ca up slightly. P repleted 6/22 7. Anemia ckd - Hb 8.1---9.7, transfuse prn.  darbe 100ug  weekly while here. Tsat 89%, no need for IV Fe. Have increased ESA this week 8. Hyperkalemia - still present-  Will change dialysate to 2 K   Should give [place] more impact  9. Atrial fibrillation - getting  metoprolol 25 bid for rate control 10. Chronic s/d CHF     Persia Lintner A Zyann Mabry  01/28/2020, 7:39 AM   Recent Labs  Lab 01/27/20 0308 01/27/20 0308 01/27/20 1623 01/28/20 0306  K 5.0   < > 5.8* 5.3*  BUN 35*   < > 34* 35*  CREATININE 1.74*   < > 1.73* 1.63*  CALCIUM 10.1   < > 10.3 10.9*  PHOS 2.9   < > 4.7* 4.1  HGB 8.1*  --   --  9.7*   < > = values in this interval not displayed.   Inpatient medications:  B-complex with vitamin C  1 tablet Per Tube Daily   chlorhexidine gluconate (MEDLINE KIT)  15 mL Mouth Rinse BID   Chlorhexidine Gluconate Cloth  6 each Topical Daily   darbepoetin (ARANESP) injection - DIALYSIS  100 mcg Intravenous Q Wed-HD   docusate  100 mg Per Tube BID   feeding supplement (PRO-STAT SUGAR FREE 64)  30 mL Per Tube BID   insulin aspart  0-6 Units Subcutaneous Q4H   ipratropium-albuterol  3 mL Nebulization  Q6H   levothyroxine  75 mcg Per Tube Q0600   mouth rinse  15 mL Mouth Rinse 10 times per day   metoprolol tartrate  25 mg Per Tube BID   midodrine  10 mg Per Tube TID   pantoprazole sodium  40 mg Per Tube Q1200   polyethylene glycol  17 g Per Tube Daily   QUEtiapine  50 mg Per Tube BID   sodium chloride flush  10-40 mL Intracatheter Q12H     prismasol BGK 4/2.5 200 mL/hr at 01/27/20 2133   sodium chloride Stopped (01/27/20 1318)   amiodarone 30 mg/hr (01/28/20 0700)   feeding supplement (VITAL AF 1.2 CAL) 55 mL/hr at 01/28/20 0148   fentaNYL infusion INTRAVENOUS 50 mcg/hr (01/28/20 0700)   heparin 1,050 Units/hr (01/28/20 0700)   phenylephrine (NEO-SYNEPHRINE) Adult infusion 320 mcg/min (01/28/20 0700)   prismasol BGK 2/2.5 replacement solution 400 mL/hr at 01/27/20 2133   prismasol BGK 4/2.5 1,600 mL/hr at 01/28/20 0442   sodium chloride, alteplase, fentaNYL, heparin, midazolam, polyethylene glycol, sodium chloride, sodium chloride flush

## 2020-01-28 NOTE — Progress Notes (Signed)
Pharmacy Antibiotic Note  Sabrina Beck is a 79 y.o. female admitted on 02/02/2020 with respiratory distress and cardiac arrest on arrival to ED. Patient completed 9 day course of Cefepime. Now with worsening leukocytosis and significant secretions. Pharmacy has been consulted to restart Vancomycin and initiate Merrem dosing.  WBC up to 23.1, remains on pressors with escalating needs. Afebrile but on CRRT. 6/21 Sputum culture was negative. Re-culturing today. Patient remains on CRRT therapy - keeping even today.   Plan: Vancomycin 2000 mg IV x1 then 1000mg  IV every 24 hours on CRRT.  Merrem 1g IV every 8 hours while on CRRT.  Vancomycin level at Css.  Monitor CRRT plans, clinical status, and culture results.   Height: 5\' 4"  (162.6 cm) Weight: 93.3 kg (205 lb 11 oz) IBW/kg (Calculated) : 54.7  Temp (24hrs), Avg:98.3 F (36.8 C), Min:97.5 F (36.4 C), Max:98.8 F (37.1 C)  Recent Labs  Lab 01/24/20 0453 01/24/20 1759 01/25/20 0231 01/25/20 1549 01/26/20 0331 01/26/20 1633 01/27/20 0308 01/27/20 1623 01/28/20 0306  WBC 14.4*  --  23.7*  --  19.3*  --  19.8*  --  23.1*  CREATININE 1.57*   < > 1.61*   < > 1.66* 1.75* 1.74* 1.73* 1.63*   < > = values in this interval not displayed.    Estimated Creatinine Clearance: 30.5 mL/min (A) (by C-G formula based on SCr of 1.63 mg/dL (H)).    No Known Allergies  Antimicrobials this admission: Vanco 6/14>>6/15; 6/23 >> Cefepime 6/14>>6/22 Merrem 6/23 >>  Dose adjustments this admission:  Microbiology results: 6/14 BCx: negative 6/14 mrsa pcr - negative 6/21 Resp cx -NRF  Thank you for allowing pharmacy to be a part of this patient's care.  Sloan Leiter, PharmD, BCPS, BCCCP Clinical Pharmacist Please refer to [facility] for [place] numbers 01/28/2020 9:49 AM

## 2020-01-28 NOTE — Progress Notes (Signed)
[place] Progress Note Patient Name: Sabrina Beck DOB: [birth year] MRN: [id number]   Date of Service  01/28/2020  HPI/Events of Note  Pt needs post-transfusion hemoglobin.  eICU Interventions  Post transfusion hemoglobin ordered.        [name] [name] 01/28/2020, 10:50 PM

## 2020-01-28 NOTE — Progress Notes (Signed)
Called e-link to request repeat HGB and HCT following completion of blood transfusion.

## 2020-01-28 NOTE — Progress Notes (Cosign Needed Addendum)
NAME:  Sabrina Beck, MRN:  [id number], DOB:  1941-02-15, LOS: 9 ADMISSION DATE:  01/24/2020, CONSULTATION DATE:  01/16/2020 REFERRING MD:  EDP, CHIEF COMPLAINT:  Cardiac arrest   Brief History   79 year old female with ESRD on HD who was found hypoxic and difficult to arouse on 6/14 by her son. She was brought to the ED where she suffered a cardiac arrest upon arrival. She was transferred to ICU for further management.   History of present illness   Sabrina Beck is an 79 year old female with a PMH significant for atrial fibrillation on warfarin, HFpEF, and ESRD on HD (MWF) who presented on 6/14 when she was found to be hypoxic and difficult to arouse by her son. She was brought to the ED where she suffered a cardiac arrest immediately upon arrival. Initial presenting rhythm was asystole. She received 2 rounds of CPR with epinephrine, atropine, and treatment for hyperkalemia with ROSC. She was transferred to [facility] ICU to be managed by PCCM.  Past Medical History  Atrial fibrillation HFpEF Diverticulosis ESRD on HD (MWF) GERD Hyperlipidemia HTN Uterine CA  Significant Hospital Events   6/14 Admitted for cardiac arrest  Consults:  Nephrology  Procedures:  ETT 6/14 > 6/19. 6/19 > CVC 6/14 >  Significant Diagnostic Tests:  6/23 CT Chest/Abd with contrast >>  6/23 CXR >> LIJ HD catheter in place. ETT in place. Improved pulmonary edema, small bilateral pleural effusions. LLL atelectasis vs infiltrate  6/14 CT Head >> No acute abnormalities  Micro Data:  6/23 Respiratory culture >>  6/23 Blood culture >> 6/14 Blood culture >> NGTD  6/14 MRSA PCR >> Negative  6/14 SARS CoV2 >> Negative Antimicrobials:  Vancomycin 6/14 >> 6/15; 6/23 >> Meropenem 6/23 >> Cefepime 6/14 >> 6/22  Interim history/subjective:  Worsening purulent secretions and leukocytosis, failing SBT with tachycardia, increased WOB and secretions. She has remained on CRRT. Worsening shock with increased  pressor requirements  Objective   Blood pressure 103/81, pulse (!) 103, temperature 97.6 F (36.4 C), temperature source Oral, resp. rate (!) 27, height 5\' 4"  (1.626 m), weight 93.3 kg, SpO2 100 %.    Vent Mode: PSV;CPAP FiO2 (%):  [40 %] 40 % Set Rate:  [24 bmp] 24 bmp Vt Set:  [430 mL] 430 mL PEEP:  [5 cmH20] 5 cmH20 Pressure Support:  [8 cmH20] 8 cmH20 Plateau Pressure:  [23 cmH20-24 cmH20] 24 cmH20   Intake/Output Summary (Last 24 hours) at 01/28/2020 1310 Last data filed at 01/28/2020 1200 Gross per 24 hour  Intake 3001.73 ml  Output 4085 ml  Net -1083.27 ml   Filed Weights   01/26/20 0400 01/27/20 0500 01/28/20 0500  Weight: 96.1 kg 93.3 kg 93.3 kg    Examination: General: White female laying in bed on MV HENT: Non-icteric sclera. ETT in place. MMM Lungs: Trapezius and intercostal muscle use on MV. Rhonchi throughout bilaterally. Thick purulent secretions from ETT Cardiovascular: Irregular. Tachycardic. AF Abdomen: Soft, NT, ND Neuro: RASS -1. Following some commands. PERRL Integ: Left flank ecchymosis  Resolved Hospital Problem list    Assessment & Plan:  Acute hypoxemic respiratory failure Cords and arytenoid swollen upon re-intubation on 6/19. Likely secondary to decompensated CHF, pneumonia.  -Check cuff leak prior to extubation  -SAT/SBT daily - Failing due to increased WOB, tachycardia, and secretions -CPT -VAP prevention bundle -Peak pressures < 30 -Maintain SpO2 > 92%  Cardiac arrest, asystole/PEA Likely from respiratory arrest. -Continue supportive care -Avoid fevers and correct  electrolyte abnormalities  A-Fib On warfarin at home. Changed to heparin. Discontinued today with noted left flank bruising and possible bleeding. -Continue amiodarone, metoprolol -Hold off on anticoagulation following CT -Rates OK  ESRD on HD, MWF Hyperkalemia -Continue CRRT as per nephrology recommendations -Avoid nephrotoxins -With increasing pressor requirements,  would keep euvolemic on CRRT.  Shock, Sepsis vs. Hypovolemic vs. Cardiogenic POCUS with small-moderate pericardial effusion. RV and IVC does not appear under filled. Significant left flank ecchymosis concerning for RP hemorrhage. -Maintain MAP >65 with neo, vaso -Continue midodrine -Recultured and started back on broad spectrum abx. -CT Chest/Abd with contrast to evaluate for source of flank ecchymosis -Recheck H/H ASAP to evaluate for bleeding -Could check procalcitonin  Macrocytic anemia, Hgb 9.7 Thrombocytopenia secondary to critical illness Coagulopathy, INR 1.5 -Darbepoietin weekly -Recheck vitamin B12 and folate -Monitor for signs and symptoms of bleeding and transfuse for Hgb < 7  Hyperglycemia -Keep BG 140-180 -Continue SSI  Best practice:  Diet: TF Pain/Anxiety/Delirium protocol (if indicated): Fentanyl VAP protocol (if indicated): Yes DVT prophylaxis: SCD GI prophylaxis: PPI Glucose control: SSI Mobility: BR Code Status: Full Family Communication: Pending Disposition: ICU  Labs   CBC: Recent Labs  Lab 01/24/20 0453 01/25/20 0231 01/26/20 0331 01/27/20 0308 01/28/20 0306  WBC 14.4* 23.7* 19.3* 19.8* 23.1*  NEUTROABS 7.8* 12.2* 10.1* 10.5* 14.3*  HGB 7.5* 7.8* 7.8* 8.1* 9.7*  HCT 23.0* 24.1* 25.1* 26.1* 32.0*  MCV 123.7* 122.3* 128.1* 126.7* 127.5*  PLT 140* 142* 136* 126* 144*    Basic Metabolic Panel: Recent Labs  Lab 01/24/20 0453 01/24/20 1759 01/25/20 0231 01/25/20 1549 01/26/20 0331 01/26/20 1633 01/27/20 0308 01/27/20 1623 01/28/20 0306  NA 134*   < > 136   < > 137 137 137 137 137  K 4.6   < > 5.3*   < > 4.7 4.7 5.0 5.8* 5.3*  CL 100   < > 101   < > 102 100 101 100 98  CO2 25   < > 24   < > 25 27 26 22 23   GLUCOSE 140*   < > 190*   < > 139* 161* 148* 152* 169*  BUN 28*   < > 30*   < > 33* 36* 35* 34* 35*  CREATININE 1.57*   < > 1.61*   < > 1.66* 1.75* 1.74* 1.73* 1.63*  CALCIUM 9.7   < > 9.8   < > 10.3 10.3 10.1 10.3 10.9*  MG 2.5*   --  2.5*  --  2.6*  --  2.6*  --  2.7*  PHOS 2.6   < > 3.0   < > 3.2 3.5 2.9 4.7* 4.1   < > = values in this interval not displayed.   GFR: Estimated Creatinine Clearance: 30.5 mL/min (A) (by C-G formula based on SCr of 1.63 mg/dL (H)). Recent Labs  Lab 01/25/20 0231 01/26/20 0331 01/27/20 0308 01/28/20 0306  WBC 23.7* 19.3* 19.8* 23.1*    Liver Function Tests: Recent Labs  Lab 01/26/20 0331 01/26/20 1633 01/27/20 0308 01/27/20 1623 01/28/20 0306  ALBUMIN 2.6* 2.7* 2.6* 2.9* 3.2*   No results for input(s): LIPASE, AMYLASE in the last 168 hours. No results for input(s): AMMONIA in the last 168 hours.  ABG    Component Value Date/Time   PHART 7.326 (L) 02/04/2020 0836   PCO2ART 40.8 01/31/2020 0836   PO2ART 83 01/20/2020 0836   HCO3 21.3 01/26/2020 0836   TCO2 22 01/26/2020 0836   ACIDBASEDEF 4.0 (H)  01/21/2020 0836   O2SAT 95.0 01/31/2020 0836     Coagulation Profile: Recent Labs  Lab 01/24/20 0453 01/25/20 0231 01/26/20 0331 01/27/20 0308 01/28/20 0306  INR 1.3* 1.2 1.2 1.4* 1.5*    Cardiac Enzymes: No results for input(s): CKTOTAL, CKMB, CKMBINDEX, TROPONINI in the last 168 hours.  HbA1C: Hgb A1c MFr Bld  Date/Time Value Ref Range Status  01/28/2020 12:29 PM 5.3 4.8 - 5.6 % Final    Comment:    (NOTE) Pre diabetes:          5.7%-6.4%  Diabetes:              >6.4%  Glycemic control for   <7.0% adults with diabetes     CBG: Recent Labs  Lab 01/27/20 1944 01/27/20 2331 01/28/20 0355 01/28/20 0730 01/28/20 1121  GLUCAP 155* 178* 185* 170* 170*    Review of Systems:   Unable to obtain due to intubation sedation   Past Medical History  She,  has a past medical history of Anemia, Atrial fibrillation ([place]), Chest pain (83/66/2947), Diastolic heart failure ([place]), Diverticulosis, ESRD on hemodialysis ([place]), GERD (gastroesophageal reflux disease), Headache, Mixed hyperlipidemia, Peripheral neuropathy, PONV (postoperative nausea and vomiting),  Systemic hypertension, and Uterine cancer ([place]).   Surgical History    Past Surgical History:  Procedure Laterality Date   ABDOMINAL HYSTERECTOMY     APPENDECTOMY     AV FISTULA PLACEMENT Left 11/17/2013   Procedure: CREATION OF LEFT RADIOCEPHALIC ARTERIOVENOUS (AV) FISTULA ;  Surgeon: [name], MD;  Location: [facility] OR;  Service: Vascular;  Laterality: Left;   BREAST SURGERY Left    tumors non canerous 2- 3 different surgeries   CATARACT EXTRACTION W/PHACO Right 10/26/2016   Procedure: CATARACT EXTRACTION PHACO AND INTRAOCULAR LENS PLACEMENT (IOC) CDE= 11.91;  Surgeon: Tonny Branch, MD;  Location: [facility] ORS;  Service: Ophthalmology;  Laterality: Right;  right - pt knows to arrive at 6:30   CATARACT EXTRACTION W/PHACO Left 11/30/2016   Procedure: CATARACT EXTRACTION PHACO AND INTRAOCULAR LENS PLACEMENT (IOC) CDE - 7.92 ;  Surgeon: Tonny Branch, MD;  Location: [facility] ORS;  Service: Ophthalmology;  Laterality: Left;  left   CHOLECYSTECTOMY     COLONOSCOPY W/ BIOPSIES AND POLYPECTOMY     FISTULA SUPERFICIALIZATION Left 05/19/2014   Procedure: FISTULA SUPERFICIALIZATION-LEFT ARM;  Surgeon: [name], MD;  Location: [place];  Service: Vascular;  Laterality: Left;   INSERTION OF DIALYSIS CATHETER N/A 01/24/2014   Procedure: INSERTION OF DIALYSIS CATHETER;  Surgeon: Rosetta Posner, MD;  Location: [facility] OR;  Service: Vascular;  Laterality: N/A;   KNEE ARTHROSCOPY     TUBAL LIGATION       Social History   reports that she quit smoking about 20 years ago. She has never used smokeless tobacco. She reports that she does not drink alcohol and does not use drugs.   Family History   Her family history includes Cancer in her brother; Diabetes in her brother, father, mother, and sister; Heart attack in her brother and father; Heart disease in her brother and father; Hyperlipidemia in her sister; Hypertension in her brother, mother, and sister. There is no history of Colon cancer.   Allergies No  Known Allergies   Home Medications  Prior to Admission medications   Medication Sig Start Date End Date Taking? Authorizing Provider  acetaminophen (TYLENOL) 500 MG tablet Take 1,000 mg by mouth every 6 (six) hours as needed for moderate pain.    Yes [provider]  albuterol (VENTOLIN HFA) 108 (90 Base) MCG/ACT inhaler Inhale 2 puffs into the lungs every 4 (four) hours as needed for wheezing or shortness of breath (cough, shortness of breath or wheezing.). 04/25/19  Yes Johnson, Clanford L, MD  Biotin (BIOTIN MAXIMUM STRENGTH) 5 MG CAPS Take 5 mg by mouth daily.   Yes [provider]  gabapentin (NEURONTIN) 100 MG capsule Take 100 mg by mouth 2 (two) times daily.  09/13/18  Yes [provider]  guaiFENesin (MUCINEX) 600 MG 12 hr tablet Take 1 tablet (600 mg total) by mouth 2 (two) times daily. 09/30/19  Yes Emokpae, Courage, MD  levothyroxine (SYNTHROID) 75 MCG tablet Take 75 mcg by mouth daily. 01/07/20  Yes [provider]  metoprolol tartrate (LOPRESSOR) 25 MG tablet Take 1 tablet (25 mg total) by mouth 2 (two) times daily. 09/30/19  Yes Emokpae, Courage, MD  midodrine (PROAMATINE) 5 MG tablet Take 1 tablet (5 mg total) by mouth 3 (three) times daily with meals. 09/30/19  Yes Emokpae, Courage, MD  Multiple Vitamins-Minerals (PRESERVISION AREDS 2 PO) Take 1 tablet by mouth 2 (two) times daily.   Yes [provider]  multivitamin (RENA-VIT) TABS tablet Take 1 tablet by mouth every evening.  07/06/18  Yes [provider]  Omega-3 Fatty Acids (OMEGA-3 FISH OIL) 300 MG CAPS Take 300 mg by mouth 2 (two) times daily.   Yes [provider]  omeprazole (PRILOSEC OTC) 20 MG tablet Take 20 mg by mouth daily.   Yes [provider]  Propylene Glycol (SYSTANE BALANCE) 0.6 % SOLN Place 1 drop into both eyes 3 (three) times daily as needed (for burning or dry eyes).    Yes [provider]  RENVELA 800 MG tablet Take 2,400 mg by mouth 3  (three) times daily with meals. Takes 1 tablet with snacks 04/17/15  Yes [provider]  warfarin (COUMADIN) 5 MG tablet TAKE 1 & 1/2 (ONE & ONE-HALF) TABLETS  TO  2  TABLETS  BY MOUTH ONCE  DAILY  AS  DIRECTED  BY  COUMADIN  CLINIC Patient taking differently: Take 5-10 mg by mouth See admin instructions. TAKE  2  TABLETS  (10mg )  BY MOUTH ONCE  DAILY  AS  DIRECTED  BY  COUMADIN  CLINIC Except 1 tablet (5mg ) on Friday. 11/24/19  Yes Duke, Tami Lin, PA  fluticasone (FLOVENT HFA) 44 MCG/ACT inhaler Inhale 2 puffs into the lungs 2 (two) times daily. Patient not taking: Reported on 11/25/2019 04/25/19 04/24/20  Murlean Iba, MD  pantoprazole (PROTONIX) 40 MG tablet Take 1 tablet (40 mg total) by mouth daily before breakfast. Patient not taking: Reported on 01/22/2020 09/30/19   Roxan Hockey, MD     Critical care time: 60 minutes   Electronically signed by Myrle Sheng, [place], 01/28/20, 1438

## 2020-01-28 NOTE — Progress Notes (Signed)
  Echocardiogram 2D Echocardiogram has been performed.  Sabrina Beck 01/28/2020, 2:45 PM

## 2020-01-28 NOTE — Progress Notes (Signed)
NAME:  Sabrina Beck, MRN:  [id number], DOB:  1940/12/08, LOS: 9 ADMISSION DATE:  01/29/2020, CONSULTATION DATE: 6/14 REFERRING MD: Dr. Vanita Panda EDP, CHIEF COMPLAINT: Cardiac arrest  Brief History   79 year old female with ESRD on HD presented to [facility] emergency department 6/14 with respiratory distress and suffered cardiac arrest immediately upon arrival.  Brief arrest and responsive after.  Intubated during arrest and transferred to [facility] ED for ICU admission.  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  She has been compliant with dialysis and has reportedly not missed a treatment.  Last treatment 4/11.  In the morning hours of 6/14 when her son who she lives with attempted to wake her up for dialysis she was difficult to arouse and complained of trouble breathing.  He transported her to the emergency department and she became unresponsive as they pulled into the parking lot.  ED staff immediately realized she was pulseless and began resuscitative efforts.  Presenting rhythm was asystole.  The patient underwent what sounds like 2 rounds of CPR including epinephrine, atropine, and hyperkalemia treatment.  There was concern for STEMI based on rhythm change however, EKG was reviewed by cardiology who was not quite as concerned.  The patient was transferred to [facility] emergency department with plans for admission to ICU when bed became available.  In [facility] emergency department the patient was awake on the ventilator and able to respond to nursing.  She was given given sedation for vent comfort.  She was found to be hypotensive and was started on phenylephrine IV infusion. PCCM called for admission.   Past Medical History   has a past  medical history of Anemia, Atrial fibrillation ([place]), Chest pain (97/67/3419), Diastolic heart failure ([place]), Diverticulosis, ESRD on hemodialysis ([place]), GERD (gastroesophageal reflux disease), Headache, Mixed hyperlipidemia, Peripheral neuropathy, PONV (postoperative nausea and vomiting), Systemic hypertension, and Uterine cancer ([place]).   Significant Hospital Events   6/14 admit for cardiac arrest 6/19 failed extubation attempt  Consults:  Nephrology  Procedures:  ETT 6/14 > 6/19. 6/19 >  CVL 6/14 >  Significant Diagnostic Tests:  CXR on presentation: R>L infiltrate which is evolving   Micro Data:  Blood 6/14 > Tracheal aspirate 6/21 > rare WBC, rare budding yeast  Antimicrobials:  Cefepime 6/14 > 6/22 Vancomycin 6/14 >  Interim history/subjective:  Ongoing increased respiratory secretions requiring aggressive suctioning. Increased HR despite amiodarone addition. Very labile BPs.  Objective   Blood pressure 110/63, pulse (!) 128, temperature 98.4 F (36.9 C), temperature source Axillary, resp. rate 15, height 5\' 4"  (1.626 m), weight 93.3 kg, SpO2 100 %.    Vent Mode: PSV;CPAP FiO2 (%):  [40 %] 40 % Set Rate:  [24 bmp] 24 bmp Vt Set:  [430 mL] 430 mL PEEP:  [5 cmH20] 5 cmH20 Pressure Support:  [8 cmH20] 8 cmH20 Plateau Pressure:  [18 cmH20-24 cmH20] 24 cmH20   Intake/Output Summary (Last 24 hours) at 01/28/2020 3790 Last data filed at 01/28/2020 0900 Gross per 24 hour  Intake 3046.71 ml  Output 4602 ml  Net -1555.29 ml   Filed Weights   01/26/20 0400 01/27/20 0500 01/28/20 0500  Weight: 96.1 kg 93.3 kg 93.3 kg    Examination: General: elderly woman intubated, sedated, critically- ill appearing. HENT: [place]/AT,  eyes anicteric, oral mucosa dry. Lungs: R rhonchi, thicker mildly bloody secretions from ETT. Using accessory muscles on SBT. Weak cough. Cardiovascular: irreg irreg rhythm, tachycardic Abdomen: soft, NT, ND Extremities: no c/c/e Neuro: RASS 0, weak  globally but following some commands in all extremities. Not tracking, but eyes open. Derm: bruising on L flank and left lateral breast  US bedside: Reduced LV septal wall motion compared to lateral wall, possible pericardial effusion. RA not underfilled, RV hypercontractile. Tiny dependent left effusion.   CXR 6/23 personally reviewed: no opacity, silhouetting of left lateral hemidiaphragm. Not significantly different than 6/19 CXR. LIJ CVC and ETT in appropriate position  Assessment & Plan:   Acute hypoxemic respiratory failure due to RLL community-acquired pneumonia: History of multiple pneumonias recently and has a R sided infiltrate on CXR.  Attempted extubation to high flow nasal cannula today given increasing agitation and minimal ventilator requirements but promptly failed and required re-intubation.  Cords and arytenoid swollen on reintubation. -Con't LTVV, goal Pplat<30 and driving pressure <25 -Daily SAT and SBT- failing due to increased WOB, tachycardia, secretions -Needs empiric steroids prior to next extubation attempt -CPT -Sedation as required to maintain vent tolerance -VAP Prevention protocol  Critically ill due to shock- septic vs hypovolemic vs cardiogenic.   -Continue pressors to maintain MAP greater than 65 - Continue midodrine. -start stress dose steroids -repeat trach aspirate, blood cultures & broaden antibiotics -needs A-line for invasive monitoring given lability -echo -CTA c/a/p to evaluate for possible sources of bleeding  Cardiac arrest: PEA/Asystole. Awake and following commands on arrival to ED. Likely a primary respiratory arrest. ROSC with 2 rounds CPR.  -Continue supportive care, monitor for neuro recovery -avoid fevers, electrolyte abnormalities  ESRD on HD hyperkalemia -Continue CRRT -Renally dose meds and avoid nephrotoxic meds  Atrial fibrillation with RVR: on warfarin, metoprolol at home. -Continue PTA metoprolol -con't  amiodarone -consider adding digoxin -agree with limiting UF -holding heparin due to concern for bleeding; holding warfarin until more stable  Anemia of chronic disease Thrombocytopenia likely 2/2 acute illness -Continue to monitor -Transfuse for hemoglobin <7 or hemodynamically significant bleeding-- depending on CT results may need transfusion but echo does not support hypovolemic shock.  DM with hyperglycemia, mostly controlled -adding 1 unit novolog q4h TF coverage with hold parameters -ssi PRN -goal BG 140-180 while admtited  Best practice:  Diet: NPO - tube feeds Pain/Anxiety/Delirium protocol (if indicated): Fentanyl infusion, started enteral quetiapine VAP protocol (if indicated): Bundle in place DVT prophylaxis: heparin gtt on hold GI prophylaxis: PPI Glucose control: SSI Mobility: BR Code Status: FULL (has been DNR in the past), son at bedside endorses full code.  Family Communication: Updated son [name] by phone Disposition: ICU    This patient is critically ill with multiple organ system failure which requires frequent high complexity decision making, assessment, support, evaluation, and titration of therapies. This was completed through the application of advanced monitoring technologies and extensive interpretation of multiple databases. During this encounter critical care time was devoted to patient care services described in this note for 47 minutes.   Julian Hy, DO 01/28/20 10:46 AM [facility]

## 2020-01-28 NOTE — Plan of Care (Signed)
Sons Sabrina Beck and Sabrina Beck updated at bedside. We reviewed her CT scan and her overall care and prognosis. They understand that this overall worsens her prognosis, which was already concerning with prolonged vent weaning that has been ongoing.  They understand that chest compressions and resuscitation in the event of a cardiac arrest would be likely more harmful than helpful. Code status changed to DNR. They will discuss what they think she would want in a case such as this. Thurmond Butts, RN present during update.  Julian Hy, DO 01/28/20 3:58 PM  Pulmonary & Critical Care

## 2020-01-28 NOTE — Progress Notes (Signed)
[facility] Progress Note Patient Name: Sabrina Beck DOB: 1940/11/26 MRN: [id number]   Date of Service  01/28/2020  HPI/Events of Note  Frequent loose stools - Request for Flexiseal.   eICU Interventions  Will order Flexiseal.      Intervention Category Major Interventions: Other:  Lysle Dingwall 01/28/2020, 5:51 AM

## 2020-01-28 NOTE — Progress Notes (Signed)
Patient has order to maintain SBP<120 and HR<60. Attempting to maintain SBP<120 has been unsuccessful as MAP drops below 65 when SBP<120. HR also has not gone below 60 despite Lopressor and Digoxin. Notified E-link Dr. Lucile Shutters, per Dr. Lucile Shutters target SBP<120 as primary goal with MAP>65 as secondary goal. Per Dr. Lucile Shutters continue to monitor heart rate as this time with further Digoxin administrations.

## 2020-01-28 NOTE — Procedures (Signed)
Arterial Catheter Insertion Procedure Note NEVIN KOZUCH [id number] 02-08-1941  Procedure: Insertion of Arterial Catheter  Indications: Blood pressure monitoring  Procedure Details Consent: Risks of procedure as well as the alternatives and risks of each were explained to the (patient/caregiver).  Consent for procedure obtained. Time Out: Verified patient identification, verified procedure, site/side was marked, verified correct patient position, special equipment/implants available, medications/allergies/relevent history reviewed, required imaging and test results available.  Performed  Maximum sterile technique was used including antiseptics, cap, gloves, hand hygiene, mask and sheet. Skin prep: Chlorhexidine 20 gauge catheter was inserted into right radial artery using the Seldinger technique. ULTRASOUND GUIDANCE USED: YES Evaluation Blood flow good; BP tracing good. Complications: No apparent complications.  Eliseo Gum MSN, AGACNP-BC [place] [phone] If no answer, [phone] 01/28/2020, 11:14 AM

## 2020-01-28 NOTE — Care Plan (Addendum)
Notified by radiology of aortic dissection, pericardial effusion, fractured ribs and sternum.  STAT consult to [facility]CTS called in. Heparin has been on hold.  Julian Hy, DO 01/28/20 1:50 PM [place]     I called her son [name] to discuss her CT scan findings. I let him know that [facility] will evaluate her, but I am concerned that this drastically changes her prognosis. I have recommended that he and his brother come to the hospital.  Echo being performed at bedside currently.  Julian Hy, DO 01/28/20 2:16 PM [facility]

## 2020-01-29 ENCOUNTER — Encounter (HOSPITAL_COMMUNITY): Payer: Self-pay | Admitting: Pulmonary Disease

## 2020-01-29 DIAGNOSIS — I7101 Dissection of thoracic aorta: Secondary | ICD-10-CM

## 2020-01-29 DIAGNOSIS — R0603 Acute respiratory distress: Secondary | ICD-10-CM

## 2020-01-29 DIAGNOSIS — J156 Pneumonia due to other aerobic Gram-negative bacteria: Secondary | ICD-10-CM

## 2020-01-29 DIAGNOSIS — I7102 Dissection of abdominal aorta: Secondary | ICD-10-CM

## 2020-01-29 DIAGNOSIS — Z992 Dependence on renal dialysis: Secondary | ICD-10-CM

## 2020-01-29 DIAGNOSIS — Z7189 Other specified counseling: Secondary | ICD-10-CM

## 2020-01-29 DIAGNOSIS — N186 End stage renal disease: Secondary | ICD-10-CM

## 2020-01-29 LAB — CBC WITH DIFFERENTIAL/PLATELET
Abs Immature Granulocytes: 0 10*3/uL (ref 0.00–0.07)
Basophils Absolute: 0 10*3/uL (ref 0.0–0.1)
Basophils Relative: 0 %
Eosinophils Absolute: 0.2 10*3/uL (ref 0.0–0.5)
Eosinophils Relative: 1 %
HCT: 30.8 % — ABNORMAL LOW (ref 36.0–46.0)
Hemoglobin: 9.9 g/dL — ABNORMAL LOW (ref 12.0–15.0)
Lymphocytes Relative: 5 %
Lymphs Abs: 1.1 10*3/uL (ref 0.7–4.0)
MCH: 35.1 pg — ABNORMAL HIGH (ref 26.0–34.0)
MCHC: 32.1 g/dL (ref 30.0–36.0)
MCV: 109.2 fL — ABNORMAL HIGH (ref 80.0–100.0)
Monocytes Absolute: 2.3 10*3/uL — ABNORMAL HIGH (ref 0.1–1.0)
Monocytes Relative: 10 %
Neutro Abs: 19.1 10*3/uL — ABNORMAL HIGH (ref 1.7–7.7)
Neutrophils Relative %: 84 %
Platelets: 151 10*3/uL (ref 150–400)
RBC: 2.82 MIL/uL — ABNORMAL LOW (ref 3.87–5.11)
WBC: 22.7 10*3/uL — ABNORMAL HIGH (ref 4.0–10.5)
nRBC: 1 /100 WBC — ABNORMAL HIGH
nRBC: 1.2 % — ABNORMAL HIGH (ref 0.0–0.2)

## 2020-01-29 LAB — RENAL FUNCTION PANEL
Albumin: 2.6 g/dL — ABNORMAL LOW (ref 3.5–5.0)
Albumin: 2.5 g/dL — ABNORMAL LOW (ref 3.5–5.0)
Anion gap: 11 (ref 5–15)
Anion gap: 14 (ref 5–15)
BUN: 33 mg/dL — ABNORMAL HIGH (ref 8–23)
BUN: 31 mg/dL — ABNORMAL HIGH (ref 8–23)
CO2: 22 mmol/L (ref 22–32)
CO2: 23 mmol/L (ref 22–32)
Calcium: 10.6 mg/dL — ABNORMAL HIGH (ref 8.9–10.3)
Calcium: 10.8 mg/dL — ABNORMAL HIGH (ref 8.9–10.3)
Chloride: 102 mmol/L (ref 98–111)
Chloride: 99 mmol/L (ref 98–111)
Creatinine, Ser: 1.47 mg/dL — ABNORMAL HIGH (ref 0.44–1.00)
Creatinine, Ser: 1.52 mg/dL — ABNORMAL HIGH (ref 0.44–1.00)
GFR calc Af Amer: 39 mL/min — ABNORMAL LOW (ref >60)
GFR calc Af Amer: 37 mL/min — ABNORMAL LOW (ref >60)
GFR calc non Af Amer: 33 mL/min — ABNORMAL LOW (ref >60)
GFR calc non Af Amer: 32 mL/min — ABNORMAL LOW (ref >60)
Glucose, Bld: 219 mg/dL — ABNORMAL HIGH (ref 70–99)
Glucose, Bld: 214 mg/dL — ABNORMAL HIGH (ref 70–99)
Phosphorus: 4.1 mg/dL (ref 2.5–4.6)
Phosphorus: 3.8 mg/dL (ref 2.5–4.6)
Potassium: 4.5 mmol/L (ref 3.5–5.1)
Potassium: 3.8 mmol/L (ref 3.5–5.1)
Sodium: 135 mmol/L (ref 135–145)
Sodium: 136 mmol/L (ref 135–145)

## 2020-01-29 LAB — BPAM RBC
Blood Product Expiration Date: 202107122359
Blood Product Expiration Date: 202107132359
ISSUE DATE / TIME: 202106231603
ISSUE DATE / TIME: 202106231800
Unit Type and Rh: 9500
Unit Type and Rh: 9500

## 2020-01-29 LAB — MAGNESIUM: Magnesium: 2.6 mg/dL — ABNORMAL HIGH (ref 1.7–2.4)

## 2020-01-29 LAB — GLUCOSE, CAPILLARY
Glucose-Capillary: 197 mg/dL — ABNORMAL HIGH (ref 70–99)
Glucose-Capillary: 196 mg/dL — ABNORMAL HIGH (ref 70–99)
Glucose-Capillary: 214 mg/dL — ABNORMAL HIGH (ref 70–99)
Glucose-Capillary: 215 mg/dL — ABNORMAL HIGH (ref 70–99)
Glucose-Capillary: 175 mg/dL — ABNORMAL HIGH (ref 70–99)
Glucose-Capillary: 201 mg/dL — ABNORMAL HIGH (ref 70–99)

## 2020-01-29 LAB — APTT: aPTT: 39 seconds — ABNORMAL HIGH (ref 24–36)

## 2020-01-29 LAB — TYPE AND SCREEN
ABO/RH(D): O NEG
Antibody Screen: NEGATIVE
Unit division: 0
Unit division: 0

## 2020-01-29 LAB — PROTIME-INR
INR: 1.3 — ABNORMAL HIGH (ref 0.8–1.2)
Prothrombin Time: 15.7 seconds — ABNORMAL HIGH (ref 11.4–15.2)

## 2020-01-29 MED ORDER — INSULIN ASPART 100 UNIT/ML ~~LOC~~ SOLN: 2.0000 [IU] | SUBCUTANEOUS | Status: DC

## 2020-01-29 MED ORDER — LIP MEDEX EX OINT
TOPICAL_OINTMENT | CUTANEOUS | Status: DC | PRN
  Filled 2020-01-29: qty 7

## 2020-01-29 MED ORDER — INSULIN ASPART 100 UNIT/ML ~~LOC~~ SOLN
3.0000 [IU] | SUBCUTANEOUS | Status: DC
  Administered 2020-01-29 – 2020-01-30 (×5): 3 [IU] via SUBCUTANEOUS

## 2020-01-29 NOTE — Progress Notes (Signed)
[facility] Progress Note  Subjective: I/O -   Running even with CRRT-  No major change-  Still on pretty high dose pressors.  Chest CT did reveal aortic dissection and pericardial effusion-  Made DNR   Vitals:   01/29/20 0620 01/29/20 8638 01/29/20 0635 01/29/20 0640  BP:  94/79 94/79   Pulse: 85 89 82 73  Resp: (!) 21 (!) 24 (!) 24 (!) 24  Temp:      TempSrc:      SpO2: 95% 90% 93% 92%  Weight:      Height:        Exam: Gen on vent, arouses and responds appropriately- -  left IJ TDC in for 9 days No jvd or bruits Chest occ rhonchi, improved, no wheezing RRR no MRG Abd soft ntnd no mass or ascites +bs Ext pitting edema to dep areas Neuro responsive LFA AVF+bruit      OP HD: [facility]   3.5h  2/2.5 bath  97.5kg  No heparin  LFA AVF  - EPO 6600 q Rx   Assessment/ Plan: 1. Cardiac arrest - resuscitated in ED-  Is awake 2. Sepsis/ shock/ PNA - getting IV abx, still requiring pressor support. Getting midodrine at 10 tid. Hx prior admits for PNA. Not making much progress last few days 3. Acute resp failure - on vent,  CXR w persistent edema the other day, UF as able-  Had prompt failure of extubation over the weekend-  4. ESRD - on HD MWF.  CRRT started on 6/15 due to shock. Continue. Using 2K pre filter and dialysate, post filter 4K.   Need to continue CRRT as req pressors  - IJ vascath 35 days old-  AVF is patent 5. BP/ volume -  successful UF but needed to go up on pressors-  Now running even 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 Ca up slightly. P repleted 6/22 7. Anemia ckd - Hb 9.9 today, transfuse prn.  darbe 100ug  weekly while here. Tsat 89%, no need for IV Fe. Have increased ESA this week 8. Hyperkalemia - improved with change in dialysate 9. Atrial fibrillation - getting metoprolol 25 bid for rate control 10. Hypercalcemia-  Developed in house-  Maybe due to being sedentary-  No action yet     Sabrina Beck  01/29/2020, 6:49  AM   Recent Labs  Lab 01/28/20 0306 01/28/20 1649 01/28/20 2309 01/29/20 0257  K  --  5.1  --  4.5  BUN  --  33*  --  33*  CREATININE  --  1.55*  --  1.47*  CALCIUM  --  10.2  --  10.6*  PHOS  --  4.7*  --  4.1  HGB   < >  --  10.2* 9.9*   < > = values in this interval not displayed.   Inpatient medications: . B-complex with vitamin C  1 tablet Per Tube Daily  . chlorhexidine gluconate (MEDLINE KIT)  15 mL Mouth Rinse BID  . Chlorhexidine Gluconate Cloth  6 each Topical Daily  . darbepoetin (ARANESP) injection - DIALYSIS  100 mcg Intravenous Q Wed-HD  . feeding supplement (PRO-STAT SUGAR FREE 64)  30 mL Per Tube BID  . fludrocortisone  0.1 mg Per Tube Daily  . hydrocortisone sod succinate (SOLU-CORTEF) inj  50 mg Intravenous Q6H  . insulin aspart  0-6 Units Subcutaneous Q4H  . insulin aspart  1 Units Subcutaneous Q4H  . ipratropium-albuterol  3  mL Nebulization BID  . levothyroxine  75 mcg Per Tube Q0600  . mouth rinse  15 mL Mouth Rinse 10 times per day  . metoprolol tartrate  25 mg Per Tube BID  . midodrine  10 mg Per Tube TID  . pantoprazole sodium  40 mg Per Tube Q1200  . QUEtiapine  50 mg Per Tube BID  . sodium chloride flush  10-40 mL Intracatheter Q12H   .  prismasol BGK 4/2.5 200 mL/hr at 01/29/20 0003  . sodium chloride Stopped (01/29/20 0531)  . amiodarone 30 mg/hr (01/29/20 0600)  . feeding supplement (VITAL AF 1.2 CAL) 55 mL/hr at 01/28/20 2300  . fentaNYL infusion INTRAVENOUS 25 mcg/hr (01/29/20 0600)  . meropenem (MERREM) IV 200 mL/hr at 01/29/20 0600  . phenylephrine (NEO-SYNEPHRINE) Adult infusion 160 mcg/min (01/29/20 0600)  . prismasol BGK 2/2.5 dialysis solution 1,500 mL/hr at 01/29/20 0419  . prismasol BGK 2/2.5 replacement solution 400 mL/hr at 01/28/20 2358  . vancomycin    . vasopressin (PITRESSIN) infusion - *FOR SHOCK* 0.03 Units/min (01/29/20 0600)   sodium chloride, alteplase, docusate, fentaNYL, heparin, midazolam, polyethylene glycol, sodium  chloride, sodium chloride flush

## 2020-01-29 NOTE — Progress Notes (Signed)
Notified [facility] that Phenylephrine was now at 327mcg/min and was requiring rapid up titration to maintain arterial line MAP>65, NIBP MAP currently 77.

## 2020-01-29 NOTE — Progress Notes (Signed)
Spoke with Dr. Lucile Shutters with [facility], per Dr. Lucile Shutters if MAP is dropping below 60 when SBP decreases okay to maintain SBP at 130 if it helps keep the MAP above 60.

## 2020-01-29 NOTE — Progress Notes (Cosign Needed)
NOT OFFICIAL PART OF MEDICAL RECORD - FOR EDUCATIONAL USE ONLY  NOT OFFICIAL PART OF MEDICAL RECORD - FOR EDUCATIONAL USE ONLY  NAME:  Sabrina Beck, MRN:  [id number], DOB:  05-16-1941, LOS: 61 ADMISSION DATE:  01/22/2020, CONSULTATION DATE:  01/26/2020 REFERRING MD:  EDP, CHIEF COMPLAINT:  Cardiac arrest   Brief History   79 year old female with ESRD on HD who was found hypoxic and difficult to arouse on 6/14 by her son. She was brought to the ED where she suffered a cardiac arrest upon arrival. She was transferred to ICU for further management.   History of present illness   Sabrina Beck is an 79 year old female with a PMH significant for atrial fibrillation on warfarin, HFpEF, and ESRD on HD (MWF) who presented on 6/14 when she was found to be hypoxic and difficult to arouse by her son. She was brought to the ED where she suffered a cardiac arrest immediately upon arrival. Initial presenting rhythm was asystole. She received 2 rounds of CPR with epinephrine, atropine, and treatment for hyperkalemia with ROSC. She was transferred to [facility] ICU to be managed by PCCM.  Sabrina Beck has demonstrated failure to wean from the ventilator for the last ten days. 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 Per CTS, she is not a candidate for operative repair and has been recommended for comfort measures.  Past Medical History  Atrial fibrillation HFpEF Diverticulosis ESRD on HD (MWF) GERD Hyperlipidemia HTN Uterine CA  Significant Hospital Events   6/14 Admitted for cardiac arrest 6/23 Found to have acute aortic dissection. CTS consulted, non-op mgt  Consults:  Nephrology CTS  Procedures:  ETT 6/14 > 6/19. 6/19 > [facility] 6/14 >  Significant Diagnostic Tests:  6/23 CT Chest/Abd with contrast >> Acute aortic dissections extending from distal arch through descending thoracic aorta into the suprarenal abdominal  aorta with intramural hemorrhage and small pericardial effusion. 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 Complete left lower love atelectasis and mild ground-glass opacifications throughout both lungs.   6/23 CXR >> LIJ HD catheter in place. ETT in place. Improved pulmonary edema, small bilateral pleural effusions. LLL atelectasis vs infiltrate  6/14 CT Head >> No acute abnormalities  Micro Data:  6/23 Respiratory culture >> Moderate gram negative rods  6/23 Blood culture >> NGTD 6/14 Blood culture >> NGTD  6/14 MRSA PCR >> Negative  6/14 SARS CoV2 >> Negative Antimicrobials:  Vancomycin 6/14 >> 6/15; 6/23 >> Meropenem 6/23 >> Cefepime 6/14 >> 6/22  Interim history/subjective:  [name] has exhibited failure to wean from the ventilator for the last ten days.  Yesterday, she was sent for CT Chest/Abd with contrast to evaluate for potential source of flank ecchymosis. She was found to have aortic dissection from distal arch to descending thoracic aorta into suprarenal abdominal aorta.  Afebrile overnight with some episodes of hypotension and also difficulty in maintaining SBP < 120 with concomitant HR < 60.  Objective   Blood pressure (!) 141/35, pulse 89, temperature 98.9 F (37.2 C), temperature source Axillary, resp. rate 19, height 5\' 4"  (1.626 m), weight 91.8 kg, SpO2 100 %.    Vent Mode: PSV;CPAP FiO2 (%):  [30 %-40 %] 40 % Set Rate:  [24 bmp] 24 bmp Vt Set:  [430 mL] 430 mL PEEP:  [5 cmH20] 5 cmH20 Pressure Support:  [8 cmH20] 8 cmH20 Plateau Pressure:  [  21 cmH20] 21 cmH20   Intake/Output Summary (Last 24 hours) at 01/29/2020 4888 Last data filed at 01/29/2020 0800 Gross per 24 hour  Intake 3893.44 ml  Output 3831 ml  Net 62.44 ml   Filed Weights   01/27/20 0500 01/28/20 0500 01/29/20 0500  Weight: 93.3 kg 93.3 kg 91.8 kg    Examination: General: Frail appearing elderly female laying in bed in no obvious distress on  MV HENT: ETT in place. Cotrak in place infusing TF> No scleral icterus. EOMI. MMM Lungs: No accessory muscle use. Rhonchi throughout bilaterally. Equal chest rise and fall on MV Cardiovascular: Irregular rhythm, AF monitor. HR 90. Abdomen: Bowel sounds present. Non tender. Not distended Neuro: RASS 0. Follows commands. Nods and shakes head appropriately. Eyes open spontaenously Integ: Left flank ecchymosis. Skin cool and dry.  Resolved Hospital Problem list    Assessment & Plan:  Acute hypoxemic respiratory failure Pneumonia Cords and arytenoid swollen upon re-intubation on 6/19. Likely secondary to decompensated CHF, pneumonia.   -Check cuff leak prior to extubation  -SAT/SBT daily - Failing due to increased WOB, tachycardia, and secretions -CPT -VAP prevention bundle -Peak pressures < 30 -Maintain SpO2 > 92% -Given sputum culture, and MRSA PCR (-) would discontinue vanco and continue meropenem  A-Fib On warfarin at home. Discontinued heparin given acute dissection  -Continue amiodarone, metoprolol -Hold off on anticoagulation following CT   Acute aortic dissection CTS consulted and not operative candidate. Medical management with strict blood pressure and HR control.  -SBP < 120. MAP > 65 -HR < 60 -Wean vasopressors to above goals -Recommend palliative involvement and discussion with family regarding GOC.  Cardiac arrest, asystole/PEA -Continue supportive care -Avoid fevers and correct electrolyte abnormalities  ESRD on HD, MWF Hyperkalemia -Continue CRRT as per nephrology recommendations -Avoid nephrotoxins -With increasing pressor requirements, would keep euvolemic on CRRT.  Macrocytic anemia, Hgb 9.9 Thrombocytopenia secondary to critical illness Coagulopathy -Darbepoietin weekly -Recheck vitamin B12 and folate -Monitor for signs and symptoms of bleeding and transfuse for Hgb < 7  Hyperglycemia -Keep BG 140-180 -Increase SSI   Given inability to wean  from MV in setting of respiratory failure, pneumonia, and new acute aortic dissection, would recommend GOC discussion with family    Best practice:  Diet: TF Pain/Anxiety/Delirium protocol (if indicated): Fentanyl VAP protocol (if indicated): Yes DVT prophylaxis: SCD GI prophylaxis: PPI Glucose control: SSI Mobility: BR Code Status: Full Family Communication: Pending Disposition: ICU  Labs   CBC: Recent Labs  Lab 01/25/20 0231 01/25/20 0231 01/26/20 0331 01/27/20 0308 01/28/20 0306 01/28/20 2309 01/29/20 0257  WBC 23.7*  --  19.3* 19.8* 23.1*  --  22.7*  NEUTROABS 12.2*  --  10.1* 10.5* 14.3*  --  19.1*  HGB 7.8*   < > 7.8* 8.1* 9.7* 10.2* 9.9*  HCT 24.1*   < > 25.1* 26.1* 32.0* 31.2* 30.8*  MCV 122.3*  --  128.1* 126.7* 127.5*  --  109.2*  PLT 142*  --  136* 126* 144*  --  151   < > = values in this interval not displayed.    Basic Metabolic Panel: Recent Labs  Lab 01/25/20 0231 01/25/20 1549 01/26/20 0331 01/26/20 1633 01/27/20 0308 01/27/20 1623 01/28/20 0306 01/28/20 1649 01/29/20 0257  NA 136   < > 137   < > 137 137 137 134* 135  K 5.3*   < > 4.7   < > 5.0 5.8* 5.3* 5.1 4.5  CL 101   < > 102   < >  101 100 98 98 102  CO2 24   < > 25   < > 26 22 23 22 22   GLUCOSE 190*   < > 139*   < > 148* 152* 169* 205* 219*  BUN 30*   < > 33*   < > 35* 34* 35* 33* 33*  CREATININE 1.61*   < > 1.66*   < > 1.74* 1.73* 1.63* 1.55* 1.47*  CALCIUM 9.8   < > 10.3   < > 10.1 10.3 10.9* 10.2 10.6*  MG 2.5*  --  2.6*  --  2.6*  --  2.7*  --  2.6*  PHOS 3.0   < > 3.2   < > 2.9 4.7* 4.1 4.7* 4.1   < > = values in this interval not displayed.   GFR: Estimated Creatinine Clearance: 33.5 mL/min (A) (by C-G formula based on SCr of 1.47 mg/dL (H)). Recent Labs  Lab 01/26/20 0331 01/27/20 0308 01/28/20 0306 01/29/20 0257  WBC 19.3* 19.8* 23.1* 22.7*    Liver Function Tests: Recent Labs  Lab 01/27/20 0308 01/27/20 1623 01/28/20 0306 01/28/20 1649 01/29/20 0257  ALBUMIN  2.6* 2.9* 3.2* 2.4* 2.6*   No results for input(s): LIPASE, AMYLASE in the last 168 hours. No results for input(s): AMMONIA in the last 168 hours.  ABG    Component Value Date/Time   PHART 7.326 (L) 01/26/2020 0836   PCO2ART 40.8 01/12/2020 0836   PO2ART 83 01/27/2020 0836   HCO3 21.3 01/18/2020 0836   TCO2 22 01/24/2020 0836   ACIDBASEDEF 4.0 (H) 01/25/2020 0836   O2SAT 95.0 01/09/2020 0836     Coagulation Profile: Recent Labs  Lab 01/25/20 0231 01/26/20 0331 01/27/20 0308 01/28/20 0306 01/29/20 0257  INR 1.2 1.2 1.4* 1.5* 1.3*    Cardiac Enzymes: No results for input(s): CKTOTAL, CKMB, CKMBINDEX, TROPONINI in the last 168 hours.  HbA1C: Hgb A1c MFr Bld  Date/Time Value Ref Range Status  01/27/2020 12:29 PM 5.3 4.8 - 5.6 % Final    Comment:    (NOTE) Pre diabetes:          5.7%-6.4%  Diabetes:              >6.4%  Glycemic control for   <7.0% adults with diabetes     CBG: Recent Labs  Lab 01/28/20 1518 01/28/20 1919 01/28/20 2318 01/29/20 0311 01/29/20 0737  GLUCAP 190* 168* 195* 197* 196*    Review of Systems:   Unable to obtain due to intubation sedation   Past Medical History  She,  has a past medical history of Anemia, Atrial fibrillation ([place]), Chest pain (88/41/6606), Diastolic heart failure ([place]), Diverticulosis, ESRD on hemodialysis ([place]), GERD (gastroesophageal reflux disease), Headache, Mixed hyperlipidemia, Peripheral neuropathy, PONV (postoperative nausea and vomiting), Systemic hypertension, and Uterine cancer ([place]).   Surgical History    Past Surgical History:  Procedure Laterality Date  . ABDOMINAL HYSTERECTOMY    . APPENDECTOMY    . AV FISTULA PLACEMENT Left 11/17/2013   Procedure: CREATION OF LEFT RADIOCEPHALIC ARTERIOVENOUS (AV) FISTULA ;  Surgeon: [name], MD;  Location: [place];  Service: Vascular;  Laterality: Left;  . BREAST SURGERY Left    tumors non canerous 2- 3 different surgeries  . CATARACT EXTRACTION W/PHACO  Right 10/26/2016   Procedure: CATARACT EXTRACTION PHACO AND INTRAOCULAR LENS PLACEMENT (IOC) CDE= 11.91;  Surgeon: Tonny Branch, MD;  Location: [facility];  Service: Ophthalmology;  Laterality: Right;  right - pt knows to arrive at 6:30  .  CATARACT EXTRACTION W/PHACO Left 11/30/2016   Procedure: CATARACT EXTRACTION PHACO AND INTRAOCULAR LENS PLACEMENT (IOC) CDE - 7.92 ;  Surgeon: Tonny Branch, MD;  Location: [facility];  Service: Ophthalmology;  Laterality: Left;  left  . CHOLECYSTECTOMY    . COLONOSCOPY W/ BIOPSIES AND POLYPECTOMY    . FISTULA SUPERFICIALIZATION Left 05/19/2014   Procedure: FISTULA SUPERFICIALIZATION-LEFT ARM;  Surgeon: [name], MD;  Location: [place];  Service: Vascular;  Laterality: Left;  . INSERTION OF DIALYSIS CATHETER N/A 01/24/2014   Procedure: INSERTION OF DIALYSIS CATHETER;  Surgeon: Rosetta Posner, MD;  Location[facility];  Service: Vascular;  Laterality: N/A;  . KNEE ARTHROSCOPY    . TUBAL LIGATION       Social History   reports that she quit smoking about 20 years ago. She has never used smokeless tobacco. She reports that she does not drink alcohol and does not use drugs.   Family History   Her family history includes Cancer in her brother; Diabetes in her brother, father, mother, and sister; Heart attack in her brother and father; Heart disease in her brother and father; Hyperlipidemia in her sister; Hypertension in her brother, mother, and sister. There is no history of Colon cancer.   Allergies No Known Allergies   Home Medications  Prior to Admission medications   Medication Sig Start Date End Date Taking? Authorizing Provider  acetaminophen (TYLENOL) 500 MG tablet Take 1,000 mg by mouth every 6 (six) hours as needed for moderate pain.    Yes [provider]  albuterol (VENTOLIN HFA) 108 (90 Base) MCG/ACT inhaler Inhale 2 puffs into the lungs every 4 (four) hours as needed for wheezing or shortness of breath (cough, shortness of breath or wheezing.). 04/25/19   Yes Johnson, Clanford L, MD  Biotin (BIOTIN MAXIMUM STRENGTH) 5 MG CAPS Take 5 mg by mouth daily.   Yes [provider]  gabapentin (NEURONTIN) 100 MG capsule Take 100 mg by mouth 2 (two) times daily.  09/13/18  Yes [provider]  guaiFENesin (MUCINEX) 600 MG 12 hr tablet Take 1 tablet (600 mg total) by mouth 2 (two) times daily. 09/30/19  Yes Emokpae, Courage, MD  levothyroxine (SYNTHROID) 75 MCG tablet Take 75 mcg by mouth daily. 01/07/20  Yes [provider]  metoprolol tartrate (LOPRESSOR) 25 MG tablet Take 1 tablet (25 mg total) by mouth 2 (two) times daily. 09/30/19  Yes Emokpae, Courage, MD  midodrine (PROAMATINE) 5 MG tablet Take 1 tablet (5 mg total) by mouth 3 (three) times daily with meals. 09/30/19  Yes Emokpae, Courage, MD  Multiple Vitamins-Minerals (PRESERVISION AREDS 2 PO) Take 1 tablet by mouth 2 (two) times daily.   Yes [provider]  multivitamin (RENA-VIT) TABS tablet Take 1 tablet by mouth every evening.  07/06/18  Yes [provider]  Omega-3 Fatty Acids (OMEGA-3 FISH OIL) 300 MG CAPS Take 300 mg by mouth 2 (two) times daily.   Yes [provider]  omeprazole (PRILOSEC OTC) 20 MG tablet Take 20 mg by mouth daily.   Yes [provider]  Propylene Glycol (SYSTANE BALANCE) 0.6 % SOLN Place 1 drop into both eyes 3 (three) times daily as needed (for burning or dry eyes).    Yes [provider]  RENVELA 800 MG tablet Take 2,400 mg by mouth 3 (three) times daily with meals. Takes 1 tablet with snacks 04/17/15  Yes [provider]  warfarin (COUMADIN) 5 MG tablet TAKE 1 & 1/2 (ONE & ONE-HALF)  TABLETS  TO  2  TABLETS  BY MOUTH ONCE  DAILY  AS  DIRECTED  BY  COUMADIN  CLINIC Patient taking differently: Take 5-10 mg by mouth See admin instructions. TAKE  2  TABLETS  (10mg )  BY MOUTH ONCE  DAILY  AS  DIRECTED  BY  COUMADIN  CLINIC Except 1 tablet (5mg ) on Friday. 11/24/19  Yes Duke, Tami Lin, PA  fluticasone  (FLOVENT HFA) 44 MCG/ACT inhaler Inhale 2 puffs into the lungs 2 (two) times daily. Patient not taking: Reported on 11/25/2019 04/25/19 04/24/20  Murlean Iba, MD  pantoprazole (PROTONIX) 40 MG tablet Take 1 tablet (40 mg total) by mouth daily before breakfast. Patient not taking: Reported on 02/02/2020 09/30/19   Roxan Hockey, MD     Critical care time: 34 minutes   Electronically signed by Myrle Sheng, [place], 01/28/20, [phone]

## 2020-01-29 NOTE — Progress Notes (Signed)
NAME:  Sabrina Beck, MRN:  [id number], DOB:  05-Jul-1941, LOS: 36 ADMISSION DATE:  01/23/2020, CONSULTATION DATE: 6/14 REFERRING MD: Dr. Vanita Panda EDP, CHIEF COMPLAINT: Cardiac arrest  Brief History   79 year old female with ESRD on HD presented to [facility] emergency department 6/14 with respiratory distress and suffered cardiac arrest immediately upon arrival.  Brief arrest and responsive after.  Intubated during arrest and transferred to [facility] ED for ICU admission.  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  She has been compliant with dialysis and has reportedly not missed a treatment.  Last treatment 4/11.  In the morning hours of 6/14 when her son who she lives with attempted to wake her up for dialysis she was difficult to arouse and complained of trouble breathing.  He transported her to the emergency department and she became unresponsive as they pulled into the parking lot.  ED staff immediately realized she was pulseless and began resuscitative efforts.  Presenting rhythm was asystole.  The patient underwent what sounds like 2 rounds of CPR including epinephrine, atropine, and hyperkalemia treatment.  There was concern for STEMI based on rhythm change however, EKG was reviewed by cardiology who was not quite as concerned.  The patient was transferred to [facility] emergency department with plans for admission to ICU when bed became available.  In [facility] emergency department the patient was awake on the ventilator and able to respond to nursing.  She was given given sedation for vent comfort.  She was found to be hypotensive and was started on phenylephrine IV infusion. PCCM called for admission.   Past Medical History   has a  past medical history of Anemia, Atrial fibrillation ([place]), Chest pain (54/65/0354), Diastolic heart failure ([place]), Diverticulosis, ESRD on hemodialysis ([place]), GERD (gastroesophageal reflux disease), Headache, Mixed hyperlipidemia, Peripheral neuropathy, PONV (postoperative nausea and vomiting), Systemic hypertension, and Uterine cancer ([place]).   Significant Hospital Events   6/14 admit for cardiac arrest 6/19 failed extubation attempt  Consults:  Nephrology Vascular surgery   Procedures:  ETT 6/14 > 6/19. 6/19 >  CVL 6/14 >  Significant Diagnostic Tests:  CXR on presentation: R>L infiltrate which is evolving  CTA C/A/P 6/23: type 2 aortic dissection, small pericardial effusion, small left hemothorax  Micro Data:  Blood 6/14 > Tracheal aspirate 6/21 > rare WBC, rare budding yeast Trach aspirate 6/23> GNR Blood 6/23> Serratia marcescens  Antimicrobials:  Cefepime 6/14 > 6/22 Vancomycin 6/14 >  Interim history/subjective:  Still needing frequent suctioning for large volume of secretions  Objective   Blood pressure (!) 141/35, pulse 85, temperature 98.2 F (36.8 C), temperature source Axillary, resp. rate 18, height 5\' 4"  (1.626 m), weight 91.8 kg, SpO2 99 %.    Vent Mode: PSV;CPAP FiO2 (%):  [30 %-40 %] 40 % Set Rate:  [24 bmp] 24 bmp Vt Set:  [430 mL] 430 mL PEEP:  [5 cmH20] 5 cmH20 Pressure Support:  [8 cmH20] 8 cmH20 Plateau Pressure:  [21 cmH20] 21 cmH20   Intake/Output Summary (Last 24 hours) at 01/29/2020 1333 Last data filed at 01/29/2020 1100 Gross per 24 hour  Intake 3762.72 ml  Output 3826 ml  Net -63.28 ml   Filed Weights   01/27/20 0500 01/28/20 0500 01/29/20 0500  Weight: 93.3 kg  93.3 kg 91.8 kg    Examination: General: Elderly woman lying in bed intubated, lightly sedated HENT: [place]/AT, eyes anicteric, ETT & OGT Lungs: thick tan secretions from ET tube, rhonchi on the right.  Breathing comfortably on pressure support for CPAP 8+5 Cardiovascular:  Regular rhythm, regular rate Abdomen: Soft, nontender, nondistended Extremities: No significant peripheral edema, no clubbing Neuro: RASS equals 0, globally weak, tracking, nodding to answer yes and no questions. Derm: Ongoing bruising along her left flank and left lateral breast.   Assessment & Plan:   Acute hypoxemic respiratory failure due to RLL community-acquired pneumonia: History of multiple pneumonias recently and has a R sided infiltrate on CXR.  Attempted extubation to high flow nasal cannula today given increasing agitation and minimal ventilator requirements but promptly failed and required re-intubation.  Cords and arytenoid swollen on reintubation. -Continue low tidal volume ventilation -Daily SAT and SBT.  Able to do the work of breathing, but extremely frequent suctioning needs preclude safe extubation. -VAP prevention protocol -Continue to monitor sputum cultures and GNR coverage.  Okay to discontinue vancomycin. -Sedation as required to maintain vent tolerance  Critically ill due to shock- septic most likely.  Potentially requiring midodrine at home. -Con't to drain -Continue vasopressors as required to maintain MAP greater than 65.  Unfortunately with wide pulse pressure, it has been challenging to meet her goal MAP and stay under a systolic blood pressure of 120. 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  With her A. fib with RVR and wide pulse pressures, this has not been feasible.  Digoxin loaded overnight without significant improvement.  Cardiac arrest: PEA/Asystole. Awake and following commands on arrival to ED. Likely a primary respiratory arrest. ROSC with 2 rounds CPR.  -Continue supportive  care  ESRD on HD -Continue CRRT; appreciate nephrology's recommendations -Renally dose medications and avoid nephrotoxic meds  Atrial fibrillation with RVR: on warfarin, metoprolol at home. -Continue PTA metoprolol -Continue amiodarone infusion -Holding anticoagulation due to aortic dissection  Anemia of chronic disease Thrombocytopenia likely 2/2 acute illness -Continue to monitor -Transfuse for hemoglobin <7 or hemodynamically significant bleeding-- depending on CT results may need transfusion but echo does not support hypovolemic shock.  DM with hyperglycemia, mostly controlled -Increase novolog q4h TF coverage with hold parameters -ssi PRN -goal BG 140-180 while admitted  [place] -2 sons updated at bedside.  They understand the severity of her illness and her poor long-term prognosis.  They were planning to perform a terminal extubation with a plan to focus on her comfort in the coming days. -Liberalize visitation  Best practice:  Diet: NPO - tube feeds Pain/Anxiety/Delirium protocol (if indicated): Fentanyl infusion, started enteral quetiapine VAP protocol (if indicated): Bundle in place DVT prophylaxis: heparin gtt on hold GI prophylaxis: PPI Glucose control: SSI Mobility: BR Code Status: DNR Family Communication: Updated sons at bedside Disposition: ICU    This patient is critically ill with multiple organ system failure which requires frequent high complexity decision making, assessment, support, evaluation, and titration of therapies. This was completed through the application of advanced monitoring technologies and extensive interpretation of multiple databases. During this encounter critical care time was devoted to patient care services described in this note for 40 minutes.   Julian Hy, DO 01/29/20 2:19 PM [facility]

## 2020-01-29 NOTE — Consult Note (Signed)
[address]       [place] [address]             [phone]        Inella W Settles [place] Medical Record #[id number] Date of Birth: 01-Sep-1940  Referring: No ref. provider found Primary Care: Redmond School, MD Primary Cardiologist:Mihai Croitoru, MD  Chief Complaint:    Chief Complaint  Patient presents with  . Respiratory Distress    History of Present Illness:      79 year old lady with a history of chronic lung disease and renal failure presented approximately 10 days ago with a respiratory and cardiac arrest.  She has been in the ICU with failure to wean from the ventilator for the past several days.  Examination demonstrated ecchymosis of the left flank.  CT scan of the chest was performed due to hypotension which showed a proximal descending aortic dissection.  Consultation is obtained from thoracic surgery for consideration of repair.  Current Activity/ Functional Status: Patient will not be independent with mobility/ambulation, transfers, ADL's, IADL's.   Zubrod Score: At the time of surgery this patient's most appropriate activity status/level should be described as: []    0    Normal activity, no symptoms []    1    Restricted in physical strenuous activity but ambulatory, able to do out light work []    2    Ambulatory and capable of self care, unable to do work activities, up and about                 more than 50%  Of the time                            []    3    Only limited self care, in bed greater than 50% of waking hours []    4    Completely disabled, no self care, confined to bed or chair [x]    5    Moribund  Past Medical History:  Diagnosis Date  . Anemia   . Atrial fibrillation ([place])   . Chest pain 05/15/2006   Stress test negative for ischemia  . Diastolic heart failure ([place])   . Diverticulosis   . ESRD on hemodialysis ([place])   . GERD (gastroesophageal reflux disease)   . Headache   . Mixed hyperlipidemia   .  Peripheral neuropathy   . PONV (postoperative nausea and vomiting)   . Systemic hypertension   . Uterine cancer [facility])     Past Surgical History:  Procedure Laterality Date  . ABDOMINAL HYSTERECTOMY    . APPENDECTOMY    . AV FISTULA PLACEMENT Left 11/17/2013   Procedure: CREATION OF LEFT RADIOCEPHALIC ARTERIOVENOUS (AV) FISTULA ;  Surgeon: [name], MD;  Location: [place];  Service: Vascular;  Laterality: Left;  . BREAST SURGERY Left    tumors non canerous 2- 3 different surgeries  . CATARACT EXTRACTION W/PHACO Right 10/26/2016   Procedure: CATARACT EXTRACTION PHACO AND INTRAOCULAR LENS PLACEMENT (IOC) CDE= 11.91;  Surgeon: Tonny Branch, MD;  Location: [facility];  Service: Ophthalmology;  Laterality: Right;  right - pt knows to arrive at 6:30  . CATARACT EXTRACTION W/PHACO Left 11/30/2016   Procedure: CATARACT EXTRACTION PHACO AND INTRAOCULAR LENS PLACEMENT (IOC) CDE - 7.92 ;  Surgeon: Tonny Branch, MD;  Location: [facility];  Service: Ophthalmology;  Laterality: Left;  left  .  CHOLECYSTECTOMY    . COLONOSCOPY W/ BIOPSIES AND POLYPECTOMY    . FISTULA SUPERFICIALIZATION Left 05/19/2014   Procedure: FISTULA SUPERFICIALIZATION-LEFT ARM;  Surgeon: [name], MD;  Location: [place];  Service: Vascular;  Laterality: Left;  . INSERTION OF DIALYSIS CATHETER N/A 01/24/2014   Procedure: INSERTION OF DIALYSIS CATHETER;  Surgeon: Rosetta Posner, MD;  Location[facility];  Service: Vascular;  Laterality: N/A;  . KNEE ARTHROSCOPY    . TUBAL LIGATION      Social History   Tobacco Use  Smoking Status Former Smoker  . Quit date: 08/08/1999  . Years since quitting: 20.4  Smokeless Tobacco Never Used    Social History   Substance and Sexual Activity  Alcohol Use No  . Alcohol/week: 0.0 standard drinks     No Known Allergies  Current Facility-Administered Medications  Medication Dose Route Frequency Provider Last Rate Last Admin  .  prismasol BGK 4/2.5 infusion   CRRT Continuous Roney Jaffe, MD  200 mL/hr at 01/29/20 0003 New Bag at 01/29/20 0003  . 0.9 %  sodium chloride infusion   Intravenous PRN Kipp Brood, MD 10 mL/hr at 01/29/20 0700 Rate Verify at 01/29/20 0700  . alteplase (CATHFLO ACTIVASE) injection 2 mg  2 mg Intracatheter Once PRN Roney Jaffe, MD      . amiodarone (NEXTERONE PREMIX) 360-4.14 MG/200ML-% (1.8 mg/mL) IV infusion  30 mg/hr Intravenous Continuous Julian Hy, DO 16.67 mL/hr at 01/29/20 0731 30 mg/hr at 01/29/20 0731  . B-complex with vitamin C tablet 1 tablet  1 tablet Per Tube Daily Kipp Brood, MD   1 tablet at 01/28/20 0909  . chlorhexidine gluconate (MEDLINE KIT) (PERIDEX) 0.12 % solution 15 mL  15 mL Mouth Rinse BID Icard, Bradley L, DO   15 mL at 01/28/20 1940  . Chlorhexidine Gluconate Cloth 2 % PADS 6 each  6 each Topical Daily Kipp Brood, MD   6 each at 01/28/20 2130  . Darbepoetin Alfa (ARANESP) injection 100 mcg  100 mcg Intravenous Q Wed-HD Corliss Parish, MD   100 mcg at 01/28/20 1912  . docusate (COLACE) 50 MG/5ML liquid 100 mg  100 mg Per Tube Daily PRN Noemi Chapel P, DO      . feeding supplement (PRO-STAT SUGAR FREE 64) liquid 30 mL  30 mL Per Tube BID Noemi Chapel P, DO   30 mL at 01/28/20 2105  . feeding supplement (VITAL AF 1.2 CAL) liquid 1,000 mL  1,000 mL Per Tube Continuous Julian Hy, DO 55 mL/hr at 01/28/20 2300 Rate Verify at 01/28/20 2300  . fentaNYL (SUBLIMAZE) bolus via infusion 25 mcg  25 mcg Intravenous Q15 min PRN Agarwala, Einar Grad, MD      . fentaNYL 2530mg in NS 2560m(1075mml) infusion-PREMIX  25-200 mcg/hr Intravenous Continuous Agarwala, Ravi, MD 2.5 mL/hr at 01/29/20 0700 25 mcg/hr at 01/29/20 0700  . fludrocortisone (FLORINEF) tablet 0.1 mg  0.1 mg Per Tube Daily ClaNoemi Chapel DO   0.1 mg at 01/28/20 1159  . heparin injection 1,000-6,000 Units  1,000-6,000 Units CRRT PRN SchRoney JaffeD      . hydrocortisone sodium succinate (SOLU-CORTEF) 100 MG injection 50 mg  50 mg Intravenous Q6H ClaNoemi Chapel, DO   50 mg at 01/29/20 0521  . insulin aspart (novoLOG) injection 0-6 Units  0-6 Units Subcutaneous Q4H HofCorey HaroldP   1 Units at 01/29/20 0420  . insulin aspart (novoLOG) injection 1 Units  1 Units Subcutaneous Q4H Clark,  Venita Sheffield, DO   1 Units at 01/29/20 0420  . ipratropium-albuterol (DUONEB) 0.5-2.5 (3) MG/3ML nebulizer solution 3 mL  3 mL Nebulization BID Noemi Chapel P, DO   3 mL at 01/29/20 0717  . levothyroxine (SYNTHROID) tablet 75 mcg  75 mcg Per Tube [id number] Kipp Brood, MD   75 mcg at 01/29/20 0521  . MEDLINE mouth rinse  15 mL Mouth Rinse 10 times per day Icard, Bradley L, DO   15 mL at 01/29/20 0602  . meropenem (MERREM) 1 g in sodium chloride 0.9 % 100 mL IVPB  1 g Intravenous Q8H Priscella Mann, RPH   Stopped at 01/29/20 0603  . metoprolol tartrate (LOPRESSOR) 25 mg/10 mL oral suspension 25 mg  25 mg Per Tube BID Kipp Brood, MD   25 mg at 01/28/20 2105  . midazolam (VERSED) injection 1 mg  1 mg Intravenous Q2H PRN Carmin Muskrat, MD   1 mg at 01/27/20 1542  . midodrine (PROAMATINE) tablet 10 mg  10 mg Per Tube TID Kipp Brood, MD   10 mg at 01/28/20 1737  . pantoprazole sodium (PROTONIX) 40 mg/20 mL oral suspension 40 mg  40 mg Per Tube Q1200 Anders Simmonds, MD   40 mg at 01/28/20 1155  . phenylephrine CONCENTRATED 158m in sodium chloride 0.9% 2546m(0.54m40mL) infusion  0-400 mcg/min Intravenous Titrated Agarwala, Ravi, MD 22.5 mL/hr at 01/29/20 0730 150 mcg/min at 01/29/20 0730  . polyethylene glycol (MIRALAX / GLYCOLAX) packet 17 g  17 g Per Tube Daily PRN AgaKipp BroodD      . prismasol BGK 2/2.5 dialysis solution   CRRT Continuous GolCorliss ParishD 1,500 mL/hr at 01/29/20 0730 New Bag at 01/29/20 0730  . prismasol BGK 2/2.5 replacement solution   CRRT Continuous SchRoney JaffeD 400 mL/hr at 01/28/20 2358 New Bag at 01/28/20 2358  . QUEtiapine (SEROQUEL) tablet 50 mg  50 mg Per Tube BID AgaKipp BroodD   50 mg at 01/28/20 2105  . sodium  chloride 0.9 % primer fluid for CRRT   CRRT PRN SchRoney JaffeD      . sodium chloride flush (NS) 0.9 % injection 10-40 mL  10-40 mL Intracatheter Q12H AgaKipp BroodD   10 mL at 01/28/20 2202  . sodium chloride flush (NS) 0.9 % injection 10-40 mL  10-40 mL Intracatheter PRN AgaKipp BroodD      . vancomycin (VANCOCIN) IVPB 1000 mg/200 mL premix  1,000 mg Intravenous Q24H MilPriscella MannPH      . vasopressin (PITRESSIN) 40 Units in sodium chloride 0.9 % 250 mL (0.16 Units/mL) infusion  0.03 Units/min Intravenous Continuous ClaJulian HyO 11.25 mL/hr at 01/29/20 0734 0.03 Units/min at 01/29/20 0734    Medications Prior to Admission  Medication Sig Dispense Refill Last Dose  . acetaminophen (TYLENOL) 500 MG tablet Take 1,000 mg by mouth every 6 (six) hours as needed for moderate pain.    unk  . albuterol (VENTOLIN HFA) 108 (90 Base) MCG/ACT inhaler Inhale 2 puffs into the lungs every 4 (four) hours as needed for wheezing or shortness of breath (cough, shortness of breath or wheezing.). 18 g 1 unk  . Biotin (BIOTIN MAXIMUM STRENGTH) 5 MG CAPS Take 5 mg by mouth daily.   Past Week at Unknown time  . gabapentin (NEURONTIN) 100 MG capsule Take 100 mg by mouth 2 (two) times daily.    01/18/2020 at Unknown time  . guaiFENesin (MUCINEX) 600 MG 12 hr  tablet Take 1 tablet (600 mg total) by mouth 2 (two) times daily. 20 tablet 0 Past Month at Unknown time  . levothyroxine (SYNTHROID) 75 MCG tablet Take 75 mcg by mouth daily.   01/18/2020 at Unknown time  . metoprolol tartrate (LOPRESSOR) 25 MG tablet Take 1 tablet (25 mg total) by mouth 2 (two) times daily. 60 tablet 2 01/18/2020 at 1800  . midodrine (PROAMATINE) 5 MG tablet Take 1 tablet (5 mg total) by mouth 3 (three) times daily with meals. 90 tablet 2 01/18/2020 at Unknown time  . Multiple Vitamins-Minerals (PRESERVISION AREDS 2 PO) Take 1 tablet by mouth 2 (two) times daily.   01/18/2020 at Unknown time  . multivitamin (RENA-VIT) TABS tablet  Take 1 tablet by mouth every evening.    01/18/2020 at Unknown time  . Omega-3 Fatty Acids (OMEGA-3 FISH OIL) 300 MG CAPS Take 300 mg by mouth 2 (two) times daily.   01/18/2020 at Unknown time  . omeprazole (PRILOSEC OTC) 20 MG tablet Take 20 mg by mouth daily.   01/18/2020 at Unknown time  . Propylene Glycol (SYSTANE BALANCE) 0.6 % SOLN Place 1 drop into both eyes 3 (three) times daily as needed (for burning or dry eyes).    Past Week at Unknown time  . RENVELA 800 MG tablet Take 2,400 mg by mouth 3 (three) times daily with meals. Takes 1 tablet with snacks   01/18/2020 at Unknown time  . warfarin (COUMADIN) 5 MG tablet TAKE 1 & 1/2 (ONE & ONE-HALF) TABLETS  TO  2  TABLETS  BY MOUTH ONCE  DAILY  AS  DIRECTED  BY  COUMADIN  CLINIC (Patient taking differently: Take 5-10 mg by mouth See admin instructions. TAKE  2  TABLETS  (41m)  BY MOUTH ONCE  DAILY  AS  DIRECTED  BY  COUMADIN  CLINIC Except 1 tablet (564m on Friday.) 150 tablet 0 01/18/2020 at 1800  . fluticasone (FLOVENT HFA) 44 MCG/ACT inhaler Inhale 2 puffs into the lungs 2 (two) times daily. (Patient not taking: Reported on 11/25/2019) 1 Inhaler 2 Not Taking at Unknown time  . pantoprazole (PROTONIX) 40 MG tablet Take 1 tablet (40 mg total) by mouth daily before breakfast. (Patient not taking: Reported on 01/18/2020) 30 tablet 2 Not Taking at Unknown time    Family History  Problem Relation Age of Onset  . Diabetes Mother   . Hypertension Mother   . Diabetes Father   . Heart disease Father        before age 81. Heart attack Father   . Diabetes Sister   . Hyperlipidemia Sister   . Hypertension Sister   . Cancer Brother   . Diabetes Brother   . Heart disease Brother   . Hypertension Brother   . Heart attack Brother   . Colon cancer Neg Hx      Review of Systems:   ROS Review of systems not obtained due to patient factors.     Cardiac Review of Systems: Y or  [    ]= no  Chest Pain [    ]  Resting SOB [   ] Exertional SOB  [  ]    Orthopnea [  ]   Pedal Edema [   ]    Palpitations [  ] Syncope  [  ]   Presyncope [   ]  General Review of Systems: [Y] = yes [  ]=no Constitional: recent weight change [  ]; anorexia [  ];  fatigue [  ]; nausea [  ]; night sweats [  ]; fever [  ]; or chills [  ]                                                               Dental: Last Dentist visit:   Eye : blurred vision [  ]; diplopia [   ]; vision changes [  ];  Amaurosis fugax[  ]; Resp: cough [  ];  wheezing[  ];  hemoptysis[  ]; shortness of breath[  ]; paroxysmal nocturnal dyspnea[  ]; dyspnea on exertion[  ]; or orthopnea[  ];  GI:  gallstones[  ], vomiting[  ];  dysphagia[  ]; melena[  ];  hematochezia [  ]; heartburn[  ];   Hx of  Colonoscopy[  ]; GU: kidney stones [  ]; hematuria[  ];   dysuria [  ];  nocturia[  ];  history of     obstruction [  ]; urinary frequency [  ]             Skin: rash, swelling[  ];, hair loss[  ];  peripheral edema[  ];  or itching[  ]; Musculosketetal: myalgias[  ];  joint swelling[  ];  joint erythema[  ];  joint pain[  ];  back pain[  ];  Heme/Lymph: bruising[  ];  bleeding[  ];  anemia[  ];  Neuro: TIA[  ];  headaches[  ];  stroke[  ];  vertigo[  ];  seizures[  ];   paresthesias[  ];  difficulty walking[  ];  Psych:depression[  ]; anxiety[  ];  Endocrine: diabetes[  ];  thyroid dysfunction[  ];             Physical Exam: BP (!) 141/35   Pulse 92   Temp 98.9 F (37.2 C) (Axillary)   Resp 16   Ht 5' 4" (1.626 m)   Wt 91.8 kg   SpO2 98%   BMI 34.74 kg/m    General appearance: Intubated and sedated but responsive to voice Neck: no adenopathy, no carotid bruit, no JVD, supple, symmetrical, trachea midline and thyroid not enlarged, symmetric, no tenderness/mass/nodules Resp: Decreased breath sounds bilaterally Back: symmetric, no curvature. ROM normal. No CVA tenderness., Ecchymosis of the left flank Cardio: regular rate and rhythm, S1, S2 normal, no murmur, click, rub or gallop GI: soft,  non-tender; bowel sounds normal; no masses,  no organomegaly Extremities: Cool Neurologic: Mental status: Alert, oriented, thought content appropriate, Sedated  Diagnostic Studies & Laboratory data:     Recent Radiology Findings:   DG CHEST PORT 1 VIEW  Result Date: 01/28/2020 CLINICAL DATA:  Respiratory distress. EXAM: PORTABLE CHEST 1 VIEW COMPARISON:  Chest x-ray dated January 25, 2020. FINDINGS: Unchanged endotracheal and enteric tubes. Unchanged left internal jugular central venous catheter. Stable cardiomegaly. Improved pulmonary edema. Unchanged retrocardiac left lower lobe opacity. Decreased small bilateral pleural effusions. No pneumothorax. No acute osseous abnormality. IMPRESSION: 1. Improved pulmonary edema and decreased small bilateral pleural effusion. 2. Unchanged retrocardiac left lower lobe atelectasis versus infiltrate. Electronically Signed   By: Titus Dubin M.D.   On: 01/28/2020 12:54   ECHOCARDIOGRAM COMPLETE  Result Date: 01/28/2020    ECHOCARDIOGRAM REPORT   Patient Name:   Sabrina Beck  Burmester Date of Exam: 01/28/2020 Medical Rec #:  [id number]      Height:       64.0 in Accession #:    [id number]     Weight:       205.7 lb Date of Birth:  February 02, 1941      BSA:          1.980 m Patient Age:    9 years       BP:           85/42 mmHg Patient Gender: F              HR:           87 bpm. Exam Location:  Inpatient Procedure: 2D Echo Indications:    Atrial Fibrillation I48.91; Shock; acute dissection of the                 thoracic aorta extending from the distal arch throughout                 descending                 thoracic aorta  History:        Patient has prior history of Echocardiogram examinations, most                 recent 09/27/2019. Arrythmias:Atrial Fibrillation; Risk                 Factors:Dyslipidemia and Hypertension.  Sonographer:    Mikki Santee RDCS (AE) Referring Phys: [id number] [place]  1. Left ventricular ejection fraction, by estimation, is 50  to 55%. The left ventricle has low normal function. The left ventricle demonstrates regional wall motion abnormalities (see scoring diagram/findings for description). There is mild concentric left ventricular hypertrophy. Left ventricular diastolic parameters are consistent with Grade II diastolic dysfunction (pseudonormalization). There is severe hypokinesis of the left ventricular, mid anteroseptal wall.  2. Right ventricular systolic function is severely reduced. The right ventricular size is normal. There is mildly elevated pulmonary artery systolic pressure.  3. Left atrial size was moderately dilated.  4. Right atrial size was mildly dilated.  5. The mitral valve is grossly normal. No evidence of mitral valve regurgitation.  6. The aortic valve is grossly normal. Aortic valve regurgitation is trivial. FINDINGS  Left Ventricle: Left ventricular ejection fraction, by estimation, is 50 to 55%. The left ventricle has low normal function. The left ventricle demonstrates regional wall motion abnormalities. Severe hypokinesis of the left ventricular, mid anteroseptal  wall. Moderate dyskinesis of the left ventricular, basal-mid inferior wall. The left ventricular internal cavity size was normal in size. There is mild concentric left ventricular hypertrophy. Left ventricular diastolic parameters are consistent with Grade II diastolic dysfunction (pseudonormalization). Right Ventricle: The right ventricular size is normal. Right vetricular wall thickness was not assessed. Right ventricular systolic function is severely reduced. There is mildly elevated pulmonary artery systolic pressure. The tricuspid regurgitant velocity is 2.55 m/s, and with an assumed right atrial pressure of 15 mmHg, the estimated right ventricular systolic pressure is 22.2 mmHg. Left Atrium: Left atrial size was moderately dilated. Right Atrium: Right atrial size was mildly dilated. Pericardium: There is no evidence of pericardial effusion. Mitral  Valve: The mitral valve is grossly normal. No evidence of mitral valve regurgitation. Tricuspid Valve: The tricuspid valve is grossly normal. Tricuspid valve regurgitation is trivial. No evidence of tricuspid stenosis. Aortic Valve: The aortic valve is grossly normal. Aortic valve  regurgitation is trivial. There is mild calcification of the aortic valve. Aortic valve mean gradient measures 12.0 mmHg. Aortic valve peak gradient measures 23.7 mmHg. Aortic valve area, by VTI measures 2.30 cm. Pulmonic Valve: The pulmonic valve was not well visualized. Pulmonic valve regurgitation is not visualized. Aorta: The aortic root and ascending aorta are structurally normal, with no evidence of dilitation. IAS/Shunts: The atrial septum is grossly normal.  LEFT VENTRICLE PLAX 2D LVIDd:         4.40 cm LVIDs:         2.80 cm LV PW:         1.40 cm LV IVS:        1.20 cm LVOT diam:     2.80 cm LV SV:         97 LV SV Index:   49 LVOT Area:     6.16 cm  RIGHT VENTRICLE TAPSE (M-mode): 0.6 cm LEFT ATRIUM             Index       RIGHT ATRIUM           Index LA diam:        3.40 cm 1.72 cm/m  RA Area:     20.30 cm LA Vol (A2C):   75.2 ml 37.98 ml/m RA Volume:   62.50 ml  31.57 ml/m LA Vol (A4C):   92.9 ml 46.92 ml/m LA Biplane Vol: 90.6 ml 45.76 ml/m  AORTIC VALVE AV Area (Vmax):    2.09 cm AV Area (Vmean):   2.19 cm AV Area (VTI):     2.30 cm AV Vmax:           243.33 cm/s AV Vmean:          162.000 cm/s AV VTI:            0.420 m AV Peak Grad:      23.7 mmHg AV Mean Grad:      12.0 mmHg LVOT Vmax:         82.50 cm/s LVOT Vmean:        57.700 cm/s LVOT VTI:          0.157 m LVOT/AV VTI ratio: 0.37  AORTA Ao Root diam: 3.30 cm MR Peak grad: 82.4 mmHg   TRICUSPID VALVE MR Vmax:      454.00 cm/s TR Peak grad:   26.0 mmHg                           TR Vmax:        255.00 cm/s                            SHUNTS                           Systemic VTI:  0.16 m                           Systemic Diam: 2.80 cm Mertie Moores MD  Electronically signed by Mertie Moores MD Signature Date/Time: 01/28/2020/4:18:59 PM    Final    CT Angio Chest/Abd/Pel for Dissection W and/or W/WO  Result Date: 01/28/2020 CLINICAL DATA:  Inpatient.  Shock.  Post code.  Left-sided bruising. EXAM: CT ANGIOGRAPHY CHEST, ABDOMEN AND PELVIS TECHNIQUE: Non-contrast CT of the chest was initially obtained. Multidetector CT  imaging through the chest, abdomen and pelvis was performed using the standard protocol during bolus administration of intravenous contrast. Multiplanar reconstructed images and MIPs were obtained and reviewed to evaluate the vascular anatomy. CONTRAST:  178m OMNIPAQUE IOHEXOL 350 MG/ML SOLN COMPARISON:  Chest radiograph from earlier today. 04/20/2019 chest CT. 04/19/2019 CT abdomen/pelvis. FINDINGS: CTA CHEST FINDINGS Cardiovascular: Mild cardiomegaly. New small hemorrhagic pericardial effusion. Three-vessel coronary atherosclerosis. There is acute intramural hemorrhage throughout the wall of ascending thoracic aorta on the precontrast sequence. 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 Aortic arch branch vessels are patent. Normal caliber pulmonary arteries. No central pulmonary emboli. Left internal jugular central venous catheter terminates in upper third of the SVC. Mediastinum/Nodes: Subcentimeter hypodense partially calcified left thyroid isthmus nodule. Not clinically significant; no follow-up imaging recommended (ref: J Am Coll Radiol. 2015 Feb;12(2): 143-50). Enteric tube terminates in the proximal stomach. No pathologically enlarged axillary, mediastinal or hilar lymph nodes. Lungs/Pleura: No pneumothorax. Small dependent bilateral pleural effusions, left greater than right. Endotracheal tube tip is 2.4 cm above  the carina. Occluded left lower lobe bronchus with low attenuation debris with complete left lower lobe atelectasis. Mild passive dependent right lower lobe atelectasis. Patchy mild ground-glass opacity throughout both lungs. No discrete lung masses or significant pulmonary nodules. Musculoskeletal: No aggressive appearing focal osseous lesions. Minimally displaced anterior second through fifth right rib fractures. Minimally displaced anterior left second through sixth rib fractures. Mild thoracic spondylosis. Comminuted minimally displaced mid sternal fracture. Review of the MIP images confirms the above findings. CTA ABDOMEN AND PELVIS FINDINGS VASCULAR Aorta: Descending thoracic aortic acute dissection extends into the suprarenal abdominal aorta. Abdominal aorta is nonaneurysmal. Celiac: Patent. SMA: Moderate proximal atherosclerotic stenosis, approximately 50%. Renals: Diminutive bilateral renal arteries with suggestion of high-grade proximal stenoses bilaterally. IMA: Patent without evidence of aneurysm, dissection, vasculitis or significant stenosis. Inflow: Patent without evidence of aneurysm, dissection, vasculitis or significant stenosis. Veins: No obvious venous abnormality within the limitations of this arterial phase study. Review of the MIP images confirms the above findings. NON-VASCULAR Hepatobiliary: Normal liver with no liver mass. Cholecystectomy. No biliary ductal dilatation. Pancreas: Normal, with no mass or duct dilation. Spleen: Normal size. No mass. Adrenals/Urinary Tract: Normal adrenals. Severe symmetric bilateral renal atrophy with no hydronephrosis. Small hypodense bilateral renal cortical lesions, largest 1.5 cm in the posterior interpolar right kidney (series 7/image 156), not appreciably changed since 04/19/2019 CT, probably benign complex cysts. Normal collapse bladder. Stomach/Bowel: Enteric tube terminates in proximal stomach. Stomach is nondistended and otherwise normal. Normal  caliber small bowel with no small bowel wall thickening. Normal appendix. Moderate sigmoid diverticulosis with no large bowel wall thickening or significant pericolonic fat stranding. Rectal drainage tube in place. Vascular/Lymphatic: No pathologically enlarged lymph nodes in the abdomen or pelvis. Reproductive: Status post hysterectomy, with no abnormal findings at the vaginal cuff. No adnexal mass. Other: No pneumoperitoneum, ascites or focal fluid collection. Musculoskeletal: No aggressive appearing focal osseous lesions. Mild lumbar spondylosis. Review of the MIP images confirms the above findings. IMPRESSION: 1. 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 Acute intramural hemorrhage throughout the ascending thoracic aorta with new small hemorrhagic pericardial effusion. 2. 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  3. Small dependent bilateral pleural effusions, left greater than right. 4. Occluded left lower lobe bronchus with low attenuation debris (probably mucous plugging) with complete left lower lobe atelectasis. 5. Cardiomegaly. Patchy mild ground-glass opacity throughout both lungs, nonspecific, favor mild pulmonary edema. 6. Anterior multilevel bilateral rib fractures with comminuted sternal fracture. No pneumothorax. 7. Well-positioned endotracheal and enteric tubes and left internal jugular central venous catheter. 8. Moderate sigmoid diverticulosis. 9. Severe symmetric bilateral renal atrophy. 10. Aortic Atherosclerosis (ICD10-I70.0). Critical Value/emergent results were called by telephone at the time of interpretation on 01/28/2020 at 1:49 pm to provider Noemi Chapel , who verbally acknowledged these results. Electronically Signed   By: Ilona Sorrel M.D.   On: 01/28/2020 13:50     I have independently reviewed the above radiologic  studies and discussed with the patient   Recent Lab Findings: Lab Results  Component Value Date   WBC 22.7 (H) 01/29/2020   HGB 9.9 (L) 01/29/2020   HCT 30.8 (L) 01/29/2020   PLT 151 01/29/2020   GLUCOSE 219 (H) 01/29/2020   CHOL 87 (L) 06/08/2015   TRIG 272 (H) 06/08/2015   HDL 25 (L) 06/08/2015   LDLCALC 8 06/08/2015   ALT 55 (H) 01/27/2020   AST 55 (H) 02/03/2020   NA 135 01/29/2020   K 4.5 01/29/2020   CL 102 01/29/2020   CREATININE 1.47 (H) 01/29/2020   BUN 33 (H) 01/29/2020   CO2 22 01/29/2020   TSH 4.301 09/22/2019   INR 1.3 (H) 01/29/2020   HGBA1C 5.3 01/29/2020      Assessment / Plan:      79 year old lady with chronic lung disease now ventilator dependent, chronic renal failure, and bouts of sepsis.  She has a new diagnosis of a Stanford type B aortic dissection.  This would typically be managed medically, focusing on impulse and blood pressure control.  I am not convinced that the pericardial effusion is in any way related to the proximal descending dissection.  In addition she has no significant aortic regurgitation, further arguing against any ascending aortic component.  Regardless, she is not a candidate for operative repair even if ascending aortic component was identified.  I have relayed this opinion to the treating team, and would generally recommend comfort care measures.    I  spent 20 minutes counseling the patient face to face.    Z. Orvan Seen, MD [phone] 01/29/2020 7:50 AM

## 2020-01-29 NOTE — Progress Notes (Signed)
Notified E-link having trouble maintaining SBP<120 without MAP dropping into the low 50s. HR still 70-80 despite digoxin. Awaiting further instruction from [place].

## 2020-01-29 NOTE — Progress Notes (Signed)
Visited with patient and provided prayer, emotional support, listening and ministry of presence. Patient is alert and said she was feeling ok today.  Chaplain will follow as needed.   Jaclynn Major, [place], [facility], Pager [phone]

## 2020-01-30 LAB — RENAL FUNCTION PANEL
Albumin: 2.5 g/dL — ABNORMAL LOW (ref 3.5–5.0)
Albumin: 2.5 g/dL — ABNORMAL LOW (ref 3.5–5.0)
Anion gap: 12 (ref 5–15)
Anion gap: 15 (ref 5–15)
BUN: 31 mg/dL — ABNORMAL HIGH (ref 8–23)
BUN: 33 mg/dL — ABNORMAL HIGH (ref 8–23)
CO2: 24 mmol/L (ref 22–32)
CO2: 24 mmol/L (ref 22–32)
Calcium: 10.6 mg/dL — ABNORMAL HIGH (ref 8.9–10.3)
Calcium: 10.6 mg/dL — ABNORMAL HIGH (ref 8.9–10.3)
Chloride: 100 mmol/L (ref 98–111)
Chloride: 97 mmol/L — ABNORMAL LOW (ref 98–111)
Creatinine, Ser: 1.57 mg/dL — ABNORMAL HIGH (ref 0.44–1.00)
Creatinine, Ser: 1.56 mg/dL — ABNORMAL HIGH (ref 0.44–1.00)
GFR calc Af Amer: 36 mL/min — ABNORMAL LOW (ref >60)
GFR calc Af Amer: 36 mL/min — ABNORMAL LOW (ref >60)
GFR calc non Af Amer: 31 mL/min — ABNORMAL LOW (ref >60)
GFR calc non Af Amer: 31 mL/min — ABNORMAL LOW (ref >60)
Glucose, Bld: 204 mg/dL — ABNORMAL HIGH (ref 70–99)
Glucose, Bld: 204 mg/dL — ABNORMAL HIGH (ref 70–99)
Phosphorus: 3.3 mg/dL (ref 2.5–4.6)
Phosphorus: 3.6 mg/dL (ref 2.5–4.6)
Potassium: 3.8 mmol/L (ref 3.5–5.1)
Potassium: 3.5 mmol/L (ref 3.5–5.1)
Sodium: 136 mmol/L (ref 135–145)
Sodium: 136 mmol/L (ref 135–145)

## 2020-01-30 LAB — CULTURE, RESPIRATORY W GRAM STAIN

## 2020-01-30 LAB — CBC WITH DIFFERENTIAL/PLATELET
Abs Immature Granulocytes: 0 10*3/uL (ref 0.00–0.07)
Basophils Absolute: 0 10*3/uL (ref 0.0–0.1)
Basophils Relative: 0 %
Eosinophils Absolute: 0 10*3/uL (ref 0.0–0.5)
Eosinophils Relative: 0 %
HCT: 28.7 % — ABNORMAL LOW (ref 36.0–46.0)
Hemoglobin: 9.2 g/dL — ABNORMAL LOW (ref 12.0–15.0)
Lymphocytes Relative: 4 %
Lymphs Abs: 1.1 10*3/uL (ref 0.7–4.0)
MCH: 35.5 pg — ABNORMAL HIGH (ref 26.0–34.0)
MCHC: 32.1 g/dL (ref 30.0–36.0)
MCV: 110.8 fL — ABNORMAL HIGH (ref 80.0–100.0)
Monocytes Absolute: 2.2 10*3/uL — ABNORMAL HIGH (ref 0.1–1.0)
Monocytes Relative: 8 %
Neutro Abs: 23.8 10*3/uL — ABNORMAL HIGH (ref 1.7–7.7)
Neutrophils Relative %: 88 %
Platelets: 159 10*3/uL (ref 150–400)
RBC: 2.59 MIL/uL — ABNORMAL LOW (ref 3.87–5.11)
RDW: 25.2 % — ABNORMAL HIGH (ref 11.5–15.5)
WBC: 27.1 10*3/uL — ABNORMAL HIGH (ref 4.0–10.5)
nRBC: 1 % — ABNORMAL HIGH (ref 0.0–0.2)
nRBC: 2 /100 WBC — ABNORMAL HIGH

## 2020-01-30 LAB — GLUCOSE, CAPILLARY
Glucose-Capillary: 186 mg/dL — ABNORMAL HIGH (ref 70–99)
Glucose-Capillary: 207 mg/dL — ABNORMAL HIGH (ref 70–99)
Glucose-Capillary: 185 mg/dL — ABNORMAL HIGH (ref 70–99)
Glucose-Capillary: 186 mg/dL — ABNORMAL HIGH (ref 70–99)
Glucose-Capillary: 177 mg/dL — ABNORMAL HIGH (ref 70–99)

## 2020-01-30 LAB — PROTIME-INR
INR: 1.2 (ref 0.8–1.2)
Prothrombin Time: 14.4 seconds (ref 11.4–15.2)

## 2020-01-30 LAB — APTT: aPTT: 31 seconds (ref 24–36)

## 2020-01-30 LAB — MAGNESIUM: Magnesium: 2.6 mg/dL — ABNORMAL HIGH (ref 1.7–2.4)

## 2020-01-30 MED ORDER — INSULIN ASPART 100 UNIT/ML ~~LOC~~ SOLN
4.0000 [IU] | SUBCUTANEOUS | Status: DC
  Administered 2020-01-30 – 2020-01-31 (×6): 4 [IU] via SUBCUTANEOUS

## 2020-01-30 MED ORDER — SODIUM CHLORIDE 0.9 % IV SOLN
2.0000 g | INTRAVENOUS | Status: AC
  Administered 2020-01-30 – 2020-02-03 (×5): 2 g via INTRAVENOUS
  Filled 2020-01-30 (×5): qty 20

## 2020-01-30 MED FILL — Phenylephrine HCl IV Soln 10 MG/ML: INTRAVENOUS | Qty: 100 | Status: AC

## 2020-01-30 MED FILL — Sodium Chloride IV Soln 0.9%: INTRAVENOUS | Qty: 250 | Status: AC

## 2020-01-30 NOTE — Progress Notes (Signed)
[facility] Progress Note  Subjective: I/O -   Running even with CRRT-  No major change-  high dose pressors.  Chest CT did reveal aortic dissection and pericardial effusion-  Made DNR and per the notes likely for terminal extubation in the coming days   Vitals:   01/30/20 0645 01/30/20 0700 01/30/20 0730 01/30/20 0745  BP:    (!) 148/40  Pulse: 81 78  75  Resp: 19 20  (!) 34  Temp:   98.9 F (37.2 C)   TempSrc:   Axillary   SpO2: 100% 100%    Weight:      Height:        Exam: Gen on vent, arouses and responds appropriately- -  left IJ TDC in for 10 days No jvd or bruits Chest occ rhonchi, improved, no wheezing RRR no MRG Abd soft ntnd no mass or ascites +bs Ext pitting edema to dep areas Neuro responsive LFA AVF+bruit      OP HD: [facility]   3.5h  2/2.5 bath  97.5kg  No heparin  LFA AVF  - EPO 6600 q Rx   Assessment/ Plan: 1. Cardiac arrest - resuscitated in ED-  Is awake 2. Sepsis/ shock/ PNA - getting IV abx, still requiring pressor support. Getting midodrine at 10 tid. Hx prior admits for PNA. Not making much progress  3. Acute resp failure - on vent,  CXR w persistent edema the other day, UF as able-  Had prompt failure of extubation over the weekend-  4. ESRD - on HD MWF.  CRRT started on 6/15 due to shock. Continue. Using 2K pre filter and dialysate, post filter 4K.   Need to continue CRRT as req pressors  - IJ vascath 30 days old-  AVF is patent 5. BP/ volume -  successful UF but needed to go up on pressors-  Now running even 6. MBD ckd -  Ca up slightly. P repleted 6/22 7. Anemia ckd - Hb 9.2 today, transfuse prn.  darbe 100ug  weekly while here. Tsat 89%, no need for IV Fe. Have increased ESA this week 8. Hyperkalemia - improved with change in dialysate 9. Atrial fibrillation - getting metoprolol 25 bid for rate control 10. Hypercalcemia-  Developed in house-  Maybe due to being sedentary-  No action yet 11. Dispo-  Heading toward  comfort care when family is ready-  Feel free to stop CRRT any time in that process-  No changes for now      Louis Meckel  01/30/2020, 7:46 AM   Recent Labs  Lab 01/29/20 0257 01/29/20 0257 01/29/20 1646 01/30/20 0314  K 4.5   < > 3.8 3.8  BUN 33*   < > 31* 31*  CREATININE 1.47*   < > 1.52* 1.57*  CALCIUM 10.6*   < > 10.8* 10.6*  PHOS 4.1   < > 3.8 3.3  HGB 9.9*  --   --  9.2*   < > = values in this interval not displayed.   Inpatient medications: . B-complex with vitamin C  1 tablet Per Tube Daily  . chlorhexidine gluconate (MEDLINE KIT)  15 mL Mouth Rinse BID  . Chlorhexidine Gluconate Cloth  6 each Topical Daily  . darbepoetin (ARANESP) injection - DIALYSIS  100 mcg Intravenous Q Wed-HD  . feeding supplement (PRO-STAT SUGAR FREE 64)  30 mL Per Tube BID  . fludrocortisone  0.1 mg Per Tube Daily  . hydrocortisone sod succinate (SOLU-CORTEF) inj  50 mg Intravenous Q6H  . insulin aspart  0-6 Units Subcutaneous Q4H  . insulin aspart  3 Units Subcutaneous Q4H  . ipratropium-albuterol  3 mL Nebulization BID  . levothyroxine  75 mcg Per Tube Q0600  . mouth rinse  15 mL Mouth Rinse 10 times per day  . metoprolol tartrate  25 mg Per Tube BID  . midodrine  10 mg Per Tube TID  . pantoprazole sodium  40 mg Per Tube Q1200  . QUEtiapine  50 mg Per Tube BID  . sodium chloride flush  10-40 mL Intracatheter Q12H   .  prismasol BGK 4/2.5 200 mL/hr at 01/30/20 0020  . sodium chloride 10 mL/hr at 01/30/20 0700  . amiodarone 30 mg/hr (01/30/20 0700)  . feeding supplement (VITAL AF 1.2 CAL) 1,000 mL (01/29/20 1400)  . fentaNYL infusion INTRAVENOUS 25 mcg/hr (01/30/20 0700)  . meropenem (MERREM) IV Stopped (01/30/20 0540)  . phenylephrine (NEO-SYNEPHRINE) Adult infusion 280 mcg/min (01/30/20 0700)  . prismasol BGK 2/2.5 dialysis solution 1,500 mL/hr at 01/30/20 0531  . prismasol BGK 2/2.5 replacement solution 400 mL/hr at 01/30/20 0100  . vasopressin (PITRESSIN) infusion - *FOR  SHOCK* 0.03 Units/min (01/30/20 0700)   sodium chloride, alteplase, docusate, fentaNYL, heparin, lip balm, midazolam, polyethylene glycol, sodium chloride, sodium chloride flush

## 2020-01-30 NOTE — Progress Notes (Signed)
NAME:  Sabrina Beck, MRN:  [id number], DOB:  03-Dec-1940, LOS: 58 ADMISSION DATE:  01/20/2020, CONSULTATION DATE: 6/14 REFERRING MD: Dr. Vanita Panda EDP, CHIEF COMPLAINT: Cardiac arrest  Brief History   79 year old female with ESRD on HD presented to [facility] emergency department 6/14 with respiratory distress and suffered cardiac arrest immediately upon arrival.  Brief arrest and responsive after.  Intubated during arrest and transferred to [facility] ED for ICU admission.  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  She has been compliant with dialysis and has reportedly not missed a treatment.  Last treatment 4/11.  In the morning hours of 6/14 when her son who she lives with attempted to wake her up for dialysis she was difficult to arouse and complained of trouble breathing.  He transported her to the emergency department and she became unresponsive as they pulled into the parking lot.  ED staff immediately realized she was pulseless and began resuscitative efforts.  Presenting rhythm was asystole.  The patient underwent what sounds like 2 rounds of CPR including epinephrine, atropine, and hyperkalemia treatment.  There was concern for STEMI based on rhythm change however, EKG was reviewed by cardiology who was not quite as concerned.  The patient was transferred to [facility] emergency department with plans for admission to ICU when bed became available.  In [facility] emergency department the patient was awake on the ventilator and able to respond to nursing.  She was given given sedation for vent comfort.  She was found to be hypotensive and was started on phenylephrine IV infusion. PCCM called for admission.   Past Medical History   has a  past medical history of Anemia, Atrial fibrillation ([place]), Chest pain (95/28/4132), Diastolic heart failure ([place]), Diverticulosis, ESRD on hemodialysis ([place]), GERD (gastroesophageal reflux disease), Headache, Mixed hyperlipidemia, Peripheral neuropathy, PONV (postoperative nausea and vomiting), Systemic hypertension, and Uterine cancer ([place]).   Significant Hospital Events   6/14 admit for cardiac arrest 6/19 failed extubation attempt  Consults:  Nephrology Vascular surgery   Procedures:  ETT 6/14 > 6/19. 6/19 >  CVL 6/14 >  Significant Diagnostic Tests:  CXR on presentation: R>L infiltrate which is evolving  CTA C/A/P 6/23: type 2 aortic dissection, small pericardial effusion, small left hemothorax  Micro Data:  Blood 6/14 > Tracheal aspirate 6/21 > rare WBC, rare budding yeast Trach aspirate 6/23> GNR Blood 6/23> Serratia marcescens  Antimicrobials:  Cefepime 6/14 > 6/22 Vancomycin 6/14 >  Interim history/subjective:  She denies complaints this morning  Objective   Blood pressure (!) 148/40, pulse 84, temperature 98.9 F (37.2 C), temperature source Axillary, resp. rate (!) 23, height 5\' 4"  (1.626 m), weight 91.8 kg, SpO2 100 %.    Vent Mode: PRVC FiO2 (%):  [40 %] 40 % Set Rate:  [24 bmp] 24 bmp Vt Set:  [430 mL] 430 mL PEEP:  [5 cmH20] 5 cmH20 Pressure Support:  [8 cmH20] 8 cmH20 Plateau Pressure:  [17 cmH20-18 cmH20] 18 cmH20   Intake/Output Summary (Last 24 hours) at 01/30/2020 0850 Last data filed at 01/30/2020 0801 Gross per 24 hour  Intake 3553.57 ml  Output 3791 ml  Net -237.43 ml   Filed Weights   01/28/20 0500 01/29/20 0500 01/30/20 0500  Weight: 93.3 kg 91.8 kg 91.8  kg    Examination: General: Elderly woman lying in bed intubated, awake HENT: [place]/AT, eyes anicteric, ET tube and OGT in place Lungs: Persistent copious thick tan secretions.  Rhonchi on the right.  Breathing comfortably on the vent  Cardiovascular: Regular rate, irregular  rhythm Abdomen: Soft, nontender, nondistended Extremities: Minimal peripheral edema, no clubbing.  Chronic venous stasis changes bilateral feet Neuro: RASS of 0, globally weak.  Attempting to communicate by talking Derm: Persistent bruising along left flank and left lateral and anterior breast-not significantly worse.  No other significant bruising.  Pallor.   Assessment & Plan:   Acute hypoxemic respiratory failure due to RLL community-acquired pneumonia.  Now with Serratia pneumonia. History of multiple pneumonias recently.  Attempted extubation to high flow nasal cannula on 6/19 given increasing agitation and minimal ventilator requirements but promptly failed and required re-intubation.  Cords and arytenoid swollen on reintubation. -Continue lung protective ventilation goal P plat less than 30 driving pressure less than 15. -Daily SAT and SBT.  Copious secretions remain the most significant barrier to extubation as she requires very frequent suctioning -VAP prevention protocol -Continue antibiotics and follow sensitivities -Sedation as required to maintain vent tolerance -My concerns been discussed with her children, who understand the severity of her illness and that she is unlikely to be able to be weaned from the ventilator at this point due to ongoing secretion management issues.  Critically ill due to shock- septic most likely.  Chronically requiring midodrine at home. -Appreciate nephrology's help with CRRT and volume management -Continue vasopressors as required to maintain MAP greater than 65.  Unfortunately with wide pulse pressure, it has been challenging to meet her goal MAP and stay under a systolic blood pressure of 120. -Continue stress dose steroids -Continue arterial line for invasive blood pressure monitoring  Type 2 aortic dissection-subacute, most likely traumatic from chest compressions -Appreciate cardiothoracic surgery's input -Medical management; goal heart rate  less than 60 and systolic blood pressure less than 120.  With her A. fib with RVR and wide pulse pressures, this has not been feasible.  Previous digoxin load without significant benefit -My concerns have been discussed with her children, who understand the gravity of this diagnosis  Cardiac arrest: PEA/Asystole. Awake and following commands on arrival to ED. Likely a primary respiratory arrest. ROSC with 2 rounds CPR.  -Continue supportive care  ESRD on HD Hypercalcemia, acute- likely 2/2 bone demineralization from immobility -Continue CRRT; appreciate nephrology's recommendations -Renally dose medications and avoid nephrotoxic meds  Atrial fibrillation with RVR: on warfarin, metoprolol at home. -Continue PTA metoprolol -Continue amiodarone infusion -Holding anticoagulation due to aortic dissection  Anemia of chronic disease Thrombocytopenia likely 2/2 acute illness -Continue to monitor -Transfuse for hemoglobin <7 or hemodynamically significant bleeding   DM with hyperglycemia, mostly controlled -Increase novolog q4h TF coverage with hold parameters-4 units -ssi PRN -goal BG 140-180 while admitted  [place] -Liberalize visitation. -We will discuss with son, come to bedside today.  [place] has been leaning towards withdrawal of care, but are struggling with this decision.  Best practice:  Diet: NPO - tube feeds Pain/Anxiety/Delirium protocol (if indicated): Fentanyl infusion, started enteral quetiapine VAP protocol (if indicated): Bundle in place DVT prophylaxis: heparin gtt on hold GI prophylaxis: PPI Glucose control: SSI Mobility: BR Code Status: DNR Family Communication: We will update sons at bedside today Disposition: ICU    This patient is critically ill with multiple organ system failure which requires frequent high complexity decision making, assessment, support, evaluation, and titration  of therapies. This was completed through the application of advanced monitoring  technologies and extensive interpretation of multiple databases. During this encounter critical care time was devoted to patient care services described in this note for 36 minutes.   Julian Hy, DO 01/30/20 10:23 AM [facility]

## 2020-01-30 NOTE — Progress Notes (Addendum)
Inpatient Diabetes Program Recommendations  AACE/ADA: New Consensus Statement on Inpatient Glycemic Control (2015)  Target Ranges:  Prepandial:   less than 140 mg/dL      Peak postprandial:   less than 180 mg/dL (1-2 hours)      Critically ill patients:  140 - 180 mg/dL   Lab Results  Component Value Date   GLUCAP 207 (H) 01/30/2020   HGBA1C 5.3 01/27/2020    Review of Glycemic Control Results for Sabrina Beck, Sabrina Beck (MRN [id number]) as of 01/30/2020 09:21  Ref. Range 01/29/2020 07:37 01/29/2020 11:14 01/29/2020 15:34 01/29/2020 19:15 01/29/2020 23:20 01/30/2020 03:15 01/30/2020 07:41  Glucose-Capillary Latest Ref Range: 70 - 99 mg/dL 196 (H) 214 (H) 215 (H) 175 (H) 201 (H) 186 (H) 207 (H)   Diabetes history: None   Current orders for Inpatient glycemic control:  Novolog 0-6 units Q4 hours Novolog 3 units Q4 hours  Vital AF 55 ml/hour Solucortef 50 mg Q6 hours  Inpatient Diabetes Program Recommendations:    Increase tube feed coverage to 4-5 units Q4 hours.  Thanks,  Tama Headings RN, MSN, BC-ADM Inpatient Diabetes Coordinator Team Pager [phone] (8a-5p)

## 2020-01-30 NOTE — Care Plan (Signed)
I updated Sabrina Beck's sons at bedside today. They understand her poor prognosis but remain hopeful she will recover. They want to discuss her care again tomorrow.   Julian Hy, DO 01/30/20 12:48 PM [facility]

## 2020-01-30 NOTE — Progress Notes (Signed)
Renal Navigator provided update to OP HD clinic/[facility] per request.  Alphonzo Cruise, [place] Renal Navigator [phone]

## 2020-01-31 LAB — RENAL FUNCTION PANEL
Albumin: 2.6 g/dL — ABNORMAL LOW (ref 3.5–5.0)
Albumin: 2.6 g/dL — ABNORMAL LOW (ref 3.5–5.0)
Anion gap: 14 (ref 5–15)
Anion gap: 14 (ref 5–15)
BUN: 32 mg/dL — ABNORMAL HIGH (ref 8–23)
BUN: 33 mg/dL — ABNORMAL HIGH (ref 8–23)
CO2: 22 mmol/L (ref 22–32)
CO2: 22 mmol/L (ref 22–32)
Calcium: 10.5 mg/dL — ABNORMAL HIGH (ref 8.9–10.3)
Calcium: 10.1 mg/dL (ref 8.9–10.3)
Chloride: 99 mmol/L (ref 98–111)
Chloride: 100 mmol/L (ref 98–111)
Creatinine, Ser: 1.52 mg/dL — ABNORMAL HIGH (ref 0.44–1.00)
Creatinine, Ser: 1.62 mg/dL — ABNORMAL HIGH (ref 0.44–1.00)
GFR calc Af Amer: 37 mL/min — ABNORMAL LOW (ref >60)
GFR calc Af Amer: 34 mL/min — ABNORMAL LOW (ref >60)
GFR calc non Af Amer: 32 mL/min — ABNORMAL LOW (ref >60)
GFR calc non Af Amer: 30 mL/min — ABNORMAL LOW (ref >60)
Glucose, Bld: 199 mg/dL — ABNORMAL HIGH (ref 70–99)
Glucose, Bld: 159 mg/dL — ABNORMAL HIGH (ref 70–99)
Phosphorus: 3.3 mg/dL (ref 2.5–4.6)
Phosphorus: 4.4 mg/dL (ref 2.5–4.6)
Potassium: 3.3 mmol/L — ABNORMAL LOW (ref 3.5–5.1)
Potassium: 3.8 mmol/L (ref 3.5–5.1)
Sodium: 135 mmol/L (ref 135–145)
Sodium: 136 mmol/L (ref 135–145)

## 2020-01-31 LAB — MAGNESIUM: Magnesium: 2.7 mg/dL — ABNORMAL HIGH (ref 1.7–2.4)

## 2020-01-31 LAB — CBC WITH DIFFERENTIAL/PLATELET
Abs Immature Granulocytes: 0.46 10*3/uL — ABNORMAL HIGH (ref 0.00–0.07)
Basophils Absolute: 0.1 10*3/uL (ref 0.0–0.1)
Basophils Relative: 0 %
Eosinophils Absolute: 0 10*3/uL (ref 0.0–0.5)
Eosinophils Relative: 0 %
HCT: 28.7 % — ABNORMAL LOW (ref 36.0–46.0)
Hemoglobin: 9.1 g/dL — ABNORMAL LOW (ref 12.0–15.0)
Immature Granulocytes: 2 %
Lymphocytes Relative: 15 %
Lymphs Abs: 3.5 10*3/uL (ref 0.7–4.0)
MCH: 35.1 pg — ABNORMAL HIGH (ref 26.0–34.0)
MCHC: 31.7 g/dL (ref 30.0–36.0)
MCV: 110.8 fL — ABNORMAL HIGH (ref 80.0–100.0)
Monocytes Absolute: 1.8 10*3/uL — ABNORMAL HIGH (ref 0.1–1.0)
Monocytes Relative: 8 %
Neutro Abs: 17.8 10*3/uL — ABNORMAL HIGH (ref 1.7–7.7)
Neutrophils Relative %: 75 %
Platelets: 147 10*3/uL — ABNORMAL LOW (ref 150–400)
RBC: 2.59 MIL/uL — ABNORMAL LOW (ref 3.87–5.11)
RDW: 24.1 % — ABNORMAL HIGH (ref 11.5–15.5)
WBC: 23.7 10*3/uL — ABNORMAL HIGH (ref 4.0–10.5)
nRBC: 0.6 % — ABNORMAL HIGH (ref 0.0–0.2)

## 2020-01-31 LAB — GLUCOSE, CAPILLARY
Glucose-Capillary: 142 mg/dL — ABNORMAL HIGH (ref 70–99)
Glucose-Capillary: 160 mg/dL — ABNORMAL HIGH (ref 70–99)
Glucose-Capillary: 161 mg/dL — ABNORMAL HIGH (ref 70–99)
Glucose-Capillary: 169 mg/dL — ABNORMAL HIGH (ref 70–99)
Glucose-Capillary: 174 mg/dL — ABNORMAL HIGH (ref 70–99)
Glucose-Capillary: 175 mg/dL — ABNORMAL HIGH (ref 70–99)

## 2020-01-31 LAB — APTT: aPTT: 30 seconds (ref 24–36)

## 2020-01-31 LAB — PROTIME-INR
INR: 1.1 (ref 0.8–1.2)
Prothrombin Time: 14.2 seconds (ref 11.4–15.2)

## 2020-01-31 MED ORDER — HEPARIN SODIUM (PORCINE) 1000 UNIT/ML DIALYSIS
1000.0000 [IU] | INTRAMUSCULAR | Status: DC | PRN
  Administered 2020-01-31: 2800 [IU] via INTRAVENOUS_CENTRAL

## 2020-01-31 MED ORDER — PRISMASOL BGK 4/2.5 32-4-2.5 MEQ/L REPLACEMENT SOLN
Status: DC
  Filled 2020-01-31 (×6): qty 5000

## 2020-01-31 MED ORDER — INSULIN ASPART 100 UNIT/ML ~~LOC~~ SOLN
5.0000 [IU] | SUBCUTANEOUS | Status: DC
  Administered 2020-01-31 – 2020-02-02 (×12): 5 [IU] via SUBCUTANEOUS

## 2020-01-31 MED ORDER — PRISMASOL BGK 4/2.5 32-4-2.5 MEQ/L IV SOLN
INTRAVENOUS | Status: DC
  Filled 2020-01-31 (×27): qty 5000

## 2020-01-31 NOTE — Progress Notes (Signed)
RT note: patient placed on CPAP/PSV of 10/5 at 0745.  Currently tolerating well at this time.  Will continue to monitor.

## 2020-01-31 NOTE — Progress Notes (Signed)
NAME:  Sabrina Beck, MRN:  [id number], DOB:  November 15, 1940, LOS: 12 ADMISSION DATE:  01/11/2020, CONSULTATION DATE: 6/14 REFERRING MD: Dr. Vanita Panda EDP, CHIEF COMPLAINT: Cardiac arrest  Brief History   79 year old female with ESRD on HD presented to [facility] emergency department 6/14 with respiratory distress and suffered cardiac arrest immediately upon arrival.  Brief arrest and responsive after.  Intubated during arrest and transferred to [facility] ED for ICU admission.  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  She has been compliant with dialysis and has reportedly not missed a treatment.  Last treatment 4/11.  In the morning hours of 6/14 when her son who she lives with attempted to wake her up for dialysis she was difficult to arouse and complained of trouble breathing.  He transported her to the emergency department and she became unresponsive as they pulled into the parking lot.  ED staff immediately realized she was pulseless and began resuscitative efforts.  Presenting rhythm was asystole.  The patient underwent what sounds like 2 rounds of CPR including epinephrine, atropine, and hyperkalemia treatment.  There was concern for STEMI based on rhythm change however, EKG was reviewed by cardiology who was not quite as concerned.  The patient was transferred to [facility] emergency department with plans for admission to ICU when bed became available.  In [facility] emergency department the patient was awake on the ventilator and able to respond to nursing.  She was given given sedation for vent comfort.  She was found to be hypotensive and was started on phenylephrine IV infusion. PCCM called for admission.   Past Medical History   has a  past medical history of Anemia, Atrial fibrillation ([place]), Chest pain (03/55/9741), Diastolic heart failure ([place]), Diverticulosis, ESRD on hemodialysis ([place]), GERD (gastroesophageal reflux disease), Headache, Mixed hyperlipidemia, Peripheral neuropathy, PONV (postoperative nausea and vomiting), Systemic hypertension, and Uterine cancer ([place]).   Significant Hospital Events   6/14 admit for cardiac arrest 6/19 failed extubation attempt  Consults:  Nephrology Vascular surgery   Procedures:  ETT 6/14 > 6/19. 6/19 >  CVL 6/14 >  Significant Diagnostic Tests:  CXR on presentation: R>L infiltrate which is evolving  CTA C/A/P 6/23: type 2 aortic dissection, small pericardial effusion, small left hemothorax  Micro Data:  Blood 6/14 > NG Tracheal aspirate 6/21 > rare WBC, rare budding yeast Trach aspirate 6/23> GNR Trach aspirate 6/23> Serratia marcescens (R: cefazolin) Blood 6/23>   Antimicrobials:  Cefepime 6/14 > 6/22 Vancomycin 6/14 >  Interim history/subjective:  Required lavage and suctioning overnight for mucous plugging.  Endorses pain with coughing from rib fractures.  Otherwise denies complaints.  Objective   Blood pressure (!) 146/42, pulse 74, temperature 97.6 F (36.4 C), temperature source Axillary, resp. rate 12, height 5\' 4"  (1.626 m), weight 89.8 kg, SpO2 100 %.    Vent Mode: PSV;CPAP FiO2 (%):  [30 %-40 %] 30 % Set Rate:  [24 bmp] 24 bmp Vt Set:  [430 mL] 430 mL PEEP:  [5 cmH20] 5 cmH20 Pressure Support:  [10 cmH20] 10 cmH20 Plateau Pressure:  [18 cmH20-22 cmH20] 18 cmH20   Intake/Output Summary (Last 24 hours) at 01/31/2020 0918 Last data filed at 01/31/2020 0900 Gross per 24 hour  Intake 3445.54 ml  Output 3579 ml  Net -133.46 ml   Filed Weights   01/29/20 0500 01/30/20 0500 01/31/20 0500  Weight: 91.8 kg 91.8 kg 89.8 kg    Examination: General: Elderly woman lying in bed in no acute distress, awake and alert, intubated HENT: [place]/AT, eyes anicteric,  ETT and OGT in place Lungs: Rhonchi on the right cleared with suctioning.  No wheezing.  Decreased basilar breath sounds.  Thicker tan secretions from ET tube, minimally decreased in volume compared to previous days. Cardiovascular: Regular rate, irregular rhythm Abdomen: Soft, nontender, nondistended  Extremities: No peripheral edema, no clubbing or cyanosis Neuro: RASS 0, moving all extremities spontaneously, answering questions by nodding. Derm: Slightly improved bruising on left flank and breast.  Petechiae on hands and arms.  Bruise on right lower neck.   Assessment & Plan:   Acute hypoxemic respiratory failure due to RLL community-acquired pneumonia.  Now with Serratia pneumonia. History of multiple pneumonias recently.  Attempted extubation to high flow nasal cannula on 6/19 given increasing agitation and minimal ventilator requirements but promptly failed and required re-intubation-probably due to upper airway edema.  Originally intubated 6/14 -Continue lung protective ventilation; P plat and driving pressure at goal -Daily SAT and SBT.  Doing well currently on 5/5.  Copious secretions main the main barrier to extubation. -VAP prevention protocol -Continue ceftriaxone to complete 7-day course -Continue fentanyl as needed to maintain vent tolerance and pain control   Critically ill due to shock- septic most likely.  Chronically requiring midodrine at home. -Appreciate neurology's assistance.  Continue CRRT. --Continue vasopressors as needed to maintain MAP greater than 65.  Management of aortic dissection medically has been complicated by difficult to control heart rates and resulting hypotension.  Seems to be tolerating heart rate and blood pressure not at goal without significant changes in hemoglobin or hemodynamic stability ongoing since discontinuation of heparin infusion. -Continue stress dose steroids -Continue arterial line for invasive blood pressure monitoring  Type 2 aortic  dissection, type 2-subacute, most likely traumatic from chest compressions -Appreciate cardiothoracic surgery's input  -Continue medical management; although not at goal due to wide pulse pressure and difficult to control A. fib, she does not seem to be acutely worsening.  Hemoglobin stable.  Continue to work towards goal SBP less than 120 and heart rate less than 60.   -My concerns have been discussed with her children, who understand the gravity of this diagnosis, especially as her heart rate and blood pressure would be more difficult to control post extubation with discontinuation of pain medications and significantly increased respiratory effort or coughing.  Cardiac arrest: PEA/Asystole. Awake and following commands on arrival to ED. Likely a primary respiratory arrest. ROSC with 2 rounds CPR.  Neurologically recovered. -Continue supportive care  ESRD on HD Hypercalcemia, acute- likely 2/2 bone demineralization from immobility Hypokalemia -Continue CRRT; appreciate nephrology's recommendations -Renally dose medications and avoid nephrotoxic meds  Atrial fibrillation with RVR: on warfarin, metoprolol at home. -Continue PTA metoprolol at reduced dose -Continue amiodarone infusion -Holding anticoagulation due to aortic dissection  Anemia of chronic disease Thrombocytopenia likely 2/2 acute illness -Continue to monitor -Transfuse for hemoglobin less than 7 or hemodynamically significant bleeding  DM with hyperglycemia, mostly controlled -Increase novolog q4h TF coverage with hold parameters-5 units -ssi PRN -goal BG 140-180 while admitted  [place] -Liberalize visitation. -We will discuss with son, come to bedside today.  Deviously has been leaning towards withdrawal of care, but are struggling with this decision.  Best practice:  Diet: NPO - tube feeds Pain/Anxiety/Delirium protocol (  if indicated): Fentanyl infusion, started enteral quetiapine VAP protocol (if indicated): Bundle in  place DVT prophylaxis: heparin gtt on hold GI prophylaxis: PPI Glucose control: SSI Mobility: BR Code Status: DNR Family Communication: We will update sons at bedside today Disposition: ICU    This patient is critically ill with multiple organ system failure which requires frequent high complexity decision making, assessment, support, evaluation, and titration of therapies. This was completed through the application of advanced monitoring technologies and extensive interpretation of multiple databases. During this encounter critical care time was devoted to patient care services described in this note for 32 minutes.   Julian Hy, DO 01/31/20 10:02 AM [facility]

## 2020-01-31 NOTE — Progress Notes (Signed)
[facility] Progress Note  Subjective: I/O -   Running even with CRRT-  No major change-  mod dose pressors.  Chest CT did reveal aortic dissection and pericardial effusion-  Made DNR and per the notes likely for terminal extubation in the coming days   Vitals:   01/31/20 0352 01/31/20 0400 01/31/20 0500 01/31/20 0600  BP: (!) 144/43     Pulse: 69 71 66 75  Resp: (!) 24 (!) 24 (!) 24 (!) 24  Temp:  (!) 97 F (36.1 C)    TempSrc:  Axillary    SpO2: 100% 100% 100% 100%  Weight:   89.8 kg   Height:        Exam: Gen on vent, arouses and responds appropriately- -  left IJ TDC in for 11 days No jvd or bruits Chest occ rhonchi, improved, no wheezing RRR no MRG Abd soft ntnd no mass or ascites +bs Ext pitting edema to dep areas Neuro responsive LFA AVF+bruit      OP HD: [facility]   3.5h  2/2.5 bath  97.5kg  No heparin  LFA AVF  - EPO 6600 q Rx   Assessment/ Plan: 1. Cardiac arrest - resuscitated in ED-  Is awake 2. Sepsis/ shock/ PNA - getting IV abx, still requiring pressor support. Getting midodrine at 10 tid. Hx prior admits for PNA. Not making much progress  3. Acute resp failure - on vent,  CXR w persistent edema the other day, UF as able-  Had prompt failure of extubation over the weekend-  4. ESRD - on HD MWF.  CRRT started on 6/15 due to shock. Continue. Using 2K pre filter and dialysate, post filter 4K.   Need to continue CRRT as req pressors  - IJ vascath 68 days old-  AVF is patent 5. BP/ volume -  successful UF but needed to go up on pressors-  Now running even 6. MBD ckd -  Ca up slightly. P repleted 6/22 7. Anemia ckd - Hb 9.1 today, transfuse prn.  darbe 100ug  weekly while here. Tsat 89%, no need for IV Fe. Have increased ESA this week 8. Hyperkalemia - improved with change in dialysate-  Will change to all 4 bags  9. Atrial fibrillation - getting metoprolol 25 bid for rate control 10. Hypercalcemia-  Developed in house-  Maybe due to  being sedentary-  No action yet 11. Dispo-  Heading toward comfort care when family is ready, is difficult as pt is awake but still pressor dep and would not be able to be extubated it seems-  Feel free to stop CRRT any time in that process      Louis Meckel  01/31/2020, 6:56 AM   Recent Labs  Lab 01/30/20 0314 01/30/20 0314 01/30/20 1540 01/31/20 0410  K 3.8   < > 3.5 3.3*  BUN 31*   < > 33* 32*  CREATININE 1.57*   < > 1.56* 1.52*  CALCIUM 10.6*   < > 10.6* 10.5*  PHOS 3.3   < > 3.6 3.3  HGB 9.2*  --   --  9.1*   < > = values in this interval not displayed.   Inpatient medications:  B-complex with vitamin C  1 tablet Per Tube Daily   chlorhexidine gluconate (MEDLINE KIT)  15 mL Mouth Rinse BID   Chlorhexidine Gluconate Cloth  6 each Topical Daily   darbepoetin (ARANESP) injection - DIALYSIS  100 mcg Intravenous Q Wed-HD   feeding  supplement (PRO-STAT SUGAR FREE 64)  30 mL Per Tube BID   fludrocortisone  0.1 mg Per Tube Daily   hydrocortisone sod succinate (SOLU-CORTEF) inj  50 mg Intravenous Q6H   insulin aspart  0-6 Units Subcutaneous Q4H   insulin aspart  4 Units Subcutaneous Q4H   ipratropium-albuterol  3 mL Nebulization BID   levothyroxine  75 mcg Per Tube Q0600   mouth rinse  15 mL Mouth Rinse 10 times per day   metoprolol tartrate  25 mg Per Tube BID   midodrine  10 mg Per Tube TID   pantoprazole sodium  40 mg Per Tube Q1200   QUEtiapine  50 mg Per Tube BID   sodium chloride flush  10-40 mL Intracatheter Q12H     prismasol BGK 4/2.5 200 mL/hr at 01/31/20 0240   sodium chloride 10 mL/hr at 01/31/20 0600   amiodarone 30 mg/hr (01/31/20 0600)   cefTRIAXone (ROCEPHIN)  IV Stopped (01/30/20 1340)   feeding supplement (VITAL AF 1.2 CAL) 1,000 mL (01/31/20 0518)   fentaNYL infusion INTRAVENOUS 75 mcg/hr (01/31/20 0600)   phenylephrine (NEO-SYNEPHRINE) Adult infusion 190 mcg/min (01/31/20 0600)   prismasol BGK 2/2.5 dialysis solution  1,500 mL/hr at 01/31/20 0518   prismasol BGK 2/2.5 replacement solution 400 mL/hr at 01/31/20 0239   vasopressin (PITRESSIN) infusion - *FOR SHOCK* 0.03 Units/min (01/31/20 0600)   sodium chloride, alteplase, docusate, fentaNYL, heparin, lip balm, midazolam, polyethylene glycol, sodium chloride, sodium chloride flush

## 2020-02-01 LAB — CBC WITH DIFFERENTIAL/PLATELET
Band Neutrophils: 0 %
Basophils Absolute: 0 10*3/uL (ref 0.0–0.1)
Basophils Relative: 0 %
Blasts: 0 %
Eosinophils Absolute: 0 10*3/uL (ref 0.0–0.5)
Eosinophils Relative: 0 %
HCT: 27.9 % — ABNORMAL LOW (ref 36.0–46.0)
Hemoglobin: 8.7 g/dL — ABNORMAL LOW (ref 12.0–15.0)
Lymphocytes Relative: 16 %
Lymphs Abs: 3.9 10*3/uL (ref 0.7–4.0)
MCH: 35.2 pg — ABNORMAL HIGH (ref 26.0–34.0)
MCHC: 31.2 g/dL (ref 30.0–36.0)
MCV: 113 fL — ABNORMAL HIGH (ref 80.0–100.0)
Metamyelocytes Relative: 0 %
Monocytes Absolute: 2.7 10*3/uL — ABNORMAL HIGH (ref 0.1–1.0)
Monocytes Relative: 11 %
Myelocytes: 0 %
Neutro Abs: 17.5 10*3/uL — ABNORMAL HIGH (ref 1.7–7.7)
Neutrophils Relative %: 73 %
Platelets: 153 10*3/uL (ref 150–400)
Promyelocytes Relative: 0 %
RBC: 2.47 MIL/uL — ABNORMAL LOW (ref 3.87–5.11)
RDW: 23.2 % — ABNORMAL HIGH (ref 11.5–15.5)
WBC: 24.1 10*3/uL — ABNORMAL HIGH (ref 4.0–10.5)
nRBC: 0 /100 WBC
nRBC: 1 % — ABNORMAL HIGH (ref 0.0–0.2)

## 2020-02-01 LAB — RENAL FUNCTION PANEL
Albumin: 2.6 g/dL — ABNORMAL LOW (ref 3.5–5.0)
Albumin: 2.5 g/dL — ABNORMAL LOW (ref 3.5–5.0)
Anion gap: 12 (ref 5–15)
Anion gap: 13 (ref 5–15)
BUN: 35 mg/dL — ABNORMAL HIGH (ref 8–23)
BUN: 32 mg/dL — ABNORMAL HIGH (ref 8–23)
CO2: 24 mmol/L (ref 22–32)
CO2: 24 mmol/L (ref 22–32)
Calcium: 10.2 mg/dL (ref 8.9–10.3)
Calcium: 10.3 mg/dL (ref 8.9–10.3)
Chloride: 101 mmol/L (ref 98–111)
Chloride: 100 mmol/L (ref 98–111)
Creatinine, Ser: 1.7 mg/dL — ABNORMAL HIGH (ref 0.44–1.00)
Creatinine, Ser: 1.54 mg/dL — ABNORMAL HIGH (ref 0.44–1.00)
GFR calc Af Amer: 32 mL/min — ABNORMAL LOW (ref >60)
GFR calc Af Amer: 37 mL/min — ABNORMAL LOW (ref >60)
GFR calc non Af Amer: 28 mL/min — ABNORMAL LOW (ref >60)
GFR calc non Af Amer: 32 mL/min — ABNORMAL LOW (ref >60)
Glucose, Bld: 214 mg/dL — ABNORMAL HIGH (ref 70–99)
Glucose, Bld: 172 mg/dL — ABNORMAL HIGH (ref 70–99)
Phosphorus: 3.1 mg/dL (ref 2.5–4.6)
Phosphorus: 3.6 mg/dL (ref 2.5–4.6)
Potassium: 3.8 mmol/L (ref 3.5–5.1)
Potassium: 4.4 mmol/L (ref 3.5–5.1)
Sodium: 137 mmol/L (ref 135–145)
Sodium: 137 mmol/L (ref 135–145)

## 2020-02-01 LAB — GLUCOSE, CAPILLARY
Glucose-Capillary: 130 mg/dL — ABNORMAL HIGH (ref 70–99)
Glucose-Capillary: 139 mg/dL — ABNORMAL HIGH (ref 70–99)
Glucose-Capillary: 148 mg/dL — ABNORMAL HIGH (ref 70–99)
Glucose-Capillary: 152 mg/dL — ABNORMAL HIGH (ref 70–99)
Glucose-Capillary: 158 mg/dL — ABNORMAL HIGH (ref 70–99)
Glucose-Capillary: 162 mg/dL — ABNORMAL HIGH (ref 70–99)

## 2020-02-01 LAB — PROTIME-INR
INR: 1.2 (ref 0.8–1.2)
Prothrombin Time: 14.5 seconds (ref 11.4–15.2)

## 2020-02-01 LAB — APTT: aPTT: 30 seconds (ref 24–36)

## 2020-02-01 LAB — MAGNESIUM: Magnesium: 2.5 mg/dL — ABNORMAL HIGH (ref 1.7–2.4)

## 2020-02-01 NOTE — Progress Notes (Signed)
NAME:  Sabrina Beck, MRN:  [id number], DOB:  [birth year], LOS: 8 ADMISSION DATE:  01/22/2020, CONSULTATION DATE: 6/14 REFERRING MD: Dr. Vanita Panda EDP, CHIEF COMPLAINT: Cardiac arrest  Brief History   79 year old female with ESRD on HD presented to [facility] emergency department 6/14 with respiratory distress and suffered cardiac arrest immediately upon arrival.  Brief arrest and responsive after.  Intubated during arrest and transferred to [facility] ED for ICU admission.  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  She has been compliant with dialysis and has reportedly not missed a treatment.  Last treatment 4/11.  In the morning hours of 6/14 when her son who she lives with attempted to wake her up for dialysis she was difficult to arouse and complained of trouble breathing.  He transported her to the emergency department and she became unresponsive as they pulled into the parking lot.  ED staff immediately realized she was pulseless and began resuscitative efforts.  Presenting rhythm was asystole.  The patient underwent what sounds like 2 rounds of CPR including epinephrine, atropine, and hyperkalemia treatment.  There was concern for STEMI based on rhythm change however, EKG was reviewed by cardiology who was not quite as concerned.  The patient was transferred to [facility] emergency department with plans for admission to ICU when bed became available.  In [facility] emergency department the patient was awake on the ventilator and able to respond to nursing.  She was given given sedation for vent comfort.  She was found to be hypotensive and was started on phenylephrine IV infusion. PCCM called for admission.   Past Medical History   has a  past medical history of Anemia, Atrial fibrillation ([place]), Chest pain (40/98/1191), Diastolic heart failure ([place]), Diverticulosis, ESRD on hemodialysis ([place]), GERD (gastroesophageal reflux disease), Headache, Mixed hyperlipidemia, Peripheral neuropathy, PONV (postoperative nausea and vomiting), Systemic hypertension, and Uterine cancer ([place]).   Significant Hospital Events   6/14 admit for cardiac arrest 6/19 failed extubation attempt  Consults:  Nephrology Vascular surgery   Procedures:  ETT 6/14 > 6/19. 6/19 >  CVL 6/14 >  Significant Diagnostic Tests:  CXR on presentation: R>L infiltrate which is evolving  CTA C/A/P 6/23: type 2 aortic dissection, small pericardial effusion, small left hemothorax  Micro Data:  Blood 6/14 > NG Tracheal aspirate 6/21 > rare WBC, rare budding yeast Trach aspirate 6/23> GNR Trach aspirate 6/23> Serratia marcescens (R: cefazolin) Blood 6/23>   Antimicrobials:  Cefepime 6/14 > 6/21 Vancomycin 6/14 , 6/23 meropenem 6/23- 6/24 Ceftriaxone 6/25>  Interim history/subjective:  Failed SBT due to agitation and low Vt yesterday after ~ 4  Hours. Increasing sedation & pressor requirements overnight.  Still requiring high frequency of suctioning.  Objective   Blood pressure (!) 146/42, pulse 72, temperature (!) 97 F (36.1 C), temperature source Axillary, resp. rate 19, height 5\' 4"  (1.626 m), weight 89.6 kg, SpO2 100 %.    Vent Mode: CPAP;PSV FiO2 (%):  [30 %-40 %] 30 % Set Rate:  [24 bmp] 24 bmp Vt Set:  [430 mL] 430 mL PEEP:  [5 cmH20] 5 cmH20 Pressure Support:  [5 cmH20-10 cmH20] 5 cmH20 Plateau Pressure:  [18 cmH20-22 cmH20] 18 cmH20   Intake/Output Summary (Last 24 hours) at 02/01/2020 4782 Last data filed at  02/01/2020 0800 Gross per 24 hour  Intake 3559.27 ml  Output 3474 ml  Net 85.27 ml   Filed Weights   01/30/20 0500 01/31/20 0500 02/01/20 0500  Weight: 91.8 kg 89.8 kg 89.6 kg    Examination: General: Frail appearing elderly  woman lying in bed no acute distress, awake and alert, intubated, on minimal sedation HENT: [place]/AT, eyes anicteric, ETT and OGT in place. Lungs: Rhonchi R>L cleared with suctioning, persistent copious tan secretions from ET tube.  Moderate cough.  Breathing comfortably on SBT 5/5 Cardiovascular: Irregular rhythm, regular rate Abdomen: Soft, nontender, nondistended Extremities: No clubbing, cyanosis, or peripheral edema Neuro: RASS equals 0, answering questions by nodding, moving extremities spontaneously.  Globally weak. Derm: Ecchymoses stable on breast and left flank.  Bruising on arms.  Assessment & Plan:   Acute hypoxemic respiratory failure due to RLL community-acquired pneumonia.  Now with Serratia pneumonia. History of multiple pneumonias recently.  Attempted extubation to high flow nasal cannula on 6/19 given increasing agitation and minimal ventilator requirements but promptly failed and required re-intubation-probably due to upper airway edema.  Originally intubated 6/14 -Continue lung protective ventilation; titrate PEEP and FiO2 as required to maintain sats greater than 90% -Daily SAT and SBT.  Tolerating both, but copious secretions continue to limit extubation.  Failed SBT after few hours yesterday, increasing concern that she would be unable to tolerate secretion management. -VAP prevention protocol  -Continue ceftriaxone to complete 7-day course -Continue fentanyl as needed to maintain vent tolerance and pain control  Critically ill due to shock- septic most likely.  Chronically requiring midodrine at home. -Appreciate nephrology's assistance.  Continue CRRT. -Continue vasopressors as needed to edema of greater than 65.  Trying to limit SBP less than 120 and heart rate less than 60 for aortic dissection -Continue stress dose steroids -Continue arterial line for invasive blood pressure monitoring  Type 2 aortic dissection, type 2-subacute, most likely traumatic from chest  compressions -Appreciate cardiothoracic surgery's input  -Continue medical management; although not at goal due to wide pulse pressure and difficult to control A. fib, she does not seem to be acutely worsening.  Hemoglobin stable.  Continue to work towards goal SBP less than 120 and heart rate less than 60.   -My concerns have been discussed with her children, who understand the gravity of this diagnosis, especially as her heart rate and blood pressure would be more difficult to control post extubation with discontinuation of pain medications and significantly increased respiratory effort or coughing.  Will discuss with her children again today at bedside  Cardiac arrest: PEA/Asystole. Awake and following commands on arrival to ED. Likely a primary respiratory arrest. ROSC with 2 rounds CPR.  Neurologically recovered. -Continue supportive care  ESRD on HD Hypercalcemia, acute- likely 2/2 bone demineralization from immobility Hypokalemia -Continue CRRT; appreciate nephrology's recommendations -Renally dose medications -Strict I's/O  Atrial fibrillation with RVR: on warfarin, metoprolol at home. -Continue PTA metoprolol at reduced dose -Continue amiodarone infusion -Holding anticoagulation due to aortic dissection  Anemia of chronic disease Thrombocytopenia likely 2/2 acute illness -Continue to monitor -Transfuse for hemoglobin less than 7 or hemodynamically significant bleeding  DM with hyperglycemia, mostly controlled -Continue NovoLog tube feed coverage every 4 hours-5 units -ssi PRN -goal BG 140-180 while admitted  [place] -Daily discussions with both sons at bedside.  They understand my concerns she is a poor candidate for tracheostomy long-term and I do not think that she will be able to liberate from the ventilator at this time,  now making it ~ 2 weeks on the vent. Recommend terminal extubation with supportive care, but transition to comfort if unsuccessful.  Best practice:  Diet:  NPO - tube feeds Pain/Anxiety/Delirium protocol (if indicated): Fentanyl infusion, started enteral quetiapine VAP protocol (if indicated): Bundle in place DVT prophylaxis: heparin gtt on hold GI prophylaxis: PPI Glucose control: SSI Mobility: BR Code Status: DNR Family Communication:  will update sons at bedside today Disposition: ICU    This patient is critically ill with multiple organ system failure which requires frequent high complexity decision making, assessment, support, evaluation, and titration of therapies. This was completed through the application of advanced monitoring technologies and extensive interpretation of multiple databases. During this encounter critical care time was devoted to patient care services described in this note for 35 minutes.   Julian Hy, DO 02/01/20 11:35 AM [facility]

## 2020-02-01 NOTE — Progress Notes (Signed)
[facility] Progress Note  Subjective: I/O -   Running even with CRRT-  No major change-  mod dose pressors.  Chest CT did reveal aortic dissection and pericardial effusion-  Made DNR and per the notes likely for terminal extubation in the coming days   Vitals:   02/01/20 0400 02/01/20 0500 02/01/20 0600 02/01/20 0700  BP:      Pulse: 72 78 86 72  Resp: (!) 21 19 17 19   Temp:      TempSrc:      SpO2: 100% 100% 100% 100%  Weight:  89.6 kg    Height:        Exam: Gen on vent, arouses and responds appropriately- -  left IJ TDC in for 12 days No jvd or bruits Chest occ rhonchi, improved, no wheezing RRR no MRG Abd soft ntnd no mass or ascites +bs Ext pitting edema to dep areas Neuro responsive LFA AVF+bruit      OP HD: [facility]   3.5h  2/2.5 bath  97.5kg  No heparin  LFA AVF  - EPO 6600 q Rx   Assessment/ Plan: 1. Cardiac arrest - resuscitated in ED-  Is awake 2. Sepsis/ shock/ PNA - getting IV abx, still requiring pressor support. Getting midodrine at 10 tid. Hx prior admits for PNA. Not making much progress  3. Acute resp failure - on vent,  CXR w persistent edema the other day, UF as able-  Had prompt failure of extubation over the weekend-  4. ESRD - on HD MWF.  CRRT started on 6/15 due to shock. Continue. Using all 4K.   Need to continue CRRT as req pressors  - IJ vascath 22 days old-  AVF is patent 5. BP/ volume -  successful UF but needed to go up on pressors-  Now running even 6. MBD ckd -  Ca up slightly. P repleted 6/22 7. Anemia ckd - Hb 9.1 today, transfuse prn.  darbe 100ug  weekly while here. Tsat 89%, no need for IV Fe. Have increased ESA this week 8. Hyperkalemia - improved with change in dialysate-  Will change to all 4 bags  9. Atrial fibrillation - getting metoprolol 25 bid for rate control 10. Hypercalcemia-  Developed in house-  Maybe due to being sedentary-  No action yet 11. Dispo-  Heading toward comfort care when family is  ready, is difficult as pt is awake but still pressor dep and would not be able to be extubated it seems-  Feel free to stop CRRT any time in that process      Louis Meckel  02/01/2020, 7:34 AM   Recent Labs  Lab 01/31/20 0410 01/31/20 0410 01/31/20 1732 02/01/20 0354  K 3.3*   < > 3.8 3.8  BUN 32*   < > 33* 35*  CREATININE 1.52*   < > 1.62* 1.70*  CALCIUM 10.5*   < > 10.1 10.2  PHOS 3.3   < > 4.4 3.1  HGB 9.1*  --   --  8.7*   < > = values in this interval not displayed.   Inpatient medications: . B-complex with vitamin C  1 tablet Per Tube Daily  . chlorhexidine gluconate (MEDLINE KIT)  15 mL Mouth Rinse BID  . Chlorhexidine Gluconate Cloth  6 each Topical Daily  . darbepoetin (ARANESP) injection - DIALYSIS  100 mcg Intravenous Q Wed-HD  . feeding supplement (PRO-STAT SUGAR FREE 64)  30 mL Per Tube BID  . fludrocortisone  0.1 mg Per Tube Daily  . hydrocortisone sod succinate (SOLU-CORTEF) inj  50 mg Intravenous Q6H  . insulin aspart  0-6 Units Subcutaneous Q4H  . insulin aspart  5 Units Subcutaneous Q4H  . ipratropium-albuterol  3 mL Nebulization BID  . levothyroxine  75 mcg Per Tube Q0600  . mouth rinse  15 mL Mouth Rinse 10 times per day  . metoprolol tartrate  25 mg Per Tube BID  . midodrine  10 mg Per Tube TID  . pantoprazole sodium  40 mg Per Tube Q1200  . QUEtiapine  50 mg Per Tube BID  . sodium chloride flush  10-40 mL Intracatheter Q12H   .  prismasol BGK 4/2.5 200 mL/hr at 01/31/20 2013  .  prismasol BGK 4/2.5 300 mL/hr at 01/31/20 2012  . sodium chloride 250 mL (01/31/20 0736)  . amiodarone 30 mg/hr (02/01/20 0722)  . cefTRIAXone (ROCEPHIN)  IV Stopped (01/31/20 1456)  . feeding supplement (VITAL AF 1.2 CAL) 1,000 mL (02/01/20 0111)  . fentaNYL infusion INTRAVENOUS 100 mcg/hr (02/01/20 0722)  . phenylephrine (NEO-SYNEPHRINE) Adult infusion 290 mcg/min (02/01/20 0722)  . prismasol BGK 4/2.5 1,500 mL/hr at 02/01/20 0506  . vasopressin (PITRESSIN)  infusion - *FOR SHOCK* 0.03 Units/min (02/01/20 0722)   sodium chloride, alteplase, docusate, fentaNYL, heparin, heparin, lip balm, midazolam, polyethylene glycol, sodium chloride, sodium chloride flush

## 2020-02-01 NOTE — Care Plan (Signed)
Updated sons [name] and [name] again today. They understand that her secretions have not improved and she failed on PS 5 +CPAP 5 within a few hours yesterday, making her highly unlikely to be successful without MV. They understand my concern that her previous pneumonias, weakness, chronic hypotension, ongoing shock requiring pressors and CRRT make it unlikely that she will benefit from a tracheostomy and it increases the risk of prolonging the dying process. I have recommended a terminal extubation with full support, but if she fails we focus on her comfort. They have said again they will talk about it and do not feel that they have to make a decision soon. I have let them know that a decision needs to be made tomorrow because she has been on the vent 2 weeks and cannot does not benefit from remaining endotracheally intubated beyond about 2 weeks.  Julian Hy, DO 02/01/20 1:24 PM [facility]

## 2020-02-02 LAB — CBC WITH DIFFERENTIAL/PLATELET
Abs Immature Granulocytes: 0 10*3/uL (ref 0.00–0.07)
Basophils Absolute: 0 10*3/uL (ref 0.0–0.1)
Basophils Relative: 0 %
Eosinophils Absolute: 0 10*3/uL (ref 0.0–0.5)
Eosinophils Relative: 0 %
HCT: 25.8 % — ABNORMAL LOW (ref 36.0–46.0)
Hemoglobin: 8 g/dL — ABNORMAL LOW (ref 12.0–15.0)
Lymphocytes Relative: 10 %
Lymphs Abs: 1.8 10*3/uL (ref 0.7–4.0)
MCH: 35.1 pg — ABNORMAL HIGH (ref 26.0–34.0)
MCHC: 31 g/dL (ref 30.0–36.0)
MCV: 113.2 fL — ABNORMAL HIGH (ref 80.0–100.0)
Monocytes Absolute: 0.9 10*3/uL (ref 0.1–1.0)
Monocytes Relative: 5 %
Neutro Abs: 15.6 10*3/uL — ABNORMAL HIGH (ref 1.7–7.7)
Neutrophils Relative %: 85 %
Platelets: 142 10*3/uL — ABNORMAL LOW (ref 150–400)
RBC: 2.28 MIL/uL — ABNORMAL LOW (ref 3.87–5.11)
RDW: 22.8 % — ABNORMAL HIGH (ref 11.5–15.5)
WBC: 18.3 10*3/uL — ABNORMAL HIGH (ref 4.0–10.5)
nRBC: 0.7 % — ABNORMAL HIGH (ref 0.0–0.2)
nRBC: 2 /100 WBC — ABNORMAL HIGH

## 2020-02-02 LAB — CULTURE, BLOOD (ROUTINE X 2)
Culture: NO GROWTH
Culture: NO GROWTH
Special Requests: ADEQUATE
Special Requests: ADEQUATE

## 2020-02-02 LAB — PROTIME-INR
INR: 1.1 (ref 0.8–1.2)
Prothrombin Time: 14 seconds (ref 11.4–15.2)

## 2020-02-02 LAB — GLUCOSE, CAPILLARY
Glucose-Capillary: 107 mg/dL — ABNORMAL HIGH (ref 70–99)
Glucose-Capillary: 131 mg/dL — ABNORMAL HIGH (ref 70–99)
Glucose-Capillary: 142 mg/dL — ABNORMAL HIGH (ref 70–99)
Glucose-Capillary: 143 mg/dL — ABNORMAL HIGH (ref 70–99)
Glucose-Capillary: 150 mg/dL — ABNORMAL HIGH (ref 70–99)
Glucose-Capillary: 162 mg/dL — ABNORMAL HIGH (ref 70–99)
Glucose-Capillary: 182 mg/dL — ABNORMAL HIGH (ref 70–99)

## 2020-02-02 LAB — RENAL FUNCTION PANEL
Albumin: 2.6 g/dL — ABNORMAL LOW (ref 3.5–5.0)
Albumin: 2.6 g/dL — ABNORMAL LOW (ref 3.5–5.0)
Anion gap: 13 (ref 5–15)
Anion gap: 11 (ref 5–15)
BUN: 33 mg/dL — ABNORMAL HIGH (ref 8–23)
BUN: 27 mg/dL — ABNORMAL HIGH (ref 8–23)
CO2: 23 mmol/L (ref 22–32)
CO2: 25 mmol/L (ref 22–32)
Calcium: 10.1 mg/dL (ref 8.9–10.3)
Calcium: 10.4 mg/dL — ABNORMAL HIGH (ref 8.9–10.3)
Chloride: 101 mmol/L (ref 98–111)
Chloride: 102 mmol/L (ref 98–111)
Creatinine, Ser: 1.58 mg/dL — ABNORMAL HIGH (ref 0.44–1.00)
Creatinine, Ser: 1.34 mg/dL — ABNORMAL HIGH (ref 0.44–1.00)
GFR calc Af Amer: 35 mL/min — ABNORMAL LOW (ref >60)
GFR calc Af Amer: 43 mL/min — ABNORMAL LOW (ref >60)
GFR calc non Af Amer: 31 mL/min — ABNORMAL LOW (ref >60)
GFR calc non Af Amer: 37 mL/min — ABNORMAL LOW (ref >60)
Glucose, Bld: 176 mg/dL — ABNORMAL HIGH (ref 70–99)
Glucose, Bld: 137 mg/dL — ABNORMAL HIGH (ref 70–99)
Phosphorus: 3.2 mg/dL (ref 2.5–4.6)
Phosphorus: 3.3 mg/dL (ref 2.5–4.6)
Potassium: 4.5 mmol/L (ref 3.5–5.1)
Potassium: 4.3 mmol/L (ref 3.5–5.1)
Sodium: 137 mmol/L (ref 135–145)
Sodium: 138 mmol/L (ref 135–145)

## 2020-02-02 LAB — MAGNESIUM: Magnesium: 2.5 mg/dL — ABNORMAL HIGH (ref 1.7–2.4)

## 2020-02-02 LAB — APTT: aPTT: 30 seconds (ref 24–36)

## 2020-02-02 MED ORDER — VITAL 1.5 CAL PO LIQD
50.0000 mL/h | ORAL | Status: DC
  Administered 2020-02-02 – 2020-02-06 (×5): 50 mL/h
  Filled 2020-02-02 (×8): qty 1000

## 2020-02-02 MED ORDER — METOPROLOL TARTRATE 25 MG PO TABS
25.0000 mg | ORAL_TABLET | Freq: Two times a day (BID) | ORAL | Status: DC
  Administered 2020-02-02: 25 mg via ORAL
  Filled 2020-02-02: qty 1

## 2020-02-02 MED ORDER — B COMPLEX-C PO TABS
1.0000 | ORAL_TABLET | Freq: Every day | ORAL | Status: DC
  Administered 2020-02-03: 1 via ORAL
  Filled 2020-02-02 (×2): qty 1

## 2020-02-02 MED ORDER — FLUDROCORTISONE ACETATE 0.1 MG PO TABS
0.1000 mg | ORAL_TABLET | Freq: Every day | ORAL | Status: DC
  Administered 2020-02-03: 0.1 mg via ORAL
  Filled 2020-02-02: qty 1

## 2020-02-02 MED ORDER — LEVOTHYROXINE SODIUM 75 MCG PO TABS: 75.0000 ug | ORAL_TABLET | Freq: Every day | ORAL | Status: DC

## 2020-02-02 MED ORDER — MIDODRINE HCL 5 MG PO TABS
10.0000 mg | ORAL_TABLET | Freq: Three times a day (TID) | ORAL | Status: DC
  Administered 2020-02-02 – 2020-02-03 (×4): 10 mg via ORAL
  Filled 2020-02-02 (×4): qty 2

## 2020-02-02 MED ORDER — PANTOPRAZOLE SODIUM 40 MG PO TBEC
40.0000 mg | DELAYED_RELEASE_TABLET | Freq: Every day | ORAL | Status: DC
  Administered 2020-02-03: 40 mg via ORAL
  Filled 2020-02-02: qty 1

## 2020-02-02 MED ORDER — PRO-STAT SUGAR FREE PO LIQD
30.0000 mL | Freq: Every day | ORAL | Status: DC
  Administered 2020-02-02 – 2020-02-06 (×5): 30 mL
  Filled 2020-02-02 (×5): qty 30

## 2020-02-02 MED ORDER — IPRATROPIUM-ALBUTEROL 0.5-2.5 (3) MG/3ML IN SOLN: 3.0000 mL | RESPIRATORY_TRACT | Status: DC | PRN

## 2020-02-02 NOTE — Progress Notes (Signed)
Nutrition Follow-up  DOCUMENTATION CODES:   Obesity unspecified  INTERVENTION:   Tube feeding via [place]ak: Vital 1.5 at 50 ml/hr Pro-Stat 30 mL daily Provides 96 g of protein, 1900 kcals and 912 mL of free water Meets 100% estimated calorie and protein needs  Continue b-complex with C   NUTRITION DIAGNOSIS:   Inadequate oral intake related to inability to eat as evidenced by NPO status.  Being addressed via TF   GOAL:   Provide needs based on ASPEN/SCCM guidelines  Progressing  MONITOR:   Vent status, Labs, Weight trends, Skin, I & O's  REASON FOR ASSESSMENT:   Ventilator    ASSESSMENT:   79 year old female who presented on 6/14 with respiratory distress and cardiac arrest immediately upon arrival to the ED. Pt required intubation. PMH of ESRD on HD, atrial fibrillation, CHF. Pt did not require TTM.  6/14 Admit post cardiac arrest, Intubated 6/15 CRRT initiated 6/19 Extubated, Re-Intubated 6/28 Extubated, Cortrak Inserted  Pt remains on CRRT  Noted MAPs have been less than 65, goal has been SBP>90 per MD notes. Currently weaning pressors per RN  NPO, Cortrak inserted today. Pt receiving Vital AF 1.2 at 55 ml/hr prior to extubation   Current weight 88.3 kg; admit weight 98 kg. Net negative 6.5 L. Plan to utilize weight of 88 kg for estimating nutritional needs  Labs: reviewed Meds: B-complex with C, ss novolog  Diet Order:   Diet Order            Diet NPO time specified  Diet effective now                 EDUCATION NEEDS:   No education needs have been identified at this time  Skin:  Skin Assessment: Reviewed RN Assessment (MASD to groin, breast)  Last BM:  6/28 rectal tube  Height:   Ht Readings from Last 1 Encounters:  01/13/2020 5\' 4"  (1.626 m)    Weight:   Wt Readings from Last 1 Encounters:  02/02/20 88.3 kg    Ideal Body Weight:  54.5 kg  BMI:  Body mass index is 33.41 kg/m.  Estimated Nutritional Needs:   Kcal:   1700-1900 kcals  Protein:  90-110 g  Fluid:  1000 ml + UOP    Kerman Passey MS, RDN, LDN, CNSC Registered Dietitian III RD Pager Number and RD On-Call Pager Number Located in [place]

## 2020-02-02 NOTE — Procedures (Signed)
Cortrak  Person Inserting Tube:  Sabrina Beck, RD Tube Type:  Cortrak - 43 inches Tube Location:  Left nare Initial Placement:  Stomach Secured by: Bridle Technique Used to Measure Tube Placement:  Documented cm marking at nare/ corner of mouth Cortrak Secured At:  65 cm   No x-ray is required. RN may begin using tube.   If the tube becomes dislodged please keep the tube and contact the Cortrak team at [email or web address] (password [email or web address]) for replacement.  If after hours and replacement cannot be delayed, place a NG tube and confirm placement with an abdominal x-ray.    Sabrina Beck RD, LDN Clinical Nutrition Pager listed in AMION    

## 2020-02-02 NOTE — Procedures (Signed)
Extubation Procedure Note  Patient Details:   Name: Sabrina Beck DOB: 04/18/1941 MRN: [id number]   Airway Documentation:    Vent end date: 02/02/20 Vent end time: 1050   Evaluation  O2 sats: stable throughout Complications: No apparent complications Patient did tolerate procedure well. Bilateral Breath Sounds: Rhonchi, Diminished   Yes pt could speak post extubation.  Pt extubated to [facility] per physicians order.   Jorje Guild 02/02/2020, 10:51 AM

## 2020-02-02 NOTE — Progress Notes (Signed)
NAME:  Sabrina Beck, MRN:  [id number], DOB:  [birth year], LOS: 80 ADMISSION DATE:  01/10/2020, CONSULTATION DATE: 6/14 REFERRING MD: Dr. Vanita Panda EDP, CHIEF COMPLAINT: Cardiac arrest  Brief History   79 yo female presented to [place] on 6/14 with respiratory leading to cardiac arrest from pneumonia.    Past Medical History  A fib on coumadin, ESRD on HD M/W/F, Diastolic CHF, Diverticulosis, GERD, Headache, HLD, Neuropathy, HTN, Uterine cancer  Significant Hospital Events   6/14 admit for cardiac arrest 6/19 failed extubation attempt 6/28 extubate  Consults:  Nephrology Cardiothoracic surgery >> s/o 6/24  Procedures:  ETT 6/14 > 6/19  Lt IJ HD cath 6/14 > ETT 6/19 >> 6/28 Rt radial a line 6/23 >>   Significant Diagnostic Tests:  CT head 6/14 >> atrophy CTA C/A/P 6/23 >> type 2 aortic dissection, small pericardial effusion, small left hemothorax Echo 6/23 >> EF 50 to 55%, mild LVH, grade 2 DD, mild elevation in PASP, mod LA dilation  Micro Data:  SARS CoV2 6/14 >> negative MRSA PCR 6/14 >> negative Blood 6/14 >> negative Sputum 6/21 >> negative Sputum 6/23 >> Serratia Blood 6/23 >>   Antimicrobials:  Cefepime 6/14 > 6/21 Vancomycin 6/14 Meropenem 6/23 >> 6/24 Vancomycin 6/23 Rocephin 6/25 >>   Interim history/subjective:  Tolerating pressure support.  Off vasopressin.  Remains on CRRT, amiodarone, phenylephrine.  Objective   Blood pressure (!) 146/42, pulse 73, temperature 98.1 F (36.7 C), temperature source Axillary, resp. rate 13, height 5\' 4"  (1.626 m), weight 88.3 kg, SpO2 100 %.    Vent Mode: PRVC FiO2 (%):  [30 %] 30 % Set Rate:  [24 bmp-34 bmp] 24 bmp Vt Set:  [420 mL] 420 mL PEEP:  [5 cmH20] 5 cmH20 Pressure Support:  [5 cmH20] 5 cmH20 Plateau Pressure:  [15 cmH20-18 cmH20] 18 cmH20   Intake/Output Summary (Last 24 hours) at 02/02/2020 1003 Last data filed at 02/02/2020 0900 Gross per 24 hour  Intake 3034.17 ml  Output 3295 ml  Net -260.83 ml    Filed Weights   01/31/20 0500 02/01/20 0500 02/02/20 0500  Weight: 89.8 kg 89.6 kg 88.3 kg    Examination:  General - alert Eyes - pupils reactive ENT - ETT in place Cardiac - regular rate/rhythm, no murmur Chest - equal breath sounds b/l, no wheezing or rales Abdomen - soft, non tender, + bowel sounds Extremities - no cyanosis, clubbing, or edema Skin - no rashes Neuro - follows commands, moves extremities  Resolved Problems:  Respiratory failure leading to PEA cardiac arrest  Assessment & Plan:   Acute hypoxic respiratory failure from Rt lower lobe CAP complicated by Lt lower lobe HCAP with Serratia. - doing better with pressure support, and not as much secretions since changing ABx on 6/23 - proceed with extubation trial 6/28 - d/w pt >> she would be agreeable to re-intubation if needed - f/u CXR - day 6 of ABx, currently on rocephin - prn BDs  Septic shock from HCAP. - wean pressors to keep SBP > 90 - continue solucortef/florinef while on pressors  ESRD. - renal planning to try and transition off CRRT - continue midodrine  Type B aortic dissection. - likely from trauma in setting of CPR - goal SBP < 120, HR < 70  A fib with RVR with hx of chronic A fib present on admission. Chronic diastolic CHF. - continue amiodarone - hold anticoagulation for now >> if stable after extubation, then likely can resume coumadin on 6/29  Anemia of chronic disease and critical illness. 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 - SSI - hold outpt neurontin for now  Hx of hypothyroidism. - continue synthroid  Dysphagia. - speech therapy to assess after extubation  Deconditioning. - PT/OT  Goals of care. - d/w pt on 6/28 - she would be okay with reintubation if needed - no CPR, no defibrillation  Best practice:  Diet: NPO DVT  prophylaxis: SCDs GI prophylaxis: protonix Mobility: BR Code Status: limited resuscitation Family Communication: spoke with pt's son Disposition: ICU  Labs:   CMP Latest Ref Rng & Units 02/02/2020 02/01/2020 02/01/2020  Glucose 70 - 99 mg/dL 176(H) 172(H) 214(H)  BUN 8 - 23 mg/dL 33(H) 32(H) 35(H)  Creatinine 0.44 - 1.00 mg/dL 1.58(H) 1.54(H) 1.70(H)  Sodium 135 - 145 mmol/L 137 137 137  Potassium 3.5 - 5.1 mmol/L 4.5 4.4 3.8  Chloride 98 - 111 mmol/L 101 100 101  CO2 22 - 32 mmol/L 23 24 24   Calcium 8.9 - 10.3 mg/dL 10.1 10.3 10.2  Total Protein 6.5 - 8.1 g/dL - - -  Total Bilirubin 0.3 - 1.2 mg/dL - - -  Alkaline Phos 38 - 126 U/L - - -  AST 15 - 41 U/L - - -  ALT 0 - 44 U/L - - -    CBC Latest Ref Rng & Units 02/02/2020 02/01/2020 01/31/2020  WBC 4.0 - 10.5 K/uL 18.3(H) 24.1(H) 23.7(H)  Hemoglobin 12.0 - 15.0 g/dL 8.0(L) 8.7(L) 9.1(L)  Hematocrit 36 - 46 % 25.8(L) 27.9(L) 28.7(L)  Platelets 150 - 400 K/uL 142(L) 153 147(L)    ABG    Component Value Date/Time   PHART 7.326 (L) 01/31/2020 0836   PCO2ART 40.8 02/02/2020 0836   PO2ART 83 01/25/2020 0836   HCO3 21.3 02/03/2020 0836   TCO2 22 01/11/2020 0836   ACIDBASEDEF 4.0 (H) 01/11/2020 0836   O2SAT 95.0 02/03/2020 0836    CBG (last 3)  Recent Labs    02/02/20 0004 02/02/20 0401 02/02/20 0713  GLUCAP 162* 150* 142*    CC time: 38 minutes  Chesley Mires, MD [place] Pager - [phone] 02/02/2020, 10:22 AM

## 2020-02-02 NOTE — Evaluation (Signed)
Clinical/Bedside Swallow Evaluation Patient Details  Name: Sabrina Beck MRN: [id number] Date of Birth: 1941/01/26  Today's Date: 02/02/2020 Time: SLP Start Time (ACUTE ONLY): 1110 SLP Stop Time (ACUTE ONLY): 1125 SLP Time Calculation (min) (ACUTE ONLY): 15 min  Past Medical History:  Past Medical History:  Diagnosis Date   Anemia    Atrial fibrillation ([place])    Chest pain 05/15/2006   Stress test negative for ischemia   Diastolic heart failure (HCC)    Diverticulosis    ESRD on hemodialysis (HCC)    GERD (gastroesophageal reflux disease)    Headache    Mixed hyperlipidemia    Peripheral neuropathy    PONV (postoperative nausea and vomiting)    Systemic hypertension    Uterine cancer ([place])    Past Surgical History:  Past Surgical History:  Procedure Laterality Date   ABDOMINAL HYSTERECTOMY     APPENDECTOMY     AV FISTULA PLACEMENT Left 11/17/2013   Procedure: CREATION OF LEFT RADIOCEPHALIC ARTERIOVENOUS (AV) FISTULA ;  Surgeon: [name], MD;  Location[facility] OR;  Service: Vascular;  Laterality: Left;   BREAST SURGERY Left    tumors non canerous 2- 3 different surgeries   CATARACT EXTRACTION W/PHACO Right 10/26/2016   Procedure: CATARACT EXTRACTION PHACO AND INTRAOCULAR LENS PLACEMENT (IOC) CDE= 11.91;  Surgeon: Tonny Branch, MD;  Location: [facility] ORS;  Service: Ophthalmology;  Laterality: Right;  right - pt knows to arrive at 6:30   CATARACT EXTRACTION W/PHACO Left 11/30/2016   Procedure: CATARACT EXTRACTION PHACO AND INTRAOCULAR LENS PLACEMENT (IOC) CDE - 7.92 ;  Surgeon: Tonny Branch, MD;  Location: [facility] ORS;  Service: Ophthalmology;  Laterality: Left;  left   CHOLECYSTECTOMY     COLONOSCOPY W/ BIOPSIES AND POLYPECTOMY     FISTULA SUPERFICIALIZATION Left 05/19/2014   Procedure: FISTULA SUPERFICIALIZATION-LEFT ARM;  Surgeon: [name], MD;  Location: [place];  Service: Vascular;  Laterality: Left;   INSERTION OF DIALYSIS CATHETER N/A 01/24/2014    Procedure: INSERTION OF DIALYSIS CATHETER;  Surgeon: Rosetta Posner, MD;  Location: [facility] OR;  Service: Vascular;  Laterality: N/A;   KNEE ARTHROSCOPY     TUBAL LIGATION     HPI:  79 year old female with ESRD on HD presented to [facility] emergency department 6/14 with respiratory distress and suffered cardiac arrest immediately upon arrival.  Brief arrest and responsive after.  Intubated during arrest and transferred to [facility] ED for ICU admission.  Pt was intubated from 6/14-6/19 and then re-intubated from 6/19-6/28.    Assessment / Plan / Recommendation Clinical Impression  Pt was seen for a bedside swallow evaluation in the setting of recent extubation.  Pt was encountered awake/alert following extubation with two sons present at bedside.  Pt was alert, but she appeared to be confused and she exhibited difficulty following commands.  Pt was unable to complete an oral mechanism examination secondary to difficulty following commands.  RN and sons reported that pt had thick secretions that she was able to mobilize to her oral cavity for suction.  Pt had the [place] in her oral cavity upon arrival and she was noted to use it to suction her saliva and thick secretions.  Pt was observed to have hoarse vocal quality with low vocal intensity.  She exhibited an intermittent congested cough throughout this evaluation in the absence of PO.  Pt consumed minimal trials of an ice chip and 1/2 tsp of water via spoon.  She exhibited good labial closure, but suspect reduced  lingual manipulation, prolonged AP transport, and delayed swallow initiation.  No overt s/sx of aspiration were observed; however, there are concerns for silent prandial aspiration secondary to prolonged intubation and possible reduced airway protection.  Recommend strict NPO at this time with frequent oral care.  SLP will f/u in the next 24-30 hours to re-evaluate readiness for clinical diet initiation vs instrumental swallow study.     SLP  Visit Diagnosis: Dysphagia, unspecified (R13.10)    Aspiration Risk  Risk for inadequate nutrition/hydration;Severe aspiration risk    Diet Recommendation NPO        Other  Recommendations Oral Care Recommendations: Oral care QID;Staff/trained caregiver to provide oral care Other Recommendations: Have oral suction available;Remove water pitcher   Follow up Recommendations Skilled Nursing facility      Frequency and Duration min 2x/week  2 weeks       Prognosis Prognosis for Safe Diet Advancement: Fair      Swallow Study   General Date of Onset: 02/02/20 HPI: 79 year old female with ESRD on HD presented to [facility] emergency department 6/14 with respiratory distress and suffered cardiac arrest immediately upon arrival.  Brief arrest and responsive after.  Intubated during arrest and transferred to [facility] ED for ICU admission.  Pt was intubated from 6/14-6/19 and then re-intubated from 6/19-6/28.  Type of Study: Bedside Swallow Evaluation Previous Swallow Assessment: None Diet Prior to this Study: NPO Temperature Spikes Noted: Yes Respiratory Status: Nasal cannula History of Recent Intubation: Yes Length of Intubations (days): 14 days Date extubated: 02/02/20 Behavior/Cognition: Alert;Confused;Requires cueing Oral Cavity Assessment: Dry Oral Care Completed by SLP: No Oral Cavity - Dentition:  (Unable to evalaute ) Patient Positioning: Upright in bed Baseline Vocal Quality: Hoarse;Low vocal intensity Volitional Cough: Weak;Congested Volitional Swallow: Unable to elicit    Oral/Motor/Sensory Function Overall Oral Motor/Sensory Function:  (Unable to evaluate )   Ice Chips Ice chips: Impaired Presentation: Spoon Oral Phase Impairments: Reduced lingual movement/coordination Pharyngeal Phase Impairments: Suspected delayed Swallow   Thin Liquid Thin Liquid: Impaired Presentation: Spoon Oral Phase Impairments: Reduced lingual movement/coordination Pharyngeal  Phase  Impairments: Suspected delayed Swallow    Nectar Thick Nectar Thick Liquid: Not tested   Honey Thick Honey Thick Liquid: Not tested   Puree Puree: Not tested   Solid     Solid: Not tested     Colin Mulders M.S., CCC-SLP Acute Rehabilitation Services Office: [phone]  Elvia Collum Correen Bubolz 02/02/2020,11:39 AM

## 2020-02-02 NOTE — Progress Notes (Addendum)
[facility] Progress Note  Subjective: I/O even, pressors coming down  Vitals:   02/02/20 0830 02/02/20 0839 02/02/20 0845 02/02/20 0900  BP:      Pulse: 73 64 75 85  Resp: (!) 24 (!) 25 (!) 24 14  Temp:      TempSrc:      SpO2: 100% 100% 100% 100%  Weight:      Height:        Exam: Gen on vent, arouses and responds appropriately- -L IJ HD cath  No jvd or bruits Chest occ rhonchi, improved, no wheezing RRR no MRG Abd soft ntnd no mass or ascites +bs Ext pitting edema to dep areas only Neuro responsive LFA AVF+bruit      OP HD: [facility]   3.5h  2/2.5 bath  97.5kg  No heparin  LFA AVF  - EPO 6600 q Rx   Assessment/ Plan: 1. Cardiac arrest - resuscitated in ED-  Is awake 2. Sepsis/ shock/ PNA - getting IV abx, still requiring pressor support. Getting midodrine at 10 tid. Hx prior admits for PNA. Not making progress, per CCM 3. Type B aortic dissection - prob d/t CPR, seen by TCTS recommending medical management 4. Acute resp failure - on vent 5. ESRD - on HD MWF.  CRRT started on 6/15 due to shock. Continue. Using all 4K.  Will try to wean off if possible.  6. BP/ volume -  successful UF last week and wt's are down sig 7. MBD ckd -  Ca up slightly. P repleted 6/22 8. Anemia ckd - Hb 9.1 today, transfuse prn.  darbe 100ug  weekly while here. Tsat 89%, no need for IV Fe. Have increased ESA this week 9. Hyperkalemia - improved with change in dialysate-  Will change to all 4 bags  10. Atrial fibrillation - getting metoprolol 25 bid for rate control 11. Hypercalcemia-  Developed in house-  Maybe due to being sedentary-  No action yet 12. Dispo-  Heading toward comfort care when family ready, is not able to be extubated -  Feel free to stop CRRT any time in that process      Sol Blazing  02/02/2020, 9:16 AM   Recent Labs  Lab 02/01/20 0354 02/01/20 0354 02/01/20 1646 02/02/20 0415  K 3.8   < > 4.4 4.5  BUN 35*   < > 32* 33*   CREATININE 1.70*   < > 1.54* 1.58*  CALCIUM 10.2   < > 10.3 10.1  PHOS 3.1   < > 3.6 3.2  HGB 8.7*  --   --  8.0*   < > = values in this interval not displayed.   Inpatient medications: . B-complex with vitamin C  1 tablet Per Tube Daily  . chlorhexidine gluconate (MEDLINE KIT)  15 mL Mouth Rinse BID  . Chlorhexidine Gluconate Cloth  6 each Topical Daily  . darbepoetin (ARANESP) injection - DIALYSIS  100 mcg Intravenous Q Wed-HD  . feeding supplement (PRO-STAT SUGAR FREE 64)  30 mL Per Tube BID  . fludrocortisone  0.1 mg Per Tube Daily  . hydrocortisone sod succinate (SOLU-CORTEF) inj  50 mg Intravenous Q6H  . insulin aspart  0-6 Units Subcutaneous Q4H  . insulin aspart  5 Units Subcutaneous Q4H  . ipratropium-albuterol  3 mL Nebulization BID  . levothyroxine  75 mcg Per Tube Q0600  . mouth rinse  15 mL Mouth Rinse 10 times per day  . metoprolol tartrate  25 mg Per  Tube BID  . midodrine  10 mg Per Tube TID  . pantoprazole sodium  40 mg Per Tube Q1200  . QUEtiapine  50 mg Per Tube BID  . sodium chloride flush  10-40 mL Intracatheter Q12H   .  prismasol BGK 4/2.5 200 mL/hr at 02/01/20 2118  .  prismasol BGK 4/2.5 300 mL/hr at 02/02/20 0612  . sodium chloride 250 mL (01/31/20 0736)  . amiodarone 30 mg/hr (02/02/20 0900)  . cefTRIAXone (ROCEPHIN)  IV Stopped (02/01/20 1506)  . feeding supplement (VITAL AF 1.2 CAL) 1,000 mL (02/01/20 2223)  . fentaNYL infusion INTRAVENOUS 100 mcg/hr (02/02/20 0900)  . phenylephrine (NEO-SYNEPHRINE) Adult infusion 70 mcg/min (02/02/20 0900)  . prismasol BGK 4/2.5 1,500 mL/hr at 02/02/20 0823  . vasopressin (PITRESSIN) infusion - *FOR SHOCK* 0.03 Units/min (02/02/20 0900)   sodium chloride, alteplase, docusate, fentaNYL, heparin, heparin, lip balm, midazolam, polyethylene glycol, sodium chloride, sodium chloride flush

## 2020-02-03 ENCOUNTER — Inpatient Hospital Stay (HOSPITAL_COMMUNITY): Payer: Medicare HMO

## 2020-02-03 LAB — APTT: aPTT: 32 seconds (ref 24–36)

## 2020-02-03 LAB — CBC WITH DIFFERENTIAL/PLATELET
Abs Immature Granulocytes: 0.23 10*3/uL — ABNORMAL HIGH (ref 0.00–0.07)
Basophils Absolute: 0 10*3/uL (ref 0.0–0.1)
Basophils Relative: 0 %
Eosinophils Absolute: 0 10*3/uL (ref 0.0–0.5)
Eosinophils Relative: 0 %
HCT: 24.7 % — ABNORMAL LOW (ref 36.0–46.0)
Hemoglobin: 7.8 g/dL — ABNORMAL LOW (ref 12.0–15.0)
Immature Granulocytes: 2 %
Lymphocytes Relative: 14 %
Lymphs Abs: 1.9 10*3/uL (ref 0.7–4.0)
MCH: 36.1 pg — ABNORMAL HIGH (ref 26.0–34.0)
MCHC: 31.6 g/dL (ref 30.0–36.0)
MCV: 114.4 fL — ABNORMAL HIGH (ref 80.0–100.0)
Monocytes Absolute: 1.2 10*3/uL — ABNORMAL HIGH (ref 0.1–1.0)
Monocytes Relative: 9 %
Neutro Abs: 10.4 10*3/uL — ABNORMAL HIGH (ref 1.7–7.7)
Neutrophils Relative %: 75 %
Platelets: 102 10*3/uL — ABNORMAL LOW (ref 150–400)
RBC: 2.16 MIL/uL — ABNORMAL LOW (ref 3.87–5.11)
RDW: 22.7 % — ABNORMAL HIGH (ref 11.5–15.5)
WBC: 13.7 10*3/uL — ABNORMAL HIGH (ref 4.0–10.5)
nRBC: 0.3 % — ABNORMAL HIGH (ref 0.0–0.2)

## 2020-02-03 LAB — MAGNESIUM: Magnesium: 2.6 mg/dL — ABNORMAL HIGH (ref 1.7–2.4)

## 2020-02-03 LAB — GLUCOSE, CAPILLARY
Glucose-Capillary: 129 mg/dL — ABNORMAL HIGH (ref 70–99)
Glucose-Capillary: 139 mg/dL — ABNORMAL HIGH (ref 70–99)
Glucose-Capillary: 164 mg/dL — ABNORMAL HIGH (ref 70–99)
Glucose-Capillary: 166 mg/dL — ABNORMAL HIGH (ref 70–99)
Glucose-Capillary: 173 mg/dL — ABNORMAL HIGH (ref 70–99)
Glucose-Capillary: 180 mg/dL — ABNORMAL HIGH (ref 70–99)

## 2020-02-03 LAB — RENAL FUNCTION PANEL
Albumin: 2.3 g/dL — ABNORMAL LOW (ref 3.5–5.0)
Anion gap: 13 (ref 5–15)
BUN: 30 mg/dL — ABNORMAL HIGH (ref 8–23)
CO2: 22 mmol/L (ref 22–32)
Calcium: 10.2 mg/dL (ref 8.9–10.3)
Chloride: 102 mmol/L (ref 98–111)
Creatinine, Ser: 1.45 mg/dL — ABNORMAL HIGH (ref 0.44–1.00)
GFR calc Af Amer: 39 mL/min — ABNORMAL LOW (ref >60)
GFR calc non Af Amer: 34 mL/min — ABNORMAL LOW (ref >60)
Glucose, Bld: 181 mg/dL — ABNORMAL HIGH (ref 70–99)
Phosphorus: 2.5 mg/dL (ref 2.5–4.6)
Potassium: 3.8 mmol/L (ref 3.5–5.1)
Sodium: 137 mmol/L (ref 135–145)

## 2020-02-03 LAB — PROTIME-INR
INR: 1.2 (ref 0.8–1.2)
Prothrombin Time: 14.4 seconds (ref 11.4–15.2)

## 2020-02-03 MED ORDER — B COMPLEX-C PO TABS
1.0000 | ORAL_TABLET | Freq: Every day | ORAL | Status: DC
  Administered 2020-02-04 – 2020-02-06 (×3): 1
  Filled 2020-02-03 (×5): qty 1

## 2020-02-03 MED ORDER — HEPARIN SODIUM (PORCINE) 1000 UNIT/ML DIALYSIS
1000.0000 [IU] | INTRAMUSCULAR | Status: DC | PRN
  Administered 2020-02-03: 2800 [IU] via INTRAVENOUS_CENTRAL
  Filled 2020-02-03 (×2): qty 6

## 2020-02-03 MED ORDER — LEVOTHYROXINE SODIUM 75 MCG PO TABS
75.0000 ug | ORAL_TABLET | Freq: Every day | ORAL | Status: DC
  Administered 2020-02-03 – 2020-02-07 (×5): 75 ug
  Filled 2020-02-03 (×5): qty 1

## 2020-02-03 MED ORDER — WARFARIN - PHARMACIST DOSING INPATIENT: Freq: Every day | Status: DC

## 2020-02-03 MED ORDER — HYDROCORTISONE NA SUCCINATE PF 100 MG IJ SOLR
50.0000 mg | Freq: Three times a day (TID) | INTRAMUSCULAR | Status: DC
  Administered 2020-02-03 – 2020-02-06 (×11): 50 mg via INTRAVENOUS
  Filled 2020-02-03 (×12): qty 2

## 2020-02-03 MED ORDER — ACETAMINOPHEN 500 MG PO TABS
1000.0000 mg | ORAL_TABLET | Freq: Four times a day (QID) | ORAL | Status: DC | PRN
  Administered 2020-02-03: 1000 mg via ORAL
  Filled 2020-02-03: qty 2

## 2020-02-03 MED ORDER — PANTOPRAZOLE SODIUM 40 MG PO PACK
40.0000 mg | PACK | Freq: Every day | ORAL | Status: DC
  Administered 2020-02-04 – 2020-02-06 (×3): 40 mg
  Filled 2020-02-03 (×4): qty 20

## 2020-02-03 MED ORDER — WARFARIN SODIUM 5 MG PO TABS
10.0000 mg | ORAL_TABLET | Freq: Once | ORAL | Status: AC
  Administered 2020-02-03: 10 mg
  Filled 2020-02-03: qty 2

## 2020-02-03 MED ORDER — ORAL CARE MOUTH RINSE
15.0000 mL | Freq: Two times a day (BID) | OROMUCOSAL | Status: DC
  Administered 2020-02-03 – 2020-02-07 (×8): 15 mL via OROMUCOSAL

## 2020-02-03 MED ORDER — FLUDROCORTISONE ACETATE 0.1 MG PO TABS
0.1000 mg | ORAL_TABLET | Freq: Every day | ORAL | Status: DC
  Administered 2020-02-04 – 2020-02-06 (×3): 0.1 mg
  Filled 2020-02-03 (×4): qty 1

## 2020-02-03 MED ORDER — METOPROLOL TARTRATE 25 MG/10 ML ORAL SUSPENSION
25.0000 mg | Freq: Two times a day (BID) | ORAL | Status: DC
  Administered 2020-02-03 – 2020-02-05 (×5): 25 mg
  Filled 2020-02-03 (×9): qty 10

## 2020-02-03 MED ORDER — ACETAMINOPHEN 160 MG/5ML PO SOLN: 1000.0000 mg | Freq: Four times a day (QID) | ORAL | Status: DC | PRN

## 2020-02-03 MED ORDER — MIDODRINE HCL 5 MG PO TABS
10.0000 mg | ORAL_TABLET | Freq: Three times a day (TID) | ORAL | Status: DC
  Administered 2020-02-03 – 2020-02-07 (×10): 10 mg
  Filled 2020-02-03 (×10): qty 2

## 2020-02-03 MED ORDER — WARFARIN SODIUM 5 MG PO TABS: 10.0000 mg | ORAL_TABLET | Freq: Once | ORAL | Status: DC

## 2020-02-03 NOTE — Progress Notes (Signed)
[place] for warfarin >> Heparin Indication: atrial fibrillation  No Known Allergies  Patient Measurements: Height: 5\' 4"  (162.6 cm) Weight: 93.3 kg (205 lb 11 oz) IBW/kg (Calculated) : 54.7 Heparin Dosing Weight: 75.8 kg  Vital Signs: Temp: 98.8 F (37.1 C) (06/22 1121) Temp Source: Axillary (06/22 1121) BP: 83/58 (06/22 1215) Pulse Rate: 111 (06/22 1215)  Labs: Recent Labs    01/25/20 0231 01/25/20 1549 01/26/20 0331 01/26/20 1633 01/27/20 0308  HGB 7.8*  --  7.8*  --  8.1*  HCT 24.1*  --  25.1*  --  26.1*  PLT 142*  --  136*  --  126*  APTT 32  --  27  --  33  LABPROT 15.0  --  14.8  --  16.3*  INR 1.2  --  1.2  --  1.4*  CREATININE 1.61*   < > 1.66* 1.75* 1.74*   < > = values in this interval not displayed.    Estimated Creatinine Clearance: 28.5 mL/min (A) (by C-G formula based on SCr of 1.74 mg/dL (H)).   Assessment: 43 yof on warfarin PTA for history of afib admitted with respiratory distress with cardiac arrest on arrival to ER with ROSC. Pt switched to heparin in case any procedures needed.  Resume warfarin today per CCM  INR 1.2 today  Goal of Therapy:  INR 2-3 Monitor platelets by anticoagulation protocol: Yes   Plan:  Warfarin 10 mg x 1 Daily INR  Barth Kirks, PharmD, BCPS, BCCCP Clinical Pharmacist [phone]  Please check AMION for all [facility] numbers  02/03/2020 10:28 AM

## 2020-02-03 NOTE — Progress Notes (Signed)
PT Cancellation Note  Patient Details Name: Sabrina Beck MRN: [id number] DOB: 1941-01-30   Cancelled Treatment:    Reason Eval/Treat Not Completed: Fatigue/lethargy limiting ability to participate.  Pt's son's asked to hold due to pt so worn looking, I agree.   Will return 6/30. 02/03/2020  Ginger Carne., PT Acute Rehabilitation Services [phone]  (pager) [phone]  (office)   Tessie Fass [facility] [place] 02/03/2020, 6:46 PM

## 2020-02-03 NOTE — Progress Notes (Signed)
[facility] Progress Note  Subjective: I/O even, pressors coming down  Vitals:   02/03/20 0700 02/03/20 0800 02/03/20 0803 02/03/20 0900  BP: 123/60 107/86  (!) 115/50  Pulse: 78 76  73  Resp: _0 Temp:   97.9 F (36.6 C)   TempSrc:   Axillary   SpO2: 100% 100%  99%  Weight:      Height:        Exam: Gen on vent, arouses and responds appropriately- -L IJ HD cath  No jvd or bruits Chest occ rhonchi, improved, no wheezing RRR no MRG Abd soft ntnd no mass or ascites +bs Ext pitting edema to dep areas only Neuro responsive LFA AVF+bruit   CXR 6/29 - basilar atx, no edema or pvc   OP HD: [facility] MWF  3.5h  2/2.5 bath  97.5kg  No heparin  LFA AVF  - EPO 6600 q Rx   Assessment/ Plan: 1. Cardiac arrest - resuscitated in ED 2. Sepsis/ shock/ PNA - getting IV abx, off pressors and extubated. On midodrine 10 tid 3. Type B aortic dissection - felt likely due to CPR, seen by TCTS and plan is for medical management 4. Acute resp failure - extubated, on room air this am 5. ESRD - on HD MWF.  CRRT started on 6/15, will dc today, pt off pressors and volume under control finally. Plan next HD Thursday  6. BP/ volume -  successful UF last week and down 13kg now.  7. MBD ckd -  Ca up slightly. P repleted 6/22 8. Anemia ckd - Hb 9.1 today, transfuse prn.  darbe 100ug  weekly while here. Tsat 89%, no need for IV Fe. 9. Hyperkalemia - resolved 10. Atrial fibrillation - getting metoprolol 25 bid for rate control 11. Hypercalcemia-  Developed in house-  Maybe due to being immobility, no need Rx yet 12. Partial DNR - PCT working w/ family, is now NO code blue/ cpr/ shock/ meds, YES intubation/ bipap   Kelly Splinter, MD 02/03/2020, 9:20 AM       Recent Labs  Lab 02/02/20 0415 02/02/20 0415 02/02/20 1700 02/03/20 0409  K 4.5   < > 4.3 3.8  BUN 33*   < > 27* 30*  CREATININE 1.58*   < > 1.34* 1.45*  CALCIUM 10.1   < > 10.4* 10.2  PHOS 3.2   < >  3.3 2.5  HGB 8.0*  --   --  7.8*   < > = values in this interval not displayed.   Inpatient medications: . B-complex with vitamin C  1 tablet Oral Daily  . chlorhexidine gluconate (MEDLINE KIT)  15 mL Mouth Rinse BID  . Chlorhexidine Gluconate Cloth  6 each Topical Daily  . darbepoetin (ARANESP) injection - DIALYSIS  100 mcg Intravenous Q Wed-HD  . feeding supplement (PRO-STAT SUGAR FREE 64)  30 mL Per Tube Daily  . fludrocortisone  0.1 mg Oral Daily  . hydrocortisone sod succinate (SOLU-CORTEF) inj  50 mg Intravenous Q6H  . insulin aspart  0-6 Units Subcutaneous Q4H  . levothyroxine  75 mcg Per Tube Q0600  . mouth rinse  15 mL Mouth Rinse 10 times per day  . metoprolol tartrate  25 mg Per Tube BID  . midodrine  10 mg Oral TID  . pantoprazole  40 mg Oral Q1200  . sodium chloride flush  10-40 mL Intracatheter Q12H   . sodium chloride 250 mL (01/31/20 0736)  . amiodarone 30  mg/hr (02/03/20 0900)  . cefTRIAXone (ROCEPHIN)  IV Stopped (02/02/20 1514)  . feeding supplement (VITAL 1.5 CAL) 50 mL/hr (02/02/20 1712)  . phenylephrine (NEO-SYNEPHRINE) Adult infusion Stopped (02/02/20 1610)   sodium chloride, ipratropium-albuterol, lip balm, polyethylene glycol, sodium chloride flush

## 2020-02-03 NOTE — Progress Notes (Signed)
[place] Progress Note Patient Name: Sabrina Beck DOB: 23-Mar-1941 MRN: [id number]   Date of Service  02/03/2020  HPI/Events of Note  Patient is s/p CPR and has musculoskeletal chest pains.  eICU Interventions  Tylenol 1000 mg po Q 6 hours PRN pain ordered.        Sabrina Beck 02/03/2020, 9:07 PM

## 2020-02-03 NOTE — Progress Notes (Signed)
NAME:  Sabrina Beck, MRN:  [id number], DOB:  October 21, 1940, LOS: 28 ADMISSION DATE:  01/06/2020, CONSULTATION DATE: 6/14 REFERRING MD: Dr. Vanita Panda EDP, CHIEF COMPLAINT: Cardiac arrest  Brief History   79 yo female presented to [facility] on 6/14 with respiratory leading to cardiac arrest from pneumonia.    Past Medical History  A fib on coumadin, ESRD on HD M/W/F, Diastolic CHF, Diverticulosis, GERD, Headache, HLD, Neuropathy, HTN, Uterine cancer  Significant Hospital Events   6/14 admit for cardiac arrest 6/19 failed extubation attempt 6/28 extubate 6/29 off CRRT, off pressors, transfer to progressive care  Consults:  Nephrology Cardiothoracic surgery >> s/o 6/24  Procedures:  ETT 6/14 > 6/19  Lt IJ HD cath 6/14 > ETT 6/19 >> 6/28 Rt radial a line 6/23 >> 6/29  Significant Diagnostic Tests:   CT head 6/14 >> atrophy  CTA C/A/P 6/23 >> type 2 aortic dissection, small pericardial effusion, small left hemothorax  Echo 6/23 >> EF 50 to 55%, mild LVH, grade 2 DD, mild elevation in PASP, mod LA dilation  Micro Data:  SARS CoV2 6/14 >> negative MRSA PCR 6/14 >> negative Blood 6/14 >> negative Sputum 6/21 >> negative Sputum 6/23 >> Serratia Blood 6/23 >> negative  Antimicrobials:  Cefepime 6/14 > 6/21 Vancomycin 6/14 Meropenem 6/23 >> 6/24 Vancomycin 6/23 Rocephin 6/25 >>   Interim history/subjective:  Off pressors.  Still feels weak.  Objective   Blood pressure (!) 115/50, pulse 73, temperature 97.9 F (36.6 C), temperature source Axillary, resp. rate 14, height 5\' 4"  (1.626 m), weight 89.1 kg, SpO2 99 %.    FiO2 (%):  [36 %] 36 %   Intake/Output Summary (Last 24 hours) at 02/03/2020 0956 Last data filed at 02/03/2020 0900 Gross per 24 hour  Intake 1691.57 ml  Output 1278 ml  Net 413.57 ml   Filed Weights   02/01/20 0500 02/02/20 0500 02/03/20 0400  Weight: 89.6 kg 88.3 kg 89.1 kg    Examination:  General - alert Eyes - pupils reactive ENT - no sinus  tenderness, no stridor Cardiac - regular rate/rhythm, no murmur Chest - scattered rhonchi Abdomen - soft, non tender, + bowel sounds Extremities - 1+ edema Skin - no rashes Neuro - follows commands, moves extremities   Resolved Problems:  Respiratory failure leading to PEA cardiac arrest, Septic shock from HCAP  Assessment & Plan:   Acute hypoxic respiratory failure from Rt lower lobe CAP complicated by Lt lower lobe HCAP with Serratia. - goal SpO2 > 92% - f/u CXR intermittently - day 7/10 of Abx, currently on rocephin - prn BDs  Relative adrenal insufficiency. - wean off solucortef  - continue florinef for now  ESRD. - nephrology planning iHD on 7/01 - continue midodrine  Type B aortic dissection. - likely from trauma in setting of CPR - gao SBP < 120, HR < 70  A fib with RVR with hx of chronic A fib present on admission. Chronic diastolic CHF. - complete current infusion of amiodarone and not renew - continue lopressor - resume coumadin per pharmacy 6/29  Anemia of chronic disease and critical illness. 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 - SSI - hold outpt neurontin for now  Hx of hypothyroidism. - continue synthroid  Dysphagia. - continue tube feeds through cortrak - f/u with speech therapy  Deconditioning. - PT/OT  Goals of care. -  d/w pt on 6/28 - she would be okay with reintubation if needed - no CPR, no defibrillation  Best practice:  Diet: tube feeds DVT prophylaxis: coumadin GI prophylaxis: protonix Mobility: BR Code Status: limited resuscitation Disposition: progressive care  Transfer to [facility] 6/30 and PCCM off.  Labs:   CMP Latest Ref Rng & Units 02/03/2020 02/02/2020 02/02/2020  Glucose 70 - 99 mg/dL 181(H) 137(H) 176(H)  BUN 8 - 23 mg/dL 30(H) 27(H) 33(H)  Creatinine 0.44 - 1.00 mg/dL 1.45(H)  1.34(H) 1.58(H)  Sodium 135 - 145 mmol/L 137 138 137  Potassium 3.5 - 5.1 mmol/L 3.8 4.3 4.5  Chloride 98 - 111 mmol/L 102 102 101  CO2 22 - 32 mmol/L 22 25 23   Calcium 8.9 - 10.3 mg/dL 10.2 10.4(H) 10.1  Total Protein 6.5 - 8.1 g/dL - - -  Total Bilirubin 0.3 - 1.2 mg/dL - - -  Alkaline Phos 38 - 126 U/L - - -  AST 15 - 41 U/L - - -  ALT 0 - 44 U/L - - -    CBC Latest Ref Rng & Units 02/03/2020 02/02/2020 02/01/2020  WBC 4.0 - 10.5 K/uL 13.7(H) 18.3(H) 24.1(H)  Hemoglobin 12.0 - 15.0 g/dL 7.8(L) 8.0(L) 8.7(L)  Hematocrit 36 - 46 % 24.7(L) 25.8(L) 27.9(L)  Platelets 150 - 400 K/uL 102(L) 142(L) 153    ABG    Component Value Date/Time   PHART 7.326 (L) 02/01/2020 0836   PCO2ART 40.8 01/28/2020 0836   PO2ART 83 02/04/2020 0836   HCO3 21.3 01/13/2020 0836   TCO2 22 01/22/2020 0836   ACIDBASEDEF 4.0 (H) 01/23/2020 0836   O2SAT 95.0 01/25/2020 0836    CBG (last 3)  Recent Labs    02/02/20 2352 02/03/20 0411 02/03/20 0802  GLUCAP 143* 166* 164*    Signature:  Chesley Mires, MD [place] Pager - [phone] 02/03/2020, 9:56 AM

## 2020-02-03 NOTE — Evaluation (Signed)
Occupational Therapy Evaluation Patient Details Name: Sabrina Beck MRN: [id number] DOB: [birth year] Today's Date: 02/03/2020    History of Present Illness 79 year old female with ESRD on HD presented to [facility] emergency department 6/14 with respiratory distress and suffered cardiac arrest immediately upon arrival.  Brief arrest and responsive after.  Intubated during arrest and transferred to [facility] ED for ICU admission.  Pt was intubated from 6/14-6/19 and then re-intubated from 6/19-6/28.   Clinical Impression   Pt admitted due to above. Pt currently reporting she is at home, pt following simple one step commands <50% of the time. Due to cognitive limitations, pt unable to provide home information and prior level functioning with accuracy. Per chart, pt was independent in February 2021 and modified independent with functional mobility at spc level. Pt currently with DoE 3/4 while in supported upright position in bed. Limited session to bed level BLE and BUE exercises and ADL. Pt required hand over hand assistance to wash her face and brush her hair. Following activities, pt affirming that she was tired from events of session. Pt with eyes shut majority of session. Currently recommend SNF level therapies to maximize independence and safety with ADL/IADL and functional mobility. Will continue to follow acutely.   SpO2 >97% 4lnc throughout session.     Follow Up Recommendations  SNF;Supervision/Assistance - 24 hour    Equipment Recommendations  Other (comment) (defer to next venue)    Recommendations for Other Services       Precautions / Restrictions Precautions Precautions: Fall Restrictions Weight Bearing Restrictions: No      Mobility Bed Mobility               General bed mobility comments: deferred  Transfers                 General transfer comment: deferred    Balance                                           ADL either  performed or assessed with clinical judgement   ADL Overall ADL's : Needs assistance/impaired                                       General ADL Comments: pt required totalA for all ADL;she required hand over hand assistance to wash face and brush hair;pt DoE 3/4 upon arrival, SpO2 >97% on 4lnc;pt with decreased activity tolerance after bed level exercises and ADL pt reported increased fatigue     Vision   Additional Comments: continue to assess     Perception     Praxis      Pertinent Vitals/Pain Pain Assessment: No/denies pain     Hand Dominance Left (pt initiated use of LUE for ADL)   Extremity/Trunk Assessment Upper Extremity Assessment Upper Extremity Assessment: RUE deficits/detail;LUE deficits/detail RUE Deficits / Details: grossly 2/5;limited functional use, pt required support at elbow and hand over hand assistance for coordinated movements for pt to brush hair RUE Coordination: decreased fine motor;decreased gross motor LUE Deficits / Details: grossly 2/5;decreased functional use, pt required hand over hand assistance to wash face and brush hair LUE Coordination: decreased fine motor;decreased gross motor   Lower Extremity Assessment Lower Extremity Assessment: Defer to PT evaluation;RLE deficits/detail;LLE deficits/detail RLE Deficits / Details: knee flexion  2-/5;knee extension 3/5;hip flexion 1/5 LLE Deficits / Details: knee flexion 2-/5;knee extension 3/5;hip flexion 1/5       Communication Communication Communication: Expressive difficulties (pt wispering during session)   Cognition Arousal/Alertness: Lethargic Behavior During Therapy: Flat affect Overall Cognitive Status: Difficult to assess Area of Impairment: Orientation;Following commands                 Orientation Level: Disoriented to;Place;Situation     Following Commands: Follows one step commands with increased time;Follows one step commands inconsistently        General Comments: pt following simple one step commands <50% of the time;pt very soft spoken during session;eyes shut majority of session, opened eye when prompted   General Comments  HR 80s throughout session, RR 20-23, SpO2 >97% 4lnc throughout session    Exercises Exercises: General Upper Extremity;General Lower Extremity General Exercises - Upper Extremity Elbow Flexion: Both;10 reps;Strengthening;AROM;Seated Elbow Extension: Both;10 reps;Strengthening;AROM;Seated Digit Composite Flexion: Both;10 reps;Strengthening;AROM;Seated Composite Extension: Both;10 reps;Strengthening;AROM;Seated General Exercises - Lower Extremity Heel Slides: Both;10 reps;Strengthening;AROM;Seated Straight Leg Raises: Both;10 reps;Strengthening;AROM;Seated Toe Raises: Both;10 reps;AROM;Seated   Shoulder Instructions      Home Living Family/patient expects to be discharged to:: Private residence Living Arrangements: Children Available Help at Discharge: Available 24 hours/day Type of Home: House Home Access: Stairs to enter Technical brewer of Steps: 1 Entrance Stairs-Rails: None Home Layout: One level     Bathroom Shower/Tub: Door;Walk-in Psychologist, prison and probation services: Handicapped height Bathroom Accessibility: Yes   Home Equipment: Shower seat - built in;Walker - 2 wheels;Cane - quad;Hand held shower head;Grab bars - tub/shower   Additional Comments: information above per chart from pt's previous admission in February 2021. Pt was unable to provide information this session.       Prior Functioning/Environment Level of Independence: Independent with assistive device(s)        Comments: Per chart from admission in February, pt was independent with ADL, son assisted with cooking, pt was using [facility] for functional mobility         OT Problem List: Decreased strength;Decreased range of motion;Decreased activity tolerance;Impaired balance (sitting and/or standing);Decreased safety  awareness;Decreased cognition;Cardiopulmonary status limiting activity;Impaired UE functional use      OT Treatment/Interventions: Self-care/ADL training;Therapeutic exercise;Energy conservation;DME and/or AE instruction;Therapeutic activities;Cognitive remediation/compensation;Patient/family education;Balance training    OT Goals(Current goals can be found in the care plan section) Acute Rehab OT Goals Patient Stated Goal: pt did not state OT Goal Formulation: With patient Time For Goal Achievement: 02/17/20 Potential to Achieve Goals: Fair ADL Goals Pt Will Perform Eating: with set-up;sitting Pt Will Perform Grooming: with set-up;sitting Additional ADL Goal #1: Pt will follow multistep commands consistently during ADL and mobility. Additional ADL Goal #2: Pt will demonstrate selective attention for 3-5 minutes during ADL completion. Additional ADL Goal #3: Pt will tolerate sitting EOB for 5-10 minutes with minimal assistance during ADL completion.  OT Frequency: Min 2X/week   Barriers to D/C: Other (comment)  unsure pt's level of support at home       Co-evaluation              AM-PAC OT "6 Clicks" Daily Activity     Outcome Measure Help from another person eating meals?: Total Help from another person taking care of personal grooming?: Total Help from another person toileting, which includes using toliet, bedpan, or urinal?: Total Help from another person bathing (including washing, rinsing, drying)?: Total Help from another person to put on and taking off regular upper body clothing?: Total  Help from another person to put on and taking off regular lower body clothing?: Total 6 Click Score: 6   End of Session Equipment Utilized During Treatment: Oxygen Nurse Communication: Mobility status  Activity Tolerance: Patient tolerated treatment well;Patient limited by fatigue Patient left: in bed;with call bell/phone within reach;with bed alarm set  OT Visit Diagnosis:  Unsteadiness on feet (R26.81);Other abnormalities of gait and mobility (R26.89);Muscle weakness (generalized) (M62.81);Other symptoms and signs involving cognitive function                Time: 1414-1443 OT Time Calculation (min): 29 min Charges:  OT General Charges $OT Visit: 1 Visit OT Evaluation $OT Eval Moderate Complexity: 1 Mod OT Treatments $Self Care/Home Management : 8-22 mins  Helene Kelp OTR/L Acute Rehabilitation Services Office: [place] 02/03/2020, 3:36 PM

## 2020-02-03 NOTE — Progress Notes (Signed)
  Speech Language Pathology Treatment: Dysphagia  Patient Details Name: Sabrina Beck MRN: [id number] DOB: [birth year] Today's Date: 02/03/2020 Time: 8882-8003 SLP Time Calculation (min) (ACUTE ONLY): 12 min  Assessment / Plan / Recommendation Clinical Impression  Pt continues to present with signs of dysphagia resulting from prolonged intubation. Today she is responsive but lethargic and easily fatigues. Needs max cues to masticate ice or orally transit honey thick water to swallow. Pt grimaces with swallow and has immediate impaired cough, more productive when reflexive than volitional; has sternal pain. Pt was able to take three teaspoons of honey thick water with cough productive of thick sputum. Suspect standing pharyngeal secretions due to prolonged NPO status. Recommend pt continue with cortrak, but be given teaspoons of honey thick water occasionally (max 4 oz a shift) to begin utilizing swallow mechanism and hydrating the pharynx. Hopeful pt will tolerate more activity tomorrow, FEES would be beneficial when pt shows ability to tolerate increased trials.   HPI HPI: 78 year old female with ESRD on HD presented to [facility] emergency department 6/14 with respiratory distress and suffered cardiac arrest immediately upon arrival.  Brief arrest and responsive after.  Intubated during arrest and transferred to [facility] ED for ICU admission.  Pt was intubated from 6/14-6/19 and then re-intubated from 6/19-6/28.       SLP Plan  Continue with current plan of care       Recommendations  Diet recommendations: Other(comment) (occasional teaspoons of honey thick water ok) Liquids provided via: Teaspoon Supervision: Full supervision/cueing for compensatory strategies Compensations: Slow rate;Small sips/bites Postural Changes and/or Swallow Maneuvers: Seated upright 90 degrees                Oral Care Recommendations: Oral care QID Follow up Recommendations: Skilled Nursing facility SLP  Visit Diagnosis: Dysphagia, unspecified (R13.10) Plan: Continue with current plan of care       GO               Herbie Baltimore, MA [place] Pager [phone] Office [phone]  Lynann Beaver 02/03/2020, 11:07 AM

## 2020-02-03 NOTE — Progress Notes (Signed)
Patient's OP HD clinic/[facility] requests update. 02/03/20 progress notes faxed to clinic to provide ongoing continuity of care.  Alphonzo Cruise, [place] Renal Navigator [phone]

## 2020-02-04 DIAGNOSIS — N186 End stage renal disease: Secondary | ICD-10-CM | POA: Diagnosis not present

## 2020-02-04 DIAGNOSIS — Z992 Dependence on renal dialysis: Secondary | ICD-10-CM | POA: Diagnosis not present

## 2020-02-04 LAB — CBC WITH DIFFERENTIAL/PLATELET
Abs Immature Granulocytes: 0 10*3/uL (ref 0.00–0.07)
Basophils Absolute: 0 10*3/uL (ref 0.0–0.1)
Basophils Relative: 0 %
Eosinophils Absolute: 0 10*3/uL (ref 0.0–0.5)
Eosinophils Relative: 0 %
HCT: 26.4 % — ABNORMAL LOW (ref 36.0–46.0)
Hemoglobin: 8.1 g/dL — ABNORMAL LOW (ref 12.0–15.0)
Lymphocytes Relative: 9 %
Lymphs Abs: 1.5 10*3/uL (ref 0.7–4.0)
MCH: 35.4 pg — ABNORMAL HIGH (ref 26.0–34.0)
MCHC: 30.7 g/dL (ref 30.0–36.0)
MCV: 115.3 fL — ABNORMAL HIGH (ref 80.0–100.0)
Monocytes Absolute: 1.4 10*3/uL — ABNORMAL HIGH (ref 0.1–1.0)
Monocytes Relative: 8 %
Neutro Abs: 14.1 10*3/uL — ABNORMAL HIGH (ref 1.7–7.7)
Neutrophils Relative %: 83 %
Platelets: 143 10*3/uL — ABNORMAL LOW (ref 150–400)
RBC: 2.29 MIL/uL — ABNORMAL LOW (ref 3.87–5.11)
RDW: 22.5 % — ABNORMAL HIGH (ref 11.5–15.5)
WBC: 17 10*3/uL — ABNORMAL HIGH (ref 4.0–10.5)
nRBC: 0 /100 WBC
nRBC: 0.2 % (ref 0.0–0.2)

## 2020-02-04 LAB — RENAL FUNCTION PANEL
Albumin: 2.4 g/dL — ABNORMAL LOW (ref 3.5–5.0)
Anion gap: 14 (ref 5–15)
BUN: 56 mg/dL — ABNORMAL HIGH (ref 8–23)
CO2: 23 mmol/L (ref 22–32)
Calcium: 11 mg/dL — ABNORMAL HIGH (ref 8.9–10.3)
Chloride: 102 mmol/L (ref 98–111)
Creatinine, Ser: 3.13 mg/dL — ABNORMAL HIGH (ref 0.44–1.00)
GFR calc Af Amer: 16 mL/min — ABNORMAL LOW (ref >60)
GFR calc non Af Amer: 13 mL/min — ABNORMAL LOW (ref >60)
Glucose, Bld: 160 mg/dL — ABNORMAL HIGH (ref 70–99)
Phosphorus: 3.8 mg/dL (ref 2.5–4.6)
Potassium: 4.4 mmol/L (ref 3.5–5.1)
Sodium: 139 mmol/L (ref 135–145)

## 2020-02-04 LAB — PROTIME-INR
INR: 1.1 (ref 0.8–1.2)
Prothrombin Time: 14.2 seconds (ref 11.4–15.2)

## 2020-02-04 LAB — GLUCOSE, CAPILLARY
Glucose-Capillary: 162 mg/dL — ABNORMAL HIGH (ref 70–99)
Glucose-Capillary: 162 mg/dL — ABNORMAL HIGH (ref 70–99)
Glucose-Capillary: 144 mg/dL — ABNORMAL HIGH (ref 70–99)
Glucose-Capillary: 167 mg/dL — ABNORMAL HIGH (ref 70–99)
Glucose-Capillary: 130 mg/dL — ABNORMAL HIGH (ref 70–99)
Glucose-Capillary: 176 mg/dL — ABNORMAL HIGH (ref 70–99)

## 2020-02-04 MED ORDER — WARFARIN SODIUM 5 MG PO TABS
10.0000 mg | ORAL_TABLET | Freq: Once | ORAL | Status: AC
  Administered 2020-02-04: 10 mg
  Filled 2020-02-04: qty 2

## 2020-02-04 MED ORDER — CHLORHEXIDINE GLUCONATE CLOTH 2 % EX PADS: 6.0000 | MEDICATED_PAD | Freq: Every day | CUTANEOUS | Status: DC

## 2020-02-04 NOTE — Progress Notes (Signed)
  Speech Language Pathology Treatment: Dysphagia  Patient Details Name: Sabrina Beck MRN: [id number] DOB: 1941/04/05 Today's Date: 02/04/2020 Time: 2025-4270 SLP Time Calculation (min) (ACUTE ONLY): 22 min  Assessment / Plan / Recommendation Clinical Impression  Pt demonstrates improved attention and initiation today, though there is still concern for secretion management and force of cough is very impaired. By the end of session with some cueing on vocal fold adduction, pt did have a few audibly effectively coughs. Time to attempt instrumental assessment to determine if pt can toelrate advanced Po trials. Suspect she will still need supplemental nutrition. Sons in agreement, plan for FEES tomorrow at 1130, consent in chart.   HPI HPI: 79 year old female with ESRD on HD presented to [facility] emergency department 6/14 with respiratory distress and suffered cardiac arrest immediately upon arrival.  Brief arrest and responsive after.  Intubated during arrest and transferred to [facility] ED for ICU admission.  Pt was intubated from 6/14-6/19 and then re-intubated from 6/19-6/28.       SLP Plan  Other (Comment) (FEES)       Recommendations  Diet recommendations: NPO                Oral Care Recommendations: Oral care QID Follow up Recommendations: Skilled Nursing facility SLP Visit Diagnosis: Dysphagia, unspecified (R13.10) Plan: Other (Comment) (FEES)       GO               Herbie Baltimore, MA CCC-SLP  Acute Rehabilitation Services Pager [phone] Office [phone]  Lynann Beaver 02/04/2020, 12:18 PM

## 2020-02-04 NOTE — Evaluation (Signed)
Physical Therapy Evaluation Patient Details Name: Sabrina Beck MRN: [id number] DOB: 1940/11/03 Today's Date: 02/04/2020   History of Present Illness  79 year old female with ESRD on HD presented to [facility] emergency department 6/14 with respiratory distress and suffered cardiac arrest immediately upon arrival.  Brief arrest and responsive after.  Intubated during arrest and transferred to [facility] ED for ICU admission.  Pt was intubated from 6/14-6/19 and then re-intubated from 6/19-6/28.  Clinical Impression  Pt admitted with/for cardiac arrest and respiratory distress.  Pt needing moderate assist at least for basic mobility and is quick to fatigue out..  Pt currently limited functionally due to the problems listed. ( See problems list.)   Pt will benefit from PT to maximize function and safety in order to get ready for next venue listed below.     Follow Up Recommendations SNF;Other (comment) (but working toward home.)    Equipment Recommendations  Other (comment) (TBA)    Recommendations for Other Services       Precautions / Restrictions Precautions Precautions: Fall      Mobility  Bed Mobility Overal bed mobility: Needs Assistance Bed Mobility: Supine to Sit     Supine to sit: Mod assist     General bed mobility comments: assisted pt moderately to bridge several times to EOB and assist trunk to come up and forward.  Transfers Overall transfer level: Needs assistance Equipment used: None Transfers: Sit to/from [facility] Sit to Stand: Mod assist Stand pivot transfers: Mod assist;Max assist       General transfer comment: cues for hand placement and pt needed extra assist to come forward with boost.  Maximal support during minimal pivotal steps to the chair.  Ambulation/Gait                Stairs            Wheelchair Mobility    Modified Rankin (Stroke Patients Only)       Balance Overall balance assessment: Needs  assistance Sitting-balance support: Feet supported;Single extremity supported;Bilateral upper extremity supported Sitting balance-Leahy Scale: Poor Sitting balance - Comments: reliant on external support                                     Pertinent Vitals/Pain Pain Assessment: Faces Faces Pain Scale: Hurts a little bit Pain Location: chest discomfort Pain Descriptors / Indicators: Discomfort;Grimacing Pain Intervention(s): Monitored during session    Home Living Family/patient expects to be discharged to:: Private residence Living Arrangements: Children Available Help at Discharge: Available 24 hours/day Type of Home: House Home Access: Stairs to enter Entrance Stairs-Rails: None Entrance Stairs-Number of Steps: 1 Home Layout: One level Home Equipment: Shower seat - built in;Walker - 2 wheels;Cane - quad;Hand held shower head;Grab bars - tub/shower Additional Comments: information above per chart from pt's previous admission in February 2021. Pt was unable to provide information this session.     Prior Function Level of Independence: Independent with assistive device(s)         Comments: Per son, pt was independent with ADL, son assisted with cooking, pt was using SPC for functional mobility      Hand Dominance   Dominant Hand: Left (pt initiated use of LUE for ADL)    Extremity/Trunk Assessment        Lower Extremity Assessment Lower Extremity Assessment: RLE deficits/detail;LLE deficits/detail RLE Deficits / Details: general weakness except gross  extension 3+ RLE Coordination: decreased fine motor LLE Deficits / Details: General weakness overall with gross extension. LLE Coordination: decreased fine motor       Communication   Communication: Expressive difficulties (pt still unable to phonate loudly, wispering during session)  Cognition Arousal/Alertness: Lethargic Behavior During Therapy: Flat affect Overall Cognitive Status:  Impaired/Different from baseline Area of Impairment: Orientation;Following commands;Attention;Awareness                 Orientation Level: Disoriented to;Situation;Time;Place Current Attention Level: Focused;Sustained   Following Commands: Follows one step commands with increased time;Follows one step commands inconsistently   Awareness: Intellectual          General Comments      Exercises Other Exercises Other Exercises: warm up LE ROM exercise, bicep/tricep pressess.   Assessment/Plan    PT Assessment Patient needs continued PT services  PT Problem List Decreased strength;Decreased activity tolerance;Decreased balance;Decreased mobility;Decreased knowledge of use of DME;Cardiopulmonary status limiting activity;Pain       PT Treatment Interventions Gait training;Functional mobility training;Therapeutic activities;Balance training;Patient/family education;DME instruction    PT Goals (Current goals can be found in the Care Plan section)  Acute Rehab PT Goals Patient Stated Goal: pt did not state PT Goal Formulation: With patient Time For Goal Achievement: 02/18/20 Potential to Achieve Goals: Good    Frequency Min 3X/week   Barriers to discharge        Co-evaluation               AM-PAC PT "6 Clicks" Mobility  Outcome Measure Help needed turning from your back to your side while in a flat bed without using bedrails?: A Lot Help needed moving from lying on your back to sitting on the side of a flat bed without using bedrails?: A Lot Help needed moving to and from a bed to a chair (including a wheelchair)?: A Lot Help needed standing up from a chair using your arms (e.g., wheelchair or bedside chair)?: A Lot Help needed to walk in hospital room?: Total Help needed climbing 3-5 steps with a railing? : Total 6 Click Score: 10    End of Session   Activity Tolerance: Patient tolerated treatment well;Patient limited by fatigue Patient left: in chair;with  call bell/phone within reach;with chair alarm set;with family/visitor present Nurse Communication: Mobility status PT Visit Diagnosis: Unsteadiness on feet (R26.81);Muscle weakness (generalized) (M62.81);Difficulty in walking, not elsewhere classified (R26.2)    Time: 9373-4287 PT Time Calculation (min) (ACUTE ONLY): 38 min   Charges:   PT Evaluation $PT Eval Moderate Complexity: 1 Mod PT Treatments $Therapeutic Activity: 23-37 mins        02/04/2020  Sabrina Carne., PT Acute Rehabilitation Services [phone]  (pager) [phone]  (office)  Sabrina Beck 02/04/2020, 6:08 PM

## 2020-02-04 NOTE — Progress Notes (Signed)
[facility] Progress Note  Subjective: remains off pressors, off CRRT and off vent. NG tube feeds.   Vitals:   02/04/20 1000 02/04/20 1100 02/04/20 1200 02/04/20 1300  BP: 127/84 105/67 (!) 123/52 111/68  Pulse: 78 79 78 85  Resp: (!) 25 (!) 24 (!) 25 (!) 27  Temp:   97.8 F (36.6 C)   TempSrc:   Oral   SpO2: 96% 95% 98% 94%  Weight:      Height:        Exam: Gen extubated, up in chair, very weak, NG feeding tube in  No jvd or bruits Chest occ rhonchi, improved RRR no MRG Abd soft ntnd no mass or ascites +bs Ext no pitting edema x 4 Neuro responsive LFA AVF+bruit   CXR 6/29 - basilar atx, no edema or pvc   OP HD: [facility] MWF  3.5h  2/2.5 bath  97.5kg  No heparin  LFA AVF  - EPO 6600 q Rx   Assessment/ Plan: 1. Cardiac arrest - resuscitated in ED 2. Sepsis/ shock/ PNA - getting IV abx, off pressors and extubated. On midodrine 10 tid 3. Type B aortic dissection - felt likely due to CPR, seen by TCTS and plan is for medical management 4. Acute resp failure - extubated, on room air this am 5. ESRD - on HD MWF.  CRRT started on 6/15 - 6.29. Plan next HD Thursday  6. BP/ volume -  successful UF last week and down 7kg under prior dry wt  7. MBD ckd -  Ca up slightly. P repleted 6/22 8. Anemia ckd - Hb 9.1 today, transfuse prn.  darbe 100ug  weekly while here. Tsat 89%, no need for IV Fe. 9. Atrial fibrillation - getting metoprolol 25 bid for rate control 10. Hypercalcemia-  Developed in house-  Maybe due to being immobility, no need Rx yet 11. Partial DNR - PCT working w/ family, is now NO code blue/ cpr/ shock/ meds, YES intubation/ bipap   Kelly Splinter, MD 02/04/2020, 2:09 PM       Recent Labs  Lab 02/03/20 0409 02/04/20 0504  K 3.8 4.4  BUN 30* 56*  CREATININE 1.45* 3.13*  CALCIUM 10.2 11.0*  PHOS 2.5 3.8  HGB 7.8* 8.1*   Inpatient medications: . B-complex with vitamin C  1 tablet Per Tube Daily  . Chlorhexidine Gluconate  Cloth  6 each Topical Daily  . darbepoetin (ARANESP) injection - DIALYSIS  100 mcg Intravenous Q Wed-HD  . feeding supplement (PRO-STAT SUGAR FREE 64)  30 mL Per Tube Daily  . fludrocortisone  0.1 mg Per Tube Daily  . hydrocortisone sod succinate (SOLU-CORTEF) inj  50 mg Intravenous Q8H  . insulin aspart  0-6 Units Subcutaneous Q4H  . levothyroxine  75 mcg Per Tube Q0600  . mouth rinse  15 mL Mouth Rinse BID  . metoprolol tartrate  25 mg Per Tube BID  . midodrine  10 mg Per Tube TID  . pantoprazole sodium  40 mg Per Tube Daily  . sodium chloride flush  10-40 mL Intracatheter Q12H  . warfarin  10 mg Per Tube ONCE-1600  . Warfarin - Pharmacist Dosing Inpatient   Does not apply q1600   . sodium chloride Stopped (02/04/20 1059)  . amiodarone 30 mg/hr (02/04/20 1248)  . feeding supplement (VITAL 1.5 CAL) 50 mL/hr (02/04/20 1316)   sodium chloride, acetaminophen (TYLENOL) oral liquid 160 mg/5 mL, heparin, ipratropium-albuterol, lip balm, polyethylene glycol, sodium chloride flush

## 2020-02-04 NOTE — Progress Notes (Signed)
[facility] for warfarin >> Heparin Indication: atrial fibrillation  No Known Allergies  Patient Measurements: Height: 5\' 4"  (162.6 cm) Weight: 93.3 kg (205 lb 11 oz) IBW/kg (Calculated) : 54.7 Heparin Dosing Weight: 75.8 kg  Vital Signs: Temp: 98.8 F (37.1 C) (06/22 1121) Temp Source: Axillary (06/22 1121) BP: 83/58 (06/22 1215) Pulse Rate: 111 (06/22 1215)  Labs: Recent Labs    01/25/20 0231 01/25/20 1549 01/26/20 0331 01/26/20 1633 01/27/20 0308  HGB 7.8*  --  7.8*  --  8.1*  HCT 24.1*  --  25.1*  --  26.1*  PLT 142*  --  136*  --  126*  APTT 32  --  27  --  33  LABPROT 15.0  --  14.8  --  16.3*  INR 1.2  --  1.2  --  1.4*  CREATININE 1.61*   < > 1.66* 1.75* 1.74*   < > = values in this interval not displayed.    Estimated Creatinine Clearance: 28.5 mL/min (A) (by C-G formula based on SCr of 1.74 mg/dL (H)).   Assessment: 82 yof on warfarin PTA for history of afib admitted with respiratory distress with cardiac arrest on arrival to ER with ROSC. Pt switched to heparin in case any procedures needed.  Resumed warfarin 6/29  INR 1.1 today  Goal of Therapy:  INR 2-3 Monitor platelets by anticoagulation protocol: Yes   Plan:  Warfarin 10 mg x 1 Daily INR  Barth Kirks, PharmD, BCPS, BCCCP Clinical Pharmacist [phone]  Please check AMION for all [place] numbers  02/04/2020 9:26 AM

## 2020-02-04 NOTE — Progress Notes (Addendum)
PROGRESS NOTE    Sabrina Beck  BUL:[id number] DOB: [birth year] DOA: 01/28/2020 PCP: Redmond School, MD   Brief Narrative:  79 yo female with past medical history of A. fib on Coumadin, ESRD on HD MWF, diastolic CHF, diverticulitis, GERD, HLD, neuropathy and hypertension and uterine cancer presented to [facility] on 6/14 with respiratory leading to cardiac arrest from pneumonia.    Patient failed extubation attempt on 01/24/2020 requiring reintubation and subsequently was successfully extubated on 01/25/2020.  CT head negative for acute pathology.  CT angiogram chest, abdomen and pelvis on 01/28/2020 showed type II aortic dissection, small pericardial effusion and small left hemothorax.  Seen by cardiothoracic surgery who recommended medical management.  Echo on 01/28/2020 showed ejection fraction 50 to 55%, mild LVH, grade 2 DD, moderate LA dilatation.  Patient was initially started on cefepime and vancomycin which were switched to Merrem and vancomycin on 01/28/2020 and then switched to Rocephin 01/30/2020.  Assessment & Plan:   Active Problems:   ESRD (end stage renal disease) on dialysis ([place])   Acute respiratory failure with hypoxia and hypercapnia (HCC)   Septic shock (HCC)   Cardiac arrest (HCC)   Acute hypoxic respiratory failure from Rt lower lobe CAP complicated by Lt lower lobe HCAP with Serratia/septic shock: Now saturating well over 92% on room air.  Continue Rocephin for total 10 days.  Relative adrenal insufficiency: Continue Florinef.  Blood pressure stable.  ESRD: Received CRRT while intubated.  Nephrology on board.  Now plan to resume HD.  Type B aortic dissection. - likely from trauma in setting of CPR - gao SBP < 120, HR < 70.  A fib with RVR with hx of chronic A fib present on admission/chronic diastolic CHF. On amiodarone currently.  Resumed Coumadin on 02/03/2020.  Dosed by pharmacy.  Thrombocytopenia and anemia of chronic disease and critical illness: Hemoglobin and  platelets are stable.  DM type 2: Controlled.  Continue SSI.  Hx of hypothyroidism. - continue synthroid  Dysphagia. - continue tube feeds through cortrak - f/u with speech therapy.  Deconditioning. - PT/OT  Goals of care: After discussing with 2 sons at the bedside, consulted palliative care.  DVT prophylaxis:    Code Status: Partial Code  Family Communication: 2 sons present at bedside and plan of care discussed with them.  Status is: Inpatient  Remains inpatient appropriate because:Inpatient level of care appropriate due to severity of illness   Dispo: The patient is from: Home              Anticipated d/c is to: SNF              Anticipated d/c date is: 3 days              Patient currently is not medically stable to d/c.        Estimated body mass index is 34.1 kg/m as calculated from the following:   Height as of this encounter: 5\' 4"  (1.626 m).   Weight as of this encounter: 90.1 kg.  Pressure Injury 02/04/20 Left skin tear on left cheek from ETT holder. (Active)  02/04/20 0800  Location:   Location Orientation: Left  Staging:   Wound Description (Comments): skin tear on left cheek from ETT holder.  Present on Admission:      Nutritional status:  Nutrition Problem: Inadequate oral intake Etiology: inability to eat   Signs/Symptoms: NPO status   Interventions: Refer to RD note for recommendations    Consultants:   Cardiothoracic  surgery  Procedures:   Intubation  Antimicrobials:  Anti-infectives (From admission, onward)   Start     Dose/Rate Route Frequency Ordered Stop   01/30/20 1345  cefTRIAXone (ROCEPHIN) 2 g in sodium chloride 0.9 % 100 mL IVPB        2 g 200 mL/hr over 30 Minutes Intravenous Every 24 hours 01/30/20 1245 02/03/20 1327   01/29/20 1500  vancomycin (VANCOCIN) IVPB 1000 mg/200 mL premix  Status:  Discontinued        1,000 mg 200 mL/hr over 60 Minutes Intravenous Every 24 hours 01/28/20 1000 01/29/20 1156    01/28/20 1100  vancomycin (VANCOREADY) IVPB 2000 mg/400 mL        2,000 mg 200 mL/hr over 120 Minutes Intravenous  Once 01/28/20 1000 01/28/20 1648   01/28/20 1100  meropenem (MERREM) 1 g in sodium chloride 0.9 % 100 mL IVPB  Status:  Discontinued        1 g 200 mL/hr over 30 Minutes Intravenous Every 8 hours 01/28/20 1000 01/30/20 1245   01/21/20 1800  ceFEPIme (MAXIPIME) 2 g in sodium chloride 0.9 % 100 mL IVPB        2 g 200 mL/hr over 30 Minutes Intravenous Every 12 hours 01/21/20 0803 01/27/20 0541   01/21/20 1200  vancomycin (VANCOCIN) IVPB 1000 mg/200 mL premix  Status:  Discontinued        1,000 mg 200 mL/hr over 60 Minutes Intravenous Every M-W-F (Hemodialysis) 01/29/2020 0904 01/20/20 0902   01/20/20 1800  ceFEPIme (MAXIPIME) 2 g in sodium chloride 0.9 % 100 mL IVPB  Status:  Discontinued        2 g 200 mL/hr over 30 Minutes Intravenous Every 12 hours 01/20/20 0941 01/21/20 0803   01/20/20 0600  ceFEPIme (MAXIPIME) 1 g in sodium chloride 0.9 % 100 mL IVPB  Status:  Discontinued        1 g 200 mL/hr over 30 Minutes Intravenous Every 24 hours 01/28/2020 0903 01/20/20 0941   01/07/2020 0545  ceFEPIme (MAXIPIME) 2 g in sodium chloride 0.9 % 100 mL IVPB        2 g 200 mL/hr over 30 Minutes Intravenous  Once 01/28/2020 0537 01/13/2020 0712   01/06/2020 0545  vancomycin (VANCOREADY) IVPB 2000 mg/400 mL        2,000 mg 200 mL/hr over 120 Minutes Intravenous  Once 01/10/2020 0541 01/16/2020 0802         Subjective: Patient seen and examined.  2 sons present at bedside.  Patient was starting to work with physical therapy.  She was slightly tachypneic but denied any shortness of breath.  She was saturating well over 92% on room air.  Objective: Vitals:   02/04/20 1100 02/04/20 1200 02/04/20 1300 02/04/20 1400  BP: 105/67 (!) 123/52 111/68 (!) 119/51  Pulse: 79 78 85 75  Resp: (!) 24 (!) 25 (!) 27 (!) 23  Temp:  97.8 F (36.6 C)    TempSrc:  Oral    SpO2: 95% 98% 94% 97%  Weight:        Height:        Intake/Output Summary (Last 24 hours) at 02/04/2020 1504 Last data filed at 02/04/2020 1200 Gross per 24 hour  Intake 1659.71 ml  Output --  Net 1659.71 ml   Filed Weights   02/02/20 0500 02/03/20 0400 02/04/20 0500  Weight: 88.3 kg 89.1 kg 90.1 kg    Examination:  General exam: Appears tachypneic Respiratory system: Diminished breath sounds. Respiratory effort  normal. Cardiovascular system: S1 & S2 heard, irregularly irregular rate and rhythm. No JVD, murmurs, rubs, gallops or clicks.  Trace pitting edema bilateral lower extremity Gastrointestinal system: Abdomen is nondistended, soft and nontender. No organomegaly or masses felt. Normal bowel sounds heard. Central nervous system: Alert but was unable to assess orientation due to tachypnea.  No focal deficit. Extremities: Symmetric 5 x 5 power. Skin: No rashes, lesions or ulcer  Data Reviewed: I have personally reviewed following labs and imaging studies  CBC: Recent Labs  Lab 01/31/20 0410 02/01/20 0354 02/02/20 0415 02/03/20 0409 02/04/20 0504  WBC 23.7* 24.1* 18.3* 13.7* 17.0*  NEUTROABS 17.8* 17.5* 15.6* 10.4* 14.1*  HGB 9.1* 8.7* 8.0* 7.8* 8.1*  HCT 28.7* 27.9* 25.8* 24.7* 26.4*  MCV 110.8* 113.0* 113.2* 114.4* 115.3*  PLT 147* 153 142* 102* 370*   Basic Metabolic Panel: Recent Labs  Lab 01/30/20 0314 01/30/20 1540 01/31/20 0410 01/31/20 1732 02/01/20 0354 02/01/20 0354 02/01/20 1646 02/02/20 0415 02/02/20 1700 02/03/20 0409 02/04/20 0504  NA 136   < > 135   < > 137   < > 137 137 138 137 139  K 3.8   < > 3.3*   < > 3.8   < > 4.4 4.5 4.3 3.8 4.4  CL 100   < > 99   < > 101   < > 100 101 102 102 102  CO2 24   < > 22   < > 24   < > 24 23 25 22 23   GLUCOSE 204*   < > 199*   < > 214*   < > 172* 176* 137* 181* 160*  BUN 31*   < > 32*   < > 35*   < > 32* 33* 27* 30* 56*  CREATININE 1.57*   < > 1.52*   < > 1.70*   < > 1.54* 1.58* 1.34* 1.45* 3.13*  CALCIUM 10.6*   < > 10.5*   < > 10.2   < >  10.3 10.1 10.4* 10.2 11.0*  MG 2.6*  --  2.7*  --  2.5*  --   --  2.5*  --  2.6*  --   PHOS 3.3   < > 3.3   < > 3.1   < > 3.6 3.2 3.3 2.5 3.8   < > = values in this interval not displayed.   GFR: Estimated Creatinine Clearance: 15.6 mL/min (A) (by C-G formula based on SCr of 3.13 mg/dL (H)). Liver Function Tests: Recent Labs  Lab 02/01/20 1646 02/02/20 0415 02/02/20 1700 02/03/20 0409 02/04/20 0504  ALBUMIN 2.5* 2.6* 2.6* 2.3* 2.4*   No results for input(s): LIPASE, AMYLASE in the last 168 hours. No results for input(s): AMMONIA in the last 168 hours. Coagulation Profile: Recent Labs  Lab 01/31/20 0410 02/01/20 0354 02/02/20 0415 02/03/20 0409 02/04/20 0504  INR 1.1 1.2 1.1 1.2 1.1   Cardiac Enzymes: No results for input(s): CKTOTAL, CKMB, CKMBINDEX, TROPONINI in the last 168 hours. BNP (last 3 results) No results for input(s): PROBNP in the last 8760 hours. HbA1C: No results for input(s): HGBA1C in the last 72 hours. CBG: Recent Labs  Lab 02/03/20 1943 02/03/20 2351 02/04/20 0317 02/04/20 0759 02/04/20 1225  GLUCAP 139* 180* 162* 162* 144*   Lipid Profile: No results for input(s): CHOL, HDL, LDLCALC, TRIG, CHOLHDL, LDLDIRECT in the last 72 hours. Thyroid Function Tests: No results for input(s): TSH, T4TOTAL, FREET4, T3FREE, THYROIDAB in the last 72 hours. Anemia Panel: No  results for input(s): VITAMINB12, FOLATE, FERRITIN, TIBC, IRON, RETICCTPCT in the last 72 hours. Sepsis Labs: No results for input(s): PROCALCITON, LATICACIDVEN in the last 168 hours.  Recent Results (from the past 240 hour(s))  Culture, respiratory (non-expectorated)     Status: None   Collection Time: 01/26/20 11:35 AM   Specimen: Tracheal Aspirate; Respiratory  Result Value Ref Range Status   Specimen Description TRACHEAL ASPIRATE  Final   Special Requests NONE  Final   Gram Stain   Final    RARE WBC PRESENT,BOTH PMN AND MONONUCLEAR RARE BUDDING YEAST SEEN    Culture   Final     Consistent with normal respiratory flora. Performed at [facility], [place] [address] [place], [place] [address]    Report Status 01/28/2020 FINAL  Final  Culture, blood (routine x 2)     Status: None   Collection Time: 01/28/20 11:43 AM   Specimen: BLOOD RIGHT ARM  Result Value Ref Range Status   Specimen Description BLOOD RIGHT ARM  Final   Special Requests   Final    BOTTLES DRAWN AEROBIC ONLY Blood Culture adequate volume   Culture   Final    NO GROWTH 5 DAYS Performed at [facility], [place] [address] [place], [place] [address]    Report Status 02/02/2020 FINAL  Final  Culture, blood (routine x 2)     Status: None   Collection Time: 01/28/20 11:43 AM   Specimen: BLOOD RIGHT ARM  Result Value Ref Range Status   Specimen Description BLOOD RIGHT ARM  Final   Special Requests   Final    BOTTLES DRAWN AEROBIC ONLY Blood Culture adequate volume   Culture   Final    NO GROWTH 5 DAYS Performed at [facility], [place] [address] [place], [place] [address]    Report Status 02/02/2020 FINAL  Final  Culture, respiratory (non-expectorated)     Status: None   Collection Time: 01/28/20 11:49 AM   Specimen: Tracheal Aspirate; Respiratory  Result Value Ref Range Status   Specimen Description TRACHEAL ASPIRATE  Final   Special Requests NONE  Final   Gram Stain   Final    RARE WBC PRESENT, PREDOMINANTLY PMN FEW GRAM NEGATIVE RODS Performed at [facility], [place] [address], [place], [place] [address]    Culture MODERATE SERRATIA MARCESCENS  Final   Report Status 01/30/2020 FINAL  Final   Organism ID, Bacteria SERRATIA MARCESCENS  Final      Susceptibility   Serratia marcescens - MIC*    CEFAZOLIN >=64 RESISTANT Resistant     CEFEPIME <=0.12 SENSITIVE Sensitive     CEFTAZIDIME <=1 SENSITIVE Sensitive     CEFTRIAXONE <=0.25 SENSITIVE Sensitive     CIPROFLOXACIN <=0.25 SENSITIVE Sensitive     GENTAMICIN <=1 SENSITIVE Sensitive     TRIMETH/SULFA <=20 SENSITIVE  Sensitive     * MODERATE SERRATIA MARCESCENS      Radiology Studies: DG Chest Port 1 View  Result Date: 02/03/2020 CLINICAL DATA:  Pneumonia. EXAM: PORTABLE CHEST 1 VIEW COMPARISON:  CT 01/28/2020. FINDINGS: Interim removal of endotracheal tube. Interim removal of NG tube and placement of a feeding tube, its tip is below left hemidiaphragm. Cardiomegaly. No pulmonary venous congestion. Low lung volumes with bibasilar. Left base infiltrate. Small left pleural effusion again noted. No pneumothorax. IMPRESSION: 1. Interim removal of endotracheal tube. Interim removal of NG tube and placement of feeding tube, its tip is below left hemidiaphragm. 2.  Cardiomegaly.  No pulmonary venous congestion. 3. Low lung volumes with bibasilar atelectasis. Left base infiltrate and small left pleural effusion again noted. Electronically Signed   By: Marcello Moores  Register   On: 02/03/2020 07:58    Scheduled Meds: . B-complex with vitamin C  1 tablet Per Tube Daily  . Chlorhexidine Gluconate Cloth  6 each Topical Daily  . [START ON 02/05/2020] Chlorhexidine Gluconate Cloth  6 each Topical Q0600  . darbepoetin (ARANESP) injection - DIALYSIS  100 mcg Intravenous Q Wed-HD  . feeding supplement (PRO-STAT SUGAR FREE 64)  30 mL Per Tube Daily  . fludrocortisone  0.1 mg Per Tube Daily  . hydrocortisone sod succinate (SOLU-CORTEF) inj  50 mg Intravenous Q8H  . insulin aspart  0-6 Units Subcutaneous Q4H  . levothyroxine  75 mcg Per Tube Q0600  . mouth rinse  15 mL Mouth Rinse BID  . metoprolol tartrate  25 mg Per Tube BID  . midodrine  10 mg Per Tube TID  . pantoprazole sodium  40 mg Per Tube Daily  . sodium chloride flush  10-40 mL Intracatheter Q12H  . warfarin  10 mg Per Tube ONCE-1600  . Warfarin - Pharmacist Dosing Inpatient   Does not apply q1600   Continuous Infusions: . sodium chloride Stopped (02/04/20 1059)  . amiodarone 30 mg/hr (02/04/20 1248)  . feeding supplement (VITAL 1.5 CAL) 50 mL/hr (02/04/20 1316)      LOS: 16 days   Time spent: 37 minutes   Darliss Cheney, MD [facility]  02/04/2020, 3:04 PM   To contact the attending provider between 7A-7P or the covering provider during after hours 7P-7A, please log into the web site [email or web address].

## 2020-02-05 ENCOUNTER — Encounter (HOSPITAL_COMMUNITY): Payer: Self-pay | Admitting: Pulmonary Disease

## 2020-02-05 DIAGNOSIS — Z515 Encounter for palliative care: Secondary | ICD-10-CM

## 2020-02-05 LAB — CBC WITH DIFFERENTIAL/PLATELET
Abs Immature Granulocytes: 0 10*3/uL (ref 0.00–0.07)
Basophils Absolute: 0 10*3/uL (ref 0.0–0.1)
Basophils Relative: 0 %
Eosinophils Absolute: 0 10*3/uL (ref 0.0–0.5)
Eosinophils Relative: 0 %
HCT: 26.6 % — ABNORMAL LOW (ref 36.0–46.0)
Hemoglobin: 8.2 g/dL — ABNORMAL LOW (ref 12.0–15.0)
Lymphocytes Relative: 8 %
Lymphs Abs: 1.4 10*3/uL (ref 0.7–4.0)
MCH: 35.5 pg — ABNORMAL HIGH (ref 26.0–34.0)
MCHC: 30.8 g/dL (ref 30.0–36.0)
MCV: 115.2 fL — ABNORMAL HIGH (ref 80.0–100.0)
Monocytes Absolute: 1 10*3/uL (ref 0.1–1.0)
Monocytes Relative: 6 %
Neutro Abs: 14.6 10*3/uL — ABNORMAL HIGH (ref 1.7–7.7)
Neutrophils Relative %: 86 %
Platelets: 150 10*3/uL (ref 150–400)
RBC: 2.31 MIL/uL — ABNORMAL LOW (ref 3.87–5.11)
RDW: 21.7 % — ABNORMAL HIGH (ref 11.5–15.5)
WBC: 17 10*3/uL — ABNORMAL HIGH (ref 4.0–10.5)
nRBC: 0.2 % (ref 0.0–0.2)
nRBC: 2 /100 WBC — ABNORMAL HIGH

## 2020-02-05 LAB — GLUCOSE, CAPILLARY
Glucose-Capillary: 134 mg/dL — ABNORMAL HIGH (ref 70–99)
Glucose-Capillary: 134 mg/dL — ABNORMAL HIGH (ref 70–99)
Glucose-Capillary: 155 mg/dL — ABNORMAL HIGH (ref 70–99)
Glucose-Capillary: 155 mg/dL — ABNORMAL HIGH (ref 70–99)
Glucose-Capillary: 201 mg/dL — ABNORMAL HIGH (ref 70–99)

## 2020-02-05 LAB — BASIC METABOLIC PANEL
Anion gap: 17 — ABNORMAL HIGH (ref 5–15)
BUN: 90 mg/dL — ABNORMAL HIGH (ref 8–23)
CO2: 20 mmol/L — ABNORMAL LOW (ref 22–32)
Calcium: 10.3 mg/dL (ref 8.9–10.3)
Chloride: 101 mmol/L (ref 98–111)
Creatinine, Ser: 5.06 mg/dL — ABNORMAL HIGH (ref 0.44–1.00)
GFR calc Af Amer: 9 mL/min — ABNORMAL LOW (ref >60)
GFR calc non Af Amer: 7 mL/min — ABNORMAL LOW (ref >60)
Glucose, Bld: 146 mg/dL — ABNORMAL HIGH (ref 70–99)
Potassium: 5.6 mmol/L — ABNORMAL HIGH (ref 3.5–5.1)
Sodium: 138 mmol/L (ref 135–145)

## 2020-02-05 LAB — HEPATITIS B CORE ANTIBODY, TOTAL: Hep B Core Total Ab: NONREACTIVE

## 2020-02-05 LAB — PROTIME-INR
INR: 1.2 (ref 0.8–1.2)
Prothrombin Time: 15 seconds (ref 11.4–15.2)

## 2020-02-05 LAB — HEPATITIS B SURFACE ANTIBODY,QUALITATIVE: Hep B S Ab: REACTIVE — AB

## 2020-02-05 LAB — PATHOLOGIST SMEAR REVIEW

## 2020-02-05 LAB — HEPATITIS B SURFACE ANTIGEN: Hepatitis B Surface Ag: NONREACTIVE

## 2020-02-05 MED ORDER — CHLORHEXIDINE GLUCONATE CLOTH 2 % EX PADS
6.0000 | MEDICATED_PAD | Freq: Every day | CUTANEOUS | Status: DC
  Administered 2020-02-07: 6 via TOPICAL

## 2020-02-05 MED ORDER — DARBEPOETIN ALFA 100 MCG/0.5ML IJ SOSY
PREFILLED_SYRINGE | INTRAMUSCULAR | Status: AC
  Filled 2020-02-05: qty 0.5

## 2020-02-05 MED ORDER — WARFARIN SODIUM 7.5 MG PO TABS
15.0000 mg | ORAL_TABLET | Freq: Once | ORAL | Status: AC
  Administered 2020-02-05: 15 mg
  Filled 2020-02-05: qty 2

## 2020-02-05 NOTE — Plan of Care (Signed)
  Problem: Clinical Measurements: Goal: Ability to maintain clinical measurements within normal limits will improve Outcome: Progressing   

## 2020-02-05 NOTE — Progress Notes (Signed)
PROGRESS NOTE    Sabrina Beck  VVO:[id number] DOB: 1940-09-28 DOA: 01/14/2020 PCP: Redmond School, MD   Brief Narrative:  79 yo female with past medical history of A. fib on Coumadin, ESRD on HD MWF, diastolic CHF, diverticulitis, GERD, HLD, neuropathy and hypertension and uterine cancer presented to [facility] on 6/14 with respiratory leading to cardiac arrest from pneumonia.    Patient failed extubation attempt on 01/24/2020 requiring reintubation and subsequently was successfully extubated on 01/25/2020.  CT head negative for acute pathology.  CT angiogram chest, abdomen and pelvis on 01/28/2020 showed type II aortic dissection, small pericardial effusion and small left hemothorax.  Seen by cardiothoracic surgery who recommended medical management.  Echo on 01/28/2020 showed ejection fraction 50 to 55%, mild LVH, grade 2 DD, moderate LA dilatation.  Patient was initially started on cefepime and vancomycin which were switched to Merrem and vancomycin on 01/28/2020 and then switched to Rocephin 01/30/2020.  Assessment & Plan:   Active Problems:   ESRD (end stage renal disease) on dialysis ([place])   Acute respiratory failure with hypoxia and hypercapnia (HCC)   Septic shock (HCC)   Cardiac arrest (HCC)   Acute hypoxic respiratory failure from Rt lower lobe CAP complicated by Lt lower lobe HCAP with Serratia/septic shock: Now saturating well over 92% on room air.  Continue Rocephin for total 10 days until 02/08/2020.  Relative adrenal insufficiency: Continue Florinef.  Blood pressure stable and improving.  ESRD/Hyperkalemia: Received CRRT while intubated.  Nephrology on board.  Now on IHD.  Dialysis should take care of the hyperkalemia.  Type B aortic dissection. - likely from trauma in setting of CPR - gao SBP < 120, HR < 70.  A fib with RVR with hx of chronic A fib present on admission/chronic diastolic CHF. On amiodarone currently.  Resumed Coumadin on 02/03/2020.  INR subtherapeutic.  Dosed by  pharmacy.  Thrombocytopenia and anemia of chronic disease and critical illness: Hemoglobin and platelets are stable.  DM type 2: Controlled.  Continue SSI.  Hx of hypothyroidism. - continue synthroid  Dysphagia. - continue tube feeds through cortrak - f/u with speech therapy.  Plan for F EES.  Deconditioning. - PT/OT recommends SNF.  Goals of care: After discussing with 2 sons at the bedside, consulted palliative care.  DVT prophylaxis:    Code Status: Partial Code  Family Communication: No family present.  Patient seen in dialysis unit today.  Status is: Inpatient  Remains inpatient appropriate because:Inpatient level of care appropriate due to severity of illness   Dispo: The patient is from: Home              Anticipated d/c is to: SNF              Anticipated d/c date is: 3 days              Patient currently is not medically stable to d/c.        Estimated body mass index is 33.98 kg/m as calculated from the following:   Height as of this encounter: 5\' 4"  (1.626 m).   Weight as of this encounter: 89.8 kg.  Pressure Injury 02/04/20 Left skin tear on left cheek from ETT holder. (Active)  02/04/20 0800  Location:   Location Orientation: Left  Staging:   Wound Description (Comments): skin tear on left cheek from ETT holder.  Present on Admission:      Nutritional status:  Nutrition Problem: Inadequate oral intake Etiology: inability to eat   Signs/Symptoms:  NPO status   Interventions: Tube feeding    Consultants:   Cardiothoracic surgery  Procedures:   Intubation  Antimicrobials:  Anti-infectives (From admission, onward)   Start     Dose/Rate Route Frequency Ordered Stop   01/30/20 1345  cefTRIAXone (ROCEPHIN) 2 g in sodium chloride 0.9 % 100 mL IVPB        2 g 200 mL/hr over 30 Minutes Intravenous Every 24 hours 01/30/20 1245 02/03/20 1327   01/29/20 1500  vancomycin (VANCOCIN) IVPB 1000 mg/200 mL premix  Status:  Discontinued         1,000 mg 200 mL/hr over 60 Minutes Intravenous Every 24 hours 01/28/20 1000 01/29/20 1156   01/28/20 1100  vancomycin (VANCOREADY) IVPB 2000 mg/400 mL        2,000 mg 200 mL/hr over 120 Minutes Intravenous  Once 01/28/20 1000 01/28/20 1648   01/28/20 1100  meropenem (MERREM) 1 g in sodium chloride 0.9 % 100 mL IVPB  Status:  Discontinued        1 g 200 mL/hr over 30 Minutes Intravenous Every 8 hours 01/28/20 1000 01/30/20 1245   01/21/20 1800  ceFEPIme (MAXIPIME) 2 g in sodium chloride 0.9 % 100 mL IVPB        2 g 200 mL/hr over 30 Minutes Intravenous Every 12 hours 01/21/20 0803 01/27/20 0541   01/21/20 1200  vancomycin (VANCOCIN) IVPB 1000 mg/200 mL premix  Status:  Discontinued        1,000 mg 200 mL/hr over 60 Minutes Intravenous Every M-W-F (Hemodialysis) 01/27/2020 0904 01/20/20 0902   01/20/20 1800  ceFEPIme (MAXIPIME) 2 g in sodium chloride 0.9 % 100 mL IVPB  Status:  Discontinued        2 g 200 mL/hr over 30 Minutes Intravenous Every 12 hours 01/20/20 0941 01/21/20 0803   01/20/20 0600  ceFEPIme (MAXIPIME) 1 g in sodium chloride 0.9 % 100 mL IVPB  Status:  Discontinued        1 g 200 mL/hr over 30 Minutes Intravenous Every 24 hours 01/16/2020 0903 01/20/20 0941   01/06/2020 0545  ceFEPIme (MAXIPIME) 2 g in sodium chloride 0.9 % 100 mL IVPB        2 g 200 mL/hr over 30 Minutes Intravenous  Once 01/30/2020 0537 01/11/2020 0712   02/02/2020 0545  vancomycin (VANCOREADY) IVPB 2000 mg/400 mL        2,000 mg 200 mL/hr over 120 Minutes Intravenous  Once 02/01/2020 0541 01/30/2020 0802         Subjective: Patient seen and examined in dialysis unit.  She seems like having tachypnea but denied any shortness of breath.  Looks anxious.  Objective: Vitals:   02/05/20 1030 02/05/20 1100 02/05/20 1216 02/05/20 1423  BP: 105/60 (!) 87/60 (!) 97/39 114/90  Pulse: 72 91 87 82  Resp:   (!) 24 19  Temp:   (!) 97.4 F (36.3 C) 98.1 F (36.7 C)  TempSrc:  Oral Oral Oral  SpO2:  100% 99% 97%    Weight:  89.8 kg    Height:        Intake/Output Summary (Last 24 hours) at 02/05/2020 1517 Last data filed at 02/05/2020 1100 Gross per 24 hour  Intake 290.26 ml  Output 0 ml  Net 290.26 ml   Filed Weights   02/05/20 0339 02/05/20 0727 02/05/20 1100  Weight: 89 kg 89.8 kg 89.8 kg    Examination:  General exam: Appears anxious and tachypneic Respiratory system: Diminished breath sounds at  the bases bilaterally. Respiratory effort normal.  Anterior bilateral rib cage tenderness. Cardiovascular system: S1 & S2 heard, RRR. No JVD, murmurs, rubs, gallops or clicks. No pedal edema. Gastrointestinal system: Abdomen is nondistended, soft and nontender. No organomegaly or masses felt. Normal bowel sounds heard. Central nervous system: Alert and oriented. No focal neurological deficits. Extremities: Symmetric 5 x 5 power.  Data Reviewed: I have personally reviewed following labs and imaging studies  CBC: Recent Labs  Lab 02/01/20 0354 02/02/20 0415 02/03/20 0409 02/04/20 0504 02/05/20 0602  WBC 24.1* 18.3* 13.7* 17.0* 17.0*  NEUTROABS 17.5* 15.6* 10.4* 14.1* 14.6*  HGB 8.7* 8.0* 7.8* 8.1* 8.2*  HCT 27.9* 25.8* 24.7* 26.4* 26.6*  MCV 113.0* 113.2* 114.4* 115.3* 115.2*  PLT 153 142* 102* 143* 366   Basic Metabolic Panel: Recent Labs  Lab 01/30/20 0314 01/30/20 1540 01/31/20 0410 01/31/20 1732 02/01/20 0354 02/01/20 0354 02/01/20 1646 02/01/20 1646 02/02/20 0415 02/02/20 1700 02/03/20 0409 02/04/20 0504 02/05/20 0602  NA 136   < > 135   < > 137   < > 137   < > 137 138 137 139 138  K 3.8   < > 3.3*   < > 3.8   < > 4.4   < > 4.5 4.3 3.8 4.4 5.6*  CL 100   < > 99   < > 101   < > 100   < > 101 102 102 102 101  CO2 24   < > 22   < > 24   < > 24   < > 23 25 22 23  20*  GLUCOSE 204*   < > 199*   < > 214*   < > 172*   < > 176* 137* 181* 160* 146*  BUN 31*   < > 32*   < > 35*   < > 32*   < > 33* 27* 30* 56* 90*  CREATININE 1.57*   < > 1.52*   < > 1.70*   < > 1.54*   < > 1.58*  1.34* 1.45* 3.13* 5.06*  CALCIUM 10.6*   < > 10.5*   < > 10.2   < > 10.3   < > 10.1 10.4* 10.2 11.0* 10.3  MG 2.6*  --  2.7*  --  2.5*  --   --   --  2.5*  --  2.6*  --   --   PHOS 3.3   < > 3.3   < > 3.1   < > 3.6  --  3.2 3.3 2.5 3.8  --    < > = values in this interval not displayed.   GFR: Estimated Creatinine Clearance: 9.6 mL/min (A) (by C-G formula based on SCr of 5.06 mg/dL (H)). Liver Function Tests: Recent Labs  Lab 02/01/20 1646 02/02/20 0415 02/02/20 1700 02/03/20 0409 02/04/20 0504  ALBUMIN 2.5* 2.6* 2.6* 2.3* 2.4*   No results for input(s): LIPASE, AMYLASE in the last 168 hours. No results for input(s): AMMONIA in the last 168 hours. Coagulation Profile: Recent Labs  Lab 02/01/20 0354 02/02/20 0415 02/03/20 0409 02/04/20 0504 02/05/20 0602  INR 1.2 1.1 1.2 1.1 1.2   Cardiac Enzymes: No results for input(s): CKTOTAL, CKMB, CKMBINDEX, TROPONINI in the last 168 hours. BNP (last 3 results) No results for input(s): PROBNP in the last 8760 hours. HbA1C: No results for input(s): HGBA1C in the last 72 hours. CBG: Recent Labs  Lab 02/04/20 1929 02/04/20 2145 02/05/20 0010 02/05/20 0344 02/05/20 1219  GLUCAP 130* 176* 201* 134* 134*   Lipid Profile: No results for input(s): CHOL, HDL, LDLCALC, TRIG, CHOLHDL, LDLDIRECT in the last 72 hours. Thyroid Function Tests: No results for input(s): TSH, T4TOTAL, FREET4, T3FREE, THYROIDAB in the last 72 hours. Anemia Panel: No results for input(s): VITAMINB12, FOLATE, FERRITIN, TIBC, IRON, RETICCTPCT in the last 72 hours. Sepsis Labs: No results for input(s): PROCALCITON, LATICACIDVEN in the last 168 hours.  Recent Results (from the past 240 hour(s))  Culture, blood (routine x 2)     Status: None   Collection Time: 01/28/20 11:43 AM   Specimen: BLOOD RIGHT ARM  Result Value Ref Range Status   Specimen Description BLOOD RIGHT ARM  Final   Special Requests   Final    BOTTLES DRAWN AEROBIC ONLY Blood Culture  adequate volume   Culture   Final    NO GROWTH 5 DAYS Performed at [facility], [address] [place], [place] [address]    Report Status 02/02/2020 FINAL  Final  Culture, blood (routine x 2)     Status: None   Collection Time: 01/28/20 11:43 AM   Specimen: BLOOD RIGHT ARM  Result Value Ref Range Status   Specimen Description BLOOD RIGHT ARM  Final   Special Requests   Final    BOTTLES DRAWN AEROBIC ONLY Blood Culture adequate volume   Culture   Final    NO GROWTH 5 DAYS Performed at [facility], [place] [address], [place], [place] [address]    Report Status 02/02/2020 FINAL  Final  Culture, respiratory (non-expectorated)     Status: None   Collection Time: 01/28/20 11:49 AM   Specimen: Tracheal Aspirate; Respiratory  Result Value Ref Range Status   Specimen Description TRACHEAL ASPIRATE  Final   Special Requests NONE  Final   Gram Stain   Final    RARE WBC PRESENT, PREDOMINANTLY PMN FEW GRAM NEGATIVE RODS Performed at [facility], [place] [address], [place], [place] [address]    Culture MODERATE SERRATIA MARCESCENS  Final   Report Status 01/30/2020 FINAL  Final   Organism ID, Bacteria SERRATIA MARCESCENS  Final      Susceptibility   Serratia marcescens - MIC*    CEFAZOLIN >=64 RESISTANT Resistant     CEFEPIME <=0.12 SENSITIVE Sensitive     CEFTAZIDIME <=1 SENSITIVE Sensitive     CEFTRIAXONE <=0.25 SENSITIVE Sensitive     CIPROFLOXACIN <=0.25 SENSITIVE Sensitive     GENTAMICIN <=1 SENSITIVE Sensitive     TRIMETH/SULFA <=20 SENSITIVE Sensitive     * MODERATE SERRATIA MARCESCENS      Radiology Studies: No results found.  Scheduled Meds: . B-complex with vitamin C  1 tablet Per Tube Daily  . Chlorhexidine Gluconate Cloth  6 each Topical Daily  . Chlorhexidine Gluconate Cloth  6 each Topical Q0600  . Darbepoetin Alfa      . darbepoetin (ARANESP) injection - DIALYSIS  100 mcg Intravenous Q Wed-HD  . feeding supplement (PRO-STAT SUGAR FREE 64)  30 mL  Per Tube Daily  . fludrocortisone  0.1 mg Per Tube Daily  . hydrocortisone sod succinate (SOLU-CORTEF) inj  50 mg Intravenous Q8H  . insulin aspart  0-6 Units Subcutaneous Q4H  . levothyroxine  75 mcg Per Tube Q0600  . mouth rinse  15 mL Mouth Rinse BID  . metoprolol tartrate  25 mg Per Tube BID  . midodrine  10 mg Per Tube TID  . pantoprazole sodium  40 mg Per  Tube Daily  . sodium chloride flush  10-40 mL Intracatheter Q12H  . warfarin  15 mg Per Tube ONCE-1600  . Warfarin - Pharmacist Dosing Inpatient   Does not apply q1600   Continuous Infusions: . sodium chloride Stopped (02/04/20 1059)  . amiodarone 30 mg/hr (02/05/20 1212)  . feeding supplement (VITAL 1.5 CAL) 50 mL/hr (02/04/20 2117)     LOS: 17 days   Time spent: 30 minutes   Darliss Cheney, MD [facility]  02/05/2020, 3:17 PM   To contact the attending provider between 7A-7P or the covering provider during after hours 7P-7A, please log into the web site [email or web address].

## 2020-02-05 NOTE — Progress Notes (Signed)
Sabrina Beck Progress Note  Subjective: had HD upstairs this am, BP's dropped rapidly into the 70's, no UF today on HD  Vitals:   02/05/20 1030 02/05/20 1100 02/05/20 1216 02/05/20 1423  BP: 105/60 (!) 87/60 (!) 97/39 114/90  Pulse: 72 91 87 82  Resp:   (!) 24 19  Temp:   (!) 97.4 F (36.3 C) 98.1 F (36.7 C)  TempSrc:  Oral Oral Oral  SpO2:  100% 99% 97%  Weight:  89.8 kg    Height:        Exam: Gen seen in HD, very weak, NG feeding tube in  No jvd or bruits Chest occ rhonchi, improved RRR no MRG Abd soft ntnd no mass or ascites +bs Ext no pitting edema  Neuro responsive LFA AVF+bruit   CXR 6/29 - basilar atx, no edema or pvc   OP HD: [facility] MWF  3.5h  2/2.5 bath  97.5kg  No heparin  LFA AVF  - EPO 6600 q Rx   Assessment/ Plan: 1. Cardiac arrest - resuscitated in ED 2. Sepsis/ shock/ PNA - better, on IV abx, on midodrine 10 tid 3. Type B aortic dissection - felt due to CPR, seen by TCTS and plan is for medical management 4. Acute resp failure - extubated, on room air this am 5. BP/ volume -  successful UF last week and down 7kg under prior dry wt  6. ESRD - on HD MWF.  SP CRRT 6/15 - 6.29. HD today and next HD Friday to get back on schedule.  7. MBD ckd -  Ca up slightly. P repleted 6/22 8. Anemia ckd - Hb 9.1 today, transfuse prn.  darbe 100ug  weekly while here. Tsat 89%, no need for IV Fe. 9. Atrial fibrillation - getting metoprolol 25 bid for rate control 10. Hypercalcemia-  Developed in house-  Maybe due to being immobility, no need Rx yet 11. Partial DNR - PCT working w/ family, is now NO code blue/ cpr/ shock/ meds, YES intubation/ bipap. Very frail at this time.    Kelly Splinter, MD 02/05/2020, 3:25 PM       Recent Labs  Lab 02/03/20 0409 02/03/20 0409 02/04/20 0504 02/05/20 0602  K 3.8   < > 4.4 5.6*  BUN 30*   < > 56* 90*  CREATININE 1.45*   < > 3.13* 5.06*  CALCIUM 10.2   < > 11.0* 10.3  PHOS 2.5  --  3.8  --    HGB 7.8*   < > 8.1* 8.2*   < > = values in this interval not displayed.   Inpatient medications: . B-complex with vitamin C  1 tablet Per Tube Daily  . Chlorhexidine Gluconate Cloth  6 each Topical Daily  . Chlorhexidine Gluconate Cloth  6 each Topical Q0600  . Darbepoetin Alfa      . darbepoetin (ARANESP) injection - DIALYSIS  100 mcg Intravenous Q Wed-HD  . feeding supplement (PRO-STAT SUGAR FREE 64)  30 mL Per Tube Daily  . fludrocortisone  0.1 mg Per Tube Daily  . hydrocortisone sod succinate (SOLU-CORTEF) inj  50 mg Intravenous Q8H  . insulin aspart  0-6 Units Subcutaneous Q4H  . levothyroxine  75 mcg Per Tube Q0600  . mouth rinse  15 mL Mouth Rinse BID  . metoprolol tartrate  25 mg Per Tube BID  . midodrine  10 mg Per Tube TID  . pantoprazole sodium  40 mg Per Tube Daily  . sodium chloride  flush  10-40 mL Intracatheter Q12H  . warfarin  15 mg Per Tube ONCE-1600  . Warfarin - Pharmacist Dosing Inpatient   Does not apply q1600   . sodium chloride Stopped (02/04/20 1059)  . amiodarone 30 mg/hr (02/05/20 1212)  . feeding supplement (VITAL 1.5 CAL) 50 mL/hr (02/04/20 2117)   sodium chloride, acetaminophen (TYLENOL) oral liquid 160 mg/5 mL, heparin, ipratropium-albuterol, lip balm, polyethylene glycol, sodium chloride flush

## 2020-02-05 NOTE — Progress Notes (Signed)
Nutrition Follow-up  DOCUMENTATION CODES:   Obesity unspecified  INTERVENTION:   Continue TF via Cortrak: Vital 1.5 at 50 ml/h Pro-stat 30 ml daily  Provides 1900 kcal, 96 gm protein, 912 ml free water daily.  D/C B-complex with vitamin C.  Add Rena-vit daily.  NUTRITION DIAGNOSIS:   Inadequate oral intake related to inability to eat as evidenced by NPO status.  Ongoing  GOAL:   Patient will meet greater than or equal to 90% of their needs  Met with TF  MONITOR:   Diet advancement, PO intake, Labs, TF tolerance, Skin  REASON FOR ASSESSMENT:   Ventilator    ASSESSMENT:   79 year old female who presented on 6/14 with respiratory distress and cardiac arrest immediately upon arrival to the ED. Pt required intubation. PMH of ESRD on HD, atrial fibrillation, CHF. Pt did not require TTM.  CRRT d/c 6/29. Receiving HD today. Remains NPO. Cortrak in place with tip in the stomach. Receiving Vital 1.5 at 50 ml/h with Pro-stat 30 ml once daily. Meeting 100% of estimated needs. Tolerating well.  Plans for swallow evaluation with SLP tomorrow.   Labs reviewed. K 5.6, BUN 90, creat 5.06  CBG: 134-134  Medications reviewed and include solu-cortef, novolog, B-complex with vitamin C.  Weight 89.8 kg today, 102.1 kg on 6/15.  Diet Order:   Diet Order            Diet NPO time specified  Diet effective now                 EDUCATION NEEDS:   No education needs have been identified at this time  Skin:  Skin Assessment: Reviewed RN Assessment (MASD to groin, breast)  Last BM:  6/30  Height:   Ht Readings from Last 1 Encounters:  01/16/2020 5' 4"  (1.626 m)    Weight:   Wt Readings from Last 1 Encounters:  02/05/20 89.8 kg    Ideal Body Weight:  54.5 kg  BMI:  Body mass index is 33.98 kg/m.  Estimated Nutritional Needs:   Kcal:  1700-1900 kcals  Protein:  90-110 g  Fluid:  1000 ml + UOP    Lucas Mallow, RD, LDN, CNSC Please refer to Amion for  contact information.

## 2020-02-05 NOTE — Progress Notes (Signed)
PT Cancellation Note  Patient Details Name: Sabrina Beck MRN: [id number] DOB: 04-25-1941   Cancelled Treatment:    Reason Eval/Treat Not Completed: (P) Patient at procedure or test/unavailable Pt is currently in HD. PT will follow back this afternoon for treatment as able.   Makhya Arave B. Migdalia Dk PT, DPT Acute Rehabilitation Services Pager [phone] Office [phone]    [place] 02/05/2020, 9:25 AM

## 2020-02-05 NOTE — Consult Note (Addendum)
Consultation Note Date: 02/05/2020   Patient Name: Sabrina Beck  DOB: 12/18/1940  MRN: [id number]  Age / Sex: 79 y.o., female  PCP: Redmond School, MD Referring Physician: Darliss Cheney, MD  Reason for Consultation: Establishing goals of care  HPI/Patient Profile: 79 y.o. 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 Failed extubation x 1 but now extubated. Also found to have Type B aortic dissection (from trauma of CPR?) and continues with dysphagia with plans for FEES 02/05/20.   Clinical Assessment and Goals of Care: I met today with Sabrina Beck and no family at bedside. I have previously met with Sabrina Beck during her admission at [facility] in Sept 2020. She appears much declined since September. She is severely weak and fatigued. Unable to verbalize response with weak voice and eyes are heavy to even stay alert for our brief interaction.   I reached out to discuss further with son, Sabrina Beck. I had a conversation with Sabrina Beck regarding his mother's current condition and events of this hospitalization. I express my concern with her underlying chronic illness and gradual decline that these acute events may continue to lead [place] to more complications and poor quality of life. My concerns that she could get worse is difficult for Sabrina Beck to process. He shares that they have been very pleased with her progress over these past few days. He shares that they were recently making funeral homes and then she seemed to turn around and begin to improve. I expressed to him that I understand and she is certainly in a much better place now than she was previously  in hospitalization. However, she still has a very, very long way to go.   Sabrina Beck understands. He expresses that [name] has expressed to them that she wants to live. They want to give her time to see if she can continue to gain even small improvements. I expressed that I agree we give her some time but I also encourage them to consider what we should do if she were to get worse instead of better. I worry that she is high risk for further complications that could add to her weakness and decline and make any improvement even farther away. I expressed that they consider if she should be reintubated seeing how she struggles to even speak and swallow as a side effect of this intervention and that the benefit would be questionable. I encouraged them to hope for the best but also to plan for the worst at the same time. Sabrina Beck will continue conversations with his brother.   All questions/concerns addressed. Emotional support provided. Please call for any acute palliative needs over the weekend.   Primary Decision Maker Patient may be able to contribute but orientation and understanding difficult to fully assess  as she cannot speak. 2 sons are surrogate decision makers.     SUMMARY OF RECOMMENDATIONS   - Watchful waiting.  - Family hopeful for improvement.  - I have encouraged further conversation about wishes if she were to decline as she is at high risk for further complications and decline.   Code Status/Advance Care Planning:  Limited code   Symptom Management:   Per primary. SLP following for dysphagia.   Palliative Prophylaxis:   Aspiration, Bowel Regimen, Delirium Protocol, Oral Care and Turn Reposition  Psycho-social/Spiritual:   Desire for further Chaplaincy support:yes  Prognosis:   Overall prognosis poor with severe chronic co-morbidities and ongoing high risk for acute decompensation.   Discharge Planning: To Be Determined      Primary Diagnoses: Present on Admission: .  Cardiac arrest ([place])   I have reviewed the medical record, interviewed the patient and family, and examined the patient. The following aspects are pertinent.  Past Medical History:  Diagnosis Date  . Anemia   . Atrial fibrillation ([place])   . Chest pain 05/15/2006   Stress test negative for ischemia  . Diastolic heart failure ([place])   . Diverticulosis   . ESRD on hemodialysis ([place])   . GERD (gastroesophageal reflux disease)   . Headache   . Mixed hyperlipidemia   . Peripheral neuropathy   . PONV (postoperative nausea and vomiting)   . Systemic hypertension   . Uterine cancer [facility])    Social History   Socioeconomic History  . Marital status: Widowed    Spouse name: Not on file  . Number of children: 2  . Years of education: Not on file  . Highest education level: High school graduate  Occupational History  . Occupation: retired Oncologist   Tobacco Use  . Smoking status: Former Smoker    Quit date: 08/08/1999    Years since quitting: 20.5  . Smokeless tobacco: Never Used  Vaping Use  . Vaping Use: Never used  Substance and Sexual Activity  . Alcohol use: No    Alcohol/week: 0.0 standard drinks  . Drug use: No  . Sexual activity: Not Currently  Other Topics Concern  . Not on file  Social History Narrative   Lives in a one story home.  One of her sons lives with her.  Has 2 sons.  Retired Oncologist.  Eduction: high school.   Social Determinants of Health   Financial Resource Strain:   . Difficulty of Paying Living Expenses:   Food Insecurity:   . Worried About Charity fundraiser in the Last Year:   . Arboriculturist in the Last Year:   Transportation Needs: No Transportation Needs  . Lack of Transportation (Medical): No  . Lack of Transportation (Non-Medical): No  Physical Activity:   . Days of Exercise per Week:   . Minutes of Exercise per Session:   Stress:   . Feeling of Stress :   Social Connections: Unknown  . Frequency of Communication with  Friends and Family: Not on file  . Frequency of Social Gatherings with Friends and Family: Not on file  . Attends Religious Services: Not on file  . Active Member of Clubs or Organizations: Not on file  . Attends Archivist Meetings: Not on file  . Marital Status: Widowed   Family History  Problem Relation Age of Onset  . Diabetes Mother   . Hypertension Mother   . Diabetes Father   . Heart disease Father  before age 61  . Heart attack Father   . Diabetes Sister   . Hyperlipidemia Sister   . Hypertension Sister   . Cancer Brother   . Diabetes Brother   . Heart disease Brother   . Hypertension Brother   . Heart attack Brother   . Colon cancer Neg Hx    Scheduled Meds: . B-complex with vitamin C  1 tablet Per Tube Daily  . Chlorhexidine Gluconate Cloth  6 each Topical Daily  . Chlorhexidine Gluconate Cloth  6 each Topical Q0600  . Darbepoetin Alfa      . darbepoetin (ARANESP) injection - DIALYSIS  100 mcg Intravenous Q Wed-HD  . feeding supplement (PRO-STAT SUGAR FREE 64)  30 mL Per Tube Daily  . fludrocortisone  0.1 mg Per Tube Daily  . hydrocortisone sod succinate (SOLU-CORTEF) inj  50 mg Intravenous Q8H  . insulin aspart  0-6 Units Subcutaneous Q4H  . levothyroxine  75 mcg Per Tube Q0600  . mouth rinse  15 mL Mouth Rinse BID  . metoprolol tartrate  25 mg Per Tube BID  . midodrine  10 mg Per Tube TID  . pantoprazole sodium  40 mg Per Tube Daily  . sodium chloride flush  10-40 mL Intracatheter Q12H  . Warfarin - Pharmacist Dosing Inpatient   Does not apply q1600   Continuous Infusions: . sodium chloride Stopped (02/04/20 1059)  . amiodarone 30 mg/hr (02/05/20 1212)  . feeding supplement (VITAL 1.5 CAL) 50 mL/hr (02/04/20 2117)   PRN Meds:.sodium chloride, acetaminophen (TYLENOL) oral liquid 160 mg/5 mL, heparin, ipratropium-albuterol, lip balm, polyethylene glycol, sodium chloride flush No Known Allergies Review of Systems  Unable to perform ROS:  Acuity of condition    Physical Exam Vitals and nursing note reviewed.  Constitutional:      General: She is not in acute distress.    Appearance: She is ill-appearing.  Cardiovascular:     Rate and Rhythm: Normal rate.  Pulmonary:     Effort: Pulmonary effort is normal. No tachypnea, accessory muscle usage or respiratory distress.  Abdominal:     Palpations: Abdomen is soft.  Neurological:     Mental Status: She is easily aroused.     Comments: Difficult to fully assess orientation and understanding given inability to speak     Vital Signs: BP (!) 97/39 (BP Location: Right Arm)   Pulse 87   Temp (!) 97.4 F (36.3 C) (Oral)   Resp (!) 24   Ht 5' 4"  (1.626 m)   Wt 89.8 kg   SpO2 99%   BMI 33.98 kg/m  Pain Scale: CPOT   Pain Score: 0-No pain   SpO2: SpO2: 99 % O2 Device:SpO2: 99 % O2 Flow Rate: .O2 Flow Rate (L/min): 2 L/min  IO: Intake/output summary:   Intake/Output Summary (Last 24 hours) at 02/05/2020 1223 Last data filed at 02/05/2020 1100 Gross per 24 hour  Intake 490.33 ml  Output 0 ml  Net 490.33 ml    LBM: Last BM Date: 02/04/20 Baseline Weight: Weight: 98.9 kg Most recent weight: Weight: 89.8 kg     Palliative Assessment/Data:     Time In: 1400 Time Out: 1500 Time Total: 60 min Greater than 50%  of this time was spent counseling and coordinating care related to the above assessment and plan.  Signed by: Vinie Sill, NP Palliative Medicine Team Pager # [phone] (M-F 8a-5p) Team Phone # [phone] (Nights/Weekends)

## 2020-02-05 NOTE — Progress Notes (Signed)
SLP Cancellation Note  Patient Details Name: SUZY KUGEL MRN: [id number] DOB: 1941-05-18   Cancelled treatment:       Reason Eval/Treat Not Completed: Patient at procedure or test/unavailable. Planned for FEES today to assess swallowing, but pt in HD. Will attempt tomorrow at 1130. Son is aware of plan.    Winslow Verrill, Katherene Ponto 02/05/2020, 9:05 AM

## 2020-02-05 NOTE — Progress Notes (Signed)
ANTICOAGULATION CONSULT NOTE  Pharmacy Consult for warfarin Indication: atrial fibrillation  No Known Allergies  Patient Measurements: Height: 5\' 4"  (162.6 cm) Weight: 93.3 kg (205 lb 11 oz) IBW/kg (Calculated) : 54.7 Heparin Dosing Weight: 75.8 kg  Vital Signs: Temp: 98.8 F (37.1 C) (06/22 1121) Temp Source: Axillary (06/22 1121) BP: 83/58 (06/22 1215) Pulse Rate: 111 (06/22 1215)  Labs: Recent Labs    01/25/20 0231 01/25/20 1549 01/26/20 0331 01/26/20 1633 01/27/20 0308  HGB 7.8*  --  7.8*  --  8.1*  HCT 24.1*  --  25.1*  --  26.1*  PLT 142*  --  136*  --  126*  APTT 32  --  27  --  33  LABPROT 15.0  --  14.8  --  16.3*  INR 1.2  --  1.2  --  1.4*  CREATININE 1.61*   < > 1.66* 1.75* 1.74*   < > = values in this interval not displayed.    Estimated Creatinine Clearance: 28.5 mL/min (A) (by C-G formula based on SCr of 1.74 mg/dL (H)).   Assessment: 46 yof on warfarin PTA for history of afib admitted with respiratory distress with cardiac arrest on arrival to ER with ROSC.   Resumed warfarin 6/29, Prior to admit warfarin regimen is 10mg  daily except 5mg  on Fridays.   INR 1.2 today. Hgb stable low 8s. Patient continues on amiodarone infusion.   Goal of Therapy:  INR 2-3 Monitor platelets by anticoagulation protocol: Yes   Plan:  Warfarin 15 mg x 1 in hopes of transitioning back to home dose over weekend.  Daily INR  Erin Hearing PharmD., BCPS Clinical Pharmacist 02/05/2020 1:59 PM

## 2020-02-05 DEATH — deceased

## 2020-02-06 ENCOUNTER — Inpatient Hospital Stay (HOSPITAL_COMMUNITY): Payer: Medicare HMO

## 2020-02-06 DIAGNOSIS — R0603 Acute respiratory distress: Secondary | ICD-10-CM

## 2020-02-06 DIAGNOSIS — Z515 Encounter for palliative care: Secondary | ICD-10-CM

## 2020-02-06 DIAGNOSIS — I509 Heart failure, unspecified: Secondary | ICD-10-CM

## 2020-02-06 LAB — BASIC METABOLIC PANEL
Anion gap: 16 — ABNORMAL HIGH (ref 5–15)
BUN: 43 mg/dL — ABNORMAL HIGH (ref 8–23)
CO2: 25 mmol/L (ref 22–32)
Calcium: 10 mg/dL (ref 8.9–10.3)
Chloride: 98 mmol/L (ref 98–111)
Creatinine, Ser: 3.29 mg/dL — ABNORMAL HIGH (ref 0.44–1.00)
GFR calc Af Amer: 15 mL/min — ABNORMAL LOW (ref >60)
GFR calc non Af Amer: 13 mL/min — ABNORMAL LOW (ref >60)
Glucose, Bld: 199 mg/dL — ABNORMAL HIGH (ref 70–99)
Potassium: 4.3 mmol/L (ref 3.5–5.1)
Sodium: 139 mmol/L (ref 135–145)

## 2020-02-06 LAB — CBC WITH DIFFERENTIAL/PLATELET
Abs Immature Granulocytes: 0.14 10*3/uL — ABNORMAL HIGH (ref 0.00–0.07)
Basophils Absolute: 0 10*3/uL (ref 0.0–0.1)
Basophils Relative: 0 %
Eosinophils Absolute: 0 10*3/uL (ref 0.0–0.5)
Eosinophils Relative: 0 %
HCT: 27.5 % — ABNORMAL LOW (ref 36.0–46.0)
Hemoglobin: 8.5 g/dL — ABNORMAL LOW (ref 12.0–15.0)
Immature Granulocytes: 1 %
Lymphocytes Relative: 15 %
Lymphs Abs: 1.7 10*3/uL (ref 0.7–4.0)
MCH: 34.7 pg — ABNORMAL HIGH (ref 26.0–34.0)
MCHC: 30.9 g/dL (ref 30.0–36.0)
MCV: 112.2 fL — ABNORMAL HIGH (ref 80.0–100.0)
Monocytes Absolute: 0.8 10*3/uL (ref 0.1–1.0)
Monocytes Relative: 7 %
Neutro Abs: 8.8 10*3/uL — ABNORMAL HIGH (ref 1.7–7.7)
Neutrophils Relative %: 77 %
Platelets: 163 10*3/uL (ref 150–400)
RBC: 2.45 MIL/uL — ABNORMAL LOW (ref 3.87–5.11)
RDW: 21.2 % — ABNORMAL HIGH (ref 11.5–15.5)
WBC: 11.5 10*3/uL — ABNORMAL HIGH (ref 4.0–10.5)
nRBC: 0 % (ref 0.0–0.2)

## 2020-02-06 LAB — GLUCOSE, CAPILLARY
Glucose-Capillary: 140 mg/dL — ABNORMAL HIGH (ref 70–99)
Glucose-Capillary: 148 mg/dL — ABNORMAL HIGH (ref 70–99)
Glucose-Capillary: 151 mg/dL — ABNORMAL HIGH (ref 70–99)
Glucose-Capillary: 159 mg/dL — ABNORMAL HIGH (ref 70–99)
Glucose-Capillary: 168 mg/dL — ABNORMAL HIGH (ref 70–99)
Glucose-Capillary: 181 mg/dL — ABNORMAL HIGH (ref 70–99)

## 2020-02-06 LAB — BLOOD GAS, ARTERIAL
Acid-Base Excess: 2.6 mmol/L — ABNORMAL HIGH (ref 0.0–2.0)
Bicarbonate: 27.2 mmol/L (ref 20.0–28.0)
Drawn by: 23604
FIO2: 30
O2 Saturation: 90.6 %
Patient temperature: 36.5
pCO2 arterial: 45.3 mmHg (ref 32.0–48.0)
pH, Arterial: 7.393 (ref 7.350–7.450)
pO2, Arterial: 60.4 mmHg — ABNORMAL LOW (ref 83.0–108.0)

## 2020-02-06 LAB — PROTIME-INR
INR: 1.2 (ref 0.8–1.2)
Prothrombin Time: 14.5 seconds (ref 11.4–15.2)

## 2020-02-06 MED ORDER — CHLORHEXIDINE GLUCONATE CLOTH 2 % EX PADS
6.0000 | MEDICATED_PAD | Freq: Every day | CUTANEOUS | Status: DC
  Administered 2020-02-07: 6 via TOPICAL

## 2020-02-06 MED ORDER — WARFARIN SODIUM 7.5 MG PO TABS
15.0000 mg | ORAL_TABLET | Freq: Once | ORAL | Status: AC
  Administered 2020-02-06: 15 mg via ORAL
  Filled 2020-02-06 (×2): qty 2

## 2020-02-06 NOTE — Progress Notes (Signed)
Physical Therapy Treatment Patient Details Name: Sabrina Beck MRN: [id number] DOB: [birth year] Today's Date: 02/06/2020    History of Present Illness 79 year old female with ESRD on HD presented to [facility] emergency department 6/14 with respiratory distress and suffered cardiac arrest immediately upon arrival.  Brief arrest and responsive after.  Intubated during arrest and transferred to [facility] ED for ICU admission.  Pt was intubated from 6/14-6/19 and then re-intubated from 6/19-6/28.    PT Comments    Spoke with pt RN prior to session and she relayed that the pt has had low BP and increased work of breathing since HD this morning. Pt agreeable to bed exercises today. Given increased fatigue assisted pt with LE/UE AAROM. PT hopeful pt will be able to perform bed mobility in future session. D/c plans remain appropriate at this time. PT will continue to follow acute.    Follow Up Recommendations  SNF;Other (comment) (but working toward home.)     Equipment Recommendations  Other (comment) (TBA)       Precautions / Restrictions Precautions Precautions: Fall    Mobility  Bed Mobility               General bed mobility comments: pt with decreased blood pressure and increased work of breathing mobility deferred today                Cognition Arousal/Alertness: Lethargic Behavior During Therapy: Flat affect Overall Cognitive Status: Difficult to assess                                 General Comments: pt agreeable to working with threapy despite obvious increased work of breathing      Exercises General Exercises - Upper Extremity Shoulder Horizontal ABduction: AROM;Both;10 reps;Supine Digit Composite Flexion: AROM;Both;10 reps;Supine Composite Extension: AROM;Both;10 reps;Supine General Exercises - Lower Extremity Ankle Circles/Pumps: AROM;Both;10 reps;Supine Quad Sets: AROM;Both;10 reps;Supine Heel Slides: Both;10 reps;AAROM;Supine Hip  ABduction/ADduction: AAROM;Both;10 reps;Supine Straight Leg Raises: Both;10 reps;AAROM;Supine    General Comments General comments (skin integrity, edema, etc.): HR increased to high 80s, SaO2 on 4L >93%O2 throughout session,       Pertinent Vitals/Pain Pain Assessment: Faces Faces Pain Scale: Hurts a little bit Pain Location: chest discomfort Pain Descriptors / Indicators: Discomfort;Grimacing Pain Intervention(s): Limited activity within patient's tolerance;Monitored during session;Repositioned           PT Goals (current goals can now be found in the care plan section) Acute Rehab PT Goals Patient Stated Goal: pt did not state PT Goal Formulation: With patient Time For Goal Achievement: 02/18/20 Potential to Achieve Goals: Fair Progress towards PT goals: Not progressing toward goals - comment (increase work of breathing after HD today and low BP)    Frequency    Min 3X/week      PT Plan Current plan remains appropriate       AM-PAC PT "6 Clicks" Mobility   Outcome Measure  Help needed turning from your back to your side while in a flat bed without using bedrails?: Total Help needed moving from lying on your back to sitting on the side of a flat bed without using bedrails?: Total Help needed moving to and from a bed to a chair (including a wheelchair)?: Total Help needed standing up from a chair using your arms (e.g., wheelchair or bedside chair)?: Total Help needed to walk in hospital room?: Total Help needed climbing 3-5 steps with a railing? :  Total 6 Click Score: 6    End of Session Equipment Utilized During Treatment: Oxygen Activity Tolerance: Patient tolerated treatment well;Patient limited by fatigue Patient left: in chair;with call bell/phone within reach;with chair alarm set;with family/visitor present Nurse Communication: Mobility status PT Visit Diagnosis: Unsteadiness on feet (R26.81);Muscle weakness (generalized) (M62.81);Difficulty in walking, not  elsewhere classified (R26.2)     Time: 0998-3382 PT Time Calculation (min) (ACUTE ONLY): 10 min  Charges:  $Therapeutic Exercise: 8-22 mins                     Keily Lepp B. Migdalia Dk PT, DPT Acute Rehabilitation Services Pager [phone] Office [phone]    [place] 02/06/2020, 5:15 PM

## 2020-02-06 NOTE — Progress Notes (Signed)
[facility] Progress Note  Subjective: SOB early and CXR showed worsening CHF  Vitals:   02/06/20 1030 02/06/20 1040 02/06/20 1151 02/06/20 1221  BP: (!) 98/42 (!) 99/33 (!) 82/40 (!) 103/42  Pulse: 96 94 85 88  Resp: (!) 25 (!) 27 (!) 22 (!) 25  Temp:  97.9 F (36.6 C) 97.9 F (36.6 C) 98 F (36.7 C)  TempSrc:   Axillary Axillary  SpO2:  100% 98% 98%  Weight:  87.7 kg    Height:        Exam: Gen seen in HD, on FM O2 No jvd or bruits Chest mostly clear bilat RRR no MRG Abd soft ntnd no mass or ascites +bs Ext 1-2+ UE edema  Neuro responsive LFA AVF+bruit   CXR 6/29 - basilar atx, no edema or pvc   OP HD: [facility] MWF  3.5h  2/2.5 bath  97.5kg  No heparin  LFA AVF  - EPO 6600 q Rx   Assessment/ Plan: 1. Cardiac arrest - resuscitated in ED 2. Sepsis/ shock/ PNA - better, on IV abx, on midodrine 10 tid 3. Type B aortic dissection - felt due to CPR, seen by TCTS > med Rx 4. Acute resp failure - recurrent issue, looks wet by CXR. Plan HD today and tomorrow to get vol down.  5. BP/ volume - vol overload needs close watch and maybe extra hd 6. ESRD - on HD MWF.  SP CRRT 6/15 - 6.29. HD today. Extra HD tomorrow to get more volume off.  7. MBD ckd -  Ca up slightly. P repleted 6/22 8. Anemia ckd - Hb 9.1 today, transfuse prn.  darbe 100ug  weekly while here. Tsat 89%, no need for IV Fe. 9. Atrial fibrillation - getting metoprolol 25 bid for rate control 10. Hypercalcemia-  Developed in house-  Maybe due to being immobility, no need Rx yet 11. Partial DNR - PCT working w/ family, is now NO code blue/ cpr/ shock/ meds, YES intubation/ bipap. Very frail at this time.    Kelly Splinter, MD 02/06/2020, 1:08 PM       Recent Labs  Lab 02/03/20 0409 02/03/20 0409 02/04/20 0504 02/04/20 0504 02/05/20 0602 02/06/20 0330  K 3.8   < > 4.4   < > 5.6* 4.3  BUN 30*   < > 56*   < > 90* 43*  CREATININE 1.45*   < > 3.13*   < > 5.06* 3.29*  CALCIUM  10.2   < > 11.0*   < > 10.3 10.0  PHOS 2.5  --  3.8  --   --   --   HGB 7.8*   < > 8.1*   < > 8.2* 8.5*   < > = values in this interval not displayed.   Inpatient medications: . B-complex with vitamin C  1 tablet Per Tube Daily  . Chlorhexidine Gluconate Cloth  6 each Topical Q0600  . darbepoetin (ARANESP) injection - DIALYSIS  100 mcg Intravenous Q Wed-HD  . feeding supplement (PRO-STAT SUGAR FREE 64)  30 mL Per Tube Daily  . fludrocortisone  0.1 mg Per Tube Daily  . hydrocortisone sod succinate (SOLU-CORTEF) inj  50 mg Intravenous Q8H  . insulin aspart  0-6 Units Subcutaneous Q4H  . levothyroxine  75 mcg Per Tube Q0600  . mouth rinse  15 mL Mouth Rinse BID  . metoprolol tartrate  25 mg Per Tube BID  . midodrine  10 mg Per Tube TID  .  pantoprazole sodium  40 mg Per Tube Daily  . sodium chloride flush  10-40 mL Intracatheter Q12H  . warfarin  15 mg Oral ONCE-1600  . Warfarin - Pharmacist Dosing Inpatient   Does not apply q1600   . sodium chloride Stopped (02/04/20 1059)  . amiodarone 30 mg/hr (02/06/20 1228)  . feeding supplement (VITAL 1.5 CAL) 50 mL/hr (02/04/20 2117)   sodium chloride, acetaminophen (TYLENOL) oral liquid 160 mg/5 mL, heparin, ipratropium-albuterol, lip balm, polyethylene glycol, sodium chloride flush

## 2020-02-06 NOTE — Progress Notes (Signed)
PT Cancellation Note  Patient Details Name: PEYTAN ANDRINGA MRN: [id number] DOB: 06/12/1941   Cancelled Treatment:    Reason Eval/Treat Not Completed: (P) Patient at procedure or test/unavailable Pt off floor for HD. PT will follow back for treatment this afternoon.   Cyndy Braver B. Migdalia Dk PT, DPT Acute Rehabilitation Services Pager [phone] Office [phone]    [place] 02/06/2020, 8:21 AM

## 2020-02-06 NOTE — Progress Notes (Signed)
Daily Progress Note   Patient Name: Sabrina Beck       Date: 02/06/2020 DOB: 15-Jul-1941  Age: 79 y.o. MRN#: [id number] Attending Physician: Darliss Cheney, MD Primary Care Physician: Redmond School, MD Admit Date: 01/14/2020  Reason for Consultation/Follow-up: To discuss complex medical decision making related to patient's goals of care  Received call from floor RN.  Patient had a change in status overnight requiring rapid response and Bipap.  Chart reviewed.  Discussed with Nephrology.  Subjective: Met at bedside with [name].  She is awake and pleasant but very dyspneic and unable to speak more than a word or two.  She reported that she is comfortable.    I spoke with her son [name] on the phone.  We talked about the immediate problem of overall weakening of her heart, lungs, and kidneys.  She is having difficulty with fluid removal thru hemodialysis due to hypotension.  We briefly discussed code status.  I recommend he consider full DNR as a protective measure.  [name] and his family need to think about the change.  We made a plan to meet in person on 7/3.   Assessment: 79 yo female with progressive weakness and dyspnea today.  Cortrak in place.  Respiratory status appears to be worsening.   Patient Profile/HPI:  79 y.o. female  with past medical history of ESRD on HD (for ~15 yrs), atrial fibrillation (on coumadin), systolic and diastolic CHF (EF 24%), HTN, GERD, history of uterine cancer, recent hospitalizations for sepsis pneumonia Sept 20 and Feb 21 admitted on 01/31/2020 with sepsis pneumonia with respiratory and then cardiac arrest. Failed extubation x 1 but now extubated. Also found to have Type B aortic dissection (from trauma of CPR?) and continues with dysphagia.  Unable to have  Fees study due to respiratory difficulties.   Length of Stay: 18   Vital Signs: BP (!) 103/42 (BP Location: Right Wrist) Comment: Simultaneous filing. User may not have seen previous data.  Pulse 88 Comment: Simultaneous filing. User may not have seen previous data.  Temp 98 F (36.7 C) (Axillary)   Resp (!) 25 Comment: Simultaneous filing. User may not have seen previous data.  Ht 5' 4"  (1.626 m)   Wt 87.7 kg   SpO2 98% Comment: Simultaneous filing. User may not have seen previous data.  BMI 33.19 kg/m  SpO2: SpO2: 98 % (Simultaneous filing. User may not have seen previous data.) O2 Device: O2 Device: Nasal Cannula O2 Flow Rate: O2 Flow Rate (L/min): 2 L/min       Palliative Assessment/Data: 10%     Palliative Care Plan    Recommendations/Plan:  PMT will follow up with patient and family on 7/3.  Will recommend DNR and evaluate patient's goals of care.  Code Status:  Limited code.   No CPR, ok to intubate.  Prognosis:   At very high risk of acute decline, re-hospitalization or death.  I'm concerned that she may have only days to weeks if we are unable to remove excess fluid from her body.  Discharge Planning:  To Be Determined  Thank you for allowing the Palliative Medicine Team to assist in the care of this patient.  Total time spent:  35 min.     Greater than 50%  of this time was spent counseling and coordinating care related to the above assessment and plan.  Florentina Jenny, PA-C Palliative Medicine  Please contact Palliative MedicineTeam phone at [phone] for questions and concerns between 7 am - 7 pm.   Please see AMION for individual provider pager numbers.

## 2020-02-06 NOTE — Progress Notes (Signed)
PROGRESS NOTE    Sabrina Beck  NIO:[id number] DOB: 02-15-1941 DOA: 02/04/2020 PCP: Redmond School, MD   Brief Narrative:  79 yo female with past medical history of A. fib on Coumadin, ESRD on HD MWF, diastolic CHF, diverticulitis, GERD, HLD, neuropathy and hypertension and uterine cancer presented to [facility] on 6/14 with respiratory leading to cardiac arrest from pneumonia.    Patient failed extubation attempt on 01/24/2020 requiring reintubation and subsequently was successfully extubated on 01/25/2020.  CT head negative for acute pathology.  CT angiogram chest, abdomen and pelvis on 01/28/2020 showed type II aortic dissection, small pericardial effusion and small left hemothorax.  Seen by cardiothoracic surgery who recommended medical management.  Echo on 01/28/2020 showed ejection fraction 50 to 55%, mild LVH, grade 2 DD, moderate LA dilatation.  Patient was initially started on cefepime and vancomycin which were switched to Merrem and vancomycin on 01/28/2020 and then switched to Rocephin 01/30/2020.  Assessment & Plan:   Active Problems:   ESRD (end stage renal disease) on dialysis ([place])   Acute respiratory failure with hypoxia and hypercapnia (HCC)   Septic shock (HCC)   Cardiac arrest [facility])   Palliative care by specialist   Palliative care encounter   Acute hypoxic respiratory failure from Rt lower lobe CAP complicated by Lt lower lobe HCAP with Serratia/septic shock: She was doing well yesterday but only morning, she went into acute respiratory failure again and chest x-ray showed volume overload/vascular congestion.  Nephrology was notified.  She was taken to HD early morning.  Continue Rocephin for total 10 days until 02/08/2020.  Relative adrenal insufficiency: Continue Florinef.  Blood pressure stable and improving however blood pressure has been dropping with hemodialysis.  ESRD/Hyperkalemia/volume overload: Received CRRT while intubated.  Nephrology on board.  Now on IHD.  Went into  acute respiratory failure with volume overload/acute pulmonary edema this morning.  Went for HD early morning.  I saw her on the HD.  She was on nonrebreather and tachypneic with shallow breathing.  Despite of this, she was alert and was able to tell me that she had no complaint.  On examination, she had basal crackles.  Blood pressure was also dropping due to hemodialysis.  Very complex situation where this lady is volume overloaded and needs more HD however her blood pressure does not support that.  She is at very high risk of rapid decline.  Currently partial code.  Palliative care on board and in talks with the family and highly recommends DNR which I fully agree with.  Will defer to them about initiating hospice conversation with the family if and when they agree with DNR.  Type B aortic dissection. - likely from trauma in setting of CPR - gao SBP < 120, HR < 70.  A fib with RVR with hx of chronic A fib present on admission/chronic diastolic CHF. On amiodarone currently.  Resumed Coumadin on 02/03/2020.  INR subtherapeutic.  Dosed by pharmacy.  Thrombocytopenia and anemia of chronic disease and critical illness: Hemoglobin and platelets are stable.  DM type 2: Controlled.  Continue SSI.  Hx of hypothyroidism. - continue synthroid  Dysphagia. - continue tube feeds through cortrak - f/u with speech therapy.  Plan for F EES.  Deconditioning. - PT/OT recommends SNF.  Goals of care: Palliative care on board.  DVT prophylaxis:    Code Status: Partial Code  Family Communication: No family present.  Patient seen in dialysis unit today.  Status is: Inpatient  Remains inpatient appropriate because:Inpatient level  of care appropriate due to severity of illness   Dispo: The patient is from: Home              Anticipated d/c is to: SNF              Anticipated d/c date is: 3 days              Patient currently is not medically stable to d/c.        Estimated body mass index  is 33.19 kg/m as calculated from the following:   Height as of this encounter: 5\' 4"  (1.626 m).   Weight as of this encounter: 87.7 kg.  Pressure Injury 02/04/20 Left skin tear on left cheek from ETT holder. (Active)  02/04/20 0800  Location:   Location Orientation: Left  Staging:   Wound Description (Comments): skin tear on left cheek from ETT holder.  Present on Admission:      Nutritional status:  Nutrition Problem: Inadequate oral intake Etiology: inability to eat   Signs/Symptoms: NPO status   Interventions: Tube feeding    Consultants:   Cardiothoracic surgery and nephrology  Procedures:   Intubation  Antimicrobials:  Anti-infectives (From admission, onward)   Start     Dose/Rate Route Frequency Ordered Stop   01/30/20 1345  cefTRIAXone (ROCEPHIN) 2 g in sodium chloride 0.9 % 100 mL IVPB        2 g 200 mL/hr over 30 Minutes Intravenous Every 24 hours 01/30/20 1245 02/03/20 1327   01/29/20 1500  vancomycin (VANCOCIN) IVPB 1000 mg/200 mL premix  Status:  Discontinued        1,000 mg 200 mL/hr over 60 Minutes Intravenous Every 24 hours 01/28/20 1000 01/29/20 1156   01/28/20 1100  vancomycin (VANCOREADY) IVPB 2000 mg/400 mL        2,000 mg 200 mL/hr over 120 Minutes Intravenous  Once 01/28/20 1000 01/28/20 1648   01/28/20 1100  meropenem (MERREM) 1 g in sodium chloride 0.9 % 100 mL IVPB  Status:  Discontinued        1 g 200 mL/hr over 30 Minutes Intravenous Every 8 hours 01/28/20 1000 01/30/20 1245   01/21/20 1800  ceFEPIme (MAXIPIME) 2 g in sodium chloride 0.9 % 100 mL IVPB        2 g 200 mL/hr over 30 Minutes Intravenous Every 12 hours 01/21/20 0803 01/27/20 0541   01/21/20 1200  vancomycin (VANCOCIN) IVPB 1000 mg/200 mL premix  Status:  Discontinued        1,000 mg 200 mL/hr over 60 Minutes Intravenous Every M-W-F (Hemodialysis) 01/06/2020 0904 01/20/20 0902   01/20/20 1800  ceFEPIme (MAXIPIME) 2 g in sodium chloride 0.9 % 100 mL IVPB  Status:  Discontinued         2 g 200 mL/hr over 30 Minutes Intravenous Every 12 hours 01/20/20 0941 01/21/20 0803   01/20/20 0600  ceFEPIme (MAXIPIME) 1 g in sodium chloride 0.9 % 100 mL IVPB  Status:  Discontinued        1 g 200 mL/hr over 30 Minutes Intravenous Every 24 hours 02/01/2020 0903 01/20/20 0941   01/09/2020 0545  ceFEPIme (MAXIPIME) 2 g in sodium chloride 0.9 % 100 mL IVPB        2 g 200 mL/hr over 30 Minutes Intravenous  Once 01/10/2020 0537 01/25/2020 0712   02/02/2020 0545  vancomycin (VANCOREADY) IVPB 2000 mg/400 mL        2,000 mg 200 mL/hr over 120 Minutes Intravenous  Once  01/20/2020 0541 02/04/2020 0802         Subjective: Patient seen and examined in dialysis unit.  She was on nonrebreather, tachypneic with shallow breathing but she was alert and denied any shortness of breath.  Objective: Vitals:   02/06/20 1030 02/06/20 1040 02/06/20 1151 02/06/20 1221  BP: (!) 98/42 (!) 99/33 (!) 82/40 (!) 103/42  Pulse: 96 94 85 88  Resp: (!) 25 (!) 27 (!) 22 (!) 25  Temp:  97.9 F (36.6 C) 97.9 F (36.6 C) 98 F (36.7 C)  TempSrc:   Axillary Axillary  SpO2:  100% 98% 98%  Weight:  87.7 kg    Height:        Intake/Output Summary (Last 24 hours) at 02/06/2020 1451 Last data filed at 02/06/2020 1232 Gross per 24 hour  Intake 2298.94 ml  Output 2000 ml  Net 298.94 ml   Filed Weights   02/06/20 0703 02/06/20 0708 02/06/20 1040  Weight: 91 kg 91 kg 87.7 kg    Examination:  General exam: Appears tachypneic Respiratory system: Tachypnea with bibasilar crackles.  Bilateral anterior rib cage tenderness. Cardiovascular system: S1 & S2 heard, irregularly irregular rate and rhythm. No JVD, murmurs, rubs, gallops or clicks. No pedal edema. Gastrointestinal system: Abdomen is nondistended, soft and nontender. No organomegaly or masses felt. Normal bowel sounds heard. Central nervous system: Alert but likely disoriented.  Unable to talk much due to having nonrebreather mask.  No focal deficit. Extremities:  Symmetric 5 x 5 power.  Data Reviewed: I have personally reviewed following labs and imaging studies  CBC: Recent Labs  Lab 02/02/20 0415 02/03/20 0409 02/04/20 0504 02/05/20 0602 02/06/20 0330  WBC 18.3* 13.7* 17.0* 17.0* 11.5*  NEUTROABS 15.6* 10.4* 14.1* 14.6* 8.8*  HGB 8.0* 7.8* 8.1* 8.2* 8.5*  HCT 25.8* 24.7* 26.4* 26.6* 27.5*  MCV 113.2* 114.4* 115.3* 115.2* 112.2*  PLT 142* 102* 143* 150 846   Basic Metabolic Panel: Recent Labs  Lab 01/31/20 0410 01/31/20 1732 02/01/20 0354 02/01/20 0354 02/01/20 1646 02/01/20 1646 02/02/20 0415 02/02/20 0415 02/02/20 1700 02/03/20 0409 02/04/20 0504 02/05/20 0602 02/06/20 0330  NA 135   < > 137   < > 137   < > 137   < > 138 137 139 138 139  K 3.3*   < > 3.8   < > 4.4   < > 4.5   < > 4.3 3.8 4.4 5.6* 4.3  CL 99   < > 101   < > 100   < > 101   < > 102 102 102 101 98  CO2 22   < > 24   < > 24   < > 23   < > 25 22 23  20* 25  GLUCOSE 199*   < > 214*   < > 172*   < > 176*   < > 137* 181* 160* 146* 199*  BUN 32*   < > 35*   < > 32*   < > 33*   < > 27* 30* 56* 90* 43*  CREATININE 1.52*   < > 1.70*   < > 1.54*   < > 1.58*   < > 1.34* 1.45* 3.13* 5.06* 3.29*  CALCIUM 10.5*   < > 10.2   < > 10.3   < > 10.1   < > 10.4* 10.2 11.0* 10.3 10.0  MG 2.7*  --  2.5*  --   --   --  2.5*  --   --  2.6*  --   --   --   PHOS 3.3   < > 3.1   < > 3.6  --  3.2  --  3.3 2.5 3.8  --   --    < > = values in this interval not displayed.   GFR: Estimated Creatinine Clearance: 14.6 mL/min (A) (by C-G formula based on SCr of 3.29 mg/dL (H)). Liver Function Tests: Recent Labs  Lab 02/01/20 1646 02/02/20 0415 02/02/20 1700 02/03/20 0409 02/04/20 0504  ALBUMIN 2.5* 2.6* 2.6* 2.3* 2.4*   No results for input(s): LIPASE, AMYLASE in the last 168 hours. No results for input(s): AMMONIA in the last 168 hours. Coagulation Profile: Recent Labs  Lab 02/02/20 0415 02/03/20 0409 02/04/20 0504 02/05/20 0602 02/06/20 0330  INR 1.1 1.2 1.1 1.2 1.2    Cardiac Enzymes: No results for input(s): CKTOTAL, CKMB, CKMBINDEX, TROPONINI in the last 168 hours. BNP (last 3 results) No results for input(s): PROBNP in the last 8760 hours. HbA1C: No results for input(s): HGBA1C in the last 72 hours. CBG: Recent Labs  Lab 02/05/20 1717 02/05/20 2016 02/06/20 0029 02/06/20 0456 02/06/20 1212  GLUCAP 155* 155* 151* 181* 168*   Lipid Profile: No results for input(s): CHOL, HDL, LDLCALC, TRIG, CHOLHDL, LDLDIRECT in the last 72 hours. Thyroid Function Tests: No results for input(s): TSH, T4TOTAL, FREET4, T3FREE, THYROIDAB in the last 72 hours. Anemia Panel: No results for input(s): VITAMINB12, FOLATE, FERRITIN, TIBC, IRON, RETICCTPCT in the last 72 hours. Sepsis Labs: No results for input(s): PROCALCITON, LATICACIDVEN in the last 168 hours.  Recent Results (from the past 240 hour(s))  Culture, blood (routine x 2)     Status: None   Collection Time: 01/28/20 11:43 AM   Specimen: BLOOD RIGHT ARM  Result Value Ref Range Status   Specimen Description BLOOD RIGHT ARM  Final   Special Requests   Final    BOTTLES DRAWN AEROBIC ONLY Blood Culture adequate volume   Culture   Final    NO GROWTH 5 DAYS Performed at [facility], [address] [place], [place] [address]    Report Status 02/02/2020 FINAL  Final  Culture, blood (routine x 2)     Status: None   Collection Time: 01/28/20 11:43 AM   Specimen: BLOOD RIGHT ARM  Result Value Ref Range Status   Specimen Description BLOOD RIGHT ARM  Final   Special Requests   Final    BOTTLES DRAWN AEROBIC ONLY Blood Culture adequate volume   Culture   Final    NO GROWTH 5 DAYS Performed at [facility], [place] [address] [place], [place] [address]    Report Status 02/02/2020 FINAL  Final  Culture, respiratory (non-expectorated)     Status: None   Collection Time: 01/28/20 11:49 AM   Specimen: Tracheal Aspirate; Respiratory  Result Value Ref Range Status   Specimen Description TRACHEAL  ASPIRATE  Final   Special Requests NONE  Final   Gram Stain   Final    RARE WBC PRESENT, PREDOMINANTLY PMN FEW GRAM NEGATIVE RODS Performed at [facility], [place] [address] [place], [place] [address]    Culture MODERATE SERRATIA MARCESCENS  Final   Report Status 01/30/2020 FINAL  Final   Organism ID, Bacteria SERRATIA MARCESCENS  Final      Susceptibility   Serratia marcescens - MIC*    CEFAZOLIN >=64 RESISTANT Resistant     CEFEPIME <=0.12 SENSITIVE Sensitive     CEFTAZIDIME <=1  SENSITIVE Sensitive     CEFTRIAXONE <=0.25 SENSITIVE Sensitive     CIPROFLOXACIN <=0.25 SENSITIVE Sensitive     GENTAMICIN <=1 SENSITIVE Sensitive     TRIMETH/SULFA <=20 SENSITIVE Sensitive     * MODERATE SERRATIA MARCESCENS      Radiology Studies: DG Chest Port 1 View  Result Date: 02/06/2020 CLINICAL DATA:  Acute respiratory distress, atrial fibrillation EXAM: PORTABLE CHEST 1 VIEW COMPARISON:  02/03/2020 FINDINGS: Single frontal view of the chest demonstrates stable left internal jugular catheter and enteric catheter. Cardiac silhouette is enlarged but stable. There are moderate bibasilar veiling opacities, left greater than right, compatible with effusions and/or consolidation. Moderate central vascular congestion, with diffuse interstitial prominence throughout the lungs. No pneumothorax. No acute bony abnormalities. IMPRESSION: 1. Worsening volume status, with central vascular congestion, interstitial prominence, and bilateral pleural effusions as above. 2. Stable enlarged cardiac silhouette. 3. Stable support devices. Electronically Signed   By: Randa Ngo M.D.   On: 02/06/2020 03:42    Scheduled Meds: . B-complex with vitamin C  1 tablet Per Tube Daily  . Chlorhexidine Gluconate Cloth  6 each Topical Q0600  . [START ON 02-08-2020] Chlorhexidine Gluconate Cloth  6 each Topical Q0600  . darbepoetin (ARANESP) injection - DIALYSIS  100 mcg Intravenous Q Wed-HD  . feeding supplement (PRO-STAT  SUGAR FREE 64)  30 mL Per Tube Daily  . fludrocortisone  0.1 mg Per Tube Daily  . hydrocortisone sod succinate (SOLU-CORTEF) inj  50 mg Intravenous Q8H  . insulin aspart  0-6 Units Subcutaneous Q4H  . levothyroxine  75 mcg Per Tube Q0600  . mouth rinse  15 mL Mouth Rinse BID  . metoprolol tartrate  25 mg Per Tube BID  . midodrine  10 mg Per Tube TID  . pantoprazole sodium  40 mg Per Tube Daily  . sodium chloride flush  10-40 mL Intracatheter Q12H  . warfarin  15 mg Oral ONCE-1600  . Warfarin - Pharmacist Dosing Inpatient   Does not apply q1600   Continuous Infusions: . sodium chloride Stopped (02/04/20 1059)  . amiodarone 30 mg/hr (02/06/20 1228)  . feeding supplement (VITAL 1.5 CAL) 50 mL/hr (02/04/20 2117)     LOS: 18 days   Time spent: 28 minutes   Darliss Cheney, MD [facility]  02/06/2020, 2:51 PM   To contact the attending provider between 7A-7P or the covering provider during after hours 7P-7A, please log into the web site [email or web address].

## 2020-02-06 NOTE — Social Work (Signed)
PT recommendation of SNF. Patient still has cortrak and tube feeds. Palliative following and meeting with family Saturday. TOC team will continue to follow and assist with discharge planning needs.   Criss Alvine, [place] Social Worker

## 2020-02-06 NOTE — Progress Notes (Signed)
ANTICOAGULATION CONSULT NOTE  Pharmacy Consult for warfarin Indication: atrial fibrillation  No Known Allergies  Patient Measurements: Height: 5\' 4"  (162.6 cm) Weight: 93.3 kg (205 lb 11 oz) IBW/kg (Calculated) : 54.7 Heparin Dosing Weight: 75.8 kg  Vital Signs: Temp: 98.8 F (37.1 C) (06/22 1121) Temp Source: Axillary (06/22 1121) BP: 83/58 (06/22 1215) Pulse Rate: 111 (06/22 1215)  Labs: Recent Labs    01/25/20 0231 01/25/20 1549 01/26/20 0331 01/26/20 1633 01/27/20 0308  HGB 7.8*  --  7.8*  --  8.1*  HCT 24.1*  --  25.1*  --  26.1*  PLT 142*  --  136*  --  126*  APTT 32  --  27  --  33  LABPROT 15.0  --  14.8  --  16.3*  INR 1.2  --  1.2  --  1.4*  CREATININE 1.61*   < > 1.66* 1.75* 1.74*   < > = values in this interval not displayed.    Estimated Creatinine Clearance: 28.5 mL/min (A) (by C-G formula based on SCr of 1.74 mg/dL (H)).   Assessment: 44 yof on warfarin PTA for history of afib admitted with respiratory distress with cardiac arrest on arrival to ER with ROSC.   Resumed warfarin 6/29, Prior to admit warfarin regimen is 10mg  daily except 5mg  on Fridays.   INR still 1.2 today. Hgb stable low 8s. Patient continues on amiodarone infusion.   Goal of Therapy:  INR 2-3 Monitor platelets by anticoagulation protocol: Yes   Plan:  Warfarin 15 mg x 1 in hopes of transitioning back to home dose over weekend.  Daily INR  Peniel Hass A. Levada Dy, PharmD, BCPS, FNKF Clinical Pharmacist [place] Please utilize Amion for appropriate phone number to reach the unit pharmacist ([place])   02/06/2020 7:41 AM

## 2020-02-06 NOTE — Progress Notes (Signed)
Floor coverage  Patient seen and examined at bedside.  Denies chest pain or shortness of breath.  Able to speak only in one-word sentences.  Respiratory rate in the mid 20s at rest but with just sitting up in the bed goes up to mid 30s.  No wheezing on auscultation of the lungs.  Chest x-ray showing worsening volume status with central vascular congestion, interstitial prominence, and bilateral pleural effusions.  She is ESRD on HD MWF.   -Stat ABG -BiPAP for increased work of breathing -Discussed with Dr. Justin Mend, patient will be taken for dialysis early this morning.

## 2020-02-06 NOTE — Progress Notes (Signed)
While rounding for follow up to prior call, Ms. Mencer was recently taken off BIPAP for med administration.  Pt is lethargic but responsive. Not interacting like at change of shift when she was on BIPAP and able to answer questions and alert. She is being placed back on BIPAP to rest over night.

## 2020-02-06 NOTE — Progress Notes (Signed)
SLP Cancellation Note  Patient Details Name: Sabrina Beck MRN: [id number] DOB: 09-14-1940   Cancelled treatment:       Reason Eval/Treat Not Completed: Patient not medically ready. Given events of the night, decline needing BiPAP and second HD session today, will again defer FEES swallow eval. Pt is not ready to attempt assessment at this time. Will f/u on Monday for improvement.   Herbie Baltimore, MA [place]  Acute Rehabilitation Services Pager [phone] Office [phone]  Lynann Beaver 02/06/2020, 7:36 AM

## 2020-02-06 NOTE — Progress Notes (Signed)
Patient with increasing work of breathing and tachypnea. At this time, patient is stating that she feels short of breath. O2 sats 88-92% on 4LNC. PRN order for BiPAP in place. Rapid response RN came to bedside; patient was placed on BiPAP by rapid response RN. Tube feeds were stopped at this time. Respiratory therapy aware of patient status. Patient's sons are at the bedside and have been updated.

## 2020-02-06 NOTE — Significant Event (Signed)
Rapid Response Event Note  Overview: Respiratory Distress   Initial Focused Assessment: Called to the bedside for patient experiencing increased shortness of breath and generalized fatigue/increased WOB. Upon arrival, patient was drowsy and appears tired, she endorses being quite short of breath and she feels like she is struggling. Lung sounds - coarse crackles in the bases - 94 % on [place] 4L with a RR of 30-35, moderate accessory muscle use. BP 110/51(68) and HR of 80.   Her sons were present, we discussed that we turned off her TF via her cortrak and flushed the cortrak to help reduce the aspiration risk. Patient can lift her head off the bed and is able to lift her hand up and reach her face as well. We discussed that there is an aspiration risk whenever BIPAP is being utilized, they understood. We discussed that if the patient deteriorates, they would be okay with her being intubated given her current code status. They said they were still discussing this matter.   Interventions:  -- BIPAP 50% 16/8 -- initially she did not tolerate the BIPAP but after a few minutes - patient endorsed she was breathing better and was no longer short of breath.   Plan of Care: -- Monitor VS and respiratory status -- Wean BIPAP if possible    Event Summary:  Call Time 1830 Arrival Time [place] End Time 1910   Blakely Maranan R

## 2020-02-06 NOTE — Progress Notes (Signed)
   02/06/20 0039  Assess: MEWS Score  Temp 98.5 F (36.9 C)  BP (!) 133/45  Pulse Rate 67  ECG Heart Rate 76  Resp (!) 26  SpO2 100 %  O2 Device Nasal Cannula  O2 Flow Rate (L/min) 2 L/min  Assess: MEWS Score  MEWS Temp 0  MEWS Systolic 0  MEWS Pulse 0  MEWS RR 2  MEWS LOC 0  MEWS Score 2  MEWS Score Color Yellow  Assess: if the MEWS score is Yellow or Red  Were vital signs taken at a resting state? Yes  Focused Assessment Documented focused assessment  Early Detection of Sepsis Score *See Row Information* Medium  MEWS guidelines implemented *See Row Information* Yes  Treat  MEWS Interventions Escalated (See documentation below)  Take Vital Signs  Increase Vital Sign Frequency  Yellow: Q 2hr X 2 then Q 4hr X 2, if remains yellow, continue Q 4hrs  Escalate  MEWS: Escalate Yellow: discuss with charge nurse/RN and consider discussing with provider and RRT  Notify: Charge Nurse/RN  Name of Charge Nurse/RN Notified Neoma Laming   Date Charge Nurse/RN Notified 02/06/20  Time Charge Nurse/RN Notified 0045  Notify: Provider  Provider Name/Title Rathore MD  Date Provider Notified 02/06/20  Time Provider Notified 0345  Notification Type Page  Notification Reason Change in status  Response See new orders  Date of Provider Response 02/06/20  Time of Provider Response 0345  Notify: Rapid Response  Name of Rapid Response RN Notified Mindy  Date Rapid Response Notified 02/06/20  Time Rapid Response Notified 0308  Document  Patient Outcome Other (Comment) (pt placed on BiPap)  Progress note created (see row info) Yes

## 2020-02-06 NOTE — Significant Event (Signed)
Rapid Response Event Note  Overview: Called d/t increased WOB.   Initial Focused Assessment: Pt laying in bed with eyes open, alert, oriented to self. Pt tachypneic with +WOB. Pt says she feels SOB "sometimes," however, gets winded with one-word responses to questions. Pt denies chest pain. L lungs clear with some crackles in the upper lobe and diminished in the base, R lungs diminished t/o all lung fields. Murmur present.  Skin warm and dry. T-97.5, HR-80, BP-135/46, RR-28, SpO2-95% on 2L [place].  Interventions: PCXR-worsening volume status, bilateral pleural effusions  Plan of Care (if not transferred): Relay situation and PCXR results to MD. Pt's SpO2 is ok at this time on 2L [place]. Pt is ESRD on HD. Plans already in place for HD this morning. Continue to monitor pt. Call RRT if further assistance needed.  Event Summary:  Called: 0308(asked to see pt while RRT on unit Arrived: 0308 Ended: 0345  Dillard [place]

## 2020-02-07 DIAGNOSIS — Z515 Encounter for palliative care: Secondary | ICD-10-CM

## 2020-02-07 LAB — RENAL FUNCTION PANEL
Albumin: 2.7 g/dL — ABNORMAL LOW (ref 3.5–5.0)
Anion gap: 18 — ABNORMAL HIGH (ref 5–15)
BUN: 40 mg/dL — ABNORMAL HIGH (ref 8–23)
CO2: 25 mmol/L (ref 22–32)
Calcium: 10.4 mg/dL — ABNORMAL HIGH (ref 8.9–10.3)
Chloride: 94 mmol/L — ABNORMAL LOW (ref 98–111)
Creatinine, Ser: 3.08 mg/dL — ABNORMAL HIGH (ref 0.44–1.00)
GFR calc Af Amer: 16 mL/min — ABNORMAL LOW (ref >60)
GFR calc non Af Amer: 14 mL/min — ABNORMAL LOW (ref >60)
Glucose, Bld: 140 mg/dL — ABNORMAL HIGH (ref 70–99)
Phosphorus: 7.7 mg/dL — ABNORMAL HIGH (ref 2.5–4.6)
Potassium: 4 mmol/L (ref 3.5–5.1)
Sodium: 137 mmol/L (ref 135–145)

## 2020-02-07 LAB — GLUCOSE, CAPILLARY: Glucose-Capillary: 126 mg/dL — ABNORMAL HIGH (ref 70–99)

## 2020-02-07 LAB — PROTIME-INR
INR: 1.5 — ABNORMAL HIGH (ref 0.8–1.2)
Prothrombin Time: 17.4 seconds — ABNORMAL HIGH (ref 11.4–15.2)

## 2020-02-07 LAB — CBC WITH DIFFERENTIAL/PLATELET
Abs Immature Granulocytes: 0.09 10*3/uL — ABNORMAL HIGH (ref 0.00–0.07)
Basophils Absolute: 0 10*3/uL (ref 0.0–0.1)
Basophils Relative: 0 %
Eosinophils Absolute: 0 10*3/uL (ref 0.0–0.5)
Eosinophils Relative: 0 %
HCT: 30.5 % — ABNORMAL LOW (ref 36.0–46.0)
Hemoglobin: 9.4 g/dL — ABNORMAL LOW (ref 12.0–15.0)
Immature Granulocytes: 1 %
Lymphocytes Relative: 17 %
Lymphs Abs: 2.1 10*3/uL (ref 0.7–4.0)
MCH: 34.8 pg — ABNORMAL HIGH (ref 26.0–34.0)
MCHC: 30.8 g/dL (ref 30.0–36.0)
MCV: 113 fL — ABNORMAL HIGH (ref 80.0–100.0)
Monocytes Absolute: 1.1 10*3/uL — ABNORMAL HIGH (ref 0.1–1.0)
Monocytes Relative: 9 %
Neutro Abs: 9 10*3/uL — ABNORMAL HIGH (ref 1.7–7.7)
Neutrophils Relative %: 73 %
Platelets: 191 10*3/uL (ref 150–400)
RBC: 2.7 MIL/uL — ABNORMAL LOW (ref 3.87–5.11)
RDW: 20.7 % — ABNORMAL HIGH (ref 11.5–15.5)
WBC Morphology: >10
WBC: 12.3 10*3/uL — ABNORMAL HIGH (ref 4.0–10.5)
nRBC: 0.2 % (ref 0.0–0.2)

## 2020-02-07 MED ORDER — LORAZEPAM 2 MG/ML PO CONC: 1.0000 mg | ORAL | Status: DC | PRN

## 2020-02-07 MED ORDER — POLYVINYL ALCOHOL 1.4 % OP SOLN: 1.0000 [drp] | Freq: Four times a day (QID) | OPHTHALMIC | Status: DC | PRN

## 2020-02-07 MED ORDER — HYDROMORPHONE BOLUS VIA INFUSION
0.3000 mg | INTRAVENOUS | Status: DC | PRN
  Administered 2020-02-07 (×4): 0.5 mg via INTRAVENOUS
  Filled 2020-02-07: qty 1

## 2020-02-07 MED ORDER — GLYCOPYRROLATE 0.2 MG/ML IJ SOLN
0.4000 mg | Freq: Two times a day (BID) | INTRAMUSCULAR | Status: DC
  Administered 2020-02-07: 0.2 mg via INTRAVENOUS

## 2020-02-07 MED ORDER — GLYCOPYRROLATE 0.2 MG/ML IJ SOLN
0.2000 mg | INTRAMUSCULAR | Status: DC | PRN
  Administered 2020-02-07: 0.2 mg via INTRAVENOUS
  Filled 2020-02-07 (×2): qty 1

## 2020-02-07 MED ORDER — ONDANSETRON 4 MG PO TBDP
4.0000 mg | ORAL_TABLET | Freq: Four times a day (QID) | ORAL | Status: DC | PRN
  Filled 2020-02-07: qty 1

## 2020-02-07 MED ORDER — ALBUMIN HUMAN 25 % IV SOLN: 12.5000 g | Freq: Once | INTRAVENOUS | Status: DC

## 2020-02-07 MED ORDER — LORAZEPAM 2 MG/ML IJ SOLN
0.2500 mg | Freq: Two times a day (BID) | INTRAMUSCULAR | Status: DC
  Administered 2020-02-07: 0.25 mg via INTRAVENOUS
  Filled 2020-02-07: qty 1

## 2020-02-07 MED ORDER — ONDANSETRON HCL 4 MG/2ML IJ SOLN: 4.0000 mg | Freq: Four times a day (QID) | INTRAMUSCULAR | Status: DC | PRN

## 2020-02-07 MED ORDER — HALOPERIDOL 0.5 MG PO TABS
0.5000 mg | ORAL_TABLET | ORAL | Status: DC | PRN
  Filled 2020-02-07: qty 1

## 2020-02-07 MED ORDER — SODIUM CHLORIDE 0.9 % IV SOLN
0.5000 mg/h | INTRAVENOUS | Status: DC
  Administered 2020-02-07: 0.25 mg/h via INTRAVENOUS
  Filled 2020-02-07: qty 5

## 2020-02-07 MED ORDER — ACETAMINOPHEN 325 MG PO TABS: 650.0000 mg | ORAL_TABLET | Freq: Four times a day (QID) | ORAL | Status: DC | PRN

## 2020-02-07 MED ORDER — ALBUMIN HUMAN 25 % IV SOLN
INTRAVENOUS | Status: AC
  Administered 2020-02-07: 0.25 mL
  Filled 2020-02-07: qty 50

## 2020-02-07 MED ORDER — GLYCOPYRROLATE 0.2 MG/ML IJ SOLN: 0.2000 mg | INTRAMUSCULAR | Status: DC | PRN

## 2020-02-07 MED ORDER — BIOTENE DRY MOUTH MT LIQD: 15.0000 mL | OROMUCOSAL | Status: DC | PRN

## 2020-02-07 MED ORDER — HYDROMORPHONE HCL 1 MG/ML IJ SOLN: 0.2500 mg | INTRAMUSCULAR | Status: DC | PRN

## 2020-02-07 MED ORDER — ACETAMINOPHEN 650 MG RE SUPP: 650.0000 mg | Freq: Four times a day (QID) | RECTAL | Status: DC | PRN

## 2020-02-07 MED ORDER — LORAZEPAM 2 MG/ML IJ SOLN: 1.0000 mg | INTRAMUSCULAR | Status: DC | PRN

## 2020-02-07 MED ORDER — LORAZEPAM 2 MG/ML IJ SOLN: 0.2500 mg | Freq: Two times a day (BID) | INTRAMUSCULAR | Status: DC

## 2020-02-07 MED ORDER — HALOPERIDOL LACTATE 2 MG/ML PO CONC
0.5000 mg | ORAL | Status: DC | PRN
  Filled 2020-02-07: qty 0.3

## 2020-02-07 MED ORDER — GLYCOPYRROLATE 0.2 MG/ML IJ SOLN
0.4000 mg | Freq: Four times a day (QID) | INTRAMUSCULAR | Status: DC
  Administered 2020-02-07: 0.4 mg via INTRAVENOUS
  Filled 2020-02-07: qty 2

## 2020-02-07 MED ORDER — LORAZEPAM 1 MG PO TABS: 1.0000 mg | ORAL_TABLET | ORAL | Status: DC | PRN

## 2020-02-07 MED ORDER — GLYCOPYRROLATE 1 MG PO TABS
1.0000 mg | ORAL_TABLET | ORAL | Status: DC | PRN
  Filled 2020-02-07: qty 1

## 2020-02-07 MED ORDER — HYDROMORPHONE BOLUS VIA INFUSION
0.5000 mg | INTRAVENOUS | Status: DC | PRN
  Administered 2020-02-07 (×2): 1 mg via INTRAVENOUS
  Filled 2020-02-07: qty 1

## 2020-02-07 MED ORDER — HALOPERIDOL LACTATE 5 MG/ML IJ SOLN: 0.5000 mg | INTRAMUSCULAR | Status: DC | PRN

## 2020-02-13 DIAGNOSIS — I5032 Chronic diastolic (congestive) heart failure: Secondary | ICD-10-CM | POA: Diagnosis not present

## 2020-02-13 DIAGNOSIS — J9601 Acute respiratory failure with hypoxia: Secondary | ICD-10-CM | POA: Diagnosis not present

## 2020-02-13 DIAGNOSIS — I5042 Chronic combined systolic (congestive) and diastolic (congestive) heart failure: Secondary | ICD-10-CM | POA: Diagnosis not present

## 2020-03-07 NOTE — Progress Notes (Signed)
PROGRESS NOTE    Sabrina Beck  MBW:[id number] DOB: 1941-02-19 DOA: 01/29/2020 PCP: Redmond School, MD   Brief Narrative: Patient is a 79 year old female with history of A. fib on Coumadin, ESRD on hemodialysis Monday Wednesday Friday, diastolic congestive heart failure, diverticulitis, GERD, hyperlipidemia, neuropathy, hypertension, uterine cancer who initially presented to [facility] on 6/14 with respiratory distress from pneumonia leading to cardiac arrest.  She was transferred to [facility].  She was intubated and was admitted under ICU service.  She failed extubation attempt on 01/25/2020 requiring intubation and subsequently extubated on 01/25/2020.CT angiogram chest, abdomen and pelvis on 01/28/2020 showed type II aortic dissection, small pericardial effusion and small left hemothorax.  Seen by cardiothoracic surgery who recommended medical management.  Echo on 01/28/2020 showed ejection fraction 50 to 55%, mild LVH, grade 2 DD, moderate LA dilatation.  Hospital course remarkable for persistent respiratory failure.  Currently on 100% nonrebreather.  Also on feeding tube.  Palliative care closely following for goals of care.  Made DNR now. She is being dialyzed by nephrology.  Assessment & Plan:   Active Problems:   ESRD (end stage renal disease) on dialysis ([place])   Acute respiratory failure with hypoxia and hypercapnia (HCC)   Septic shock (HCC)   Cardiac arrest [facility])   Palliative care by specialist   Palliative care encounter   Acute hypoxic respiratory failure: Secondary to right lower lobe pneumonia complicated by left lower lobe HCAP with Serratia.  Hospital course was remarkable for persistent respiratory failure requiring BiPAP, 100% nonrebreather.  Chest x-ray showed volume overload/vascular congestion.  Rapid response called early this morning for respiratory distress.  She is being dialyzed today.  Cardiac arrest: Resuscitated in the ED.  She developed type B  aortic dissection, felt to be secondary to CPR.  Seen by the CTS and recommended medical management.Echo on 01/28/2020 showed ejection fraction 50 to 55%, mild LVH, grade 2 DD, moderate LA dilatation.  Dysphagia: Currently on NG tube feeding  ESRD/hyperkalemia/volume overload: She was on CRRT after being intubated.  Nephrology following.  Now on intermittent dialysis.  Went into acute respiratory distress this morning and being dialyzed today.  She has a temporary dialysis catheter on the left side of the neck  Hypotension: Becomes hypotensive during dialysis session.  Continue Florinef.  She is also on  midodrine  A. fib with RVR: History of chronic A. fib.  On amiodarone.  On Coumadin for anticoagulation.  Currently rate is controlled.  On metoprolol for rate control  Chronic diastolic  congestive heart failure: Volume management by dialysis.  Thrombocytopenia/normocytic anemia: Associated with chronic medical condition.  Continue to monitor  Type 2 diabetes mellitus: Continue sliding Zelson  Hypothyroidism: On Synthyroid  Deconditioning/debility: Seen by PT/OT and recommended skilled facility  Goals of care: Very poor prognosis.  Advanced age. dialysis patient.  Now DNR.  She is in persistent respiratory status.  I think she is a candidate for residential hospice.    Nutrition Problem: Inadequate oral intake Etiology: inability to eat      DVT prophylaxis:Coumadin Code Status: DNR Family Communication: None present at the bedside Status is: Inpatient  Remains inpatient appropriate because:Hemodynamically unstable   Dispo: The patient is from: Home              Anticipated d/c is to: SNF vs residential Hospice              Anticipated d/c date is: > 3 days  Patient currently is not medically stable to d/c. Ongoing discussion with family regarding her goals of care.  Consultants: PCCM, nephrology, TCTs  Procedures: Dialysis  Antimicrobials:    Anti-infectives (From admission, onward)   Start     Dose/Rate Route Frequency Ordered Stop   01/30/20 1345  cefTRIAXone (ROCEPHIN) 2 g in sodium chloride 0.9 % 100 mL IVPB        2 g 200 mL/hr over 30 Minutes Intravenous Every 24 hours 01/30/20 1245 02/03/20 1327   01/29/20 1500  vancomycin (VANCOCIN) IVPB 1000 mg/200 mL premix  Status:  Discontinued        1,000 mg 200 mL/hr over 60 Minutes Intravenous Every 24 hours 01/28/20 1000 01/29/20 1156   01/28/20 1100  vancomycin (VANCOREADY) IVPB 2000 mg/400 mL        2,000 mg 200 mL/hr over 120 Minutes Intravenous  Once 01/28/20 1000 01/28/20 1648   01/28/20 1100  meropenem (MERREM) 1 g in sodium chloride 0.9 % 100 mL IVPB  Status:  Discontinued        1 g 200 mL/hr over 30 Minutes Intravenous Every 8 hours 01/28/20 1000 01/30/20 1245   01/21/20 1800  ceFEPIme (MAXIPIME) 2 g in sodium chloride 0.9 % 100 mL IVPB        2 g 200 mL/hr over 30 Minutes Intravenous Every 12 hours 01/21/20 0803 01/27/20 0541   01/21/20 1200  vancomycin (VANCOCIN) IVPB 1000 mg/200 mL premix  Status:  Discontinued        1,000 mg 200 mL/hr over 60 Minutes Intravenous Every M-W-F (Hemodialysis) 01/15/2020 0904 01/20/20 0902   01/20/20 1800  ceFEPIme (MAXIPIME) 2 g in sodium chloride 0.9 % 100 mL IVPB  Status:  Discontinued        2 g 200 mL/hr over 30 Minutes Intravenous Every 12 hours 01/20/20 0941 01/21/20 0803   01/20/20 0600  ceFEPIme (MAXIPIME) 1 g in sodium chloride 0.9 % 100 mL IVPB  Status:  Discontinued        1 g 200 mL/hr over 30 Minutes Intravenous Every 24 hours 01/23/2020 0903 01/20/20 0941   01/16/2020 0545  ceFEPIme (MAXIPIME) 2 g in sodium chloride 0.9 % 100 mL IVPB        2 g 200 mL/hr over 30 Minutes Intravenous  Once 01/20/2020 0537 01/28/2020 0712   01/24/2020 0545  vancomycin (VANCOREADY) IVPB 2000 mg/400 mL        2,000 mg 200 mL/hr over 120 Minutes Intravenous  Once 02/04/2020 0541 02/02/2020 0802      Subjective: Patient seen and examined at the  bedside this morning the dialysis suite.  Currently being dialyzed.  Very deconditioned, debilitated, very sick looking.  She was in severe respiratory distress.  On 100% nonrebreather.  Objective: Vitals:   Mar 03, 2020 0800 03/03/20 0830 03-03-20 0900 2020/03/03 0930  BP: (!) 94/44 (!) 86/34 (!) 86/37 (!) 87/37  Pulse: 80 82 93 85  Resp: (!) 27 (!) 25 (!) 26 (!) 30  Temp:      TempSrc:      SpO2: 100%     Weight:      Height:        Intake/Output Summary (Last 24 hours) at 03/03/2020 1009 Last data filed at 02/06/2020 1232 Gross per 24 hour  Intake 200 ml  Output 1550 ml  Net -1350 ml   Filed Weights   02/06/20 1040 03/03/2020 0431 03-03-2020 0658  Weight: 87.7 kg 87 kg 87.9 kg    Examination:  General  exam: Very deconditioned, debilitated, very sick looking. Respiratory system: Bilateral decreased air entry, bilateral crackles  cardiovascular system: Afib,No pedal edema. Gastrointestinal system: Abdomen is nondistended, soft and nontender. No organomegaly or masses felt. Normal bowel sounds heard.feeding tube Central nervous system: Alert and awake Extremities: No edema, no clubbing ,no cyanosis Skin: No rashes, lesions or ulcers,no icterus ,no pallor   Data Reviewed: I have personally reviewed following labs and imaging studies  CBC: Recent Labs  Lab 02/03/20 0409 02/04/20 0504 02/05/20 0602 02/06/20 0330 03/08/2020 0500  WBC 13.7* 17.0* 17.0* 11.5* 12.3*  NEUTROABS 10.4* 14.1* 14.6* 8.8* 9.0*  HGB 7.8* 8.1* 8.2* 8.5* 9.4*  HCT 24.7* 26.4* 26.6* 27.5* 30.5*  MCV 114.4* 115.3* 115.2* 112.2* 113.0*  PLT 102* 143* 150 163 412   Basic Metabolic Panel: Recent Labs  Lab 02/01/20 0354 02/01/20 1646 02/02/20 0415 02/02/20 0415 02/02/20 1700 02/02/20 1700 02/03/20 0409 02/04/20 0504 02/05/20 0602 02/06/20 0330 Mar 08, 2020 0615  NA 137   < > 137   < > 138   < > 137 139 138 139 137  K 3.8   < > 4.5   < > 4.3   < > 3.8 4.4 5.6* 4.3 4.0  CL 101   < > 101   < > 102   < > 102  102 101 98 94*  CO2 24   < > 23   < > 25   < > 22 23 20* 25 25  GLUCOSE 214*   < > 176*   < > 137*   < > 181* 160* 146* 199* 140*  BUN 35*   < > 33*   < > 27*   < > 30* 56* 90* 43* 40*  CREATININE 1.70*   < > 1.58*   < > 1.34*   < > 1.45* 3.13* 5.06* 3.29* 3.08*  CALCIUM 10.2   < > 10.1   < > 10.4*   < > 10.2 11.0* 10.3 10.0 10.4*  MG 2.5*  --  2.5*  --   --   --  2.6*  --   --   --   --   PHOS 3.1   < > 3.2  --  3.3  --  2.5 3.8  --   --  7.7*   < > = values in this interval not displayed.   GFR: Estimated Creatinine Clearance: 15.6 mL/min (A) (by C-G formula based on SCr of 3.08 mg/dL (H)). Liver Function Tests: Recent Labs  Lab 02/02/20 0415 02/02/20 1700 02/03/20 0409 02/04/20 0504 03-08-2020 0615  ALBUMIN 2.6* 2.6* 2.3* 2.4* 2.7*   No results for input(s): LIPASE, AMYLASE in the last 168 hours. No results for input(s): AMMONIA in the last 168 hours. Coagulation Profile: Recent Labs  Lab 02/03/20 0409 02/04/20 0504 02/05/20 0602 02/06/20 0330 Mar 08, 2020 0500  INR 1.2 1.1 1.2 1.2 1.5*   Cardiac Enzymes: No results for input(s): CKTOTAL, CKMB, CKMBINDEX, TROPONINI in the last 168 hours. BNP (last 3 results) No results for input(s): PROBNP in the last 8760 hours. HbA1C: No results for input(s): HGBA1C in the last 72 hours. CBG: Recent Labs  Lab 02/06/20 1212 02/06/20 1618 02/06/20 2041 02/06/20 2354 2020/03/08 0510  GLUCAP 168* 148* 140* 159* 126*   Lipid Profile: No results for input(s): CHOL, HDL, LDLCALC, TRIG, CHOLHDL, LDLDIRECT in the last 72 hours. Thyroid Function Tests: No results for input(s): TSH, T4TOTAL, FREET4, T3FREE, THYROIDAB in the last 72 hours. Anemia Panel: No results for input(s): VITAMINB12,  FOLATE, FERRITIN, TIBC, IRON, RETICCTPCT in the last 72 hours. Sepsis Labs: No results for input(s): PROCALCITON, LATICACIDVEN in the last 168 hours.  Recent Results (from the past 240 hour(s))  Culture, blood (routine x 2)     Status: None    Collection Time: 01/28/20 11:43 AM   Specimen: BLOOD RIGHT ARM  Result Value Ref Range Status   Specimen Description BLOOD RIGHT ARM  Final   Special Requests   Final    BOTTLES DRAWN AEROBIC ONLY Blood Culture adequate volume   Culture   Final    NO GROWTH 5 DAYS Performed at [facility], [address], [place], [place] [address]    Report Status 02/02/2020 FINAL  Final  Culture, blood (routine x 2)     Status: None   Collection Time: 01/28/20 11:43 AM   Specimen: BLOOD RIGHT ARM  Result Value Ref Range Status   Specimen Description BLOOD RIGHT ARM  Final   Special Requests   Final    BOTTLES DRAWN AEROBIC ONLY Blood Culture adequate volume   Culture   Final    NO GROWTH 5 DAYS Performed at [facility], [place] [address], [place], [place] [address]    Report Status 02/02/2020 FINAL  Final  Culture, respiratory (non-expectorated)     Status: None   Collection Time: 01/28/20 11:49 AM   Specimen: Tracheal Aspirate; Respiratory  Result Value Ref Range Status   Specimen Description TRACHEAL ASPIRATE  Final   Special Requests NONE  Final   Gram Stain   Final    RARE WBC PRESENT, PREDOMINANTLY PMN FEW GRAM NEGATIVE RODS Performed at [facility], [place] [address] [place], [place] [address]    Culture MODERATE SERRATIA MARCESCENS  Final   Report Status 01/30/2020 FINAL  Final   Organism ID, Bacteria SERRATIA MARCESCENS  Final      Susceptibility   Serratia marcescens - MIC*    CEFAZOLIN >=64 RESISTANT Resistant     CEFEPIME <=0.12 SENSITIVE Sensitive     CEFTAZIDIME <=1 SENSITIVE Sensitive     CEFTRIAXONE <=0.25 SENSITIVE Sensitive     CIPROFLOXACIN <=0.25 SENSITIVE Sensitive     GENTAMICIN <=1 SENSITIVE Sensitive     TRIMETH/SULFA <=20 SENSITIVE Sensitive     * MODERATE SERRATIA MARCESCENS         Radiology Studies: DG Chest Port 1 View  Result Date: 02/06/2020 CLINICAL DATA:  Acute respiratory distress, atrial fibrillation EXAM: PORTABLE CHEST 1 VIEW  COMPARISON:  02/03/2020 FINDINGS: Single frontal view of the chest demonstrates stable left internal jugular catheter and enteric catheter. Cardiac silhouette is enlarged but stable. There are moderate bibasilar veiling opacities, left greater than right, compatible with effusions and/or consolidation. Moderate central vascular congestion, with diffuse interstitial prominence throughout the lungs. No pneumothorax. No acute bony abnormalities. IMPRESSION: 1. Worsening volume status, with central vascular congestion, interstitial prominence, and bilateral pleural effusions as above. 2. Stable enlarged cardiac silhouette. 3. Stable support devices. Electronically Signed   By: Randa Ngo M.D.   On: 02/06/2020 03:42        Scheduled Meds: . B-complex with vitamin C  1 tablet Per Tube Daily  . Chlorhexidine Gluconate Cloth  6 each Topical Q0600  . Chlorhexidine Gluconate Cloth  6 each Topical Q0600  . darbepoetin (ARANESP) injection - DIALYSIS  100 mcg Intravenous Q Wed-HD  . feeding supplement (PRO-STAT SUGAR FREE 64)  30 mL Per Tube Daily  . fludrocortisone  0.1 mg Per Tube  Daily  . hydrocortisone sod succinate (SOLU-CORTEF) inj  50 mg Intravenous Q8H  . insulin aspart  0-6 Units Subcutaneous Q4H  . levothyroxine  75 mcg Per Tube Q0600  . mouth rinse  15 mL Mouth Rinse BID  . metoprolol tartrate  25 mg Per Tube BID  . midodrine  10 mg Per Tube TID  . pantoprazole sodium  40 mg Per Tube Daily  . sodium chloride flush  10-40 mL Intracatheter Q12H  . Warfarin - Pharmacist Dosing Inpatient   Does not apply q1600   Continuous Infusions: . sodium chloride Stopped (02/04/20 1059)  . albumin human    . amiodarone 30 mg/hr (02/06/20 1228)  . feeding supplement (VITAL 1.5 CAL) Stopped (02/06/20 1830)     LOS: 19 days    Time spent:35 mins. More than 50% of that time was spent in counseling and/or coordination of care.      Shelly Coss, MD [facility] P2021/07/28, 10:09 AM

## 2020-03-07 NOTE — Progress Notes (Signed)
Patient expired at 1618. Pronounced by Kerrie Buffalo RN and Nona Dell RN. Patient's sons [name] and [name] were at bedside with patient. Notified Dr. Tawanna Solo and palliative care team.

## 2020-03-07 NOTE — Death Summary Note (Signed)
Death Summary  Sabrina Beck ZOX:[id number] DOB: 1941/02/26 DOA: 02-15-2020  PCP: Redmond School, MD  Admit date: 02/15/20 Date of Death: 2020/03/05 Time of Death: 1616/11/23  History of present illness:   Patient is a 79 year old female with history of A. fib on Coumadin, ESRD on hemodialysis Monday Wednesday Friday, diastolic congestive heart failure, diverticulitis, GERD, hyperlipidemia, neuropathy, hypertension, uterine cancer who initially presented to [facility] on 02/15/2023 with respiratory distress from pneumonia leading to cardiac arrest.  She was transferred to [facility].  She was intubated and was admitted under ICU service.  She failed extubation attempt on 01/25/2020 requiring intubation and subsequently extubated on 01/25/2020.CT angiogram chest, abdomen and pelvis on 01/28/2020 showed type II aortic dissection, small pericardial effusion and small left hemothorax. Seen by cardiothoracic surgery who recommended medical management. Echo on 01/28/2020 showed ejection fraction 50 to 55%, mild LVH, grade 2 DD, moderate LA dilatation.  She was also  started  on feeding tube.  Palliative care closely following for goals of care. She was being dialyzed by nephrology.  Hospital course remarkable for persistent respiratory failure requiring 100% nonrebreather.  After extensive discussion with the family, they were interested on comfort care.  She was started on full comfort care.  She expired on March 05, 2020   Final Diagnoses:  1.   Acute respiratory failure from pneumonia    The results of significant diagnostics from this hospitalization (including imaging, microbiology, ancillary and laboratory) are listed below for reference.    Significant Diagnostic Studies: CT HEAD WO CONTRAST  Result Date: 02-15-20 CLINICAL DATA:  Status post cardiac arrest. EXAM: CT HEAD WITHOUT CONTRAST TECHNIQUE: Contiguous axial images were obtained from the base of the skull through the vertex without  intravenous contrast. COMPARISON:  Head CT scan 07/04/2019. FINDINGS: Brain: No evidence of acute infarction, hemorrhage, hydrocephalus, extra-axial collection or mass lesion/mass effect. Cortical atrophy again seen. Vascular: No hyperdense vessel or unexpected calcification. Skull: Intact.  No focal lesion. Sinuses/Orbits: Status post cataract surgery.  Otherwise negative. Other: None. IMPRESSION: No acute abnormality. Atrophy. Electronically Signed   By: Inge Rise M.D.   On: 02-15-2020 09:13   DG Chest Port 1 View  Result Date: 02/06/2020 CLINICAL DATA:  Acute respiratory distress, atrial fibrillation EXAM: PORTABLE CHEST 1 VIEW COMPARISON:  02/03/2020 FINDINGS: Single frontal view of the chest demonstrates stable left internal jugular catheter and enteric catheter. Cardiac silhouette is enlarged but stable. There are moderate bibasilar veiling opacities, left greater than right, compatible with effusions and/or consolidation. Moderate central vascular congestion, with diffuse interstitial prominence throughout the lungs. No pneumothorax. No acute bony abnormalities. IMPRESSION: 1. Worsening volume status, with central vascular congestion, interstitial prominence, and bilateral pleural effusions as above. 2. Stable enlarged cardiac silhouette. 3. Stable support devices. Electronically Signed   By: Randa Ngo M.D.   On: 02/06/2020 03:42   DG Chest Port 1 View  Result Date: 02/03/2020 CLINICAL DATA:  Pneumonia. EXAM: PORTABLE CHEST 1 VIEW COMPARISON:  CT 01/28/2020. FINDINGS: Interim removal of endotracheal tube. Interim removal of NG tube and placement of a feeding tube, its tip is below left hemidiaphragm. Cardiomegaly. No pulmonary venous congestion. Low lung volumes with bibasilar. Left base infiltrate. Small left pleural effusion again noted. No pneumothorax. IMPRESSION: 1. Interim removal of endotracheal tube. Interim removal of NG tube and placement of feeding tube, its tip is below left  hemidiaphragm. 2.  Cardiomegaly.  No pulmonary venous congestion. 3. Low lung volumes with bibasilar atelectasis. Left base infiltrate and small  left pleural effusion again noted. Electronically Signed   By: Marcello Moores  Register   On: 02/03/2020 07:58   DG CHEST PORT 1 VIEW  Result Date: 01/28/2020 CLINICAL DATA:  Respiratory distress. EXAM: PORTABLE CHEST 1 VIEW COMPARISON:  Chest x-ray dated January 25, 2020. FINDINGS: Unchanged endotracheal and enteric tubes. Unchanged left internal jugular central venous catheter. Stable cardiomegaly. Improved pulmonary edema. Unchanged retrocardiac left lower lobe opacity. Decreased small bilateral pleural effusions. No pneumothorax. No acute osseous abnormality. IMPRESSION: 1. Improved pulmonary edema and decreased small bilateral pleural effusion. 2. Unchanged retrocardiac left lower lobe atelectasis versus infiltrate. Electronically Signed   By: Titus Dubin M.D.   On: 01/28/2020 12:54   DG CHEST PORT 1 VIEW  Result Date: 01/25/2020 CLINICAL DATA:  Respiratory distress EXAM: PORTABLE CHEST 1 VIEW COMPARISON:  January 24, 2020 FINDINGS: Stable cardiomegaly. The hila and mediastinum are unchanged. The ETT is in good position. The NG tube terminates below today's film. No pneumothorax. Persistent opacity in the left base, stable. Persistent diffuse interstitial opacities, likely edema, stable. The left central line is in good position and unchanged. IMPRESSION: 1. Cardiomegaly with persistent edema. 2. Opacity in left base may represent a small effusion with atelectasis. An underlying infiltrate/pneumonia is not excluded. Recommend clinical correlation and attention on follow-up. 3. Support apparatus as above. Electronically Signed   By: Dorise Bullion III M.D   On: 01/25/2020 12:48   DG CHEST PORT 1 VIEW  Result Date: 01/24/2020 CLINICAL DATA:  Intubation EXAM: PORTABLE CHEST 1 VIEW COMPARISON:  Same day chest radiograph FINDINGS: Endotracheal tube terminates 2.5 cm  above the carina. Enteric tube courses below the diaphragm with distal tip beyond the inferior margin of the film. Left IJ central venous catheter terminates within the SVC, unchanged. Stable cardiomegaly. Pulmonary vascular congestion. Increasing bilateral pleural effusions. Hazy bibasilar opacities, also slightly increased. No pneumothorax identified. IMPRESSION: 1. Increasing bilateral pleural effusions and bibasilar opacities, likely reflecting atelectasis. Underlying infection not excluded. 2. Stable support apparatus. Electronically Signed   By: Davina Poke D.O.   On: 01/24/2020 14:06   DG CHEST PORT 1 VIEW  Result Date: 01/24/2020 CLINICAL DATA:  Endotracheal tube placement. EXAM: PORTABLE CHEST 1 VIEW COMPARISON:  01/20/2020 FINDINGS: Endotracheal tube has tip 3.4 cm above the carina. Enteric tube courses into the region of the distal esophagus and off the film as tip is not visualized. Left IJ central venous catheter unchanged with tip over the SVC. Lungs are adequately inflated with hazy opacification over the mid to lower lungs bilaterally with interval improved aeration of the right mid to lower lung. Findings suggesting left pleural effusion unchanged. Stable cardiomegaly. Mild hazy prominence of the central pulmonary vasculature. Remainder the exam is unchanged. IMPRESSION: 1.  Stable cardiomegaly with suggestion of mild vascular congestion. 2. Interval improvement hazy opacification over the right mid to lower lung and stable hazy opacification over the left base/retrocardiac region. Likely left pleural effusion unchanged. 3.  Tubes and lines as described. Electronically Signed   By: Marin Olp M.D.   On: 01/24/2020 08:41   DG Chest Port 1 View  Result Date: 01/20/2020 CLINICAL DATA:  Hypoxia.  Ventilator EXAM: PORTABLE CHEST 1 VIEW COMPARISON:  Yesterday FINDINGS: Endotracheal tube tip is 2.5 cm above the carina. The enteric tube reaches the stomach. Left-sided dialysis catheter with  tip at the SVC. Cardiomegaly and diffuse hazy pulmonary opacity which appears progressed. No visible effusion or pneumothorax. IMPRESSION: 1. Stable hardware positioning. 2. Increased hazy pulmonary opacity, likely edema.  Electronically Signed   By: Monte Fantasia M.D.   On: 01/20/2020 06:38   DG CHEST PORT 1 VIEW  Result Date: 01/07/2020 CLINICAL DATA:  Central line placement. EXAM: PORTABLE CHEST 1 VIEW COMPARISON:  January 19, 2020. FINDINGS: Stable cardiomegaly. Endotracheal and nasogastric tubes are unchanged in position. Interval placement of left internal jugular catheter with distal tip in expected position of the SVC. No pneumothorax is noted. Mild bilateral lung opacities are noted concerning for edema or possibly atelectasis. Small left pleural effusion is noted. Bony thorax is unremarkable. IMPRESSION: Interval placement of left internal jugular catheter with distal tip in expected position of the SVC. No pneumothorax. Mild bilateral lung opacities are noted concerning for edema or possibly atelectasis. Electronically Signed   By: Marijo Conception M.D.   On: 01/23/2020 18:19   DG Chest Port 1 View  Result Date: 02/03/2020 CLINICAL DATA:  Intubation.  CPR EXAM: PORTABLE CHEST 1 VIEW COMPARISON:  09/30/2019 FINDINGS: Endotracheal tube with tip 2 cm above the carina. The enteric tube reaches the stomach. Extensive bilateral airspace disease which could be pulmonary edema, aspiration, or pneumonia. Cardiac enlargement. No visible effusion or air leak. Upper mediastinal widening compared to before with biapical pleural based thickening which is more prominent on the left. These results were called by telephone at the time of interpretation on 01/24/2020 at 6:00 am to provider IVA KNAPP , who verbally acknowledged these results. IMPRESSION: 1. Endotracheal tube tip is 2 cm above the carina. 2. Extensive airspace disease which could be edema, aspiration, or pneumonia. 3. Vascular pedicle widening and  pleural base thickening at the left more than right apex. Findings could be related to portable technique but recommend CT when clinically possible to exclude underlying acute aortic syndrome. Electronically Signed   By: Monte Fantasia M.D.   On: 01/27/2020 06:06   DG Abd Portable 1V  Result Date: 01/24/2020 CLINICAL DATA:  Gastric tube placement EXAM: PORTABLE ABDOMEN - 1 VIEW COMPARISON:  01/12/2020 FINDINGS: gastric tube extends to the decompressed stomach, kinked at the proximal side hole. Visualized bowel gas pattern unremarkable. Cholecystectomy clips. Aortic Atherosclerosis (ICD10-170.0). IMPRESSION: Gastric tube to the decompressed stomach. Electronically Signed   By: Lucrezia Europe M.D.   On: 01/24/2020 14:13   DG Abd Portable 1V  Result Date: 01/22/2020 CLINICAL DATA:  Enteric tube placement. EXAM: PORTABLE ABDOMEN - 1 VIEW COMPARISON:  CT abdomen pelvis dated April 19, 2019. FINDINGS: Enteric tube and small amount of contrast within the stomach. The bowel gas pattern is normal. No radio-opaque calculi or other significant radiographic abnormality are seen. Prior cholecystectomy. No acute osseous abnormality. IMPRESSION: 1. Enteric tube in the stomach. Electronically Signed   By: Titus Dubin M.D.   On: 01/16/2020 13:37   ECHOCARDIOGRAM COMPLETE  Result Date: 01/28/2020    ECHOCARDIOGRAM REPORT   Patient Name:   Sabrina Beck Date of Exam: 01/28/2020 Medical Rec #:  [id number]      Height:       64.0 in Accession #:    [id number]     Weight:       205.7 lb Date of Birth:  [birth year]      BSA:          1.980 m Patient Age:    89 years       BP:           85/42 mmHg Patient Gender: F              HR:  87 bpm. Exam Location:  Inpatient Procedure: 2D Echo Indications:    Atrial Fibrillation I48.91; Shock; acute dissection of the                 thoracic aorta extending from the distal arch throughout                 descending                 thoracic aorta  History:        Patient has  prior history of Echocardiogram examinations, most                 recent 09/27/2019. Arrythmias:Atrial Fibrillation; Risk                 Factors:Dyslipidemia and Hypertension.  Sonographer:    Mikki Santee RDCS (AE) Referring Phys: [id number] Benson  1. Left ventricular ejection fraction, by estimation, is 50 to 55%. The left ventricle has low normal function. The left ventricle demonstrates regional wall motion abnormalities (see scoring diagram/findings for description). There is mild concentric left ventricular hypertrophy. Left ventricular diastolic parameters are consistent with Grade II diastolic dysfunction (pseudonormalization). There is severe hypokinesis of the left ventricular, mid anteroseptal wall.  2. Right ventricular systolic function is severely reduced. The right ventricular size is normal. There is mildly elevated pulmonary artery systolic pressure.  3. Left atrial size was moderately dilated.  4. Right atrial size was mildly dilated.  5. The mitral valve is grossly normal. No evidence of mitral valve regurgitation.  6. The aortic valve is grossly normal. Aortic valve regurgitation is trivial. FINDINGS  Left Ventricle: Left ventricular ejection fraction, by estimation, is 50 to 55%. The left ventricle has low normal function. The left ventricle demonstrates regional wall motion abnormalities. Severe hypokinesis of the left ventricular, mid anteroseptal  wall. Moderate dyskinesis of the left ventricular, basal-mid inferior wall. The left ventricular internal cavity size was normal in size. There is mild concentric left ventricular hypertrophy. Left ventricular diastolic parameters are consistent with Grade II diastolic dysfunction (pseudonormalization). Right Ventricle: The right ventricular size is normal. Right vetricular wall thickness was not assessed. Right ventricular systolic function is severely reduced. There is mildly elevated pulmonary artery systolic pressure. The  tricuspid regurgitant velocity is 2.55 m/s, and with an assumed right atrial pressure of 15 mmHg, the estimated right ventricular systolic pressure is 09.4 mmHg. Left Atrium: Left atrial size was moderately dilated. Right Atrium: Right atrial size was mildly dilated. Pericardium: There is no evidence of pericardial effusion. Mitral Valve: The mitral valve is grossly normal. No evidence of mitral valve regurgitation. Tricuspid Valve: The tricuspid valve is grossly normal. Tricuspid valve regurgitation is trivial. No evidence of tricuspid stenosis. Aortic Valve: The aortic valve is grossly normal. Aortic valve regurgitation is trivial. There is mild calcification of the aortic valve. Aortic valve mean gradient measures 12.0 mmHg. Aortic valve peak gradient measures 23.7 mmHg. Aortic valve area, by VTI measures 2.30 cm. Pulmonic Valve: The pulmonic valve was not well visualized. Pulmonic valve regurgitation is not visualized. Aorta: The aortic root and ascending aorta are structurally normal, with no evidence of dilitation. IAS/Shunts: The atrial septum is grossly normal.  LEFT VENTRICLE PLAX 2D LVIDd:         4.40 cm LVIDs:         2.80 cm LV PW:         1.40 cm LV IVS:  1.20 cm LVOT diam:     2.80 cm LV SV:         97 LV SV Index:   49 LVOT Area:     6.16 cm  RIGHT VENTRICLE TAPSE (M-mode): 0.6 cm LEFT ATRIUM             Index       RIGHT ATRIUM           Index LA diam:        3.40 cm 1.72 cm/m  RA Area:     20.30 cm LA Vol (A2C):   75.2 ml 37.98 ml/m RA Volume:   62.50 ml  31.57 ml/m LA Vol (A4C):   92.9 ml 46.92 ml/m LA Biplane Vol: 90.6 ml 45.76 ml/m  AORTIC VALVE AV Area (Vmax):    2.09 cm AV Area (Vmean):   2.19 cm AV Area (VTI):     2.30 cm AV Vmax:           243.33 cm/s AV Vmean:          162.000 cm/s AV VTI:            0.420 m AV Peak Grad:      23.7 mmHg AV Mean Grad:      12.0 mmHg LVOT Vmax:         82.50 cm/s LVOT Vmean:        57.700 cm/s LVOT VTI:          0.157 m LVOT/AV VTI ratio:  0.37  AORTA Ao Root diam: 3.30 cm MR Peak grad: 82.4 mmHg   TRICUSPID VALVE MR Vmax:      454.00 cm/s TR Peak grad:   26.0 mmHg                           TR Vmax:        255.00 cm/s                            SHUNTS                           Systemic VTI:  0.16 m                           Systemic Diam: 2.80 cm Mertie Moores MD Electronically signed by Mertie Moores MD Signature Date/Time: 01/28/2020/4:18:59 PM    Final    CT Angio Chest/Abd/Pel for Dissection W and/or W/WO  Result Date: 01/28/2020 CLINICAL DATA:  Inpatient.  Shock.  Post code.  Left-sided bruising. EXAM: CT ANGIOGRAPHY CHEST, ABDOMEN AND PELVIS TECHNIQUE: Non-contrast CT of the chest was initially obtained. Multidetector CT imaging through the chest, abdomen and pelvis was performed using the standard protocol during bolus administration of intravenous contrast. Multiplanar reconstructed images and MIPs were obtained and reviewed to evaluate the vascular anatomy. CONTRAST:  113mL OMNIPAQUE IOHEXOL 350 MG/ML SOLN COMPARISON:  Chest radiograph from earlier today. 04/20/2019 chest CT. 04/19/2019 CT abdomen/pelvis. FINDINGS: CTA CHEST FINDINGS Cardiovascular: Mild cardiomegaly. New small hemorrhagic pericardial effusion. Three-vessel coronary atherosclerosis. There is acute intramural hemorrhage throughout the wall of ascending thoracic aorta on the precontrast sequence. 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 Aortic arch branch vessels are patent. Normal caliber pulmonary arteries. No central pulmonary emboli. Left internal jugular central venous catheter terminates in upper third of the SVC. Mediastinum/Nodes: Subcentimeter hypodense partially calcified left thyroid isthmus nodule. Not clinically significant; no  follow-up imaging recommended (ref: J Am Coll Radiol. 2015 Feb;12(2): 143-50). Enteric tube terminates in the proximal stomach. No pathologically enlarged axillary, mediastinal or hilar lymph nodes. Lungs/Pleura: No pneumothorax. Small dependent bilateral pleural effusions, left greater than right. Endotracheal tube tip is 2.4 cm above the carina. Occluded left lower lobe bronchus with low attenuation debris with complete left lower lobe atelectasis. Mild passive dependent right lower lobe atelectasis. Patchy mild ground-glass opacity throughout both lungs. No discrete lung masses or significant pulmonary nodules. Musculoskeletal: No aggressive appearing focal osseous lesions. Minimally displaced anterior second through fifth right rib fractures. Minimally displaced anterior left second through sixth rib fractures. Mild thoracic spondylosis. Comminuted minimally displaced mid sternal fracture. Review of the MIP images confirms the above findings. CTA ABDOMEN AND PELVIS FINDINGS VASCULAR Aorta: Descending thoracic aortic acute dissection extends into the suprarenal abdominal aorta. Abdominal aorta is nonaneurysmal. Celiac: Patent. SMA: Moderate proximal atherosclerotic stenosis, approximately 50%. Renals: Diminutive bilateral renal arteries with suggestion of high-grade proximal stenoses bilaterally. IMA: Patent without evidence of aneurysm, dissection, vasculitis or significant stenosis. Inflow: Patent without evidence of aneurysm, dissection, vasculitis or significant stenosis. Veins: No obvious venous abnormality within the limitations of this arterial phase study. Review of the MIP images confirms the above findings. NON-VASCULAR Hepatobiliary: Normal liver with no liver mass. Cholecystectomy. No biliary ductal dilatation. Pancreas: Normal, with no mass or duct dilation. Spleen: Normal size. No mass. Adrenals/Urinary Tract: Normal adrenals. Severe symmetric bilateral renal atrophy with no hydronephrosis. Small  hypodense bilateral renal cortical lesions, largest 1.5 cm in the posterior interpolar right kidney (series 7/image 156), not appreciably changed since 04/19/2019 CT, probably benign complex cysts. Normal collapse bladder. Stomach/Bowel: Enteric tube terminates in proximal stomach. Stomach is nondistended and otherwise normal. Normal caliber small bowel with no small bowel wall thickening. Normal appendix. Moderate sigmoid diverticulosis with no large bowel wall thickening or significant pericolonic fat stranding. Rectal drainage tube in place. Vascular/Lymphatic: No pathologically enlarged lymph nodes in the abdomen or pelvis. Reproductive: Status post hysterectomy, with no abnormal findings at the vaginal cuff. No adnexal mass. Other: No pneumoperitoneum, ascites or focal fluid collection. Musculoskeletal: No aggressive appearing focal osseous lesions. Mild lumbar spondylosis. Review of the MIP images confirms the above findings. IMPRESSION: 1. 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 Acute intramural hemorrhage throughout the ascending thoracic aorta with new small hemorrhagic pericardial effusion. 2. 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 3. Small dependent bilateral pleural effusions, left greater than right. 4. Occluded left lower lobe bronchus with low attenuation debris (probably mucous plugging) with complete left lower lobe atelectasis. 5. Cardiomegaly. Patchy mild ground-glass opacity throughout both lungs, nonspecific, favor mild pulmonary edema. 6. Anterior multilevel bilateral rib fractures with comminuted sternal fracture. No pneumothorax. 7. Well-positioned endotracheal and enteric tubes and left internal jugular central venous catheter. 8. Moderate sigmoid diverticulosis. 9. Severe symmetric bilateral renal atrophy. 10. Aortic  Atherosclerosis (ICD10-I70.0). Critical Value/emergent results were called by telephone at the time of interpretation on 01/28/2020 at 1:49 pm to provider Noemi Chapel , who verbally acknowledged these results. Electronically Signed   By: Ilona Sorrel  M.D.   On: 01/28/2020 13:50    Microbiology: No results found for this or any previous visit (from the past 240 hour(s)).   Labs: Basic Metabolic Panel: Recent Labs  Lab 29-Feb-2020 0615  NA 137  K 4.0  CL 94*  CO2 25  GLUCOSE 140*  BUN 40*  CREATININE 3.08*  CALCIUM 10.4*  PHOS 7.7*   Liver Function Tests: Recent Labs  Lab 2020/02/29 0615  ALBUMIN 2.7*   No results for input(s): LIPASE, AMYLASE in the last 168 hours. No results for input(s): AMMONIA in the last 168 hours. CBC: Recent Labs  Lab 29-Feb-2020 0500  WBC 12.3*  NEUTROABS 9.0*  HGB 9.4*  HCT 30.5*  MCV 113.0*  PLT 191   Cardiac Enzymes: No results for input(s): CKTOTAL, CKMB, CKMBINDEX, TROPONINI in the last 168 hours. D-Dimer No results for input(s): DDIMER in the last 72 hours. BNP: Invalid input(s): POCBNP CBG: Recent Labs  Lab 02/06/20 1618 02/06/20 2041 02/06/20 2354 02-29-2020 0510  GLUCAP 148* 140* 159* 126*   Anemia work up No results for input(s): VITAMINB12, FOLATE, FERRITIN, TIBC, IRON, RETICCTPCT in the last 72 hours. Urinalysis    Component Value Date/Time   COLORURINE YELLOW 03/03/2012 1500   APPEARANCEUR CLEAR 03/03/2012 1500   LABSPEC 1.020 03/03/2012 1500   PHURINE 6.0 03/03/2012 1500   GLUCOSEU 100 (A) 03/03/2012 1500   HGBUR TRACE (A) 03/03/2012 1500   BILIRUBINUR NEGATIVE 03/03/2012 1500   KETONESUR NEGATIVE 03/03/2012 1500   PROTEINUR 100 (A) 03/03/2012 1500   UROBILINOGEN 0.2 03/03/2012 1500   NITRITE NEGATIVE 03/03/2012 1500   LEUKOCYTESUR NEGATIVE 03/03/2012 1500   Sepsis Labs Invalid input(s): PROCALCITONIN,  WBC,  LACTICIDVEN     SIGNED:  Shelly Coss, MD  [facility] 02/13/2020, 3:16 PM Pager  [phone]  If 7PM-7AM, please contact night-coverage [email or web address] Password [email or web address]

## 2020-03-07 NOTE — Progress Notes (Signed)
Remaining IV dilaudid was wasted in steri-cycle and was witnessed by Nona Dell, RN.

## 2020-03-07 NOTE — Progress Notes (Signed)
[facility] Progress Note  Subjective: recurrent resp distress this am, back on HD again  Vitals:   2020-03-05 0715 03/05/20 0730 03-05-2020 0800 03-05-2020 0830  BP: (!) 128/56 (!) 121/45 (!) 94/44 (!) 86/34  Pulse: 82 88 80 82  Resp: (!) 27 20 (!) 27 (!) 25  Temp:      TempSrc:      SpO2:   100%   Weight:      Height:        Exam: Gen seen in HD, on FM O2 No jvd or bruits Chest mostly clear bilat RRR no MRG Abd soft ntnd no mass or ascites +bs Ext 1-2+ UE edema  Neuro responsive LFA AVF+bruit   CXR 6/29 - basilar atx, no edema or pvc   OP HD: [facility] MWF  3.5h  2/2.5 bath  97.5kg  No heparin  LFA AVF  - EPO 6600 q Rx   Assessment/ Plan: 1. Cardiac arrest - resuscitated in ED 2. Sepsis/ shock/ PNA - better, on IV abx, on midodrine 10 tid 3. Type B aortic dissection - felt due to CPR, seen by TCTS > med Rx 4. Acute resp failure - recurrent issue, better after HD yest but worse again overnight.  5. BP/ volume - unable to get much volume off due to low BP's on HD 6. ESRD - on HD MWF.  SP CRRT 6/15 - 6.29. Extra HD today for volume.  7. MBD ckd -  Ca up slightly. P repleted 6/22 8. Anemia ckd - Hb 9.1 today, transfuse prn.  darbe 100ug  weekly while here. Tsat 89%, no need for IV Fe. 9. Atrial fibrillation - getting metoprolol 25 bid for rate control 10. Hypercalcemia-  Developed in house-  Maybe due to being immobility, no need Rx yet 11. Code status / full DNR- spoke w/ pt this am about full DNR and she agrees. See separate note.  Prognosis very poor, have d/w family.    Kelly Splinter, MD 03/05/2020, 9:12 AM       Recent Labs  Lab 02/04/20 0504 02/05/20 0602 02/06/20 0330 03-05-20 0500 2020/03/05 0615  K 4.4   < > 4.3  --  4.0  BUN 56*   < > 43*  --  40*  CREATININE 3.13*   < > 3.29*  --  3.08*  CALCIUM 11.0*   < > 10.0  --  10.4*  PHOS 3.8  --   --   --  7.7*  HGB 8.1*   < > 8.5* 9.4*  --    < > = values in this interval not  displayed.   Inpatient medications: . B-complex with vitamin C  1 tablet Per Tube Daily  . Chlorhexidine Gluconate Cloth  6 each Topical Q0600  . Chlorhexidine Gluconate Cloth  6 each Topical Q0600  . darbepoetin (ARANESP) injection - DIALYSIS  100 mcg Intravenous Q Wed-HD  . feeding supplement (PRO-STAT SUGAR FREE 64)  30 mL Per Tube Daily  . fludrocortisone  0.1 mg Per Tube Daily  . hydrocortisone sod succinate (SOLU-CORTEF) inj  50 mg Intravenous Q8H  . insulin aspart  0-6 Units Subcutaneous Q4H  . levothyroxine  75 mcg Per Tube Q0600  . mouth rinse  15 mL Mouth Rinse BID  . metoprolol tartrate  25 mg Per Tube BID  . midodrine  10 mg Per Tube TID  . pantoprazole sodium  40 mg Per Tube Daily  . sodium chloride flush  10-40 mL Intracatheter  Q12H  . Warfarin - Pharmacist Dosing Inpatient   Does not apply q1600   . sodium chloride Stopped (02/04/20 1059)  . albumin human    . amiodarone 30 mg/hr (02/06/20 1228)  . feeding supplement (VITAL 1.5 CAL) Stopped (02/06/20 1830)   sodium chloride, acetaminophen (TYLENOL) oral liquid 160 mg/5 mL, heparin, ipratropium-albuterol, lip balm, polyethylene glycol, sodium chloride flush

## 2020-03-07 NOTE — Progress Notes (Signed)
Daily Progress Note   Patient Name: Sabrina Beck       Date: 02/24/20 DOB: [birth year]  Age: 79 y.o. MRN#: [id number] Attending Physician: Shelly Coss, MD Primary Care Physician: Redmond School, MD Admit Date: 01/20/2020  Reason for Consultation/Follow-up: To discuss complex medical decision making related to patient's goals of care  Communicated with Drs. Schertz and Adhikari this morning prior to meeting sons.   Discussed with RN after meeting with family.  Subjective: Met with Sabrina Beck and Sabrina Beck in front of hospital.  They are very saddened by their mother's condition and worried that she is suffering.  Above all they do not want her to suffer.  We discussed changing our goal to one of comfort and support.  I explained that [name] will likely die here in the hospital.  Her sons understand.  We discussed ways to make her more comfortable and they gave me a list of potential family visitors  - which I gave to the front desk security.  Sons want our focus to be on their mother's comfort.  They understand her prognosis is very short and we anticipate hospital death.  Assessment: Patient actively dying.  Unable to remove fluid with HD and maintain blood pressure after being admitted with respiratory and cardiac arrest.   Patient Profile/HPI:  79 y.o. 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 Failed extubation x 1 but now extubated. Also found to have Type B aortic dissection (from trauma of CPR?) and  continues with dysphagia with plans for FEES 02/05/20.   Length of Stay: 19   Vital Signs: BP (!) 91/51 (BP Location: Right Arm)   Pulse 82   Temp 97.7 F (36.5 C) (Axillary)   Resp (!) 26   Ht _0  (1.626 m)   Wt 85.9 kg   SpO2 100%   BMI 32.51 kg/m  SpO2: SpO2: 100 % O2 Device: O2 Device: NRB O2 Flow Rate: O2 Flow Rate (L/min): 6 L/min       Palliative Assessment/Data: 20%     Palliative Care  Plan    Recommendations/Plan:  DNR  Comfort Measures.  Orders changed to stop interventions that are not geared towards her comfort and initiate medications for her comfort.    Code Status:  DNR  Prognosis:   Hours - Days   Discharge Planning:  Anticipated Hospital Death   Thank you for allowing the Palliative Medicine Team to assist in the care of this patient.  Total time spent:  35 min.     Greater than 50%  of this time was spent counseling and coordinating care related to the above assessment and plan.  Florentina Jenny, PA-C Palliative Medicine  Please contact Palliative MedicineTeam phone at [phone] for questions and concerns between 7 am - 7 pm.   Please see AMION for individual provider pager numbers.

## 2020-03-07 NOTE — Progress Notes (Signed)
I spoke w/ patient about code status and if she would consider full DNR and she says that she is in agreement w/ full DNR, no intubation/ bipap/ CPR/ defib/ meds. Kalman Shan RN was present for the conversation. I spoke w/ the son [name] and he says he and his brother support this change.  Will let PCT know, she will likely need hospice type medication if resp distress reoccurs. Suspect arrest is probably iminent.   Kelly Splinter, MD 2020/02/12, 9:09 AM

## 2020-03-07 DEATH — deceased

## 2021-03-20 IMAGING — DX DG CHEST 2V
2 series · 2 of 2 positions shown · non-contrast
Comparison: 04/08/2019 and 08/18/2018

CLINICAL DATA: Shortness of breath.

EXAM:
CHEST - 2 VIEW

[chest pa]
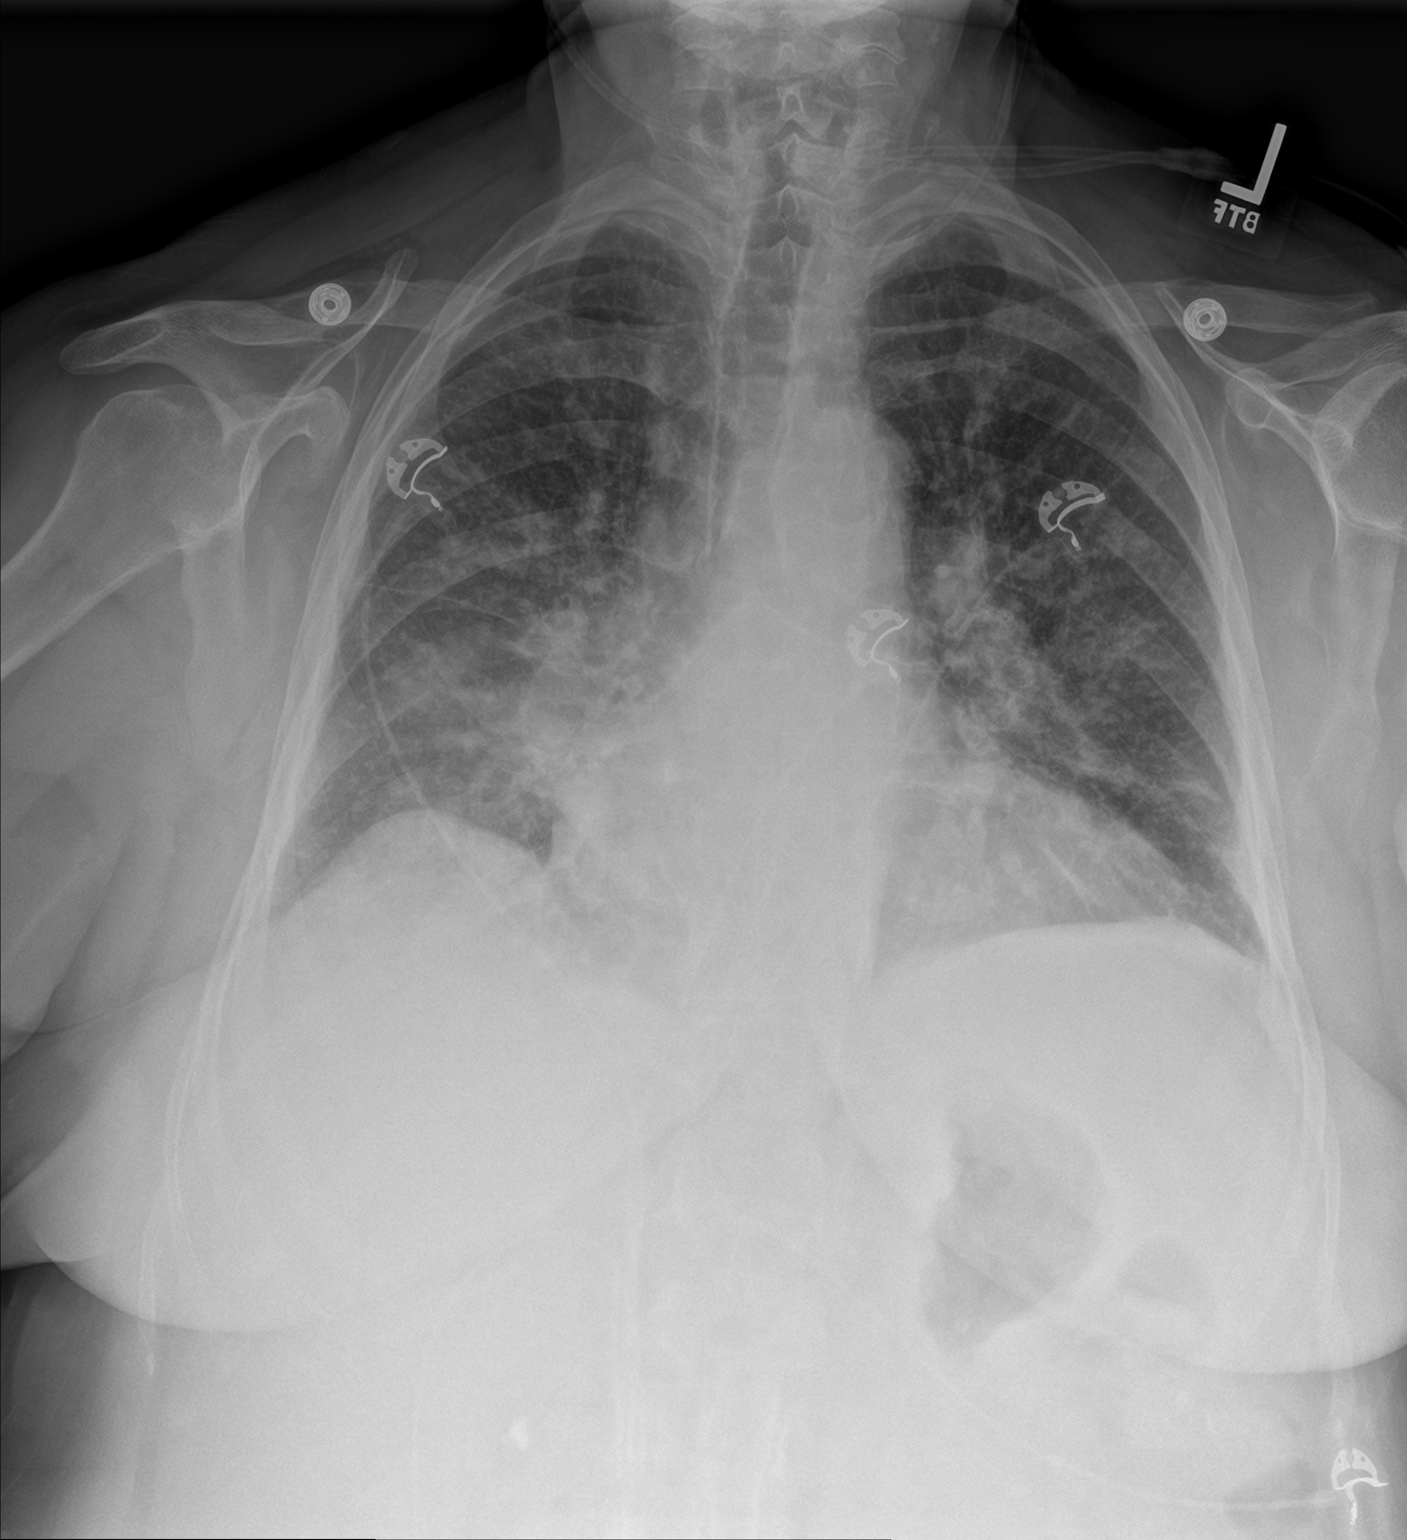

[chest lat]
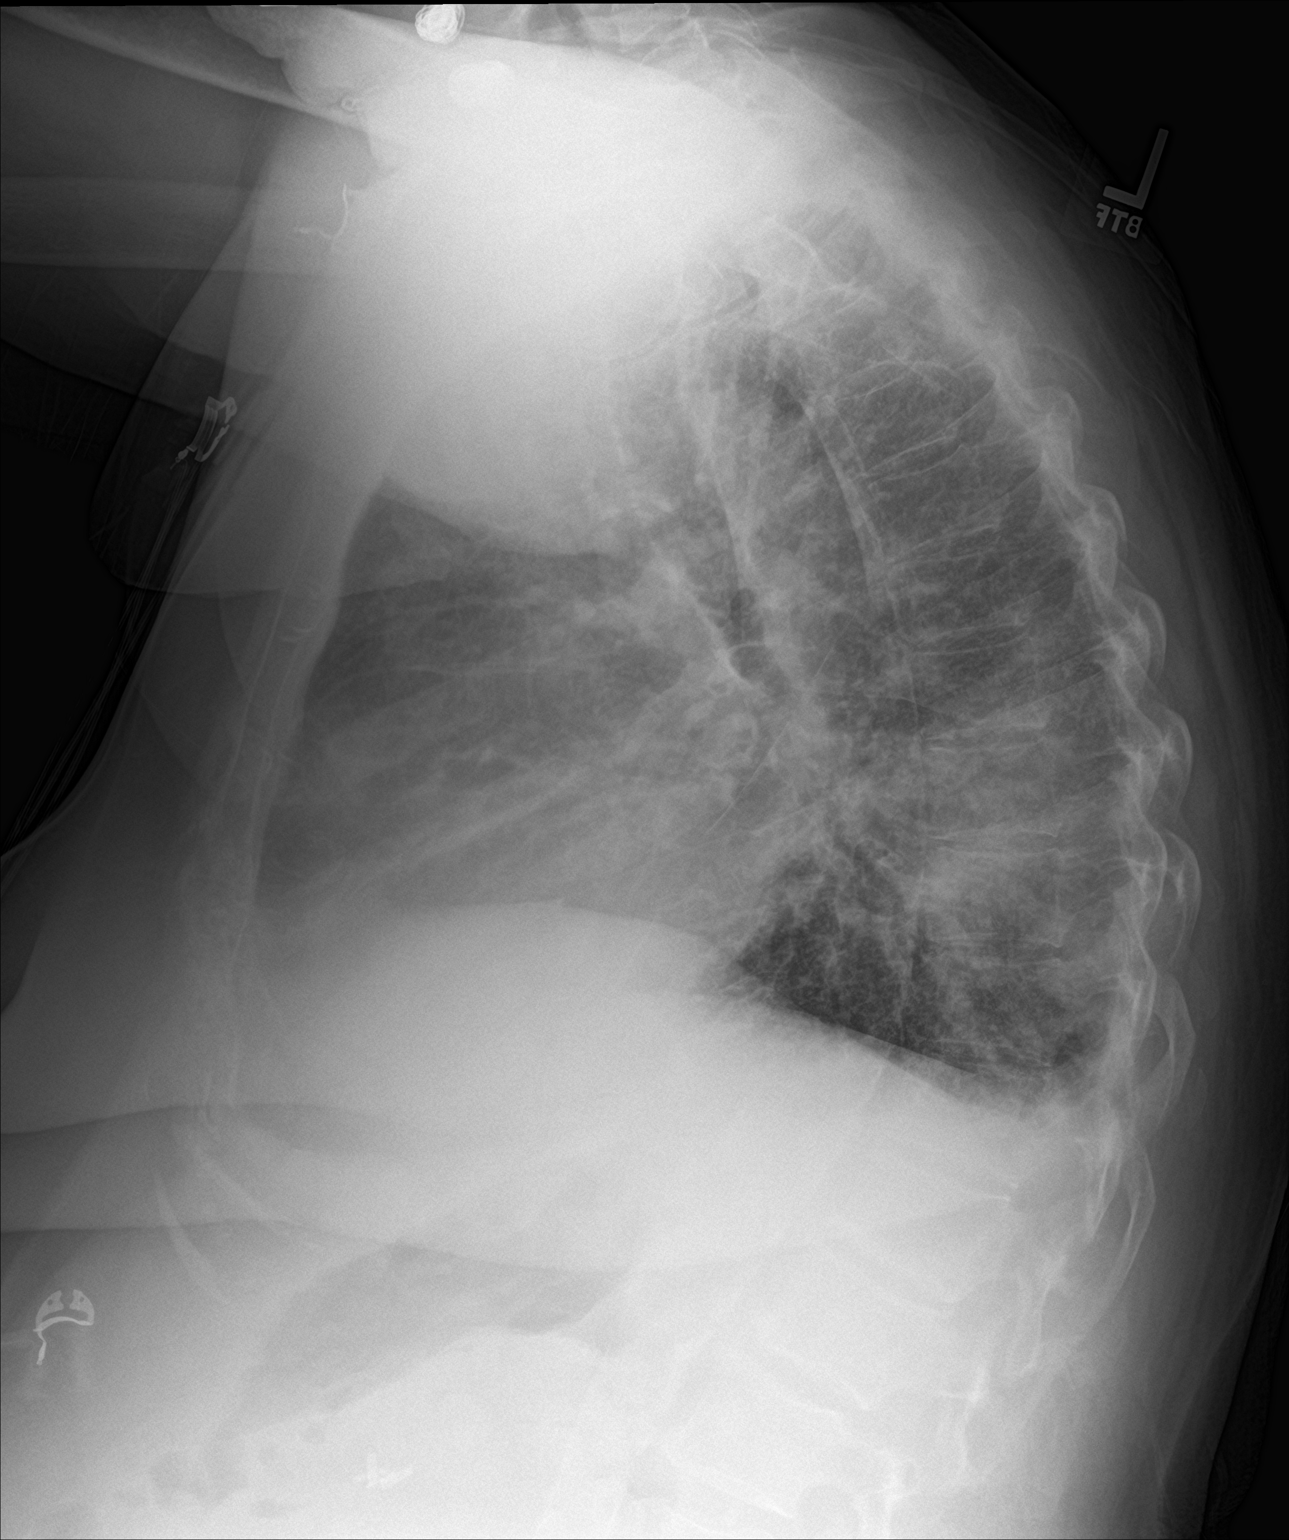

[2 of 2 positions shown; findings below may reference images not displayed]

FINDINGS: Cardiac size is within normal limits. Aortic atherosclerosis. New
pulmonary vascular congestion. New bilateral perihilar infiltrates.
Persistent hazy infiltrate at the right lung base posteriorly.
Minimal blunting of the posterior costophrenic angles. No acute bone
abnormality.
IMPRESSION: New bilateral perihilar infiltrates. Persistent infiltrate at the
right lung base. This could represent pulmonary edema or multifocal
progressive pneumonia.

Aortic Atherosclerosis (29MX4-83J.J).

## 2021-03-29 IMAGING — CT CT ABD-PELV W/O CM
2 of 4 series · 15 of 46 positions shown, 17 images · non-contrast
Comparison: None.

CLINICAL DATA: Abdominal pain, gastroenteritis or colitis suspected

EXAM:
CT ABDOMEN AND PELVIS WITHOUT CONTRAST
TECHNIQUE: Multidetector CT imaging of the abdomen and pelvis was performed
following the standard protocol without IV contrast.

[Series 2: axial st · axial · 0.92mm/px · z∈[+844,+1294]mm · 12 of 100 slices shown, 14 images]
[im 5/100  soft-tissue]
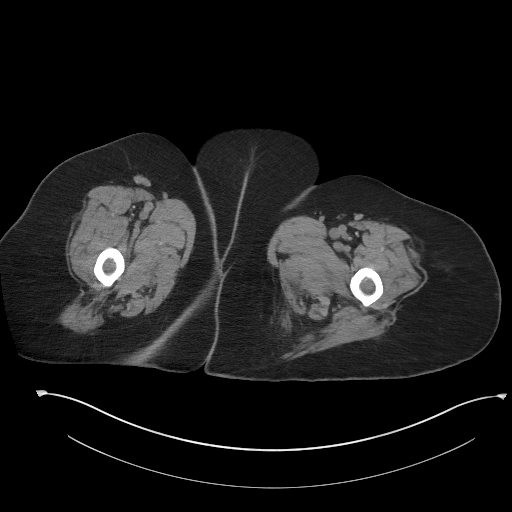
[im 5/100  bone]
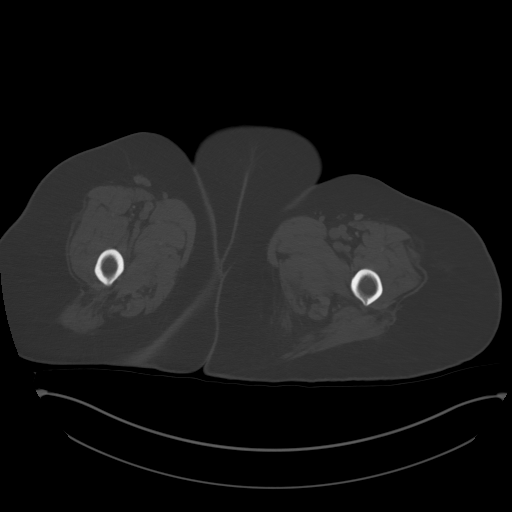
[im 15/100  soft-tissue]
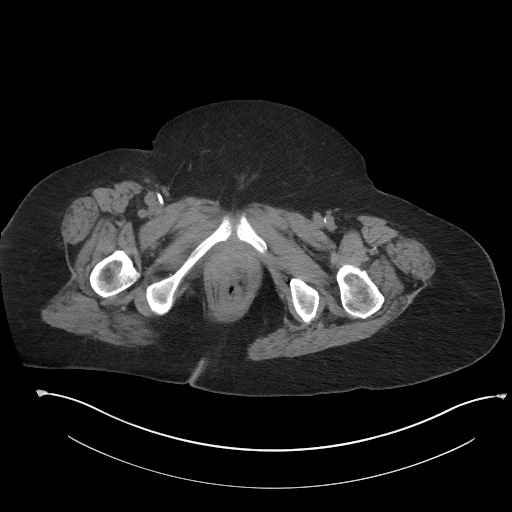
[im 20/100  soft-tissue]
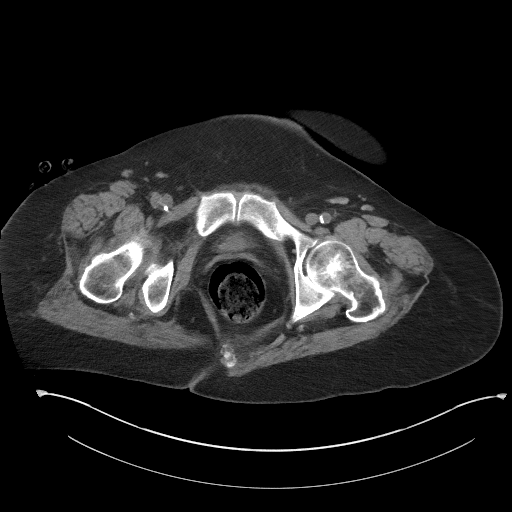
[im 30/100  soft-tissue]
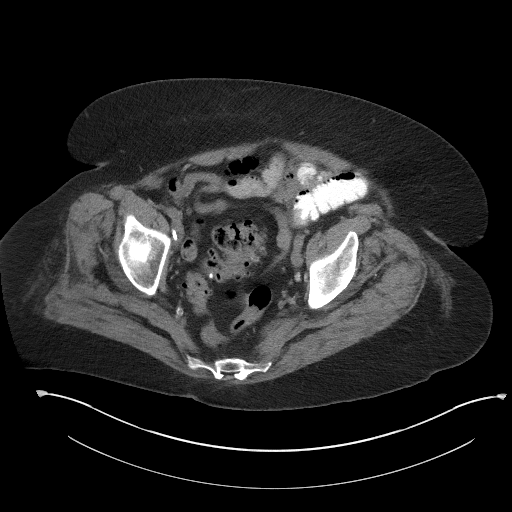
[im 40/100  soft-tissue]
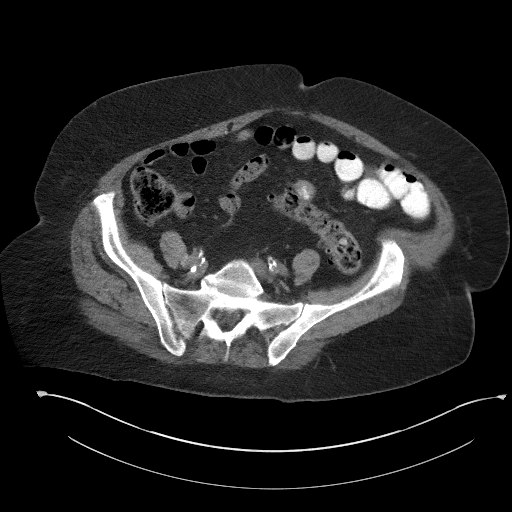
[im 45/100  soft-tissue]
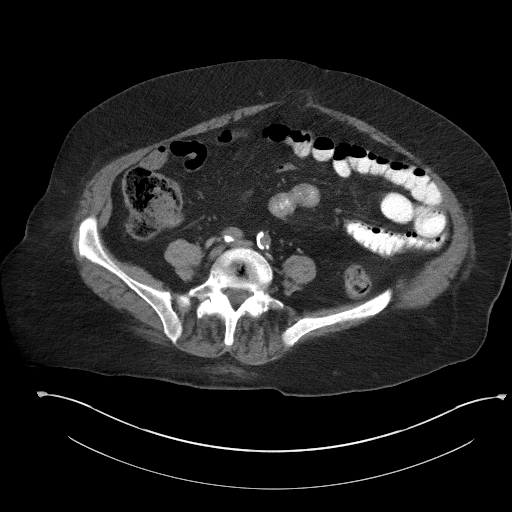
[im 55/100  soft-tissue]
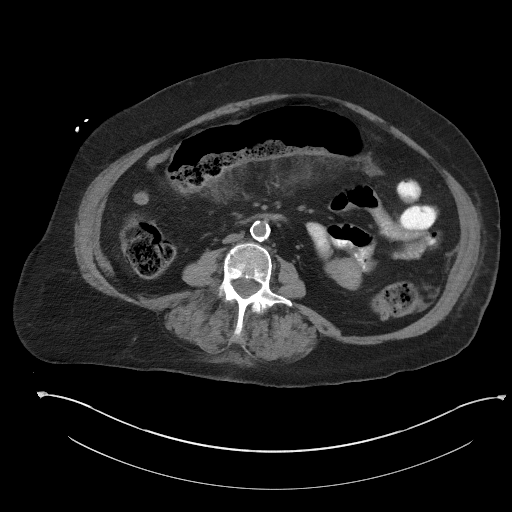
[im 60/100  soft-tissue]
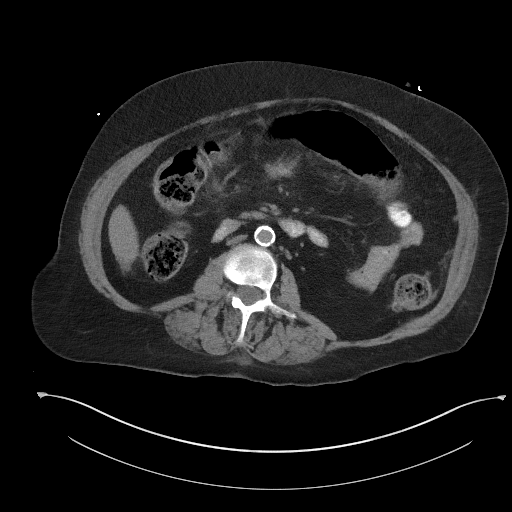
[im 70/100  soft-tissue]
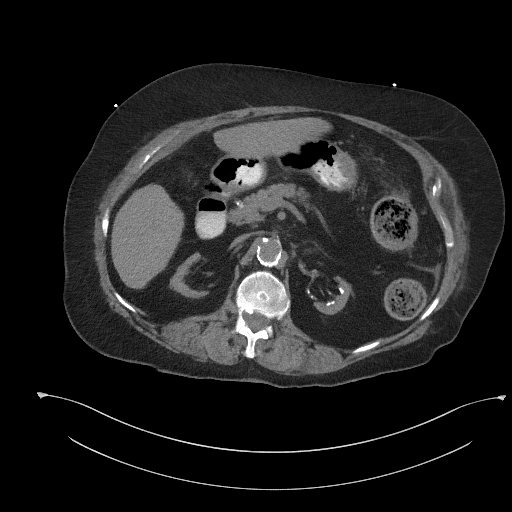
[im 70/100  bone]
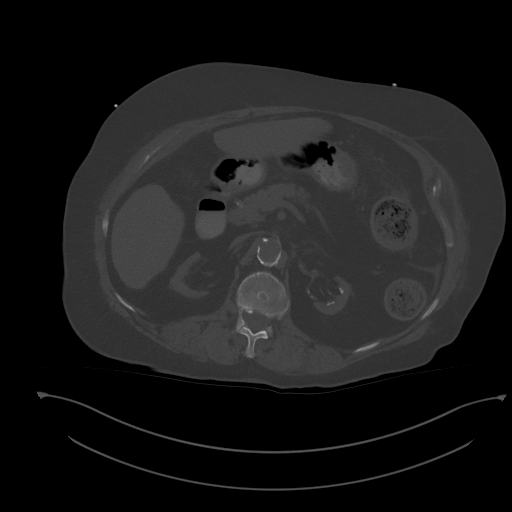
[im 80/100  soft-tissue]
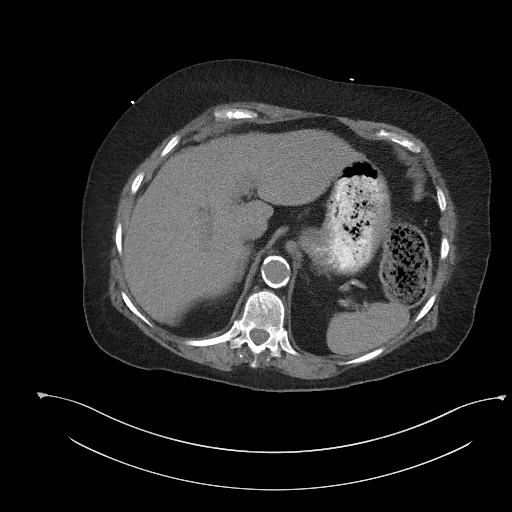
[im 85/100  soft-tissue]
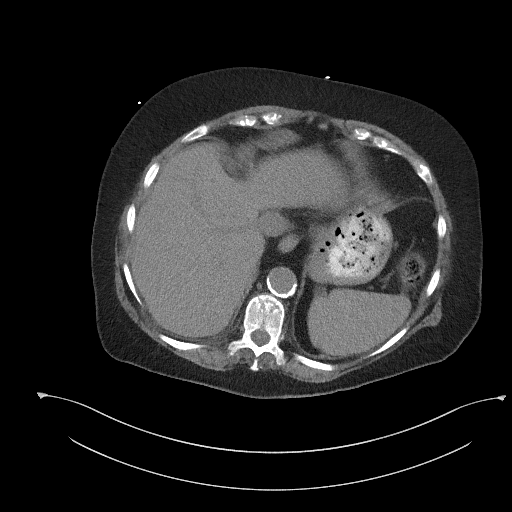
[im 95/100  soft-tissue]
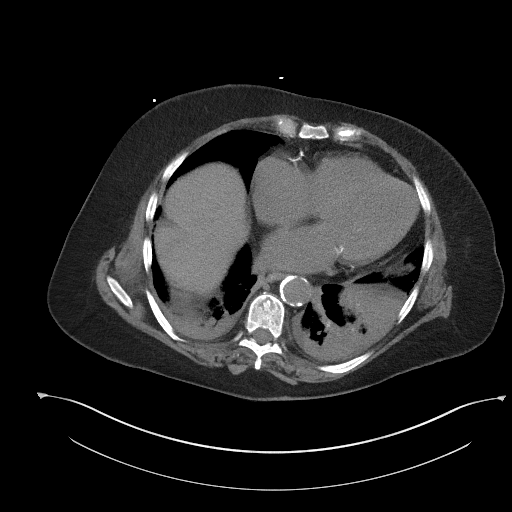

[Series 5: coronal st · coronal · 1.04mm/px · 3 of 143 slices shown]
[im 48/143  soft-tissue]
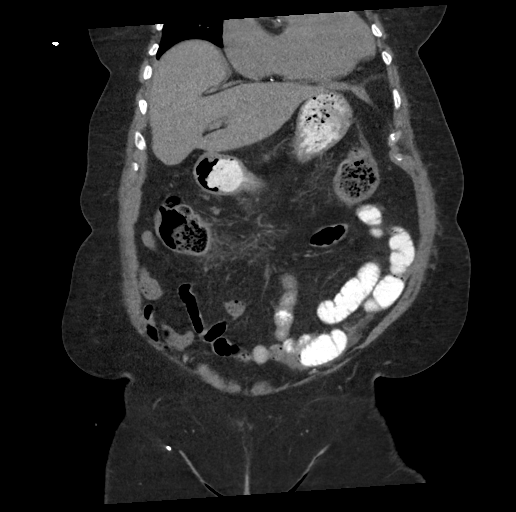
[im 64/143  soft-tissue]
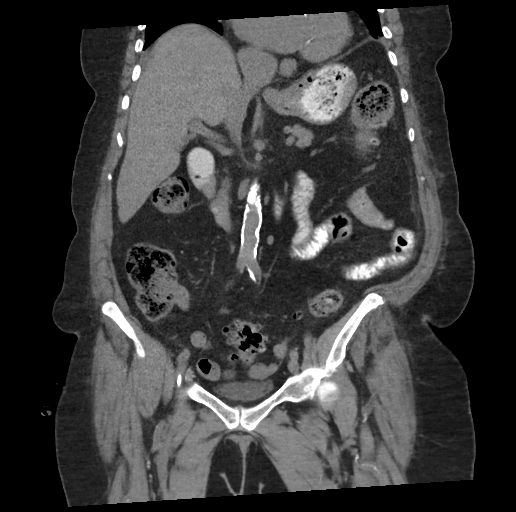
[im 79/143  soft-tissue]
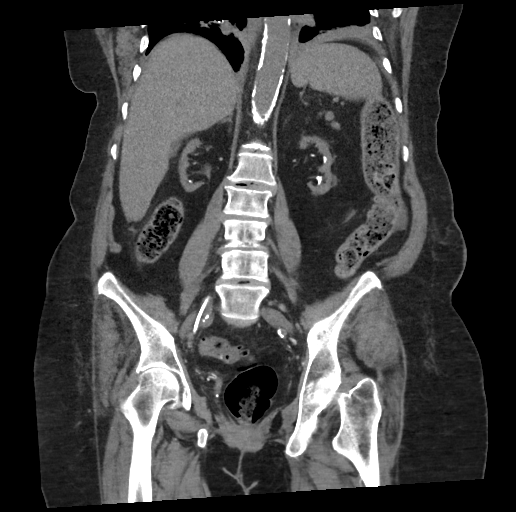

[15 of 46 positions shown; findings below may reference images not displayed]

FINDINGS: Lower chest: Multifocal consolidative opacities are present in the
lung bases. More masslike consolidation is present in the left lower
lobe. Diffuse airways thickening is noted in the bases. Dependent
atelectatic changes noted posteriorly. Trace effusions are present
as well. There is cardiomegaly with biatrial enlargement.
Atherosclerotic calcification of the coronary arteries.
Atherosclerotic calcifications are present upon the aortic leaflets
and within the mitral valve annulus.

Hepatobiliary: No focal liver abnormality is seen. Patient is post
cholecystectomy. Slight prominence of the biliary tree likely
related to reservoir effect. No calcified intraductal gallstones.

Pancreas: Unremarkable. No pancreatic ductal dilatation or
surrounding inflammatory changes.

Spleen: Normal in size without focal abnormality.

Adrenals/Urinary Tract: Normal adrenal glands. Severe atrophy of
both kidneys with multiple vascular calcifications in the renal hila
and few lobular subcentimeter renal lesions likely reflecting cysts.
These are too small to fully characterize on CT imaging but
statistically likely benign.

Stomach/Bowel: Distal esophagus, stomach and duodenal sweep are
unremarkable. No bowel wall thickening or dilatation. No evidence of
obstruction. A normal appendix is visualized. There is segmental
thickening and adjacent pericolonic hazy mesenteric stranding
involving the transverse colon and proximal descending colon. There
are numerous pancolonic diverticula however inflammation is not
appear centered upon a focal culprit diverticulum. No resulting
obstruction.

Vascular/Lymphatic: Severe atherosclerotic calcification. Evaluation
of vascular patency is limited by the absence of intravenous
contrast. No suspicious or enlarged lymph nodes in the included
lymphatic chains.

Reproductive: Uterus is surgically absent. No concerning adnexal
lesions.

Other: No abdominopelvic free fluid or free gas. No bowel containing
hernias. Fat containing ventral/umbilical hernia. Anterior abdominal
wall laxity. Small amount of soft tissue stranding/edema along the
left lateral abdominal wall.

Musculoskeletal: Multilevel degenerative changes are present in the
imaged portions of the spine.
IMPRESSION: 1. Segmental thickening and adjacent pericolonic hazy mesenteric
stranding involving the transverse colon and proximal descending
colon, consistent with colitis, which could be infectious,
inflammatory or potentially vascular in etiology. Evaluation of the
vasculature is limited in the absence of contrast media.
2. Multifocal areas of airspace opacity in both lower lobes. More
masslike consolidation is present in the left lung base. Recommend
reimaging following therapeutic intervention to ensure resolution.
3. Extensive pancolonic diverticulosis without evidence of acute
diverticulitis.
4. Bilateral renal atrophy. Scattered cystic lesions in both
kidneys, too small characterize on CT imaging.
5. Small fat containing ventral hernia.
6. Aortic Atherosclerosis ([id number]).
